# Patient Record
Sex: Female | Born: 1985
Health system: Southern US, Community
[De-identification: ages and names within clinical notes are randomized; demographics above are authoritative.]

## PROBLEM LIST (undated history)

## (undated) ENCOUNTER — Inpatient Hospital Stay (HOSPITAL_COMMUNITY): Payer: Self-pay

## (undated) DIAGNOSIS — D573 Sickle-cell trait: Secondary | ICD-10-CM

## (undated) DIAGNOSIS — O0990 Supervision of high risk pregnancy, unspecified, unspecified trimester: Secondary | ICD-10-CM

## (undated) DIAGNOSIS — E119 Type 2 diabetes mellitus without complications: Secondary | ICD-10-CM

## (undated) DIAGNOSIS — I639 Cerebral infarction, unspecified: Secondary | ICD-10-CM

## (undated) HISTORY — PX: TONSILLECTOMY: SUR1361

## (undated) HISTORY — PX: WISDOM TOOTH EXTRACTION: SHX21

## (undated) HISTORY — DX: Supervision of high risk pregnancy, unspecified, unspecified trimester: O09.90

## (undated) HISTORY — DX: Cerebral infarction, unspecified: I63.9

---
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
## 2005-04-27 ENCOUNTER — Emergency Department (HOSPITAL_COMMUNITY): Admission: EM | Admit: 2005-04-27 | Discharge: 2005-04-27 | Payer: Self-pay | Admitting: Emergency Medicine
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
## 2005-09-16 ENCOUNTER — Emergency Department (HOSPITAL_COMMUNITY): Admission: EM | Admit: 2005-09-16 | Discharge: 2005-09-16 | Payer: Self-pay | Admitting: Emergency Medicine

## 2005-11-04 ENCOUNTER — Emergency Department (HOSPITAL_COMMUNITY): Admission: EM | Admit: 2005-11-04 | Discharge: 2005-11-04 | Payer: Self-pay | Admitting: Emergency Medicine

## 2006-06-03 ENCOUNTER — Emergency Department (HOSPITAL_COMMUNITY): Admission: EM | Admit: 2006-06-03 | Discharge: 2006-06-03 | Payer: Self-pay | Admitting: Emergency Medicine

## 2007-02-25 ENCOUNTER — Emergency Department (HOSPITAL_COMMUNITY): Admission: EM | Admit: 2007-02-25 | Discharge: 2007-02-26 | Payer: Self-pay | Admitting: Emergency Medicine

## 2007-02-25 IMAGING — CR DG CHEST 2V
2 series · 2 of 2 positions shown · non-contrast
Comparison: none

CLINICAL DATA: Chest pain and shortness of breath.
 CHEST - 2 VIEW ? [DATE]:

[w chest pa]
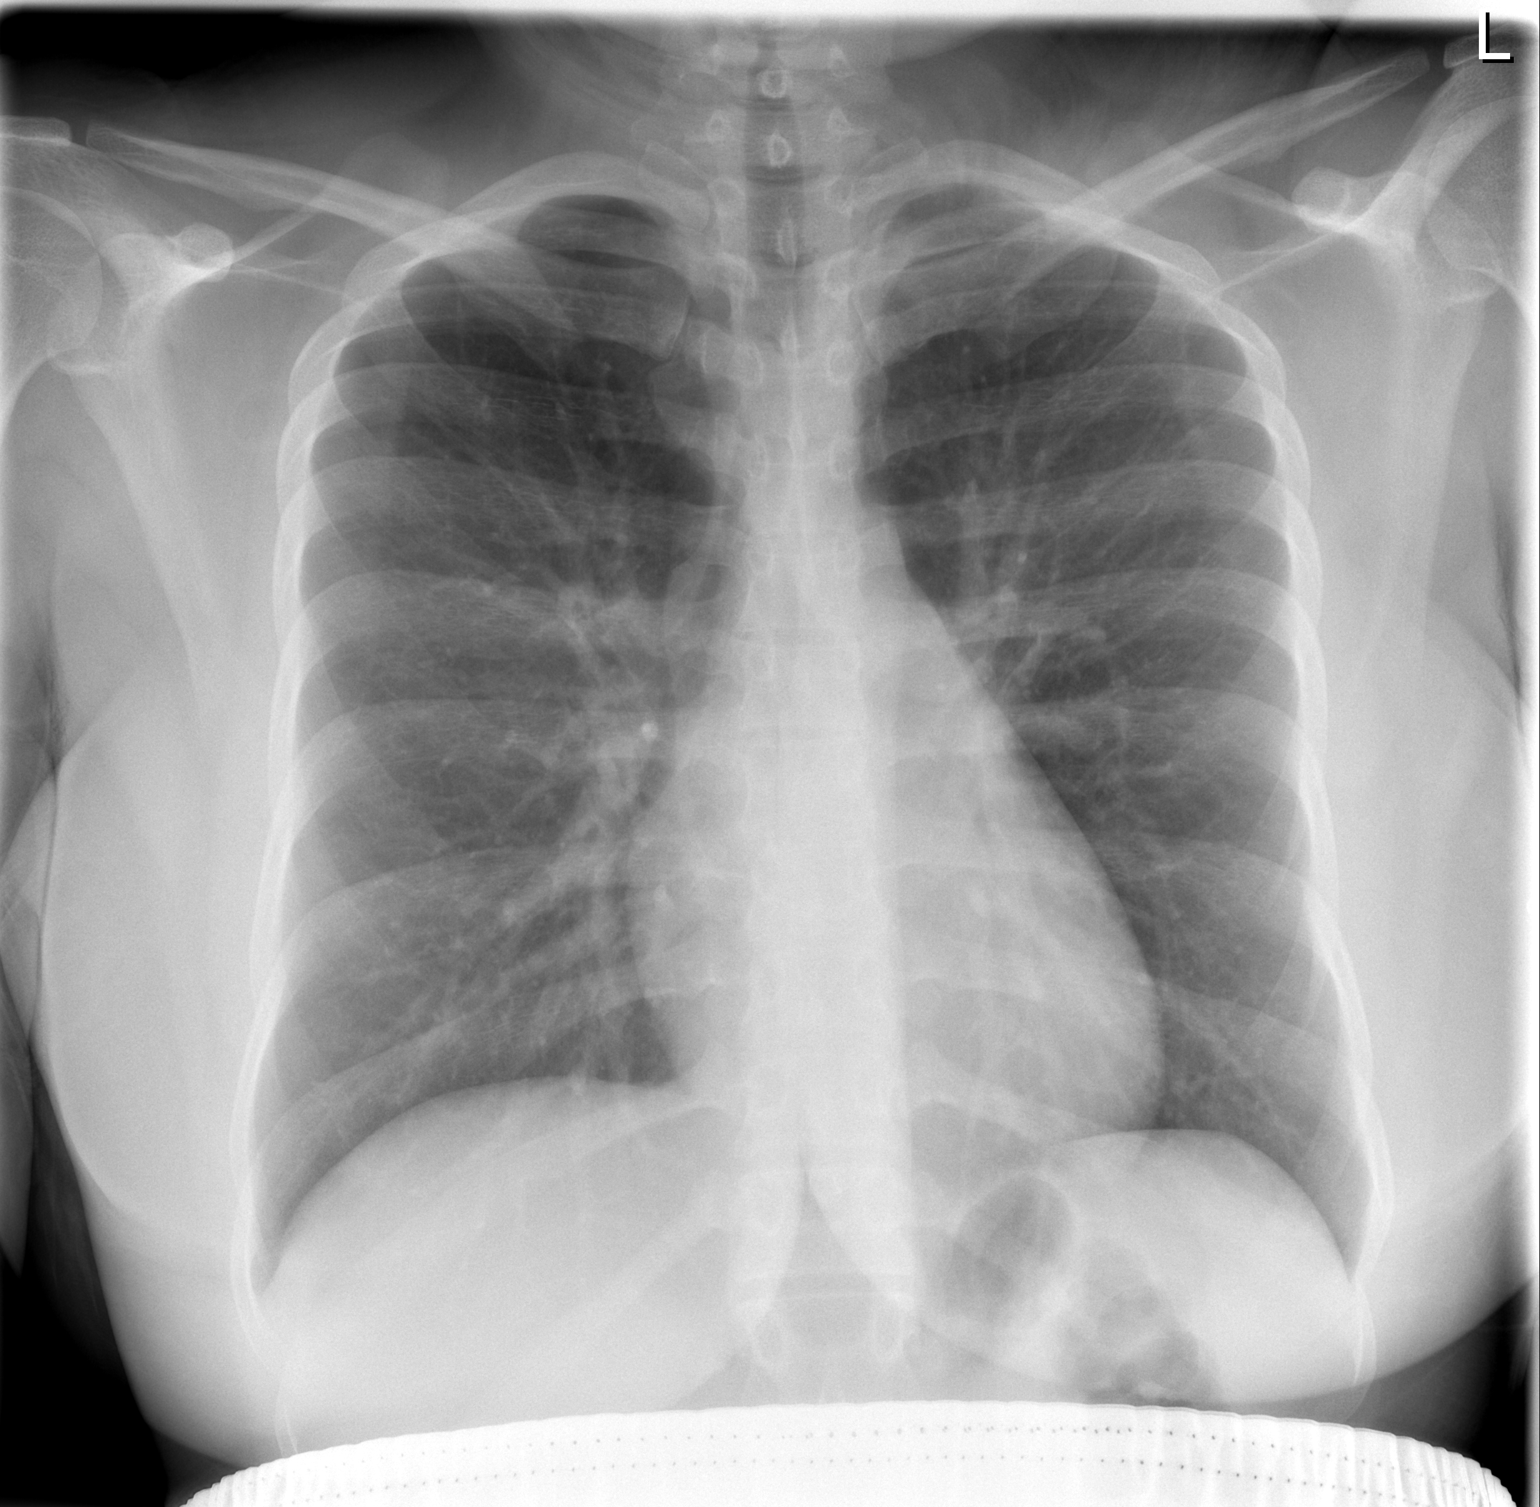

[w chest lat]
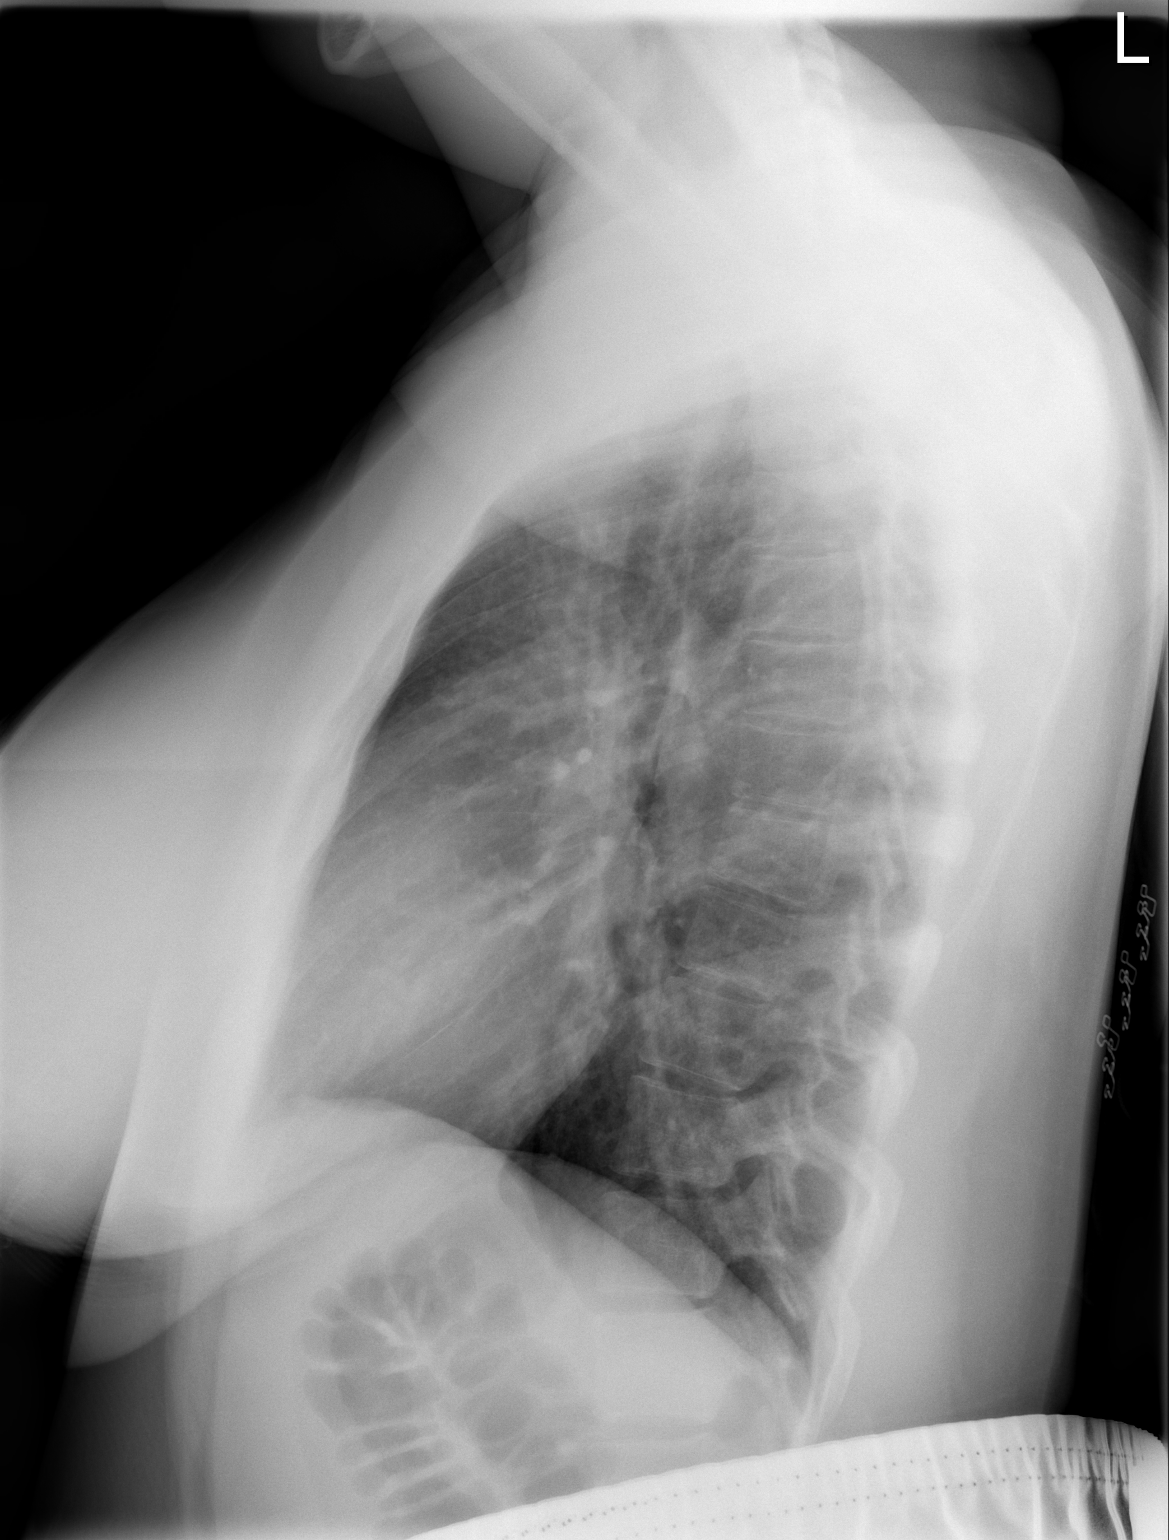

[2 of 2 positions shown; findings below may reference images not displayed]

FINDINGS: The heart size and mediastinal contours are within normal limits.  Both lungs are clear.  The visualized skeletal structures are unremarkable.
IMPRESSION: No active cardiopulmonary disease.

## 2008-12-03 ENCOUNTER — Emergency Department (HOSPITAL_COMMUNITY): Admission: EM | Admit: 2008-12-03 | Discharge: 2008-12-03 | Payer: Self-pay | Admitting: Emergency Medicine

## 2009-09-25 ENCOUNTER — Emergency Department (HOSPITAL_COMMUNITY): Admission: EM | Admit: 2009-09-25 | Discharge: 2009-09-25 | Payer: Self-pay | Admitting: Emergency Medicine

## 2009-09-28 ENCOUNTER — Inpatient Hospital Stay (HOSPITAL_COMMUNITY)
Admission: AD | Admit: 2009-09-28 | Discharge: 2009-09-28 | Payer: Self-pay | Source: Home / Self Care | Admitting: Obstetrics & Gynecology

## 2009-10-06 ENCOUNTER — Inpatient Hospital Stay (HOSPITAL_COMMUNITY): Admission: AD | Admit: 2009-10-06 | Discharge: 2009-10-06 | Payer: Self-pay | Admitting: Obstetrics & Gynecology

## 2009-10-13 ENCOUNTER — Inpatient Hospital Stay (HOSPITAL_COMMUNITY): Admission: AD | Admit: 2009-10-13 | Discharge: 2009-10-13 | Payer: Self-pay | Admitting: Obstetrics and Gynecology

## 2009-10-13 ENCOUNTER — Ambulatory Visit: Payer: Self-pay | Admitting: Gynecology

## 2009-10-18 ENCOUNTER — Ambulatory Visit: Payer: Self-pay | Admitting: Obstetrics and Gynecology

## 2009-10-18 ENCOUNTER — Inpatient Hospital Stay (HOSPITAL_COMMUNITY): Admission: AD | Admit: 2009-10-18 | Discharge: 2009-10-18 | Payer: Self-pay | Admitting: Obstetrics & Gynecology

## 2009-10-22 ENCOUNTER — Ambulatory Visit: Payer: Self-pay | Admitting: Physician Assistant

## 2009-10-22 ENCOUNTER — Inpatient Hospital Stay (HOSPITAL_COMMUNITY): Admission: AD | Admit: 2009-10-22 | Discharge: 2009-10-22 | Payer: Self-pay | Admitting: Obstetrics & Gynecology

## 2009-11-13 ENCOUNTER — Inpatient Hospital Stay (HOSPITAL_COMMUNITY): Admission: AD | Admit: 2009-11-13 | Discharge: 2009-11-13 | Payer: Self-pay | Admitting: Family Medicine

## 2009-11-18 ENCOUNTER — Inpatient Hospital Stay (HOSPITAL_COMMUNITY): Admission: AD | Admit: 2009-11-18 | Discharge: 2009-11-18 | Payer: Self-pay | Admitting: Obstetrics & Gynecology

## 2009-11-18 ENCOUNTER — Ambulatory Visit: Payer: Self-pay | Admitting: Obstetrics & Gynecology

## 2009-11-21 ENCOUNTER — Ambulatory Visit: Payer: Self-pay | Admitting: Nurse Practitioner

## 2009-11-21 ENCOUNTER — Inpatient Hospital Stay (HOSPITAL_COMMUNITY): Admission: AD | Admit: 2009-11-21 | Discharge: 2009-11-21 | Payer: Self-pay | Admitting: Obstetrics & Gynecology

## 2009-12-12 ENCOUNTER — Inpatient Hospital Stay (HOSPITAL_COMMUNITY)
Admission: AD | Admit: 2009-12-12 | Discharge: 2009-12-12 | Payer: Self-pay | Source: Home / Self Care | Admitting: Obstetrics & Gynecology

## 2009-12-22 ENCOUNTER — Ambulatory Visit (HOSPITAL_COMMUNITY)
Admission: RE | Admit: 2009-12-22 | Discharge: 2009-12-22 | Payer: Self-pay | Source: Home / Self Care | Admitting: Obstetrics & Gynecology

## 2009-12-22 IMAGING — US US OB DETAIL+14 WK
2 series · 12 of 28 positions shown · non-contrast
Comparison: none

[Series 1: us ob detail +14 wk · 0.24mm/px · 10 of 56 slices shown (1 of 2)]
[im 3/56]
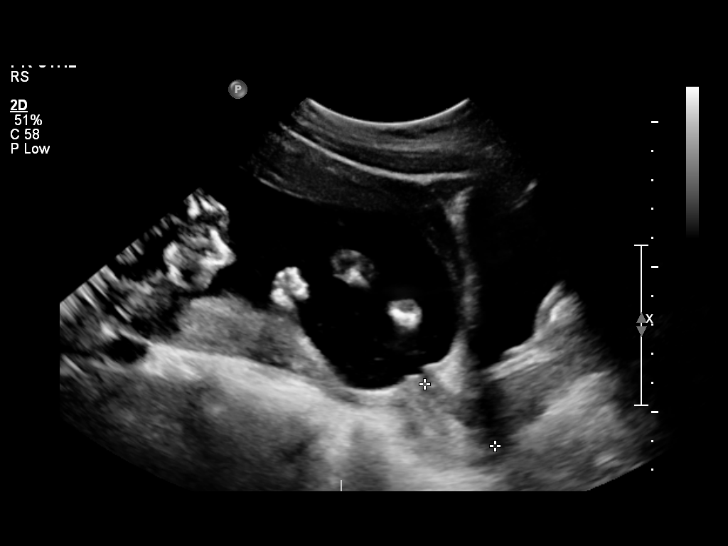
[im 8/56]
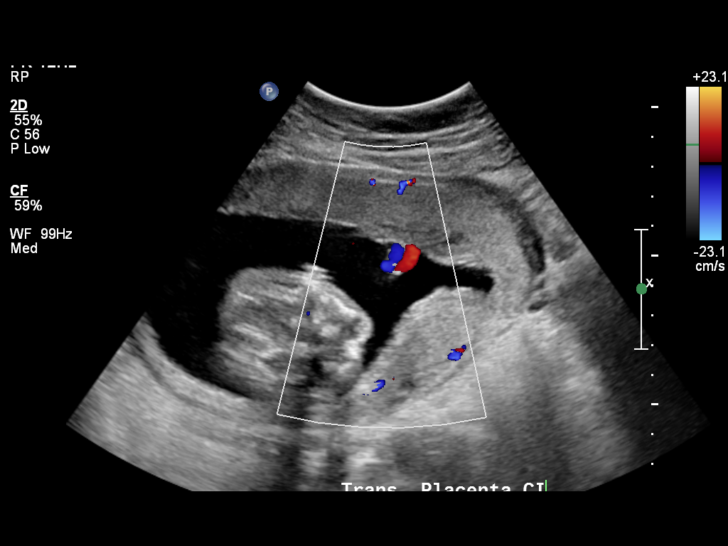
[im 12/56]
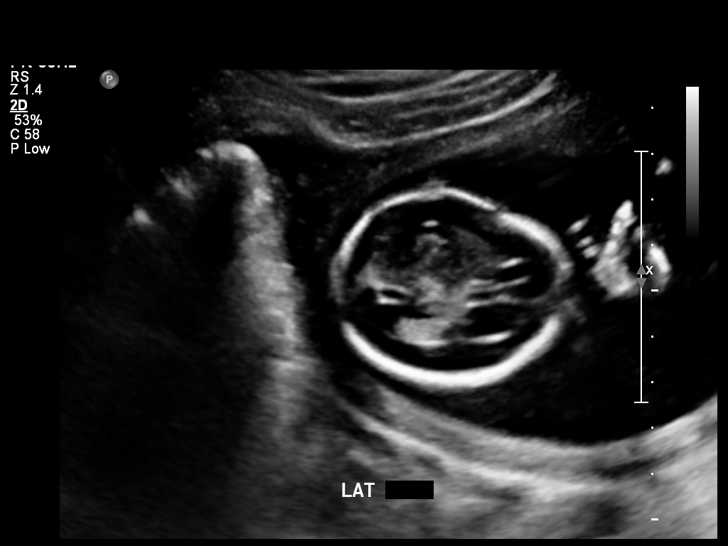
[im 20/56]
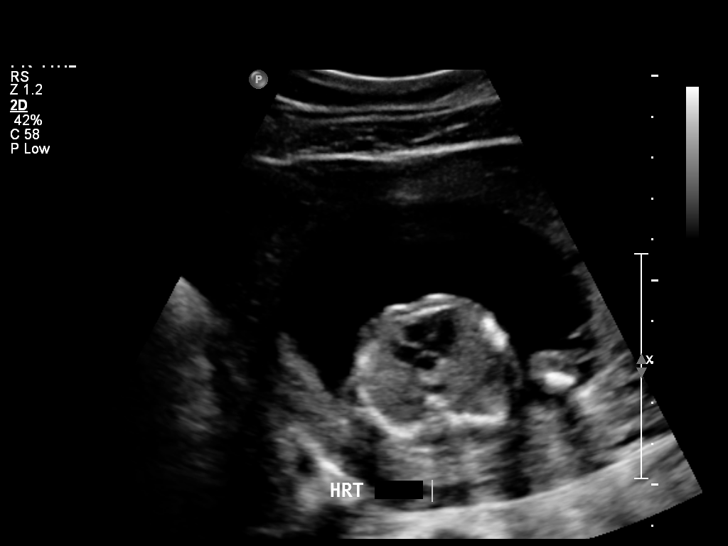
[im 24/56]
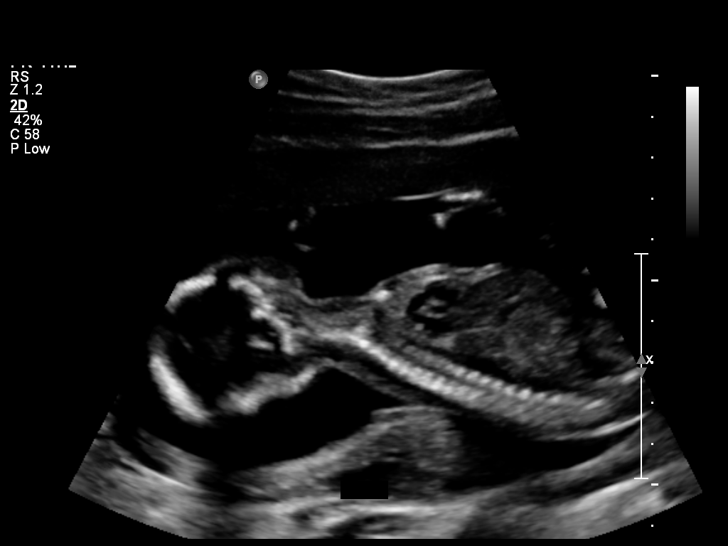
[im 29/56]
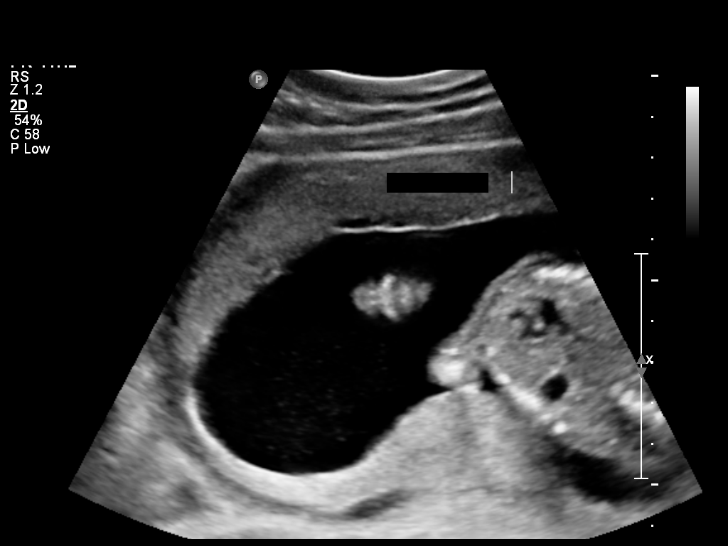
[im 36/56]
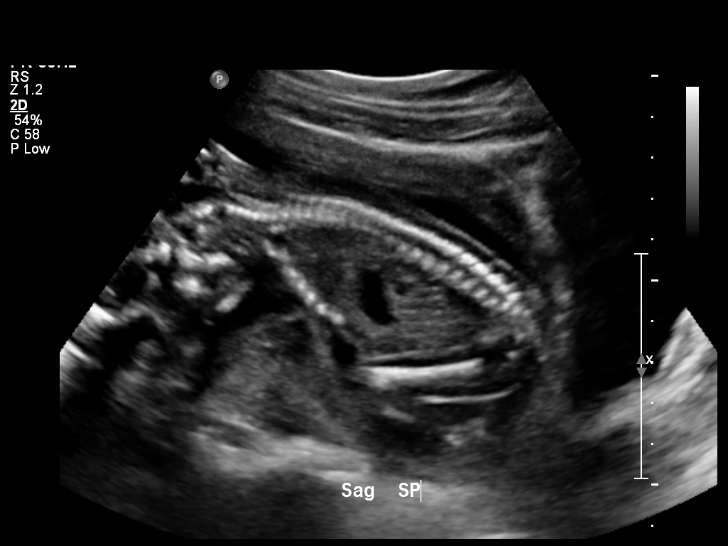
[im 41/56]
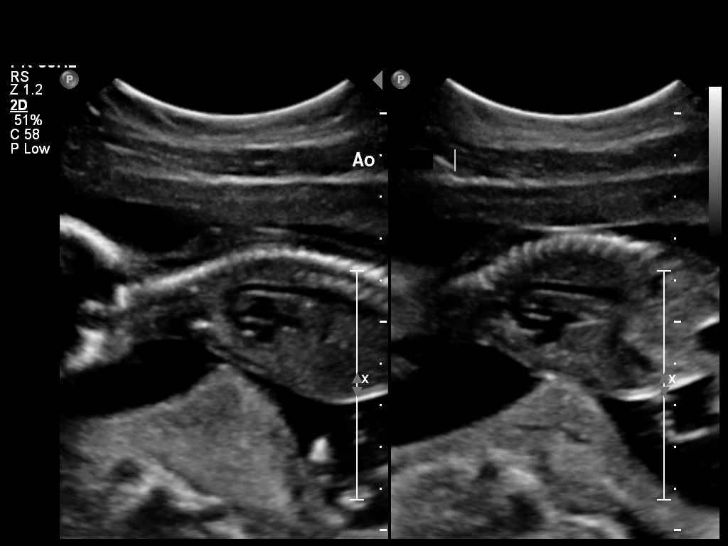
[im 46/56]
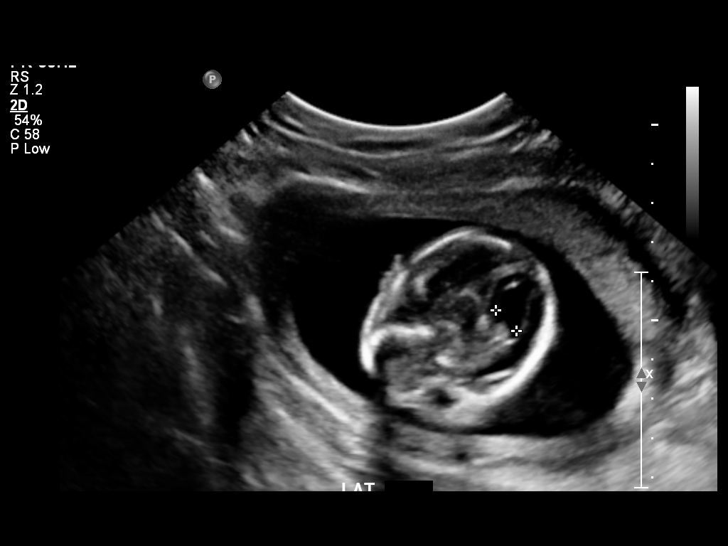
[im 53/56]
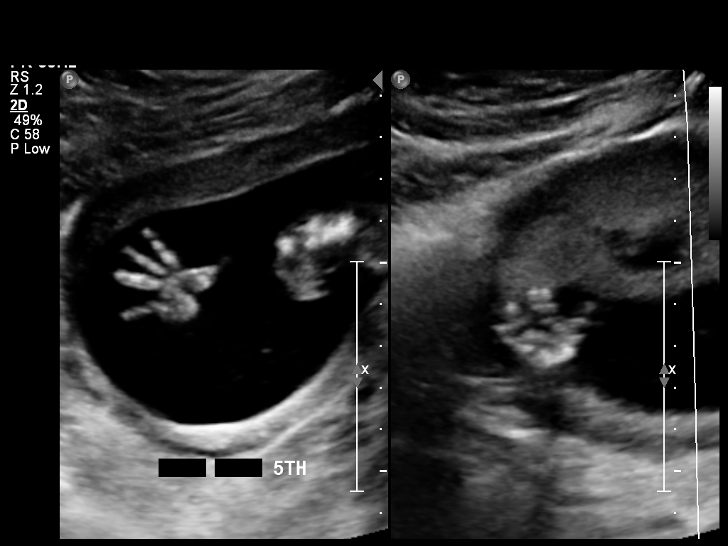

[Series 1: us ob detail +14 wk · 0.21mm/px · 2 of 9 slices shown (2 of 2)]
[im 1/9]
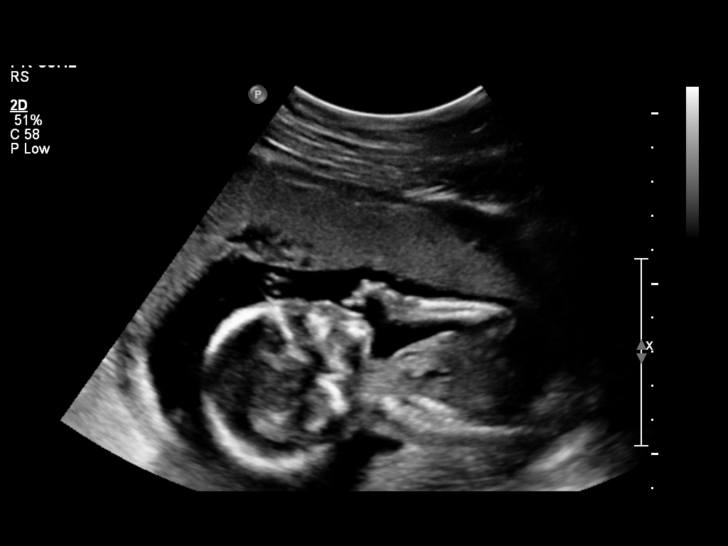
[im 6/9]
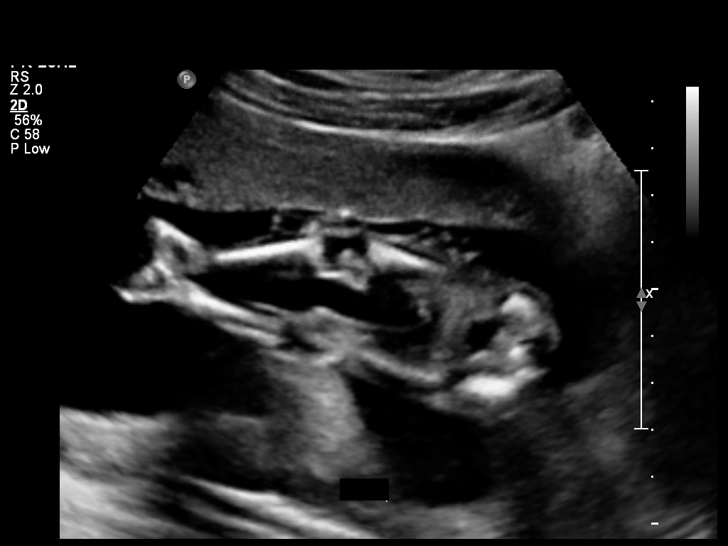

[12 of 28 positions shown; findings below may reference images not displayed]

OBSTETRICS REPORT
                      (Signed Final [DATE] [DATE])

           CHAMBERS

                 [REDACTED]-
                 Faculty Physician
 Order#:         _O
Procedures

 US OB DETAIL +14 WK                                   76811.0
Indications

 Detailed fetal anatomic survey                        655.83 [YS]
Fetal Evaluation

 Fetal Heart Rate:  143                          bpm
 Cardiac Activity:  Observed
 Presentation:      Breech
 Placenta:          Anterior, above cervical os
 P. Cord            Visualized
 Insertion:

 Amniotic Fluid
 AFI FV:      Subjectively within normal limits
                                             Larg Pckt:     5.3  cm
Biometry

 BPD:     40.3  mm     G. Age:  18w 1d                CI:        68.68   70 - 86
                                                      FL/HC:      17.1   16.1 -

 HC:     155.4  mm     G. Age:  18w 3d       38  %    HC/AC:      1.22   1.09 -

 AC:     127.4  mm     G. Age:  18w 2d       39  %    FL/BPD:
 FL:      26.5  mm     G. Age:  18w 0d       26  %    FL/AC:      20.8   20 - 24
 HUM:     26.5  mm     G. Age:  18w 2d       48  %
 NFT:     2.52  mm
 Est. FW:     230  gm      0 lb 8 oz     39  %
Gestational Age

 LMP:           18w 4d        Date:  [DATE]                 EDD:   [DATE]
 U/S Today:     18w 2d                                        EDD:   [DATE]
 Best:          18w 4d     Det. By:  LMP  ([DATE])          EDD:   [DATE]
Genetic Sonogram - Trisomy 21 Screening

 Age:                                             24          Risk=1:   885
 Echogenic bowel:                                 No          LR :
 Hypoplastic/absent Nasal bone:                   No
 Choroid plexus cysts:                            No
 Structural anomalies (inc. cardiac):             No          LR :
 Hypoplastic / absent midphalanx 5th Digit:       No
 Short femur:                                     No          LR :
 Short humerus:                                   No          LR :
 2-vessel umbilical cord:                         No
 Pyelectasis:                                     No          LR :
 Echogenic cardiac foci:                          No          LR :
 Nuchal fold thickening >= 6 mm:                  No          LR :

 11 Of 11 Criteria Were Visualized and 0 Abnormal(s) Were Seen.
 Ultrasound Modified Risk for Fetal Down Syndrome = [DATE]
Anatomy

 Cranium:           Appears normal      Aortic Arch:       Appears normal
 Fetal Cavum:       Appears normal      Ductal Arch:       Appears normal
 Ventricles:        Appears normal      Diaphragm:         Appears normal
 Choroid Plexus:    Appears normal      Stomach:           Appears normal
 Cerebellum:        Appears normal      Abdomen:           Appears normal
 Posterior Fossa:   Appears normal      Abdominal Wall:    Appears nml
                                                           (cord insert,
                                                           abd wall)
 Nuchal Fold:       Appears normal      Cord Vessels:      Appears normal
                    (neck, nuchal                          (3 vessel cord)
                    fold)
 Face:              Appears normal      Kidneys:           Appear normal
                    (lips/profile/orbit
                    s)
 Heart:             Appears normal      Bladder:           Appears normal
                    (4 chamber &
                    axis)
 RVOT:              Appears normal      Spine:             Appears normal
 LVOT:              Appears normal      Limbs:             Appears normal
                                                           (hands, ankles,
                                                           feet)

 Other:     Female gender. Heels and 5th digit visualized. Nasal
            bone visualized.
Cervix Uterus Adnexa

 Cervical Length:    3.22     cm

 Cervix:       Closed.

 Adnexa:     No abnormality visualized.
Impression

 Single living IUP.  US EGA is concordant with LMP.
 No fetal anomalies seen involving visualized anatomy.
 No sonographic markers for aneuploidy visualized.

## 2010-03-01 ENCOUNTER — Inpatient Hospital Stay (HOSPITAL_COMMUNITY)
Admission: AD | Admit: 2010-03-01 | Discharge: 2010-03-01 | Disposition: A | Discharge status: Home / Self Care | Payer: Medicaid Other | Source: Ambulatory Visit | Attending: Obstetrics and Gynecology | Admitting: Obstetrics and Gynecology

## 2010-03-01 DIAGNOSIS — B3731 Acute candidiasis of vulva and vagina: Secondary | ICD-10-CM | POA: Insufficient documentation

## 2010-03-01 DIAGNOSIS — N390 Urinary tract infection, site not specified: Secondary | ICD-10-CM | POA: Insufficient documentation

## 2010-03-01 DIAGNOSIS — N949 Unspecified condition associated with female genital organs and menstrual cycle: Secondary | ICD-10-CM | POA: Insufficient documentation

## 2010-03-01 DIAGNOSIS — O2390 Unspecified genitourinary tract infection in pregnancy, unspecified trimester: Secondary | ICD-10-CM | POA: Insufficient documentation

## 2010-03-01 DIAGNOSIS — B373 Candidiasis of vulva and vagina: Secondary | ICD-10-CM | POA: Insufficient documentation

## 2010-03-01 LAB — URINALYSIS, ROUTINE W REFLEX MICROSCOPIC
Bilirubin Urine: NEGATIVE
Ketones, ur: NEGATIVE mg/dL
Nitrite: NEGATIVE
Protein, ur: NEGATIVE mg/dL
Specific Gravity, Urine: 1.02 (ref 1.005–1.030)
Urine Glucose, Fasting: NEGATIVE mg/dL
Urobilinogen, UA: 0.2 mg/dL (ref 0.0–1.0)
pH: 7 (ref 5.0–8.0)

## 2010-03-01 LAB — WET PREP, GENITAL
Clue Cells Wet Prep HPF POC: NONE SEEN
Trich, Wet Prep: NONE SEEN
Yeast Wet Prep HPF POC: NONE SEEN

## 2010-03-01 LAB — URINE MICROSCOPIC-ADD ON

## 2010-03-02 LAB — URINE CULTURE
Colony Count: 15000
Culture  Setup Time: 201202071627

## 2010-03-02 LAB — GC/CHLAMYDIA PROBE AMP, GENITAL
Chlamydia, DNA Probe: NEGATIVE
GC Probe Amp, Genital: NEGATIVE

## 2010-03-21 ENCOUNTER — Encounter: Payer: Medicaid Other | Attending: Obstetrics & Gynecology | Admitting: Dietician

## 2010-03-21 ENCOUNTER — Other Ambulatory Visit: Payer: Self-pay

## 2010-03-21 DIAGNOSIS — O99330 Smoking (tobacco) complicating pregnancy, unspecified trimester: Secondary | ICD-10-CM

## 2010-03-21 DIAGNOSIS — O99340 Other mental disorders complicating pregnancy, unspecified trimester: Secondary | ICD-10-CM

## 2010-03-21 DIAGNOSIS — Z713 Dietary counseling and surveillance: Secondary | ICD-10-CM | POA: Insufficient documentation

## 2010-03-21 DIAGNOSIS — O99810 Abnormal glucose complicating pregnancy: Secondary | ICD-10-CM | POA: Insufficient documentation

## 2010-03-21 DIAGNOSIS — O358XX0 Maternal care for other (suspected) fetal abnormality and damage, not applicable or unspecified: Secondary | ICD-10-CM

## 2010-03-21 LAB — POCT URINALYSIS DIPSTICK
Bilirubin Urine: NEGATIVE
Ketones, ur: NEGATIVE mg/dL
Nitrite: NEGATIVE
Protein, ur: NEGATIVE mg/dL
Specific Gravity, Urine: 1.025 (ref 1.005–1.030)
Urine Glucose, Fasting: NEGATIVE mg/dL
Urobilinogen, UA: 0.2 mg/dL (ref 0.0–1.0)
pH: 7 (ref 5.0–8.0)

## 2010-03-22 ENCOUNTER — Encounter: Payer: Self-pay | Admitting: Obstetrics & Gynecology

## 2010-04-04 ENCOUNTER — Other Ambulatory Visit: Payer: Self-pay

## 2010-04-04 DIAGNOSIS — O99810 Abnormal glucose complicating pregnancy: Secondary | ICD-10-CM

## 2010-04-04 LAB — POCT URINALYSIS DIPSTICK
Bilirubin Urine: NEGATIVE
Glucose, UA: NEGATIVE mg/dL
Nitrite: NEGATIVE
Protein, ur: NEGATIVE mg/dL
Specific Gravity, Urine: 1.025 (ref 1.005–1.030)
Urobilinogen, UA: 0.2 mg/dL (ref 0.0–1.0)
pH: 6.5 (ref 5.0–8.0)

## 2010-04-05 LAB — HERPES SIMPLEX VIRUS CULTURE: Culture: NOT DETECTED

## 2010-04-06 LAB — CBC
HCT: 32.7 % — ABNORMAL LOW (ref 36.0–46.0)
Hemoglobin: 11.1 g/dL — ABNORMAL LOW (ref 12.0–15.0)
MCH: 31.5 pg (ref 26.0–34.0)
MCHC: 33.9 g/dL (ref 30.0–36.0)
MCV: 93 fL (ref 78.0–100.0)
Platelets: 449 10*3/uL — ABNORMAL HIGH (ref 150–400)
RBC: 3.51 MIL/uL — ABNORMAL LOW (ref 3.87–5.11)
RDW: 14 % (ref 11.5–15.5)
WBC: 10.2 10*3/uL (ref 4.0–10.5)

## 2010-04-06 LAB — URINALYSIS, ROUTINE W REFLEX MICROSCOPIC
Bilirubin Urine: NEGATIVE
Glucose, UA: NEGATIVE mg/dL
Glucose, UA: NEGATIVE mg/dL
Hgb urine dipstick: NEGATIVE
Hgb urine dipstick: NEGATIVE
Ketones, ur: 15 mg/dL — AB
Ketones, ur: NEGATIVE mg/dL
Protein, ur: NEGATIVE mg/dL
Protein, ur: NEGATIVE mg/dL
Specific Gravity, Urine: 1.025 (ref 1.005–1.030)
Specific Gravity, Urine: >1.03 — ABNORMAL HIGH (ref 1.005–1.030)
Urobilinogen, UA: 0.2 mg/dL (ref 0.0–1.0)
Urobilinogen, UA: 0.2 mg/dL (ref 0.0–1.0)
pH: 6 (ref 5.0–8.0)

## 2010-04-06 LAB — WET PREP, GENITAL
Clue Cells Wet Prep HPF POC: NONE SEEN
Trich, Wet Prep: NONE SEEN

## 2010-04-06 LAB — GC/CHLAMYDIA PROBE AMP, GENITAL
Chlamydia, DNA Probe: NEGATIVE
GC Probe Amp, Genital: NEGATIVE

## 2010-04-07 LAB — URINALYSIS, ROUTINE W REFLEX MICROSCOPIC
Bilirubin Urine: NEGATIVE
Bilirubin Urine: NEGATIVE
Glucose, UA: NEGATIVE mg/dL
Glucose, UA: NEGATIVE mg/dL
Hgb urine dipstick: NEGATIVE
Ketones, ur: 40 mg/dL — AB
Ketones, ur: >80 mg/dL — AB
Leukocytes, UA: NEGATIVE
Nitrite: NEGATIVE
Nitrite: NEGATIVE
Nitrite: NEGATIVE
Protein, ur: 30 mg/dL — AB
Protein, ur: NEGATIVE mg/dL
Protein, ur: NEGATIVE mg/dL
Specific Gravity, Urine: >1.03 — ABNORMAL HIGH (ref 1.005–1.030)
Specific Gravity, Urine: 1.025 (ref 1.005–1.030)
Urobilinogen, UA: 0.2 mg/dL (ref 0.0–1.0)
Urobilinogen, UA: 4 mg/dL — ABNORMAL HIGH (ref 0.0–1.0)
Urobilinogen, UA: 0.2 mg/dL (ref 0.0–1.0)
pH: 6 (ref 5.0–8.0)

## 2010-04-07 LAB — WET PREP, GENITAL: Yeast Wet Prep HPF POC: NONE SEEN

## 2010-04-07 LAB — POCT PREGNANCY, URINE: Preg Test, Ur: POSITIVE

## 2010-04-07 LAB — COMPREHENSIVE METABOLIC PANEL
ALT: 35 U/L (ref 0–35)
Alkaline Phosphatase: 53 U/L (ref 39–117)
CO2: 24 mEq/L (ref 19–32)
GFR calc non Af Amer: >60 mL/min (ref >60)
Glucose, Bld: 76 mg/dL (ref 70–99)
Potassium: 4 mEq/L (ref 3.5–5.1)
Sodium: 134 mEq/L — ABNORMAL LOW (ref 135–145)
Total Bilirubin: 0.7 mg/dL (ref 0.3–1.2)

## 2010-04-07 LAB — CBC
Hemoglobin: 12.1 g/dL (ref 12.0–15.0)
MCH: 30.9 pg (ref 26.0–34.0)
MCHC: 34 g/dL (ref 30.0–36.0)
MCV: 90.8 fL (ref 78.0–100.0)
RDW: 12.2 % (ref 11.5–15.5)
WBC: 8.4 10*3/uL (ref 4.0–10.5)

## 2010-04-07 LAB — GC/CHLAMYDIA PROBE AMP, GENITAL
Chlamydia, DNA Probe: NEGATIVE
GC Probe Amp, Genital: NEGATIVE

## 2010-04-07 LAB — URINE MICROSCOPIC-ADD ON

## 2010-04-09 ENCOUNTER — Inpatient Hospital Stay (HOSPITAL_COMMUNITY)
Admission: AD | Admit: 2010-04-09 | Discharge: 2010-04-09 | Disposition: A | Discharge status: Home / Self Care | Payer: Medicaid Other | Source: Ambulatory Visit | Attending: Obstetrics & Gynecology | Admitting: Obstetrics & Gynecology

## 2010-04-09 DIAGNOSIS — O212 Late vomiting of pregnancy: Secondary | ICD-10-CM | POA: Insufficient documentation

## 2010-04-09 DIAGNOSIS — K219 Gastro-esophageal reflux disease without esophagitis: Secondary | ICD-10-CM | POA: Insufficient documentation

## 2010-04-09 DIAGNOSIS — O99891 Other specified diseases and conditions complicating pregnancy: Secondary | ICD-10-CM | POA: Insufficient documentation

## 2010-04-11 ENCOUNTER — Encounter: Payer: Self-pay | Admitting: Obstetrics & Gynecology

## 2010-04-11 ENCOUNTER — Other Ambulatory Visit: Payer: Self-pay

## 2010-04-11 DIAGNOSIS — O99810 Abnormal glucose complicating pregnancy: Secondary | ICD-10-CM

## 2010-04-11 LAB — POCT URINALYSIS DIP (DEVICE)
Bilirubin Urine: NEGATIVE
Nitrite: NEGATIVE
Protein, ur: NEGATIVE mg/dL
Urobilinogen, UA: 0.2 mg/dL (ref 0.0–1.0)
pH: 6 (ref 5.0–8.0)

## 2010-04-18 ENCOUNTER — Encounter: Payer: Self-pay | Admitting: Advanced Practice Midwife

## 2010-04-18 ENCOUNTER — Other Ambulatory Visit: Payer: Self-pay | Admitting: Obstetrics and Gynecology

## 2010-04-18 DIAGNOSIS — Z331 Pregnant state, incidental: Secondary | ICD-10-CM

## 2010-04-18 DIAGNOSIS — O99810 Abnormal glucose complicating pregnancy: Secondary | ICD-10-CM

## 2010-04-18 DIAGNOSIS — O99330 Smoking (tobacco) complicating pregnancy, unspecified trimester: Secondary | ICD-10-CM

## 2010-04-18 LAB — POCT URINALYSIS DIP (DEVICE)
Ketones, ur: NEGATIVE mg/dL
Nitrite: NEGATIVE
Protein, ur: NEGATIVE mg/dL
Urobilinogen, UA: 0.2 mg/dL (ref 0.0–1.0)

## 2010-04-22 ENCOUNTER — Inpatient Hospital Stay (HOSPITAL_COMMUNITY)
Admission: AD | Admit: 2010-04-22 | Discharge: 2010-04-22 | Disposition: A | Discharge status: Home / Self Care | Payer: Medicaid Other | Source: Ambulatory Visit | Attending: Obstetrics & Gynecology | Admitting: Obstetrics & Gynecology

## 2010-04-22 DIAGNOSIS — N949 Unspecified condition associated with female genital organs and menstrual cycle: Secondary | ICD-10-CM | POA: Insufficient documentation

## 2010-04-22 DIAGNOSIS — A5901 Trichomonal vulvovaginitis: Secondary | ICD-10-CM | POA: Insufficient documentation

## 2010-04-22 DIAGNOSIS — O98819 Other maternal infectious and parasitic diseases complicating pregnancy, unspecified trimester: Secondary | ICD-10-CM

## 2010-04-22 LAB — URINALYSIS, ROUTINE W REFLEX MICROSCOPIC
Ketones, ur: NEGATIVE mg/dL
Nitrite: NEGATIVE
Protein, ur: NEGATIVE mg/dL
Urobilinogen, UA: 0.2 mg/dL (ref 0.0–1.0)

## 2010-05-02 ENCOUNTER — Other Ambulatory Visit: Payer: Self-pay | Admitting: Family Medicine

## 2010-05-02 ENCOUNTER — Other Ambulatory Visit: Payer: Self-pay | Admitting: Obstetrics and Gynecology

## 2010-05-02 DIAGNOSIS — O99810 Abnormal glucose complicating pregnancy: Secondary | ICD-10-CM

## 2010-05-02 DIAGNOSIS — O24419 Gestational diabetes mellitus in pregnancy, unspecified control: Secondary | ICD-10-CM

## 2010-05-02 DIAGNOSIS — O99330 Smoking (tobacco) complicating pregnancy, unspecified trimester: Secondary | ICD-10-CM

## 2010-05-02 DIAGNOSIS — Z331 Pregnant state, incidental: Secondary | ICD-10-CM

## 2010-05-02 LAB — POCT URINALYSIS DIP (DEVICE)
Bilirubin Urine: NEGATIVE
Glucose, UA: NEGATIVE mg/dL
Ketones, ur: NEGATIVE mg/dL
pH: 6.5 (ref 5.0–8.0)

## 2010-05-09 ENCOUNTER — Ambulatory Visit (HOSPITAL_COMMUNITY)
Admission: RE | Admit: 2010-05-09 | Discharge: 2010-05-09 | Disposition: A | Discharge status: Home / Self Care | Payer: Medicaid Other | Source: Ambulatory Visit | Attending: Family Medicine | Admitting: Family Medicine

## 2010-05-09 DIAGNOSIS — O24419 Gestational diabetes mellitus in pregnancy, unspecified control: Secondary | ICD-10-CM

## 2010-05-09 DIAGNOSIS — O99810 Abnormal glucose complicating pregnancy: Secondary | ICD-10-CM | POA: Insufficient documentation

## 2010-05-09 DIAGNOSIS — Z331 Pregnant state, incidental: Secondary | ICD-10-CM

## 2010-05-09 IMAGING — US US OB FOLLOW-UP
1 series · 12 of 28 positions shown · non-contrast
Comparison: none

[Series 1: us ob follow up · 12 of 41 slices shown]
[im 2/41]
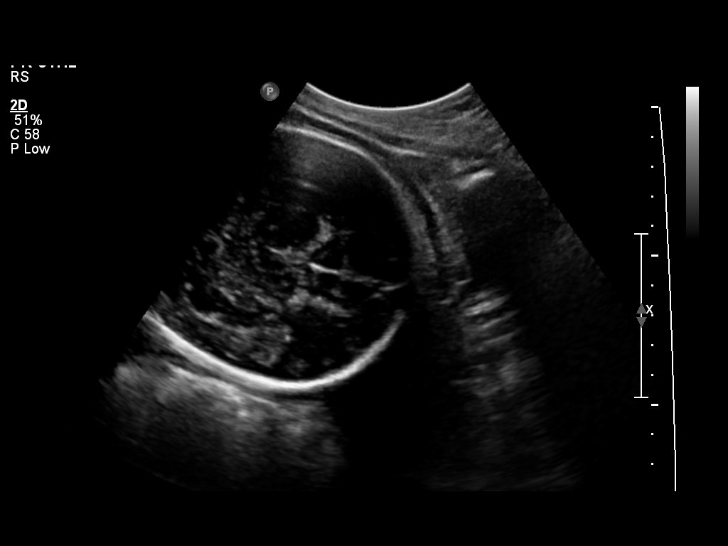
[im 5/41]
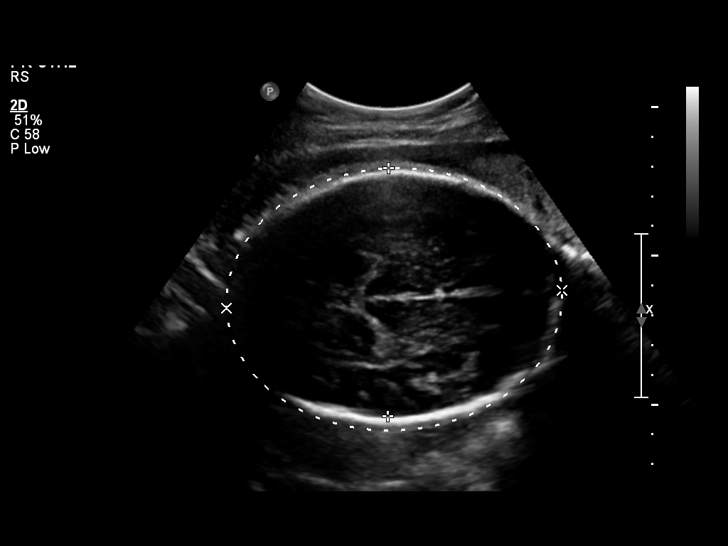
[im 8/41]
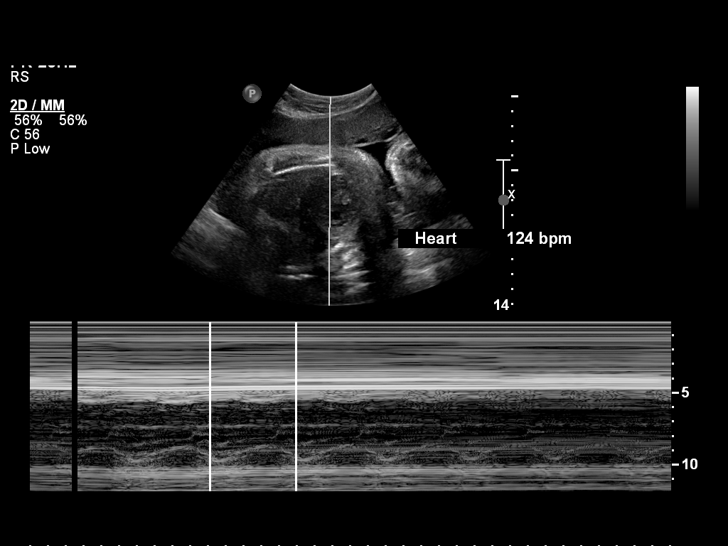
[im 12/41]
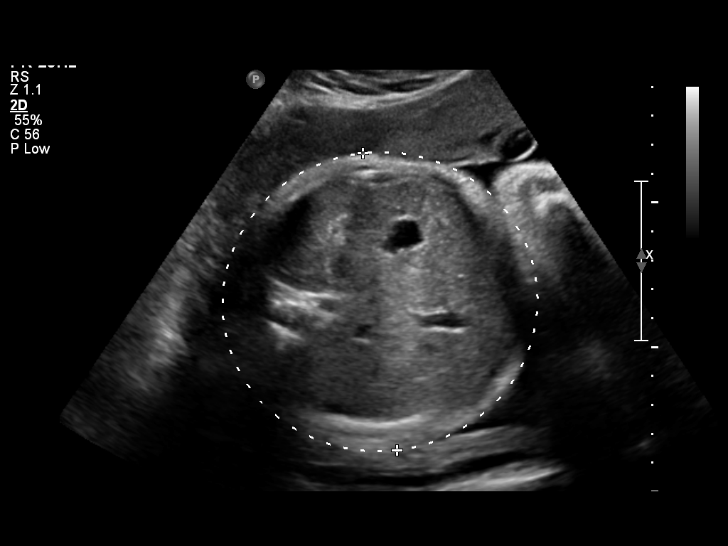
[im 15/41]
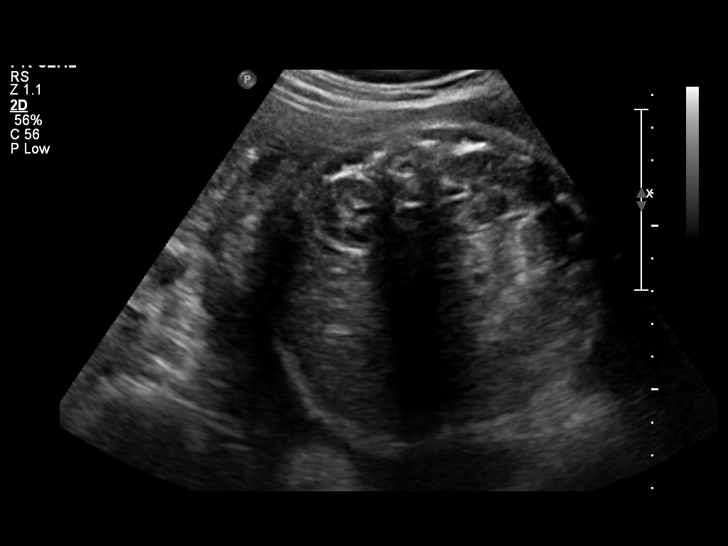
[im 18/41]
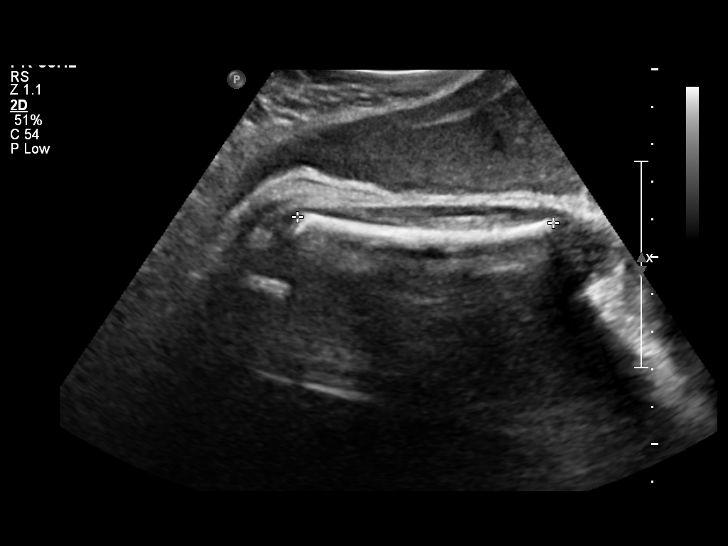
[im 23/41]
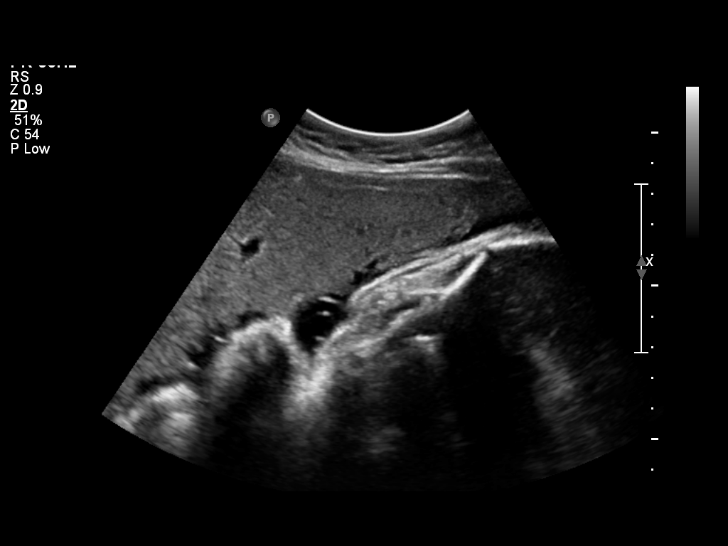
[im 26/41]
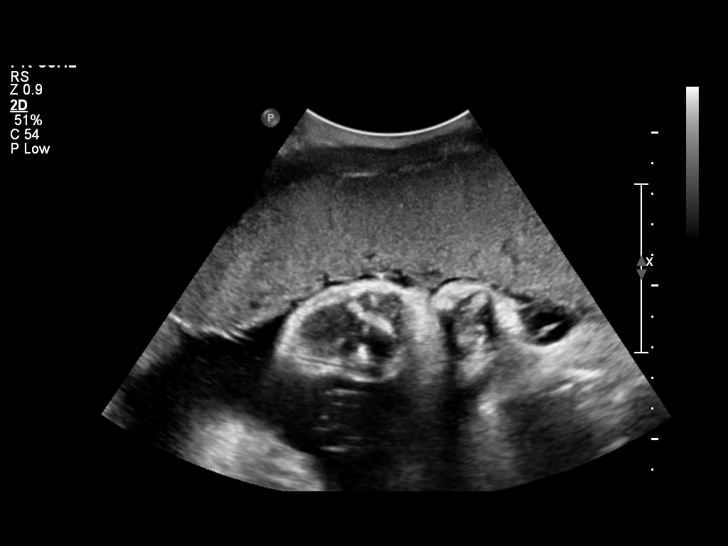
[im 29/41]
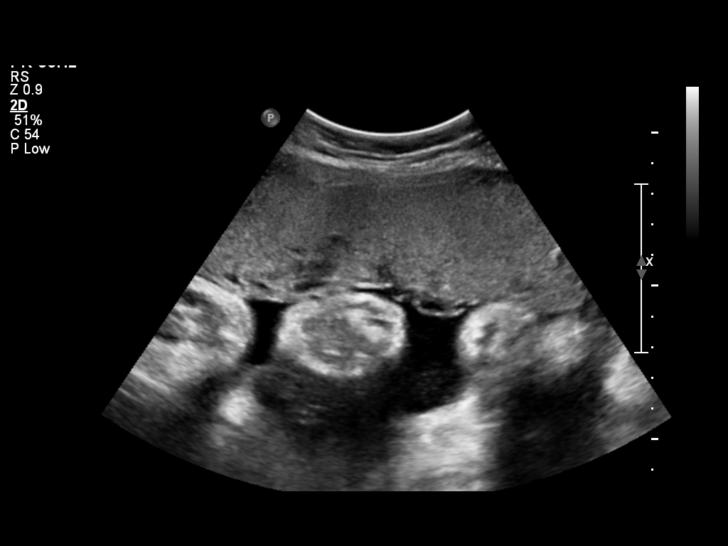
[im 33/41]
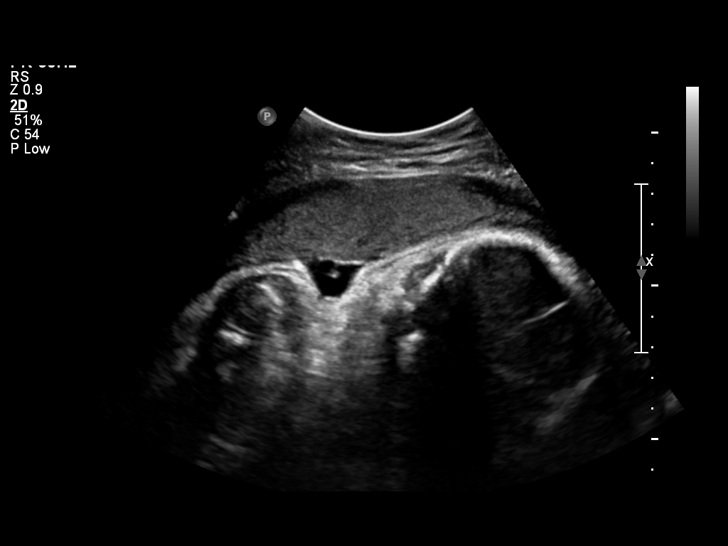
[im 36/41]
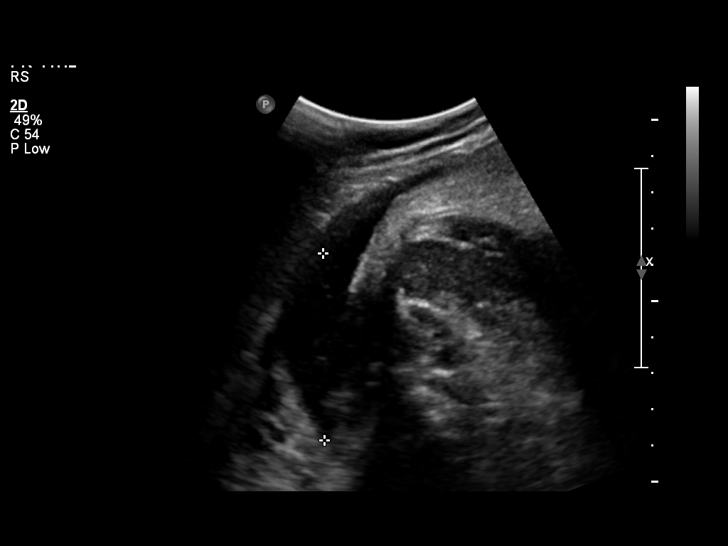
[im 39/41]
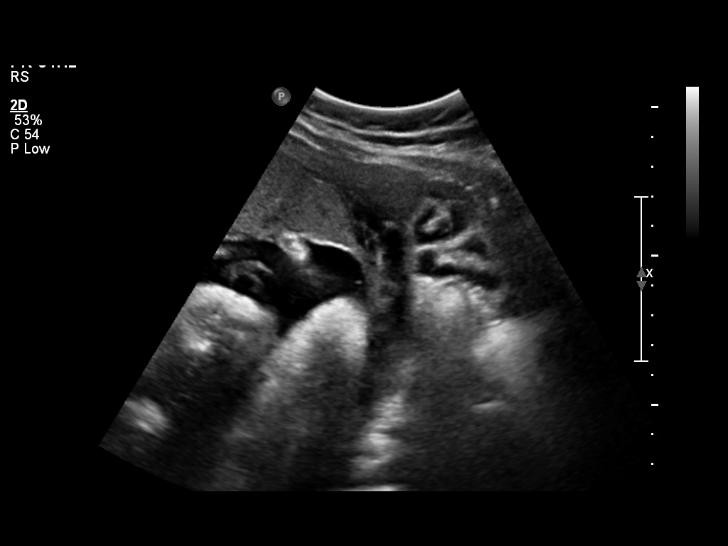

[12 of 28 positions shown; findings below may reference images not displayed]

OBSTETRICS REPORT
                      (Signed Final [DATE] [DATE])

           CHAMBERS

                 [facility]-
                 Faculty Physician
 Order#:         [PHONE_NUMBER]_O
Procedures

 US OB FOLLOW UP                                       76816.1
Indications

 Assess Fetal Growth / Estimated Fetal Weight
 Diabetes - Gestational, A1
Fetal Evaluation

 Fetal Heart Rate:  124                          bpm
 Cardiac Activity:  Observed
 Presentation:      Cephalic
 Placenta:          Anterior, above cervical os
 P. Cord            Previously Visualized
 Insertion:

 Amniotic Fluid
 AFI FV:      Subjectively within normal limits
 AFI Sum:     14.33   cm       55  %Tile     Larg Pckt:     5.8  cm
 RUQ:   5.13    cm   RLQ:    5.8    cm    LUQ:   1.92    cm   LLQ:    1.48   cm
Biometry

 BPD:     83.5  mm     G. Age:  33w 4d                CI:        70.48   70 - 86
                                                      FL/HC:      21.3   20.9 -

 HC:     317.1  mm     G. Age:  35w 4d      < 3  %    HC/AC:      0.96   0.92 -

 AC:     331.5  mm     G. Age:  37w 0d       34  %    FL/BPD:     81.1   71 - 87
 FL:      67.7  mm     G. Age:  34w 5d      < 3  %    FL/AC:      20.4   20 - 24
 Est. FW:    [NX]  gm      6 lb 3 oz     25  %
Gestational Age

 LMP:           38w 2d        Date:  [DATE]                 EDD:   [DATE]
 U/S Today:     35w 1d                                        EDD:   [DATE]
 Best:          38w 2d     Det. By:  LMP  ([DATE])          EDD:   [DATE]
Anatomy

 Cranium:           Appears normal      Aortic Arch:       Previously seen
 Fetal Cavum:       Previously seen     Ductal Arch:       Previously seen
 Ventricles:        Appears normal      Diaphragm:         Previously seen
 Choroid Plexus:    Previously seen     Stomach:           Appears normal
 Cerebellum:        Previously seen     Abdomen:           Appears normal
 Posterior Fossa:   Previously seen     Abdominal Wall:    Previously seen
 Nuchal Fold:       Previously seen     Cord Vessels:      Previously seen
 Face:              Previously seen     Kidneys:           Appear normal
 Heart:             Previously seen     Bladder:           Appears normal
 RVOT:              Previously seen     Spine:             Previously seen
 LVOT:              Previously seen     Limbs:             Previously seen

 Other:     Fetus appears to be a female.Heels and 5th digit
            previously visualized. Nasal bone previously visualized.
Cervix Uterus Adnexa

 Cervix:       Not visualized (advanced GA >34 wks)
 Uterus:       No abnormality visualized.
 Cul De Sac:   No free fluid seen.
 Left Ovary:    Not visualized.
 Right Ovary:   Not visualized.

 Adnexa:     No abnormality visualized.
Impression

 Assigned GA is currently 38w 2d.   Mild downward trend of
 fetal growth curve noted, with EFW currently at  25 %ile.
 Amniotic fluid within normal limits, with AFI of 14.33 cm.

## 2010-05-10 ENCOUNTER — Inpatient Hospital Stay (HOSPITAL_COMMUNITY)
Admission: AD | Admit: 2010-05-10 | Discharge: 2010-05-10 | Disposition: A | Discharge status: Home / Self Care | Payer: Medicaid Other | Source: Ambulatory Visit | Attending: Obstetrics & Gynecology | Admitting: Obstetrics & Gynecology

## 2010-05-10 DIAGNOSIS — O212 Late vomiting of pregnancy: Secondary | ICD-10-CM

## 2010-05-10 DIAGNOSIS — O99891 Other specified diseases and conditions complicating pregnancy: Secondary | ICD-10-CM | POA: Insufficient documentation

## 2010-05-10 DIAGNOSIS — O9989 Other specified diseases and conditions complicating pregnancy, childbirth and the puerperium: Secondary | ICD-10-CM

## 2010-05-10 DIAGNOSIS — K5289 Other specified noninfective gastroenteritis and colitis: Secondary | ICD-10-CM | POA: Insufficient documentation

## 2010-05-10 LAB — URINALYSIS, ROUTINE W REFLEX MICROSCOPIC
Glucose, UA: NEGATIVE mg/dL
Hgb urine dipstick: NEGATIVE
Specific Gravity, Urine: 1.02 (ref 1.005–1.030)
Urobilinogen, UA: 0.2 mg/dL (ref 0.0–1.0)

## 2010-05-15 ENCOUNTER — Inpatient Hospital Stay (HOSPITAL_COMMUNITY)
Admission: AD | Admit: 2010-05-15 | Discharge: 2010-05-15 | Disposition: A | Discharge status: Home / Self Care | Payer: Medicaid Other | Source: Ambulatory Visit | Attending: Obstetrics & Gynecology | Admitting: Obstetrics & Gynecology

## 2010-05-15 DIAGNOSIS — O479 False labor, unspecified: Secondary | ICD-10-CM | POA: Insufficient documentation

## 2010-05-16 ENCOUNTER — Other Ambulatory Visit: Payer: Self-pay | Admitting: Obstetrics and Gynecology

## 2010-05-16 DIAGNOSIS — O99810 Abnormal glucose complicating pregnancy: Secondary | ICD-10-CM

## 2010-05-16 DIAGNOSIS — Z331 Pregnant state, incidental: Secondary | ICD-10-CM

## 2010-05-16 LAB — POCT URINALYSIS DIP (DEVICE)
Bilirubin Urine: NEGATIVE
Glucose, UA: NEGATIVE mg/dL
Nitrite: NEGATIVE
Urobilinogen, UA: 0.2 mg/dL (ref 0.0–1.0)

## 2010-05-16 LAB — GLUCOSE, CAPILLARY: Glucose-Capillary: 93 mg/dL (ref 70–99)

## 2010-05-17 ENCOUNTER — Inpatient Hospital Stay (HOSPITAL_COMMUNITY)
Admission: AD | Admit: 2010-05-17 | Discharge: 2010-05-19 | DRG: 775 | Disposition: A | Discharge status: Home / Self Care | Payer: Medicaid Other | Source: Ambulatory Visit | Attending: Family Medicine | Admitting: Family Medicine

## 2010-05-17 DIAGNOSIS — O99814 Abnormal glucose complicating childbirth: Secondary | ICD-10-CM

## 2010-05-17 LAB — CBC
MCV: 93 fL (ref 78.0–100.0)
Platelets: 353 10*3/uL (ref 150–400)
RBC: 4.4 MIL/uL (ref 3.87–5.11)
RDW: 13.6 % (ref 11.5–15.5)
WBC: 13.4 10*3/uL — ABNORMAL HIGH (ref 4.0–10.5)

## 2010-05-17 LAB — RAPID URINE DRUG SCREEN, HOSP PERFORMED
Barbiturates: NOT DETECTED
Benzodiazepines: NOT DETECTED
Cocaine: NOT DETECTED
Opiates: NOT DETECTED

## 2010-05-17 LAB — RPR: RPR Ser Ql: NONREACTIVE

## 2010-07-18 ENCOUNTER — Ambulatory Visit (INDEPENDENT_AMBULATORY_CARE_PROVIDER_SITE_OTHER): Payer: Medicaid Other | Admitting: Obstetrics and Gynecology

## 2010-09-19 ENCOUNTER — Emergency Department (HOSPITAL_COMMUNITY)
Admission: EM | Admit: 2010-09-19 | Discharge: 2010-09-19 | Disposition: A | Discharge status: Home / Self Care | Payer: Medicaid Other | Attending: Emergency Medicine | Admitting: Emergency Medicine

## 2010-09-19 DIAGNOSIS — J45909 Unspecified asthma, uncomplicated: Secondary | ICD-10-CM | POA: Insufficient documentation

## 2010-09-19 DIAGNOSIS — E119 Type 2 diabetes mellitus without complications: Secondary | ICD-10-CM | POA: Insufficient documentation

## 2010-09-19 DIAGNOSIS — R51 Headache: Secondary | ICD-10-CM | POA: Insufficient documentation

## 2010-09-19 LAB — GLUCOSE, CAPILLARY

## 2010-10-14 LAB — URINE MICROSCOPIC-ADD ON

## 2010-10-14 LAB — I-STAT 8, (EC8 V) (CONVERTED LAB)
Acid-Base Excess: 1
BUN: 8
Chloride: 105
Potassium: 3.2 — ABNORMAL LOW
pCO2, Ven: 51.7 — ABNORMAL HIGH
pH, Ven: 7.336 — ABNORMAL HIGH

## 2010-10-14 LAB — URINALYSIS, ROUTINE W REFLEX MICROSCOPIC
Glucose, UA: NEGATIVE
Ketones, ur: 15 — AB
Leukocytes, UA: NEGATIVE
Nitrite: NEGATIVE
Specific Gravity, Urine: 1.024
pH: 7

## 2010-10-14 LAB — POCT I-STAT CREATININE: Creatinine, Ser: 0.7

## 2010-10-19 ENCOUNTER — Emergency Department (HOSPITAL_COMMUNITY)
Admission: EM | Admit: 2010-10-19 | Discharge: 2010-10-20 | Disposition: A | Discharge status: Home / Self Care | Payer: Medicaid Other | Attending: Emergency Medicine | Admitting: Emergency Medicine

## 2010-10-19 DIAGNOSIS — L293 Anogenital pruritus, unspecified: Secondary | ICD-10-CM | POA: Insufficient documentation

## 2010-10-20 ENCOUNTER — Encounter (HOSPITAL_COMMUNITY): Payer: Self-pay | Admitting: *Deleted

## 2010-10-20 ENCOUNTER — Inpatient Hospital Stay (EMERGENCY_DEPARTMENT_HOSPITAL)
Admission: AD | Admit: 2010-10-20 | Discharge: 2010-10-20 | Disposition: A | Discharge status: Home / Self Care | Payer: Medicaid Other | Source: Ambulatory Visit | Attending: Obstetrics and Gynecology | Admitting: Obstetrics and Gynecology

## 2010-10-20 DIAGNOSIS — L240 Irritant contact dermatitis due to detergents: Secondary | ICD-10-CM

## 2010-10-20 DIAGNOSIS — L258 Unspecified contact dermatitis due to other agents: Secondary | ICD-10-CM

## 2010-10-20 DIAGNOSIS — L259 Unspecified contact dermatitis, unspecified cause: Secondary | ICD-10-CM | POA: Insufficient documentation

## 2010-10-20 NOTE — Progress Notes (Signed)
D. Poe, CNM at bedside.  Assessment done and poc discussed with pt.  

## 2010-10-20 NOTE — Progress Notes (Signed)
Pt presents to [facility] with c/o vaginal itching on external. Worse when scrathcing.

## 2010-10-20 NOTE — Progress Notes (Signed)
PT SAYS SHE DELIVERED BY VAG ON 05-17-2010-  NO COMPLICATIONS.   BOTTLE FEEDING.  SAYS HAS VAG IRRITATION- STARTED  WHILE PREG- HAS HAD TRICH, YEAST INFECTION , BAV,  -  SAYS THIS   HAS CONTINUED- ITCHES- NOT IN VAGINA BUT ON SIDE.

## 2010-10-20 NOTE — Progress Notes (Signed)
SSE done per CNM. Wet prep collected.  VE done.

## 2010-10-20 NOTE — ED Provider Notes (Signed)
Jill Clarke y.o.[id number] withgenital irritation   SUBJECTIVE  HPI: 1 d hx of irritation and pruritis in rt intertriginous area and feels like she has a vaginal infection. Recently changed soaps.  On OCPs.   Past Medical History  Diagnosis Date  . Asthma   . Gestational diabetes    Ob Hx: Gyn Hx: Past Surgical History  Procedure Date  . Tonsillectomy    History   Social History  . Marital Status: Divorced    Spouse Name: N/A    Number of Children: N/A  . Years of Education: N/A   Occupational History  . Not on file.   Social History Main Topics  . Smoking status: Never Smoker   . Smokeless tobacco: Not on file  . Alcohol Use: No  . Drug Use: No  . Sexually Active:    Other Topics Concern  . Not on file   Social History Narrative  . No narrative on file   No current facility-administered medications on file prior to encounter.   No current outpatient prescriptions on file prior to encounter.   No Known Allergies  ROS: No dysuria, other skin outbreaks or lesions. No vaginal itch or irritation; normal vaginal discharge  OBJECTIVE  BP 106/68  Pulse 91  Temp(Src) 99.4 F (37.4 C) (Oral)  Resp 18  LMP 09/07/2010   Physical Exam:  General: WN/WD in NAD Abd: soft, NT Skin: rt inguinal crease is discolored pinkish with slightly raised rash that does not extend. No inguinal  lymphadenopathy. No caruncle or lumps. Pelvic: Vulva normal. BUS neg. Vagina clean, scanty white discharge. Cx appears friable and has ectropion. Uterus NSSP. Adnexae without tenderness or masses. Pelvic:   ASSESSMENT  Contact dermatitis right inguinal crease   PLAN Advised cool soaks, stop using new soap. May try 1 % hydrocortisone cream to area if needed.

## 2010-10-20 NOTE — Progress Notes (Signed)
SAYS    ONLY HURTS WHEN SHE SCRATCHES IT

## 2010-11-19 ENCOUNTER — Inpatient Hospital Stay (HOSPITAL_COMMUNITY): Payer: Medicaid Other

## 2010-11-19 ENCOUNTER — Inpatient Hospital Stay (HOSPITAL_COMMUNITY)
Admission: AD | Admit: 2010-11-19 | Discharge: 2010-11-19 | Disposition: A | Discharge status: Home / Self Care | Payer: Medicaid Other | Source: Ambulatory Visit | Attending: Obstetrics & Gynecology | Admitting: Obstetrics & Gynecology

## 2010-11-19 ENCOUNTER — Encounter (HOSPITAL_COMMUNITY): Payer: Self-pay

## 2010-11-19 DIAGNOSIS — O210 Mild hyperemesis gravidarum: Secondary | ICD-10-CM | POA: Insufficient documentation

## 2010-11-19 DIAGNOSIS — O219 Vomiting of pregnancy, unspecified: Secondary | ICD-10-CM

## 2010-11-19 LAB — CBC
HCT: 35.5 % — ABNORMAL LOW (ref 36.0–46.0)
MCV: 87.9 fL (ref 78.0–100.0)
RDW: 12.5 % (ref 11.5–15.5)
WBC: 7.1 10*3/uL (ref 4.0–10.5)

## 2010-11-19 LAB — DIFFERENTIAL
Basophils Absolute: 0 10*3/uL (ref 0.0–0.1)
Eosinophils Relative: 2 % (ref 0–5)
Lymphocytes Relative: 31 % (ref 12–46)
Monocytes Absolute: 0.6 10*3/uL (ref 0.1–1.0)

## 2010-11-19 LAB — URINALYSIS, ROUTINE W REFLEX MICROSCOPIC
Glucose, UA: 250 mg/dL — AB
Leukocytes, UA: NEGATIVE
Protein, ur: NEGATIVE mg/dL
Specific Gravity, Urine: 1.02 (ref 1.005–1.030)
pH: 6 (ref 5.0–8.0)

## 2010-11-19 LAB — POCT PREGNANCY, URINE: Preg Test, Ur: POSITIVE

## 2010-11-19 LAB — WET PREP, GENITAL: Yeast Wet Prep HPF POC: NONE SEEN

## 2010-11-19 IMAGING — US US OB COMP LESS 14 WK
1 series · 14 of 20 positions shown · non-contrast
Comparison: None for this pregnancy.

CLINICAL DATA: Early pregnancy.  Pain.

OBSTETRIC <14 WK ULTRASOUND
TECHNIQUE: Transabdominal ultrasound was performed for evaluation
of the gestation as well as the maternal uterus and adnexal
regions.

[Series 1: us ob comp less 14 wks · 14 of 20 slices shown]
[im 1/20]
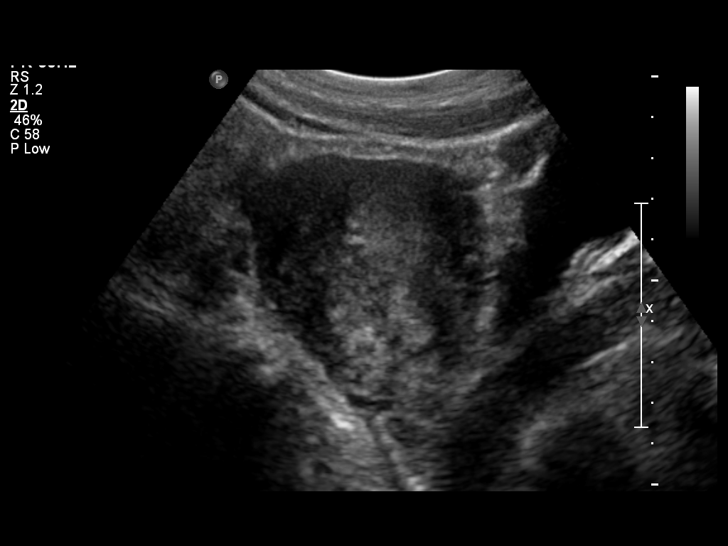
[im 3/20]
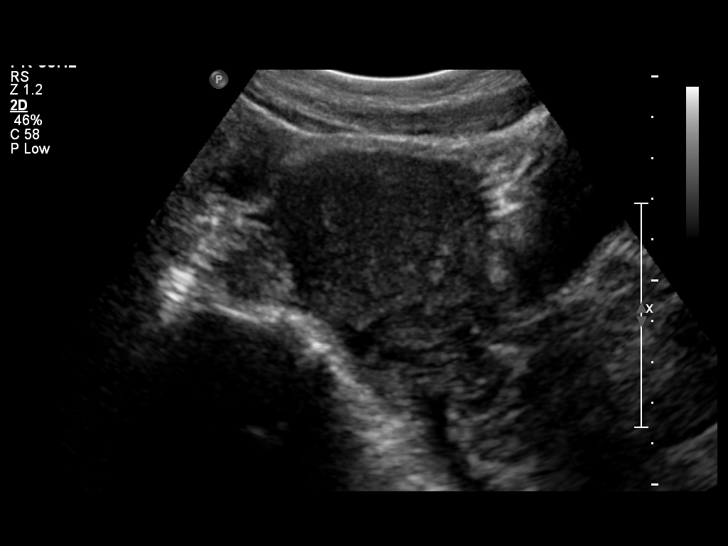
[im 4/20]
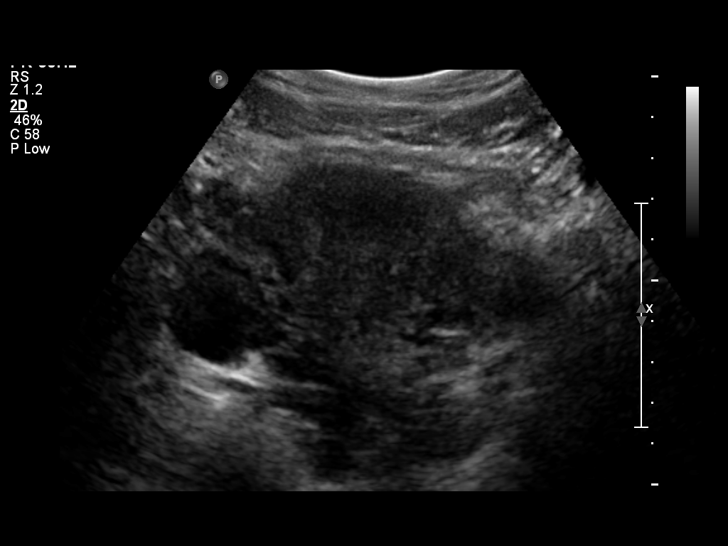
[im 6/20]
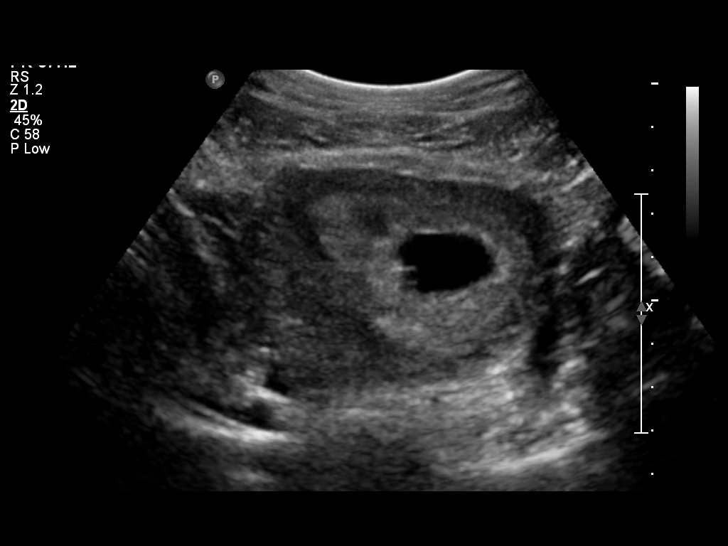
[im 7/20]
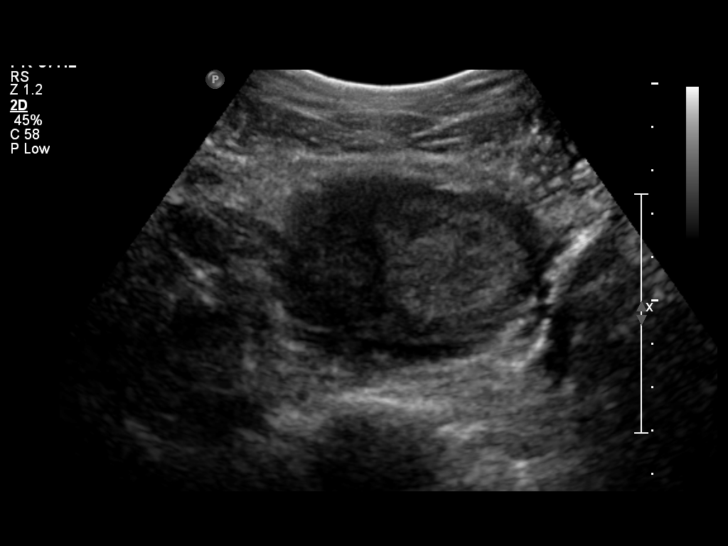
[im 8/20]
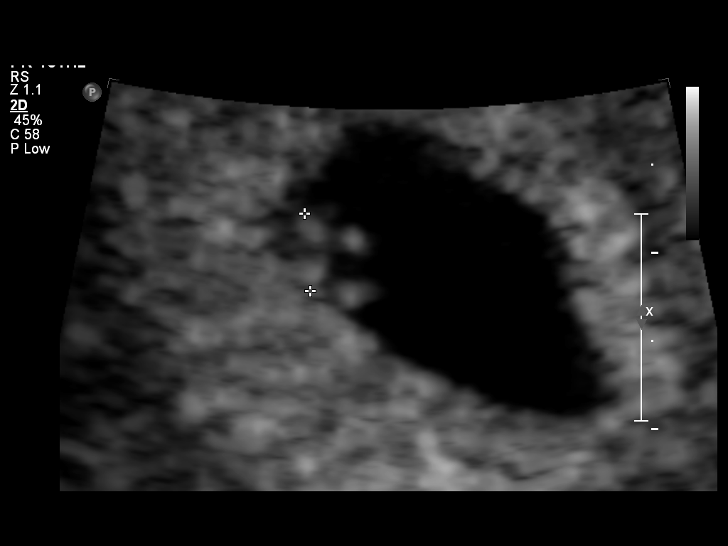
[im 10/20]
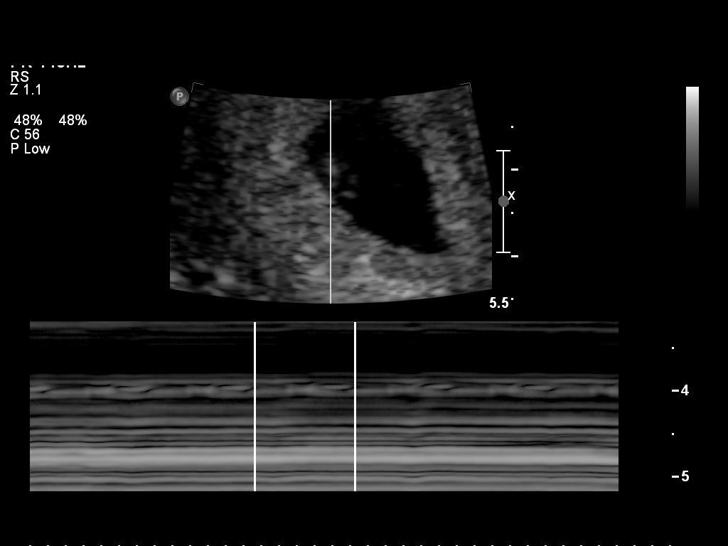
[im 11/20]
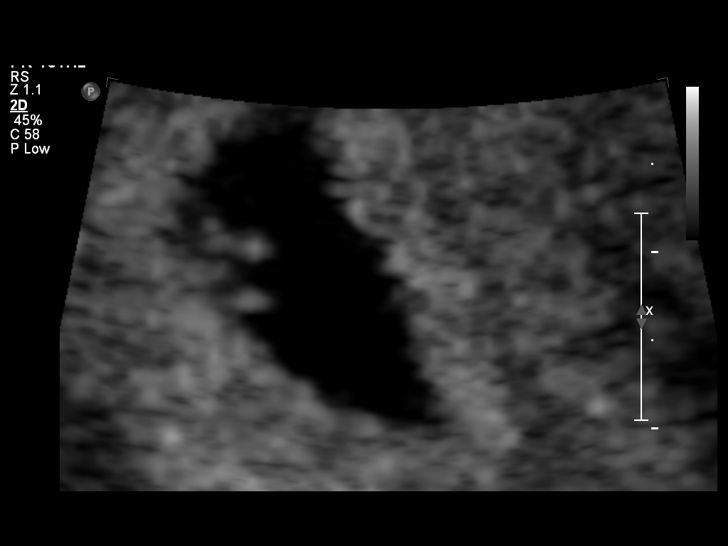
[im 13/20]
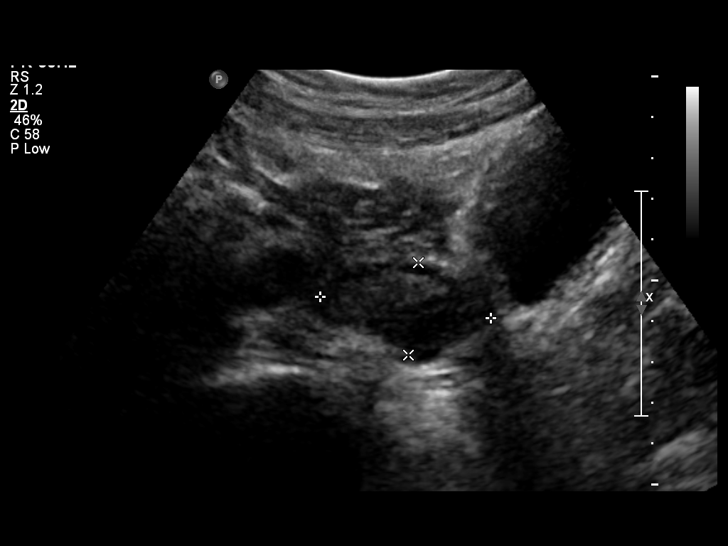
[im 14/20]
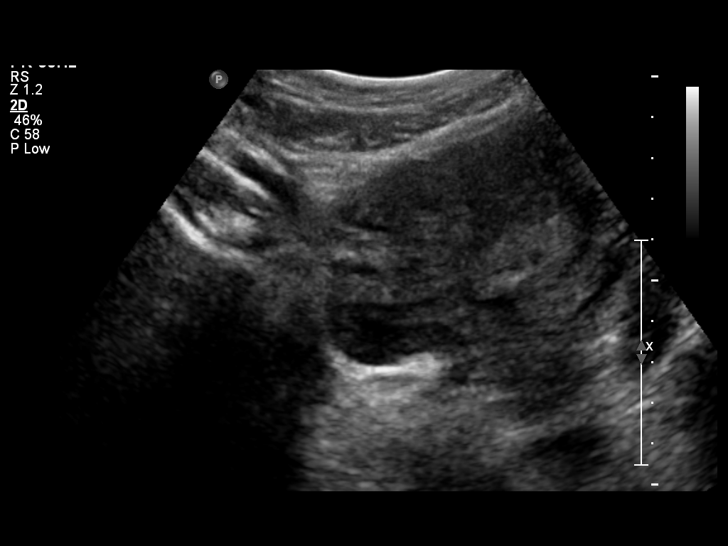
[im 16/20]
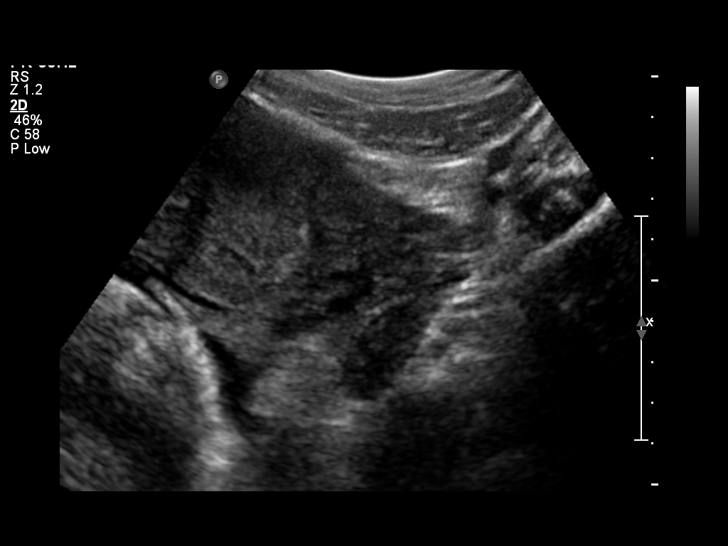
[im 17/20]
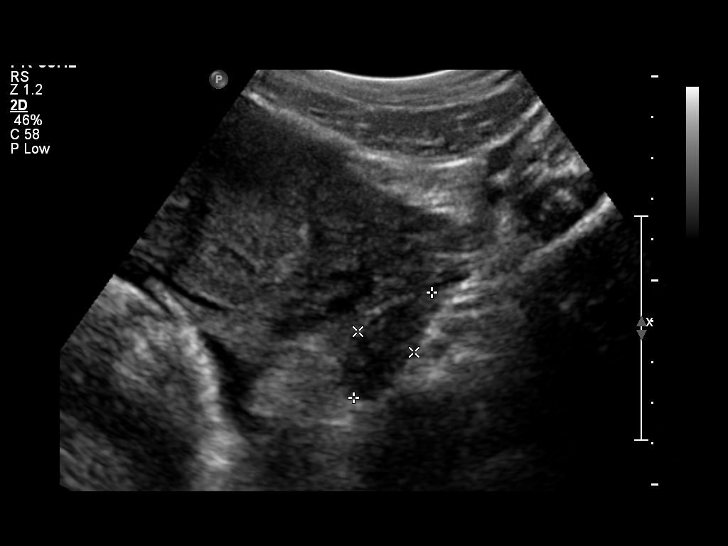
[im 18/20]
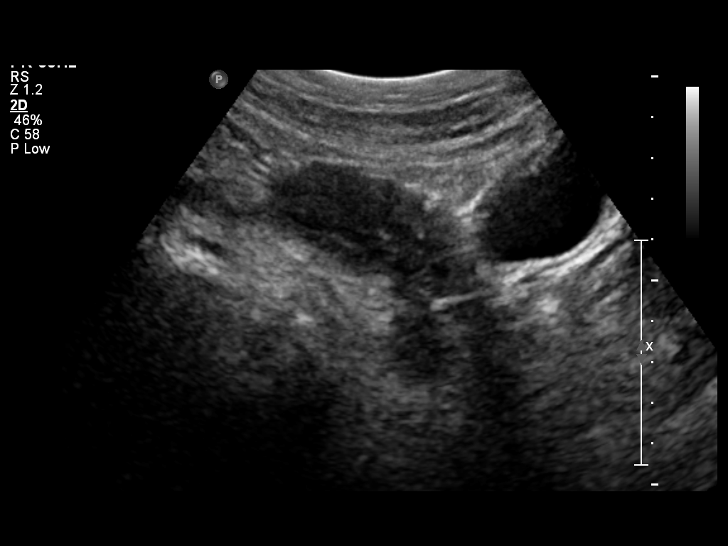
[im 20/20]
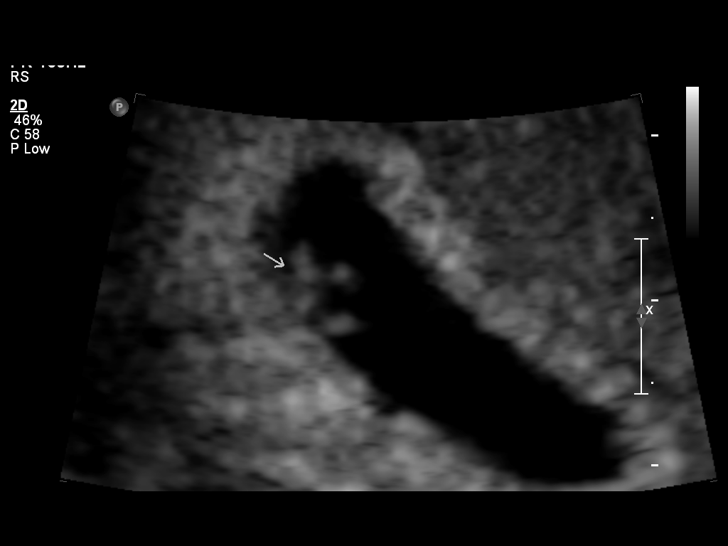

[14 of 20 positions shown; findings below may reference images not displayed]

Intrauterine gestational sac: Visualized/normal in shape.
Yolk sac: Present
Embryo: Present
Cardiac Activity: Present
Heart Rate: 106 bpm

CRL:  4.0 mm  6w  1d          US EDC: [DATE]

Maternal uterus/Adnexae:
The uterus and ovaries are otherwise normal.  There is no free
fluid.
IMPRESSION: Single intrauterine pregnancy with estimated gestational age of 6
weeks 1 day.

The fetal heart rate is 106 beats per minute, within normal limits
for age.

## 2010-11-19 MED ORDER — PROMETHAZINE HCL 25 MG PO TABS: 25.0000 mg | ORAL_TABLET | Freq: Four times a day (QID) | ORAL | Status: DC | PRN

## 2010-11-19 NOTE — Progress Notes (Signed)
Pt reports having N/V since on Tuesday after eating BBQ and slaw. Reports her stomach just does not fel right. Able to keep some fluids down. Appetite decreased

## 2010-11-19 NOTE — ED Provider Notes (Signed)
History     CSN: [id number] Arrival date & time: 11/19/2010  9:05 AM   None     Chief Complaint  Patient presents with  . Emesis  . Nausea   HPI Jill Clarke is a 25 y.o. female who presents to [facility] for nausea and feeling tired. She is taking OC's for birth control. She is having pregnancy symptoms. The history was provided by the patient.  Past Medical History  Diagnosis Date  . Asthma   . Gestational diabetes     Past Surgical History  Procedure Date  . Tonsillectomy     No family history on file.  History  Substance Use Topics  . Smoking status: Never Smoker   . Smokeless tobacco: Not on file  . Alcohol Use: No    OB History    Grav Para Term Preterm Abortions TAB SAB Ect Mult Living   1         1      Review of Systems  Constitutional: Positive for fatigue. Negative for fever, chills and appetite change.  HENT: Negative.   Eyes: Negative.   Respiratory: Negative.   Cardiovascular: Negative.   Gastrointestinal: Positive for nausea and vomiting.  Genitourinary: Positive for frequency and vaginal discharge. Negative for vaginal bleeding.  Musculoskeletal: Positive for back pain.  Neurological: Negative for light-headedness and headaches.  Psychiatric/Behavioral: Negative for agitation. The patient is not nervous/anxious.     Allergies  Review of patient's allergies indicates no known allergies.  Home Medications  No current outpatient prescriptions on file.  BP 105/63  Pulse 96  Temp(Src) 98.7 F (37.1 C) (Oral)  Resp 18  Ht 5' (1.524 m)  Wt 164 lb 3.2 oz (74.481 kg)  BMI 32.07 kg/m2  LMP 10/07/2010  Physical Exam  Nursing note and vitals reviewed. Constitutional: She is oriented to person, place, and time. She appears well-developed and well-nourished.  HENT:  Head: Normocephalic.  Eyes: EOM are normal.  Neck: Neck supple.  Cardiovascular: Normal rate.   Pulmonary/Chest: Effort normal.  Abdominal: Soft. There is no tenderness.    Genitourinary:       External genitalia without lesions, Cervix long, closed. No adnexal mass or tenderness palpated. Uterus slightly enlarged.  Musculoskeletal: Normal range of motion.  Neurological: She is alert and oriented to person, place, and time. No cranial nerve deficit.  Skin: Skin is warm and dry.   Assessment: Six week one day IUP   Nausea in early pregnancy  Plan:  Start prenatal care   Phenergan 25 mg po q 6 hours prn nausea   GC and chlamydia cultures pending.   Results for orders placed during the hospital encounter of 11/19/10 (from the past 24 hour(s))  URINALYSIS, ROUTINE W REFLEX MICROSCOPIC     Status: Abnormal   Collection Time   11/19/10  9:10 AM      Component Value Range   Color, Urine YELLOW  YELLOW    Appearance CLEAR  CLEAR    Specific Gravity, Urine 1.020  1.005 - 1.030    pH 6.0  5.0 - 8.0    Glucose, UA 250 (*) NEGATIVE (mg/dL)   Hgb urine dipstick NEGATIVE  NEGATIVE    Bilirubin Urine NEGATIVE  NEGATIVE    Ketones, ur NEGATIVE  NEGATIVE (mg/dL)   Protein, ur NEGATIVE  NEGATIVE (mg/dL)   Urobilinogen, UA 0.2  0.0 - 1.0 (mg/dL)   Nitrite NEGATIVE  NEGATIVE    Leukocytes, UA NEGATIVE  NEGATIVE   POCT PREGNANCY,  URINE     Status: Normal   Collection Time   11/19/10  9:41 AM      Component Value Range   Preg Test, Ur POSITIVE    CBC     Status: Abnormal   Collection Time   11/19/10  9:59 AM      Component Value Range   WBC 7.1  4.0 - 10.5 (K/uL)   RBC 4.04  3.87 - 5.11 (MIL/uL)   Hemoglobin 11.9 (*) 12.0 - 15.0 (g/dL)   HCT 16.1 (*) 09.6 - 46.0 (%)   MCV 87.9  78.0 - 100.0 (fL)   MCH 29.5  26.0 - 34.0 (pg)   MCHC 33.5  30.0 - 36.0 (g/dL)   RDW 04.5  40.9 - 81.1 (%)   Platelets 435 (*) 150 - 400 (K/uL)  DIFFERENTIAL     Status: Normal   Collection Time   11/19/10  9:59 AM      Component Value Range   Neutrophils Relative 59  43 - 77 (%)   Neutro Abs 4.2  1.7 - 7.7 (K/uL)   Lymphocytes Relative 31  12 - 46 (%)   Lymphs Abs 2.2  0.7 -  4.0 (K/uL)   Monocytes Relative 8  3 - 12 (%)   Monocytes Absolute 0.6  0.1 - 1.0 (K/uL)   Eosinophils Relative 2  0 - 5 (%)   Eosinophils Absolute 0.1  0.0 - 0.7 (K/uL)   Basophils Relative 0  0 - 1 (%)   Basophils Absolute 0.0  0.0 - 0.1 (K/uL)  HCG, QUANTITATIVE, PREGNANCY     Status: Abnormal   Collection Time   11/19/10  9:59 AM      Component Value Range   hCG, Beta Chain, Quant, [place] [address] (*) <5 (mIU/mL)  ABO/RH     Status: Normal   Collection Time   11/19/10  9:59 AM      Component Value Range   ABO/RH(D) O POS    WET PREP, GENITAL     Status: Abnormal   Collection Time   11/19/10 11:00 AM      Component Value Range   Yeast, Wet Prep NONE SEEN  NONE SEEN    Trich, Wet Prep NONE SEEN  NONE SEEN    Clue Cells, Wet Prep NONE SEEN  NONE SEEN    WBC, Wet Prep HPF POC FEW (*) NONE SEEN     ED Course  Procedures US Ob Comp Less 14 Wks  11/19/2010  *RADIOLOGY REPORT*  Clinical Data: Early pregnancy.  Pain.  OBSTETRIC <14 WK ULTRASOUND  Technique:  Transabdominal ultrasound was performed for evaluation of the gestation as well as the maternal uterus and adnexal regions.  Comparison:  None for this pregnancy.  Intrauterine gestational sac: Visualized/normal in shape. Yolk sac: Present Embryo: Present Cardiac Activity: Present Heart Rate: 106 bpm  CRL:  4.0 mm  6w  1d          [place] EDC: 07/14/2011  Maternal uterus/Adnexae: The uterus and ovaries are otherwise normal.  There is no free fluid.  IMPRESSION: Single intrauterine pregnancy with estimated gestational age of [redacted] weeks 1 day.  The fetal heart rate is 106 beats per minute, within normal limits for age.  Original Report Authenticated By: Jamesetta Orleans. MATTERN, M.D.      MDM          Kerrie Buffalo, NP 11/19/10 [phone]

## 2010-11-21 LAB — GC/CHLAMYDIA PROBE AMP, GENITAL: Chlamydia, DNA Probe: NEGATIVE

## 2010-12-28 ENCOUNTER — Encounter (HOSPITAL_COMMUNITY): Payer: Self-pay | Admitting: *Deleted

## 2010-12-28 ENCOUNTER — Inpatient Hospital Stay (HOSPITAL_COMMUNITY)
Admission: AD | Admit: 2010-12-28 | Discharge: 2010-12-28 | Disposition: A | Discharge status: Home / Self Care | Payer: Medicaid Other | Source: Ambulatory Visit | Attending: Obstetrics and Gynecology | Admitting: Obstetrics and Gynecology

## 2010-12-28 DIAGNOSIS — O219 Vomiting of pregnancy, unspecified: Secondary | ICD-10-CM

## 2010-12-28 DIAGNOSIS — O210 Mild hyperemesis gravidarum: Secondary | ICD-10-CM | POA: Insufficient documentation

## 2010-12-28 HISTORY — DX: Vomiting of pregnancy, unspecified: O21.9

## 2010-12-28 LAB — URINE MICROSCOPIC-ADD ON

## 2010-12-28 LAB — URINALYSIS, ROUTINE W REFLEX MICROSCOPIC
Glucose, UA: NEGATIVE mg/dL
Protein, ur: NEGATIVE mg/dL
Specific Gravity, Urine: 1.025 (ref 1.005–1.030)
pH: 6 (ref 5.0–8.0)

## 2010-12-28 MED ORDER — ONDANSETRON 8 MG PO TBDP
8.0000 mg | ORAL_TABLET | Freq: Once | ORAL | Status: AC
  Administered 2010-12-28: 8 mg via ORAL
  Filled 2010-12-28: qty 1

## 2010-12-28 MED ORDER — DOCUSATE SODIUM 100 MG PO CAPS: 100.0000 mg | ORAL_CAPSULE | Freq: Every day | ORAL | Status: DC | PRN

## 2010-12-28 MED ORDER — ONDANSETRON 4 MG PO TBDP: 4.0000 mg | ORAL_TABLET | Freq: Three times a day (TID) | ORAL | Status: AC | PRN

## 2010-12-28 MED ORDER — PRENATAL PLUS 27-1 MG PO TABS: 1.0000 | ORAL_TABLET | Freq: Every day | ORAL | Status: DC

## 2010-12-28 NOTE — ED Provider Notes (Signed)
History     Chief Complaint  Patient presents with  . Emesis During Pregnancy   HPI 25 y.o. G2P0 at [redacted]w[redacted]d c/o nausea and vomiting, has cramps when vomiting. No bleeding. Was taking Zofran ODT and it was working well, but ran out about 2 weeks ago, having trouble keeping anything down since.    Past Medical History  Diagnosis Date  . Asthma   . Gestational diabetes     Past Surgical History  Procedure Date  . Tonsillectomy     No family history on file.  History  Substance Use Topics  . Smoking status: Never Smoker   . Smokeless tobacco: Not on file  . Alcohol Use: No    Allergies: No Known Allergies  Prescriptions prior to admission  Medication Sig Dispense Refill  . calcium carbonate (TUMS - DOSED IN MG ELEMENTAL CALCIUM) 500 MG chewable tablet Chew 1 tablet by mouth daily.        . prenatal vitamin w/FE, FA (PRENATAL 1 + 1) 27-1 MG TABS Take 1 tablet by mouth daily.          Review of Systems  Constitutional: Negative.   Respiratory: Negative.   Cardiovascular: Negative.   Gastrointestinal: Positive for nausea and vomiting. Negative for abdominal pain, diarrhea and constipation.  Genitourinary: Negative for dysuria, urgency, frequency, hematuria and flank pain.       Negative for vaginal bleeding, vaginal discharge, dyspareunia  Musculoskeletal: Negative.   Neurological: Negative.   Psychiatric/Behavioral: Negative.    Physical Exam   Blood pressure 109/67, pulse 96, temperature 98.6 F (37 C), temperature source Oral, resp. rate 20, height 5\' 2"  (1.575 m), weight 162 lb (73.483 kg), last menstrual period 10/05/2010, SpO2 98.00%.  Physical Exam  Nursing note and vitals reviewed. Constitutional: She is oriented to person, place, and time. She appears well-developed and well-nourished. No distress.  Cardiovascular: Normal rate.   Respiratory: Effort normal.  Musculoskeletal: Normal range of motion.  Neurological: She is alert and oriented to person, place,  and time.  Skin: Skin is warm and dry.  Psychiatric: She has a normal mood and affect.    MAU Course  Procedures  Results for orders placed during the hospital encounter of 12/28/10 (from the past 24 hour(s))  URINALYSIS, ROUTINE W REFLEX MICROSCOPIC     Status: Abnormal   Collection Time   12/28/10  7:05 PM      Component Value Range   Color, Urine YELLOW  YELLOW    APPearance HAZY (*) CLEAR    Specific Gravity, Urine 1.025  1.005 - 1.030    pH 6.0  5.0 - 8.0    Glucose, UA NEGATIVE  NEGATIVE (mg/dL)   Hgb urine dipstick TRACE (*) NEGATIVE    Bilirubin Urine NEGATIVE  NEGATIVE    Ketones, ur 15 (*) NEGATIVE (mg/dL)   Protein, ur NEGATIVE  NEGATIVE (mg/dL)   Urobilinogen, UA 0.2  0.0 - 1.0 (mg/dL)   Nitrite NEGATIVE  NEGATIVE    Leukocytes, UA SMALL (*) NEGATIVE   URINE MICROSCOPIC-ADD ON     Status: Abnormal   Collection Time   12/28/10  7:05 PM      Component Value Range   Squamous Epithelial / LPF MANY (*) RARE    WBC, UA 0-2  <3 (WBC/hpf)   Bacteria, UA FEW (*) RARE    Urine-Other MUCOUS PRESENT     Urine culture sent  Zofran ODT ordered, feeling better afterwards   Assessment and Plan  25 y.o. [id number]  at [redacted]w[redacted]d with nausea and vomiting F/U as scheduled Medication List  As of 12/30/2010  3:57 AM   START taking these medications         docusate sodium 100 MG capsule   Commonly known as: COLACE   Take 1 capsule (100 mg total) by mouth daily as needed for constipation.      ondansetron 4 MG disintegrating tablet   Commonly known as: ZOFRAN-ODT   Take 1 tablet (4 mg total) by mouth every 8 (eight) hours as needed for nausea.         CONTINUE taking these medications         calcium carbonate 500 MG chewable tablet   Commonly known as: TUMS - dosed in mg elemental calcium      prenatal vitamin w/FE, FA 27-1 MG Tabs   Take 1 tablet by mouth daily.          Where to get your medications    These are the prescriptions that you need to pick up. We sent them  to a specific pharmacy, so you will need to go there to get them.   [facility] - [place], [place] - [address]    [address] [place] [address]    Phone: [phone]        docusate sodium 100 MG capsule   ondansetron 4 MG disintegrating tablet   prenatal vitamin w/FE, FA 27-1 MG Tabs             Jill Clarke 12/28/2010, 7:08 PM

## 2010-12-28 NOTE — Progress Notes (Signed)
Constantly throwing up. Need some pills to stop throwing up. Has cramps when throws up.

## 2010-12-30 LAB — URINE CULTURE
Colony Count: >=100000
Culture  Setup Time: 201212060411

## 2011-01-09 ENCOUNTER — Other Ambulatory Visit: Payer: Self-pay | Admitting: Family Medicine

## 2011-01-09 ENCOUNTER — Other Ambulatory Visit (HOSPITAL_COMMUNITY): Payer: Self-pay | Admitting: Physician Assistant

## 2011-01-09 DIAGNOSIS — Z3682 Encounter for antenatal screening for nuchal translucency: Secondary | ICD-10-CM

## 2011-01-09 LAB — GLUCOSE, 1 HOUR: Glucose, 1 hour: 145

## 2011-01-09 LAB — CBC
HCT: 37 % (ref 36–46)
Platelets: 457 10*3/uL — AB (ref 150–399)

## 2011-01-09 LAB — GC/CHLAMYDIA PROBE AMP, GENITAL: Gonorrhea: NEGATIVE

## 2011-01-10 ENCOUNTER — Ambulatory Visit (HOSPITAL_COMMUNITY): Payer: Medicaid Other

## 2011-01-10 ENCOUNTER — Ambulatory Visit (HOSPITAL_COMMUNITY): Admission: RE | Admit: 2011-01-10 | Payer: Medicaid Other | Source: Ambulatory Visit

## 2011-01-11 ENCOUNTER — Encounter (HOSPITAL_COMMUNITY): Payer: Self-pay

## 2011-01-11 ENCOUNTER — Ambulatory Visit (HOSPITAL_COMMUNITY)
Admission: RE | Admit: 2011-01-11 | Discharge: 2011-01-11 | Disposition: A | Discharge status: Home / Self Care | Payer: Medicaid Other | Source: Ambulatory Visit | Attending: Physician Assistant | Admitting: Physician Assistant

## 2011-01-11 DIAGNOSIS — O09299 Supervision of pregnancy with other poor reproductive or obstetric history, unspecified trimester: Secondary | ICD-10-CM | POA: Insufficient documentation

## 2011-01-11 DIAGNOSIS — Z3682 Encounter for antenatal screening for nuchal translucency: Secondary | ICD-10-CM

## 2011-01-11 DIAGNOSIS — O3510X0 Maternal care for (suspected) chromosomal abnormality in fetus, unspecified, not applicable or unspecified: Secondary | ICD-10-CM | POA: Insufficient documentation

## 2011-01-11 DIAGNOSIS — Z3689 Encounter for other specified antenatal screening: Secondary | ICD-10-CM | POA: Insufficient documentation

## 2011-01-11 DIAGNOSIS — O351XX0 Maternal care for (suspected) chromosomal abnormality in fetus, not applicable or unspecified: Secondary | ICD-10-CM | POA: Insufficient documentation

## 2011-01-11 IMAGING — US US OB NUCHAL TRANSLUCENCY 1ST GEST
1 series · 14 of 28 positions shown · non-contrast
Comparison: none

[Series 1: us ob nuchal translucency 1st gest · 0.18mm/px · 14 of 31 slices shown]
[im 2/31]
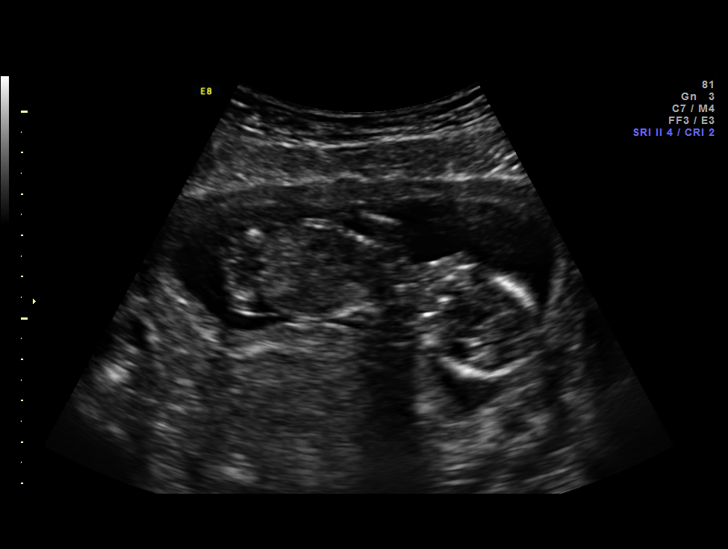
[im 4/31]
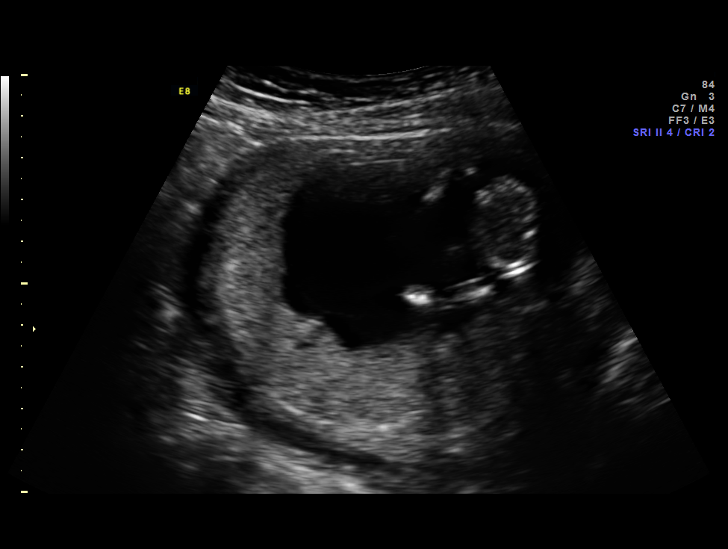
[im 6/31]
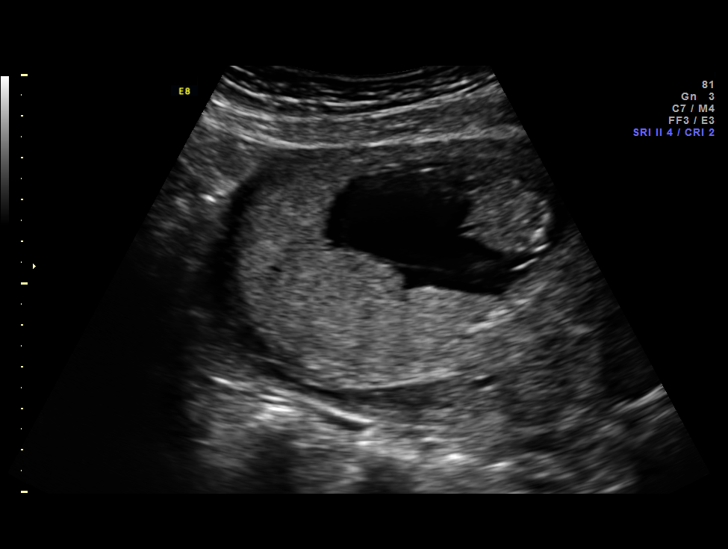
[im 8/31]
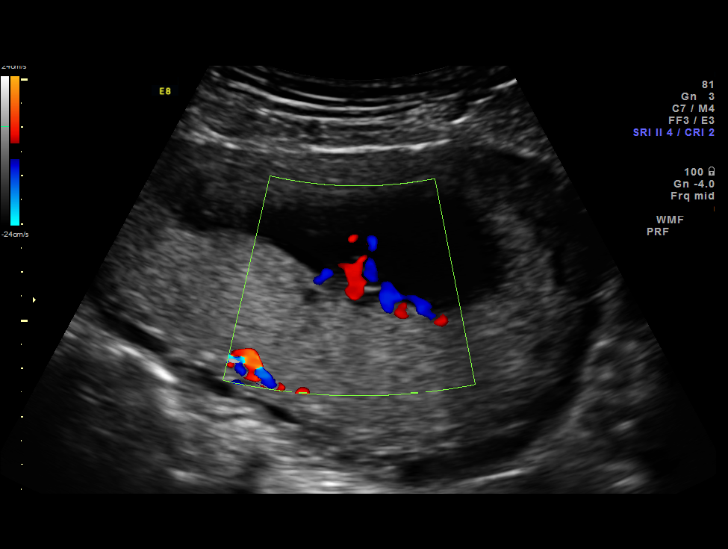
[im 11/31]
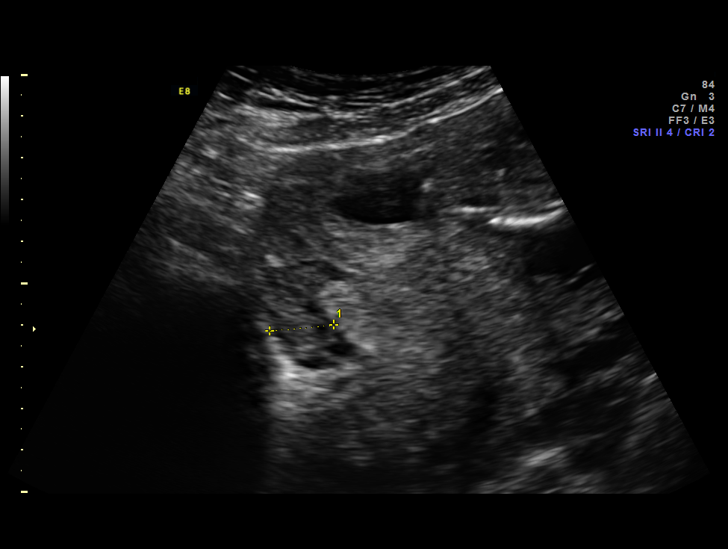
[im 13/31]
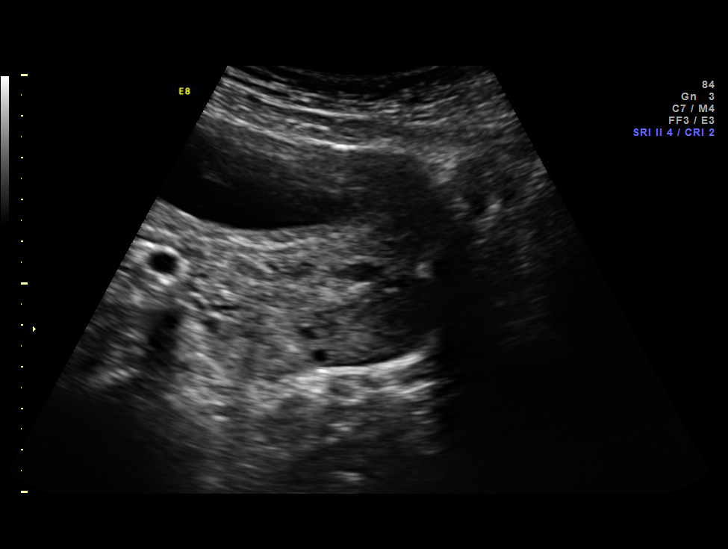
[im 15/31]
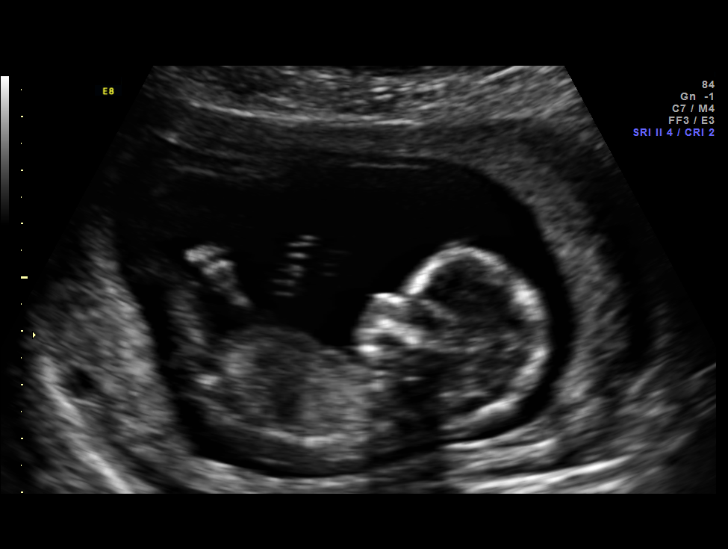
[im 17/31]
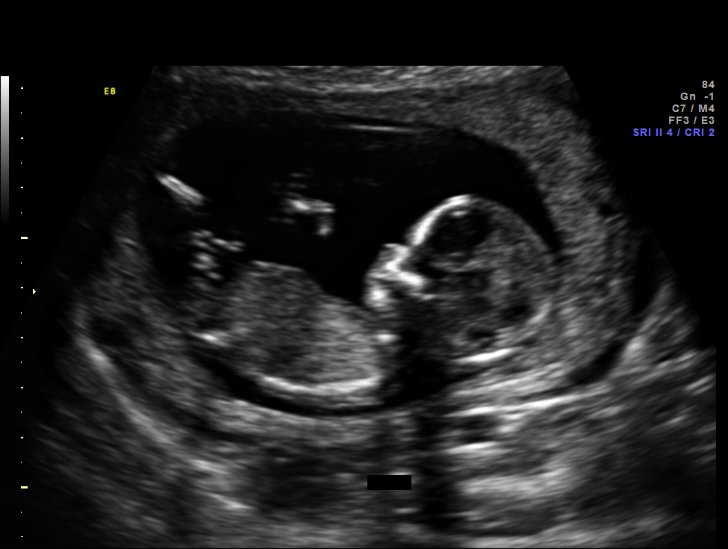
[im 19/31]
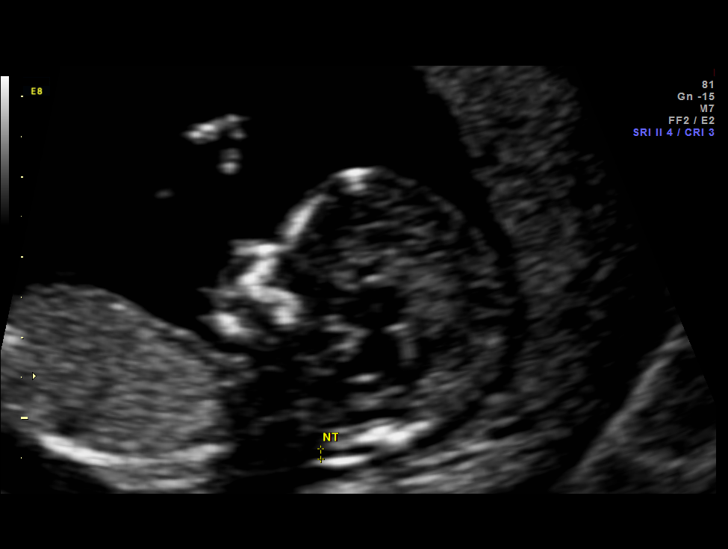
[im 22/31]
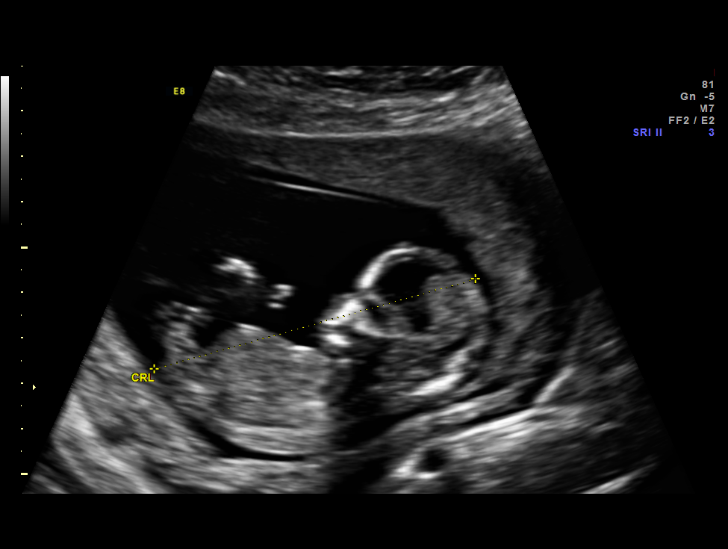
[im 24/31]
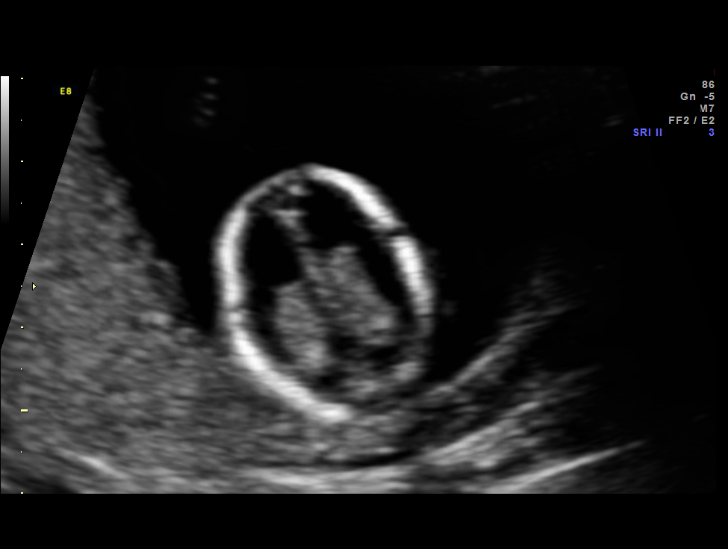
[im 26/31]
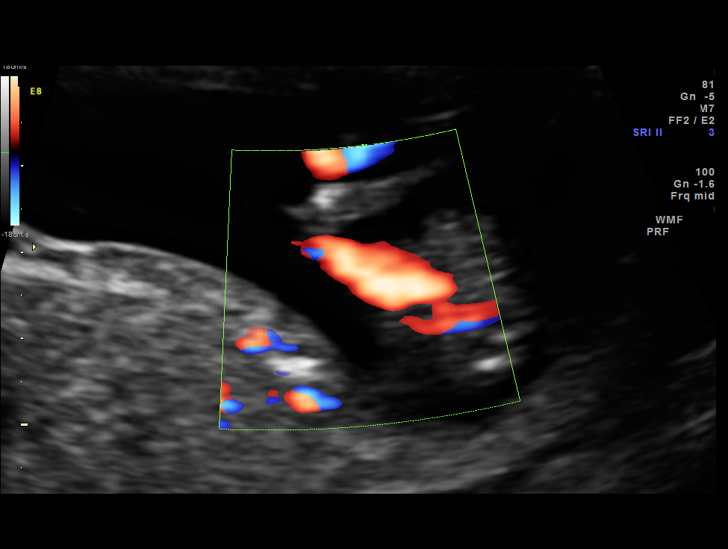
[im 28/31]
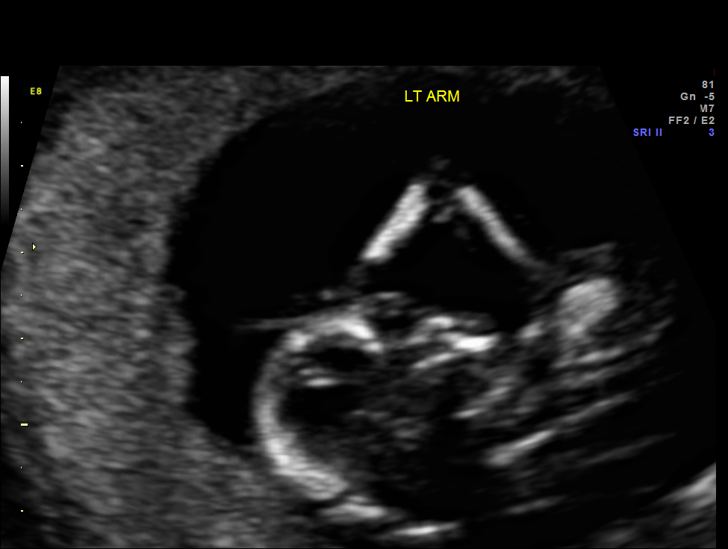
[im 31/31]
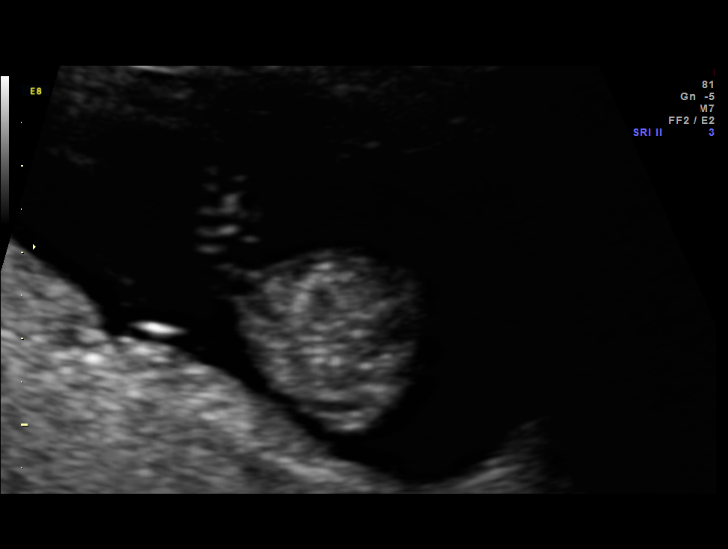

[14 of 28 positions shown; findings below may reference images not displayed]

Canned report from images found in remote index.

Refer to host system for actual result text.

## 2011-01-11 NOTE — Progress Notes (Signed)
Jill Clarke was seen for ultrasound appointment today.  Please see [facility] report for details.  

## 2011-01-22 ENCOUNTER — Encounter (HOSPITAL_COMMUNITY): Payer: Self-pay

## 2011-01-22 ENCOUNTER — Inpatient Hospital Stay (HOSPITAL_COMMUNITY)
Admission: AD | Admit: 2011-01-22 | Discharge: 2011-01-22 | Disposition: A | Discharge status: Home / Self Care | Payer: Medicaid Other | Source: Ambulatory Visit | Attending: Obstetrics & Gynecology | Admitting: Obstetrics & Gynecology

## 2011-01-22 DIAGNOSIS — H6090 Unspecified otitis externa, unspecified ear: Secondary | ICD-10-CM

## 2011-01-22 DIAGNOSIS — H60399 Other infective otitis externa, unspecified ear: Secondary | ICD-10-CM

## 2011-01-22 DIAGNOSIS — O99891 Other specified diseases and conditions complicating pregnancy: Secondary | ICD-10-CM | POA: Insufficient documentation

## 2011-01-22 MED ORDER — NEOMYCIN-POLYMYXIN-HC 3.5-10000-1 OT SOLN: 3.0000 [drp] | Freq: Four times a day (QID) | OTIC | Status: DC

## 2011-01-22 NOTE — ED Provider Notes (Signed)
History     No chief complaint on file.  HPI  Patient is here with c/o of right ear ringing and "clogged up" since yesterday. She states that she hear out of it and decribes it as stuffed up. She denies any any other discomfort.    Past Medical History  Diagnosis Date  . Asthma   . Gestational diabetes     GDM    Past Surgical History  Procedure Date  . Tonsillectomy     History reviewed. No pertinent family history.  History  Substance Use Topics  . Smoking status: Never Smoker   . Smokeless tobacco: Not on file  . Alcohol Use: No    Allergies: No Known Allergies  Prescriptions prior to admission  Medication Sig Dispense Refill  . calcium carbonate (TUMS - DOSED IN MG ELEMENTAL CALCIUM) 500 MG chewable tablet Chew 1 tablet by mouth 2 (two) times daily as needed.       . prenatal vitamin w/FE, FA (PRENATAL 1 + 1) 27-1 MG TABS Take 1 tablet by mouth daily.  30 each  11    Review of Systems  Constitutional: Negative.   HENT: Positive for hearing loss ( muffled sounding) and ear pain.   Respiratory: Negative.   Cardiovascular: Negative.   Neurological: Negative for headaches.   Physical Exam   Blood pressure 107/61, pulse 98, temperature 97.5 F (36.4 C), temperature source Oral, resp. rate 16, height 5' (1.524 m), weight 75.569 kg (166 lb 9.6 oz), last menstrual period 10/06/2010, SpO2 96.00%.  Physical Exam  Constitutional: She is oriented to person, place, and time. She appears well-developed and well-nourished. No distress.  HENT:  Head: Normocephalic.  Right Ear: There is swelling. Decreased hearing is noted.  Left Ear: Hearing, tympanic membrane, external ear and ear canal normal.  Neck: Normal range of motion. Neck supple.  Neurological: She is alert and oriented to person, place, and time. She has normal reflexes.  Skin: Skin is warm and dry.    MAU Course  Procedures  Assessment and Plan  Otitis Externa  Plan: DC to home RX Cortisporin Otic  4 gtt TID x 7 days F/U if no improvement  [facility] 01/22/2011, 5:53 AM

## 2011-01-22 NOTE — Progress Notes (Signed)
Patient is here with c/o of right ear ringing and "clogged up" since yesterday. She states that she hear out of it and decribes it as stuffed up. She denies any any other discomfort.

## 2011-01-23 DIAGNOSIS — J45909 Unspecified asthma, uncomplicated: Secondary | ICD-10-CM | POA: Insufficient documentation

## 2011-01-23 DIAGNOSIS — O09899 Supervision of other high risk pregnancies, unspecified trimester: Secondary | ICD-10-CM

## 2011-01-23 DIAGNOSIS — Z8632 Personal history of gestational diabetes: Secondary | ICD-10-CM

## 2011-01-23 DIAGNOSIS — O24919 Unspecified diabetes mellitus in pregnancy, unspecified trimester: Secondary | ICD-10-CM | POA: Insufficient documentation

## 2011-01-23 NOTE — Progress Notes (Signed)
Pt needs to see social worker and nutrition at visit. 

## 2011-01-24 NOTE — L&D Delivery Note (Cosign Needed)
Delivery Note At 5:32 PM a viable female was delivered via Vaginal, Spontaneous Delivery (Presentation: ; Occiput Anterior).  APGAR: pending; weight pending.   Placenta status: Intact, Spontaneous.  Cord: 3 vessels with the following complications: None.   Anesthesia: None  Episiotomy: None Lacerations: None Est. Blood Loss (mL): 200  Mom to postpartum.  Baby to nursery-stable.  Clancy Gourd 07/13/2011, 5:50 PM  Supervised by Sharen Counter, CNM

## 2011-01-27 DIAGNOSIS — O0990 Supervision of high risk pregnancy, unspecified, unspecified trimester: Secondary | ICD-10-CM

## 2011-01-27 HISTORY — DX: Supervision of high risk pregnancy, unspecified, unspecified trimester: O09.90

## 2011-01-30 ENCOUNTER — Ambulatory Visit (INDEPENDENT_AMBULATORY_CARE_PROVIDER_SITE_OTHER): Payer: Medicaid Other | Admitting: Family Medicine

## 2011-01-30 ENCOUNTER — Encounter: Payer: Medicaid Other | Attending: Obstetrics & Gynecology | Admitting: Dietician

## 2011-01-30 ENCOUNTER — Other Ambulatory Visit: Payer: Self-pay | Admitting: Obstetrics & Gynecology

## 2011-01-30 DIAGNOSIS — Z713 Dietary counseling and surveillance: Secondary | ICD-10-CM | POA: Insufficient documentation

## 2011-01-30 DIAGNOSIS — O0990 Supervision of high risk pregnancy, unspecified, unspecified trimester: Secondary | ICD-10-CM

## 2011-01-30 DIAGNOSIS — Z8632 Personal history of gestational diabetes: Secondary | ICD-10-CM

## 2011-01-30 DIAGNOSIS — O24919 Unspecified diabetes mellitus in pregnancy, unspecified trimester: Secondary | ICD-10-CM

## 2011-01-30 DIAGNOSIS — O99810 Abnormal glucose complicating pregnancy: Secondary | ICD-10-CM | POA: Insufficient documentation

## 2011-01-30 LAB — CBC
Hemoglobin: 11.6 g/dL — ABNORMAL LOW (ref 12.0–15.0)
MCH: 30.4 pg (ref 26.0–34.0)
MCV: 90.3 fL (ref 78.0–100.0)
Platelets: 425 10*3/uL — ABNORMAL HIGH (ref 150–400)
RBC: 3.81 MIL/uL — ABNORMAL LOW (ref 3.87–5.11)

## 2011-01-30 LAB — POCT URINALYSIS DIP (DEVICE)
Glucose, UA: NEGATIVE mg/dL
Hgb urine dipstick: NEGATIVE
Specific Gravity, Urine: 1.02 (ref 1.005–1.030)
Urobilinogen, UA: 0.2 mg/dL (ref 0.0–1.0)

## 2011-01-30 LAB — COMPREHENSIVE METABOLIC PANEL
BUN: 6 mg/dL (ref 6–23)
CO2: 24 mEq/L (ref 19–32)
Creat: 0.44 mg/dL — ABNORMAL LOW (ref 0.50–1.10)
Glucose, Bld: 73 mg/dL (ref 70–99)
Total Bilirubin: 0.3 mg/dL (ref 0.3–1.2)

## 2011-01-30 MED ORDER — GLUCOSE BLOOD VI STRP: ORAL_STRIP | Status: DC

## 2011-01-30 MED ORDER — ACCU-CHEK NANO SMARTVIEW W/DEVICE KIT: 1.0000 [IU] | PACK | Status: DC

## 2011-01-30 MED ORDER — ACCU-CHEK FASTCLIX LANCETS MISC: 1.0000 [IU] | Freq: Four times a day (QID) | Status: DC

## 2011-01-30 NOTE — Patient Instructions (Signed)
Pregnancy - Second Trimester The second trimester of pregnancy (3 to 6 months) is a period of rapid growth for you and your baby. At the end of the sixth month, your baby is about 9 inches long and weighs 1 1/2 pounds. You will begin to feel the baby move between 18 and 20 weeks of the pregnancy. This is called quickening. Weight gain is faster. A clear fluid (colostrum) may leak out of your breasts. You may feel small contractions of the womb (uterus). This is known as false labor or Braxton-Hicks contractions. This is like a practice for labor when the baby is ready to be born. Usually, the problems with morning sickness have usually passed by the end of your first trimester. Some women develop small dark blotches (called cholasma, mask of pregnancy) on their face that usually goes away after the baby is born. Exposure to the sun makes the blotches worse. Acne may also develop in some pregnant women and pregnant women who have acne, may find that it goes away. PRENATAL EXAMS  Blood work may continue to be done during prenatal exams. These tests are done to check on your health and the probable health of your baby. Blood work is used to follow your blood levels (hemoglobin). Anemia (low hemoglobin) is common during pregnancy. Iron and vitamins are given to help prevent this. You will also be checked for diabetes between 24 and 28 weeks of the pregnancy. Some of the previous blood tests may be repeated.   The size of the uterus is measured during each visit. This is to make sure that the baby is continuing to grow properly according to the dates of the pregnancy.   Your blood pressure is checked every prenatal visit. This is to make sure you are not getting toxemia.   Your urine is checked to make sure you do not have an infection, diabetes or protein in the urine.   Your weight is checked often to make sure gains are happening at the suggested rate. This is to ensure that both you and your baby are  growing normally.   Sometimes, an ultrasound is performed to confirm the proper growth and development of the baby. This is a test which bounces harmless sound waves off the baby so your caregiver can more accurately determine due dates.  Sometimes, a specialized test is done on the amniotic fluid surrounding the baby. This test is called an amniocentesis. The amniotic fluid is obtained by sticking a needle into the belly (abdomen). This is done to check the chromosomes in instances where there is a concern about possible genetic problems with the baby. It is also sometimes done near the end of pregnancy if an early delivery is required. In this case, it is done to help make sure the baby's lungs are mature enough for the baby to live outside of the womb. CHANGES OCCURING IN THE SECOND TRIMESTER OF PREGNANCY Your body goes through many changes during pregnancy. They vary from person to person. Talk to your caregiver about changes you notice that you are concerned about.  During the second trimester, you will likely have an increase in your appetite. It is normal to have cravings for certain foods. This varies from person to person and pregnancy to pregnancy.   Your lower abdomen will begin to bulge.   You may have to urinate more often because the uterus and baby are pressing on your bladder. It is also common to get more bladder infections during pregnancy (  pain with urination). You can help this by drinking lots of fluids and emptying your bladder before and after intercourse.   You may begin to get stretch marks on your hips, abdomen, and breasts. These are normal changes in the body during pregnancy. There are no exercises or medications to take that prevent this change.   You may begin to develop swollen and bulging veins (varicose veins) in your legs. Wearing support hose, elevating your feet for 15 minutes, 3 to 4 times a day and limiting salt in your diet helps lessen the problem.    Heartburn may develop as the uterus grows and pushes up against the stomach. Antacids recommended by your caregiver helps with this problem. Also, eating smaller meals 4 to 5 times a day helps.   Constipation can be treated with a stool softener or adding bulk to your diet. Drinking lots of fluids, vegetables, fruits, and whole grains are helpful.   Exercising is also helpful. If you have been very active up until your pregnancy, most of these activities can be continued during your pregnancy. If you have been less active, it is helpful to start an exercise program such as walking.   Hemorrhoids (varicose veins in the rectum) may develop at the end of the second trimester. Warm sitz baths and hemorrhoid cream recommended by your caregiver helps hemorrhoid problems.   Backaches may develop during this time of your pregnancy. Avoid heavy lifting, wear low heal shoes and practice good posture to help with backache problems.   Some pregnant women develop tingling and numbness of their hand and fingers because of swelling and tightening of ligaments in the wrist (carpel tunnel syndrome). This goes away after the baby is born.   As your breasts enlarge, you may have to get a bigger bra. Get a comfortable, cotton, support bra. Do not get a nursing bra until the last month of the pregnancy if you will be nursing the baby.   You may get a dark line from your belly button to the pubic area called the linea nigra.   You may develop rosy cheeks because of increase blood flow to the face.   You may develop spider looking lines of the face, neck, arms and chest. These go away after the baby is born.  HOME CARE INSTRUCTIONS   It is extremely important to avoid all smoking, herbs, alcohol, and unprescribed drugs during your pregnancy. These chemicals affect the formation and growth of the baby. Avoid these chemicals throughout the pregnancy to ensure the delivery of a healthy infant.   Most of your home  care instructions are the same as suggested for the first trimester of your pregnancy. Keep your caregiver's appointments. Follow your caregiver's instructions regarding medication use, exercise and diet.   During pregnancy, you are providing food for you and your baby. Continue to eat regular, well-balanced meals. Choose foods such as meat, fish, milk and other low fat dairy products, vegetables, fruits, and whole-grain breads and cereals. Your caregiver will tell you of the ideal weight gain.   A physical sexual relationship may be continued up until near the end of pregnancy if there are no other problems. Problems could include early (premature) leaking of amniotic fluid from the membranes, vaginal bleeding, abdominal pain, or other medical or pregnancy problems.   Exercise regularly if there are no restrictions. Check with your caregiver if you are unsure of the safety of some of your exercises. The greatest weight gain will occur in the   last 2 trimesters of pregnancy. Exercise will help you:   Control your weight.   Get you in shape for labor and delivery.   Lose weight after you have the baby.   Wear a good support or jogging bra for breast tenderness during pregnancy. This may help if worn during sleep. Pads or tissues may be used in the bra if you are leaking colostrum.   Do not use hot tubs, steam rooms or saunas throughout the pregnancy.   Wear your seat belt at all times when driving. This protects you and your baby if you are in an accident.   Avoid raw meat, uncooked cheese, cat litter boxes and soil used by cats. These carry germs that can cause birth defects in the baby.   The second trimester is also a good time to visit your dentist for your dental health if this has not been done yet. Getting your teeth cleaned is OK. Use a soft toothbrush. Brush gently during pregnancy.   It is easier to loose urine during pregnancy. Tightening up and strengthening the pelvic muscles will  help with this problem. Practice stopping your urination while you are going to the bathroom. These are the same muscles you need to strengthen. It is also the muscles you would use as if you were trying to stop from passing gas. You can practice tightening these muscles up 10 times a set and repeating this about 3 times per day. Once you know what muscles to tighten up, do not perform these exercises during urination. It is more likely to contribute to an infection by backing up the urine.   Ask for help if you have financial, counseling or nutritional needs during pregnancy. Your caregiver will be able to offer counseling for these needs as well as refer you for other special needs.   Your skin may become oily. If so, wash your face with mild soap, use non-greasy moisturizer and oil or cream based makeup.  MEDICATIONS AND DRUG USE IN PREGNANCY  Take prenatal vitamins as directed. The vitamin should contain 1 milligram of folic acid. Keep all vitamins out of reach of children. Only a couple vitamins or tablets containing iron may be fatal to a baby or young child when ingested.   Avoid use of all medications, including herbs, over-the-counter medications, not prescribed or suggested by your caregiver. Only take over-the-counter or prescription medicines for pain, discomfort, or fever as directed by your caregiver. Do not use aspirin.   Let your caregiver also know about herbs you may be using.   Alcohol is related to a number of birth defects. This includes fetal alcohol syndrome. All alcohol, in any form, should be avoided completely. Smoking will cause low birth rate and premature babies.   Street or illegal drugs are very harmful to the baby. They are absolutely forbidden. A baby born to an addicted mother will be addicted at birth. The baby will go through the same withdrawal an adult does.  SEEK MEDICAL CARE IF:  You have any concerns or worries during your pregnancy. It is better to call with  your questions if you feel they cannot wait, rather than worry about them. SEEK IMMEDIATE MEDICAL CARE IF:   An unexplained oral temperature above 102 F (38.9 C) develops, or as your caregiver suggests.   You have leaking of fluid from the vagina (birth canal). If leaking membranes are suspected, take your temperature and tell your caregiver of this when you call.   There   is vaginal spotting, bleeding, or passing clots. Tell your caregiver of the amount and how many pads are used. Light spotting in pregnancy is common, especially following intercourse.   You develop a bad smelling vaginal discharge with a change in the color from clear to white.   You continue to feel sick to your stomach (nauseated) and have no relief from remedies suggested. You vomit blood or coffee ground-like materials.   You lose more than 2 pounds of weight or gain more than 2 pounds of weight over 1 week, or as suggested by your caregiver.   You notice swelling of your face, hands, feet, or legs.   You get exposed to German measles and have never had them.   You are exposed to fifth disease or chickenpox.   You develop belly (abdominal) pain. Round ligament discomfort is a common non-cancerous (benign) cause of abdominal pain in pregnancy. Your caregiver still must evaluate you.   You develop a bad headache that does not go away.   You develop fever, diarrhea, pain with urination, or shortness of breath.   You develop visual problems, blurry, or double vision.   You fall or are in a car accident or any kind of trauma.   There is mental or physical violence at home.  Document Released: 01/03/2001 Document Revised: 09/21/2010 Document Reviewed: 07/08/2008 [employer] Patient Information 2012 [employer]. Birth Control Choices Birth control is the use of any practices, methods, or devices to prevent pregnancy from happening in a sexually active woman.  Below are some birth control choices to help avoid  pregnancy.  Not having sex (abstinence) is the surest form of birth control. This requires self-control. There is no risk of acquiring a sexually transmitted disease (STD), including acquired immunodeficiency syndrome (AIDS).   Periodic abstinence requires self-control during certain times of the month.   Calendar method, timing your menstrual periods from month to month.   Ovulation method is avoiding sexual intercourse around the time you produce an egg (ovulate).   Symptotherm method is avoiding sexual intercourse at the time of ovulation, using a thermometer and ovulation symptoms.   Post ovulation method is the timing of sexual intercourse after you ovulated.  These methods do not protect against STDs, including AIDS.  Birth control pills (BCPs) contain estrogen and progesterone hormone. These medicines work by stopping the egg from forming in the ovary (ovulation). Birth control pills are prescribed by a caregiver who will ask you questions about the risks of taking BCPs. Birth control pills do not protect against STDs, including AIDS.   "Minipill" birth control pills have only the progesterone hormone. They are taken every day of each month and must be prescribed by your caregiver. They do not protect against STDs, including AIDS.   Emergency contraception is often call the "morning after" pill. This pill can be taken right after sex or up to five days after sex if you think your birth control failed, you failed to use contraception, or you were forced to have sex. It is most effective the sooner you take the pills after having sexual intercourse. Do not use emergency contraception as your only form of birth control. Emergency contraceptive pills are available without a prescription. Check with your pharmacist.   Condoms are a thin sheath of latex, synthetic material, or lambskin worn over the penis during sexual intercourse. They can have a spermicide in or on them when you buy them.  Latex condoms can prevent pregnancy and STDs. "Natural" or lambskin   condoms can prevent pregnancy but may not protect against STDs, including AIDS.   Female condoms are a soft, loose-fitting sheath that is put into the vagina before sexual intercourse. They can prevent pregnancy and STDs, including AIDS.   Sponge is a soft, circular piece of polyurethane foam with spermicide in it that is inserted into the vagina after wetting it and before sexual intercourse. It does not require a prescription from your caregiver. It does not protect against STDs, including AIDS.   Diaphragm is a soft, latex, dome-shaped barrier that must be fitted by a caregiver. It is inserted into the vagina, along with a spermicidal jelly. After the proper fitting for a diaphragm, always insert the diaphragm before intercourse. The diaphragm should be left in the vagina for 6 to 8 hours after intercourse. Removal and reinsertion with a spermicide is always necessary after any use. It does not protect against STDs, including AIDS.   Progesterone-only injections are given every 3 months to prevent pregnancy. These injections contain synthetic progesterone and no estrogen. This hormone stops the ovaries from releasing eggs. It also causes the cervical mucus to thicken and changes the uterine lining. This makes it harder for sperm to survive in the uterus. It does not protect against STDs, including AIDS.   Birth Control Patch contains hormones similar to those in birth control pills, so effectiveness, risks, and side effects are similar. It must be changed once a week and is prescribed by a caregiver. It is less effective in very overweight women. It does not protect against STDs, including AIDS.   Vaginal Ring contains hormones similar to those in birth control pills. It is left in place for 3 weeks, removed for 1 week, and then a new one is put back into the vagina. It comes with a timer to put in your purse to help you remember when  to take it out or put a new one in. A caregiver's examination and prescription is necessary, just like with birth control pills and the patch. It does not protect against STDs, including AIDS.   Estrogen plus progesterone injections are given every 28 to 30 days. They can be given in the upper arm, thigh, or buttocks. It does not protect against STDs, including AIDS.   Intrauterine device (IUD): copper T or progestin filled is a T-shaped device that is put in a woman's uterus during a menstrual period to prevent pregnancy. The copper T IUD can last 10 years, and the progestin IUD can last 5 years. The progestin IUD can also help control heavy menstrual periods. It does not protect against STDs, including AIDS. The copper T IUD can be used as emergency contraception if inserted within 5 days of having unprotected intercourse.   Cervical cap is a round, soft latex or plastic cup that fits over the cervix and must be fitted by a caregiver. You do not need to use a spermicide with it or remove and insert it every time you have sexual intercourse. It does not protect against STDs, including AIDS.   Spermicides are chemicals that kill or block sperm from entering the cervix and uterus. They come in the form of creams, jellies, suppositories, foam, or tablets, and they do not require a prescription. They are inserted into the vagina with an applicator before having sexual intercourse. This must be repeated every time you have sexual intercourse.   Withdrawal is using the method of the female withdrawing his penis from sexual intercourse before he has a climax   and deposits his sperm. It does not protect against STDs, including AIDS.   Female tubal ligation is when the woman's fallopian tubes are surgically sealed or tied to prevent the egg from traveling to the uterus. It does not protect against STDs, including AIDS.   Female sterilization is when the female has his tubes that carry sperm tied off (vasectomy) to  stop sperm from entering the vagina during sexual intercourse. It does not protect against STDs, including AIDS.  Regardless of which method of birth control you choose, it is still important that you use some form of protection against STDs. Document Released: 01/09/2005 Document Revised: 02/11/2010 Document Reviewed: 11/26/2008 [employer] Patient Information 2012 [employer]. Breastfeeding BENEFITS OF BREASTFEEDING For the baby  The first milk (colostrum) helps the baby's digestive system function better.   There are antibodies from the mother in the milk that help the baby fight off infections.   The baby has a lower incidence of asthma, allergies, and SIDS (sudden infant death syndrome).   The nutrients in breast milk are better than formulas for the baby and helps the baby's brain grow better.   Babies who breastfeed have less gas, colic, and constipation.  For the mother  Breastfeeding helps develop a very special bond between mother and baby.   It is more convenient, always available at the correct temperature and cheaper than formula feeding.   It burns calories in the mother and helps with losing weight that was gained during pregnancy.   It makes the uterus contract back down to normal size faster and slows bleeding following delivery.   Breastfeeding mothers have a lower risk of developing breast cancer.  NURSE FREQUENTLY  A healthy, full-term baby may breastfeed as often as every hour or space his or her feedings to every 3 hours.   How often to nurse will vary from baby to baby. Watch your baby for signs of hunger, not the clock.   Nurse as often as the baby requests, or when you feel the need to reduce the fullness of your breasts.   Awaken the baby if it has been 3 to 4 hours since the last feeding.   Frequent feeding will help the mother make more milk and will prevent problems like sore nipples and engorgement of the breasts.  BABY'S POSITION AT THE  BREAST  Whether lying down or sitting, be sure that the baby's tummy is facing your tummy.   Support the breast with 4 fingers underneath the breast and the thumb above. Make sure your fingers are well away from the nipple and baby's mouth.   Stroke the baby's lips and cheek closest to the breast gently with your finger or nipple.   When the baby's mouth is open wide enough, place all of your nipple and as much of the dark area around the nipple as possible into your baby's mouth.   Pull the baby in close so the tip of the nose and the baby's cheeks touch the breast during the feeding.  FEEDINGS  The length of each feeding varies from baby to baby and from feeding to feeding.   The baby must suck about 2 to 3 minutes for your milk to get to him or her. This is called a "let down." For this reason, allow the baby to feed on each breast as long as he or she wants. Your baby will end the feeding when he or she has received the right balance of nutrients.   To   break the suction, put your finger into the corner of the baby's mouth and slide it between his or her gums before removing your breast from his or her mouth. This will help prevent sore nipples.  REDUCING BREAST ENGORGEMENT  In the first week after your baby is born, you may experience signs of breast engorgement. When breasts are engorged, they feel heavy, warm, full, and may be tender to the touch. You can reduce engorgement if you:   Nurse frequently, every 2 to 3 hours. Mothers who breastfeed early and often have fewer problems with engorgement.   Place light ice packs on your breasts between feedings. This reduces swelling. Wrap the ice packs in a lightweight towel to protect your skin.   Apply moist hot packs to your breast for 5 to 10 minutes before each feeding. This increases circulation and helps the milk flow.   Gently massage your breast before and during the feeding.   Make sure that the baby empties at least one breast  at every feeding before switching sides.   Use a breast pump to empty the breasts if your baby is sleepy or not nursing well. You may also want to pump if you are returning to work or or you feel you are getting engorged.   Avoid bottle feeds, pacifiers or supplemental feedings of water or juice in place of breastfeeding.   Be sure the baby is latched on and positioned properly while breastfeeding.   Prevent fatigue, stress, and anemia.   Wear a supportive bra, avoiding underwire styles.   Eat a balanced diet with enough fluids.  If you follow these suggestions, your engorgement should improve in 24 to 48 hours. If you are still experiencing difficulty, call your lactation consultant or caregiver. IS MY BABY GETTING ENOUGH MILK? Sometimes, mothers worry about whether their babies are getting enough milk. You can be assured that your baby is getting enough milk if:  The baby is actively sucking and you hear swallowing.   The baby nurses at least 8 to 12 times in a 24 hour time period. Nurse your baby until he or she unlatches or falls asleep at the first breast (at least 10 to 20 minutes), then offer the second side.   The baby is wetting 5 to 6 disposable diapers (6 to 8 cloth diapers) in a 24 hour period by 5 to 6 days of age.   The baby is having at least 2 to 3 stools every 24 hours for the first few months. Breast milk is all the food your baby needs. It is not necessary for your baby to have water or formula. In fact, to help your breasts make more milk, it is best not to give your baby supplemental feedings during the early weeks.   The stool should be soft and yellow.   The baby should gain 4 to 7 ounces per week after he is 4 days old.  TAKE CARE OF YOURSELF Take care of your breasts by:  Bathing or showering daily.   Avoiding the use of soaps on your nipples.   Start feedings on your left breast at one feeding and on your right breast at the next feeding.   You will  notice an increase in your milk supply 2 to 5 days after delivery. You may feel some discomfort from engorgement, which makes your breasts very firm and often tender. Engorgement "peaks" out within 24 to 48 hours. In the meantime, apply warm moist towels to your   breasts for 5 to 10 minutes before feeding. Gentle massage and expression of some milk before feeding will soften your breasts, making it easier for your baby to latch on. Wear a well fitting nursing bra and air dry your nipples for 10 to 15 minutes after each feeding.   Only use cotton bra pads.   Only use pure lanolin on your nipples after nursing. You do not need to wash it off before nursing.  Take care of yourself by:   Eating well-balanced meals and nutritious snacks.   Drinking milk, fruit juice, and water to satisfy your thirst (about 8 glasses a day).   Getting plenty of rest.   Increasing calcium in your diet (1200 mg a day).   Avoiding foods that you notice affect the baby in a bad way.  SEEK MEDICAL CARE IF:   You have any questions or difficulty with breastfeeding.   You need help.   You have a hard, red, sore area on your breast, accompanied by a fever of 100.5 F (38.1 C) or more.   Your baby is too sleepy to eat well or is having trouble sleeping.   Your baby is wetting less than 6 diapers per day, by 5 days of age.   Your baby's skin or white part of his or her eyes is more yellow than it was in the hospital.   You feel depressed.  Document Released: 01/09/2005 Document Revised: 09/21/2010 Document Reviewed: 08/24/2008 Exit[employer] Patient Information 2012 [employer]. 

## 2011-01-30 NOTE — Progress Notes (Signed)
Addended by: Lynnell Dike on: 01/30/2011 11:47 AM   Modules accepted: Orders

## 2011-01-30 NOTE — Progress Notes (Signed)
Diabetes Education:  Completed review of the GDM diet.  Had GDM with her last pregnancy of 8 months ago.  Reviewed the diet, exchange list, CHO intake recommendations.  Provided handout Nutrition, Diabetes and Pregnancy, handout, Carbohydrate counts.  Provided the Medicaid Accu-Chek SmartView meter kit and oriented her to its use.  She completed a return demonstration for blood glucose monitoring.  Fasting glucose was 68 mg/dl.  Instructed to monitor fasting and 2 hour blood glucose levels, record levels and bring her book and meter to each clinic appointment.  Maggie Eleno Weimar, RN, RD, CDE.

## 2011-01-30 NOTE — Progress Notes (Signed)
Pt stated that needs her ear checked and otic soln. Given gave her headaches so pt stopped taking it.  Also had swelling on the side of the mouth.  Pain-cramps lower abd.  Pulse-109 Pt had flu vaccine at [facility].

## 2011-01-30 NOTE — Progress Notes (Signed)
U/S scheduled 02/20/11 at 930 am. Fetal Echo scheduled 02/06/11 at 1230 am. Auth# [id number] with Dr. Elizebeth Brooking. [facility] appt. 03/02/11 at 1045 am.

## 2011-02-01 LAB — CREATININE CLEARANCE, URINE, 24 HOUR
Creatinine, 24H Ur: 1394 mg/d (ref 700–1800)
Creatinine, Urine: 99.6 mg/dL
Creatinine: 0.44 mg/dL — ABNORMAL LOW (ref 0.50–1.10)

## 2011-02-01 LAB — PROTEIN, URINE, 24 HOUR
Protein, 24H Urine: 56 mg/d (ref 50–100)
Protein, Urine: 4 mg/dL

## 2011-02-13 ENCOUNTER — Ambulatory Visit (INDEPENDENT_AMBULATORY_CARE_PROVIDER_SITE_OTHER): Payer: Medicaid Other | Admitting: Advanced Practice Midwife

## 2011-02-13 ENCOUNTER — Other Ambulatory Visit: Payer: Self-pay

## 2011-02-13 DIAGNOSIS — O0990 Supervision of high risk pregnancy, unspecified, unspecified trimester: Secondary | ICD-10-CM

## 2011-02-13 DIAGNOSIS — R030 Elevated blood-pressure reading, without diagnosis of hypertension: Secondary | ICD-10-CM | POA: Insufficient documentation

## 2011-02-13 DIAGNOSIS — J45909 Unspecified asthma, uncomplicated: Secondary | ICD-10-CM

## 2011-02-13 DIAGNOSIS — O24919 Unspecified diabetes mellitus in pregnancy, unspecified trimester: Secondary | ICD-10-CM

## 2011-02-13 DIAGNOSIS — O09899 Supervision of other high risk pregnancies, unspecified trimester: Secondary | ICD-10-CM

## 2011-02-13 LAB — POCT URINALYSIS DIP (DEVICE)
Bilirubin Urine: NEGATIVE
Glucose, UA: NEGATIVE mg/dL
Hgb urine dipstick: NEGATIVE
Ketones, ur: NEGATIVE mg/dL
Nitrite: NEGATIVE
Protein, ur: NEGATIVE mg/dL
Specific Gravity, Urine: 1.02 (ref 1.005–1.030)
Urobilinogen, UA: 0.2 mg/dL (ref 0.0–1.0)
pH: 7 (ref 5.0–8.0)

## 2011-02-13 NOTE — Progress Notes (Signed)
Patient has a history of gestational diabetes in first pregnancy and presents to [facility] for pre-natal care.  She had a 1 hour GTT of 145 at the [facility] on 01/31/11 and an elevated 3 hour result, the record of which has been requested.  She is not currently checking blood sugars but plans to pick up her monitor and test strips now that she has insurance and begin recording her values.  Fetal echo with Dr. Elizebeth Brooking completed on 1/14 (result not available) and anatomy ultrasound is scheduled for 1/28.  Opthalmology appointment with [facility] on 03/02/11.  Reports occasional headaches which are relieved by rest, food, and water, and intermittent back soreness.  Denies blurry vision, chest pain, shortness of breath, and leg pain.  Will follow up in 2 weeks to review blood sugar recordings and anatomy ultrasound.

## 2011-02-13 NOTE — Progress Notes (Signed)
Reports edema traces in face. Called [facility] (office closed for [facility] holiday) and left message requesting for 3 hr GTT test RESULT faxed to [place] per Dorathy Kinsman, CNMM.

## 2011-02-14 DIAGNOSIS — O24919 Unspecified diabetes mellitus in pregnancy, unspecified trimester: Secondary | ICD-10-CM

## 2011-02-14 DIAGNOSIS — O0990 Supervision of high risk pregnancy, unspecified, unspecified trimester: Secondary | ICD-10-CM

## 2011-02-20 ENCOUNTER — Ambulatory Visit (HOSPITAL_COMMUNITY)
Admission: RE | Admit: 2011-02-20 | Discharge: 2011-02-20 | Disposition: A | Discharge status: Home / Self Care | Payer: Medicaid Other | Source: Ambulatory Visit | Attending: Family Medicine | Admitting: Family Medicine

## 2011-02-20 ENCOUNTER — Encounter: Payer: Self-pay | Admitting: Family Medicine

## 2011-02-20 DIAGNOSIS — O358XX0 Maternal care for other (suspected) fetal abnormality and damage, not applicable or unspecified: Secondary | ICD-10-CM | POA: Insufficient documentation

## 2011-02-20 DIAGNOSIS — O09299 Supervision of pregnancy with other poor reproductive or obstetric history, unspecified trimester: Secondary | ICD-10-CM | POA: Insufficient documentation

## 2011-02-20 DIAGNOSIS — Z363 Encounter for antenatal screening for malformations: Secondary | ICD-10-CM | POA: Insufficient documentation

## 2011-02-20 DIAGNOSIS — Z1389 Encounter for screening for other disorder: Secondary | ICD-10-CM | POA: Insufficient documentation

## 2011-02-20 DIAGNOSIS — Z8632 Personal history of gestational diabetes: Secondary | ICD-10-CM

## 2011-02-20 DIAGNOSIS — O0990 Supervision of high risk pregnancy, unspecified, unspecified trimester: Secondary | ICD-10-CM

## 2011-02-20 DIAGNOSIS — O99810 Abnormal glucose complicating pregnancy: Secondary | ICD-10-CM | POA: Insufficient documentation

## 2011-02-20 IMAGING — US US OB DETAIL+14 WK
1 series · 12 of 28 positions shown · non-contrast
Comparison: none

[Series 1: us ob detail +14 wk · 74 acquisitions, 12 frames shown]
[im 3/74]
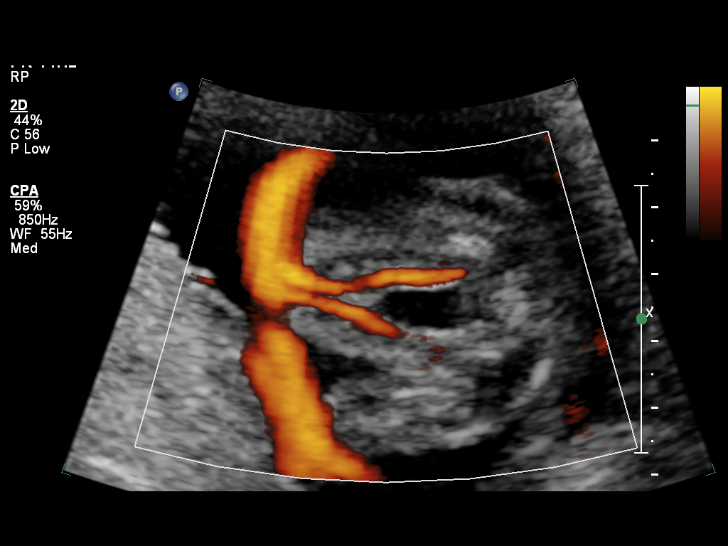
[im 9/74]
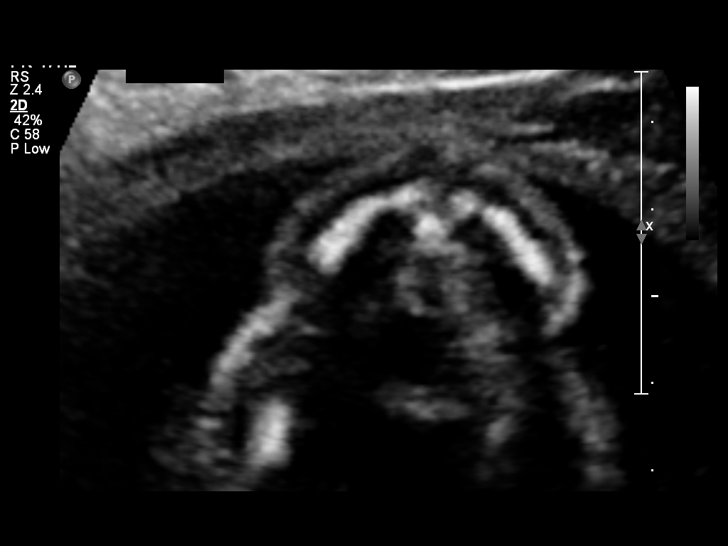
[im 14/74]
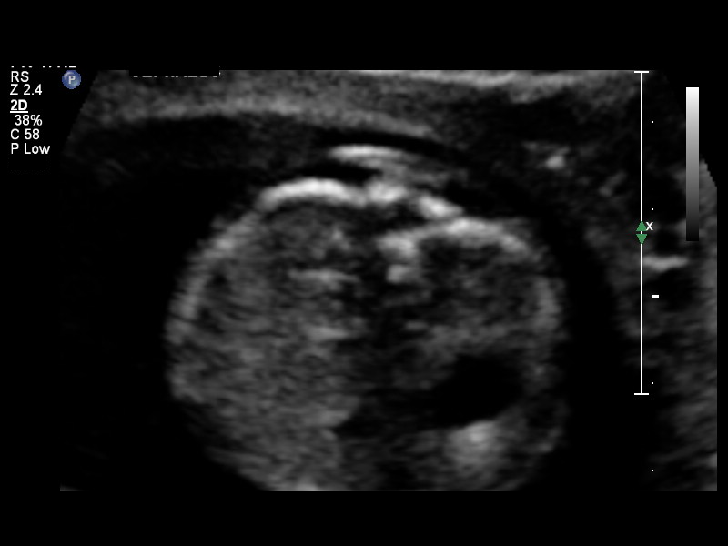
[im 22/74]
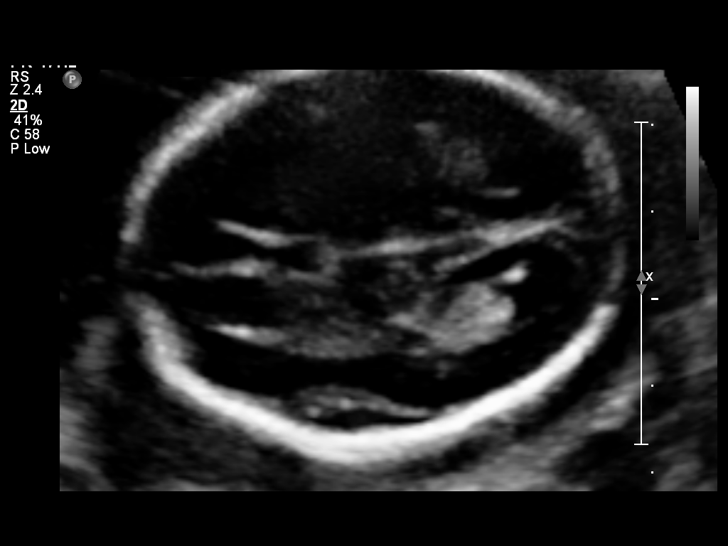
[im 28/74]
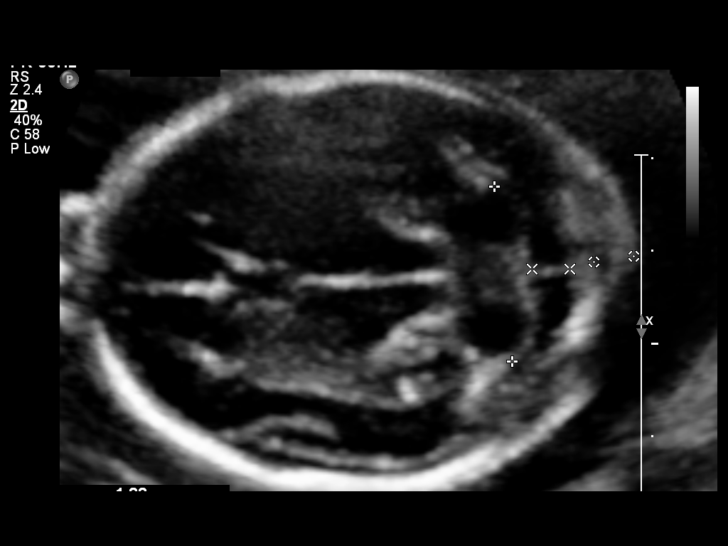
[im 33/74]
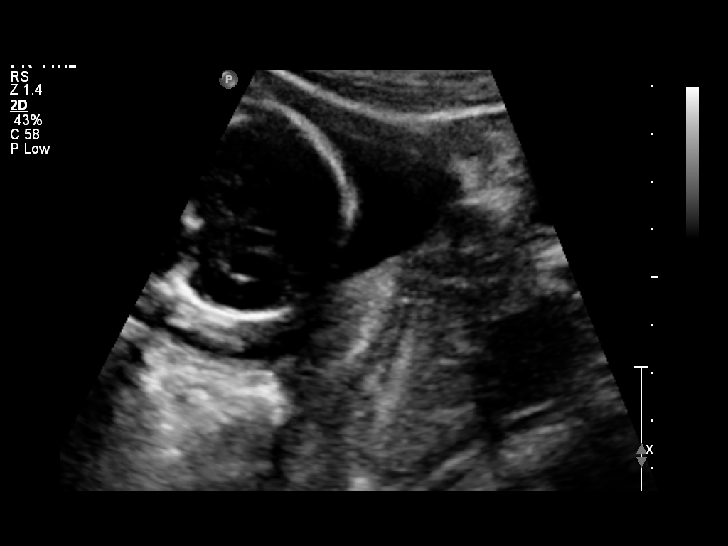
[im 41/74]
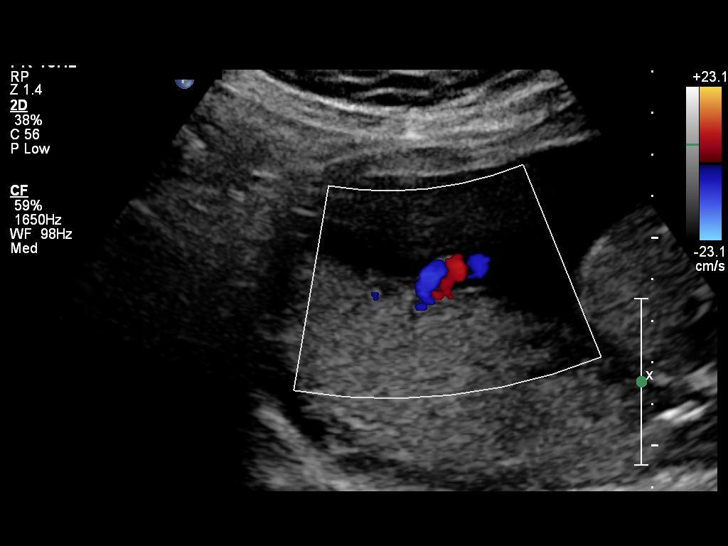
[im 46/74]
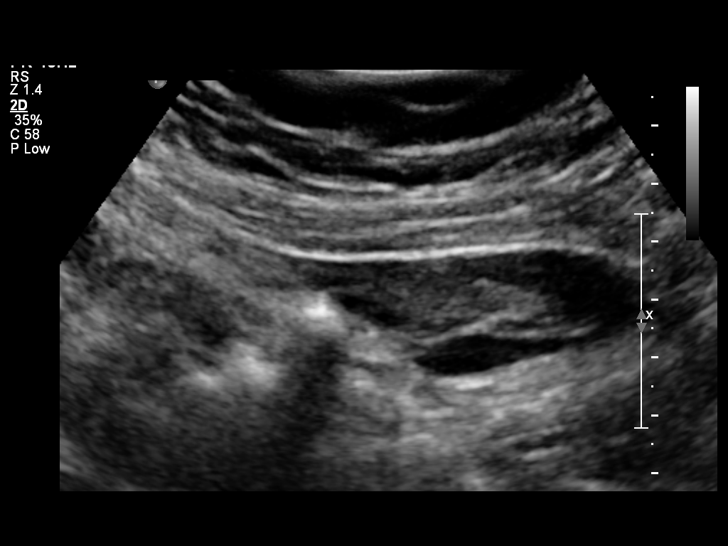
[im 52/74]
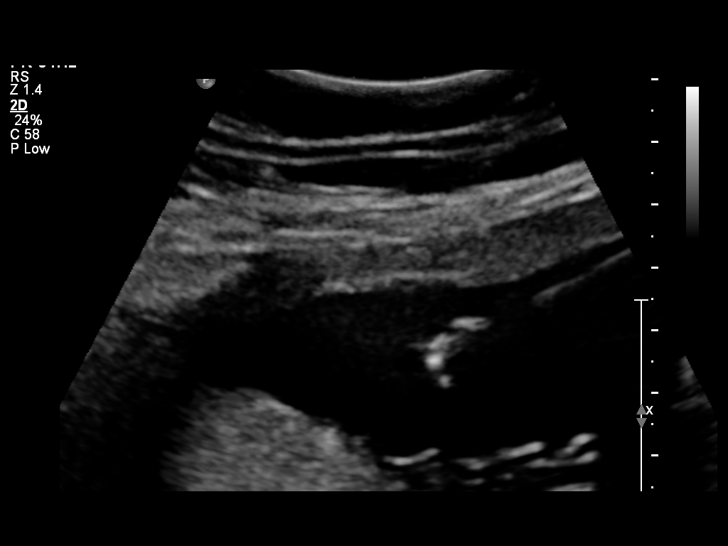
[im 60/74]
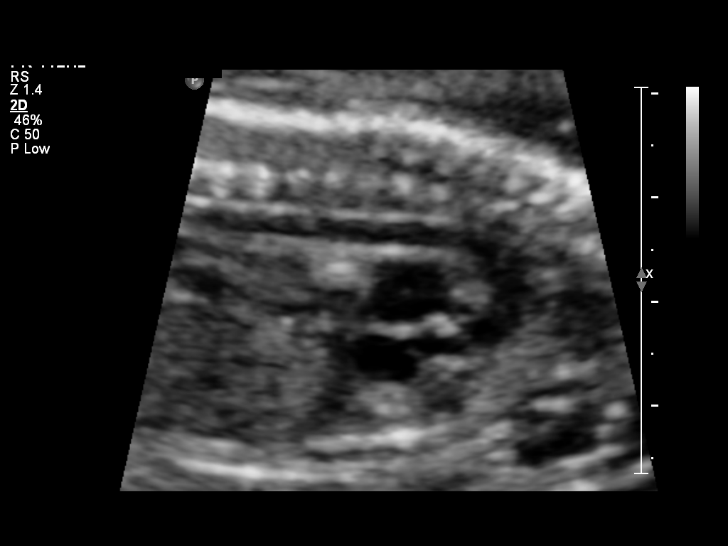
[im 65/74]
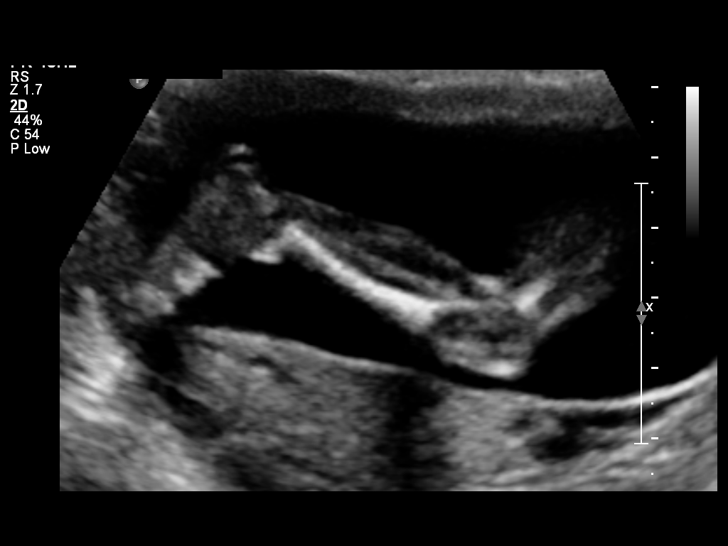
[im 71/74]
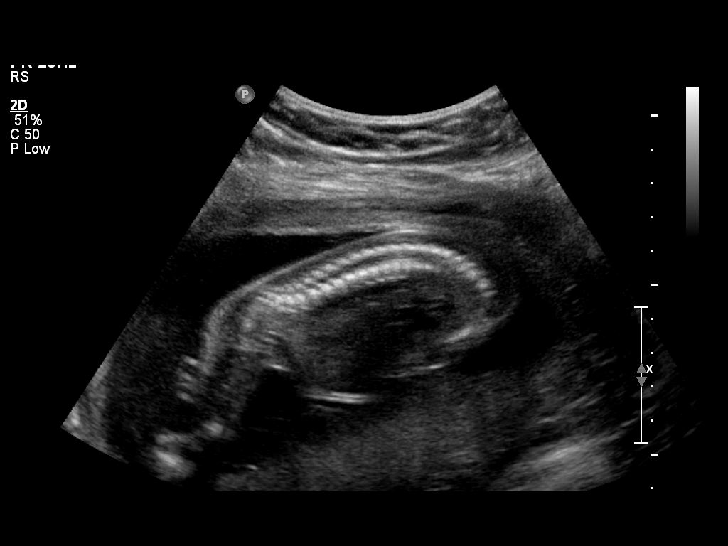

[12 of 28 positions shown; findings below may reference images not displayed]

OBSTETRICS REPORT
                      (Signed Final [DATE] [DATE])

 Order#:         [PHONE_NUMBER]_O
Procedures

 US OB DETAIL + 14 WK                                  [id number]
Indications

 Poor obstetric history: Previous gestational          [N8]
 diabetes
 Detailed fetal anatomic survey                        655.83 [N8]
 Diabetes - Gestational, A2
Fetal Evaluation

 Preg. Location:    Intrauterine
 Fetal Heart Rate:  133                         bpm
 Cardiac Activity:  Observed
 Presentation:      Cephalic
 Placenta:          Posterior, above cervical
                    os
 P. Cord            Visualized, central
 Insertion:

 Amniotic Fluid
 AFI FV:      Subjectively within normal limits
                                             Larg Pckt:   3.11   cm
 RLQ:   3.11   cm
Biometry

 BPD:     44.3  mm    G. Age:   19w 3d                CI:        71.96   70 - 86
                                                      FL/HC:      18.0   16.8 -

 HC:     166.2  mm    G. Age:   19w 2d       31  %    HC/AC:      1.10   1.09 -

 AC:     150.5  mm    G. Age:   20w 2d       68  %    FL/BPD:
 FL:      29.9  mm    G. Age:   19w 2d       31  %    FL/AC:      19.9   20 - 24
 HUM:     28.8  mm    G. Age:   19w 2d       46  %
 NFT:     4.36  mm

 Est. FW:     310  gm    0 lb 11 oz      51  %
Gestational Age

 LMP:           19w 4d       Date:   [DATE]                 EDD:   [DATE]
 U/S Today:     19w 4d                                        EDD:   [DATE]
 Best:          19w 4d    Det. By:   LMP  ([DATE])          EDD:   [DATE]
Genetic Sonogram - Trisomy 21 Screening

 Age:                                             25          Risk=1:   826
 Echogenic bowel:                                 No          LR :
 Choroid plexus cysts:                            No
 Structural anomalies (inc. cardiac):             No          LR :
 Short femur:                                     No          LR :
 Short humerus:                                   No          LR :
 2-vessel umbilical cord:                         No
 Pyelectasis:                                     No          LR :
 Echogenic cardiac foci:                          No          LR :
 Nuchal fold thickening >= 6 mm:                  No          LR :

 9 Of 9 Criteria Were Visualized and 0 Abnormal(s) Were Seen.
 Ultrasound Modified Risk for Fetal Down Syndrome = [DATE]
Anatomy

 Cranium:           Appears normal      Aortic Arch:       Appears normal
 Fetal Cavum:       Appears normal      Ductal Arch:       Appears normal
 Ventricles:        Appears normal      Diaphragm:         Appears normal
 Choroid Plexus:    Appears normal      Stomach:           Appears normal
 Cerebellum:        Appears normal      Abdomen:           Appears normal
 Posterior Fossa:   Appears normal      Abdominal Wall:    Appears nml
                                                           (cord insert,
                                                           abd wall)
 Nuchal Fold:       Appears normal      Cord Vessels:      Appears normal
                    (neck, nuchal                          (3 vessel cord)
                    fold)
 Face:              Appears normal      Kidneys:           Appear normal
                    (lips/profile/orbit
                    s)
 Heart:             Appears normal      Bladder:           Appears normal
                    (4 chamber &
                    axis)
 RVOT:              Appears normal      Spine:             Appears normal
 LVOT:              Appears normal      Limbs:             Four extremities
                                                           seen

 Other:     Fetus appears to be a female. Heels and 5th digit
            visualized. Nasal bone visualized.
Cervix Uterus Adnexa

 Cervical Length:   5.51      cm

 Cervix:       Normal appearance by transabdominal scan.
 Uterus:       No abnormality visualized.
 Cul De Sac:   No free fluid seen.
 Left Ovary:   Size(cm) L: 3.24 x W: 1.9 x H: 1.22  Volume(cc):
               Within normal limits.
 Right Ovary:  Size(cm) L: 4.24 x W: 2.02 x H: 1.95  Volume(cc):
               Within normal limits.

 Adnexa:     No abnormality visualized.
Impression

 Single live IUP in cephalic presentation.  Concordant
 measurements/assigned GA by LMP.
 No anatomic abnormality seen with a good quality survey
 possible.

## 2011-03-05 ENCOUNTER — Encounter (HOSPITAL_COMMUNITY): Payer: Self-pay | Admitting: *Deleted

## 2011-03-05 ENCOUNTER — Inpatient Hospital Stay (HOSPITAL_COMMUNITY)
Admission: AD | Admit: 2011-03-05 | Discharge: 2011-03-05 | Disposition: A | Discharge status: Home / Self Care | Payer: Medicaid Other | Source: Ambulatory Visit | Attending: Obstetrics and Gynecology | Admitting: Obstetrics and Gynecology

## 2011-03-05 DIAGNOSIS — O98819 Other maternal infectious and parasitic diseases complicating pregnancy, unspecified trimester: Secondary | ICD-10-CM | POA: Insufficient documentation

## 2011-03-05 DIAGNOSIS — R030 Elevated blood-pressure reading, without diagnosis of hypertension: Secondary | ICD-10-CM

## 2011-03-05 DIAGNOSIS — A5901 Trichomonal vulvovaginitis: Secondary | ICD-10-CM | POA: Insufficient documentation

## 2011-03-05 DIAGNOSIS — E109 Type 1 diabetes mellitus without complications: Secondary | ICD-10-CM | POA: Insufficient documentation

## 2011-03-05 DIAGNOSIS — A599 Trichomoniasis, unspecified: Secondary | ICD-10-CM

## 2011-03-05 DIAGNOSIS — O24919 Unspecified diabetes mellitus in pregnancy, unspecified trimester: Secondary | ICD-10-CM | POA: Insufficient documentation

## 2011-03-05 HISTORY — DX: Sickle-cell trait: D57.3

## 2011-03-05 LAB — URINALYSIS, ROUTINE W REFLEX MICROSCOPIC
Glucose, UA: NEGATIVE mg/dL
Ketones, ur: NEGATIVE mg/dL
Protein, ur: NEGATIVE mg/dL
pH: 6 (ref 5.0–8.0)

## 2011-03-05 LAB — WET PREP, GENITAL: Yeast Wet Prep HPF POC: NONE SEEN

## 2011-03-05 LAB — URINE MICROSCOPIC-ADD ON

## 2011-03-05 MED ORDER — METRONIDAZOLE 500 MG PO TABS: 500.0000 mg | ORAL_TABLET | Freq: Two times a day (BID) | ORAL | Status: AC

## 2011-03-05 NOTE — ED Notes (Signed)
Pt called, not in lobby 

## 2011-03-05 NOTE — Progress Notes (Signed)
Pt c/o yellow/white vaginal discharge and itching x 2 days. Reports irritation in vaginal area.

## 2011-03-05 NOTE — ED Provider Notes (Signed)
History   Jill Clarke is a 26 y.o. G2P1001 at [redacted]w[redacted]d who reports vaginal itching and discharge w/ whitish-yellow vag d/c x 2 days.  Reports +fm, denies VB, LOF, or UCs.  Next appointment in [facility] tomorrow.    No chief complaint on file.  HPI  OB History    Grav Para Term Preterm Abortions TAB SAB Ect Mult Living   2 1 1  0 0 0 0 0 0 1      Past Medical History  Diagnosis Date   Asthma    Gestational diabetes     GDM   Unspecified high-risk pregnancy 01/27/2011   Sickle cell trait     Past Surgical History  Procedure Date   Tonsillectomy     Family History  Problem Relation Age of Onset   Sickle cell trait Mother    Depression Mother    Asthma Father    Drug abuse Father     heroin abuse   Kidney disease Father     due to heroin abuse   Diabetes Maternal Grandmother     History  Substance Use Topics   Smoking status: Never Smoker    Smokeless tobacco: Never Used   Alcohol Use: No    Allergies: No Known Allergies  Prescriptions prior to admission  Medication Sig Dispense Refill   acetaminophen (TYLENOL) 325 MG tablet Take 325 mg by mouth every 6 (six) hours as needed. Head aches       calcium carbonate (TUMS - DOSED IN MG ELEMENTAL CALCIUM) 500 MG chewable tablet Chew 1 tablet by mouth 2 (two) times daily as needed.        Prenatal Vit-Fe Fumarate-FA (PRENATAL MULTIVITAMIN) TABS Take 1 tablet by mouth daily.       ACCU-CHEK FASTCLIX LANCETS MISC 1 Units by Percutaneous route 4 (four) times daily.  100 each  12   Blood Glucose Monitoring Suppl (ACCU-CHEK NANO SMARTVIEW) W/DEVICE KIT 1 Units by Does not apply route as directed. Check fasting, and two hours after breakfast, lunch and dinner  1 kit  0   glucose blood test strip 1 each 4 (four) times daily. Use as instructed to check blood sugars       DISCONTD: glucose blood (ACCU-CHEK SMARTVIEW) test strip Use as instructed to check blood sugars  100 each  12    Review of Systems    Constitutional: Negative.   HENT: Negative.   Eyes: Negative.   Respiratory: Negative.   Cardiovascular: Negative.   Gastrointestinal: Negative.   Genitourinary: Negative for dysuria, urgency, frequency and hematuria.       Vaginal itching and irritation w/ whitish-yellow vag d/c x 2 days  Musculoskeletal: Negative.   Skin: Negative.   Neurological: Negative.   Endo/Heme/Allergies: Negative.   Psychiatric/Behavioral: Negative.    Physical Exam   Blood pressure 99/61, pulse 102, temperature 98.8 F (37.1 C), temperature source Oral, resp. rate 18, height 5\' 1"  (1.549 m), weight 77.747 kg (171 lb 6.4 oz), last menstrual period 10/06/2010, unknown if currently breastfeeding.  Physical Exam  Constitutional: She is oriented to person, place, and time. She appears well-developed and well-nourished.  HENT:  Head: Normocephalic.  Eyes: Pupils are equal, round, and reactive to light.  Neck: Normal range of motion.  Cardiovascular: Normal rate and regular rhythm.   Respiratory: Effort normal and breath sounds normal.  GI: Soft.  Genitourinary: Vaginal discharge (moderate amount thin whitish-yellow d/c ) found.  Musculoskeletal: Normal range of motion.  Neurological: She is alert  and oriented to person, place, and time. She has normal reflexes.  Skin: Skin is warm and dry.  Psychiatric: She has a normal mood and affect. Her behavior is normal. Judgment and thought content normal.   Doppler FHT 130 UA: +for leukocytes, bacteria, and hemoglobin. - for nitrites Wet prep: moderate trichomonads, many wbc's MAU Course  Procedures  Doppler FHT  Wet prep UA  Assessment and Plan  A: IUP @ 21.3wks     Trichomonas     A1DM     FHT 130 via doppler  P: Flagyl 500mg  po BID x 7d     Have partner get treated     Keep appt in [facility] tomorrow     Return to MAU for s/s ptl, srom, vb, decreased fm or other concerns  Wet prep & UA results, as well as POC reviewed w/ Zerita Boers, CNM  Joellyn Haff, SNM 03/05/2011, 8:59 PM

## 2011-03-06 ENCOUNTER — Telehealth: Payer: Self-pay | Admitting: *Deleted

## 2011-03-06 DIAGNOSIS — R030 Elevated blood-pressure reading, without diagnosis of hypertension: Secondary | ICD-10-CM

## 2011-03-06 DIAGNOSIS — O24919 Unspecified diabetes mellitus in pregnancy, unspecified trimester: Secondary | ICD-10-CM

## 2011-03-06 NOTE — Telephone Encounter (Signed)
Pt was late for her appt today and told the front desk that she needs a nurse to call her because she has questions.

## 2011-03-07 NOTE — Telephone Encounter (Signed)
Returned patients call a man answered and said he was not with the patient but would tell her to call us back later.

## 2011-03-08 NOTE — ED Provider Notes (Signed)
Attestation of Attending Supervision of Advanced Practitioner: Evaluation and management procedures were performed by the PA/NP/CNM/OB Fellow under my supervision/collaboration. Chart reviewed and agree with management and plan.  Bolivar Koranda V 03/08/2011 4:09 PM

## 2011-03-09 NOTE — Telephone Encounter (Signed)
Telephoned pt. Pt states had spoken with nurse and all questions were answered.

## 2011-03-20 ENCOUNTER — Ambulatory Visit (INDEPENDENT_AMBULATORY_CARE_PROVIDER_SITE_OTHER): Payer: Medicaid Other | Admitting: Family

## 2011-03-20 VITALS — BP 120/83 | Temp 98.2°F | Wt 169.9 lb

## 2011-03-20 DIAGNOSIS — O24919 Unspecified diabetes mellitus in pregnancy, unspecified trimester: Secondary | ICD-10-CM

## 2011-03-20 LAB — POCT URINALYSIS DIP (DEVICE)
Bilirubin Urine: NEGATIVE
Glucose, UA: NEGATIVE mg/dL
Specific Gravity, Urine: 1.02 (ref 1.005–1.030)
pH: 7 (ref 5.0–8.0)

## 2011-03-20 NOTE — Progress Notes (Signed)
Not checking blood sugars due to being busy; reviewed risk of poor birth outcome if blood sugars not controlled, including fetal death; agreed to check at least FBS and 2hr PP dinner; requests STD screen due to frequent trich after treatment; just completed treatment on Friday, no sexual intercourse since treatment.  GC/CT, wet prep today; FBS today 87.

## 2011-03-20 NOTE — Progress Notes (Signed)
Edema- feet/hands.  Pulse-116 Pt c/o "vagina feeling swelling" Pt requests a full STD testing.

## 2011-03-21 LAB — HIV ANTIBODY (ROUTINE TESTING W REFLEX): HIV: NONREACTIVE

## 2011-03-21 LAB — WET PREP, GENITAL: Trich, Wet Prep: NONE SEEN

## 2011-03-21 LAB — GC/CHLAMYDIA PROBE AMP, GENITAL
Chlamydia, DNA Probe: NEGATIVE
GC Probe Amp, Genital: NEGATIVE

## 2011-03-27 ENCOUNTER — Ambulatory Visit (INDEPENDENT_AMBULATORY_CARE_PROVIDER_SITE_OTHER): Payer: Medicaid Other | Admitting: Obstetrics & Gynecology

## 2011-03-27 DIAGNOSIS — O0990 Supervision of high risk pregnancy, unspecified, unspecified trimester: Secondary | ICD-10-CM

## 2011-03-27 DIAGNOSIS — J45909 Unspecified asthma, uncomplicated: Secondary | ICD-10-CM

## 2011-03-27 DIAGNOSIS — R030 Elevated blood-pressure reading, without diagnosis of hypertension: Secondary | ICD-10-CM

## 2011-03-27 DIAGNOSIS — O24919 Unspecified diabetes mellitus in pregnancy, unspecified trimester: Secondary | ICD-10-CM

## 2011-03-27 DIAGNOSIS — O09899 Supervision of other high risk pregnancies, unspecified trimester: Secondary | ICD-10-CM

## 2011-03-27 LAB — POCT URINALYSIS DIP (DEVICE)
Leukocytes, UA: NEGATIVE
Nitrite: NEGATIVE
Protein, ur: NEGATIVE mg/dL
Urobilinogen, UA: 0.2 mg/dL (ref 0.0–1.0)
pH: 6 (ref 5.0–8.0)

## 2011-03-27 NOTE — Progress Notes (Signed)
Patient gave urine sample before discharge.  Trace protein, negative glucose. Continue to monitor.

## 2011-03-27 NOTE — Patient Instructions (Signed)
Return to clinic for any obstetric concerns or go to MAU for evaluation  

## 2011-03-27 NOTE — Progress Notes (Signed)
Pulse = 112 

## 2011-03-27 NOTE — Progress Notes (Signed)
No urine sample given.  Checking CBGs fasting 70-90s, and sporadic postpartum in the 110s.  Only checks one postprandial a day.  Emphasized importance of checking four times a day as uncontrolled DM can lead to increased maternal-fetal morbidity and mortality.  Negative GC/Chlam, HIV and tric results, patient reassured.  RTC in one week with meter and book. No other complaints or concerns.  Fetal movement and labor precautions reviewed.

## 2011-04-03 ENCOUNTER — Encounter: Payer: Medicaid Other | Admitting: Obstetrics & Gynecology

## 2011-05-15 ENCOUNTER — Ambulatory Visit (INDEPENDENT_AMBULATORY_CARE_PROVIDER_SITE_OTHER): Payer: Medicaid Other | Admitting: Obstetrics & Gynecology

## 2011-05-15 ENCOUNTER — Encounter: Payer: Self-pay | Admitting: Family Medicine

## 2011-05-15 VITALS — BP 112/68 | Temp 98.2°F | Wt 173.9 lb

## 2011-05-15 DIAGNOSIS — O24419 Gestational diabetes mellitus in pregnancy, unspecified control: Secondary | ICD-10-CM

## 2011-05-15 DIAGNOSIS — O0990 Supervision of high risk pregnancy, unspecified, unspecified trimester: Secondary | ICD-10-CM

## 2011-05-15 DIAGNOSIS — O99810 Abnormal glucose complicating pregnancy: Secondary | ICD-10-CM

## 2011-05-15 LAB — POCT URINALYSIS DIP (DEVICE)
Ketones, ur: NEGATIVE mg/dL
Nitrite: NEGATIVE
Protein, ur: NEGATIVE mg/dL
Urobilinogen, UA: 0.2 mg/dL (ref 0.0–1.0)
pH: 7 (ref 5.0–8.0)

## 2011-05-15 MED ORDER — PRENATAL MULTIVITAMIN CH: 1.0000 | ORAL_TABLET | Freq: Every day | ORAL | Status: DC

## 2011-05-15 NOTE — Progress Notes (Signed)
Pulse: 108.     Vaginal d/c described as white; no odor, no itch.

## 2011-05-15 NOTE — Progress Notes (Signed)
U/S scheduled 05/18/11 at 8 am.

## 2011-05-15 NOTE — Progress Notes (Signed)
Pt didn't bring CBG log.  CBG today fasting = 83.   Pt states all CBGs are WNL.  [place] for growth for SGA last baby.  Mylicon for gas.

## 2011-05-15 NOTE — Patient Instructions (Addendum)
Levonorgestrel intrauterine device (IUD) What is this medicine? LEVONORGESTREL IUD (LEE voe nor jes trel) is a contraceptive (birth control) device. It is used to prevent pregnancy and to treat heavy bleeding that occurs during your period. It can be used for up to 5 years. This medicine may be used for other purposes; ask your health care provider or pharmacist if you have questions. What should I tell my health care provider before I take this medicine? They need to know if you have any of these conditions: -abnormal Pap smear -cancer of the breast, uterus, or cervix -diabetes -endometritis -genital or pelvic infection now or in the past -have more than one sexual partner or your partner has more than one partner -heart disease -history of an ectopic or tubal pregnancy -immune system problems -IUD in place -liver disease or tumor -problems with blood clots or take blood-thinners -use intravenous drugs -uterus of unusual shape -vaginal bleeding that has not been explained -an unusual or allergic reaction to levonorgestrel, other hormones, silicone, or polyethylene, medicines, foods, dyes, or preservatives -pregnant or trying to get pregnant -breast-feeding How should I use this medicine? This device is placed inside the uterus by a health care professional. Talk to your pediatrician regarding the use of this medicine in children. Special care may be needed. Overdosage: If you think you have taken too much of this medicine contact a poison control center or emergency room at once. NOTE: This medicine is only for you. Do not share this medicine with others. What if I miss a dose? This does not apply. What may interact with this medicine? Do not take this medicine with any of the following medications: -amprenavir -bosentan -fosamprenavir This medicine may also interact with the following medications: -aprepitant -barbiturate medicines for inducing sleep or treating  seizures -bexarotene -griseofulvin -medicines to treat seizures like carbamazepine, ethotoin, felbamate, oxcarbazepine, phenytoin, topiramate -modafinil -pioglitazone -rifabutin -rifampin -rifapentine -some medicines to treat HIV infection like atazanavir, indinavir, lopinavir, nelfinavir, tipranavir, ritonavir -St. John's wort -warfarin This list may not describe all possible interactions. Give your health care provider a list of all the medicines, herbs, non-prescription drugs, or dietary supplements you use. Also tell them if you smoke, drink alcohol, or use illegal drugs. Some items may interact with your medicine. What should I watch for while using this medicine? Visit your doctor or health care professional for regular check ups. See your doctor if you or your partner has sexual contact with others, becomes HIV positive, or gets a sexual transmitted disease. This product does not protect you against HIV infection (AIDS) or other sexually transmitted diseases. You can check the placement of the IUD yourself by reaching up to the top of your vagina with clean fingers to feel the threads. Do not pull on the threads. It is a good habit to check placement after each menstrual period. Call your doctor right away if you feel more of the IUD than just the threads or if you cannot feel the threads at all. The IUD may come out by itself. You may become pregnant if the device comes out. If you notice that the IUD has come out use a backup birth control method like condoms and call your health care provider. Using tampons will not change the position of the IUD and are okay to use during your period. What side effects may I notice from receiving this medicine? Side effects that you should report to your doctor or health care professional as soon as possible: -allergic reactions   like skin rash, itching or hives, swelling of the face, lips, or tongue -fever, flu-like symptoms -genital sores -high  blood pressure -no menstrual period for 6 weeks during use -pain, swelling, warmth in the leg -pelvic pain or tenderness -severe or sudden headache -signs of pregnancy -stomach cramping -sudden shortness of breath -trouble with balance, talking, or walking -unusual vaginal bleeding, discharge -yellowing of the eyes or skin Side effects that usually do not require medical attention (report to your doctor or health care professional if they continue or are bothersome): -acne -breast pain -change in sex drive or performance -changes in weight -cramping, dizziness, or faintness while the device is being inserted -headache -irregular menstrual bleeding within first 3 to 6 months of use -nausea This list may not describe all possible side effects. Call your doctor for medical advice about side effects. You may report side effects to FDA at 1-800-FDA-1088. Where should I keep my medicine? This does not apply. NOTE: This sheet is a summary. It may not cover all possible information. If you have questions about this medicine, talk to your doctor, pharmacist, or health care provider.  2012, [employer]. (01/31/2008 6:39:08 PM)Breastfeeding BENEFITS OF BREASTFEEDING For the baby  The first milk (colostrum) helps the baby's digestive system function better.   There are antibodies from the mother in the milk that help the baby fight off infections.   The baby has a lower incidence of asthma, allergies, and SIDS (sudden infant death syndrome).   The nutrients in breast milk are better than formulas for the baby and helps the baby's brain grow better.   Babies who breastfeed have less gas, colic, and constipation.  For the mother  Breastfeeding helps develop a very special bond between mother and baby.   It is more convenient, always available at the correct temperature and cheaper than formula feeding.   It burns calories in the mother and helps with losing weight that was  gained during pregnancy.   It makes the uterus contract back down to normal size faster and slows bleeding following delivery.   Breastfeeding mothers have a lower risk of developing breast cancer.  NURSE FREQUENTLY  A healthy, full-term baby may breastfeed as often as every hour or space his or her feedings to every 3 hours.   How often to nurse will vary from baby to baby. Watch your baby for signs of hunger, not the clock.   Nurse as often as the baby requests, or when you feel the need to reduce the fullness of your breasts.   Awaken the baby if it has been 3 to 4 hours since the last feeding.   Frequent feeding will help the mother make more milk and will prevent problems like sore nipples and engorgement of the breasts.  BABY'S POSITION AT THE BREAST  Whether lying down or sitting, be sure that the baby's tummy is facing your tummy.   Support the breast with 4 fingers underneath the breast and the thumb above. Make sure your fingers are well away from the nipple and baby's mouth.   Stroke the baby's lips and cheek closest to the breast gently with your finger or nipple.   When the baby's mouth is open wide enough, place all of your nipple and as much of the dark area around the nipple as possible into your baby's mouth.   Pull the baby in close so the tip of the nose and the baby's cheeks touch the breast during the feeding.  FEEDINGS    The length of each feeding varies from baby to baby and from feeding to feeding.   The baby must suck about 2 to 3 minutes for your milk to get to him or her. This is called a "let down." For this reason, allow the baby to feed on each breast as long as he or she wants. Your baby will end the feeding when he or she has received the right balance of nutrients.   To break the suction, put your finger into the corner of the baby's mouth and slide it between his or her gums before removing your breast from his or her mouth. This will help prevent  sore nipples.  REDUCING BREAST ENGORGEMENT  In the first week after your baby is born, you may experience signs of breast engorgement. When breasts are engorged, they feel heavy, warm, full, and may be tender to the touch. You can reduce engorgement if you:   Nurse frequently, every 2 to 3 hours. Mothers who breastfeed early and often have fewer problems with engorgement.   Place light ice packs on your breasts between feedings. This reduces swelling. Wrap the ice packs in a lightweight towel to protect your skin.   Apply moist hot packs to your breast for 5 to 10 minutes before each feeding. This increases circulation and helps the milk flow.   Gently massage your breast before and during the feeding.   Make sure that the baby empties at least one breast at every feeding before switching sides.   Use a breast pump to empty the breasts if your baby is sleepy or not nursing well. You may also want to pump if you are returning to work or or you feel you are getting engorged.   Avoid bottle feeds, pacifiers or supplemental feedings of water or juice in place of breastfeeding.   Be sure the baby is latched on and positioned properly while breastfeeding.   Prevent fatigue, stress, and anemia.   Wear a supportive bra, avoiding underwire styles.   Eat a balanced diet with enough fluids.  If you follow these suggestions, your engorgement should improve in 24 to 48 hours. If you are still experiencing difficulty, call your lactation consultant or caregiver. IS MY BABY GETTING ENOUGH MILK? Sometimes, mothers worry about whether their babies are getting enough milk. You can be assured that your baby is getting enough milk if:  The baby is actively sucking and you hear swallowing.   The baby nurses at least 8 to 12 times in a 24 hour time period. Nurse your baby until he or she unlatches or falls asleep at the first breast (at least 10 to 20 minutes), then offer the second side.   The baby is  wetting 5 to 6 disposable diapers (6 to 8 cloth diapers) in a 24 hour period by 5 to 6 days of age.   The baby is having at least 2 to 3 stools every 24 hours for the first few months. Breast milk is all the food your baby needs. It is not necessary for your baby to have water or formula. In fact, to help your breasts make more milk, it is best not to give your baby supplemental feedings during the early weeks.   The stool should be soft and yellow.   The baby should gain 4 to 7 ounces per week after he is 4 days old.  TAKE CARE OF YOURSELF Take care of your breasts by:  Bathing or showering daily.     Avoiding the use of soaps on your nipples.   Start feedings on your left breast at one feeding and on your right breast at the next feeding.   You will notice an increase in your milk supply 2 to 5 days after delivery. You may feel some discomfort from engorgement, which makes your breasts very firm and often tender. Engorgement "peaks" out within 24 to 48 hours. In the meantime, apply warm moist towels to your breasts for 5 to 10 minutes before feeding. Gentle massage and expression of some milk before feeding will soften your breasts, making it easier for your baby to latch on. Wear a well fitting nursing bra and air dry your nipples for 10 to 15 minutes after each feeding.   Only use cotton bra pads.   Only use pure lanolin on your nipples after nursing. You do not need to wash it off before nursing.  Take care of yourself by:   Eating well-balanced meals and nutritious snacks.   Drinking milk, fruit juice, and water to satisfy your thirst (about 8 glasses a day).   Getting plenty of rest.   Increasing calcium in your diet (1200 mg a day).   Avoiding foods that you notice affect the baby in a bad way.  SEEK MEDICAL CARE IF:   You have any questions or difficulty with breastfeeding.   You need help.   You have a hard, red, sore area on your breast, accompanied by a fever of 100.5  F (38.1 C) or more.   Your baby is too sleepy to eat well or is having trouble sleeping.   Your baby is wetting less than 6 diapers per day, by 66 days of age.   Your baby's skin or white part of his or her eyes is more yellow than it was in the hospital.   You feel depressed.  Document Released: 01/09/2005 Document Revised: 12/29/2010 Document Reviewed: 08/24/2008 [facility] Patient Information 2012 [place], [place].Contraception Choices Contraception (birth control) is the use of any methods or devices to prevent pregnancy. Below are some methods to help avoid pregnancy. HORMONAL METHODS   Contraceptive implant. This is a thin, plastic tube containing progesterone hormone. It does not contain estrogen hormone. Your caregiver inserts the tube in the inner part of the upper arm. The tube can remain in place for up to 3 years. After 3 years, the implant must be removed. The implant prevents the ovaries from releasing an egg (ovulation), thickens the cervical mucus which prevents sperm from entering the uterus, and thins the lining of the inside of the uterus.   Progesterone-only injections. These injections are given every 3 months by your caregiver to prevent pregnancy. This synthetic progesterone hormone stops the ovaries from releasing eggs. It also thickens cervical mucus and changes the uterine lining. This makes it harder for sperm to survive in the uterus.   Birth control pills. These pills contain estrogen and progesterone hormone. They work by stopping the egg from forming in the ovary (ovulation). Birth control pills are prescribed by a caregiver.Birth control pills can also be used to treat heavy periods.   Minipill. This type of birth control pill contains only the progesterone hormone. They are taken every day of each month and must be prescribed by your caregiver.   Birth control patch. The patch contains hormones similar to those in birth control pills. It must be changed once a  week and is prescribed by a caregiver.   Vaginal ring. The ring contains hormones  similar to those in birth control pills. It is left in the vagina for 3 weeks, removed for 1 week, and then a new one is put back in place. The patient must be comfortable inserting and removing the ring from the vagina.A caregiver's prescription is necessary.   Emergency contraception. Emergency contraceptives prevent pregnancy after unprotected sexual intercourse. This pill can be taken right after sex or up to 5 days after unprotected sex. It is most effective the sooner you take the pills after having sexual intercourse. Emergency contraceptive pills are available without a prescription. Check with your pharmacist. Do not use emergency contraception as your only form of birth control.  BARRIER METHODS   Female condom. This is a thin sheath (latex or rubber) that is worn over the penis during sexual intercourse. It can be used with spermicide to increase effectiveness.   Female condom. This is a soft, loose-fitting sheath that is put into the vagina before sexual intercourse.   Diaphragm. This is a soft, latex, dome-shaped barrier that must be fitted by a caregiver. It is inserted into the vagina, along with a spermicidal jelly. It is inserted before intercourse. The diaphragm should be left in the vagina for 6 to 8 hours after intercourse.   Cervical cap. This is a round, soft, latex or plastic cup that fits over the cervix and must be fitted by a caregiver. The cap can be left in place for up to 48 hours after intercourse.   Sponge. This is a soft, circular piece of polyurethane foam. The sponge has spermicide in it. It is inserted into the vagina after wetting it and before sexual intercourse.   Spermicides. These are chemicals that kill or block sperm from entering the cervix and uterus. They come in the form of creams, jellies, suppositories, foam, or tablets. They do not require a prescription. They are  inserted into the vagina with an applicator before having sexual intercourse. The process must be repeated every time you have sexual intercourse.  INTRAUTERINE CONTRACEPTION  Intrauterine device (IUD). This is a T-shaped device that is put in a woman's uterus during a menstrual period to prevent pregnancy. There are 2 types:   Copper IUD. This type of IUD is wrapped in copper wire and is placed inside the uterus. Copper makes the uterus and fallopian tubes produce a fluid that kills sperm. It can stay in place for 10 years.   Hormone IUD. This type of IUD contains the hormone progestin (synthetic progesterone). The hormone thickens the cervical mucus and prevents sperm from entering the uterus, and it also thins the uterine lining to prevent implantation of a fertilized egg. The hormone can weaken or kill the sperm that get into the uterus. It can stay in place for 5 years.  PERMANENT METHODS OF CONTRACEPTION  Female tubal ligation. This is when the woman's fallopian tubes are surgically sealed, tied, or blocked to prevent the egg from traveling to the uterus.   Female sterilization. This is when the female has the tubes that carry sperm tied off (vasectomy).This blocks sperm from entering the vagina during sexual intercourse. After the procedure, the man can still ejaculate fluid (semen).  NATURAL PLANNING METHODS  Natural family planning. This is not having sexual intercourse or using a barrier method (condom, diaphragm, cervical cap) on days the woman could become pregnant.   Calendar method. This is keeping track of the length of each menstrual cycle and identifying when you are fertile.   Ovulation method. This is  avoiding sexual intercourse during ovulation.   Symptothermal method. This is avoiding sexual intercourse during ovulation, using a thermometer and ovulation symptoms.   Post-ovulation method. This is timing sexual intercourse after you have ovulated.  Regardless of which type or  method of contraception you choose, it is important that you use condoms to protect against the transmission of sexually transmitted diseases (STDs). Talk with your caregiver about which form of contraception is most appropriate for you. Document Released: 01/09/2005 Document Revised: 12/29/2010 Document Reviewed: 05/18/2010 [facility] Patient Information 2012 [place], [place].

## 2011-05-18 ENCOUNTER — Ambulatory Visit (HOSPITAL_COMMUNITY)
Admission: RE | Admit: 2011-05-18 | Discharge: 2011-05-18 | Disposition: A | Discharge status: Home / Self Care | Payer: Medicaid Other | Source: Ambulatory Visit | Attending: Obstetrics & Gynecology | Admitting: Obstetrics & Gynecology

## 2011-05-18 DIAGNOSIS — O09299 Supervision of pregnancy with other poor reproductive or obstetric history, unspecified trimester: Secondary | ICD-10-CM | POA: Insufficient documentation

## 2011-05-18 DIAGNOSIS — O24419 Gestational diabetes mellitus in pregnancy, unspecified control: Secondary | ICD-10-CM

## 2011-05-18 DIAGNOSIS — O99810 Abnormal glucose complicating pregnancy: Secondary | ICD-10-CM | POA: Insufficient documentation

## 2011-05-18 IMAGING — US US OB FOLLOW-UP
2 series · 12 of 28 positions shown · non-contrast
Comparison: none

[Series 1: us ob follow up · 11 of 60 slices shown (1 of 2)]
[im 3/60]
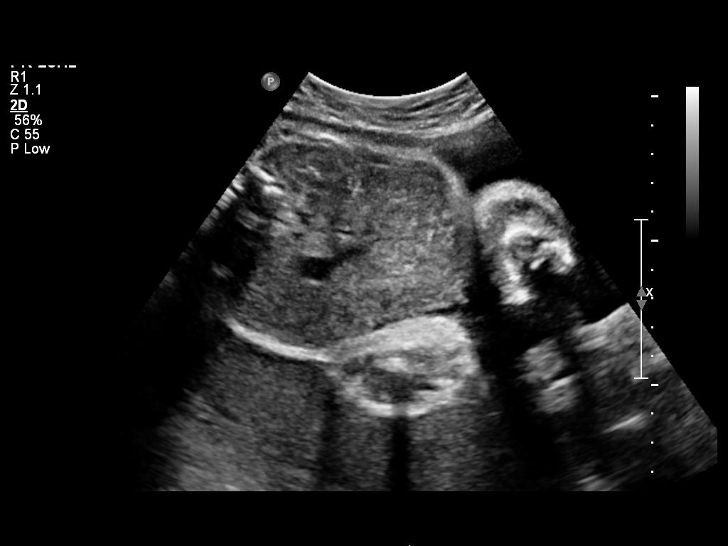
[im 8/60]
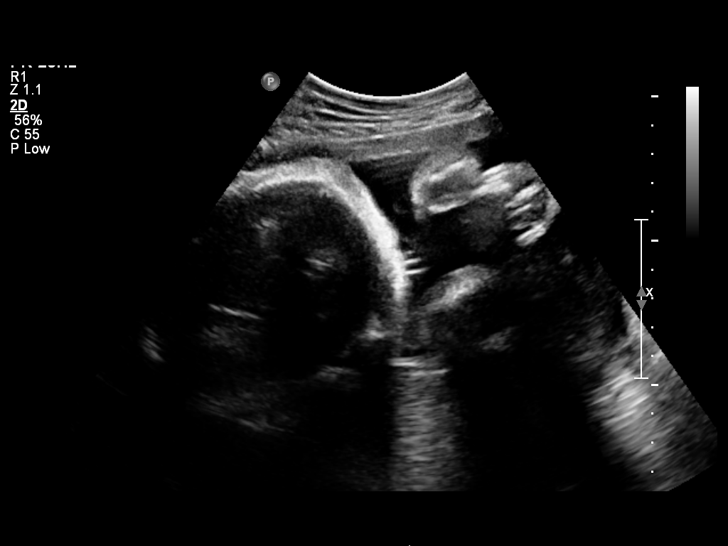
[im 12/60]
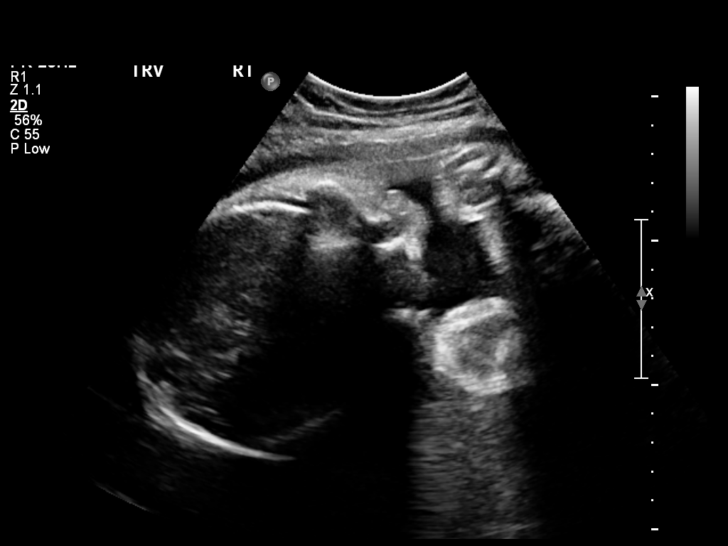
[im 19/60]
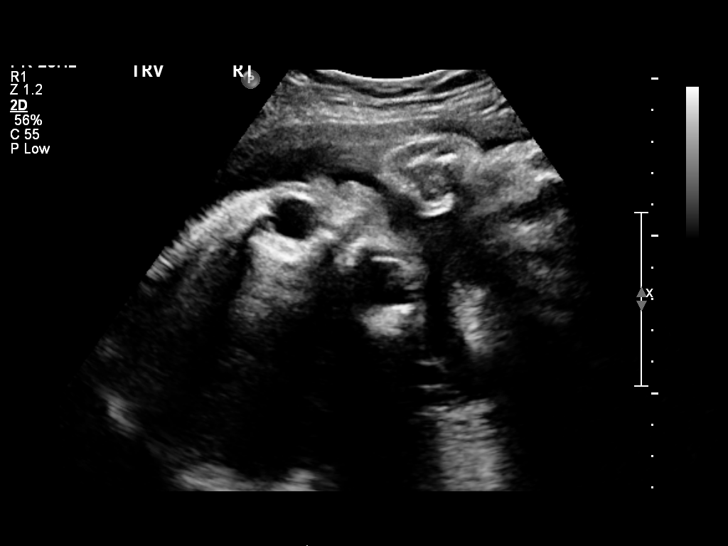
[im 24/60]
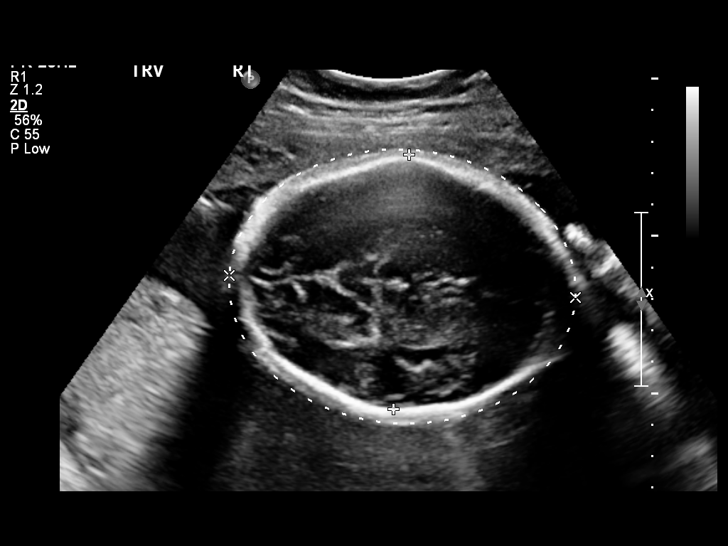
[im 29/60]
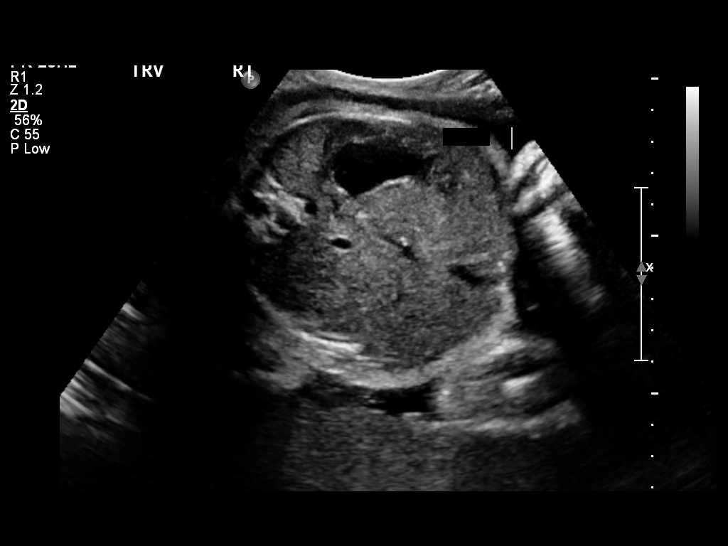
[im 36/60]
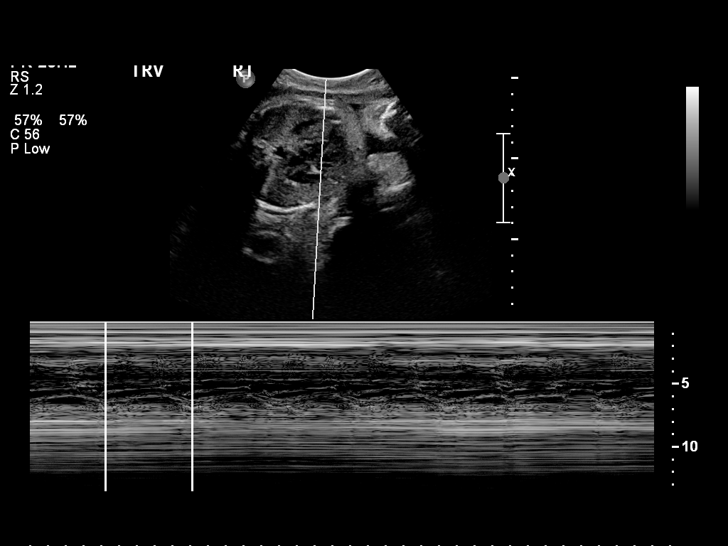
[im 41/60]
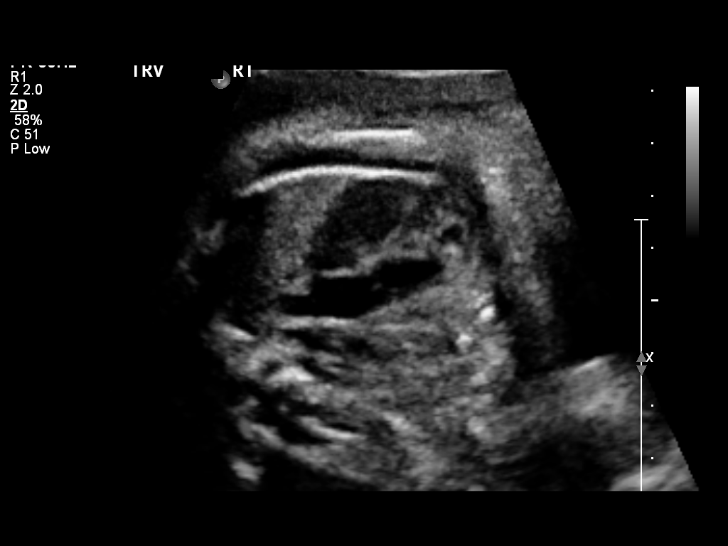
[im 45/60]
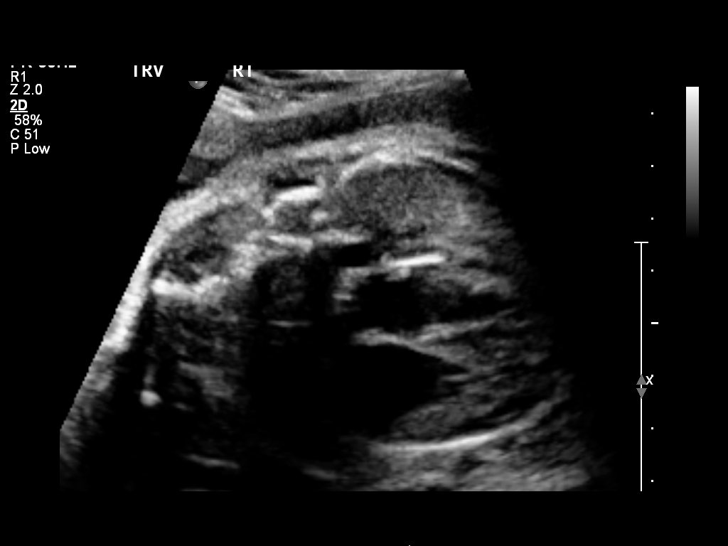
[im 52/60]
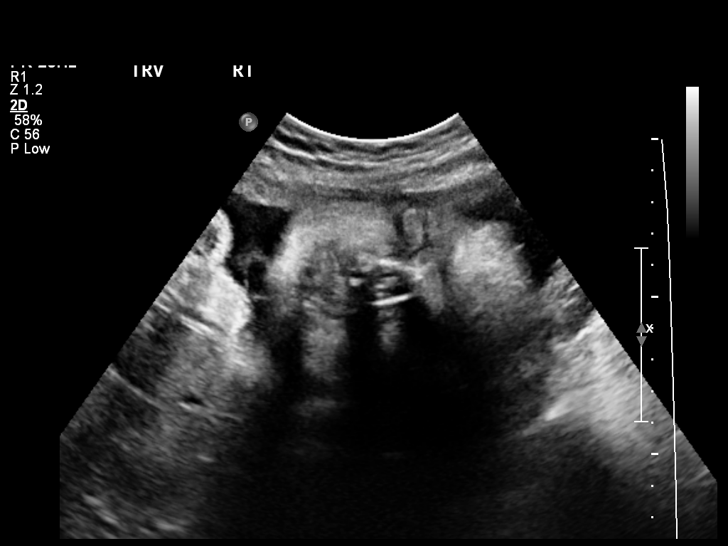
[im 57/60]
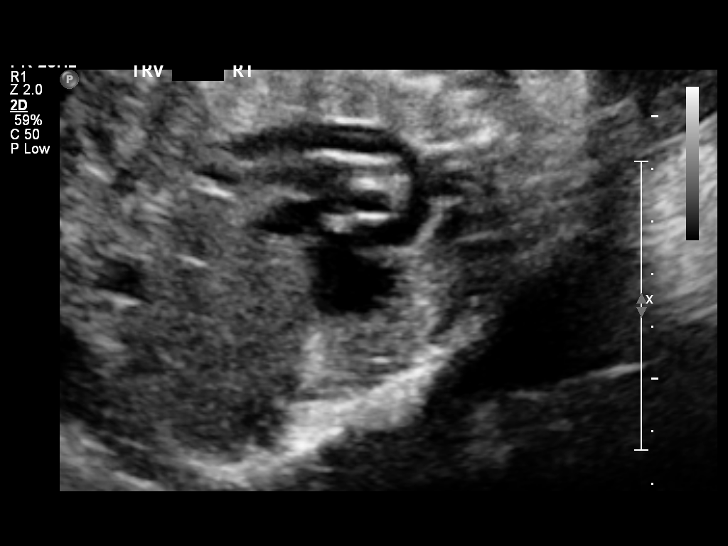

[Series 1: us ob follow up · 1 of 5 slices shown (2 of 2)]
[im 1/5]
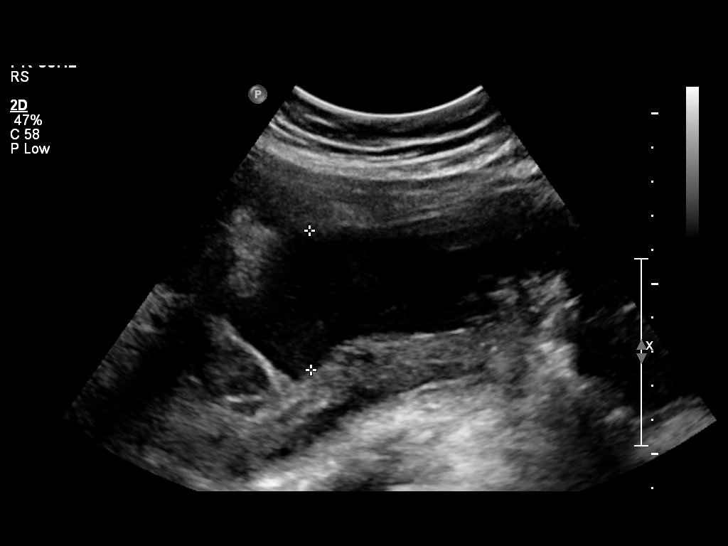

[12 of 28 positions shown; findings below may reference images not displayed]

OBSTETRICS REPORT
                      (Signed Final [DATE] [DATE])

 Order#:         [PHONE_NUMBER]_[id number]
Procedures

 US OB FOLLOW UP                                       [id number]
Indications

 Poor obstetric history: Previous gestational          [DL]
 diabetes
 Diabetes - Gestational, A2
 Assess Fetal Growth / Estimated Fetal Weight
Fetal Evaluation

 Fetal Heart Rate:  123                         bpm
 Cardiac Activity:  Observed
 Presentation:      Variable
 Placenta:          Posterior Fundal, above
                    cervical os
 P. Cord            Previously Visualized
 Insertion:

 Amniotic Fluid
 AFI FV:      Subjectively within normal limits
 AFI Sum:     21.55   cm      83   %Tile     Larg Pckt:   7.56   cm
 RUQ:   7.56   cm    RLQ:    3.81   cm    LUQ:   6.11    cm   LLQ:    4.07   cm
Biometry

 BPD:       80  mm    G. Age:   32w 1d                CI:        69.97   70 - 86
                                                      FL/HC:      20.3   19.1 -

 HC:     305.1  mm    G. Age:   33w 6d       65  %    HC/AC:      1.10   0.96 -

 AC:     278.6  mm    G. Age:   32w 0d       46  %    FL/BPD:     77.3   71 - 87
 FL:      61.8  mm    G. Age:   32w 0d       39  %    FL/AC:      22.2   20 - 24
 CER:     40.9  mm    G. Age:   34w 6d       85  %

 Est. FW:    [DL]  gm      4 lb 4 oz     60  %
Gestational Age

 LMP:           32w 0d       Date:   [DATE]                 EDD:   [DATE]
 U/S Today:     32w 4d                                        EDD:   [DATE]
 Best:          32w 0d    Det. By:   LMP  ([DATE])          EDD:   [DATE]
Anatomy

 Cranium:           Appears normal      Aortic Arch:       Appears normal
 Fetal Cavum:       Appears normal      Ductal Arch:       Appears normal
 Ventricles:        Appears normal      Diaphragm:         Appears normal
 Choroid Plexus:    Previously seen     Stomach:           Appears normal
 Cerebellum:        Appears normal      Abdomen:           Appears normal
 Posterior Fossa:   Appears normal      Abdominal Wall:    Previously seen
 Nuchal Fold:       Previously seen     Cord Vessels:      Appears normal
                                                           (3 vessel cord)
 Face:              Appears normal      Kidneys:           Appear normal
                    (lips/profile/orbit
                    s)
 Heart:             Appears normal      Bladder:           Appears normal
                    (4 chamber &
                    axis)
 RVOT:              Appears normal      Spine:             Previously seen
 LVOT:              Appears normal      Limbs:             Four extremities
                                                           seen

 Other:     Fetus appears to be a female, prev. seen. Heels and 5th
            digit previously seen. Nasal bone prev. visualized.
Cervix Uterus Adnexa

 Cervical Length:   4.3       cm

 Cervix:       Normal appearance by transabdominal scan.

 Left Ovary:   Previously seen.
 Right Ovary:  Previously seen
Impression

 Single intrauterine gestation demonstrating an estimated
 gestational age by ultrasound of 32w 4d. This is correlated
 with expected estimated gestational age by LMP of 32w 0d.
 EFW is currently at the 60%.

 No late developing fetal anatomic abnormalities are noted
 associated with the lateral ventricles, four chamber heart,
 stomach, kidneys or bladder.

 Subjectively and quantitatively normal amniotic fluid volume.

 Normal cervical length and appearance.

## 2011-05-28 ENCOUNTER — Inpatient Hospital Stay (HOSPITAL_COMMUNITY)
Admission: AD | Admit: 2011-05-28 | Discharge: 2011-05-28 | Disposition: A | Discharge status: Home / Self Care | Payer: Medicaid Other | Source: Ambulatory Visit | Attending: Family Medicine | Admitting: Family Medicine

## 2011-05-28 ENCOUNTER — Encounter (HOSPITAL_COMMUNITY): Payer: Self-pay

## 2011-05-28 DIAGNOSIS — J45909 Unspecified asthma, uncomplicated: Secondary | ICD-10-CM | POA: Insufficient documentation

## 2011-05-28 DIAGNOSIS — R030 Elevated blood-pressure reading, without diagnosis of hypertension: Secondary | ICD-10-CM

## 2011-05-28 DIAGNOSIS — O99891 Other specified diseases and conditions complicating pregnancy: Secondary | ICD-10-CM | POA: Insufficient documentation

## 2011-05-28 DIAGNOSIS — O24919 Unspecified diabetes mellitus in pregnancy, unspecified trimester: Secondary | ICD-10-CM

## 2011-05-28 DIAGNOSIS — J45901 Unspecified asthma with (acute) exacerbation: Secondary | ICD-10-CM

## 2011-05-28 LAB — URINALYSIS, ROUTINE W REFLEX MICROSCOPIC
Bilirubin Urine: NEGATIVE
Ketones, ur: 15 mg/dL — AB
Specific Gravity, Urine: <1.005 — ABNORMAL LOW (ref 1.005–1.030)
Urobilinogen, UA: 0.2 mg/dL (ref 0.0–1.0)

## 2011-05-28 LAB — URINE MICROSCOPIC-ADD ON

## 2011-05-28 MED ORDER — ALBUTEROL SULFATE HFA 108 (90 BASE) MCG/ACT IN AERS: 2.0000 | INHALATION_SPRAY | Freq: Four times a day (QID) | RESPIRATORY_TRACT | Status: DC | PRN

## 2011-05-28 MED ORDER — IPRATROPIUM BROMIDE 0.02 % IN SOLN
0.5000 mg | Freq: Once | RESPIRATORY_TRACT | Status: AC
  Administered 2011-05-28: 0.5 mg via RESPIRATORY_TRACT
  Filled 2011-05-28: qty 2.5

## 2011-05-28 MED ORDER — ALBUTEROL SULFATE (5 MG/ML) 0.5% IN NEBU
2.5000 mg | INHALATION_SOLUTION | Freq: Once | RESPIRATORY_TRACT | Status: AC
  Administered 2011-05-28: 2.5 mg via RESPIRATORY_TRACT
  Filled 2011-05-28: qty 0.5

## 2011-05-28 MED ORDER — ACETAMINOPHEN 325 MG PO TABS
650.0000 mg | ORAL_TABLET | Freq: Once | ORAL | Status: AC
  Administered 2011-05-28: 650 mg via ORAL
  Filled 2011-05-28: qty 2

## 2011-05-28 MED ORDER — LACTATED RINGERS IV SOLN
Freq: Once | INTRAVENOUS | Status: AC
  Administered 2011-05-28: 07:00:00 via INTRAVENOUS

## 2011-05-28 NOTE — MAU Provider Note (Signed)
  History     CSN: [id number]  Arrival date and time: 05/28/11 4098   None     No chief complaint on file.  HPI  Pt reports here for wheezing.  Hx of asthma.  Denies fever, body aches, and chills.  +vomiting.    Past Medical History  Diagnosis Date  . Asthma   . Gestational diabetes     GDM  . Unspecified high-risk pregnancy 01/27/2011  . Sickle cell trait     Past Surgical History  Procedure Date  . Tonsillectomy     Family History  Problem Relation Age of Onset  . Sickle cell trait Mother   . Depression Mother   . Asthma Father   . Drug abuse Father     heroin abuse  . Kidney disease Father     due to heroin abuse  . Diabetes Maternal Grandmother     History  Substance Use Topics  . Smoking status: Never Smoker   . Smokeless tobacco: Never Used  . Alcohol Use: No    Allergies: No Known Allergies  Prescriptions prior to admission  Medication Sig Dispense Refill  . ACCU-CHEK FASTCLIX LANCETS MISC 1 Units by Percutaneous route 4 (four) times daily.  100 each  12  . acetaminophen (TYLENOL) 325 MG tablet Take 325 mg by mouth every 6 (six) hours as needed. Head aches      . Blood Glucose Monitoring Suppl (ACCU-CHEK NANO SMARTVIEW) W/DEVICE KIT 1 Units by Does not apply route as directed. Check fasting, and two hours after breakfast, lunch and dinner  1 kit  0  . calcium carbonate (TUMS - DOSED IN MG ELEMENTAL CALCIUM) 500 MG chewable tablet Chew 1 tablet by mouth 2 (two) times daily as needed.       Marland Kitchen glucose blood test strip 1 each 4 (four) times daily. Use as instructed to check blood sugars      . Prenatal Vit-Fe Fumarate-FA (PRENATAL MULTIVITAMIN) TABS Take 1 tablet by mouth daily.  30 tablet  6    Review of Systems  Unable to perform ROS Respiratory: Positive for sputum production and wheezing.   Gastrointestinal: Positive for heartburn, nausea and vomiting. Negative for abdominal pain.   Physical Exam   Height 5' (1.524 m), weight 80.377 kg (177 lb 3.2  oz), last menstrual period 10/06/2010, SpO2 98.00%, unknown if currently breastfeeding. There were no vitals filed for this visit.  Physical Exam  Vitals reviewed. Constitutional: She is oriented to person, place, and time. She appears well-developed and well-nourished. No distress.       +spitting into a bag; no active vomiting  HENT:  Head: Normocephalic.  Neck: Normal range of motion. Neck supple.  Cardiovascular: Normal rate, regular rhythm and normal heart sounds.   Respiratory: Effort normal. She has wheezes.  GI: Soft. There is no tenderness.  Genitourinary: No bleeding around the vagina.  Musculoskeletal: Normal range of motion. She exhibits no edema.  Neurological: She is alert and oriented to person, place, and time.  Skin: Skin is warm and dry.    MAU Course  Procedures  IV LR 10000 Breathing TX > breathing improved after treatment, decreased wheezing>pt states feels better  Assessment and Plan  Asthma  Plan: DC to home Continue medications Keep appointment in clinic this week  [facility] 05/28/2011, 6:32 AM

## 2011-05-28 NOTE — MAU Provider Note (Signed)
Chart reviewed and agree with management and plan.  

## 2011-05-28 NOTE — MAU Note (Signed)
Pt states sore throat since April and wheezing since April 24th, worse tonight. Non productive cough. Tonight tried to "sweat out" her sickness and started vomiting. States has vomited too many times to count since Friday evening. Denies vaginal bleeding. Reports positive fetal movement. Denies contractions.

## 2011-05-28 NOTE — Discharge Instructions (Signed)
Asthma, Acute Bronchospasm  Your exam shows you have asthma, or acute bronchospasm that acts like asthma. Bronchospasm means your air passages become narrowed. These conditions are due to inflammation and airway spasm that cause narrowing of the bronchial tubes in the lungs. This causes you to have wheezing and shortness of breath.  CAUSES    Respiratory infections and allergies most often bring on these attacks. Smoking, air pollution, cold air, emotional upsets, and vigorous exercise can also bring them on.    TREATMENT     Treatment is aimed at making the narrowed airways larger. Mild asthma/bronchospasm is usually controlled with inhaled medicines. Albuterol is a common medicine that you breathe in to open spastic or narrowed airways. Some trade names for albuterol are Ventolin or Proventil. Steroid medicine is also used to reduce the inflammation when an attack is moderate or severe. Antibiotics (medications used to kill germs) are only used if a bacterial infection is present.    If you are pregnant and need to use Albuterol (Ventolin or Proventil), you can expect the baby to move more than usual shortly after the medicine is used.   HOME CARE INSTRUCTIONS     Rest.    Drink plenty of liquids. This helps the mucus to remain thin and easily coughed up. Do not use caffeine or alcohol.    Do not smoke. Avoid being exposed to second-hand smoke.    You play a critical role in keeping yourself in good health. Avoid exposure to things that cause you to wheeze. Avoid exposure to things that cause you to have breathing problems. Keep your medications up-to-date and available. Carefully follow your doctor's treatment plan.    When pollen or pollution is bad, keep windows closed and use an air conditioner go to places with air conditioning. If you are allergic to furry pets or birds, find new homes for them or keep them outside.    Take your medicine exactly as prescribed.     Asthma requires careful medical attention. See your caregiver for follow-up as advised. If you are more than [redacted] weeks pregnant and you were prescribed any new medications, let your Obstetrician know about the visit and how you are doing. Arrange a recheck.   SEEK IMMEDIATE MEDICAL CARE IF:     You are getting worse.    You have trouble breathing. If severe, call 911.    You develop chest pain or discomfort.    You are throwing up or not drinking fluids.    You are not getting better within 24 hours.    You are coughing up yellow, green, brown, or bloody sputum.    You develop a fever over 102 F (38.9 C).    You have trouble swallowing.   MAKE SURE YOU:     Understand these instructions.    Will watch your condition.    Will get help right away if you are not doing well or get worse.   Document Released: 04/26/2006 Document Revised: 12/29/2010 Document Reviewed: 12/24/2006  [employer] Patient Information 2012 [employer].

## 2011-05-29 ENCOUNTER — Ambulatory Visit (INDEPENDENT_AMBULATORY_CARE_PROVIDER_SITE_OTHER): Payer: Medicaid Other | Admitting: Obstetrics and Gynecology

## 2011-05-29 VITALS — BP 113/79 | Temp 97.4°F | Wt 172.1 lb

## 2011-05-29 DIAGNOSIS — O24919 Unspecified diabetes mellitus in pregnancy, unspecified trimester: Secondary | ICD-10-CM

## 2011-05-29 DIAGNOSIS — R030 Elevated blood-pressure reading, without diagnosis of hypertension: Secondary | ICD-10-CM

## 2011-05-29 DIAGNOSIS — O09899 Supervision of other high risk pregnancies, unspecified trimester: Secondary | ICD-10-CM

## 2011-05-29 DIAGNOSIS — O0990 Supervision of high risk pregnancy, unspecified, unspecified trimester: Secondary | ICD-10-CM

## 2011-05-29 LAB — POCT URINALYSIS DIP (DEVICE)
Protein, ur: 30 mg/dL — AB
Specific Gravity, Urine: 1.025 (ref 1.005–1.030)

## 2011-05-29 LAB — GLUCOSE, CAPILLARY: Glucose-Capillary: 88 mg/dL (ref 70–99)

## 2011-05-29 MED ORDER — PROMETHAZINE HCL 12.5 MG PO TABS: 12.5000 mg | ORAL_TABLET | Freq: Four times a day (QID) | ORAL | Status: DC | PRN

## 2011-05-29 MED ORDER — ONDANSETRON 4 MG PO TBDP: 4.0000 mg | ORAL_TABLET | Freq: Four times a day (QID) | ORAL | Status: AC | PRN

## 2011-05-29 MED ORDER — FAMOTIDINE 20 MG PO TABS: 20.0000 mg | ORAL_TABLET | Freq: Two times a day (BID) | ORAL | Status: DC

## 2011-05-29 NOTE — Progress Notes (Signed)
No vaginal discharge. Pulse 123.

## 2011-05-29 NOTE — Progress Notes (Signed)
Patient doing well without complaints. Did not bring log book or meter but reports CBGs well controlled. CBG in clinic following breakfast 88. Pepcid for acid reflux. FM/PTL precautions reviewed. Patient advised to bring CBGs at next visit for MD review

## 2011-06-12 ENCOUNTER — Encounter: Payer: Self-pay | Admitting: Family Medicine

## 2011-06-12 ENCOUNTER — Ambulatory Visit (INDEPENDENT_AMBULATORY_CARE_PROVIDER_SITE_OTHER): Payer: Medicaid Other | Admitting: Family Medicine

## 2011-06-12 VITALS — BP 129/77 | Temp 97.4°F | Wt 173.4 lb

## 2011-06-12 DIAGNOSIS — O0990 Supervision of high risk pregnancy, unspecified, unspecified trimester: Secondary | ICD-10-CM

## 2011-06-12 DIAGNOSIS — O24919 Unspecified diabetes mellitus in pregnancy, unspecified trimester: Secondary | ICD-10-CM

## 2011-06-12 DIAGNOSIS — O219 Vomiting of pregnancy, unspecified: Secondary | ICD-10-CM

## 2011-06-12 DIAGNOSIS — R030 Elevated blood-pressure reading, without diagnosis of hypertension: Secondary | ICD-10-CM

## 2011-06-12 NOTE — Progress Notes (Signed)
Pulse: 130

## 2011-06-12 NOTE — Progress Notes (Signed)
Did not bring book or meter.  Reports fasting 75-85 and 2hr PP 100 to 115.  No complaints.

## 2011-06-12 NOTE — Patient Instructions (Signed)
Pregnancy - Third Trimester The third trimester of pregnancy (the last 3 months) is a period of the most rapid growth for you and your baby. The baby approaches a length of 20 inches and a weight of 6 to 10 pounds. The baby is adding on fat and getting ready for life outside your body. While inside, babies have periods of sleeping and waking, suck their thumbs, and hiccups. You can often feel small contractions of the uterus. This is false labor. It is also called Braxton-Hicks contractions. This is like a practice for labor. The usual problems in this stage of pregnancy include more difficulty breathing, swelling of the hands and feet from water retention, and having to urinate more often because of the uterus and baby pressing on your bladder.  PRENATAL EXAMS  Blood work may continue to be done during prenatal exams. These tests are done to check on your health and the probable health of your baby. Blood work is used to follow your blood levels (hemoglobin). Anemia (low hemoglobin) is common during pregnancy. Iron and vitamins are given to help prevent this. You may also continue to be checked for diabetes. Some of the past blood tests may be done again.   The size of the uterus is measured during each visit. This makes sure your baby is growing properly according to your pregnancy dates.   Your blood pressure is checked every prenatal visit. This is to make sure you are not getting toxemia.   Your urine is checked every prenatal visit for infection, diabetes and protein.   Your weight is checked at each visit. This is done to make sure gains are happening at the suggested rate and that you and your baby are growing normally.   Sometimes, an ultrasound is performed to confirm the position and the proper growth and development of the baby. This is a test done that bounces harmless sound waves off the baby so your caregiver can more accurately determine due dates.   Discuss the type of pain  medication and anesthesia you will have during your labor and delivery.   Discuss the possibility and anesthesia if a Cesarean Section might be necessary.   Inform your caregiver if there is any mental or physical violence at home.  Sometimes, a specialized non-stress test, contraction stress test and biophysical profile are done to make sure the baby is not having a problem. Checking the amniotic fluid surrounding the baby is called an amniocentesis. The amniotic fluid is removed by sticking a needle into the belly (abdomen). This is sometimes done near the end of pregnancy if an early delivery is required. In this case, it is done to help make sure the baby's lungs are mature enough for the baby to live outside of the womb. If the lungs are not mature and it is unsafe to deliver the baby, an injection of cortisone medication is given to the mother 1 to 2 days before the delivery. This helps the baby's lungs mature and makes it safer to deliver the baby. CHANGES OCCURING IN THE THIRD TRIMESTER OF PREGNANCY Your body goes through many changes during pregnancy. They vary from person to person. Talk to your caregiver about changes you notice and are concerned about.  During the last trimester, you have probably had an increase in your appetite. It is normal to have cravings for certain foods. This varies from person to person and pregnancy to pregnancy.   You may begin to get stretch marks on your hips,   abdomen, and breasts. These are normal changes in the body during pregnancy. There are no exercises or medications to take which prevent this change.   Constipation may be treated with a stool softener or adding bulk to your diet. Drinking lots of fluids, fiber in vegetables, fruits, and whole grains are helpful.   Exercising is also helpful. If you have been very active up until your pregnancy, most of these activities can be continued during your pregnancy. If you have been less active, it is helpful  to start an exercise program such as walking. Consult your caregiver before starting exercise programs.   Avoid all smoking, alcohol, un-prescribed drugs, herbs and "street drugs" during your pregnancy. These chemicals affect the formation and growth of the baby. Avoid chemicals throughout the pregnancy to ensure the delivery of a healthy infant.   Backache, varicose veins and hemorrhoids may develop or get worse.   You will tire more easily in the third trimester, which is normal.   The baby's movements may be stronger and more often.   You may become short of breath easily.   Your belly button may stick out.   A yellow discharge may leak from your breasts called colostrum.   You may have a bloody mucus discharge. This usually occurs a few days to a week before labor begins.  HOME CARE INSTRUCTIONS   Keep your caregiver's appointments. Follow your caregiver's instructions regarding medication use, exercise, and diet.   During pregnancy, you are providing food for you and your baby. Continue to eat regular, well-balanced meals. Choose foods such as meat, fish, milk and other low fat dairy products, vegetables, fruits, and whole-grain breads and cereals. Your caregiver will tell you of the ideal weight gain.   A physical sexual relationship may be continued throughout pregnancy if there are no other problems such as early (premature) leaking of amniotic fluid from the membranes, vaginal bleeding, or belly (abdominal) pain.   Exercise regularly if there are no restrictions. Check with your caregiver if you are unsure of the safety of your exercises. Greater weight gain will occur in the last 2 trimesters of pregnancy. Exercising helps:   Control your weight.   Get you in shape for labor and delivery.   You lose weight after you deliver.   Rest a lot with legs elevated, or as needed for leg cramps or low back pain.   Wear a good support or jogging bra for breast tenderness during  pregnancy. This may help if worn during sleep. Pads or tissues may be used in the bra if you are leaking colostrum.   Do not use hot tubs, steam rooms, or saunas.   Wear your seat belt when driving. This protects you and your baby if you are in an accident.   Avoid raw meat, cat litter boxes and soil used by cats. These carry germs that can cause birth defects in the baby.   It is easier to loose urine during pregnancy. Tightening up and strengthening the pelvic muscles will help with this problem. You can practice stopping your urination while you are going to the bathroom. These are the same muscles you need to strengthen. It is also the muscles you would use if you were trying to stop from passing gas. You can practice tightening these muscles up 10 times a set and repeating this about 3 times per day. Once you know what muscles to tighten up, do not perform these exercises during urination. It is more likely   to cause an infection by backing up the urine.   Ask for help if you have financial, counseling or nutritional needs during pregnancy. Your caregiver will be able to offer counseling for these needs as well as refer you for other special needs.   Make a list of emergency phone numbers and have them available.   Plan on getting help from family or friends when you go home from the hospital.   Make a trial run to the hospital.   Take prenatal classes with the father to understand, practice and ask questions about the labor and delivery.   Prepare the baby's room/nursery.   Do not travel out of the city unless it is absolutely necessary and with the advice of your caregiver.   Wear only low or no heal shoes to have better balance and prevent falling.  MEDICATIONS AND DRUG USE IN PREGNANCY  Take prenatal vitamins as directed. The vitamin should contain 1 milligram of folic acid. Keep all vitamins out of reach of children. Only a couple vitamins or tablets containing iron may be fatal  to a baby or young child when ingested.   Avoid use of all medications, including herbs, over-the-counter medications, not prescribed or suggested by your caregiver. Only take over-the-counter or prescription medicines for pain, discomfort, or fever as directed by your caregiver. Do not use aspirin, ibuprofen (Motrin, Advil, Nuprin) or naproxen (Aleve) unless OK'd by your caregiver.   Let your caregiver also know about herbs you may be using.   Alcohol is related to a number of birth defects. This includes fetal alcohol syndrome. All alcohol, in any form, should be avoided completely. Smoking will cause low birth rate and premature babies.   Street/illegal drugs are very harmful to the baby. They are absolutely forbidden. A baby born to an addicted mother will be addicted at birth. The baby will go through the same withdrawal an adult does.  SEEK MEDICAL CARE IF: You have any concerns or worries during your pregnancy. It is better to call with your questions if you feel they cannot wait, rather than worry about them. DECISIONS ABOUT CIRCUMCISION You may or may not know the sex of your baby. If you know your baby is a boy, it may be time to think about circumcision. Circumcision is the removal of the foreskin of the penis. This is the skin that covers the sensitive end of the penis. There is no proven medical need for this. Often this decision is made on what is popular at the time or based upon religious beliefs and social issues. You can discuss these issues with your caregiver or pediatrician. SEEK IMMEDIATE MEDICAL CARE IF:   An unexplained oral temperature above 102 F (38.9 C) develops, or as your caregiver suggests.   You have leaking of fluid from the vagina (birth canal). If leaking membranes are suspected, take your temperature and tell your caregiver of this when you call.   There is vaginal spotting, bleeding or passing clots. Tell your caregiver of the amount and how many pads are  used.   You develop a bad smelling vaginal discharge with a change in the color from clear to white.   You develop vomiting that lasts more than 24 hours.   You develop chills or fever.   You develop shortness of breath.   You develop burning on urination.   You loose more than 2 pounds of weight or gain more than 2 pounds of weight or as suggested by your   caregiver.   You notice sudden swelling of your face, hands, and feet or legs.   You develop belly (abdominal) pain. Round ligament discomfort is a common non-cancerous (benign) cause of abdominal pain in pregnancy. Your caregiver still must evaluate you.   You develop a severe headache that does not go away.   You develop visual problems, blurred or double vision.   If you have not felt your baby move for more than 1 hour. If you think the baby is not moving as much as usual, eat something with sugar in it and lie down on your left side for an hour. The baby should move at least 4 to 5 times per hour. Call right away if your baby moves less than that.   You fall, are in a car accident or any kind of trauma.   There is mental or physical violence at home.  Document Released: 01/03/2001 Document Revised: 12/29/2010 Document Reviewed: 07/08/2008 [employer] Patient Information 2012 [employer]. 

## 2011-06-13 LAB — GC/CHLAMYDIA PROBE AMP, GENITAL: Chlamydia, DNA Probe: NEGATIVE

## 2011-06-15 LAB — CULTURE, BETA STREP (GROUP B ONLY)

## 2011-06-26 ENCOUNTER — Ambulatory Visit (INDEPENDENT_AMBULATORY_CARE_PROVIDER_SITE_OTHER): Payer: Medicaid Other | Admitting: Family Medicine

## 2011-06-26 VITALS — BP 103/67 | Temp 98.1°F | Wt 174.9 lb

## 2011-06-26 DIAGNOSIS — O24919 Unspecified diabetes mellitus in pregnancy, unspecified trimester: Secondary | ICD-10-CM

## 2011-06-26 LAB — POCT URINALYSIS DIP (DEVICE)
Glucose, UA: NEGATIVE mg/dL
Nitrite: NEGATIVE
Specific Gravity, Urine: 1.02 (ref 1.005–1.030)
Urobilinogen, UA: 0.2 mg/dL (ref 0.0–1.0)

## 2011-06-26 MED ORDER — PRENATAL MULTIVITAMIN CH: 1.0000 | ORAL_TABLET | Freq: Every day | ORAL | Status: DC

## 2011-06-26 MED ORDER — GLUCOSE BLOOD VI STRP: ORAL_STRIP | Status: DC

## 2011-06-26 NOTE — Progress Notes (Signed)
No book and no meter--states she never brings Reports fasting 77-86, reports 2 hour pp 98-110 Wants CBG checked today No u/s for growth since 4/13. Will schedule U/S this week Schedule IOL at 39 wks

## 2011-06-26 NOTE — Progress Notes (Signed)
Pulse- 101 

## 2011-06-26 NOTE — Patient Instructions (Signed)

## 2011-06-26 NOTE — Progress Notes (Signed)
U/S scheduled June 30, 2011 at 1015 am.

## 2011-06-30 ENCOUNTER — Ambulatory Visit (HOSPITAL_COMMUNITY): Payer: Medicaid Other

## 2011-06-30 ENCOUNTER — Ambulatory Visit (HOSPITAL_COMMUNITY)
Admission: RE | Admit: 2011-06-30 | Discharge: 2011-06-30 | Disposition: A | Discharge status: Home / Self Care | Payer: Medicaid Other | Source: Ambulatory Visit | Attending: Family Medicine | Admitting: Family Medicine

## 2011-06-30 DIAGNOSIS — O09299 Supervision of pregnancy with other poor reproductive or obstetric history, unspecified trimester: Secondary | ICD-10-CM | POA: Insufficient documentation

## 2011-06-30 DIAGNOSIS — O24919 Unspecified diabetes mellitus in pregnancy, unspecified trimester: Secondary | ICD-10-CM

## 2011-06-30 DIAGNOSIS — O99810 Abnormal glucose complicating pregnancy: Secondary | ICD-10-CM | POA: Insufficient documentation

## 2011-06-30 DIAGNOSIS — Z3689 Encounter for other specified antenatal screening: Secondary | ICD-10-CM | POA: Insufficient documentation

## 2011-06-30 IMAGING — US US OB FOLLOW-UP
1 series · 12 of 28 positions shown · non-contrast
Comparison: none

[Series 1: us ob follow up · 12 of 35 slices shown]
[im 2/35]
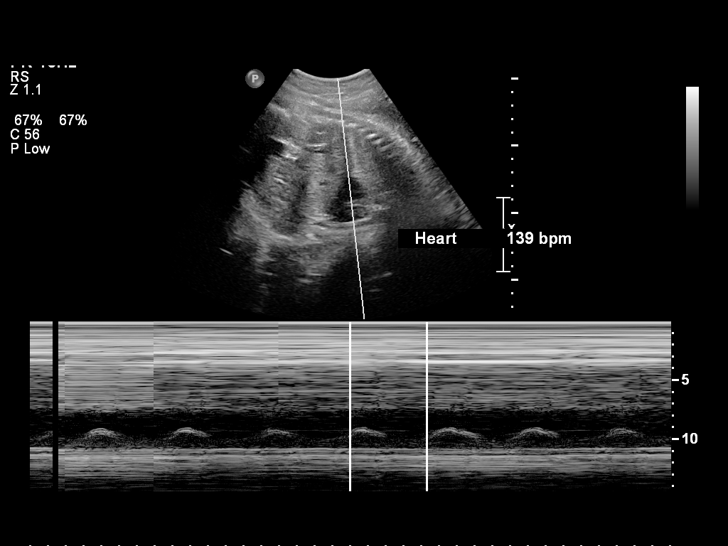
[im 4/35]
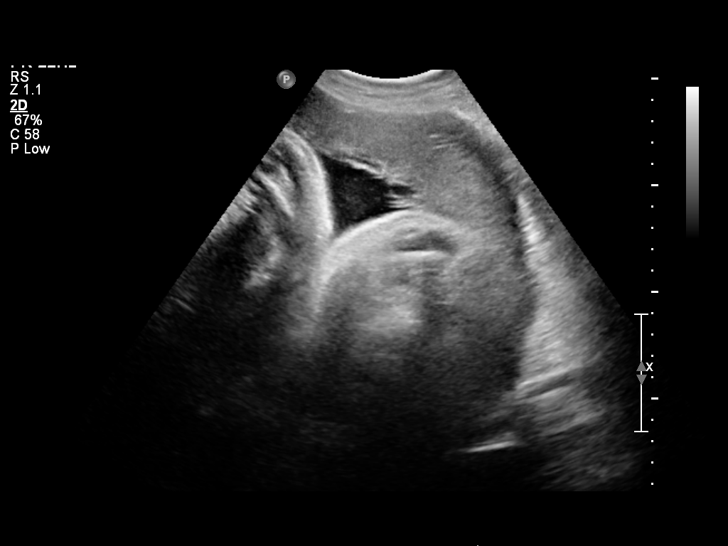
[im 7/35]
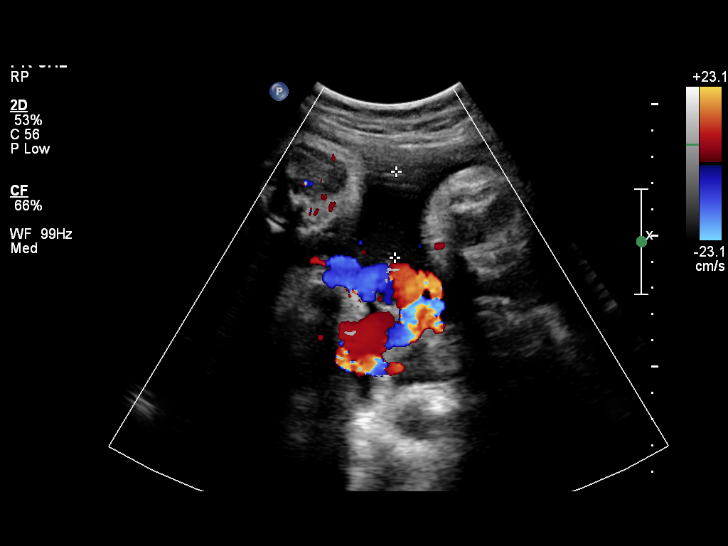
[im 11/35]
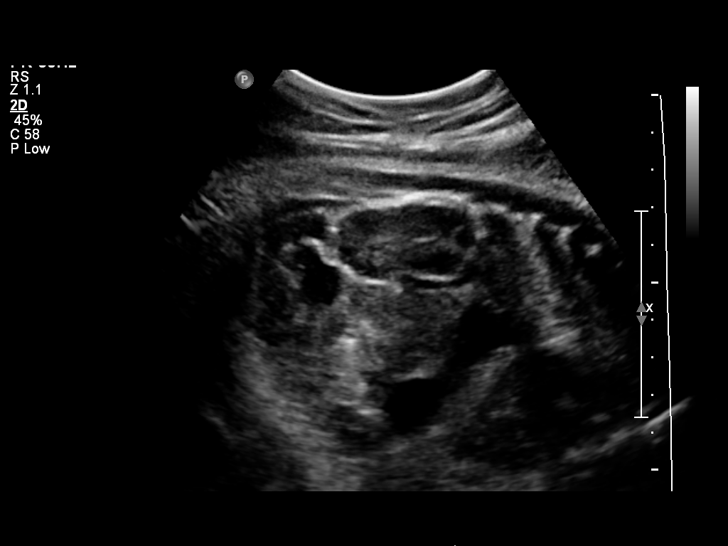
[im 13/35]
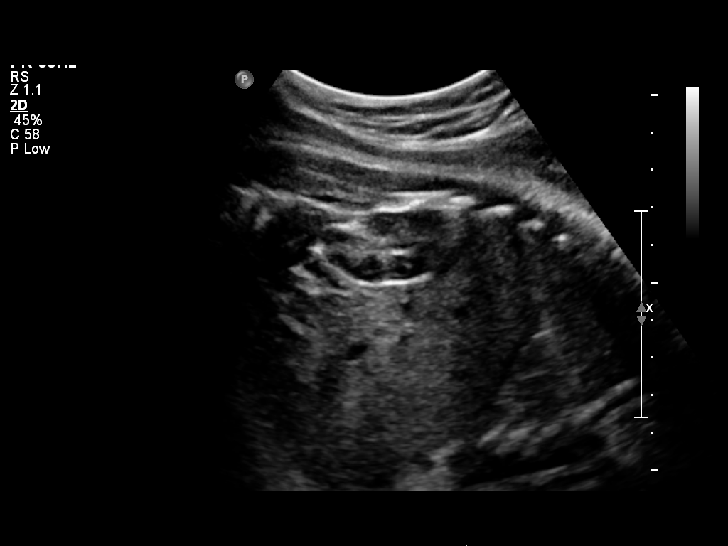
[im 16/35]
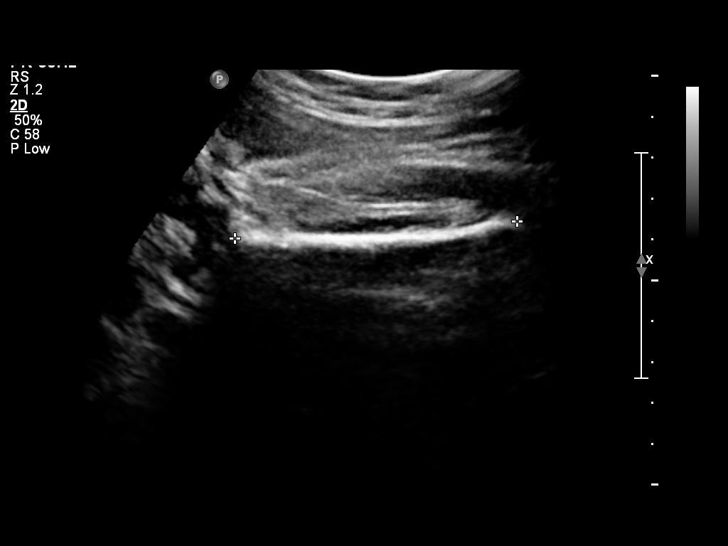
[im 19/35]
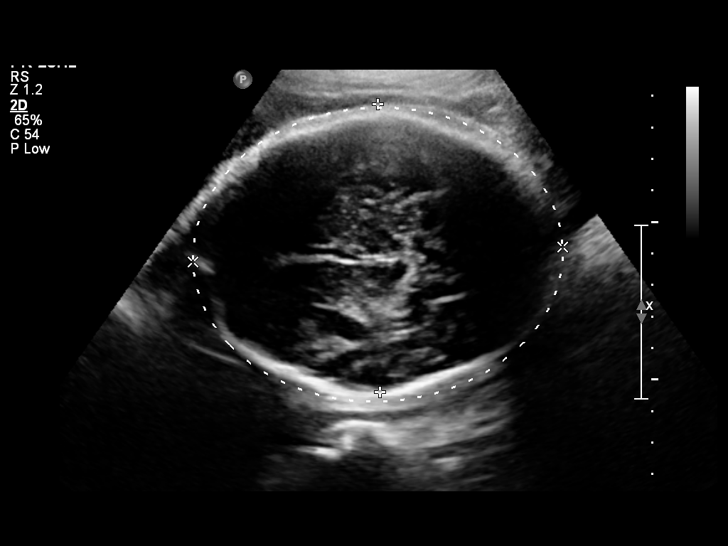
[im 22/35]
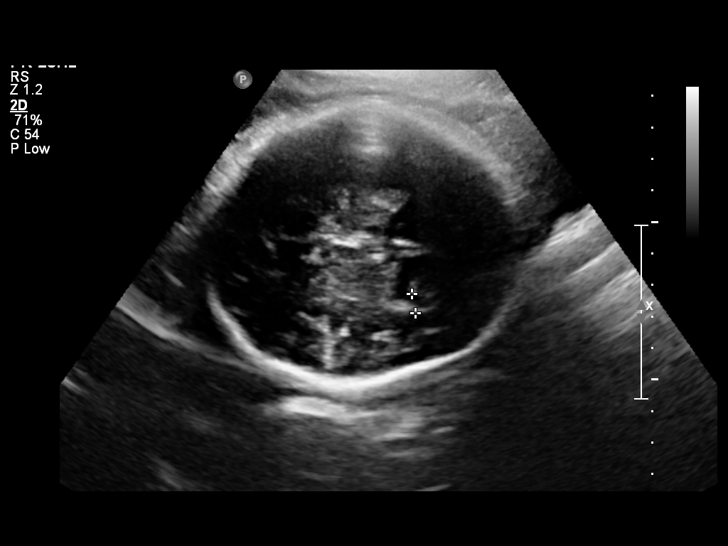
[im 24/35]
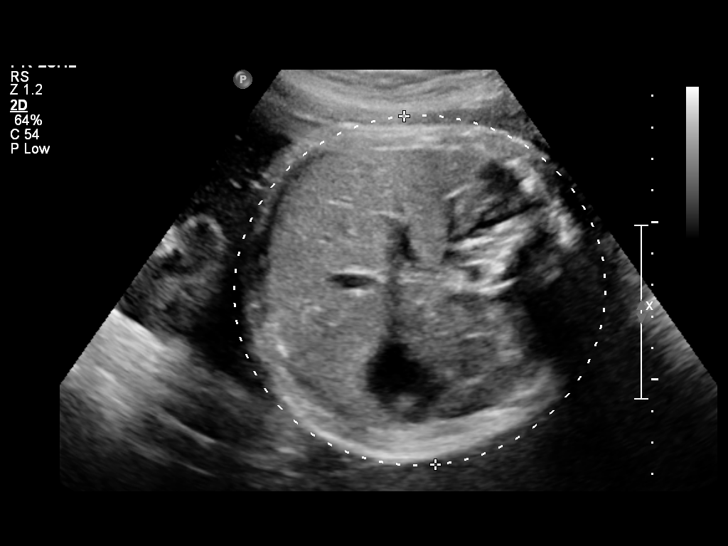
[im 28/35]
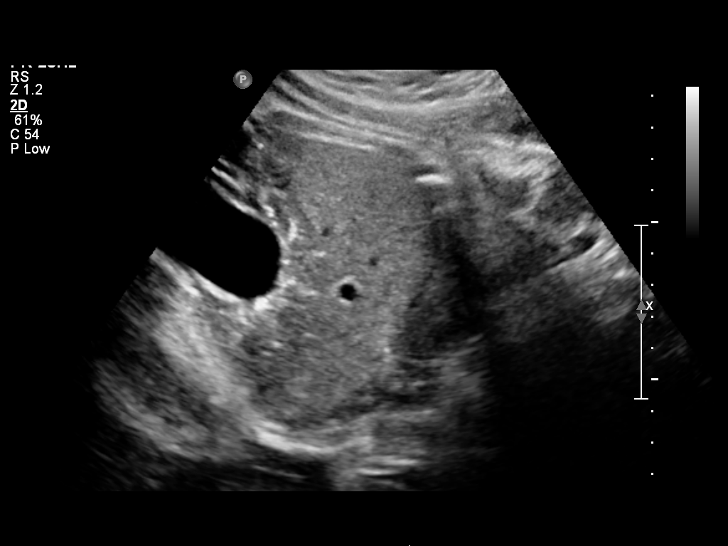
[im 31/35]
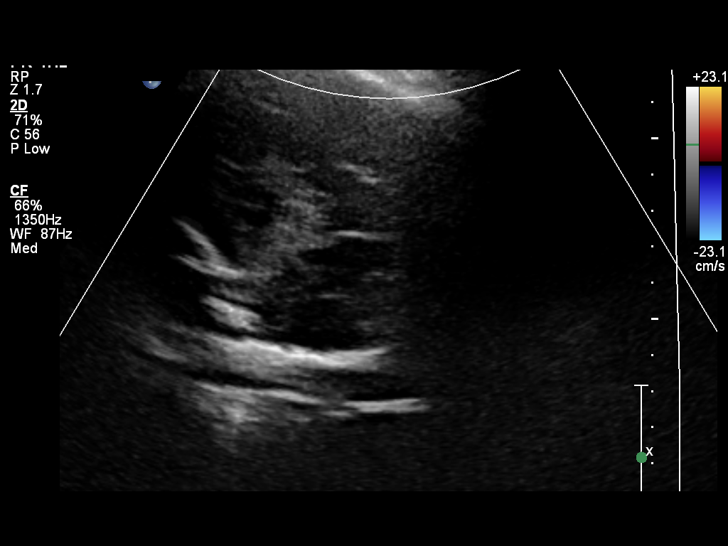
[im 33/35]
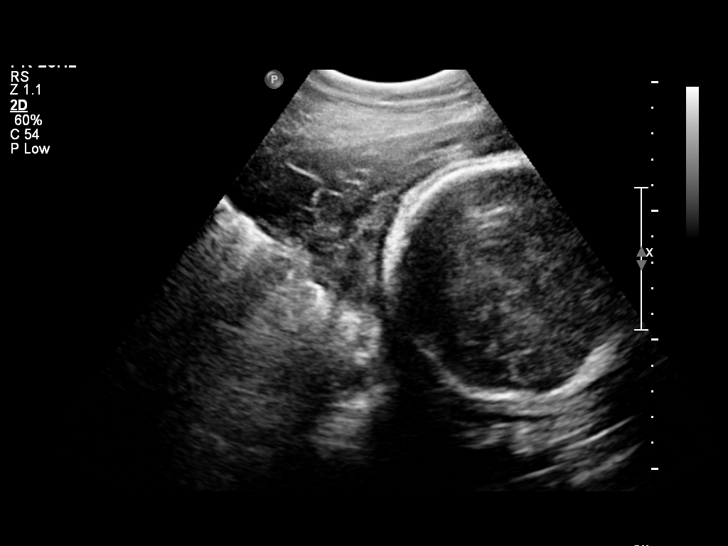

[12 of 28 positions shown; findings below may reference images not displayed]

OBSTETRICS REPORT
                      (Signed Final [DATE] [DATE])

 Order#:         [PHONE_NUMBER]_O
Procedures

 US OB FOLLOW UP                                       [id number]
Indications

 Poor obstetric history: Previous gestational          [3A]
 diabetes
 Diabetes - Gestational, A2
 Assess Fetal Growth / Estimated Fetal Weight
Fetal Evaluation

 Fetal Heart Rate:  139                         bpm
 Cardiac Activity:  Observed
 Presentation:      Cephalic
 Placenta:          Posterior Fundal, above
                    cervical os
 P. Cord            Previously Visualized
 Insertion:

 Amniotic Fluid
 AFI FV:      Subjectively within normal limits
 AFI Sum:     16.79   cm      66   %Tile     Larg Pckt:   5.45   cm
 RUQ:   3.27   cm    RLQ:    5.32   cm    LUQ:   5.45    cm   LLQ:    2.75   cm
Biometry

 BPD:     90.2  mm    G. Age:   36w 4d                CI:        75.74   70 - 86
                                                      FL/HC:      20.9   20.9 -

 HC:     328.6  mm    G. Age:   37w 3d       17  %    HC/AC:      0.92   0.92 -

 AC:     356.3  mm    G. Age:   39w 4d       93  %    FL/BPD:     76.1   71 - 87
 FL:      68.6  mm    G. Age:   35w 1d      < 3  %    FL/AC:      19.3   20 - 24

 Est. FW:    [3A]  gm      7 lb 7 oz     76  %
Gestational Age

 LMP:           38w 1d       Date:   [DATE]                 EDD:   [DATE]
 U/S Today:     37w 1d                                        EDD:   [DATE]
 Best:          38w 1d    Det. By:   LMP  ([DATE])          EDD:   [DATE]
Anatomy

 Cranium:           Appears normal      Aortic Arch:       Previously seen
 Fetal Cavum:       Previously seen     Ductal Arch:       Previously seen
 Ventricles:        Appears normal      Diaphragm:         Appears normal
 Choroid Plexus:    Previously seen     Stomach:           Appears normal
 Cerebellum:        Previously seen     Abdomen:           Previously seen
 Posterior Fossa:   Previously seen     Abdominal Wall:    Previously seen
 Nuchal Fold:       Previously seen     Cord Vessels:      Previously seen
 Face:              Previously seen     Kidneys:           Appear normal
 Heart:             Not well            Bladder:           Appears normal
                    visualized
                    today. Seen
                    previously.
 RVOT:              Previously seen     Spine:             Previously seen
 LVOT:              Previously seen     Limbs:             Previously seen

 Other:     Fetus appears to be a female, prev. seen. Heels and 5th
            digit previously seen. Nasal bone prev. visualized.
Cervix Uterus Adnexa

 Cervix:       Not visualized (advanced GA >34 wks)

 Adnexa:     No abnormality visualized.
Impression

 Single intrauterine gestation demonstrating an estimated
 gestational age by ultrasound of 37w 1d. This is correlated
 with expected estimated gestational age by LMP of 38w 1d.
 EFW is currently at the 76%.

 No late developing fetal anatomic abnormalities are noted
 associated with the stomach, kidneys or bladder. The four
 chamber heart and lateral ventricles could not be well
 assessed due to fetal positioning combined with advanced
 gestational age .

 Subjectively and quantitatively normal amniotic fluid volume.

## 2011-07-03 ENCOUNTER — Ambulatory Visit (INDEPENDENT_AMBULATORY_CARE_PROVIDER_SITE_OTHER): Payer: Medicaid Other | Admitting: Family Medicine

## 2011-07-03 ENCOUNTER — Encounter: Payer: Self-pay | Admitting: Family Medicine

## 2011-07-03 VITALS — BP 119/73 | Temp 98.8°F | Wt 175.7 lb

## 2011-07-03 DIAGNOSIS — N899 Noninflammatory disorder of vagina, unspecified: Secondary | ICD-10-CM

## 2011-07-03 DIAGNOSIS — O24919 Unspecified diabetes mellitus in pregnancy, unspecified trimester: Secondary | ICD-10-CM

## 2011-07-03 DIAGNOSIS — N898 Other specified noninflammatory disorders of vagina: Secondary | ICD-10-CM

## 2011-07-03 LAB — POCT URINALYSIS DIP (DEVICE)
Protein, ur: NEGATIVE mg/dL
Specific Gravity, Urine: 1.015 (ref 1.005–1.030)
Urobilinogen, UA: 0.2 mg/dL (ref 0.0–1.0)
pH: 6.5 (ref 5.0–8.0)

## 2011-07-03 LAB — WET PREP, GENITAL
Trich, Wet Prep: NONE SEEN
Yeast Wet Prep HPF POC: NONE SEEN

## 2011-07-03 NOTE — Progress Notes (Signed)
U/s on 6/7 shows 7lb 7 oz, AC is ahead.  Not scheduled for IOL, will schedule at 39 wks.  External os 1 cm, cannot get all the way through She has not checked her BS this week. Check CBG today. C/o vaginal irritation, check Wet prep.

## 2011-07-03 NOTE — Patient Instructions (Addendum)
Breastfeeding BENEFITS OF BREASTFEEDING For the baby  The first milk (colostrum) helps the baby's digestive system function better.   There are antibodies from the mother in the milk that help the baby fight off infections.   The baby has a lower incidence of asthma, allergies, and SIDS (sudden infant death syndrome).   The nutrients in breast milk are better than formulas for the baby and helps the baby's brain grow better.   Babies who breastfeed have less gas, colic, and constipation.  For the mother  Breastfeeding helps develop a very special bond between mother and baby.   It is more convenient, always available at the correct temperature and cheaper than formula feeding.   It burns calories in the mother and helps with losing weight that was gained during pregnancy.   It makes the uterus contract back down to normal size faster and slows bleeding following delivery.   Breastfeeding mothers have a lower risk of developing breast cancer.  NURSE FREQUENTLY  A healthy, full-term baby may breastfeed as often as every hour or space his or her feedings to every 3 hours.   How often to nurse will vary from baby to baby. Watch your baby for signs of hunger, not the clock.   Nurse as often as the baby requests, or when you feel the need to reduce the fullness of your breasts.   Awaken the baby if it has been 3 to 4 hours since the last feeding.   Frequent feeding will help the mother make more milk and will prevent problems like sore nipples and engorgement of the breasts.  BABY'S POSITION AT THE BREAST  Whether lying down or sitting, be sure that the baby's tummy is facing your tummy.   Support the breast with 4 fingers underneath the breast and the thumb above. Make sure your fingers are well away from the nipple and baby's mouth.   Stroke the baby's lips and cheek closest to the breast gently with your finger or nipple.   When the baby's mouth is open wide enough, place  all of your nipple and as much of the dark area around the nipple as possible into your baby's mouth.   Pull the baby in close so the tip of the nose and the baby's cheeks touch the breast during the feeding.  FEEDINGS  The length of each feeding varies from baby to baby and from feeding to feeding.   The baby must suck about 2 to 3 minutes for your milk to get to him or her. This is called a "let down." For this reason, allow the baby to feed on each breast as long as he or she wants. Your baby will end the feeding when he or she has received the right balance of nutrients.   To break the suction, put your finger into the corner of the baby's mouth and slide it between his or her gums before removing your breast from his or her mouth. This will help prevent sore nipples.  REDUCING BREAST ENGORGEMENT  In the first week after your baby is born, you may experience signs of breast engorgement. When breasts are engorged, they feel heavy, warm, full, and may be tender to the touch. You can reduce engorgement if you:   Nurse frequently, every 2 to 3 hours. Mothers who breastfeed early and often have fewer problems with engorgement.   Place light ice packs on your breasts between feedings. This reduces swelling. Wrap the ice packs in a   lightweight towel to protect your skin.   Apply moist hot packs to your breast for 5 to 10 minutes before each feeding. This increases circulation and helps the milk flow.   Gently massage your breast before and during the feeding.   Make sure that the baby empties at least one breast at every feeding before switching sides.   Use a breast pump to empty the breasts if your baby is sleepy or not nursing well. You may also want to pump if you are returning to work or or you feel you are getting engorged.   Avoid bottle feeds, pacifiers or supplemental feedings of water or juice in place of breastfeeding.   Be sure the baby is latched on and positioned properly while  breastfeeding.   Prevent fatigue, stress, and anemia.   Wear a supportive bra, avoiding underwire styles.   Eat a balanced diet with enough fluids.  If you follow these suggestions, your engorgement should improve in 24 to 48 hours. If you are still experiencing difficulty, call your lactation consultant or caregiver. IS MY BABY GETTING ENOUGH MILK? Sometimes, mothers worry about whether their babies are getting enough milk. You can be assured that your baby is getting enough milk if:  The baby is actively sucking and you hear swallowing.   The baby nurses at least 8 to 12 times in a 24 hour time period. Nurse your baby until he or she unlatches or falls asleep at the first breast (at least 10 to 20 minutes), then offer the second side.   The baby is wetting 5 to 6 disposable diapers (6 to 8 cloth diapers) in a 24 hour period by 5 to 6 days of age.   The baby is having at least 2 to 3 stools every 24 hours for the first few months. Breast milk is all the food your baby needs. It is not necessary for your baby to have water or formula. In fact, to help your breasts make more milk, it is best not to give your baby supplemental feedings during the early weeks.   The stool should be soft and yellow.   The baby should gain 4 to 7 ounces per week after he is 4 days old.  TAKE CARE OF YOURSELF Take care of your breasts by:  Bathing or showering daily.   Avoiding the use of soaps on your nipples.   Start feedings on your left breast at one feeding and on your right breast at the next feeding.   You will notice an increase in your milk supply 2 to 5 days after delivery. You may feel some discomfort from engorgement, which makes your breasts very firm and often tender. Engorgement "peaks" out within 24 to 48 hours. In the meantime, apply warm moist towels to your breasts for 5 to 10 minutes before feeding. Gentle massage and expression of some milk before feeding will soften your breasts, making  it easier for your baby to latch on. Wear a well fitting nursing bra and air dry your nipples for 10 to 15 minutes after each feeding.   Only use cotton bra pads.   Only use pure lanolin on your nipples after nursing. You do not need to wash it off before nursing.  Take care of yourself by:   Eating well-balanced meals and nutritious snacks.   Drinking milk, fruit juice, and water to satisfy your thirst (about 8 glasses a day).   Getting plenty of rest.   Increasing calcium in   your diet (1200 mg a day).   Avoiding foods that you notice affect the baby in a bad way.  SEEK MEDICAL CARE IF:   You have any questions or difficulty with breastfeeding.   You need help.   You have a hard, red, sore area on your breast, accompanied by a fever of 100.5 F (38.1 C) or more.   Your baby is too sleepy to eat well or is having trouble sleeping.   Your baby is wetting less than 6 diapers per day, by 5 days of age.   Your baby's skin or white part of his or her eyes is more yellow than it was in the hospital.   You feel depressed.  Document Released: 01/09/2005 Document Revised: 12/29/2010 Document Reviewed: 08/24/2008 [employer] Patient Information 2012 [employer]. Labor Induction  Most women go into labor on their own between 37 and 42 weeks of the pregnancy. When this does not happen or when there is a medical need, medicine or other methods may be used to induce labor. Labor induction causes a pregnant woman's uterus to contract. It also causes the cervix to soften (ripen), open (dilate), and thin out (efface). Usually, labor is not induced before 39 weeks of the pregnancy unless there is a problem with the baby or mother. Whether your labor will be induced depends on a number of factors, including the following:  The medical condition of you and the baby.   How many weeks along you are.   The status of baby's lung maturity.   The condition of the cervix.   The position of the  baby.  REASONS FOR LABOR INDUCTION  The health of the baby or mother is at risk.   The pregnancy is overdue by 1 week or more.   The water breaks but labor does not start on its own.   The mother has a health condition or serious illness such as high blood pressure, infection, placental abruption, or diabetes.   The amniotic fluid amounts are low around the baby.   The baby is distressed.  REASONS TO NOT INDUCE LABOR Labor induction may not be a good idea if:  It is shown that your baby does not tolerate labor.   An induction is just more convenient.   You want the baby to be born on a certain date, like a holiday.   You have had previous surgeries on your uterus, such as a myomectomy or the removal of fibroids.   Your placenta lies very low in the uterus and blocks the opening of the cervix (placenta previa).   Your baby is not in a head down position.   The umbilical cord drops down into the birth canal in front of the baby. This could cut off the baby's blood and oxygen supply.   You have had a previous cesarean delivery.   There areunusual circumstances, such as the baby being extremely premature.  RISKS AND COMPLICATIONS Problems may occur in the process of induction and plans may need to be modified as a situation unfolds. Some of the risks of induction include:  Change in fetal heart rate, such as too high, too low, or erratic.   Risk of fetal distress.   Risk of infection to mother and baby.   Increased chance of having a cesarean delivery.   The rare, but increased chance that the placenta will separate from the uterus (abruption).   Uterine rupture (very rare).  When induction is needed for medical reasons,   the benefits of induction may outweigh the risks. BEFORE THE PROCEDURE Your caregiver will check your cervix and the baby's position. This will help your caregiver decide if you are far enough along for an induction to work. PROCEDURE Several methods  of labor induction may be used, such as:   Taking prostaglandin medicine to dilate and ripen the cervix. The medicine will also start contractions. It can be taken by mouth or by inserting a suppository into the vagina.   A thin tube (catheter) with a balloon on the end may be inserted into your vagina to dilate the cervix. Once inserted, the balloon expands with water, which causes the cervix to open.   Striping the membranes. Your caregiver inserts a finger between the cervix and membranes, which causes the cervix to be stretched and may cause the uterus to contract. This is often done during an office visit. You will be sent home to wait for the contractions to begin. You will then come in for an induction.   Breaking the water. Your caregiver will make a hole in the amniotic sac using a small instrument. Once the amniotic sac breaks, contractions should begin. This may still take hours to see an effect.   Taking medicine to trigger or strengthen contractions. This medicine is given intravenously through a tube in your arm.  All of the methods of induction, besides stripping the membranes, will be done in the hospital. Induction is done in the hospital so that you and the baby can be carefully monitored. AFTER THE PROCEDURE Some inductions can take up to 2 or 3 days. Depending on the cervix, it usually takes less time. It takes longer when you are induced early in the pregnancy or if this is your first pregnancy. If a mother is still pregnant and the induction has been going on for 2 to 3 days, either the mother will be sent home or a cesarean delivery will be needed. Document Released: 05/31/2006 Document Revised: 12/29/2010 Document Reviewed: 11/14/2010 [employer] Patient Information 2012 [employer]. 

## 2011-07-03 NOTE — Progress Notes (Signed)
IOL scheduled July 06, 2011 at 730 am. 

## 2011-07-03 NOTE — Progress Notes (Signed)
Pulse- 112  Pressure- lower abd Pt c/o irritation in vaginal

## 2011-07-04 ENCOUNTER — Telehealth: Payer: Self-pay | Admitting: *Deleted

## 2011-07-04 NOTE — Telephone Encounter (Signed)
Message copied by Jill Side on Tue Jul 04, 2011 12:14 PM ------      Message from: Reva Bores      Created: Tue Jul 04, 2011  9:24 AM       No yeast, no trich and no BV today--please inform pt.

## 2011-07-04 NOTE — Telephone Encounter (Signed)
Called pt and left message that I am calling with test results. Please call back to the nurse voice mail and leave a message if she would like a call back.

## 2011-07-05 NOTE — Telephone Encounter (Signed)
Pt left message yesterday @ 1341 that she was returning our call.

## 2011-07-05 NOTE — Telephone Encounter (Signed)
Called pt and informed her that her results were normal.  I advised pt that it could several things that could cause irritation- soap, laundry detergent, soaking baths.  Pt stated that it could be the saop.  I recommended to stop using stop use a mild soap.  Pt stated understanding and had no further questions.

## 2011-07-06 ENCOUNTER — Inpatient Hospital Stay (HOSPITAL_COMMUNITY)
Admission: RE | Admit: 2011-07-06 | Payer: Medicaid Other | Source: Ambulatory Visit | Admitting: Obstetrics & Gynecology

## 2011-07-10 ENCOUNTER — Encounter (HOSPITAL_COMMUNITY): Payer: Self-pay | Admitting: *Deleted

## 2011-07-10 ENCOUNTER — Inpatient Hospital Stay (HOSPITAL_COMMUNITY)
Admission: AD | Admit: 2011-07-10 | Discharge: 2011-07-10 | Disposition: A | Discharge status: Home / Self Care | Payer: Medicaid Other | Source: Ambulatory Visit | Attending: Obstetrics & Gynecology | Admitting: Obstetrics & Gynecology

## 2011-07-10 DIAGNOSIS — R030 Elevated blood-pressure reading, without diagnosis of hypertension: Secondary | ICD-10-CM

## 2011-07-10 DIAGNOSIS — O368190 Decreased fetal movements, unspecified trimester, not applicable or unspecified: Secondary | ICD-10-CM | POA: Insufficient documentation

## 2011-07-10 DIAGNOSIS — E109 Type 1 diabetes mellitus without complications: Secondary | ICD-10-CM | POA: Insufficient documentation

## 2011-07-10 DIAGNOSIS — O10019 Pre-existing essential hypertension complicating pregnancy, unspecified trimester: Secondary | ICD-10-CM | POA: Insufficient documentation

## 2011-07-10 DIAGNOSIS — O24919 Unspecified diabetes mellitus in pregnancy, unspecified trimester: Secondary | ICD-10-CM | POA: Insufficient documentation

## 2011-07-10 DIAGNOSIS — R109 Unspecified abdominal pain: Secondary | ICD-10-CM | POA: Insufficient documentation

## 2011-07-10 NOTE — Discharge Instructions (Signed)
Fetal Movement Counts Patient Name: __________________________________________________ Patient Due Date: ____________________ Kick counts is highly recommended in high risk pregnancies, but it is a good idea for every pregnant woman to do. Start counting fetal movements at 28 weeks of the pregnancy. Fetal movements increase after eating a full meal or eating or drinking something sweet (the blood sugar is higher). It is also important to drink plenty of fluids (well hydrated) before doing the count. Lie on your left side because it helps with the circulation or you can sit in a comfortable chair with your arms over your belly (abdomen) with no distractions around you. DOING THE COUNT  Try to do the count the same time of day each time you do it.   Mark the day and time, then see how long it takes for you to feel 10 movements (kicks, flutters, swishes, rolls). You should have at least 10 movements within 2 hours. You will most likely feel 10 movements in much less than 2 hours. If you do not, wait an hour and count again. After a couple of days you will see a pattern.   What you are looking for is a change in the pattern or not enough counts in 2 hours. Is it taking longer in time to reach 10 movements?  SEEK MEDICAL CARE IF:  You feel less than 10 counts in 2 hours. Tried twice.   No movement in one hour.   The pattern is changing or taking longer each day to reach 10 counts in 2 hours.   You feel the baby is not moving as it usually does.  Date: ____________ Movements: ____________ Start time: ____________ Finish time: ____________  Date: ____________ Movements: ____________ Start time: ____________ Finish time: ____________ Date: ____________ Movements: ____________ Start time: ____________ Finish time: ____________ Date: ____________ Movements: ____________ Start time: ____________ Finish time: ____________ Date: ____________ Movements: ____________ Start time: ____________ Finish time:  ____________ Date: ____________ Movements: ____________ Start time: ____________ Finish time: ____________ Date: ____________ Movements: ____________ Start time: ____________ Finish time: ____________ Date: ____________ Movements: ____________ Start time: ____________ Finish time: ____________  Date: ____________ Movements: ____________ Start time: ____________ Finish time: ____________ Date: ____________ Movements: ____________ Start time: ____________ Finish time: ____________ Date: ____________ Movements: ____________ Start time: ____________ Finish time: ____________ Date: ____________ Movements: ____________ Start time: ____________ Finish time: ____________ Date: ____________ Movements: ____________ Start time: ____________ Finish time: ____________ Date: ____________ Movements: ____________ Start time: ____________ Finish time: ____________ Date: ____________ Movements: ____________ Start time: ____________ Finish time: ____________  Date: ____________ Movements: ____________ Start time: ____________ Finish time: ____________ Date: ____________ Movements: ____________ Start time: ____________ Finish time: ____________ Date: ____________ Movements: ____________ Start time: ____________ Finish time: ____________ Date: ____________ Movements: ____________ Start time: ____________ Finish time: ____________ Date: ____________ Movements: ____________ Start time: ____________ Finish time: ____________ Date: ____________ Movements: ____________ Start time: ____________ Finish time: ____________ Date: ____________ Movements: ____________ Start time: ____________ Finish time: ____________  Date: ____________ Movements: ____________ Start time: ____________ Finish time: ____________ Date: ____________ Movements: ____________ Start time: ____________ Finish time: ____________ Date: ____________ Movements: ____________ Start time: ____________ Finish time: ____________ Date: ____________ Movements:  ____________ Start time: ____________ Finish time: ____________ Date: ____________ Movements: ____________ Start time: ____________ Finish time: ____________ Date: ____________ Movements: ____________ Start time: ____________ Finish time: ____________ Date: ____________ Movements: ____________ Start time: ____________ Finish time: ____________  Date: ____________ Movements: ____________ Start time: ____________ Finish time: ____________ Date: ____________ Movements: ____________ Start time: ____________ Finish time: ____________ Date: ____________ Movements: ____________ Start time:   ____________ Finish time: ____________ Date: ____________ Movements: ____________ Start time: ____________ Finish time: ____________ Date: ____________ Movements: ____________ Start time: ____________ Finish time: ____________ Date: ____________ Movements: ____________ Start time: ____________ Finish time: ____________ Date: ____________ Movements: ____________ Start time: ____________ Finish time: ____________  Date: ____________ Movements: ____________ Start time: ____________ Finish time: ____________ Date: ____________ Movements: ____________ Start time: ____________ Finish time: ____________ Date: ____________ Movements: ____________ Start time: ____________ Finish time: ____________ Date: ____________ Movements: ____________ Start time: ____________ Finish time: ____________ Date: ____________ Movements: ____________ Start time: ____________ Finish time: ____________ Date: ____________ Movements: ____________ Start time: ____________ Finish time: ____________ Date: ____________ Movements: ____________ Start time: ____________ Finish time: ____________  Date: ____________ Movements: ____________ Start time: ____________ Finish time: ____________ Date: ____________ Movements: ____________ Start time: ____________ Finish time: ____________ Date: ____________ Movements: ____________ Start time: ____________ Finish  time: ____________ Date: ____________ Movements: ____________ Start time: ____________ Finish time: ____________ Date: ____________ Movements: ____________ Start time: ____________ Finish time: ____________ Date: ____________ Movements: ____________ Start time: ____________ Finish time: ____________ Date: ____________ Movements: ____________ Start time: ____________ Finish time: ____________  Date: ____________ Movements: ____________ Start time: ____________ Finish time: ____________ Date: ____________ Movements: ____________ Start time: ____________ Finish time: ____________ Date: ____________ Movements: ____________ Start time: ____________ Finish time: ____________ Date: ____________ Movements: ____________ Start time: ____________ Finish time: ____________ Date: ____________ Movements: ____________ Start time: ____________ Finish time: ____________ Date: ____________ Movements: ____________ Start time: ____________ Finish time: ____________ Document Released: 02/08/2006 Document Revised: 12/29/2010 Document Reviewed: 08/11/2008 [employer] Patient Information 2012 [employer]. 

## 2011-07-10 NOTE — MAU Provider Note (Addendum)
History     CSN: [id number]  Arrival date and time: 07/10/11 1332   None     Chief Complaint  Patient presents with  . Abdominal Pain   HPI sharp pains, decreased fetal movement  JON KASPAREK is a 26 y.o. female G26P1001 [redacted]w[redacted]d who presents with a several day history of intermittent "sharp pains". Pt reports she had a sharp pain this morning and subsequently felt decreased fetal movement prompting her to be seen.  Pt denies bleeding, LOF, but does endorse intermittent/Braxton Hicks contractions - none today.  OB History    Grav Para Term Preterm Abortions TAB SAB Ect Mult Living   2 1 1  0 0 0 0 0 0 1      Past Medical History  Diagnosis Date  . Asthma   . Gestational diabetes     GDM  . Unspecified high-risk pregnancy 01/27/2011  . Sickle cell trait     Past Surgical History  Procedure Date  . Tonsillectomy     Family History  Problem Relation Age of Onset  . Sickle cell trait Mother   . Depression Mother   . Asthma Father   . Drug abuse Father     heroin abuse  . Kidney disease Father     due to heroin abuse  . Diabetes Maternal Grandmother     History  Substance Use Topics  . Smoking status: Never Smoker   . Smokeless tobacco: Never Used  . Alcohol Use: No    Allergies: No Known Allergies  Prescriptions prior to admission  Medication Sig Dispense Refill  . albuterol (PROVENTIL HFA;VENTOLIN HFA) 108 (90 BASE) MCG/ACT inhaler Inhale 2 puffs into the lungs every 6 (six) hours as needed for wheezing.  1 Inhaler  2  . calcium carbonate (TUMS - DOSED IN MG ELEMENTAL CALCIUM) 500 MG chewable tablet Chew 1 tablet by mouth 2 (two) times daily as needed.       . Prenatal Vit-Fe Fumarate-FA (PRENATAL MULTIVITAMIN) TABS Take 1 tablet by mouth daily.  30 tablet  6  . ACCU-CHEK FASTCLIX LANCETS MISC 1 Units by Percutaneous route 4 (four) times daily.  100 each  12  . Blood Glucose Monitoring Suppl (ACCU-CHEK NANO SMARTVIEW) W/DEVICE KIT 1 Units by Does not  apply route as directed. Check fasting, and two hours after breakfast, lunch and dinner  1 kit  0  . glucose blood test strip Use as instructed to check blood sugars  100 each  3    ROS  Review of Systems  Constitutional: Negative for fever, chills, weight loss and malaise/fatigue.  HENT: Negative for ear pain, congestion and sore throat.   Eyes: Negative for blurred vision, double vision, photophobia and pain.  Respiratory: Positive for shortness of breath (hx asthma, controlled on inhalers - last used 1 mo ago). Negative for cough, wheezing and stridor.   Cardiovascular: Negative for chest pain and palpitations.  Gastrointestinal: Positive for heartburn and abdominal pain (sharp, intermittent for last few days, lasting minutes, nothing taken). Negative for vomiting, diarrhea and constipation.  Genitourinary: Positive for frequency. Negative for dysuria, urgency, hematuria and flank pain.  Skin: Positive for rash (maculo-papular, inframammary).  Neurological: Positive for headaches (frontal, intermittent, < 1 hr). Negative for dizziness, seizures and weakness.  Psychiatric/Behavioral: Negative for depression and substance abuse. The patient is not nervous/anxious.    Physical Exam   Blood pressure 112/66, pulse 106, temperature 98.7 F (37.1 C), temperature source Oral, resp. rate 16, height 5\' 1"  (1.549  m), weight 80.377 kg (177 lb 3.2 oz), last menstrual period 10/06/2010, SpO2 99.00%, unknown if currently breastfeeding.  Physical Exam Constitutional: She appears well-developed and well-nourished. No distress.  HENT:  Head: Normocephalic.  Eyes: Pupils are equal, round, and reactive to light.  Cardiovascular: Regular rhythm, normal heart sounds and intact distal pulses.   No murmur heard. Respiratory: Effort normal and breath sounds normal. No respiratory distress. She has no wheezes.  GI: Bowel sounds are normal. She exhibits distension and mass. There is tenderness (generalized  pressure). There is no rebound.  Neurological: She is alert.  Skin: Skin is warm and dry. Rash (maculo-papular, inframammary) noted.  Cervical Exam: Dilation:  (External os 2-3 cm's, inner os closed) Effacement (%): Thick Cervical Position: Anterior Station: -2 Presentation: Vertex Exam by:: Dr. Adrian Blackwater  Fetal Exam Fetal Monitor Review: Mode: ultrasound.   Baseline rate: 140s.  Variability: moderate (6-25 bpm).   Pattern: accelerations present, no decelerations.   Fetal State Assessment: Category I - tracings are normal.  MAU Course  Procedures NST - category 1 tracing.  Accelerations present.    MDM Pt felt fetal movements at 1530.  Assessment and Plan  1. Decreased fetal movement/Missed IOL 3 days ago --- Pt DM A1 currently diet controlled, 1hr GTT abnormal, no record of 3hr GTT (done at health dept) --- Borderline HTN currently not on medications, no HTN today --- IOL will be performed at 40.0 weeks.   ---L&D contacted, pt scheduled for induction on Thursday --- Pt advised to come to [facility] if fetal movement is not felt for period of >20 min  Ricardo Jericho 07/10/2011, 4:08 PM  Patient seen and examined.  Agree with above note.  Levie Heritage, DO 07/10/2011 4:31 PM

## 2011-07-10 NOTE — MAU Note (Signed)
Patient states she is gestational diabetic diet controlled. Was scheduled for IOL on 6-13 and did not come. States the baby had not been moving earlier but now has good movement.

## 2011-07-10 NOTE — MAU Note (Signed)
Patient states she started having sharp lower abdominal pain at about 1200. Denies any contractions, no bleeding or leaking and feels good fetal movement at this time.

## 2011-07-10 NOTE — H&P (Deleted)
Jill Clarke is a 26 y.o. female G2P1001 [redacted]w[redacted]d.  Maternal Medical History:  Reason for admission: IOL scheduled for 3 days ago, decreased fetal movement  Contractions: Onset was more than 2 days ago.   Frequency: rare.   Perceived severity is mild.    Fetal activity: Perceived fetal activity is decreased.   Last perceived fetal movement was within the past hour.    Prenatal complications: Hypertension (borderline) and infection (trichomonoas, early pregnancy, treated).   No bleeding, HIV, IUGR, oligohydramnios, polyhydramnios, pre-eclampsia or substance abuse.   Prenatal Complications - Diabetes: gestational. Diabetes is managed by diet.      OB History    Grav Para Term Preterm Abortions TAB SAB Ect Mult Living   2 1 1  0 0 0 0 0 0 1     Past Medical History  Diagnosis Date  . Asthma   . Gestational diabetes     GDM  . Unspecified high-risk pregnancy 01/27/2011  . Sickle cell trait    Past Surgical History  Procedure Date  . Tonsillectomy    Family History: family history includes Asthma in her father; Depression in her mother; Diabetes in her maternal grandmother; Drug abuse in her father; Kidney disease in her father; and Sickle cell trait in her mother. Social History:  reports that she has never smoked. She has never used smokeless tobacco. She reports that she does not drink alcohol or use illicit drugs.   Prenatal Transfer Tool  Maternal Diabetes: Yes:  Diabetes Type:  Diet controlled Genetic Screening: Normal Maternal Ultrasounds/Referrals: Normal Fetal Ultrasounds or other Referrals:  Other: see prenatal record Maternal Substance Abuse:  No Significant Maternal Medications:  None Significant Maternal Lab Results:  None Other Comments:  None  Review of Systems  Constitutional: Negative for fever, chills, weight loss and malaise/fatigue.  HENT: Negative for ear pain, congestion and sore throat.   Eyes: Negative for blurred vision, double vision,  photophobia and pain.  Respiratory: Positive for shortness of breath (hx asthma, controlled on inhalers - last used 1 mo ago). Negative for cough, wheezing and stridor.   Cardiovascular: Negative for chest pain and palpitations.  Gastrointestinal: Positive for heartburn and abdominal pain (sharp, intermittent for last few days, lasting minutes, nothing taken). Negative for vomiting, diarrhea and constipation.  Genitourinary: Positive for frequency. Negative for dysuria, urgency, hematuria and flank pain.  Skin: Positive for rash (maculo-papular, inframammary).  Neurological: Positive for headaches (frontal, intermittent, < 1 hr). Negative for dizziness, seizures and weakness.  Psychiatric/Behavioral: Negative for depression and substance abuse. The patient is not nervous/anxious.       Blood pressure 112/66, pulse 106, temperature 98.7 F (37.1 C), temperature source Oral, resp. rate 16, height 5\' 1"  (1.549 m), weight 80.377 kg (177 lb 3.2 oz), last menstrual period 10/06/2010, SpO2 99.00%, unknown if currently breastfeeding. Maternal Exam:  Abdomen: Fetal presentation: vertex  Introitus: Ferning test: not done.  Nitrazine test: not done.  Pelvis: adequate for delivery.   Cervix: not evaluated.   Fetal Exam Fetal Monitor Review: Mode: ultrasound.   Baseline rate: 140s.  Variability: moderate (6-25 bpm).   Pattern: no accelerations and no decelerations.    Fetal State Assessment: Category I - tracings are normal.     Physical Exam  Constitutional: She appears well-developed and well-nourished. No distress.  HENT:  Head: Normocephalic.  Eyes: Pupils are equal, round, and reactive to light.  Cardiovascular: Regular rhythm, normal heart sounds and intact distal pulses.   No murmur heard. Respiratory: Effort  normal and breath sounds normal. No respiratory distress. She has no wheezes.  GI: Bowel sounds are normal. She exhibits distension and mass. There is tenderness (generalized  pressure). There is no rebound.  Neurological: She is alert.  Skin: Skin is warm and dry. Rash (maculo-papular, inframammary) noted.    Prenatal labs: ABO, Rh: --/--/O POS (10/27 1610) Antibody: Negative (12/17 0000) Rubella: Immune (12/17 0000) RPR: Nonreactive (12/17 0000)  HBsAg: Negative (12/17 0000)  HIV: NON REACTIVE (02/25 0847)  GBS:     Assessment/Plan: 1. IOL, decreased fetal movement ---Admit to Floor ---GBS Negative - per patient ---nothing wanted for pain  Ricardo Jericho 07/10/2011, 3:01 PM

## 2011-07-11 ENCOUNTER — Encounter (HOSPITAL_COMMUNITY): Payer: Self-pay | Admitting: *Deleted

## 2011-07-11 ENCOUNTER — Telehealth (HOSPITAL_COMMUNITY): Payer: Self-pay | Admitting: *Deleted

## 2011-07-11 NOTE — Telephone Encounter (Signed)
Preadmission screen  

## 2011-07-13 ENCOUNTER — Encounter (HOSPITAL_COMMUNITY): Payer: Self-pay

## 2011-07-13 ENCOUNTER — Inpatient Hospital Stay (HOSPITAL_COMMUNITY)
Admission: AD | Admit: 2011-07-13 | Discharge: 2011-07-15 | DRG: 774 | Disposition: A | Discharge status: Home / Self Care | Payer: Medicaid Other | Source: Ambulatory Visit | Attending: Obstetrics & Gynecology | Admitting: Obstetrics & Gynecology

## 2011-07-13 VITALS — BP 103/56 | HR 98 | Temp 97.8°F | Resp 18 | Ht 61.0 in | Wt 177.0 lb

## 2011-07-13 DIAGNOSIS — O219 Vomiting of pregnancy, unspecified: Secondary | ICD-10-CM

## 2011-07-13 DIAGNOSIS — O24919 Unspecified diabetes mellitus in pregnancy, unspecified trimester: Secondary | ICD-10-CM

## 2011-07-13 DIAGNOSIS — R030 Elevated blood-pressure reading, without diagnosis of hypertension: Secondary | ICD-10-CM

## 2011-07-13 DIAGNOSIS — E109 Type 1 diabetes mellitus without complications: Secondary | ICD-10-CM | POA: Diagnosis present

## 2011-07-13 DIAGNOSIS — O139 Gestational [pregnancy-induced] hypertension without significant proteinuria, unspecified trimester: Secondary | ICD-10-CM | POA: Diagnosis present

## 2011-07-13 DIAGNOSIS — O2432 Unspecified pre-existing diabetes mellitus in childbirth: Secondary | ICD-10-CM

## 2011-07-13 LAB — CBC
MCH: 29.7 pg (ref 26.0–34.0)
MCV: 90 fL (ref 78.0–100.0)
Platelets: 368 10*3/uL (ref 150–400)
RDW: 14 % (ref 11.5–15.5)
WBC: 14.4 10*3/uL — ABNORMAL HIGH (ref 4.0–10.5)

## 2011-07-13 MED ORDER — ALBUTEROL SULFATE HFA 108 (90 BASE) MCG/ACT IN AERS: 2.0000 | INHALATION_SPRAY | Freq: Four times a day (QID) | RESPIRATORY_TRACT | Status: DC | PRN

## 2011-07-13 MED ORDER — WITCH HAZEL-GLYCERIN EX PADS: 1.0000 "application " | MEDICATED_PAD | CUTANEOUS | Status: DC | PRN

## 2011-07-13 MED ORDER — DIBUCAINE 1 % RE OINT: 1.0000 "application " | TOPICAL_OINTMENT | RECTAL | Status: DC | PRN

## 2011-07-13 MED ORDER — ONDANSETRON HCL 4 MG/2ML IJ SOLN: 4.0000 mg | INTRAMUSCULAR | Status: DC | PRN

## 2011-07-13 MED ORDER — LACTATED RINGERS IV SOLN: 500.0000 mL | INTRAVENOUS | Status: DC | PRN

## 2011-07-13 MED ORDER — OXYTOCIN 40 UNITS IN LACTATED RINGERS INFUSION - SIMPLE MED: 62.5000 mL/h | Freq: Once | INTRAVENOUS | Status: DC

## 2011-07-13 MED ORDER — ONDANSETRON HCL 4 MG/2ML IJ SOLN: 4.0000 mg | Freq: Four times a day (QID) | INTRAMUSCULAR | Status: DC | PRN

## 2011-07-13 MED ORDER — LIDOCAINE HCL (PF) 1 % IJ SOLN: 30.0000 mL | INTRAMUSCULAR | Status: DC | PRN

## 2011-07-13 MED ORDER — OXYTOCIN BOLUS FROM INFUSION: 250.0000 mL | Freq: Once | INTRAVENOUS | Status: DC

## 2011-07-13 MED ORDER — CITRIC ACID-SODIUM CITRATE 334-500 MG/5ML PO SOLN: 30.0000 mL | ORAL | Status: DC | PRN

## 2011-07-13 MED ORDER — OXYCODONE-ACETAMINOPHEN 5-325 MG PO TABS
1.0000 | ORAL_TABLET | ORAL | Status: DC | PRN
Start: 2011-07-13 — End: 2011-07-13

## 2011-07-13 MED ORDER — OXYTOCIN BOLUS FROM INFUSION
250.0000 mL | Freq: Once | INTRAVENOUS | Status: DC
  Filled 2011-07-13: qty 500

## 2011-07-13 MED ORDER — LANOLIN HYDROUS EX OINT: TOPICAL_OINTMENT | CUTANEOUS | Status: DC | PRN

## 2011-07-13 MED ORDER — LACTATED RINGERS IV SOLN: INTRAVENOUS | Status: DC

## 2011-07-13 MED ORDER — ACETAMINOPHEN 325 MG PO TABS: 650.0000 mg | ORAL_TABLET | ORAL | Status: DC | PRN

## 2011-07-13 MED ORDER — LIDOCAINE HCL (PF) 1 % IJ SOLN
30.0000 mL | INTRAMUSCULAR | Status: DC | PRN
  Filled 2011-07-13 (×2): qty 30

## 2011-07-13 MED ORDER — SIMETHICONE 80 MG PO CHEW: 80.0000 mg | CHEWABLE_TABLET | ORAL | Status: DC | PRN

## 2011-07-13 MED ORDER — FLEET ENEMA 7-19 GM/118ML RE ENEM: 1.0000 | ENEMA | RECTAL | Status: DC | PRN

## 2011-07-13 MED ORDER — BENZOCAINE-MENTHOL 20-0.5 % EX AERO
1.0000 "application " | INHALATION_SPRAY | CUTANEOUS | Status: DC | PRN
  Filled 2011-07-13: qty 56

## 2011-07-13 MED ORDER — IBUPROFEN 600 MG PO TABS
600.0000 mg | ORAL_TABLET | Freq: Four times a day (QID) | ORAL | Status: DC | PRN
Start: 2011-07-13 — End: 2011-07-13

## 2011-07-13 MED ORDER — SENNOSIDES-DOCUSATE SODIUM 8.6-50 MG PO TABS
2.0000 | ORAL_TABLET | Freq: Every day | ORAL | Status: DC
  Administered 2011-07-14: 2 via ORAL

## 2011-07-13 MED ORDER — OXYCODONE-ACETAMINOPHEN 5-325 MG PO TABS
1.0000 | ORAL_TABLET | ORAL | Status: DC | PRN
  Administered 2011-07-14 – 2011-07-15 (×2): 1 via ORAL
  Filled 2011-07-13 (×2): qty 1

## 2011-07-13 MED ORDER — DIPHENHYDRAMINE HCL 25 MG PO CAPS: 25.0000 mg | ORAL_CAPSULE | Freq: Four times a day (QID) | ORAL | Status: DC | PRN

## 2011-07-13 MED ORDER — ZOLPIDEM TARTRATE 5 MG PO TABS: 5.0000 mg | ORAL_TABLET | Freq: Every evening | ORAL | Status: DC | PRN

## 2011-07-13 MED ORDER — ONDANSETRON HCL 4 MG PO TABS: 4.0000 mg | ORAL_TABLET | ORAL | Status: DC | PRN

## 2011-07-13 MED ORDER — OXYCODONE-ACETAMINOPHEN 5-325 MG PO TABS: 1.0000 | ORAL_TABLET | ORAL | Status: DC | PRN

## 2011-07-13 MED ORDER — LACTATED RINGERS IV SOLN
INTRAVENOUS | Status: DC
  Administered 2011-07-13: 16:00:00 via INTRAVENOUS

## 2011-07-13 MED ORDER — IBUPROFEN 600 MG PO TABS
600.0000 mg | ORAL_TABLET | Freq: Four times a day (QID) | ORAL | Status: DC
  Administered 2011-07-14 – 2011-07-15 (×6): 600 mg via ORAL
  Filled 2011-07-13 (×6): qty 1

## 2011-07-13 MED ORDER — TETANUS-DIPHTH-ACELL PERTUSSIS 5-2.5-18.5 LF-MCG/0.5 IM SUSP
0.5000 mL | Freq: Once | INTRAMUSCULAR | Status: AC
  Administered 2011-07-15: 0.5 mL via INTRAMUSCULAR
  Filled 2011-07-13: qty 0.5

## 2011-07-13 MED ORDER — OXYTOCIN 40 UNITS IN LACTATED RINGERS INFUSION - SIMPLE MED
62.5000 mL/h | Freq: Once | INTRAVENOUS | Status: AC
  Administered 2011-07-13: 39.96 [IU]/h via INTRAVENOUS
  Filled 2011-07-13: qty 1000

## 2011-07-13 MED ORDER — IBUPROFEN 600 MG PO TABS
600.0000 mg | ORAL_TABLET | Freq: Four times a day (QID) | ORAL | Status: DC | PRN
  Administered 2011-07-13: 600 mg via ORAL
  Filled 2011-07-13: qty 1

## 2011-07-13 MED ORDER — PRENATAL MULTIVITAMIN CH
1.0000 | ORAL_TABLET | Freq: Every day | ORAL | Status: DC
  Administered 2011-07-14 – 2011-07-15 (×2): 1 via ORAL
  Filled 2011-07-13 (×2): qty 1

## 2011-07-13 NOTE — H&P (Signed)
Jill Clarke is a 26 y.o. female presenting for IOL for DM A1 (diet controlled, 1h GTT abn, 3h GTT done at health dept, no record here), borderline HTN during this pregnancy.  She presents having contractions which started yesterday evening and have gained momentum, currently 2-3 minutes apart and intense enough not to be able to speak through them.  No vaginal bleeding, loss of fluid.  Maternal Medical History:  Fetal activity: Perceived fetal activity is normal.   Last perceived fetal movement was within the past hour.    Prenatal Complications - Diabetes: gestational. Diabetes is managed by diet.      OB History    Grav Para Term Preterm Abortions TAB SAB Ect Mult Living   2 1 1  0 0 0 0 0 0 1     Past Medical History  Diagnosis Date  . Unspecified high-risk pregnancy 01/27/2011  . Sickle cell trait   . Asthma     uses inhaler prn  . Gestational diabetes     GDM; diet-controlled   Past Surgical History  Procedure Date  . Tonsillectomy    Family History: family history includes Asthma in her father; Depression in her mother; Diabetes in her maternal grandmother; Drug abuse in her father; Kidney disease in her father; and Sickle cell trait in her mother. Social History:  reports that she has never smoked. She has never used smokeless tobacco. She reports that she does not drink alcohol or use illicit drugs.   Prenatal Transfer Tool  Maternal Diabetes: Yes:  Diabetes Type:  Diet controlled Genetic screening: Unknown.  Nuchal translucency normal. Maternal Ultrasounds/Referrals: Normal Fetal Ultrasounds or other Referrals:  None Maternal Substance Abuse:  No Significant Maternal Medications:  None Significant Maternal Lab Results:  None Other Comments:  None  Review of Systems  Constitutional: Negative for fever and chills.  HENT: Negative for congestion and sore throat.   Respiratory: Negative for cough and shortness of breath.   Cardiovascular: Negative for chest  pain and palpitations.  Gastrointestinal: Positive for heartburn, vomiting, abdominal pain (Having intense contractions) and constipation. Negative for diarrhea and blood in stool.  Genitourinary: Negative for dysuria and hematuria.  Psychiatric/Behavioral: Negative for depression. The patient is not nervous/anxious.   All other pertinent ROS negative.  Dilation: 4.5 Effacement (%): 100 Station: 0 Blood pressure 116/78, pulse 107, temperature 98.2 F (36.8 C), temperature source Oral, resp. rate 20, height 5\' 1"  (1.549 m), weight 80.287 kg (177 lb), last menstrual period 10/06/2010, unknown if currently breastfeeding.  Maternal Exam:  Uterine Assessment: Contraction strength is firm.  Contraction duration is 1 minute. Contraction frequency is regular.   Abdomen: Fundal height is Appropriate for gestation age.   Fetal presentation: vertex  Introitus: Normal vulva. Ferning test: not done.  Nitrazine test: not done.  Pelvis: adequate for delivery.   Cervix: Cervix evaluated by digital exam.     Fetal Exam Fetal Monitor Review: Baseline rate: 130.  Variability: moderate (6-25 bpm).   Pattern: accelerations present and no decelerations.    Fetal State Assessment: Category I - tracings are normal.     Physical Exam  Constitutional: She is oriented to person, place, and time. She appears well-developed and well-nourished. No distress.  HENT:  Head: Normocephalic and atraumatic.  Eyes: Conjunctivae are normal. Right eye exhibits no discharge. Left eye exhibits no discharge. No scleral icterus.  Neck: No tracheal deviation present.  Cardiovascular: Normal rate, regular rhythm and normal heart sounds.   Respiratory: Effort normal and breath  sounds normal. No stridor. No respiratory distress.  GI: Soft. She exhibits no distension. There is no tenderness. There is no rebound and no guarding.       Gravid  Neurological: She is alert and oriented to person, place, and time.  Coordination normal.  Skin: Skin is warm and dry. She is not diaphoretic.  Psychiatric: She has a normal mood and affect. Her behavior is normal. Judgment and thought content normal.    Prenatal labs: ABO, Rh: --/--/O POS (10/27 4098) Antibody: Negative (12/17 0000) Rubella: Immune (12/17 0000) RPR: Nonreactive (12/17 0000)  HBsAg: Negative (12/17 0000)  HIV: NON REACTIVE (02/25 0847)  GBS: Negative (05/20 0000)   Assessment/Plan: 26 yo admitted for IOL Already in active labor Expectant management  CRANSTOUN, LANDI 07/13/2011, 3:46 PM  I have seen this patient and agree with the above resident's note.  LEFTWICH-KIRBY, Azzam Mehra Certified Nurse-Midwife

## 2011-07-14 LAB — RPR: RPR Ser Ql: NONREACTIVE

## 2011-07-14 NOTE — Progress Notes (Signed)
UR chart review completed.  

## 2011-07-14 NOTE — Progress Notes (Signed)
Post Partum Day 1 Subjective: no complaints, up ad lib, voiding and tolerating PO  Does not want to go home today as she had a friend who died after early discharge after vaginal delivery.  Objective: Blood pressure 108/73, pulse 88, temperature 98.3 F (36.8 C), temperature source Oral, resp. rate 18, height 5\' 1"  (1.549 m), weight 80.287 kg (177 lb), last menstrual period 10/06/2010, unknown if currently breastfeeding.  Physical Exam:  General: alert, cooperative, appears stated age and no distress Lochia: appropriate Uterine Fundus: firm DVT Evaluation: No evidence of DVT seen on physical exam.   Basename 07/13/11 1555  HGB 11.6*  HCT 35.2*    Assessment/Plan: Plan for discharge tomorrow, Breastfeeding and Contraception vasectomy Patient and family do not want lactation consultant   LOS: 1 day   Jaren Vanetten 07/14/2011, 7:24 AM

## 2011-07-14 NOTE — Progress Notes (Signed)
I have seen/examined this patient and agree with the resident's assessment and plan. Pt declines early d/c.  Lynnleigh Soden E. 10:51 AM

## 2011-07-15 LAB — GLUCOSE, CAPILLARY: Glucose-Capillary: 70 mg/dL (ref 70–99)

## 2011-07-15 MED ORDER — IBUPROFEN 600 MG PO TABS: 600.0000 mg | ORAL_TABLET | Freq: Four times a day (QID) | ORAL | Status: DC

## 2011-07-15 NOTE — Discharge Summary (Signed)
Obstetric Discharge Summary Reason for Admission: onset of labor Prenatal Procedures: ultrasound Intrapartum Procedures: spontaneous vaginal delivery Postpartum Procedures: none Complications-Operative and Postpartum: none Hemoglobin  Date Value Range Status  07/13/2011 11.6* 12.0 - 15.0 g/dL Final     HCT  Date Value Range Status  07/13/2011 35.2* 36.0 - 46.0 % Final   Fasting CBG   Basename 07/15/11 0805  GLUCAP 70       Physical Exam:  General: alert, cooperative, appears stated age and no distress Lochia: appropriate Uterine Fundus: firm Incision: N/A DVT Evaluation: No evidence of DVT seen on physical exam. Negative Homan's sign. No cords or calf tenderness. No significant calf/ankle edema.  Discharge Diagnoses: Term Pregnancy-delivered  Discharge Information: Date: 07/15/2011 Activity: pelvic rest Diet: routine Medications: Ibuprofen Condition: stable Instructions: refer to practice specific booklet Discharge to: home   Newborn Data: Live born female  Birth Weight: 7 lb 10.9 oz (3484 g) APGAR: 9, 9  Home with mother.  LEFTWICH-KIRBY, Shatima Zalar 07/15/2011, 7:37 AM

## 2011-07-18 NOTE — Discharge Summary (Signed)
Agree with above note.  Charidy Cappelletti 07/18/2011 7:28 AM   

## 2011-07-19 ENCOUNTER — Inpatient Hospital Stay (HOSPITAL_COMMUNITY)
Admission: AD | Admit: 2011-07-19 | Discharge: 2011-07-19 | Disposition: A | Discharge status: Home / Self Care | Payer: Medicaid Other | Source: Ambulatory Visit | Attending: Obstetrics & Gynecology | Admitting: Obstetrics & Gynecology

## 2011-07-19 ENCOUNTER — Encounter (HOSPITAL_COMMUNITY): Payer: Self-pay | Admitting: *Deleted

## 2011-07-19 DIAGNOSIS — J45909 Unspecified asthma, uncomplicated: Secondary | ICD-10-CM

## 2011-07-19 DIAGNOSIS — R0789 Other chest pain: Secondary | ICD-10-CM

## 2011-07-19 DIAGNOSIS — J309 Allergic rhinitis, unspecified: Secondary | ICD-10-CM

## 2011-07-19 DIAGNOSIS — R0602 Shortness of breath: Secondary | ICD-10-CM | POA: Insufficient documentation

## 2011-07-19 DIAGNOSIS — B373 Candidiasis of vulva and vagina: Secondary | ICD-10-CM | POA: Insufficient documentation

## 2011-07-19 DIAGNOSIS — B3731 Acute candidiasis of vulva and vagina: Secondary | ICD-10-CM | POA: Insufficient documentation

## 2011-07-19 DIAGNOSIS — R071 Chest pain on breathing: Secondary | ICD-10-CM | POA: Insufficient documentation

## 2011-07-19 DIAGNOSIS — J305 Allergic rhinitis due to food: Secondary | ICD-10-CM | POA: Insufficient documentation

## 2011-07-19 MED ORDER — ACETAMINOPHEN 325 MG PO TABS
650.0000 mg | ORAL_TABLET | Freq: Once | ORAL | Status: AC
  Administered 2011-07-19: 650 mg via ORAL
  Filled 2011-07-19: qty 2

## 2011-07-19 MED ORDER — ALBUTEROL SULFATE HFA 108 (90 BASE) MCG/ACT IN AERS
2.0000 | INHALATION_SPRAY | Freq: Once | RESPIRATORY_TRACT | Status: AC
  Administered 2011-07-19: 2 via RESPIRATORY_TRACT
  Filled 2011-07-19: qty 6.7

## 2011-07-19 MED ORDER — LORATADINE 10 MG PO TABS
10.0000 mg | ORAL_TABLET | Freq: Once | ORAL | Status: AC
  Administered 2011-07-19: 10 mg via ORAL
  Filled 2011-07-19: qty 1

## 2011-07-19 NOTE — Discharge Instructions (Signed)
Chest Wall Pain Chest wall pain is pain in or around the bones and muscles of your chest. It may take up to 6 weeks to get better. It may take longer if you must stay physically active in your work and activities.  CAUSES  Chest wall pain may happen on its own. However, it may be caused by:  A viral illness like the flu.   Injury.   Coughing.   Exercise.   Arthritis.   Fibromyalgia.   Shingles.  HOME CARE INSTRUCTIONS   Avoid overtiring physical activity. Try not to strain or perform activities that cause pain. This includes any activities using your chest or your abdominal and side muscles, especially if heavy weights are used.   Put ice on the sore area.   Put ice in a plastic bag.   Place a towel between your skin and the bag.   Leave the ice on for 15 to 20 minutes per hour while awake for the first 2 days.   Only take over-the-counter or prescription medicines for pain, discomfort, or fever as directed by your caregiver.  SEEK IMMEDIATE MEDICAL CARE IF:   Your pain increases, or you are very uncomfortable.   You have a fever.   Your chest pain becomes worse.   You have new, unexplained symptoms.   You have nausea or vomiting.   You feel sweaty or lightheaded.   You have a cough with phlegm (sputum), or you cough up blood.  MAKE SURE YOU:   Understand these instructions.   Will watch your condition.   Will get help right away if you are not doing well or get worse.  Document Released: 01/09/2005 Document Revised: 12/29/2010 Document Reviewed: 09/05/2010 [facility] Patient Information 2012 [place], [place].  Use over the counter vaginal yeast cream for the rash between your breasts twice daily for 10 days.  Do not stop before 10 days even when the rash improves to avoid reoccurrence.  Take over the counter tylenol as directed for for chest wall discomfort and use your albuterol inhaler as needed - no more than 2 puffs every 4 hours for coughing or wheezing.  If  you need to use your inhaler more than 4 times in one day, seek medical treatment.  Follow up with your primary care provider for long term allergy and asthma management.

## 2011-07-19 NOTE — MAU Provider Note (Signed)
History     CSN: [id number]  Arrival date and time: 07/19/11 4098   First Provider Initiated Contact with Patient 07/19/11 [phone]      Chief Complaint  Patient presents with  . Shortness of Breath   HPI  Jill Clarke 26 y.o. patient presents with a complaint of shortness of breath, wheezing, and chest/neck discomfort with "breathing".  Pt states she has had some frequent coughing, but has not used her inhaler in the past 48 hours.  Has not taken anything for discomfort.  States ate some tuna yesterday for the first time and felt like her "throat was closing", but did not treat it or take anything for it.    OB History    Grav Para Term Preterm Abortions TAB SAB Ect Mult Living   2 2 2  0 0 0 0 0 0 2      Past Medical History  Diagnosis Date  . Unspecified high-risk pregnancy 01/27/2011  . Sickle cell trait   . Asthma     uses inhaler prn  . Gestational diabetes     GDM; diet-controlled    Past Surgical History  Procedure Date  . Tonsillectomy     Family History  Problem Relation Age of Onset  . Sickle cell trait Mother   . Depression Mother   . Asthma Father   . Drug abuse Father     heroin abuse  . Kidney disease Father     due to heroin abuse  . Diabetes Maternal Grandmother     History  Substance Use Topics  . Smoking status: Never Smoker   . Smokeless tobacco: Never Used  . Alcohol Use: No    Allergies: No Known Allergies  Prescriptions prior to admission  Medication Sig Dispense Refill  . acetaminophen (TYLENOL) 325 MG tablet Take 650 mg by mouth once. For pain      . albuterol (PROVENTIL HFA;VENTOLIN HFA) 108 (90 BASE) MCG/ACT inhaler Inhale 2 puffs into the lungs every 6 (six) hours as needed for wheezing.  1 Inhaler  2  . Prenatal Vit-Fe Fumarate-FA (PRENATAL MULTIVITAMIN) TABS Take 1 tablet by mouth daily.  30 tablet  6    Review of Systems  Constitutional: Negative.   HENT: Positive for congestion, sore throat and neck pain.   Eyes:  Negative.   Respiratory: Positive for cough, shortness of breath and wheezing. Negative for hemoptysis and sputum production.   Gastrointestinal: Negative.   Genitourinary: Negative.   Musculoskeletal: Positive for back pain.       Reports pain and discomfort over sternum with breathing or movements.   Skin: Positive for rash.       Pt reports itchy peeling skin between her breasts.  Neurological: Positive for headaches. Negative for dizziness, tingling, tremors, sensory change, speech change, focal weakness, seizures and loss of consciousness.  Endo/Heme/Allergies: Negative.   Psychiatric/Behavioral: Negative.    Physical Exam   Blood pressure 115/81, pulse 90, temperature 98.4 F (36.9 C), temperature source Oral, resp. rate 18, height 5\' 1"  (1.549 m), weight 76.93 kg (169 lb 9.6 oz), last menstrual period 10/06/2010, SpO2 100.00%, unknown if currently breastfeeding.  Physical Exam  Constitutional: She is oriented to person, place, and time. She appears well-developed and well-nourished.  HENT:  Head: Normocephalic and atraumatic.  Mouth/Throat: Oropharynx is clear and moist. No oropharyngeal exudate.       Cobblestoning noted in pharynx with moderate erythema.  Nares patent with clear exudate noted.  Mild discomfort with palpation of  sinuses.  No swelling of tongue or oropharynx noted.  No reported or appreciable angioedema.  Neck: Normal range of motion. Neck supple. No tracheal deviation present. No thyromegaly present.       Large anterior cervical nodes present bilaterally.  Pt reports moderate discomfort with palpation.  Cardiovascular: Normal rate, regular rhythm, normal heart sounds and intact distal pulses.  Exam reveals no gallop and no friction rub.   No murmur heard. Respiratory: Effort normal and breath sounds normal. No stridor. No respiratory distress. She has no wheezes. She has no rales. She exhibits tenderness.       Pt sleeping in a recumbent position upon entering  the room and very drowsy. No audible wheezing noted.  Sats on RA, 99-100%.  Lungs clear to auscultation throughout with good air movement in all lobes.  Patient able to recline and move with no respiratory distress.  Reports chest discomfort with deep breaths and movement over sternum, shoulders and back.    Some coughing noted with thin clear productive sputum, patient states it is similar to her nasal drainage.  GI: Soft. Bowel sounds are normal.  Musculoskeletal: Normal range of motion. She exhibits edema. She exhibits no tenderness.       MS intact. Negative Homans.  LEs equal in size with no appreciable swelling or tenderness.  Lymphadenopathy:    She has cervical adenopathy.  Neurological: She is alert and oriented to person, place, and time. She exhibits normal muscle tone. Coordination normal.  Skin: Skin is warm and dry. Rash noted. No erythema.       Patient has a fungal infection noted between her breasts.  Psychiatric: She has a normal mood and affect. Her behavior is normal.    MAU Course  Procedures  MDM  Tylenol 650mg  for discomfort. Claritin 10mg  for allergies. 2 puffs of albuterol inhaler for bronchial coughing.  Patient instructed on correct use of albuterol inhaler, how often to use and when to return.  Assessment and Plan   Assessment: Allergic Rhinitis Chest Wall Pain Cutaneous Yeast   Plan:  OTC metronidazole cream for rash between breasts.  Use twice daily for 10 days.  Start taking zyrtec 10mg  PO at night for allergies.  Tylenol 650mg  every 4 hours as needed for discomfort.  Return as needed for SOB or resp distress.  Servando Salina 07/19/2011, 8:37 AM   I saw client and agree with exam and documentation Nolene Bernheim, RN, FNP

## 2011-07-19 NOTE — MAU Note (Signed)
Pt reports she started coughing this morning and worried about her asthma feels her chest is tight when she takes in a deep breath.. Also reporting a rash under her breast. Pt is Post Partum x 1 week.

## 2011-08-10 ENCOUNTER — Ambulatory Visit: Payer: Medicaid Other | Admitting: Obstetrics & Gynecology

## 2011-09-08 ENCOUNTER — Encounter: Payer: Self-pay | Admitting: Advanced Practice Midwife

## 2011-09-08 ENCOUNTER — Ambulatory Visit (INDEPENDENT_AMBULATORY_CARE_PROVIDER_SITE_OTHER): Payer: Medicaid Other | Admitting: Advanced Practice Midwife

## 2011-09-08 NOTE — Patient Instructions (Addendum)
Etonogestrel implant What is this medicine? ETONOGESTREL is a contraceptive (birth control) device. It is used to prevent pregnancy. It can be used for up to 3 years. This medicine may be used for other purposes; ask your health care provider or pharmacist if you have questions. What should I tell my health care provider before I take this medicine? They need to know if you have any of these conditions: -abnormal vaginal bleeding -blood vessel disease or blood clots -cancer of the breast, cervix, or liver -depression -diabetes -gallbladder disease -headaches -heart disease or recent heart attack -high blood pressure -high cholesterol -kidney disease -liver disease -renal disease -seizures -tobacco smoker -an unusual or allergic reaction to etonogestrel, other hormones, anesthetics or antiseptics, medicines, foods, dyes, or preservatives -pregnant or trying to get pregnant -breast-feeding How should I use this medicine? This device is inserted just under the skin on the inner side of your upper arm by a health care professional. Talk to your pediatrician regarding the use of this medicine in children. Special care may be needed. Overdosage: If you think you've taken too much of this medicine contact a poison control center or emergency room at once. Overdosage: If you think you have taken too much of this medicine contact a poison control center or emergency room at once. NOTE: This medicine is only for you. Do not share this medicine with others. What if I miss a dose? This does not apply. What may interact with this medicine? Do not take this medicine with any of the following medications: -amprenavir -bosentan -fosamprenavir This medicine may also interact with the following medications: -barbiturate medicines for inducing sleep or treating seizures -certain medicines for fungal infections like ketoconazole and itraconazole -griseofulvin -medicines to treat seizures like  carbamazepine, felbamate, oxcarbazepine, phenytoin, topiramate -modafinil -phenylbutazone -rifampin -some medicines to treat HIV infection like atazanavir, indinavir, lopinavir, nelfinavir, tipranavir, ritonavir -St. John's wort This list may not describe all possible interactions. Give your health care provider a list of all the medicines, herbs, non-prescription drugs, or dietary supplements you use. Also tell them if you smoke, drink alcohol, or use illegal drugs. Some items may interact with your medicine. What should I watch for while using this medicine? This product does not protect you against HIV infection (AIDS) or other sexually transmitted diseases. You should be able to feel the implant by pressing your fingertips over the skin where it was inserted. Tell your doctor if you cannot feel the implant. What side effects may I notice from receiving this medicine? Side effects that you should report to your doctor or health care professional as soon as possible: -allergic reactions like skin rash, itching or hives, swelling of the face, lips, or tongue -breast lumps -changes in vision -confusion, trouble speaking or understanding -dark urine -depressed mood -general ill feeling or flu-like symptoms -light-colored stools -loss of appetite, nausea -right upper belly pain -severe headaches -severe pain, swelling, or tenderness in the abdomen -shortness of breath, chest pain, swelling in a leg -signs of pregnancy -sudden numbness or weakness of the face, arm or leg -trouble walking, dizziness, loss of balance or coordination -unusual vaginal bleeding, discharge -unusually weak or tired -yellowing of the eyes or skin Side effects that usually do not require medical attention (Report these to your doctor or health care professional if they continue or are bothersome.): -acne -breast pain -changes in weight -cough -fever or chills -headache -irregular menstrual  bleeding -itching, burning, and vaginal discharge -pain or difficulty passing urine -sore throat This   list may not describe all possible side effects. Call your doctor for medical advice about side effects. You may report side effects to FDA at 1-800-FDA-1088. Where should I keep my medicine? This drug is given in a hospital or clinic and will not be stored at home. NOTE: This sheet is a summary. It may not cover all possible information. If you have questions about this medicine, talk to your doctor, pharmacist, or health care provider.  2012, [employer] Standard. (10/02/2008 3:54:17 PM) 

## 2011-09-08 NOTE — Progress Notes (Signed)
Patient ID: Jill Clarke, female   DOB: Jun 21, 1985, 26 y.o.   MRN: [id number] Subjective:     Jill Clarke is a 26 y.o. female who presents for a postpartum visit. She is 7 weeks postpartum following a spontaneous vaginal delivery. I have fully reviewed the prenatal and intrapartum course. The delivery was at [redacted]w[redacted]d gestational weeks. Outcome: spontaneous vaginal delivery. Anesthesia: none. Postpartum course has been uncomplicated. Baby's course has been uncomplicated. Baby is feeding by bottle. Bleeding has had a normal period. Bowel function is normal. Bladder function is normal. Patient is not sexually active. Contraception method is none and considering Nexplanon, Abstence for now. Postpartum depression screening: negative.  The following portions of the patient's history were reviewed and updated as appropriate: allergies, current medications, past family history, past medical history, past social history, past surgical history and problem list.  Review of Systems Pertinent items are noted in HPI.   Objective:    BP 123/85  Pulse 88  Temp 97.6 F (36.4 C) (Oral)  Ht 5\' 1"  (1.549 m)  Wt 166 lb (75.297 kg)  BMI 31.37 kg/m2  LMP 09/01/2011  Breastfeeding? No  General:  alert, cooperative and appears stated age  Abdomen: soft, non-tender; bowel sounds normal; no masses,  no organomegaly   Vulva:  normal  Vagina: not evaluated  Cervix:  no cervical motion tenderness  Corpus: normal and normal size, contour, position, consistency, mobility, non-tender  Adnexa:  normal adnexa  Rectal Exam: Visually normal        Assessment:    Normal postpartum exam. Pap smear not done at today's visit.   Plan:    1. Contraception: abstinence and considering Nexplanon - information provided 2.  3. Follow up for Nexplanon Insertion or as needed.   Seen and examined by me also. Agree with note.  Wynelle Bourgeois CNM

## 2011-09-23 ENCOUNTER — Encounter (HOSPITAL_COMMUNITY): Payer: Self-pay | Admitting: Emergency Medicine

## 2011-09-23 ENCOUNTER — Emergency Department (HOSPITAL_COMMUNITY)
Admission: EM | Admit: 2011-09-23 | Discharge: 2011-09-23 | Disposition: A | Discharge status: Home / Self Care | Payer: Medicaid Other | Attending: Emergency Medicine | Admitting: Emergency Medicine

## 2011-09-23 ENCOUNTER — Emergency Department (HOSPITAL_COMMUNITY): Payer: Medicaid Other

## 2011-09-23 DIAGNOSIS — R209 Unspecified disturbances of skin sensation: Secondary | ICD-10-CM | POA: Insufficient documentation

## 2011-09-23 DIAGNOSIS — J45909 Unspecified asthma, uncomplicated: Secondary | ICD-10-CM | POA: Insufficient documentation

## 2011-09-23 DIAGNOSIS — D573 Sickle-cell trait: Secondary | ICD-10-CM | POA: Insufficient documentation

## 2011-09-23 DIAGNOSIS — R0789 Other chest pain: Secondary | ICD-10-CM

## 2011-09-23 DIAGNOSIS — R55 Syncope and collapse: Secondary | ICD-10-CM | POA: Insufficient documentation

## 2011-09-23 DIAGNOSIS — M79609 Pain in unspecified limb: Secondary | ICD-10-CM | POA: Insufficient documentation

## 2011-09-23 DIAGNOSIS — R079 Chest pain, unspecified: Secondary | ICD-10-CM | POA: Insufficient documentation

## 2011-09-23 LAB — GLUCOSE, CAPILLARY: Glucose-Capillary: 81 mg/dL (ref 70–99)

## 2011-09-23 LAB — URINALYSIS, ROUTINE W REFLEX MICROSCOPIC
Glucose, UA: NEGATIVE mg/dL
Hgb urine dipstick: NEGATIVE
Protein, ur: NEGATIVE mg/dL
Specific Gravity, Urine: 1.003 — ABNORMAL LOW (ref 1.005–1.030)
pH: 6.5 (ref 5.0–8.0)

## 2011-09-23 LAB — POCT PREGNANCY, URINE: Preg Test, Ur: NEGATIVE

## 2011-09-23 LAB — D-DIMER, QUANTITATIVE: D-Dimer, Quant: <0.22 ug/mL-FEU (ref 0.00–0.48)

## 2011-09-23 IMAGING — CR DG CHEST 2V
2 series · 2 of 2 positions shown · non-contrast
Comparison: [DATE]

CLINICAL DATA: Left-sided chest pain, shortness of breath and
cough.

CHEST - 2 VIEW

[w chest pa]
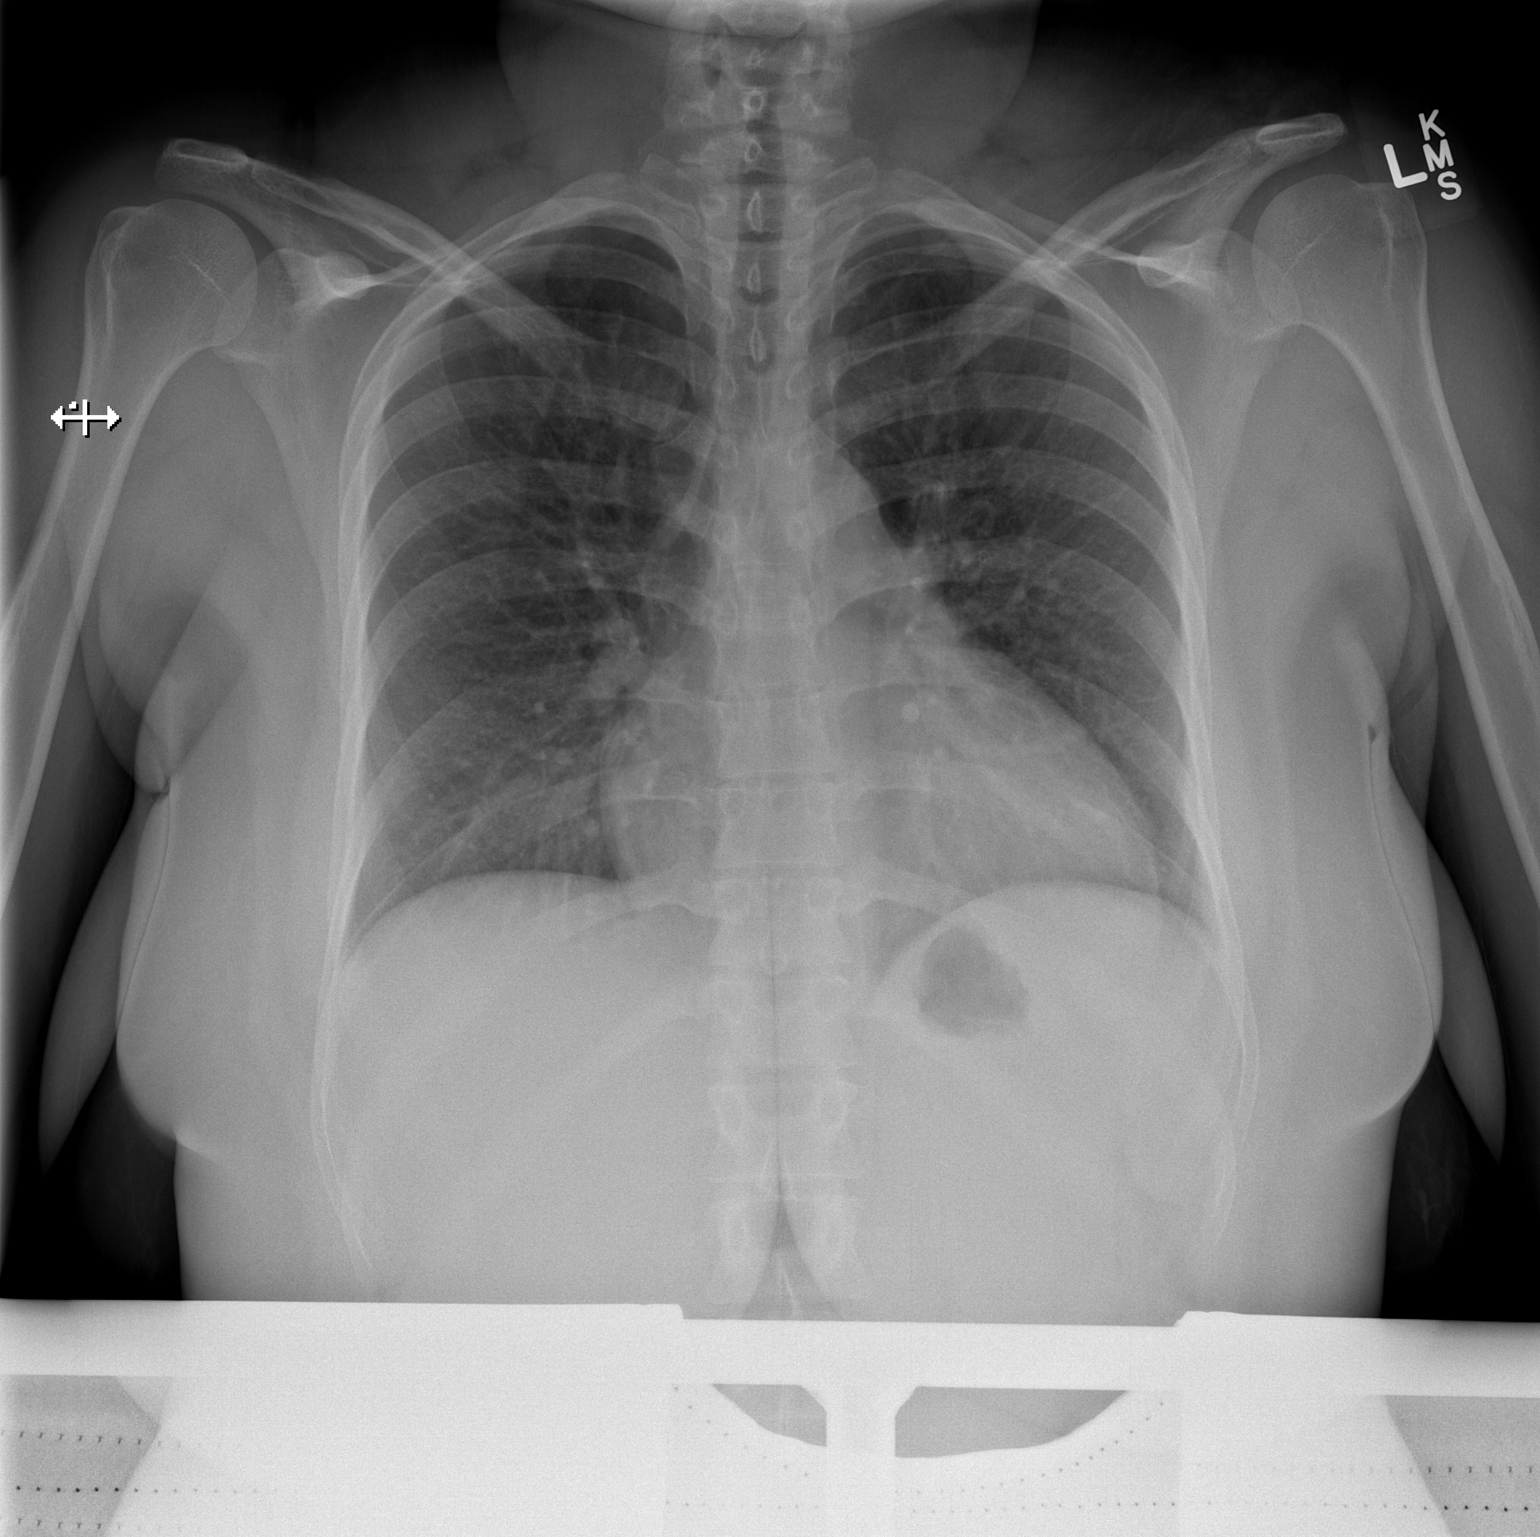

[w chest lat]
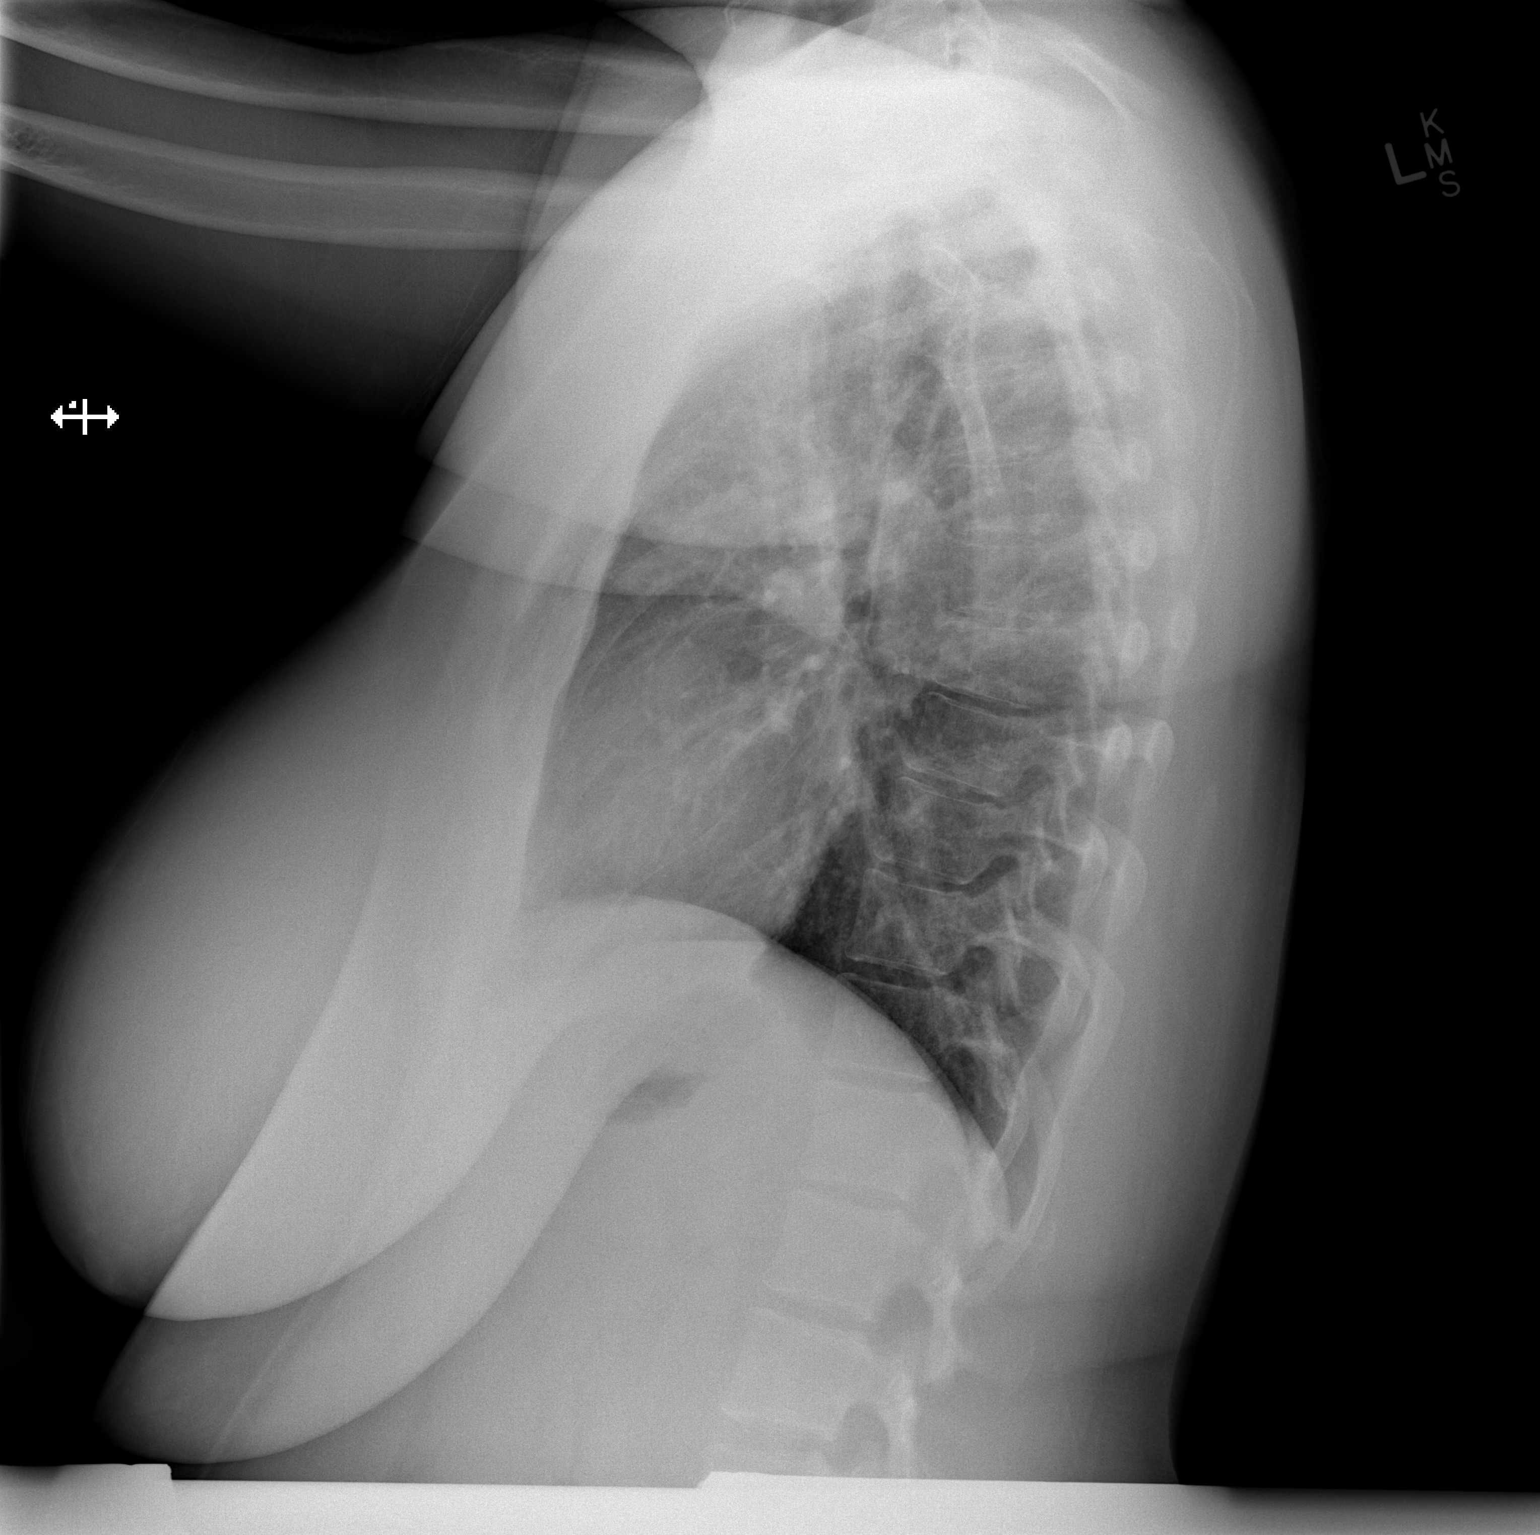

[2 of 2 positions shown; findings below may reference images not displayed]

FINDINGS: The heart size and mediastinal contours are within normal
limits.  Both lungs are clear.  The visualized skeletal structures
are unremarkable.
IMPRESSION: No active disease.

## 2011-09-23 NOTE — ED Notes (Signed)
Pt verbalizes understanding 

## 2011-09-23 NOTE — ED Provider Notes (Addendum)
History     CSN: [id number]  Arrival date & time 09/23/11  1631   First MD Initiated Contact with Patient 09/23/11 1843      Chief Complaint  Patient presents with  . Leg Pain  . Weakness    (Consider location/radiation/quality/duration/timing/severity/associated sxs/prior treatment) HPI Comments: Ms. Mettler presents for evaluation of "almost fainting".  She reports she was sitting in a chair at home doing school work when she felt acutely hot and flushed.  She felt a strong pressure in her left leg and arm and some chest tightness.  She reported feeling numb all over her body and a sensation as if she would pass out.  She denies unilateral weakness, palpitations, or overt chest pain.  She states she had shortness of breath also.  She was able to drink some water and the symptoms improved.  Patient is a 26 y.o. female presenting with leg pain and weakness. The history is provided by the patient. No language interpreter was used.  Leg Pain  The incident occurred 1 to 2 hours ago. The incident occurred at home. There was no injury mechanism. The pain is present in the left leg. The quality of the pain is described as throbbing. The pain is at a severity of 5/10. The pain is moderate. The pain has been improving since onset. Associated symptoms include numbness and tingling. Pertinent negatives include no inability to bear weight, no loss of motion, no muscle weakness and no loss of sensation. She has tried nothing for the symptoms.  Weakness Primary symptoms do not include headaches, seizures, dizziness, loss of sensation, fever, nausea or vomiting.  Additional symptoms include weakness and leg pain. Additional symptoms do not include neck stiffness, photophobia or tinnitus.    Past Medical History  Diagnosis Date  . Unspecified high-risk pregnancy 01/27/2011  . Sickle cell trait   . Asthma     uses inhaler prn  . Gestational diabetes     GDM; diet-controlled    Past Surgical History    Procedure Date  . Tonsillectomy     Family History  Problem Relation Age of Onset  . Sickle cell trait Mother   . Depression Mother   . Asthma Father   . Drug abuse Father     heroin abuse  . Kidney disease Father     due to heroin abuse  . Diabetes Maternal Grandmother     History  Substance Use Topics  . Smoking status: Never Smoker   . Smokeless tobacco: Never Used  . Alcohol Use: No    OB History    Grav Para Term Preterm Abortions TAB SAB Ect Mult Living   2 2 2  0 0 0 0 0 0 2      Review of Systems  Constitutional: Negative for fever, chills, diaphoresis, activity change, appetite change and fatigue.  HENT: Negative for congestion, sore throat, facial swelling, rhinorrhea, drooling, trouble swallowing, neck pain, neck stiffness, voice change and tinnitus.   Eyes: Negative for photophobia, pain, discharge, itching and visual disturbance.  Respiratory: Positive for chest tightness and shortness of breath. Negative for apnea, cough, choking and stridor.   Cardiovascular: Negative for chest pain, palpitations and leg swelling.  Gastrointestinal: Negative for nausea, vomiting, abdominal pain, diarrhea and abdominal distention.  Genitourinary: Negative.   Musculoskeletal: Positive for myalgias. Negative for back pain, joint swelling, arthralgias and gait problem.  Skin: Negative for color change, pallor, rash and wound.  Neurological: Positive for tingling, tremors, weakness, light-headedness and numbness.  Negative for dizziness, seizures, syncope, facial asymmetry, speech difficulty and headaches.  Hematological: Negative.   Psychiatric/Behavioral: Negative.     Allergies  Review of patient's allergies indicates no known allergies.  Home Medications   Current Outpatient Rx  Name Route Sig Dispense Refill  . ACETAMINOPHEN 500 MG PO TABS Oral Take 500 mg by mouth every 6 (six) hours as needed. For pain.    . ALBUTEROL SULFATE HFA 108 (90 BASE) MCG/ACT IN AERS  Inhalation Inhale 2 puffs into the lungs every 6 (six) hours as needed for wheezing. 1 Inhaler 2  . CALCIUM CARBONATE ANTACID 500 MG PO CHEW Oral Chew 2 tablets by mouth 3 (three) times daily as needed. For indigestion.    . IBUPROFEN 600 MG PO TABS Oral Take 600 mg by mouth every 8 (eight) hours as needed. For pain.    Marland Kitchen PRENATAL MULTIVITAMIN CH Oral Take 1 tablet by mouth daily. 30 tablet 6    BP 116/74  Pulse 74  Temp 98.2 F (36.8 C) (Oral)  Resp 18  SpO2 100%  LMP 09/01/2011  Breastfeeding? No  Physical Exam  Nursing note and vitals reviewed. Constitutional: She is oriented to person, place, and time. She appears well-developed and well-nourished.  Non-toxic appearance. She does not have a sickly appearance. She does not appear ill. No distress.  HENT:  Head: Normocephalic and atraumatic.  Right Ear: External ear normal.  Left Ear: External ear normal.  Nose: Nose normal.  Mouth/Throat: Uvula is midline, oropharynx is clear and moist and mucous membranes are normal. Mucous membranes are not pale, not dry and not cyanotic. No oropharyngeal exudate, posterior oropharyngeal edema, posterior oropharyngeal erythema or tonsillar abscesses.  Eyes: Conjunctivae and EOM are normal. Pupils are equal, round, and reactive to light. Right eye exhibits no discharge. Left eye exhibits no discharge. No scleral icterus.  Neck: Normal range of motion. Normal carotid pulses and no JVD present. No tracheal tenderness, no spinous process tenderness and no muscular tenderness present. Carotid bruit is not present. No rigidity. No tracheal deviation, no edema, no erythema and normal range of motion present. No mass and no thyromegaly present.  Cardiovascular: Normal rate, regular rhythm, S1 normal, S2 normal, normal heart sounds and intact distal pulses.  PMI is not displaced.  Exam reveals no gallop, no S3, no S4, no friction rub and no decreased pulses.   No murmur heard. Pulmonary/Chest: Effort normal  and breath sounds normal. No stridor. No respiratory distress. She has no wheezes. She has no rales. She exhibits no tenderness.  Abdominal: Soft. Bowel sounds are normal. She exhibits no distension and no mass. There is no tenderness. There is no rebound and no guarding.  Musculoskeletal: Normal range of motion. She exhibits no edema and no tenderness.  Lymphadenopathy:    She has no cervical adenopathy.  Neurological: She is alert and oriented to person, place, and time. She displays no atrophy, no tremor and normal reflexes. No cranial nerve deficit or sensory deficit. She exhibits normal muscle tone. She displays no seizure activity. Coordination normal. GCS eye subscore is 4. GCS verbal subscore is 5. GCS motor subscore is 6. She displays no Babinski's sign on the right side. She displays no Babinski's sign on the left side.  Skin: Skin is warm and dry. No rash noted. She is not diaphoretic. No erythema. No pallor.  Psychiatric: She has a normal mood and affect. Her behavior is normal. Judgment and thought content normal.    ED Course  Procedures (  including critical care time)  Labs Reviewed  URINALYSIS, ROUTINE W REFLEX MICROSCOPIC - Abnormal; Notable for the following:    Specific Gravity, Urine 1.003 (*)     All other components within normal limits  POCT PREGNANCY, URINE  GLUCOSE, CAPILLARY  D-DIMER, QUANTITATIVE   No results found.   No diagnosis found.   Date: 09/23/2011  Rate: 71 bpm  Rhythm: normal sinus rhythm  QRS Axis: normal  Intervals: normal  ST/T Wave abnormalities: normal  Conduction Disutrbances:none  Narrative Interpretation:   Old EKG Reviewed: none available      MDM  Pt presents for evaluation s/p a near syncopal event.  She reported also feeling left-sided numbness and pain but denies recent travel, oral contraceptive (depo, IUD, or nuva-ring) use, or leg swelling.  She has stable vital signs, appears nontoxic, has no focal deficits on exam - NAD.   Plan initiated and age appropriate evaluation for near syncope and chest discomfort including CXR, upreg, orthostatic VS, ECG, and ddimer.  Will review results as available and provide appropriate disposition.  2120.  Pt stable, NAD.  Awaiting completion of labs.  Anticipate D/C home.  2230.  Pt stable, NAD.  Exam repeated.  No focal neuro deficits.  CXR neg.  Ddimer nl.  No evidence of acute ischemia on EKG.  Pt denies risk factors for early CAD.  She has reproducible left lateral chest wall tenderness.  Plan D/C home, will encourage outpt f/u.       Tobin Chad, MD 09/23/11 2127  Tobin Chad, MD 09/23/11 2233

## 2011-09-23 NOTE — ED Notes (Signed)
Pt alert and oriented x4. Respirations even and unlabored, bilateral symmetrical rise and fall of chest. Skin warm and dry. In no acute distress. Denies needs.   

## 2011-09-23 NOTE — ED Notes (Signed)
Pt presents w/ left lower extremity edema which she indicates has subsided, continues to have left leg pain and weakness. States she started drinking water and it made her feel better. Endorses GDM

## 2011-09-23 NOTE — ED Notes (Signed)
Pt. Given [facility] ED black cellular phone to check voicemail of her phone. Phone number is [phone]

## 2011-09-26 ENCOUNTER — Emergency Department (HOSPITAL_COMMUNITY)
Admission: EM | Admit: 2011-09-26 | Discharge: 2011-09-26 | Disposition: A | Discharge status: Home / Self Care | Payer: Medicaid Other | Attending: Emergency Medicine | Admitting: Emergency Medicine

## 2011-09-26 ENCOUNTER — Encounter (HOSPITAL_COMMUNITY): Payer: Self-pay | Admitting: Emergency Medicine

## 2011-09-26 DIAGNOSIS — S86919A Strain of unspecified muscle(s) and tendon(s) at lower leg level, unspecified leg, initial encounter: Secondary | ICD-10-CM

## 2011-09-26 DIAGNOSIS — J45909 Unspecified asthma, uncomplicated: Secondary | ICD-10-CM | POA: Insufficient documentation

## 2011-09-26 DIAGNOSIS — M79609 Pain in unspecified limb: Secondary | ICD-10-CM | POA: Insufficient documentation

## 2011-09-26 DIAGNOSIS — D573 Sickle-cell trait: Secondary | ICD-10-CM | POA: Insufficient documentation

## 2011-09-26 MED ORDER — HYDROCODONE-ACETAMINOPHEN 5-325 MG PO TABS: ORAL_TABLET | ORAL | Status: AC

## 2011-09-26 MED ORDER — NAPROXEN 250 MG PO TABS: 250.0000 mg | ORAL_TABLET | Freq: Two times a day (BID) | ORAL | Status: DC

## 2011-09-26 NOTE — ED Provider Notes (Signed)
History    This chart was scribed for Laray Anger, DO, MD by Smitty Pluck. The patient was seen in room TR08C and the patient's care was started at 11:53AM.   CSN: [id number]  Arrival date & time 09/26/11  1004   First MD Initiated Contact with Patient 09/26/11 1153      Chief Complaint  Patient presents with  . Leg Pain     HPI Jill Clarke is a 26 y.o. female who presents to the Emergency Department complaining of gradual onset and persistence of constant anterior left lower leg "pain" onset 3 weeks ago. Pt reports that pain starts under her left knee and radiates down her shin to left foot. Describes the pain as "throbbing," "pressure," and "numbness" when she bears weight and walks.  Denies any injury, no LE joints pain, no focal motor weakness, no tingling/numbness in extremity, no back pain, no rash, no fevers, no swelling.    Past Medical History  Diagnosis Date  . Unspecified high-risk pregnancy 01/27/2011  . Sickle cell trait   . Asthma     uses inhaler prn  . Gestational diabetes     GDM; diet-controlled    Past Surgical History  Procedure Date  . Tonsillectomy     Family History  Problem Relation Age of Onset  . Sickle cell trait Mother   . Depression Mother   . Asthma Father   . Drug abuse Father     heroin abuse  . Kidney disease Father     due to heroin abuse  . Diabetes Maternal Grandmother     History  Substance Use Topics  . Smoking status: Never Smoker   . Smokeless tobacco: Never Used  . Alcohol Use: No    OB History    Grav Para Term Preterm Abortions TAB SAB Ect Mult Living   2 2 2  0 0 0 0 0 0 2      Review of Systems ROS: Statement: All systems negative except as marked or noted in the HPI; Constitutional: Negative for fever and chills. ; ; Eyes: Negative for eye pain, redness and discharge. ; ; ENMT: Negative for ear pain, hoarseness, nasal congestion, sinus pressure and sore throat. ; ; Cardiovascular: Negative for chest  pain, palpitations, diaphoresis, dyspnea and peripheral edema. ; ; Respiratory: Negative for cough, wheezing and stridor. ; ; Gastrointestinal: Negative for nausea, vomiting, diarrhea, abdominal pain, blood in stool, hematemesis, jaundice and rectal bleeding. . ; ; Genitourinary: Negative for dysuria, flank pain and hematuria. ; ; Musculoskeletal: +left anterior tibial pain. Negative for back pain and neck pain. Negative for swelling and trauma.; ; Skin: Negative for pruritus, rash, abrasions, blisters, bruising and skin lesion.; ; Neuro: Negative for headache, lightheadedness and neck stiffness. Negative for weakness, altered level of consciousness , altered mental status, extremity weakness, paresthesias, involuntary movement, seizure and syncope.       Allergies  Review of patient's allergies indicates no known allergies.  Home Medications   Current Outpatient Rx  Name Route Sig Dispense Refill  . ALBUTEROL SULFATE HFA 108 (90 BASE) MCG/ACT IN AERS Inhalation Inhale 2 puffs into the lungs every 6 (six) hours as needed for wheezing. 1 Inhaler 2  . CALCIUM CARBONATE ANTACID 500 MG PO CHEW Oral Chew 2 tablets by mouth 3 (three) times daily as needed. For indigestion.    . IBUPROFEN 600 MG PO TABS Oral Take 600 mg by mouth every 6 (six) hours as needed. For pain.    Marland Kitchen  PRENATAL MULTIVITAMIN CH Oral Take 1 tablet by mouth daily. 30 tablet 6    BP 117/76  Pulse 88  Temp 98.5 F (36.9 C)  Resp 18  SpO2 98%  LMP 09/01/2011  Physical Exam 1155: Physical examination:  Nursing notes reviewed; Vital signs and O2 SAT reviewed;  Constitutional: Well developed, Well nourished, Well hydrated, In no acute distress; Head:  Normocephalic, atraumatic; Eyes: EOMI, PERRL, No scleral icterus; ENMT: Mouth and pharynx normal, Mucous membranes moist; Neck: Supple, Full range of motion, No lymphadenopathy; Cardiovascular: Regular rate and rhythm, No murmur, rub, or gallop; Respiratory: Breath sounds clear & equal  bilaterally, No rales, rhonchi, wheezes.  Speaking full sentences with ease, Normal respiratory effort/excursion; Chest: Nontender, Movement normal;; Extremities: Pulses normal, +anterior left tibia tender to palp.  No deformity.  LE muscle compartments soft. No edema, no erythema, no ecchymosis. NT left knee/ankle/foot. No calf tenderness, edema or asymmetry.; Neuro: AA&Ox3, Major CN grossly intact.  Speech clear. No gross focal motor or sensory deficits in extremities, climbs on and off stretcher by herself without difficulty or distress, gait steady.; Skin: Color normal, Warm, Dry.   ED Course  Procedures   MDM  MDM Reviewed: nursing note, vitals and previous chart Reviewed previous: labs      1225:  Appears msk pain at this time, will tx symptomatically.  TTP anterior shin, D-dimer negative 4 days ago, and low risk Wells' criteria; therefore doubt DVT as cause for pain.  No signs of compartment syndrome as would expect to see after 3 weeks of constant pain.  Doubt fracture without hx of trauma.  Dx d/w pt.  Questions answered.  Verb understanding, agreeable to d/c home with outpt f/u.     I personally performed the services described in this documentation, which was scribed in my presence. The recorded information has been reviewed and considered. Anakaren Campion Allison Quarry, DO 09/28/11 1722

## 2011-09-26 NOTE — ED Notes (Signed)
Patient presents to ed c/o pain in her left knee down into her foot. Onset 3 weeks ago worse last 2 days. States she was seen at [facility] 2 days ago with chest pain had xrays.  and lab work all of which she states was normal. Patient pain is  Worse with pressure and weight bearing. No history of injury.

## 2011-09-26 NOTE — ED Notes (Signed)
Pt c/o left leg pain x several weeks into foot; pt denies injury

## 2011-10-02 ENCOUNTER — Ambulatory Visit (INDEPENDENT_AMBULATORY_CARE_PROVIDER_SITE_OTHER): Payer: Medicaid Other | Admitting: Obstetrics & Gynecology

## 2011-10-02 VITALS — BP 121/79 | HR 90 | Temp 98.8°F | Ht 63.0 in | Wt 167.1 lb

## 2011-10-02 DIAGNOSIS — Z30017 Encounter for initial prescription of implantable subdermal contraceptive: Secondary | ICD-10-CM

## 2011-10-02 DIAGNOSIS — Z01812 Encounter for preprocedural laboratory examination: Secondary | ICD-10-CM

## 2011-10-02 DIAGNOSIS — Z309 Encounter for contraceptive management, unspecified: Secondary | ICD-10-CM

## 2011-10-02 DIAGNOSIS — Z411 Encounter for cosmetic surgery: Secondary | ICD-10-CM | POA: Insufficient documentation

## 2011-10-02 LAB — POCT PREGNANCY, URINE: Preg Test, Ur: NEGATIVE

## 2011-10-02 MED ORDER — ETONOGESTREL 68 MG ~~LOC~~ IMPL
68.0000 mg | DRUG_IMPLANT | Freq: Once | SUBCUTANEOUS | Status: AC
  Administered 2011-10-02: 68 mg via SUBCUTANEOUS

## 2011-10-02 NOTE — Addendum Note (Signed)
Addended by: Adam Phenix on: 10/02/2011 03:51 PM   Modules accepted: Level of Service

## 2011-10-02 NOTE — Patient Instructions (Signed)
Contraceptive Implant Information A contraceptive implant is a plastic rod that is inserted under the skin. It is usually inserted under the skin of your upper arm. It continually releases small amounts of progestin (synthetic progesterone) into the bloodstream. This prevents an egg from being released from the ovary. It also thickens the cervical mucus to prevent sperm from entering the cervix, and it thins the uterine lining to prevent a fertilized egg from attaching to the uterus. They can be effective for up to 3 years. Implants do not provide protection against sexually transmitted diseases (STDs).  The procedure to insert an implant usually takes about 10 minutes. There may be minor bruising, swelling, and discomfort at the insertion site for a couple days. The implant begins to work within the first day. Other contraceptive protection should be used for 2 weeks. Follow up with your caregiver to get rechecked as directed. Your caregiver will make sure you are a good candidate for the contraceptive implant. Discuss with your caregiver the possible side effects of the implant ADVANTAGES  It prevents pregnancy for up to 3 years.   It is easily reversible.   It is convenient.   The progestins may protect against uterine and ovarian cancer.   It can be used when breastfeeding.   It can be used by women who cannot take estrogen.  DISADVANTAGES  You may have irregular or unplanned vaginal bleeding.   You may develop side effects, including headache, weight gain, acne, breast tenderness, or mood changes.   You may have tissue or nerve damage after insertion (rare).   It may be difficult and uncomfortable to remove.   Certain medications may interfere with the effectiveness of the implants.  REMOVAL OF IMPLANT The implant should be removed in 3 years or as directed by your caregiver. The implants effect wears off in a few hours after removal. Your ability to get pregnant (fertility) is  restored within a couple of weeks. New implants can be inserted as soon as the old ones are removed if desired. DO NOT GET THE IMPLANT IF:   You are pregnant.   You have a history of breast cancer, osteoporosis, blood clots, heart disease, diabetes, high blood pressure, liver disease, tumors, or stroke.    You have undiagnosed vaginal bleeding.   You have overly sensitive to certain parts of the implant.  Document Released: 12/29/2010 Document Reviewed: 12/27/2010 [employer] Patient Information 2012 [employer]. 

## 2011-10-02 NOTE — Progress Notes (Signed)
Patient ID: Jill Clarke, female   DOB: 1985/02/19, 26 y.o.   MRN: [id number] Patient's last menstrual period was 09/01/2011. Patient has used Depo-Provera and oral contraceptives in the past. She has a 72-month-old child. She wishes to have Nexplanon placed. We discussed the risk of the insertion, the length of effectiveness of the method, risk of blood clots, risk of irregular bleeding, and pain at the insertion site. Her questions were answered.  Patient given informed consent, signed copy in the chart, time out was performed. Pregnancy test was negative. Appropriate time out taken.  Patient's right arm was prepped and draped in the usual sterile fashion.. The ruler used to measure and mark insertion area.  Pt was prepped with Betadine swab and then injected with 10 cc of 1% lidocaine with epinephrine.  Pt was prepped with betadine, Implanon removed form packaging,  Device confirmed in needle, then inserted full length of needle and withdrawn per handbook instructions.  Pt insertion site covered with 4x4 gauze pad.   Minimal blood loss.  Pt tolerated the procedure well.    She was instructed to remove the dressing tomorrow. She should adjust the dressing it too tight. She should report any increased pain redness or swelling. She plans to use barrier method for the next week.  Dr. Scheryl Darter 10/02/2011 3:50 PM

## 2011-10-21 ENCOUNTER — Emergency Department (HOSPITAL_COMMUNITY)
Admission: EM | Admit: 2011-10-21 | Discharge: 2011-10-21 | Disposition: A | Discharge status: Home / Self Care | Payer: Medicaid Other

## 2011-10-21 ENCOUNTER — Emergency Department (HOSPITAL_COMMUNITY)
Admission: EM | Admit: 2011-10-21 | Discharge: 2011-10-21 | Disposition: A | Discharge status: Home / Self Care | Payer: Medicaid Other | Source: Home / Self Care | Attending: Family Medicine | Admitting: Family Medicine

## 2011-10-21 ENCOUNTER — Encounter (HOSPITAL_COMMUNITY): Payer: Self-pay | Admitting: Emergency Medicine

## 2011-10-21 DIAGNOSIS — R209 Unspecified disturbances of skin sensation: Secondary | ICD-10-CM

## 2011-10-21 MED ORDER — CYCLOBENZAPRINE HCL 5 MG PO TABS: 5.0000 mg | ORAL_TABLET | Freq: Two times a day (BID) | ORAL | Status: DC | PRN

## 2011-10-21 NOTE — ED Notes (Signed)
Pt c/o pain on her left side of body x1 month... Has been to [facility] ED and [place] for this problem w/in the past 2 months... Sx include: Headache on left side, left ear pain, Left shoulder tightness, left arm and hand numbness, left leg numbness, chest discomfort... She denies: fever, vomiting, nausea, diarrhea.

## 2011-10-21 NOTE — ED Provider Notes (Signed)
History     CSN: [id number]  Arrival date & time 10/21/11  1508   First MD Initiated Contact with Patient 10/21/11 1530      Chief Complaint  Patient presents with  . Generalized Body Aches    (Consider location/radiation/quality/duration/timing/severity/associated sxs/prior treatment) Patient is a 26 y.o. female presenting with neurologic complaint. The history is provided by the patient.  Neurologic Problem The primary symptoms include dizziness, paresthesias, focal weakness and loss of sensation. Primary symptoms do not include fever, nausea or vomiting. The symptoms began more than 1 week ago (1-2 mo of sx. seen at [facility] and [facility] ED for eval, without dx.). The symptoms are unchanged.  Dizziness also occurs with weakness. Dizziness does not occur with nausea or vomiting.  Additional symptoms include neck stiffness, weakness, pain and leg pain.    Past Medical History  Diagnosis Date  . Unspecified high-risk pregnancy 01/27/2011  . Sickle cell trait   . Asthma     uses inhaler prn  . Gestational diabetes     GDM; diet-controlled    Past Surgical History  Procedure Date  . Tonsillectomy     Family History  Problem Relation Age of Onset  . Sickle cell trait Mother   . Depression Mother   . Asthma Father   . Drug abuse Father     heroin abuse  . Kidney disease Father     due to heroin abuse  . Diabetes Maternal Grandmother     History  Substance Use Topics  . Smoking status: Never Smoker   . Smokeless tobacco: Never Used  . Alcohol Use: No    OB History    Grav Para Term Preterm Abortions TAB SAB Ect Mult Living   2 2 2  0 0 0 0 0 0 2      Review of Systems  Constitutional: Negative for fever.  HENT: Positive for neck stiffness.   Gastrointestinal: Negative for nausea and vomiting.  Neurological: Positive for dizziness, focal weakness, weakness and paresthesias.    Allergies  Review of patient's allergies indicates no known allergies.  Home Medications     Current Outpatient Rx  Name Route Sig Dispense Refill  . IBUPROFEN 600 MG PO TABS Oral Take 600 mg by mouth every 6 (six) hours as needed. For pain.    . ALBUTEROL SULFATE HFA 108 (90 BASE) MCG/ACT IN AERS Inhalation Inhale 2 puffs into the lungs every 6 (six) hours as needed for wheezing. 1 Inhaler 2  . CALCIUM CARBONATE ANTACID 500 MG PO CHEW Oral Chew 2 tablets by mouth 3 (three) times daily as needed. For indigestion.    . CYCLOBENZAPRINE HCL 5 MG PO TABS Oral Take 1 tablet (5 mg total) by mouth 2 (two) times daily as needed for muscle spasms. 30 tablet 1  . OVER THE COUNTER MEDICATION Oral Take 3 tablets by mouth 2 (two) times daily. 360 joint and muscle relief    . PRENATAL MULTIVITAMIN CH Oral Take 1 tablet by mouth daily. 30 tablet 6    BP 116/73  Pulse 101  Temp 98.7 F (37.1 C) (Oral)  Resp 18  SpO2 98%  LMP 10/21/2011  Breastfeeding? Yes  Physical Exam  Nursing note and vitals reviewed. Constitutional: She is oriented to person, place, and time. She appears well-developed and well-nourished.  HENT:  Head: Normocephalic.  Right Ear: External ear normal.  Left Ear: External ear normal.  Mouth/Throat: Oropharynx is clear and moist.  Eyes: Conjunctivae normal and EOM are normal.  Pupils are equal, round, and reactive to light.  Neck: Normal range of motion. Neck supple.  Cardiovascular: Regular rhythm.   Pulmonary/Chest: Breath sounds normal.  Musculoskeletal: She exhibits no edema and no tenderness.  Lymphadenopathy:    She has no cervical adenopathy.  Neurological: She is alert and oriented to person, place, and time. She displays normal reflexes. No cranial nerve deficit. Coordination normal.  Skin: Skin is warm and dry.    ED Course  Procedures (including critical care time)  Labs Reviewed - No data to display No results found.   1. Paresthesias/numbness       MDM          Linna Hoff, MD 10/21/11 [phone]

## 2011-10-23 ENCOUNTER — Telehealth (HOSPITAL_COMMUNITY): Payer: Self-pay | Admitting: Family Medicine

## 2011-10-23 NOTE — ED Notes (Signed)
Patient called stating that we needed to fax information to [facility].  Confirmation received.  Patient notified

## 2011-10-28 ENCOUNTER — Telehealth (HOSPITAL_COMMUNITY): Payer: Self-pay | Admitting: Family Medicine

## 2011-10-28 NOTE — ED Notes (Signed)
[facility] sent fax stating patient appointment date was 10/30/11 at 2pm with Dr Pearlean Brownie.  Fax also states patient is aware.

## 2011-11-07 ENCOUNTER — Inpatient Hospital Stay (HOSPITAL_COMMUNITY)
Admission: AD | Admit: 2011-11-07 | Discharge: 2011-11-07 | Disposition: A | Discharge status: Home / Self Care | Payer: Medicaid Other | Source: Ambulatory Visit | Attending: Obstetrics & Gynecology | Admitting: Obstetrics & Gynecology

## 2011-11-07 ENCOUNTER — Encounter (HOSPITAL_COMMUNITY): Payer: Self-pay

## 2011-11-07 DIAGNOSIS — N949 Unspecified condition associated with female genital organs and menstrual cycle: Secondary | ICD-10-CM | POA: Insufficient documentation

## 2011-11-07 DIAGNOSIS — B3731 Acute candidiasis of vulva and vagina: Secondary | ICD-10-CM

## 2011-11-07 DIAGNOSIS — B373 Candidiasis of vulva and vagina: Secondary | ICD-10-CM | POA: Insufficient documentation

## 2011-11-07 DIAGNOSIS — R0982 Postnasal drip: Secondary | ICD-10-CM | POA: Insufficient documentation

## 2011-11-07 DIAGNOSIS — J029 Acute pharyngitis, unspecified: Secondary | ICD-10-CM | POA: Insufficient documentation

## 2011-11-07 LAB — WET PREP, GENITAL: Yeast Wet Prep HPF POC: NONE SEEN

## 2011-11-07 LAB — RAPID STREP SCREEN (MED CTR MEBANE ONLY): Streptococcus, Group A Screen (Direct): NEGATIVE

## 2011-11-07 MED ORDER — FLUCONAZOLE 150 MG PO TABS: 150.0000 mg | ORAL_TABLET | Freq: Once | ORAL | Status: DC

## 2011-11-07 MED ORDER — NYSTATIN-TRIAMCINOLONE 100000-0.1 UNIT/GM-% EX OINT: TOPICAL_OINTMENT | Freq: Two times a day (BID) | CUTANEOUS | Status: DC

## 2011-11-07 NOTE — MAU Note (Signed)
Patient states she has a vaginal rash that has radiated onto the upper thighs for about one month. Has had a sore throat for about one month.

## 2011-11-07 NOTE — MAU Provider Note (Signed)
History     CSN: [id number]  Arrival date and time: 11/07/11 7846   First Provider Initiated Contact with Patient 11/07/11 [phone]      Chief Complaint  Patient presents with  . Rash  . Sore Throat   HPI Jill Clarke is 26 y.o. [phone] Unknown weeks presenting with a month long sore throat and has a vaginal rash.  Reports ear "being crazy"--hurts with the sore throat.  Denies fever or cough.  Vaginal rash has been there a month, comes and goes, flares up.  Reports scratching to the point of breaking the skin.  Denies abnormal discharge.  Sexually active X 1.  Nexaplanon for contraception.  Has annual visit at [facility] scheduled for November 16.      Past Medical History  Diagnosis Date  . Unspecified high-risk pregnancy 01/27/2011  . Sickle cell trait   . Asthma     uses inhaler prn  . Gestational diabetes     Past Surgical History  Procedure Date  . Tonsillectomy     Family History  Problem Relation Age of Onset  . Sickle cell trait Mother   . Depression Mother   . Asthma Father   . Drug abuse Father     heroin abuse  . Kidney disease Father     due to heroin abuse  . Diabetes Maternal Grandmother     History  Substance Use Topics  . Smoking status: Never Smoker   . Smokeless tobacco: Never Used  . Alcohol Use: No    Allergies: No Known Allergies  No prescriptions prior to admission    Review of Systems  Constitutional: Negative for fever and chills.  HENT: Positive for ear pain (intermittent).   Eyes: Negative.   Respiratory: Negative for cough.   Cardiovascular: Negative.   Gastrointestinal: Negative.   Genitourinary:       +_ for external itching.  Neg for vaginal discharge   Physical Exam   Blood pressure 122/75, pulse 86, temperature 98.7 F (37.1 C), temperature source Oral, resp. rate 18, height 5\' 2"  (1.575 m), weight 75.206 kg (165 lb 12.8 oz), last menstrual period 10/06/2011, SpO2 98.00%, unknown if currently breastfeeding.  Physical  Exam  Constitutional: She is oriented to person, place, and time. She appears well-developed and well-nourished. No distress.  HENT:  Head: Normocephalic.  Mouth/Throat: No oropharyngeal exudate, posterior oropharyngeal edema, posterior oropharyngeal erythema or tonsillar abscesses.       Cobble-stone appearance   Cardiovascular: Normal rate.   Respiratory: Effort normal.  GI: Soft. There is no tenderness. There is no rebound and no guarding.  Genitourinary: There is rash (dry, scaly consisitent with yeast) on the right labia. There is rash on the left labia. Uterus is not enlarged. Cervix exhibits no discharge. Right adnexum displays no mass, no tenderness and no fullness. Left adnexum displays no mass, no tenderness and no fullness. There is erythema around the vagina. No bleeding around the vagina. Vaginal discharge (scant) found.  Lymphadenopathy:    She has no cervical adenopathy.  Neurological: She is alert and oriented to person, place, and time.  Skin: Skin is warm and dry.  Psychiatric: She has a normal mood and affect. Her behavior is normal.   Results for orders placed during the hospital encounter of 11/07/11 (from the past 24 hour(s))  WET PREP, GENITAL     Status: Abnormal   Collection Time   11/07/11  8:25 AM      Component Value Range   Yeast  Wet Prep HPF POC NONE SEEN  NONE SEEN   Trich, Wet Prep NONE SEEN  NONE SEEN   Clue Cells Wet Prep HPF POC NONE SEEN  NONE SEEN   WBC, Wet Prep HPF POC MODERATE (*) NONE SEEN  RAPID STREP SCREEN     Status: Normal   Collection Time   11/07/11  8:25 AM      Component Value Range   Streptococcus, Group A Screen (Direct) NEGATIVE  NEGATIVE   MAU Course  Procedures GC/CHL to lab  MDM   Assessment and Plan  A:  External Yeast      Viral pharyngitis vs post nasal drip from allergies==neg strept rapid test  P:  Rx for Diflucan 150mg  #2 tabs as directed     Rx for Nystatin Cream for external use bid prn      Suggested Claritin  or Zyrtec for post nasal drip/congestion       Donielle Kaigler,EVE M 11/07/2011, 10:15 AM

## 2011-11-08 NOTE — MAU Provider Note (Signed)
Chart reviewed and agree with management and plan.  

## 2011-11-17 ENCOUNTER — Ambulatory Visit: Payer: Medicaid Other | Admitting: Obstetrics and Gynecology

## 2011-12-01 ENCOUNTER — Ambulatory Visit: Payer: Medicaid Other | Admitting: Advanced Practice Midwife

## 2011-12-17 ENCOUNTER — Inpatient Hospital Stay (HOSPITAL_COMMUNITY)
Admission: AD | Admit: 2011-12-17 | Discharge: 2011-12-17 | Disposition: A | Discharge status: Home / Self Care | Payer: Medicaid Other | Source: Ambulatory Visit | Attending: Obstetrics & Gynecology | Admitting: Obstetrics & Gynecology

## 2011-12-17 ENCOUNTER — Encounter (HOSPITAL_COMMUNITY): Payer: Self-pay | Admitting: *Deleted

## 2011-12-17 ENCOUNTER — Inpatient Hospital Stay (HOSPITAL_COMMUNITY): Payer: Medicaid Other

## 2011-12-17 DIAGNOSIS — N949 Unspecified condition associated with female genital organs and menstrual cycle: Secondary | ICD-10-CM | POA: Insufficient documentation

## 2011-12-17 DIAGNOSIS — N898 Other specified noninflammatory disorders of vagina: Secondary | ICD-10-CM

## 2011-12-17 DIAGNOSIS — N939 Abnormal uterine and vaginal bleeding, unspecified: Secondary | ICD-10-CM

## 2011-12-17 DIAGNOSIS — N938 Other specified abnormal uterine and vaginal bleeding: Secondary | ICD-10-CM | POA: Insufficient documentation

## 2011-12-17 LAB — WET PREP, GENITAL
Clue Cells Wet Prep HPF POC: NONE SEEN
Trich, Wet Prep: NONE SEEN
Yeast Wet Prep HPF POC: NONE SEEN

## 2011-12-17 LAB — CBC
Platelets: 393 10*3/uL (ref 150–400)
RBC: 4.16 MIL/uL (ref 3.87–5.11)
WBC: 5.8 10*3/uL (ref 4.0–10.5)

## 2011-12-17 LAB — POCT PREGNANCY, URINE: Preg Test, Ur: NEGATIVE

## 2011-12-17 IMAGING — US US TRANSVAGINAL NON-OB
1 series · 14 of 25 positions shown · non-contrast
Comparison: Obstetric ultrasound of [DATE].

CLINICAL DATA: Birth control implant on [DATE].  Vaginal
bleeding daily since.  Pelvic pain.

TRANSABDOMINAL AND TRANSVAGINAL ULTRASOUND OF PELVIS
TECHNIQUE: Both transabdominal and transvaginal ultrasound
examinations of the pelvis were performed. Transabdominal technique
was performed for global imaging of the pelvis including uterus,
ovaries, adnexal regions, and pelvic cul-de-sac.
It was necessary to proceed with endovaginal exam following the
transabdominal exam to visualize the uterus, ovaries, and adnexa  .

[Series 1: us pelvis complete · 14 of 52 slices shown]
[im 1/52]
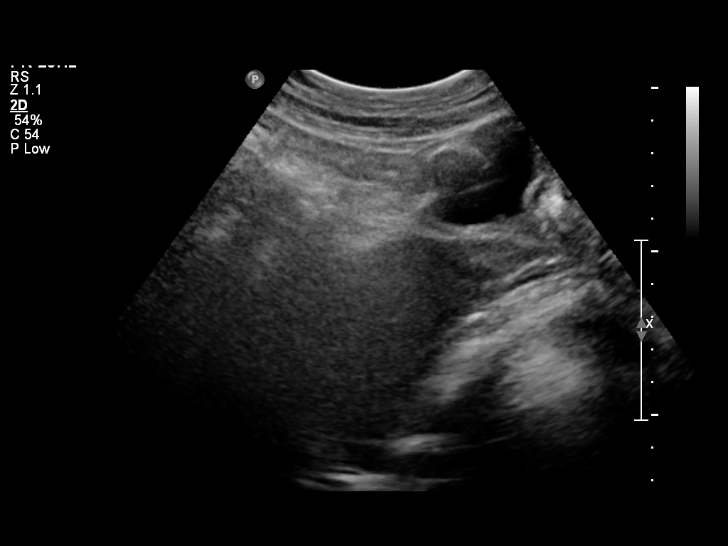
[im 5/52]
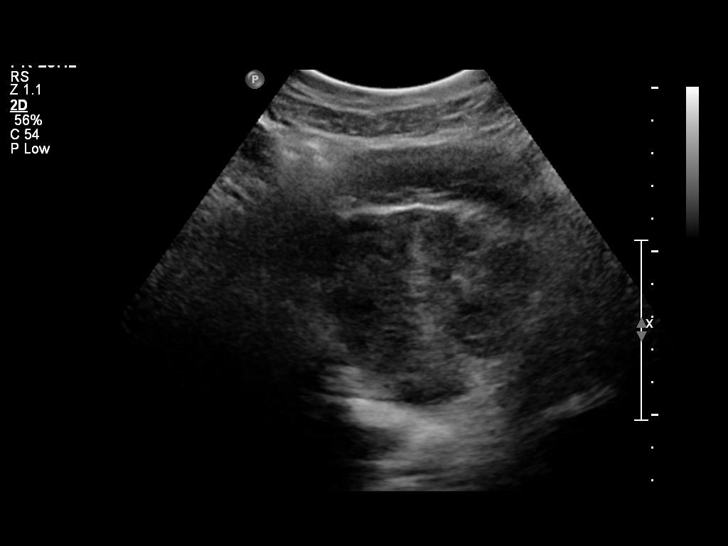
[im 9/52]
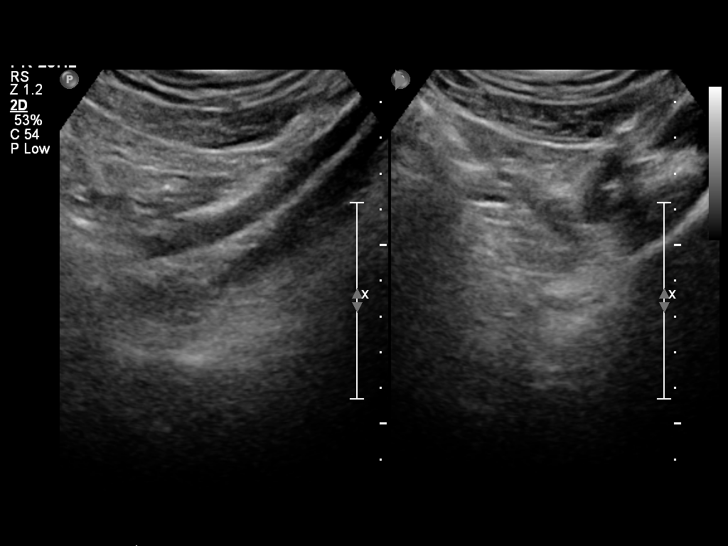
[im 13/52]
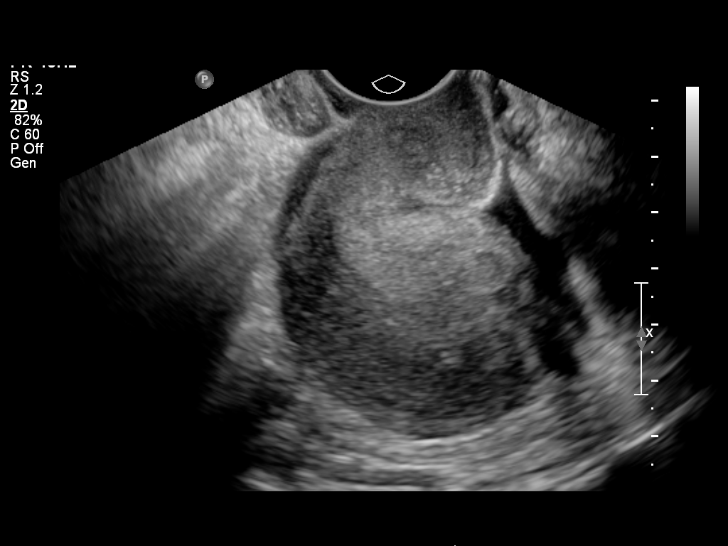
[im 18/52]
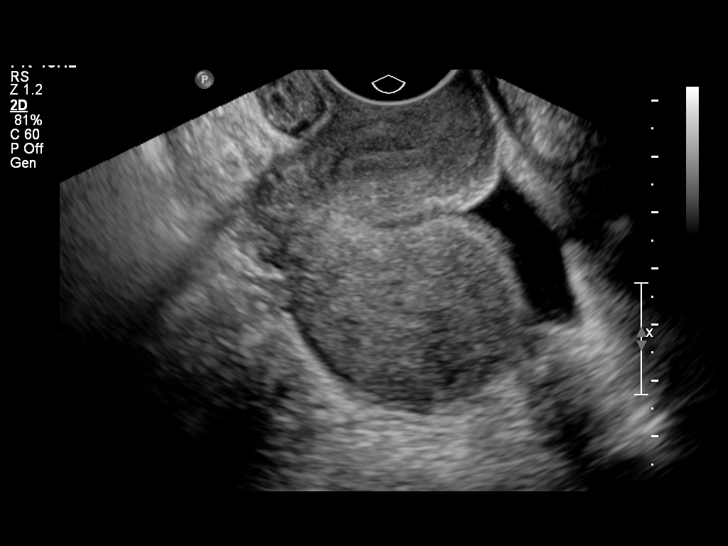
[im 20/52]
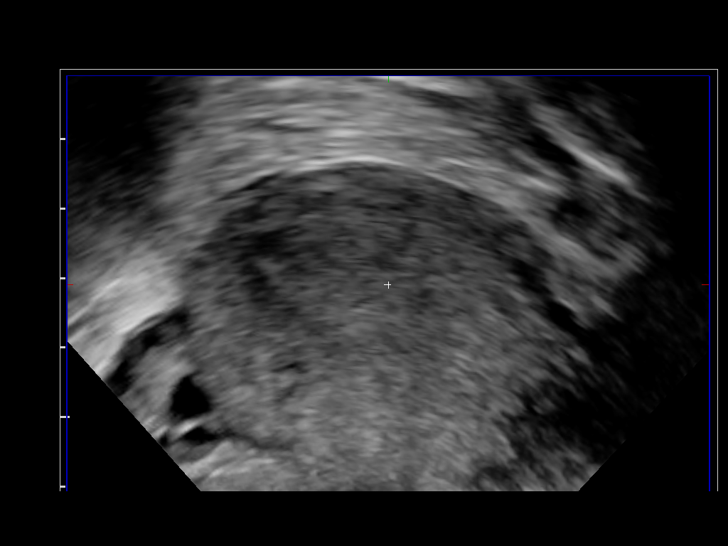
[im 24/52]
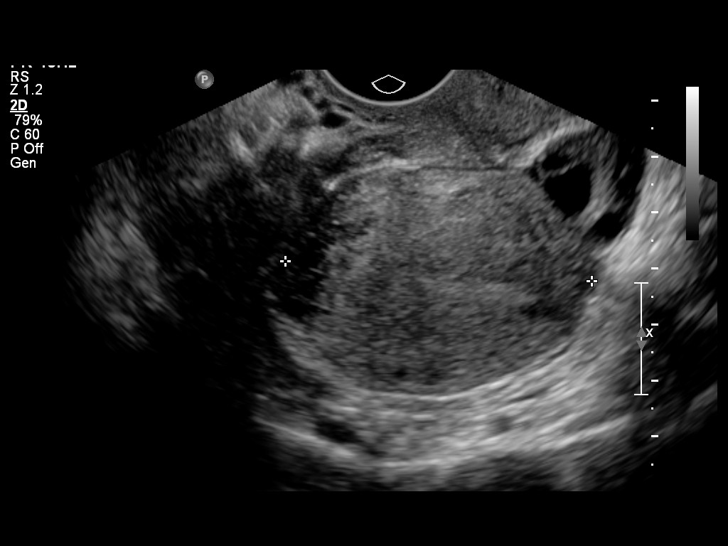
[im 28/52]
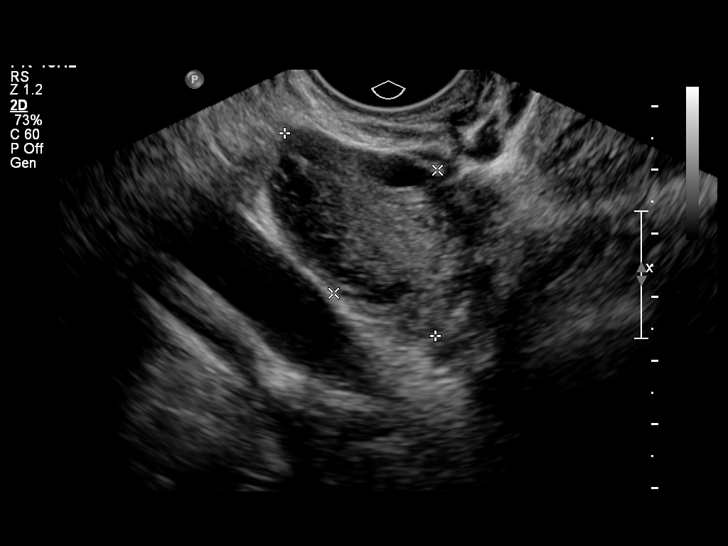
[im 32/52]
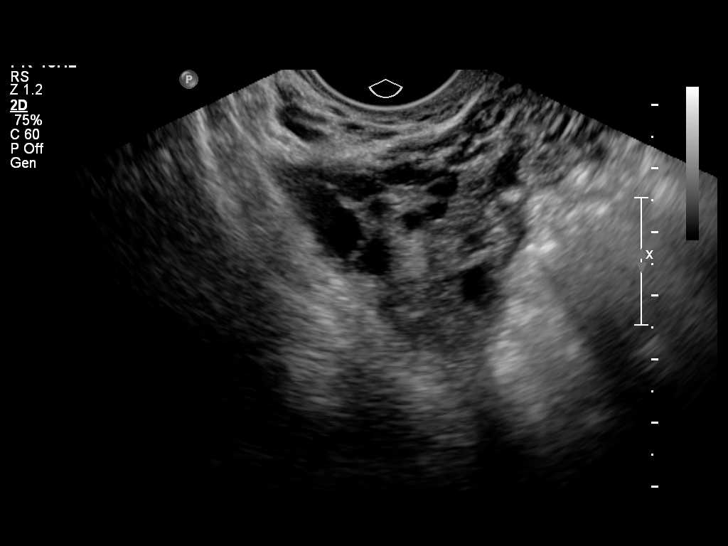
[im 35/52]
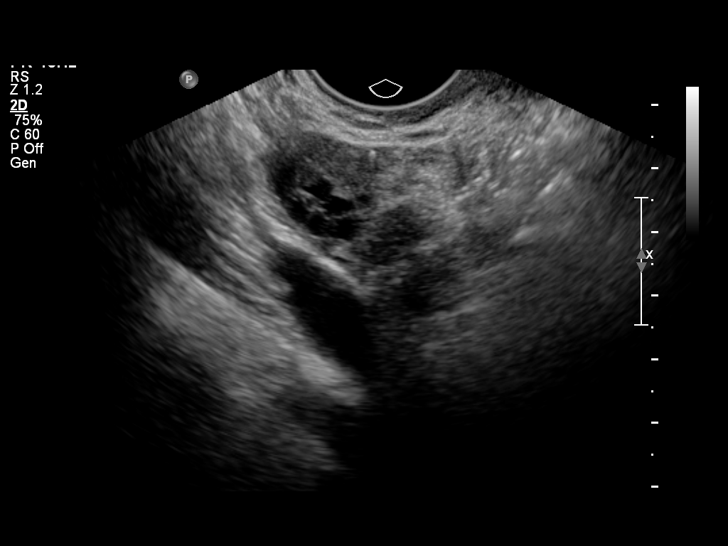
[im 39/52]
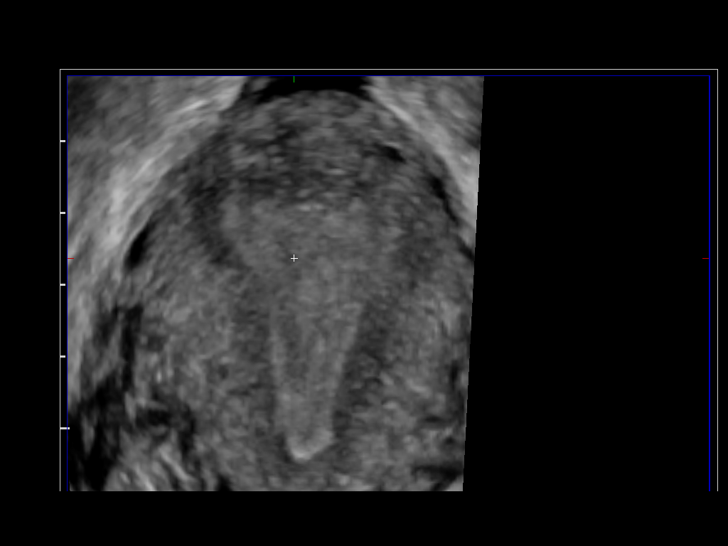
[im 43/52]
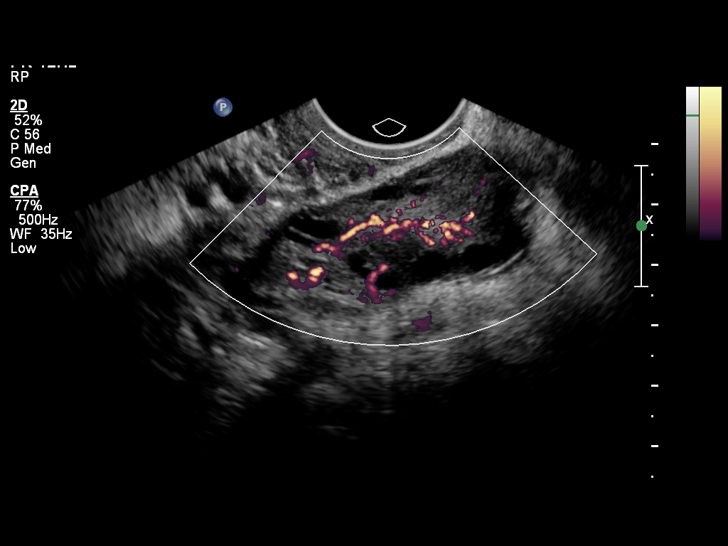
[im 47/52]
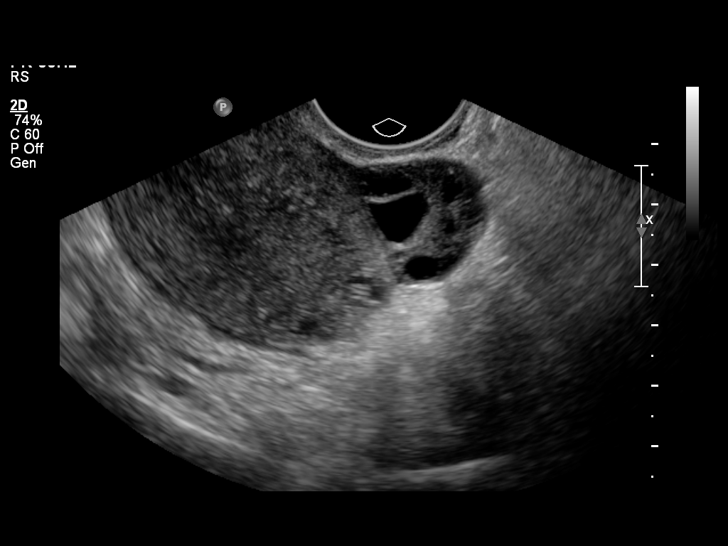
[im 52/52]
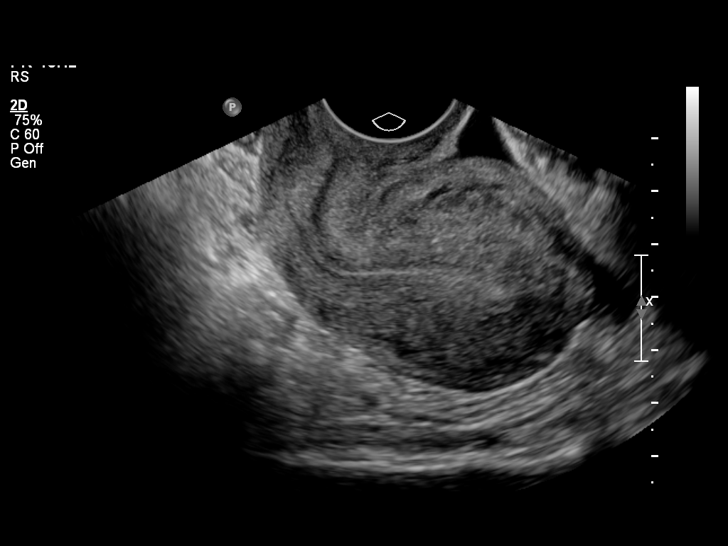

[14 of 25 positions shown; findings below may reference images not displayed]

FINDINGS: Uterus: Retroflexed.  7.5 x 4.1 x 5.5 cm. Normal in morphology.

Endometrium: Normal at 5 - 6 mm.

Right ovary:  4.0 x 2.5 x 2.6 cm. Normal in morphology.

Left ovary: 4.7 x 1.7 x 2.3 cm. Normal in morphology.

Other findings: Small volumefree pelvic fluid is likely
physiologic.
IMPRESSION: No acute process or explanation for vaginal bleeding/pelvic pain..

## 2011-12-17 NOTE — MAU Note (Signed)
States she has had bleeding every day since birth control initiated. States some days the bleeding will be brownish like it is going to stop, then becomes bright red again.

## 2011-12-17 NOTE — MAU Note (Signed)
Pt reports she had nexplanad implanted in September. Has been bleeding ever since.

## 2011-12-17 NOTE — MAU Provider Note (Signed)
History     CSN: [id number]  Arrival date and time: 12/17/11 1011   None     Chief Complaint  Patient presents with  . Vaginal Bleeding   HPI  Pt is not pregnant and has Nexplanon which was [facility] on Sept 9 [facility] and has been bleeding every since with some cramping.  She has been changing a pad 4 times a day and has been feeling weak and dizzy. Pt states she called the clinic and they told her this was not normal, so she is here to get it checked out.  She had a previous appt at the clinic, but didn't go. She has a cough and is nauseated from the drainage.  She denies unusual vaginal discharge, dysuria.  She denies diarrhea but has had constipation with last bowel movement.  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 Pt states her bleeding is not as bad today.  Past Medical History  Diagnosis Date  . Unspecified high-risk pregnancy 01/27/2011  . Sickle cell trait   . Asthma     uses inhaler prn  . Gestational diabetes     Past Surgical History  Procedure Date  . Tonsillectomy   . Wisdom tooth extraction     Family History  Problem Relation Age of Onset  . Sickle cell trait Mother   . Depression Mother   . Asthma Father   . Drug abuse Father     heroin abuse  . Kidney disease Father     due to heroin abuse  . Diabetes Maternal Grandmother     History  Substance Use Topics  . Smoking status: Never Smoker   . Smokeless tobacco: Never Used  . Alcohol Use: No    Allergies: No Known Allergies  Prescriptions prior to admission  Medication Sig Dispense Refill  . calcium carbonate (TUMS - DOSED IN MG ELEMENTAL CALCIUM) 500 MG chewable tablet Chew 2 tablets by mouth 3 (three) times daily as needed. For indigestion.      Marland Kitchen ibuprofen (ADVIL,MOTRIN) 600 MG tablet Take 600 mg by mouth every 6 (six) hours as needed. For pain.        Review of Systems  Constitutional: Negative for fever and chills.  Respiratory: Positive for cough. Negative for  hemoptysis.   Gastrointestinal: Positive for nausea, abdominal pain and constipation. Negative for diarrhea.  Genitourinary: Negative for dysuria, urgency and frequency.   Physical Exam   Blood pressure 117/85, pulse 91, temperature 98.8 F (37.1 C), temperature source Oral, resp. rate 18, height 5\' 1"  (1.549 m), weight 168 lb (76.204 kg), unknown if currently breastfeeding.  Physical Exam  Nursing note and vitals reviewed. Constitutional: She is oriented to person, place, and time. She appears well-developed and well-nourished.  HENT:  Head: Normocephalic.  Eyes: Pupils are equal, round, and reactive to light.  Neck: Normal range of motion. Neck supple.  Cardiovascular: Normal rate.   Respiratory: Effort normal.  GI: Soft. She exhibits no distension. There is no tenderness. There is no rebound and no guarding.  Genitourinary:       Small amount of watery blood tinged mucous in vault; cervix cean NT; uterus NSSC NT ; adnexa without palpable enlargement or tenderness  Musculoskeletal: Normal range of motion.  Neurological: She is alert and oriented to person, place, and time.  Skin: Skin is warm and dry.  Psychiatric: She has a normal mood and affect.    MAU Course  Procedures Results for  orders placed during the hospital encounter of 12/17/11 (from the past 24 hour(s))  POCT PREGNANCY, URINE     Status: Normal   Collection Time   12/17/11 11:28 AM      Component Value Range   Preg Test, Ur NEGATIVE  NEGATIVE  CBC     Status: Normal   Collection Time   12/17/11 11:37 AM      Component Value Range   WBC 5.8  4.0 - 10.5 K/uL   RBC 4.16  3.87 - 5.11 MIL/uL   Hemoglobin 12.1  12.0 - 15.0 g/dL   HCT 16.1  09.6 - 04.5 %   MCV 88.0  78.0 - 100.0 fL   MCH 29.1  26.0 - 34.0 pg   MCHC 33.1  30.0 - 36.0 g/dL   RDW 40.9  81.1 - 91.4 %   Platelets 393  150 - 400 K/uL  WET PREP, GENITAL     Status: Abnormal   Collection Time   12/17/11 11:50 AM      Component Value Range   Yeast  Wet Prep HPF POC NONE SEEN  NONE SEEN   Trich, Wet Prep NONE SEEN  NONE SEEN   Clue Cells Wet Prep HPF POC NONE SEEN  NONE SEEN   WBC, Wet Prep HPF POC FEW (*) NONE SEEN   Result     *RADIOLOGY REPORT*  Clinical Data: Birth control implant on 10/02/2011. Vaginal  bleeding daily since. Pelvic pain.  TRANSABDOMINAL AND TRANSVAGINAL ULTRASOUND OF PELVIS  Technique: Both transabdominal and transvaginal ultrasound  examinations of the pelvis were performed. Transabdominal technique  was performed for global imaging of the pelvis including uterus,  ovaries, adnexal regions, and pelvic cul-de-sac.  It was necessary to proceed with endovaginal exam following the  transabdominal exam to visualize the uterus, ovaries, and adnexa .  Comparison: Obstetric ultrasound of 06/30/2011.  Findings:  Uterus: Retroflexed. 7.5 x 4.1 x 5.5 cm. Normal in morphology.  Endometrium: Normal at 5 - 6 mm.  Right ovary: 4.0 x 2.5 x 2.6 cm. Normal in morphology.  Left ovary: 4.7 x 1.7 x 2.3 cm. Normal in morphology.  Other findings: Small volumefree pelvic fluid is likely  physiologic.  IMPRESSION:  No acute process or explanation for vaginal bleeding/pelvic pain.  Original Report Authenticated By: Jeronimo Greaves, M.D.       Assessment and Plan  Vaginal bleeding with Nexplanon F/u with clinic if bleeding becomes heavy Discussed normal response to Nexplanon   LINEBERRY,SUSAN 12/17/2011, 11:31 AM

## 2011-12-18 LAB — GC/CHLAMYDIA PROBE AMP, GENITAL
Chlamydia, DNA Probe: NEGATIVE
GC Probe Amp, Genital: NEGATIVE

## 2012-02-07 ENCOUNTER — Encounter: Payer: Self-pay | Admitting: *Deleted

## 2012-06-03 ENCOUNTER — Encounter (HOSPITAL_COMMUNITY): Payer: Self-pay | Admitting: Emergency Medicine

## 2012-06-03 ENCOUNTER — Emergency Department (HOSPITAL_COMMUNITY)
Admission: EM | Admit: 2012-06-03 | Discharge: 2012-06-03 | Disposition: A | Discharge status: Home / Self Care | Payer: Medicaid Other | Source: Home / Self Care | Attending: Emergency Medicine | Admitting: Emergency Medicine

## 2012-06-03 DIAGNOSIS — J45909 Unspecified asthma, uncomplicated: Secondary | ICD-10-CM

## 2012-06-03 DIAGNOSIS — R05 Cough: Secondary | ICD-10-CM

## 2012-06-03 MED ORDER — ALBUTEROL SULFATE (5 MG/ML) 0.5% IN NEBU
INHALATION_SOLUTION | RESPIRATORY_TRACT | Status: AC
  Filled 2012-06-03: qty 0.5

## 2012-06-03 MED ORDER — CETIRIZINE-PSEUDOEPHEDRINE ER 5-120 MG PO TB12: 1.0000 | ORAL_TABLET | Freq: Every day | ORAL | Status: AC

## 2012-06-03 MED ORDER — ALBUTEROL SULFATE HFA 108 (90 BASE) MCG/ACT IN AERS: 1.0000 | INHALATION_SPRAY | Freq: Four times a day (QID) | RESPIRATORY_TRACT | Status: DC | PRN

## 2012-06-03 MED ORDER — ALBUTEROL SULFATE (5 MG/ML) 0.5% IN NEBU
2.5000 mg | INHALATION_SOLUTION | Freq: Once | RESPIRATORY_TRACT | Status: AC
  Administered 2012-06-03: 2.5 mg via RESPIRATORY_TRACT

## 2012-06-03 NOTE — ED Provider Notes (Addendum)
History     CSN: [id number]  Arrival date & time 06/03/12  1805   First MD Initiated Contact with Patient 06/03/12 1821      Chief Complaint  Patient presents with  . Shortness of Breath    (Consider location/radiation/quality/duration/timing/severity/associated sxs/prior treatment) HPI Comments: Patient presents urgent care complaining of a ongoing cough that started today shortness of breath and some wheezing. Dizziness or asthma but has not had any of her usual and she ran out of it. She has been congested sneezing and runny nose as well. Patient has not been taking any antihistamines over-the-counter.no fevers.  Patient is a 27 y.o. female presenting with shortness of breath. The history is provided by the patient.  Shortness of Breath Severity:  Moderate Onset quality:  Gradual Duration:  1 day Timing:  Constant Progression:  Worsening Chronicity:  Recurrent Context: weather changes   Context: not activity, not animal exposure, not emotional upset, not known allergens, not occupational exposure, not smoke exposure and not URI   Relieved by:  Nothing Worsened by:  Nothing tried Associated symptoms: cough, sore throat, sputum production and wheezing   Associated symptoms: no abdominal pain, no chest pain, no diaphoresis, no ear pain, no fever, no headaches, no neck pain, no rash and no vomiting   Risk factors: no hx of PE/DVT, no prolonged immobilization, no recent surgery and no tobacco use     Past Medical History  Diagnosis Date  . Unspecified high-risk pregnancy 01/27/2011  . Sickle cell trait   . Asthma     uses inhaler prn  . Gestational diabetes     Past Surgical History  Procedure Laterality Date  . Tonsillectomy    . Wisdom tooth extraction      Family History  Problem Relation Age of Onset  . Sickle cell trait Mother   . Depression Mother   . Asthma Father   . Drug abuse Father     heroin abuse  . Kidney disease Father     due to heroin abuse  .  Diabetes Maternal Grandmother     History  Substance Use Topics  . Smoking status: Never Smoker   . Smokeless tobacco: Never Used  . Alcohol Use: No    OB History   Grav Para Term Preterm Abortions TAB SAB Ect Mult Living   2 2 2  0 0 0 0 0 0 2      Review of Systems  Constitutional: Positive for activity change and appetite change. Negative for fever, diaphoresis and fatigue.  HENT: Positive for congestion, sore throat and rhinorrhea. Negative for hearing loss, ear pain, nosebleeds, facial swelling, sneezing, neck pain, neck stiffness, dental problem, voice change, postnasal drip and ear discharge.   Respiratory: Positive for cough, sputum production, shortness of breath and wheezing. Negative for apnea, choking, chest tightness and stridor.   Cardiovascular: Negative for chest pain, palpitations and leg swelling.  Gastrointestinal: Negative for vomiting and abdominal pain.  Musculoskeletal: Negative for back pain and arthralgias.  Skin: Negative for color change and rash.  Allergic/Immunologic: Positive for environmental allergies.  Neurological: Negative for headaches.  Hematological: Negative for adenopathy. Does not bruise/bleed easily.    Allergies  Review of patient's allergies indicates no known allergies.  Home Medications   Current Outpatient Rx  Name  Route  Sig  Dispense  Refill  . albuterol (PROVENTIL HFA;VENTOLIN HFA) 108 (90 BASE) MCG/ACT inhaler   Inhalation   Inhale 1-2 puffs into the lungs every 6 (six) hours as  needed for wheezing or shortness of breath.   1 Inhaler   3   . calcium carbonate (TUMS - DOSED IN MG ELEMENTAL CALCIUM) 500 MG chewable tablet   Oral   Chew 2 tablets by mouth 3 (three) times daily as needed. For indigestion.         . cetirizine-pseudoephedrine (ZYRTEC-D) 5-120 MG per tablet   Oral   Take 1 tablet by mouth daily.   30 tablet   0   . ibuprofen (ADVIL,MOTRIN) 600 MG tablet   Oral   Take 600 mg by mouth every 6 (six)  hours as needed. For pain.           BP 119/83  Pulse 106  Temp(Src) 99 F (37.2 C) (Oral)  Resp 22  SpO2 100%  Breastfeeding? No  Physical Exam  Constitutional: She is oriented to person, place, and time. She appears well-developed and well-nourished. No distress.  HENT:  Head: Normocephalic.  Eyes: Conjunctivae are normal. Right eye exhibits no discharge. Left eye exhibits no discharge. No scleral icterus.  Neck: Neck supple. No JVD present.  Pulmonary/Chest: Effort normal. No respiratory distress. She has decreased breath sounds. She has no wheezes. She has no rhonchi. She has no rales. She exhibits no tenderness.  Abdominal: Soft. Normal appearance. There is no hepatosplenomegaly or splenomegaly. There is no tenderness. There is no rebound, no guarding, no CVA tenderness, no tenderness at McBurney's point and negative Murphy's sign. No hernia. Hernia confirmed negative in the ventral area.  Lymphadenopathy:    She has no cervical adenopathy.  Neurological: She is alert and oriented to person, place, and time.  Skin: No rash noted. No erythema.    ED Course  Procedures (including critical care time)  Labs Reviewed - No data to display No results found.   1. Reactive airway disease   2. Cough     Improved after breathing treatment x 1  MDM  Environmental allergies with reactive airway disease. Patient has been cursor start with albuterol every 4-6 hours and to start with Zyrtec the next one to 2 weeks.        Jimmie Molly, MD 06/03/12 1912  Jimmie Molly, MD 06/03/12 1610

## 2012-06-03 NOTE — ED Notes (Signed)
Pt c/o SOB onset this am Sx also include: productive cough w/clear phlegm, runny nose, nasal congestion Denies: f/v/n/d Hx of asthma; misplaced inhaler and needing another one  She is alert and oriented w/no signs of acute distress.

## 2012-12-14 ENCOUNTER — Inpatient Hospital Stay (HOSPITAL_COMMUNITY)
Admission: AD | Admit: 2012-12-14 | Discharge: 2012-12-15 | Disposition: A | Discharge status: Home / Self Care | Payer: Medicaid Other | Source: Ambulatory Visit | Attending: Obstetrics & Gynecology | Admitting: Obstetrics & Gynecology

## 2012-12-14 ENCOUNTER — Encounter (HOSPITAL_COMMUNITY): Payer: Self-pay | Admitting: *Deleted

## 2012-12-14 DIAGNOSIS — L293 Anogenital pruritus, unspecified: Secondary | ICD-10-CM | POA: Insufficient documentation

## 2012-12-14 DIAGNOSIS — B356 Tinea cruris: Secondary | ICD-10-CM | POA: Insufficient documentation

## 2012-12-14 MED ORDER — FLUCONAZOLE 150 MG PO TABS
150.0000 mg | ORAL_TABLET | Freq: Once | ORAL | Status: AC
  Administered 2012-12-15: 150 mg via ORAL
  Filled 2012-12-14: qty 1

## 2012-12-14 NOTE — MAU Provider Note (Signed)
History     CSN: [id number]  Arrival date and time: 12/14/12 2316   First Provider Initiated Contact with Patient 12/14/12 2346      Chief Complaint  Patient presents with  . Vaginal Itching   HPI  Pt is a 27 yo G2P2002 here with report of vaginal itching x 2 weeks.  No report of abnormal odor, bleeding or abdominal pain.  Current birth control method is Nexplanon.  Patient's last menstrual period was 11/07/2012.    Past Medical History  Diagnosis Date  . Unspecified high-risk pregnancy 01/27/2011  . Sickle cell trait   . Asthma     uses inhaler prn  . Gestational diabetes     Past Surgical History  Procedure Laterality Date  . Tonsillectomy    . Wisdom tooth extraction      Family History  Problem Relation Age of Onset  . Sickle cell trait Mother   . Depression Mother   . Asthma Father   . Drug abuse Father     heroin abuse  . Kidney disease Father     due to heroin abuse  . Diabetes Maternal Grandmother     History  Substance Use Topics  . Smoking status: Former Games developer  . Smokeless tobacco: Never Used  . Alcohol Use: No    Allergies: No Known Allergies  Prescriptions prior to admission  Medication Sig Dispense Refill  . albuterol (PROVENTIL HFA;VENTOLIN HFA) 108 (90 BASE) MCG/ACT inhaler Inhale 1-2 puffs into the lungs every 6 (six) hours as needed for wheezing or shortness of breath.  1 Inhaler  3  . calcium carbonate (TUMS - DOSED IN MG ELEMENTAL CALCIUM) 500 MG chewable tablet Chew 2 tablets by mouth 3 (three) times daily as needed. For indigestion.      Marland Kitchen ibuprofen (ADVIL,MOTRIN) 600 MG tablet Take 600 mg by mouth every 6 (six) hours as needed. For pain.        Review of Systems  Genitourinary:       Vaginal itching and rash  All other systems reviewed and are negative.   Physical Exam   Blood pressure 117/79, pulse 97, temperature 98.7 F (37.1 C), resp. rate 20, height 5' (1.524 m), weight 78.2 kg (172 lb 6.4 oz), last menstrual period  11/07/2012.  Physical Exam  Constitutional: She is oriented to person, place, and time. She appears well-developed and well-nourished. No distress.  HENT:  Head: Normocephalic.  Neck: Normal range of motion. Neck supple.  Cardiovascular: Normal rate and regular rhythm.   Respiratory: Effort normal and breath sounds normal.  GI: Soft. There is no tenderness.  Genitourinary:    Cervix exhibits motion tenderness. There is erythema (along the creases bilat) around the vagina. Vaginal discharge found.  Musculoskeletal: Normal range of motion. She exhibits no edema.  Neurological: She is alert and oriented to person, place, and time.  Skin: Skin is warm and dry.    MAU Course  Procedures Results for orders placed during the hospital encounter of 12/14/12 (from the past 24 hour(s))  WET PREP, GENITAL     Status: Abnormal   Collection Time    12/14/12 11:57 PM      Result Value Range   Yeast Wet Prep HPF POC NONE SEEN  NONE SEEN   Trich, Wet Prep NONE SEEN  NONE SEEN   Clue Cells Wet Prep HPF POC NONE SEEN  NONE SEEN   WBC, Wet Prep HPF POC RARE (*) NONE SEEN   Diflucan 150 mg PO  in MAU  Assessment and Plan  Tinea Cruris  Plan: Discharge to home RX Mycolog RX Terazol   [facility] 12/14/2012, 11:47 PM

## 2012-12-14 NOTE — MAU Note (Signed)
I have vaginal itching on outside and rash in groin area.

## 2012-12-15 ENCOUNTER — Other Ambulatory Visit: Payer: Self-pay | Admitting: Family

## 2012-12-15 DIAGNOSIS — B356 Tinea cruris: Secondary | ICD-10-CM

## 2012-12-15 LAB — WET PREP, GENITAL
Trich, Wet Prep: NONE SEEN
Yeast Wet Prep HPF POC: NONE SEEN

## 2012-12-15 MED ORDER — TERCONAZOLE 0.4 % VA CREA: 1.0000 | TOPICAL_CREAM | Freq: Every day | VAGINAL | Status: DC

## 2012-12-15 MED ORDER — KETOCONAZOLE 2 % EX CREA: 1.0000 "application " | TOPICAL_CREAM | Freq: Every day | CUTANEOUS | Status: DC

## 2012-12-15 MED ORDER — NYSTATIN-TRIAMCINOLONE 100000-0.1 UNIT/GM-% EX OINT: 1.0000 "application " | TOPICAL_OINTMENT | Freq: Two times a day (BID) | CUTANEOUS | Status: DC

## 2012-12-15 NOTE — MAU Provider Note (Signed)
Attestation of Attending Supervision of Advanced Practitioner (PA/CNM/NP): Evaluation and management procedures were performed by the Advanced Practitioner under my supervision and collaboration.  I have reviewed the Advanced Practitioner's note and chart, and I agree with the management and plan.  Alyzza Andringa, MD, FACOG Attending Obstetrician & Gynecologist Faculty Practice, [facility]  

## 2013-02-11 ENCOUNTER — Emergency Department (HOSPITAL_COMMUNITY)
Admission: EM | Admit: 2013-02-11 | Discharge: 2013-02-11 | Disposition: A | Discharge status: Home / Self Care | Payer: Medicaid Other | Source: Home / Self Care

## 2013-02-11 ENCOUNTER — Encounter (HOSPITAL_COMMUNITY): Payer: Self-pay | Admitting: Emergency Medicine

## 2013-02-11 DIAGNOSIS — H60399 Other infective otitis externa, unspecified ear: Secondary | ICD-10-CM

## 2013-02-11 DIAGNOSIS — H6092 Unspecified otitis externa, left ear: Secondary | ICD-10-CM

## 2013-02-11 DIAGNOSIS — L089 Local infection of the skin and subcutaneous tissue, unspecified: Secondary | ICD-10-CM

## 2013-02-11 MED ORDER — AMOXICILLIN-POT CLAVULANATE 875-125 MG PO TABS: 1.0000 | ORAL_TABLET | Freq: Two times a day (BID) | ORAL | Status: DC

## 2013-02-11 MED ORDER — NEOMYCIN-POLYMYXIN-HC 3.5-10000-1 OT SUSP: 3.0000 [drp] | OTIC | Status: DC

## 2013-02-11 NOTE — ED Provider Notes (Signed)
CSN: [id number]     Arrival date & time 02/11/13  1151 History   First MD Initiated Contact with Patient 02/11/13 1343     Chief Complaint  Patient presents with  . Otalgia   (Consider location/radiation/quality/duration/timing/severity/associated sxs/prior Treatment) HPI Comments: 28 year old female complaining of left ear pain for approximately one week. She states there is pain close the outside and it feels clogged. She denies upper respiratory congestion, nasal congestion, sore throat or fever.   Past Medical History  Diagnosis Date  . Unspecified high-risk pregnancy 01/27/2011  . Sickle cell trait   . Asthma     uses inhaler prn  . Gestational diabetes    Past Surgical History  Procedure Laterality Date  . Tonsillectomy    . Wisdom tooth extraction     Family History  Problem Relation Age of Onset  . Sickle cell trait Mother   . Depression Mother   . Asthma Father   . Drug abuse Father     heroin abuse  . Kidney disease Father     due to heroin abuse  . Diabetes Maternal Grandmother    History  Substance Use Topics  . Smoking status: Former Research scientist (life sciences)  . Smokeless tobacco: Never Used  . Alcohol Use: No   OB History   Grav Para Term Preterm Abortions TAB SAB Ect Mult Living   2 2 2  0 0 0 0 0 0 2     Review of Systems  Constitutional: Negative.  Negative for fever.  HENT: Positive for ear pain. Negative for congestion, mouth sores, nosebleeds, postnasal drip, rhinorrhea, sinus pressure, sneezing, sore throat and tinnitus.   Eyes: Negative.   Respiratory: Negative.   Gastrointestinal: Negative.   Neurological: Negative for dizziness, light-headedness and headaches.    Allergies  Review of patient's allergies indicates no known allergies.  Home Medications   Current Outpatient Rx  Name  Route  Sig  Dispense  Refill  . albuterol (PROVENTIL HFA;VENTOLIN HFA) 108 (90 BASE) MCG/ACT inhaler   Inhalation   Inhale 1-2 puffs into the lungs every 6 (six) hours as  needed for wheezing or shortness of breath.   1 Inhaler   3   . amoxicillin-clavulanate (AUGMENTIN) 875-125 MG per tablet   Oral   Take 1 tablet by mouth every 12 (twelve) hours.   14 tablet   0   . calcium carbonate (TUMS - DOSED IN MG ELEMENTAL CALCIUM) 500 MG chewable tablet   Oral   Chew 2 tablets by mouth 3 (three) times daily as needed. For indigestion.         Marland Kitchen ibuprofen (ADVIL,MOTRIN) 600 MG tablet   Oral   Take 600 mg by mouth every 6 (six) hours as needed. For pain.         Marland Kitchen ketoconazole (NIZORAL) 2 % cream   Topical   Apply 1 application topically daily.   15 g   0   . neomycin-polymyxin-hydrocortisone (CORTISPORIN) 3.5-10000-1 otic suspension   Left Ear   Place 3 drops into the left ear every 4 (four) hours. X 5 days   7.5 mL   0   . terconazole (TERAZOL 7) 0.4 % vaginal cream   Vaginal   Place 1 applicator vaginally at bedtime.   45 g   0    BP 122/78  Pulse 89  Temp(Src) 98.9 F (37.2 C) (Oral)  Resp 20  SpO2 100% Physical Exam  Nursing note and vitals reviewed. Constitutional: She is oriented to person, place,  and time. She appears well-developed and well-nourished. No distress.  HENT:  Right Ear: External ear normal.  Mouth/Throat: Oropharynx is clear and moist.  Left TM is pearly gray, transparent without erythema, effusion or retraction. There is a 2-3 mm raisedk at the meatus of the left EAC. There is minor erythema extending 4-5 mm to the surrounding soft tissue. No apparent involvement of the outer ear or over cartilaginous tissue. There is no drainage or purulent. There is tenderness over the area of the pustule and no other area.  Eyes: Conjunctivae and EOM are normal.  Neck: Normal range of motion. Neck supple.  Pulmonary/Chest: Effort normal. No respiratory distress.  Lymphadenopathy:    She has no cervical adenopathy.  Neurological: She is alert and oriented to person, place, and time.  Skin: Skin is warm and dry.  Psychiatric:  She has a normal mood and affect.    ED Course  Procedures (including critical care time) Labs Review Labs Reviewed - No data to display Imaging Review No results found.    MDM   1. Otitis externa of left ear   2. Pustule     This is more of ak of the external ear canal at the meatus. Has produced minor erythema (cellulitis) extending only a few millimeters out from the pustule. No erythema is observed in the outer ear or over the cartilaginous areas. Compresses several times during the day Cortisporin otic drops 4 times a day Augmentin 875 twice a day for 7 days For worsening symptoms or problems may return.    Janne Napoleon, NP 02/11/13 1402  Janne Napoleon, NP 02/11/13 [phone]

## 2013-02-11 NOTE — ED Notes (Signed)
C/o left ear pain x 1 wk.  Tender to touch.  Feels clogged.  Denies drainage and fever.  No otc meds used for symptoms.

## 2013-02-11 NOTE — Discharge Instructions (Signed)
Ear Drops, Adult You have been diagnosed with a condition requiring you to put drops of medication into your outer ear. HOME CARE INSTRUCTIONS   Put drops in the affected ear as instructed. After putting the drops in, you will need to lay down with the affected ear facing up for ten minutes so the drops will remain in the ear canal and run down and fill the canal. Continue using eardrops for as long as directed by your health care provider.  Prior to getting up, put a cotton ball gently in your ear canal. Leave enough of the ball out so it can be easily removed. Do not attempt to push this down into the canal with a cotton-tipped swab or other instrument.  Do not irrigate or wash out your ears if you have had a perforated eardrum or mastoid surgery, or unless instructed to do so by your health care provider.  Keep appointments with your health care provider as instructed.  Finish all medications, or use for the length of time as instructed. Continue the drops even if your problem seems to be doing well after a couple days, or continue as instructed. SEEK MEDICAL CARE IF:  You become worse or develop increasing pain.  You notice any unusual drainage from your ear (particularly if the drainage stinks).  You develop hearing difficulties.  You experience a serious form of dizziness in which you feel as if the room is spinning, and you feel nauseated (vertigo).  The outside of your ear becomes red or swollen or both. This may be a sign of an allergic reaction. MAKE SURE YOU:   Understand these instructions.  Will watch your condition.  Will get help right away if you are not doing well or get worse. Document Released: 01/03/2001 Document Revised: 10/30/2012 Document Reviewed: 08/06/2012 [facility] Patient Information 2014 [place], [place].  Otitis Externa Otitis externa is a bacterial or fungal infection of the outer ear canal. This is the area from the eardrum to the outside of the ear.  Otitis externa is sometimes called "swimmer's ear." CAUSES  Possible causes of infection include:  Swimming in dirty water.  Moisture remaining in the ear after swimming or bathing.  Mild injury (trauma) to the ear.  Objects stuck in the ear (foreign body).  Cuts or scrapes (abrasions) on the outside of the ear. SYMPTOMS  The first symptom of infection is often itching in the ear canal. Later signs and symptoms may include swelling and redness of the ear canal, ear pain, and yellowish-white fluid (pus) coming from the ear. The ear pain may be worse when pulling on the earlobe. DIAGNOSIS  Your caregiver will perform a physical exam. A sample of fluid may be taken from the ear and examined for bacteria or fungi. TREATMENT  Antibiotic ear drops are often given for 10 to 14 days. Treatment may also include pain medicine or corticosteroids to reduce itching and swelling. PREVENTION   Keep your ear dry. Use the corner of a towel to absorb water out of the ear canal after swimming or bathing.  Avoid scratching or putting objects inside your ear. This can damage the ear canal or remove the protective wax that lines the canal. This makes it easier for bacteria and fungi to grow.  Avoid swimming in lakes, polluted water, or poorly chlorinated pools.  You may use ear drops made of rubbing alcohol and vinegar after swimming. Combine equal parts of white vinegar and alcohol in a bottle. Put 3 or 4  drops into each ear after swimming. HOME CARE INSTRUCTIONS   Apply antibiotic ear drops to the ear canal as prescribed by your caregiver.  Only take over-the-counter or prescription medicines for pain, discomfort, or fever as directed by your caregiver.  If you have diabetes, follow any additional treatment instructions from your caregiver.  Keep all follow-up appointments as directed by your caregiver. SEEK MEDICAL CARE IF:   You have a fever.  Your ear is still red, swollen, painful, or  draining pus after 3 days.  Your redness, swelling, or pain gets worse.  You have a severe headache.  You have redness, swelling, pain, or tenderness in the area behind your ear. MAKE SURE YOU:   Understand these instructions.  Will watch your condition.  Will get help right away if you are not doing well or get worse. Document Released: 01/09/2005 Document Revised: 04/03/2011 Document Reviewed: 01/26/2011 [facility] Patient Information 2014 [place].  Cellulitis Cellulitis is an infection of the skin and the tissue under the skin. The infected area is usually red and tender. This happens most often in the arms and lower legs. HOME CARE   Take your antibiotic medicine as told. Finish the medicine even if you start to feel better.  Keep the infected arm or leg raised (elevated).  Put a warm cloth on the area up to 4 times per day.  Only take medicines as told by your doctor.  Keep all doctor visits as told. GET HELP RIGHT AWAY IF:   You have a fever.  You feel very sleepy.  You throw up (vomit) or have watery poop (diarrhea).  You feel sick and have muscle aches and pains.  You see red streaks on the skin coming from the infected area.  Your red area gets bigger or turns a dark color.  Your bone or joint under the infected area is painful after the skin heals.  Your infection comes back in the same area or different area.  You have a puffy (swollen) bump in the infected area.  You have new symptoms. MAKE SURE YOU:   Understand these instructions.  Will watch your condition.  Will get help right away if you are not doing well or get worse. Document Released: 06/28/2007 Document Revised: 07/11/2011 Document Reviewed: 03/27/2011 [facility] Patient Information 2014 [place], [place].

## 2013-02-11 NOTE — ED Provider Notes (Signed)
Medical screening examination/treatment/procedure(s) were performed by resident physician or non-physician practitioner and as supervising physician I was immediately available for consultation/collaboration.   Pauline Good MD.   Billy Fischer, MD 02/11/13 [phone]

## 2013-11-24 ENCOUNTER — Encounter (HOSPITAL_COMMUNITY): Payer: Self-pay | Admitting: Emergency Medicine

## 2014-01-19 ENCOUNTER — Encounter (HOSPITAL_COMMUNITY): Payer: Self-pay | Admitting: Emergency Medicine

## 2014-01-19 ENCOUNTER — Emergency Department (HOSPITAL_COMMUNITY)
Admission: EM | Admit: 2014-01-19 | Discharge: 2014-01-19 | Disposition: A | Discharge status: Home / Self Care | Payer: Medicaid Other | Attending: Emergency Medicine | Admitting: Emergency Medicine

## 2014-01-19 ENCOUNTER — Emergency Department (HOSPITAL_COMMUNITY): Payer: Medicaid Other

## 2014-01-19 DIAGNOSIS — M940 Chondrocostal junction syndrome [Tietze]: Secondary | ICD-10-CM | POA: Diagnosis not present

## 2014-01-19 DIAGNOSIS — Z862 Personal history of diseases of the blood and blood-forming organs and certain disorders involving the immune mechanism: Secondary | ICD-10-CM | POA: Insufficient documentation

## 2014-01-19 DIAGNOSIS — J069 Acute upper respiratory infection, unspecified: Secondary | ICD-10-CM

## 2014-01-19 DIAGNOSIS — Z792 Long term (current) use of antibiotics: Secondary | ICD-10-CM | POA: Diagnosis not present

## 2014-01-19 DIAGNOSIS — J45901 Unspecified asthma with (acute) exacerbation: Secondary | ICD-10-CM

## 2014-01-19 DIAGNOSIS — Z87891 Personal history of nicotine dependence: Secondary | ICD-10-CM | POA: Diagnosis not present

## 2014-01-19 DIAGNOSIS — R079 Chest pain, unspecified: Secondary | ICD-10-CM | POA: Insufficient documentation

## 2014-01-19 DIAGNOSIS — Z79899 Other long term (current) drug therapy: Secondary | ICD-10-CM | POA: Diagnosis not present

## 2014-01-19 DIAGNOSIS — R0602 Shortness of breath: Secondary | ICD-10-CM

## 2014-01-19 DIAGNOSIS — R0789 Other chest pain: Secondary | ICD-10-CM

## 2014-01-19 DIAGNOSIS — R1110 Vomiting, unspecified: Secondary | ICD-10-CM | POA: Insufficient documentation

## 2014-01-19 LAB — BASIC METABOLIC PANEL
Anion gap: 6 (ref 5–15)
BUN: 10 mg/dL (ref 6–23)
CHLORIDE: 106 meq/L (ref 96–112)
CO2: 25 mmol/L (ref 19–32)
Calcium: 9.1 mg/dL (ref 8.4–10.5)
Creatinine, Ser: 0.54 mg/dL (ref 0.50–1.10)
GFR calc Af Amer: >90 mL/min (ref >90)
GLUCOSE: 100 mg/dL — AB (ref 70–99)
POTASSIUM: 4 mmol/L (ref 3.5–5.1)
SODIUM: 137 mmol/L (ref 135–145)

## 2014-01-19 LAB — CBC
HEMATOCRIT: 37.1 % (ref 36.0–46.0)
HEMOGLOBIN: 12.1 g/dL (ref 12.0–15.0)
MCH: 28.6 pg (ref 26.0–34.0)
MCHC: 32.6 g/dL (ref 30.0–36.0)
MCV: 87.7 fL (ref 78.0–100.0)
Platelets: 481 10*3/uL — ABNORMAL HIGH (ref 150–400)
RBC: 4.23 MIL/uL (ref 3.87–5.11)
RDW: 12.9 % (ref 11.5–15.5)
WBC: 6 10*3/uL (ref 4.0–10.5)

## 2014-01-19 LAB — I-STAT TROPONIN, ED: TROPONIN I, POC: 0 ng/mL (ref 0.00–0.08)

## 2014-01-19 IMAGING — DX DG CHEST 2V
2 series · 2 of 2 positions shown · non-contrast
Comparison: [DATE]

CLINICAL DATA: Shortness of breath with cough and wheeze.

EXAM:
CHEST  2 VIEW

[chest pa]
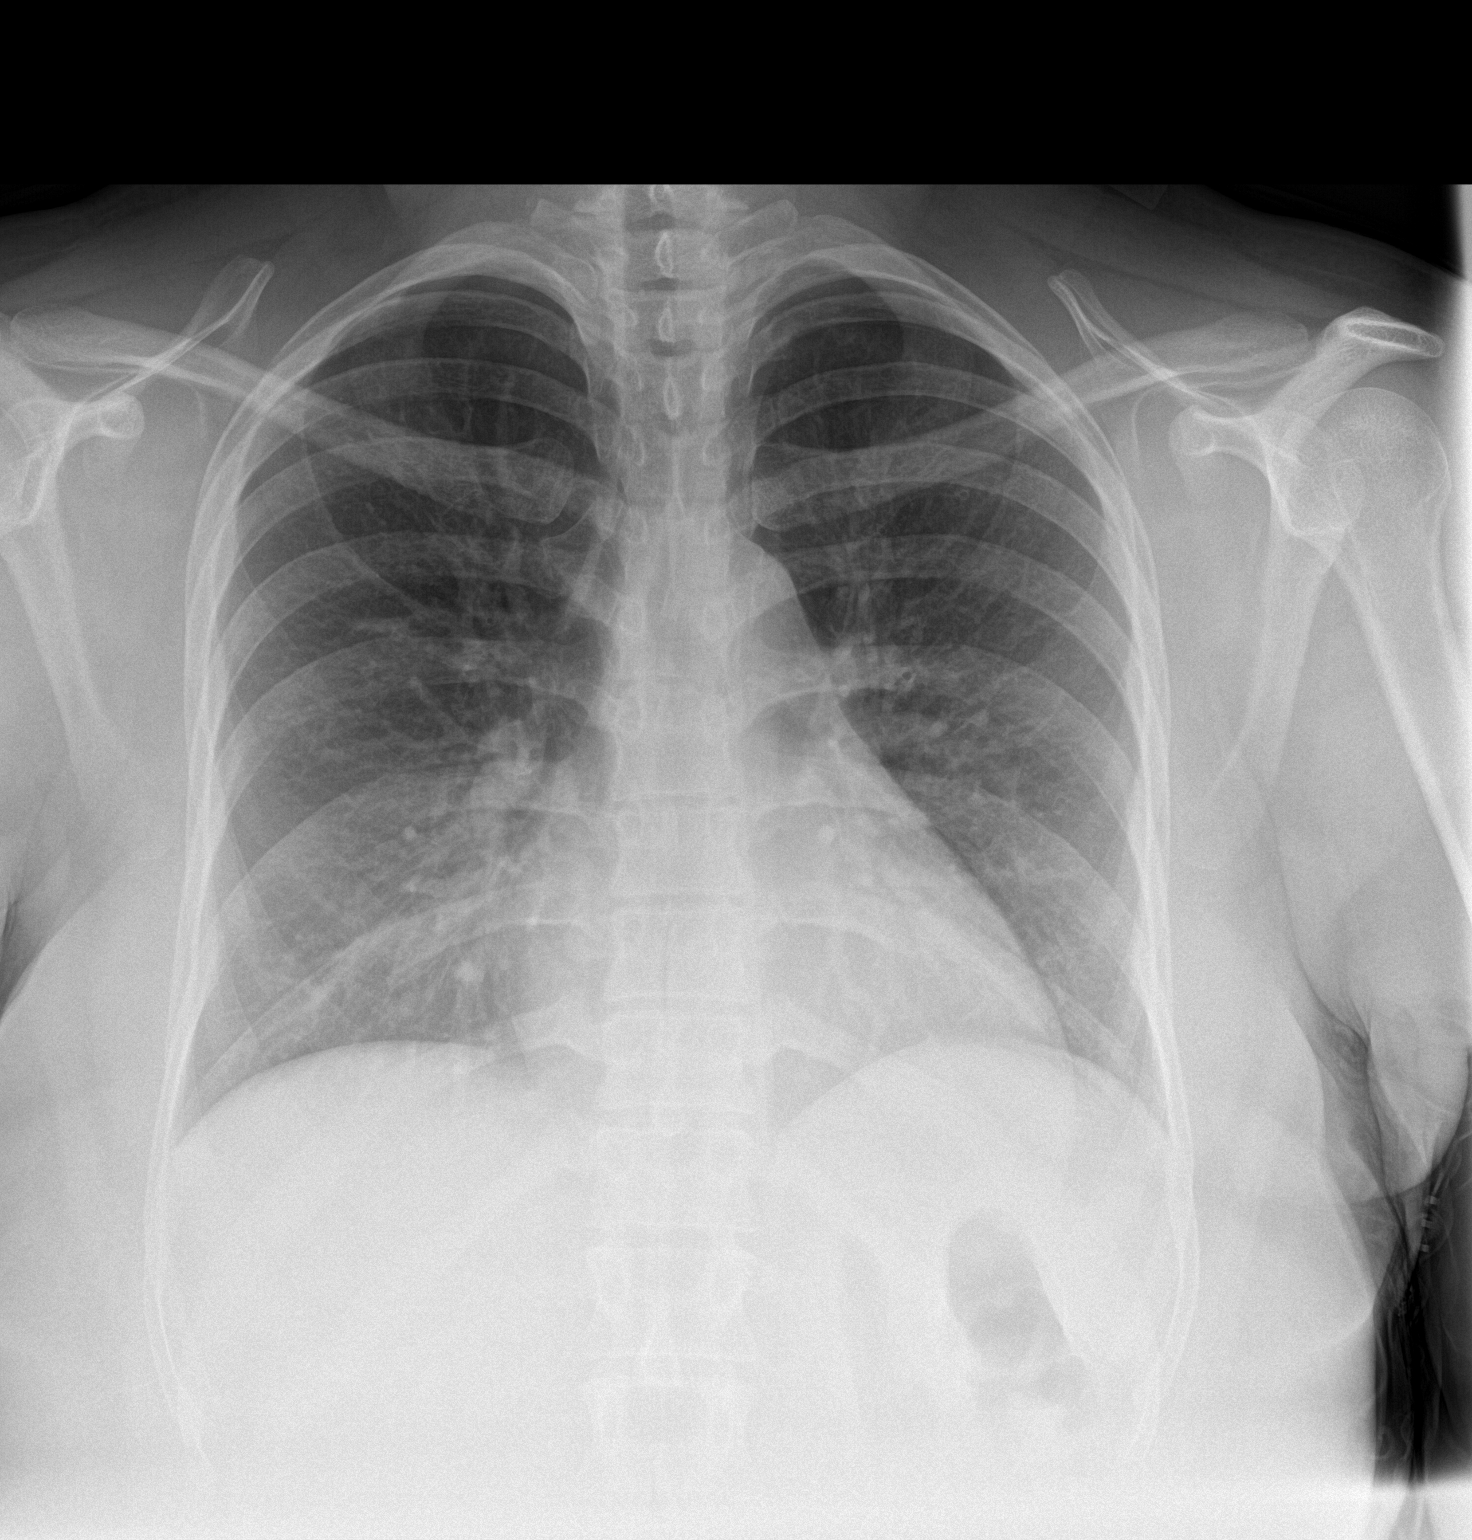

[chest lat]
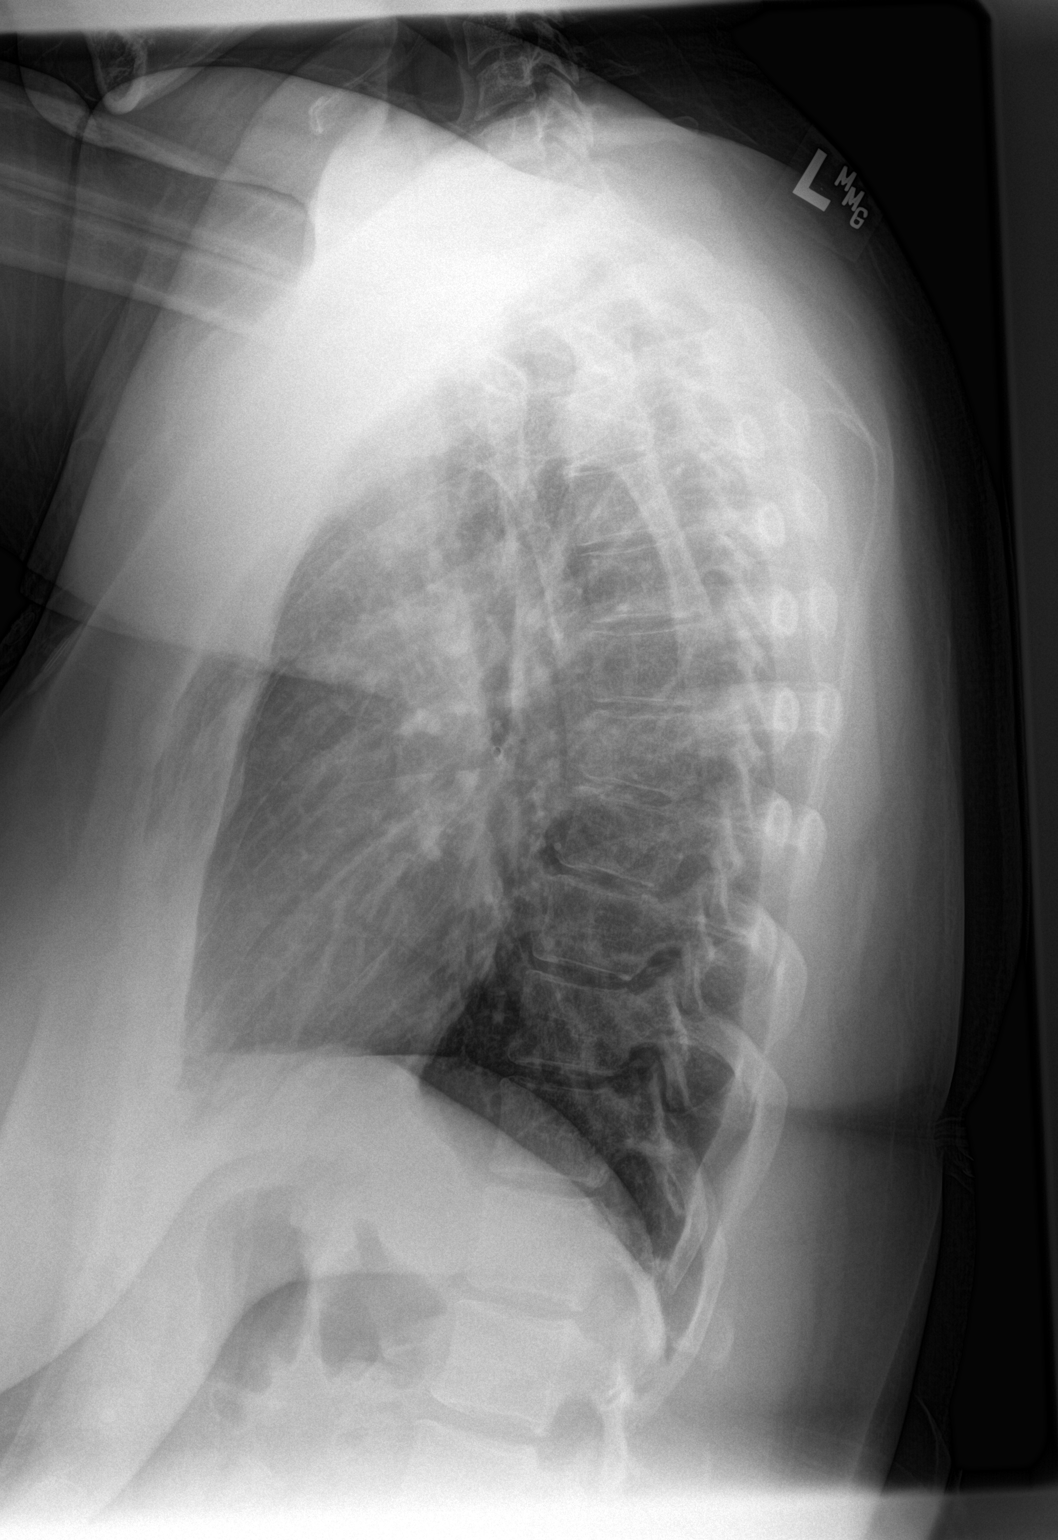

[2 of 2 positions shown; findings below may reference images not displayed]

FINDINGS: Normal heart size and mediastinal contours. No acute infiltrate or
edema. No effusion or pneumothorax. No acute osseous findings.
IMPRESSION: Negative chest.

## 2014-01-19 MED ORDER — LORATADINE 10 MG PO TABS: 10.0000 mg | ORAL_TABLET | Freq: Every day | ORAL | Status: DC

## 2014-01-19 MED ORDER — GUAIFENESIN ER 600 MG PO TB12: 600.0000 mg | ORAL_TABLET | Freq: Two times a day (BID) | ORAL | Status: DC | PRN

## 2014-01-19 MED ORDER — ALBUTEROL SULFATE HFA 108 (90 BASE) MCG/ACT IN AERS: 2.0000 | INHALATION_SPRAY | RESPIRATORY_TRACT | Status: DC | PRN

## 2014-01-19 MED ORDER — PREDNISONE 20 MG PO TABS: ORAL_TABLET | ORAL | Status: DC

## 2014-01-19 MED ORDER — ALBUTEROL SULFATE HFA 108 (90 BASE) MCG/ACT IN AERS
2.0000 | INHALATION_SPRAY | Freq: Once | RESPIRATORY_TRACT | Status: AC
  Administered 2014-01-19: 2 via RESPIRATORY_TRACT
  Filled 2014-01-19: qty 6.7

## 2014-01-19 NOTE — ED Notes (Signed)
Patient with cough and shortness of breath and feeling like she has a tightness in her chest.  Patient does have a history of asthma.  Patient states she doesn't have any inhaler at home.

## 2014-01-19 NOTE — ED Notes (Signed)
Pt states she is breathing much better.

## 2014-01-19 NOTE — ED Provider Notes (Signed)
CSN: [id number]     Arrival date & time 01/19/14  0413 History   None    Chief Complaint  Patient presents with  . Shortness of Breath  . Chest Pain     (Consider location/radiation/quality/duration/timing/severity/associated sxs/prior Treatment) HPI Comments: Jill Clarke is a 28 y.o. female with a PMHx of asthma and sickle cell trait, who presents to the ED with complaints of Chest tightness and shortness of breath 1 day. She reports that this feels very much like her asthma, which always gets exacerbated with weather changes. She states she ran out of her inhaler, which usually takes care of the symptoms, but she was unable to try this before coming to the emergency room. She endorses wheezing and yellowish sputum production with cough. Chest tightness is 8/10 central radiating around her chest constant worse with activity and weather changes and cough, and improved with fluids and rest. She does endorse that she had 2 episodes of posttussive emesis prior to arrival, but denies any abdominal pain or nausea at this time. Endorse mild rhinorrhea and sinus pressure. Denies any ears, chills, claudication, hemoptysis, leg swelling, eye symptoms, ear symptoms, sore throat, diarrhea, constipation, dysuria, hematuria, myalgias, arthralgias, or rashes. Denies any other symptoms at this time, states that she just needs an inhaler. Denies any known sick contacts.   Patient is a 28 y.o. female presenting with shortness of breath. The history is provided by the patient. No language interpreter was used.  Shortness of Breath Severity:  Moderate Onset quality:  Gradual Duration:  1 day Timing:  Constant Progression:  Unchanged Chronicity:  Recurrent Context: URI and weather changes   Relieved by:  Rest Worsened by:  Activity and weather changes Ineffective treatments:  None tried Associated symptoms: cough, sputum production (yellowish mucoid), vomiting (post tussive) and wheezing   Associated  symptoms: no abdominal pain, no chest pain, no claudication, no diaphoresis, no ear pain, no fever, no headaches, no hemoptysis, no rash, no sore throat and no swollen glands   Risk factors: no hx of PE/DVT, no oral contraceptive use, no prolonged immobilization and no recent surgery     Past Medical History  Diagnosis Date  . Unspecified high-risk pregnancy 01/27/2011  . Sickle cell trait   . Asthma     uses inhaler prn  . Gestational diabetes    Past Surgical History  Procedure Laterality Date  . Tonsillectomy    . Wisdom tooth extraction     Family History  Problem Relation Age of Onset  . Sickle cell trait Mother   . Depression Mother   . Asthma Father   . Drug abuse Father     heroin abuse  . Kidney disease Father     due to heroin abuse  . Diabetes Maternal Grandmother    History  Substance Use Topics  . Smoking status: Former Research scientist (life sciences)  . Smokeless tobacco: Never Used  . Alcohol Use: No   OB History    Gravida Para Term Preterm AB TAB SAB Ectopic Multiple Living   2 2 2  0 0 0 0 0 0 2     Review of Systems  Constitutional: Negative for fever, chills and diaphoresis.  HENT: Positive for rhinorrhea and sinus pressure. Negative for ear discharge, ear pain and sore throat.   Eyes: Negative for pain, discharge and redness.  Respiratory: Positive for cough, sputum production (yellowish mucoid), chest tightness, shortness of breath and wheezing. Negative for hemoptysis.   Cardiovascular: Negative for chest pain, claudication and  leg swelling.  Gastrointestinal: Positive for vomiting (post tussive). Negative for nausea, abdominal pain, diarrhea and constipation.  Genitourinary: Negative for dysuria and hematuria.  Musculoskeletal: Negative for myalgias and arthralgias.  Skin: Negative for rash.  Neurological: Negative for dizziness, weakness, light-headedness, numbness and headaches.      Allergies  Review of patient's allergies indicates no known allergies.  Home  Medications   Prior to Admission medications   Medication Sig Start Date End Date Taking? Authorizing Provider  albuterol (PROVENTIL HFA;VENTOLIN HFA) 108 (90 BASE) MCG/ACT inhaler Inhale 1-2 puffs into the lungs every 6 (six) hours as needed for wheezing or shortness of breath. 06/03/12   Rosana Hoes, MD  amoxicillin-clavulanate (AUGMENTIN) 875-125 MG per tablet Take 1 tablet by mouth every 12 (twelve) hours. 02/11/13   Janne Napoleon, NP  calcium carbonate (TUMS - DOSED IN MG ELEMENTAL CALCIUM) 500 MG chewable tablet Chew 2 tablets by mouth 3 (three) times daily as needed. For indigestion.    Historical Provider, MD  ibuprofen (ADVIL,MOTRIN) 600 MG tablet Take 600 mg by mouth every 6 (six) hours as needed. For pain.    Historical Provider, MD  ketoconazole (NIZORAL) 2 % cream Apply 1 application topically daily. 12/15/12   Gwen Pounds, CNM  neomycin-polymyxin-hydrocortisone (CORTISPORIN) 3.5-10000-1 otic suspension Place 3 drops into the left ear every 4 (four) hours. X 5 days 02/11/13   Janne Napoleon, NP  terconazole (TERAZOL 7) 0.4 % vaginal cream Place 1 applicator vaginally at bedtime. 12/15/12   Gwen Pounds, CNM   BP 116/71 mmHg  Pulse 103  Temp(Src) 98.6 F (37 C) (Oral)  Resp 18  Ht 5' (1.524 m)  Wt 164 lb (74.39 kg)  BMI 32.03 kg/m2  SpO2 100% Physical Exam  Constitutional: She is oriented to person, place, and time. Vital signs are normal. She appears well-developed and well-nourished.  Non-toxic appearance. No distress.  Afebrile, nontoxic, NAD  HENT:  Head: Normocephalic and atraumatic.  Right Ear: Hearing, tympanic membrane, external ear and ear canal normal.  Left Ear: Hearing, tympanic membrane, external ear and ear canal normal.  Nose: Nose normal.  Mouth/Throat: Uvula is midline, oropharynx is clear and moist and mucous membranes are normal.  Ears clear bilaterally Nose clear Oropharynx clear  Eyes: Conjunctivae and EOM are normal. Right eye exhibits no discharge. Left  eye exhibits no discharge.  Neck: Normal range of motion. Neck supple.  Cardiovascular: Normal rate, regular rhythm, normal heart sounds and intact distal pulses.  Exam reveals no gallop and no friction rub.   No murmur heard. Pulmonary/Chest: Effort normal. No respiratory distress. She has no decreased breath sounds. She has wheezes (diffuse expiratory). She has no rhonchi. She has no rales. She exhibits tenderness. She exhibits no deformity and no retraction.  Diffuse expiratory wheezing which improved after 4puffs albuterol, chest wall TTP without crepitus or deformity  Abdominal: Soft. Normal appearance and bowel sounds are normal. She exhibits no distension. There is no tenderness. There is no rigidity, no rebound, no guarding and negative Murphy's sign.  Musculoskeletal: Normal range of motion.  Lymphadenopathy:       Head (right side): Submandibular and tonsillar adenopathy present.       Head (left side): Submandibular and tonsillar adenopathy present.    She has cervical adenopathy.  Shotty head/neck LAD which is mildly TTP  Neurological: She is alert and oriented to person, place, and time. She has normal strength. No sensory deficit.  Skin: Skin is warm, dry and intact. No rash noted.  Psychiatric: She has a normal mood and affect.  Nursing note and vitals reviewed.   ED Course  Procedures (including critical care time) Labs Review Labs Reviewed  CBC - Abnormal; Notable for the following:    Platelets 481 (*)    All other components within normal limits  BASIC METABOLIC PANEL - Abnormal; Notable for the following:    Glucose, Bld 100 (*)    All other components within normal limits  I-STAT TROPOININ, ED    Imaging Review Dg Chest 2 View  01/19/2014   CLINICAL DATA:  Shortness of breath with cough and wheeze.  EXAM: CHEST  2 VIEW  COMPARISON:  09/23/2011  FINDINGS: Normal heart size and mediastinal contours. No acute infiltrate or edema. No effusion or pneumothorax. No  acute osseous findings.  IMPRESSION: Negative chest.   Electronically Signed   By: Jorje Guild M.D.   On: 01/19/2014 06:10     EKG Interpretation None    EKG: NSR  MDM   Final diagnoses:  Asthma, unspecified asthma severity, with acute exacerbation  SOB (shortness of breath)  Chest tightness  Costochondritis  URI (upper respiratory infection)    28 y.o. female with chest tightness and shortness of breath, states this feels very similar to her asthma. EKG unremarkable, CBC, BMP, and troponin all negative, chest x-ray negative. Doubt DVT/PE/ACS. Lung exam revealing diffuse expiratory wheezing. Given albuterol inhaler 2 puffs twice with improvement in wheezing. Patient feels much better. Discharged home with inhaler and prednisone taper. Discussed use of claritin and mucinex. Will have her see PCP ([facility]) in 1wk for recheck. I explained the diagnosis and have given explicit precautions to return to the ER including for any other new or worsening symptoms. The patient understands and accepts the medical plan as it's been dictated and I have answered their questions. Discharge instructions concerning home care and prescriptions have been given. The patient is STABLE and is discharged to home in good condition.  BP 116/71 mmHg  Pulse 103  Temp(Src) 98.6 F (37 C) (Oral)  Resp 18  Ht 5' (1.524 m)  Wt 164 lb (74.39 kg)  BMI 32.03 kg/m2  SpO2 100%  Meds ordered this encounter  Medications  . albuterol (PROVENTIL HFA;VENTOLIN HFA) 108 (90 BASE) MCG/ACT inhaler 2 puff    Sig:   . predniSONE (DELTASONE) 20 MG tablet    Sig: 3 tabs po daily x 4 days    Dispense:  12 tablet    Refill:  0    Order Specific Question:  Supervising Provider    Answer:  Noemi Chapel D [[id number]]  . loratadine (CLARITIN) 10 MG tablet    Sig: Take 1 tablet (10 mg total) by mouth daily. One po daily x 5 days    Dispense:  5 tablet    Refill:  0    Order Specific Question:  Supervising Provider     Answer:  Noemi Chapel D [[id number]]  . albuterol (PROVENTIL HFA;VENTOLIN HFA) 108 (90 BASE) MCG/ACT inhaler    Sig: Inhale 2 puffs into the lungs every 2 (two) hours as needed for wheezing or shortness of breath (cough).    Dispense:  1 Inhaler    Refill:  0    Order Specific Question:  Supervising Provider    Answer:  Noemi Chapel D [[id number]]  . guaiFENesin (MUCINEX) 600 MG 12 hr tablet    Sig: Take 1 tablet (600 mg total) by mouth 2 (two) times daily as needed for cough or  to loosen phlegm.    Dispense:  10 tablet    Refill:  0    Order Specific Question:  Supervising Provider    Answer:  Johnna Acosta [address] [place], PA-C 01/19/14 [place] III, MD 01/20/14 [phone]

## 2014-01-19 NOTE — Discharge Instructions (Signed)
Continue to stay well-hydrated. Continue to alternate between Tylenol and Ibuprofen for pain or fever. Use Mucinex for cough suppression/expectoration of mucus. Use netipot and flonase to help with nasal congestion. May consider over-the-counter Benadryl or other antihistamine to decrease secretions and for watery itchy eyes. Use inhaler as directed, as needed for cough/chest congestion. Use prednisone as directed. Followup with your primary care doctor in 5-7 days for recheck of ongoing symptoms. Return to emergency department for emergent changing or worsening of symptoms.   Asthma Asthma is a condition of the lungs in which the airways tighten and narrow. Asthma can make it hard to breathe. Asthma cannot be cured, but medicine and lifestyle changes can help control it. Asthma may be started (triggered) by: 1. Animal skin flakes (dander). 2. Dust. 3. Cockroaches. 4. Pollen. 5. Mold. 6. Smoke. 7. Cleaning products. 8. Hair sprays or aerosol sprays. 9. Paint fumes or strong smells. 10. Cold air, weather changes, and winds. 11. Crying or laughing hard. 12. Stress. 13. Certain medicines or drugs. 14. Foods, such as dried fruit, potato chips, and sparkling grape juice. 15. Infections or conditions (colds, flu). 16. Exercise. 17. Certain medical conditions or diseases. 18. Exercise or tiring activities. HOME CARE  1. Take medicine as told by your doctor. 2. Use a peak flow meter as told by your doctor. A peak flow meter is a tool that measures how well the lungs are working. 3. Record and keep track of the peak flow meter's readings. 4. Understand and use the asthma action plan. An asthma action plan is a written plan for taking care of your asthma and treating your attacks. 5. To help prevent asthma attacks: 1. Do not smoke. Stay away from secondhand smoke. 2. Change your heating and air conditioning filter often. 3. Limit your use of fireplaces and wood stoves. 4. Get rid of pests (such  as roaches and mice) and their droppings. 5. Throw away plants if you see mold on them. 6. Clean your floors. Dust regularly. Use cleaning products that do not smell. 7. Have someone vacuum when you are not home. Use a vacuum cleaner with a HEPA filter if possible. 8. Replace carpet with wood, tile, or vinyl flooring. Carpet can trap animal skin flakes and dust. 9. Use allergy-proof pillows, mattress covers, and box spring covers. 10. Wash bed sheets and blankets every week in hot water and dry them in a dryer. 11. Use blankets that are made of polyester or cotton. 12. Clean bathrooms and kitchens with bleach. If possible, have someone repaint the walls in these rooms with mold-resistant paint. Keep out of the rooms that are being cleaned and painted. 13. Wash hands often. GET HELP IF:  You have make a whistling sound when breaking (wheeze), have shortness of breath, or have a cough even if taking medicine to prevent attacks.  The colored mucus you cough up (sputum) is thicker than usual.  The colored mucus you cough up changes from clear or white to yellow, green, gray, or bloody.  You have problems from the medicine you are taking such as:  A rash.  Itching.  Swelling.  Trouble breathing.  You need reliever medicines more than 2-3 times a week.  Your peak flow measurement is still at 50-79% of your personal best after following the action plan for 1 hour.  You have a fever. GET HELP RIGHT AWAY IF:   You seem to be worse and are not responding to medicine during an asthma attack.  You  are short of breath even at rest.  You get short of breath when doing very little activity.  You have trouble eating, drinking, or talking.  You have chest pain.  You have a fast heartbeat.  Your lips or fingernails start to turn blue.  You are light-headed, dizzy, or faint.  Your peak flow is less than 50% of your personal best. MAKE SURE YOU:   Understand these  instructions.  Will watch your condition.  Will get help right away if you are not doing well or get worse. Document Released: 06/28/2007 Document Revised: 05/26/2013 Document Reviewed: 08/08/2012 [facility] Patient Information 2015 [place], [place]. This information is not intended to replace advice given to you by your health care provider. Make sure you discuss any questions you have with your health care provider.  Costochondritis Costochondritis, sometimes called Tietze syndrome, is a swelling and irritation (inflammation) of the tissue (cartilage) that connects your ribs with your breastbone (sternum). It causes pain in the chest and rib area. Costochondritis usually goes away on its own over time. It can take up to 6 weeks or longer to get better, especially if you are unable to limit your activities. CAUSES  Some cases of costochondritis have no known cause. Possible causes include: 19. Injury (trauma). 20. Exercise or activity such as lifting. 21. Severe coughing. SIGNS AND SYMPTOMS 6. Pain and tenderness in the chest and rib area. 7. Pain that gets worse when coughing or taking deep breaths. 8. Pain that gets worse with specific movements. DIAGNOSIS  Your health care provider will do a physical exam and ask about your symptoms. Chest X-rays or other tests may be done to rule out other problems. TREATMENT  Costochondritis usually goes away on its own over time. Your health care provider may prescribe medicine to help relieve pain. HOME CARE INSTRUCTIONS   Avoid exhausting physical activity. Try not to strain your ribs during normal activity. This would include any activities using chest, abdominal, and side muscles, especially if heavy weights are used.  Apply ice to the affected area for the first 2 days after the pain begins.  Put ice in a plastic bag.  Place a towel between your skin and the bag.  Leave the ice on for 20 minutes, 2-3 times a day.  Only take  over-the-counter or prescription medicines as directed by your health care provider. SEEK MEDICAL CARE IF:  You have redness or swelling at the rib joints. These are signs of infection.  Your pain does not go away despite rest or medicine. SEEK IMMEDIATE MEDICAL CARE IF:   Your pain increases or you are very uncomfortable.  You have shortness of breath or difficulty breathing.  You cough up blood.  You have worse chest pains, sweating, or vomiting.  You have a fever or persistent symptoms for more than 2-3 days.  You have a fever and your symptoms suddenly get worse. MAKE SURE YOU:   Understand these instructions.  Will watch your condition.  Will get help right away if you are not doing well or get worse. Document Released: 10/19/2004 Document Revised: 10/30/2012 Document Reviewed: 08/13/2012 [facility] Patient Information 2015 [place], [place]. This information is not intended to replace advice given to you by your health care provider. Make sure you discuss any questions you have with your health care provider.  How to Use an Inhaler Proper inhaler technique is very important. Good technique ensures that the medicine reaches the lungs. Poor technique results in depositing the medicine on the  tongue and back of the throat rather than in the airways. If you do not use the inhaler with good technique, the medicine will not help you. STEPS TO FOLLOW IF USING AN INHALER WITHOUT AN EXTENSION TUBE 22. Remove the cap from the inhaler. 23. If you are using the inhaler for the first time, you will need to prime it. Shake the inhaler for 5 seconds and release four puffs into the air, away from your face. Ask your health care provider or pharmacist if you have questions about priming your inhaler. 24. Shake the inhaler for 5 seconds before each breath in (inhalation). 25. Position the inhaler so that the top of the canister faces up. 26. Put your index finger on the top of the medicine  canister. Your thumb supports the bottom of the inhaler. 27. Open your mouth. 28. Either place the inhaler between your teeth and place your lips tightly around the mouthpiece, or hold the inhaler 1-2 inches away from your open mouth. If you are unsure of which technique to use, ask your health care provider. 29. Breathe out (exhale) normally and as completely as possible. 30. Press the canister down with your index finger to release the medicine. 31. At the same time as the canister is pressed, inhale deeply and slowly until your lungs are completely filled. This should take 4-6 seconds. Keep your tongue down. 32. Hold the medicine in your lungs for 5-10 seconds (10 seconds is best). This helps the medicine get into the small airways of your lungs. 33. Breathe out slowly, through pursed lips. Whistling is an example of pursed lips. 34. Wait at least 15-30 seconds between puffs. Continue with the above steps until you have taken the number of puffs your health care provider has ordered. Do not use the inhaler more than your health care provider tells you. 35. Replace the cap on the inhaler. 36. Follow the directions from your health care provider or the inhaler insert for cleaning the inhaler. STEPS TO FOLLOW IF USING AN INHALER WITH AN EXTENSION (SPACER) 9. Remove the cap from the inhaler. 10. If you are using the inhaler for the first time, you will need to prime it. Shake the inhaler for 5 seconds and release four puffs into the air, away from your face. Ask your health care provider or pharmacist if you have questions about priming your inhaler. 11. Shake the inhaler for 5 seconds before each breath in (inhalation). 12. Place the open end of the spacer onto the mouthpiece of the inhaler. 13. Position the inhaler so that the top of the canister faces up and the spacer mouthpiece faces you. 14. Put your index finger on the top of the medicine canister. Your thumb supports the bottom of the inhaler  and the spacer. 15. Breathe out (exhale) normally and as completely as possible. 16. Immediately after exhaling, place the spacer between your teeth and into your mouth. Close your lips tightly around the spacer. 17. Press the canister down with your index finger to release the medicine. 18. At the same time as the canister is pressed, inhale deeply and slowly until your lungs are completely filled. This should take 4-6 seconds. Keep your tongue down and out of the way. 19. Hold the medicine in your lungs for 5-10 seconds (10 seconds is best). This helps the medicine get into the small airways of your lungs. Exhale. 20. Repeat inhaling deeply through the spacer mouthpiece. Again hold that breath for up to 10 seconds (10  seconds is best). Exhale slowly. If it is difficult to take this second deep breath through the spacer, breathe normally several times through the spacer. Remove the spacer from your mouth. 21. Wait at least 15-30 seconds between puffs. Continue with the above steps until you have taken the number of puffs your health care provider has ordered. Do not use the inhaler more than your health care provider tells you. 22. Remove the spacer from the inhaler, and place the cap on the inhaler. 23. Follow the directions from your health care provider or the inhaler insert for cleaning the inhaler and spacer. If you are using different kinds of inhalers, use your quick relief medicine to open the airways 10-15 minutes before using a steroid if instructed to do so by your health care provider. If you are unsure which inhalers to use and the order of using them, ask your health care provider, nurse, or respiratory therapist. If you are using a steroid inhaler, always rinse your mouth with water after your last puff, then gargle and spit out the water. Do not swallow the water. AVOID:  Inhaling before or after starting the spray of medicine. It takes practice to coordinate your breathing with  triggering the spray.  Inhaling through the nose (rather than the mouth) when triggering the spray. HOW TO DETERMINE IF YOUR INHALER IS FULL OR NEARLY EMPTY You cannot know when an inhaler is empty by shaking it. A few inhalers are now being made with dose counters. Ask your health care provider for a prescription that has a dose counter if you feel you need that extra help. If your inhaler does not have a counter, ask your health care provider to help you determine the date you need to refill your inhaler. Write the refill date on a calendar or your inhaler canister. Refill your inhaler 7-10 days before it runs out. Be sure to keep an adequate supply of medicine. This includes making sure it is not expired, and that you have a spare inhaler.  SEEK MEDICAL CARE IF:   Your symptoms are only partially relieved with your inhaler.  You are having trouble using your inhaler.  You have some increase in phlegm. SEEK IMMEDIATE MEDICAL CARE IF:   You feel little or no relief with your inhalers. You are still wheezing and are feeling shortness of breath or tightness in your chest or both.  You have dizziness, headaches, or a fast heart rate.  You have chills, fever, or night sweats.  You have a noticeable increase in phlegm production, or there is blood in the phlegm. MAKE SURE YOU:   Understand these instructions.  Will watch your condition.  Will get help right away if you are not doing well or get worse. Document Released: 01/07/2000 Document Revised: 10/30/2012 Document Reviewed: 08/08/2012 [facility] Patient Information 2015 [place], [place]. This information is not intended to replace advice given to you by your health care provider. Make sure you discuss any questions you have with your health care provider.

## 2014-05-19 ENCOUNTER — Encounter (HOSPITAL_COMMUNITY): Payer: Self-pay | Admitting: Emergency Medicine

## 2014-05-19 ENCOUNTER — Emergency Department (HOSPITAL_COMMUNITY)
Admission: EM | Admit: 2014-05-19 | Discharge: 2014-05-19 | Disposition: A | Discharge status: Home / Self Care | Payer: Medicaid Other | Source: Home / Self Care | Attending: Family Medicine | Admitting: Family Medicine

## 2014-05-19 DIAGNOSIS — B86 Scabies: Secondary | ICD-10-CM | POA: Diagnosis not present

## 2014-05-19 MED ORDER — PERMETHRIN 5 % EX CREA: TOPICAL_CREAM | CUTANEOUS | Status: DC

## 2014-05-19 NOTE — ED Notes (Signed)
C/o rash on bilateral hands for a week now States clear pus discharge is coming from rash States hands does itch, swollen and burning  Used shea, coco butter as tx

## 2014-05-19 NOTE — ED Provider Notes (Signed)
CSN: [id number]     Arrival date & time 05/19/14  0906 History   First MD Initiated Contact with Patient 05/19/14 1014     Chief Complaint  Patient presents with  . Rash   (Consider location/radiation/quality/duration/timing/severity/associated sxs/prior Treatment) Patient is a 29 y.o. female presenting with rash. The history is provided by the patient.  Rash Location:  Hand (both hands) Quality: draining, dryness, itchiness and weeping   Quality comment:  +papular Severity:  Moderate Onset quality:  Gradual Duration:  1 week Timing:  Constant Progression:  Worsening Chronicity:  New Associated symptoms comment:  None   Past Medical History  Diagnosis Date  . Unspecified high-risk pregnancy 01/27/2011  . Sickle cell trait   . Asthma     uses inhaler prn  . Gestational diabetes    Past Surgical History  Procedure Laterality Date  . Tonsillectomy    . Wisdom tooth extraction     Family History  Problem Relation Age of Onset  . Sickle cell trait Mother   . Depression Mother   . Asthma Father   . Drug abuse Father     heroin abuse  . Kidney disease Father     due to heroin abuse  . Diabetes Maternal Grandmother    History  Substance Use Topics  . Smoking status: Former Research scientist (life sciences)  . Smokeless tobacco: Never Used  . Alcohol Use: No   OB History    Gravida Para Term Preterm AB TAB SAB Ectopic Multiple Living   2 2 2  0 0 0 0 0 0 2     Review of Systems  Skin: Positive for rash.  All other systems reviewed and are negative.   Allergies  Review of patient's allergies indicates no known allergies.  Home Medications   Prior to Admission medications   Medication Sig Start Date End Date Taking? Authorizing Provider  albuterol (PROVENTIL HFA;VENTOLIN HFA) 108 (90 BASE) MCG/ACT inhaler Inhale 1-2 puffs into the lungs every 6 (six) hours as needed for wheezing or shortness of breath. 06/03/12   Rosana Hoes, MD  albuterol (PROVENTIL HFA;VENTOLIN HFA) 108 (90 BASE) MCG/ACT  inhaler Inhale 2 puffs into the lungs every 2 (two) hours as needed for wheezing or shortness of breath (cough). 01/19/14   Mercedes Camprubi-Soms, PA-C  amoxicillin-clavulanate (AUGMENTIN) 875-125 MG per tablet Take 1 tablet by mouth every 12 (twelve) hours. 02/11/13   Janne Napoleon, NP  calcium carbonate (TUMS - DOSED IN MG ELEMENTAL CALCIUM) 500 MG chewable tablet Chew 2 tablets by mouth 3 (three) times daily as needed. For indigestion.    Historical Provider, MD  guaiFENesin (MUCINEX) 600 MG 12 hr tablet Take 1 tablet (600 mg total) by mouth 2 (two) times daily as needed for cough or to loosen phlegm. 01/19/14   Mercedes Camprubi-Soms, PA-C  ibuprofen (ADVIL,MOTRIN) 600 MG tablet Take 600 mg by mouth every 6 (six) hours as needed. For pain.    Historical Provider, MD  ketoconazole (NIZORAL) 2 % cream Apply 1 application topically daily. 12/15/12   Gwen Pounds, CNM  loratadine (CLARITIN) 10 MG tablet Take 1 tablet (10 mg total) by mouth daily. One po daily x 5 days 01/19/14   Mercedes Camprubi-Soms, PA-C  neomycin-polymyxin-hydrocortisone (CORTISPORIN) 3.5-10000-1 otic suspension Place 3 drops into the left ear every 4 (four) hours. X 5 days 02/11/13   Janne Napoleon, NP  permethrin (ELIMITE) 5 % cream Apply to affected area once, leave for 8 hours then shower 05/19/14   Lutricia Feil,  PA  predniSONE (DELTASONE) 20 MG tablet 3 tabs po daily x 4 days 01/19/14   Mercedes Camprubi-Soms, PA-C  terconazole (TERAZOL 7) 0.4 % vaginal cream Place 1 applicator vaginally at bedtime. 12/15/12   Gwen Pounds, CNM   BP 120/87 mmHg  Pulse 89  Temp(Src) 98.1 F (36.7 C) (Oral)  Resp 16  SpO2 99% Physical Exam  Constitutional: She is oriented to person, place, and time. She appears well-developed and well-nourished. No distress.  HENT:  Head: Normocephalic and atraumatic.  Mouth/Throat: Oropharynx is clear and moist.  Eyes: Conjunctivae are normal.  Cardiovascular: Normal rate.   Pulmonary/Chest:  Effort normal.  Musculoskeletal: Normal range of motion.  Neurological: She is alert and oriented to person, place, and time.  Skin: Skin is warm and dry. Rash noted.  Skin colored papular rash covering hands and in between digits. Multiple areas of excoriation  Psychiatric: She has a normal mood and affect. Her behavior is normal.  Nursing note and vitals reviewed.   ED Course  Procedures (including critical care time) Labs Review Labs Reviewed - No data to display  Imaging Review No results found.   MDM   1. Scabies   Elimite as directed. Monitor family members for symptoms Return prn    Lutricia Feil, [place] 05/19/14 1059

## 2014-05-19 NOTE — Discharge Instructions (Signed)

## 2014-05-22 ENCOUNTER — Encounter (HOSPITAL_COMMUNITY): Payer: Self-pay | Admitting: Emergency Medicine

## 2014-05-22 ENCOUNTER — Emergency Department (HOSPITAL_COMMUNITY)
Admission: EM | Admit: 2014-05-22 | Discharge: 2014-05-22 | Disposition: A | Discharge status: Home / Self Care | Payer: Medicaid Other | Attending: Emergency Medicine | Admitting: Emergency Medicine

## 2014-05-22 DIAGNOSIS — Z7952 Long term (current) use of systemic steroids: Secondary | ICD-10-CM | POA: Insufficient documentation

## 2014-05-22 DIAGNOSIS — L259 Unspecified contact dermatitis, unspecified cause: Secondary | ICD-10-CM | POA: Insufficient documentation

## 2014-05-22 DIAGNOSIS — Z862 Personal history of diseases of the blood and blood-forming organs and certain disorders involving the immune mechanism: Secondary | ICD-10-CM | POA: Diagnosis not present

## 2014-05-22 DIAGNOSIS — L089 Local infection of the skin and subcutaneous tissue, unspecified: Secondary | ICD-10-CM | POA: Diagnosis not present

## 2014-05-22 DIAGNOSIS — Z8632 Personal history of gestational diabetes: Secondary | ICD-10-CM | POA: Diagnosis not present

## 2014-05-22 DIAGNOSIS — Z79899 Other long term (current) drug therapy: Secondary | ICD-10-CM | POA: Insufficient documentation

## 2014-05-22 DIAGNOSIS — Z792 Long term (current) use of antibiotics: Secondary | ICD-10-CM | POA: Insufficient documentation

## 2014-05-22 DIAGNOSIS — J45909 Unspecified asthma, uncomplicated: Secondary | ICD-10-CM | POA: Diagnosis not present

## 2014-05-22 DIAGNOSIS — Z87891 Personal history of nicotine dependence: Secondary | ICD-10-CM | POA: Insufficient documentation

## 2014-05-22 DIAGNOSIS — R21 Rash and other nonspecific skin eruption: Secondary | ICD-10-CM | POA: Diagnosis present

## 2014-05-22 MED ORDER — PREDNISONE 10 MG PO TABS: ORAL_TABLET | ORAL | Status: DC

## 2014-05-22 MED ORDER — CEPHALEXIN 500 MG PO CAPS: 500.0000 mg | ORAL_CAPSULE | Freq: Four times a day (QID) | ORAL | Status: DC

## 2014-05-22 NOTE — Discharge Instructions (Signed)
Contact Dermatitis °Contact dermatitis is a rash that happens when something touches the skin. You touched something that irritates your skin, or you have allergies to something you touched. °HOME CARE  °· Avoid the thing that caused your rash. °· Keep your rash away from hot water, soap, sunlight, chemicals, and other things that might bother it. °· Do not scratch your rash. °· You can take cool baths to help stop itching. °· Only take medicine as told by your doctor. °· Keep all doctor visits as told. °GET HELP RIGHT AWAY IF:  °· Your rash is not better after 3 days. °· Your rash gets worse. °· Your rash is puffy (swollen), tender, red, sore, or warm. °· You have problems with your medicine. °MAKE SURE YOU:  °· Understand these instructions. °· Will watch your condition. °· Will get help right away if you are not doing well or get worse. °Document Released: 11/06/2008 Document Revised: 04/03/2011 Document Reviewed: 06/14/2010 °[employer]® Patient Information ©2015 [employer]. This information is not intended to replace advice given to you by your health care provider. Make sure you discuss any questions you have with your health care provider. ° °

## 2014-05-22 NOTE — ED Provider Notes (Signed)
CSN: [id number]     Arrival date & time 05/22/14  1001 History  This chart was scribed for non-physician practitioner, Glendell Docker, NP, working with Daleen Bo, MD by Ladene Artist, ED Scribe. This patient was seen in room TR08C/TR08C and the patient's care was started at 10:21 AM.   No chief complaint on file.  The history is provided by the patient. No language interpreter was used.   HPI Comments: Jill Clarke is a 29 y.o. female who presents to the Emergency Department complaining of gradually worsening, itchy rash to bilateral hands since last week. She reports 5/10 pain to her hands that is exacerbated with bending. Pt reports similar rash behind her ears that has resolved. She reports recently doing yard work. Pt denies wearing gloves for her job, new lotions, soaps or detergents. Pt was seen on 05/19/14 and diagnosed with scabies. She was started on Elimite cream which she suspects has caused rash to spread. No known allergies.   Past Medical History  Diagnosis Date  . Unspecified high-risk pregnancy 01/27/2011  . Sickle cell trait   . Asthma     uses inhaler prn  . Gestational diabetes    Past Surgical History  Procedure Laterality Date  . Tonsillectomy    . Wisdom tooth extraction     Family History  Problem Relation Age of Onset  . Sickle cell trait Mother   . Depression Mother   . Asthma Father   . Drug abuse Father     heroin abuse  . Kidney disease Father     due to heroin abuse  . Diabetes Maternal Grandmother    History  Substance Use Topics  . Smoking status: Former Research scientist (life sciences)  . Smokeless tobacco: Never Used  . Alcohol Use: No   OB History    Gravida Para Term Preterm AB TAB SAB Ectopic Multiple Living   2 2 2  0 0 0 0 0 0 2     Review of Systems  Skin: Positive for rash.  All other systems reviewed and are negative.  Allergies  Review of patient's allergies indicates no known allergies.  Home Medications   Prior to Admission medications    Medication Sig Start Date End Date Taking? Authorizing Provider  albuterol (PROVENTIL HFA;VENTOLIN HFA) 108 (90 BASE) MCG/ACT inhaler Inhale 1-2 puffs into the lungs every 6 (six) hours as needed for wheezing or shortness of breath. 06/03/12   Rosana Hoes, MD  albuterol (PROVENTIL HFA;VENTOLIN HFA) 108 (90 BASE) MCG/ACT inhaler Inhale 2 puffs into the lungs every 2 (two) hours as needed for wheezing or shortness of breath (cough). 01/19/14   Mercedes Camprubi-Soms, PA-C  amoxicillin-clavulanate (AUGMENTIN) 875-125 MG per tablet Take 1 tablet by mouth every 12 (twelve) hours. 02/11/13   Janne Napoleon, NP  calcium carbonate (TUMS - DOSED IN MG ELEMENTAL CALCIUM) 500 MG chewable tablet Chew 2 tablets by mouth 3 (three) times daily as needed. For indigestion.    Historical Provider, MD  guaiFENesin (MUCINEX) 600 MG 12 hr tablet Take 1 tablet (600 mg total) by mouth 2 (two) times daily as needed for cough or to loosen phlegm. 01/19/14   Mercedes Camprubi-Soms, PA-C  ibuprofen (ADVIL,MOTRIN) 600 MG tablet Take 600 mg by mouth every 6 (six) hours as needed. For pain.    Historical Provider, MD  ketoconazole (NIZORAL) 2 % cream Apply 1 application topically daily. 12/15/12   Gwen Pounds, CNM  loratadine (CLARITIN) 10 MG tablet Take 1 tablet (10 mg total) by mouth  daily. One po daily x 5 days 01/19/14   Mercedes Camprubi-Soms, PA-C  neomycin-polymyxin-hydrocortisone (CORTISPORIN) 3.5-10000-1 otic suspension Place 3 drops into the left ear every 4 (four) hours. X 5 days 02/11/13   Janne Napoleon, NP  permethrin (ELIMITE) 5 % cream Apply to affected area once, leave for 8 hours then shower 05/19/14   Lutricia Feil, PA  predniSONE (DELTASONE) 20 MG tablet 3 tabs po daily x 4 days 01/19/14   Mercedes Camprubi-Soms, PA-C  terconazole (TERAZOL 7) 0.4 % vaginal cream Place 1 applicator vaginally at bedtime. 12/15/12   Gwen Pounds, CNM   BP 118/71 mmHg  Pulse 87  Temp(Src) 98.6 F (37 C) (Oral)  Resp 17   SpO2 99% Physical Exam  Constitutional: She is oriented to person, place, and time. She appears well-developed and well-nourished. No distress.  HENT:  Head: Normocephalic and atraumatic.  Eyes: Conjunctivae and EOM are normal.  Neck: Neck supple. No tracheal deviation present.  Cardiovascular: Normal rate.   Pulmonary/Chest: Effort normal. No respiratory distress.  Musculoskeletal: Normal range of motion.  Neurological: She is alert and oriented to person, place, and time.  Skin: Skin is warm and dry.  Vesicular rash to the hands bilaterally. Some areas to are crusted and drainage. No warmth noted.  Psychiatric: She has a normal mood and affect. Her behavior is normal.  Nursing note and vitals reviewed.  ED Course  Procedures (including critical care time) DIAGNOSTIC STUDIES: Oxygen Saturation is 99% on RA, normal by my interpretation.    COORDINATION OF CARE: 10:27 AM-Discussed treatment plan which includes Prednisone and Keflex with pt at bedside and pt agreed to plan.   Labs Review Labs Reviewed - No data to display  Imaging Review No results found.   EKG Interpretation None      MDM   Final diagnoses:  Contact dermatitis  Skin infection    Discussed with pt return precautions. Rash consistent with contact dermatitis with areas of infection  I personally performed the services described in this documentation, which was scribed in my presence. The recorded information has been reviewed and is accurate.    Glendell Docker, NP 05/22/14 [place], MD 05/22/14 1630

## 2014-05-22 NOTE — ED Notes (Signed)
Rash on hands; areas on right thumb and right pointer finger looks cellulitic.

## 2014-05-22 NOTE — ED Notes (Signed)
PT ambulated with baseline gait; VSS; A&Ox3; no signs of distress; respirations even and unlabored; skin warm and dry; no questions upon discharge.  

## 2014-06-03 ENCOUNTER — Encounter (HOSPITAL_COMMUNITY): Payer: Self-pay | Admitting: *Deleted

## 2014-06-03 ENCOUNTER — Emergency Department (HOSPITAL_COMMUNITY)
Admission: EM | Admit: 2014-06-03 | Discharge: 2014-06-04 | Disposition: A | Discharge status: Home / Self Care | Payer: Medicaid Other | Attending: Emergency Medicine | Admitting: Emergency Medicine

## 2014-06-03 DIAGNOSIS — R21 Rash and other nonspecific skin eruption: Secondary | ICD-10-CM | POA: Diagnosis not present

## 2014-06-03 DIAGNOSIS — Z8632 Personal history of gestational diabetes: Secondary | ICD-10-CM | POA: Insufficient documentation

## 2014-06-03 DIAGNOSIS — Z862 Personal history of diseases of the blood and blood-forming organs and certain disorders involving the immune mechanism: Secondary | ICD-10-CM | POA: Diagnosis not present

## 2014-06-03 DIAGNOSIS — Z79899 Other long term (current) drug therapy: Secondary | ICD-10-CM | POA: Insufficient documentation

## 2014-06-03 DIAGNOSIS — J45909 Unspecified asthma, uncomplicated: Secondary | ICD-10-CM | POA: Insufficient documentation

## 2014-06-03 DIAGNOSIS — Z87891 Personal history of nicotine dependence: Secondary | ICD-10-CM | POA: Diagnosis not present

## 2014-06-03 DIAGNOSIS — Z792 Long term (current) use of antibiotics: Secondary | ICD-10-CM | POA: Insufficient documentation

## 2014-06-03 MED ORDER — TERBINAFINE HCL 1 % EX CREA: 1.0000 "application " | TOPICAL_CREAM | Freq: Two times a day (BID) | CUTANEOUS | Status: DC

## 2014-06-03 NOTE — Discharge Instructions (Signed)
Take the prescribed medication as directed. Follow-up with dermatology. Return to the ED for new or worsening symptoms.

## 2014-06-03 NOTE — ED Notes (Signed)
The pt has had a rash on both hands for several weeks.  She has been seen here  And [facility] for the same

## 2014-06-03 NOTE — ED Notes (Signed)
Pt sts she did some research online and feels that we are treating her inappropriately.  Pt believes she has a fungus on her hands "like athletes foot".  Pt sts if it's not fungus she feels it is "dishidrosis".

## 2014-06-03 NOTE — ED Provider Notes (Signed)
CSN: [id number]     Arrival date & time 06/03/14  2159 History   First MD Initiated Contact with Patient 06/03/14 2304     Chief Complaint  Patient presents with  . Rash     (Consider location/radiation/quality/duration/timing/severity/associated sxs/prior Treatment) Patient is a 29 y.o. female presenting with rash. The history is provided by the patient and medical records.  Rash   This is a 29 y.o. F with PMH significant for sickle cell trait, asthma, presenting to the ED for rash of bilateral hands.  This is patient's 3rd visit for the same without improvement.  She has been treated for scabies as well as contact dermatitis with superimposed infection without improvement. She denies any changes in soaps, detergents, or other personal care products. No fever or chills. No lesions on her palms or soles of her feet.  Patient states she googled online and thinks that she may have fungal infection of her hand.  She states she has had this on her feet in the past and is concerned for the same.  Past Medical History  Diagnosis Date  . Unspecified high-risk pregnancy 01/27/2011  . Sickle cell trait   . Asthma     uses inhaler prn  . Gestational diabetes    Past Surgical History  Procedure Laterality Date  . Tonsillectomy    . Wisdom tooth extraction     Family History  Problem Relation Age of Onset  . Sickle cell trait Mother   . Depression Mother   . Asthma Father   . Drug abuse Father     heroin abuse  . Kidney disease Father     due to heroin abuse  . Diabetes Maternal Grandmother    History  Substance Use Topics  . Smoking status: Former Research scientist (life sciences)  . Smokeless tobacco: Never Used  . Alcohol Use: No   OB History    Gravida Para Term Preterm AB TAB SAB Ectopic Multiple Living   2 2 2  0 0 0 0 0 0 2     Review of Systems  Skin: Positive for rash.  All other systems reviewed and are negative.     Allergies  Review of patient's allergies indicates no known  allergies.  Home Medications   Prior to Admission medications   Medication Sig Start Date End Date Taking? Authorizing Provider  albuterol (PROVENTIL HFA;VENTOLIN HFA) 108 (90 BASE) MCG/ACT inhaler Inhale 1-2 puffs into the lungs every 6 (six) hours as needed for wheezing or shortness of breath. 06/03/12   Rosana Hoes, MD  albuterol (PROVENTIL HFA;VENTOLIN HFA) 108 (90 BASE) MCG/ACT inhaler Inhale 2 puffs into the lungs every 2 (two) hours as needed for wheezing or shortness of breath (cough). 01/19/14   Mercedes Camprubi-Soms, PA-C  amoxicillin-clavulanate (AUGMENTIN) 875-125 MG per tablet Take 1 tablet by mouth every 12 (twelve) hours. 02/11/13   Janne Napoleon, NP  calcium carbonate (TUMS - DOSED IN MG ELEMENTAL CALCIUM) 500 MG chewable tablet Chew 2 tablets by mouth 3 (three) times daily as needed. For indigestion.    Historical Provider, MD  cephALEXin (KEFLEX) 500 MG capsule Take 1 capsule (500 mg total) by mouth 4 (four) times daily. 05/22/14   Glendell Docker, NP  guaiFENesin (MUCINEX) 600 MG 12 hr tablet Take 1 tablet (600 mg total) by mouth 2 (two) times daily as needed for cough or to loosen phlegm. 01/19/14   Mercedes Camprubi-Soms, PA-C  ibuprofen (ADVIL,MOTRIN) 600 MG tablet Take 600 mg by mouth every 6 (six) hours as needed.  For pain.    Historical Provider, MD  ketoconazole (NIZORAL) 2 % cream Apply 1 application topically daily. 12/15/12   Gwen Pounds, CNM  loratadine (CLARITIN) 10 MG tablet Take 1 tablet (10 mg total) by mouth daily. One po daily x 5 days 01/19/14   Mercedes Camprubi-Soms, PA-C  neomycin-polymyxin-hydrocortisone (CORTISPORIN) 3.5-10000-1 otic suspension Place 3 drops into the left ear every 4 (four) hours. X 5 days 02/11/13   Janne Napoleon, NP  permethrin (ELIMITE) 5 % cream Apply to affected area once, leave for 8 hours then shower 05/19/14   Lutricia Feil, PA  predniSONE (DELTASONE) 10 MG tablet 6 day step down dose 05/22/14   Glendell Docker, NP  terconazole  (TERAZOL 7) 0.4 % vaginal cream Place 1 applicator vaginally at bedtime. 12/15/12   Gwen Pounds, CNM   BP 117/78 mmHg  Pulse 101  Temp(Src) 99.1 F (37.3 C) (Oral)  Resp 16  Wt 169 lb (76.658 kg)  SpO2 95%  LMP 05/04/2014   Physical Exam  Constitutional: She is oriented to person, place, and time. She appears well-developed and well-nourished. No distress.  HENT:  Head: Normocephalic and atraumatic.  Mouth/Throat: Oropharynx is clear and moist.  Eyes: Conjunctivae and EOM are normal. Pupils are equal, round, and reactive to light.  Neck: Normal range of motion. Neck supple.  Cardiovascular: Normal rate, regular rhythm and normal heart sounds.   Pulmonary/Chest: Effort normal and breath sounds normal. No respiratory distress. She has no wheezes.  Musculoskeletal: Normal range of motion.  Neurological: She is alert and oriented to person, place, and time.  Skin: Skin is warm and dry. Rash noted. Rash is papular. She is not diaphoretic.  Papular rash noted to dorsal aspect of bilateral hands; multiple areas of excoriation noted; skin appears very dry and cracked; no erythema, swelling, weeping lesions, or drainage; no lesions on palms; no hypopigmentation  Psychiatric: She has a normal mood and affect.  Nursing note and vitals reviewed.   ED Course  Procedures (including critical care time) Labs Review Labs Reviewed - No data to display  Imaging Review No results found.   EKG Interpretation None      MDM   Final diagnoses:  Rash and nonspecific skin eruption   29 y.o F with rash of bilateral dorsal hands.  This is her 3rd visit for the same.  She states she was treated for scabies and infection without improvement, however patient has not finished her keflex or prednisone prescriptions from visit 13 days ago so not feel she has been taking as directed.  Patient has papular rash noted dorsal aspect of bilateral hands with multiple areas of excoriation. Her skin is very  dry and cracked, however there is no discoloration, weeping lesions, or drainage. Patient has concern that this may be fungal, does not necessarily classic appearance for fungal infection however she has failed other treatment so will try Lamisil.  Patient will be referred to dermatology for further evaluation.  Discussed plan with patient, he/she acknowledged understanding and agreed with plan of care.  Return precautions given for new or worsening symptoms.  Larene Pickett, PA-C 06/04/14 0030  Carmin Muskrat, MD 06/04/14 2131

## 2014-06-03 NOTE — ED Notes (Signed)
Pt was given Cephalexin and prednisone during her visit on 4/29 for her skin condition.  Pt sts she has missed some doses, and currently has 4 pills left.  Pt sts she picked up the prescription a few days after receiving the prescription.

## 2014-06-03 NOTE — ED Notes (Addendum)
Pt sts she is unable to fully move her fingers because she feels the skin is taught from the swelling, itching, and rash.  Pt sts she has been using cocoa butter on her hands in order to loosen up the skin and allow her to have full motion of her fingers.

## 2015-07-04 ENCOUNTER — Encounter (HOSPITAL_COMMUNITY): Payer: Self-pay | Admitting: Emergency Medicine

## 2015-07-04 ENCOUNTER — Emergency Department (HOSPITAL_COMMUNITY)
Admission: EM | Admit: 2015-07-04 | Discharge: 2015-07-04 | Disposition: A | Discharge status: Home / Self Care | Payer: No Typology Code available for payment source | Attending: Emergency Medicine | Admitting: Emergency Medicine

## 2015-07-04 DIAGNOSIS — Z87891 Personal history of nicotine dependence: Secondary | ICD-10-CM | POA: Insufficient documentation

## 2015-07-04 DIAGNOSIS — Y92410 Unspecified street and highway as the place of occurrence of the external cause: Secondary | ICD-10-CM | POA: Insufficient documentation

## 2015-07-04 DIAGNOSIS — M791 Myalgia: Secondary | ICD-10-CM | POA: Diagnosis present

## 2015-07-04 DIAGNOSIS — Y999 Unspecified external cause status: Secondary | ICD-10-CM | POA: Insufficient documentation

## 2015-07-04 DIAGNOSIS — Y939 Activity, unspecified: Secondary | ICD-10-CM | POA: Insufficient documentation

## 2015-07-04 DIAGNOSIS — M7918 Myalgia, other site: Secondary | ICD-10-CM

## 2015-07-04 DIAGNOSIS — J45909 Unspecified asthma, uncomplicated: Secondary | ICD-10-CM | POA: Diagnosis not present

## 2015-07-04 DIAGNOSIS — Z79899 Other long term (current) drug therapy: Secondary | ICD-10-CM | POA: Diagnosis not present

## 2015-07-04 MED ORDER — NAPROXEN 500 MG PO TABS: 500.0000 mg | ORAL_TABLET | Freq: Two times a day (BID) | ORAL | Status: DC

## 2015-07-04 MED ORDER — IBUPROFEN 200 MG PO TABS
600.0000 mg | ORAL_TABLET | Freq: Once | ORAL | Status: AC
  Administered 2015-07-04: 600 mg via ORAL
  Filled 2015-07-04: qty 3

## 2015-07-04 MED ORDER — CYCLOBENZAPRINE HCL 10 MG PO TABS: 10.0000 mg | ORAL_TABLET | Freq: Two times a day (BID) | ORAL | Status: DC | PRN
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
## 2015-07-04 NOTE — ED Notes (Signed)
Dr. Stark Jock in to assess patient.
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
## 2016-12-01 ENCOUNTER — Emergency Department (HOSPITAL_COMMUNITY)
Admission: EM | Admit: 2016-12-01 | Discharge: 2016-12-02 | Disposition: A | Discharge status: Home / Self Care | Payer: Self-pay | Attending: Emergency Medicine | Admitting: Emergency Medicine

## 2016-12-01 ENCOUNTER — Encounter (HOSPITAL_COMMUNITY): Payer: Self-pay | Admitting: *Deleted

## 2016-12-01 ENCOUNTER — Emergency Department (HOSPITAL_COMMUNITY): Payer: Self-pay

## 2016-12-01 ENCOUNTER — Other Ambulatory Visit: Payer: Self-pay

## 2016-12-01 DIAGNOSIS — E860 Dehydration: Secondary | ICD-10-CM | POA: Insufficient documentation

## 2016-12-01 DIAGNOSIS — D573 Sickle-cell trait: Secondary | ICD-10-CM | POA: Insufficient documentation

## 2016-12-01 DIAGNOSIS — J45909 Unspecified asthma, uncomplicated: Secondary | ICD-10-CM | POA: Insufficient documentation

## 2016-12-01 DIAGNOSIS — E119 Type 2 diabetes mellitus without complications: Secondary | ICD-10-CM | POA: Insufficient documentation

## 2016-12-01 DIAGNOSIS — R112 Nausea with vomiting, unspecified: Secondary | ICD-10-CM | POA: Insufficient documentation

## 2016-12-01 DIAGNOSIS — K5900 Constipation, unspecified: Secondary | ICD-10-CM | POA: Insufficient documentation

## 2016-12-01 DIAGNOSIS — N179 Acute kidney failure, unspecified: Secondary | ICD-10-CM | POA: Insufficient documentation

## 2016-12-01 DIAGNOSIS — Z87891 Personal history of nicotine dependence: Secondary | ICD-10-CM | POA: Insufficient documentation

## 2016-12-01 HISTORY — DX: Type 2 diabetes mellitus without complications: E11.9

## 2016-12-01 LAB — URINALYSIS, ROUTINE W REFLEX MICROSCOPIC
Glucose, UA: NEGATIVE mg/dL
Ketones, ur: 80 mg/dL — AB
LEUKOCYTES UA: NEGATIVE
NITRITE: NEGATIVE
PROTEIN: 100 mg/dL — AB
Specific Gravity, Urine: 1.026 (ref 1.005–1.030)
pH: 6 (ref 5.0–8.0)

## 2016-12-01 LAB — COMPREHENSIVE METABOLIC PANEL
ALT: 24 U/L (ref 14–54)
ANION GAP: 13 (ref 5–15)
AST: 17 U/L (ref 15–41)
Albumin: 4.8 g/dL (ref 3.5–5.0)
Alkaline Phosphatase: 80 U/L (ref 38–126)
BILIRUBIN TOTAL: 1.6 mg/dL — AB (ref 0.3–1.2)
BUN: 25 mg/dL — AB (ref 6–20)
CHLORIDE: 105 mmol/L (ref 101–111)
CO2: 19 mmol/L — ABNORMAL LOW (ref 22–32)
Calcium: 10.8 mg/dL — ABNORMAL HIGH (ref 8.9–10.3)
Creatinine, Ser: 1.34 mg/dL — ABNORMAL HIGH (ref 0.44–1.00)
GFR calc Af Amer: 60 mL/min — ABNORMAL LOW (ref >60)
GFR calc non Af Amer: 52 mL/min — ABNORMAL LOW (ref >60)
Glucose, Bld: 162 mg/dL — ABNORMAL HIGH (ref 65–99)
POTASSIUM: 3.9 mmol/L (ref 3.5–5.1)
Sodium: 137 mmol/L (ref 135–145)
TOTAL PROTEIN: 10 g/dL — AB (ref 6.5–8.1)

## 2016-12-01 LAB — CBC
HCT: 45.4 % (ref 36.0–46.0)
HEMOGLOBIN: 15.7 g/dL — AB (ref 12.0–15.0)
MCH: 30.2 pg (ref 26.0–34.0)
MCHC: 34.6 g/dL (ref 30.0–36.0)
MCV: 87.3 fL (ref 78.0–100.0)
Platelets: 790 10*3/uL — ABNORMAL HIGH (ref 150–400)
RBC: 5.2 MIL/uL — ABNORMAL HIGH (ref 3.87–5.11)
RDW: 13.3 % (ref 11.5–15.5)
WBC: 14.7 10*3/uL — AB (ref 4.0–10.5)

## 2016-12-01 LAB — POC URINE PREG, ED: Preg Test, Ur: NEGATIVE

## 2016-12-01 LAB — LIPASE, BLOOD: LIPASE: 21 U/L (ref 11–51)

## 2016-12-01 IMAGING — CT CT ABD-PELV W/ CM
2 of 4 series · 16 of 46 positions shown, 18 images · IV contrast (APPLIED)
Comparison: None.

CLINICAL DATA: Vomiting for past 6 days.  dehydration

EXAM:
CT ABDOMEN AND PELVIS WITH CONTRAST
TECHNIQUE: Multidetector CT imaging of the abdomen and pelvis was performed
using the standard protocol following bolus administration of
intravenous contrast.
CONTRAST:  100mL [6Z] IOPAMIDOL ([6Z]) INJECTION 61%

[Series 3: abd/ pelvis 5.0 i30f 2 · axial · 0.82mm/px · z∈[+859,+1269]mm · 13 of 90 slices shown, 15 images]
[im 4/90  soft-tissue]
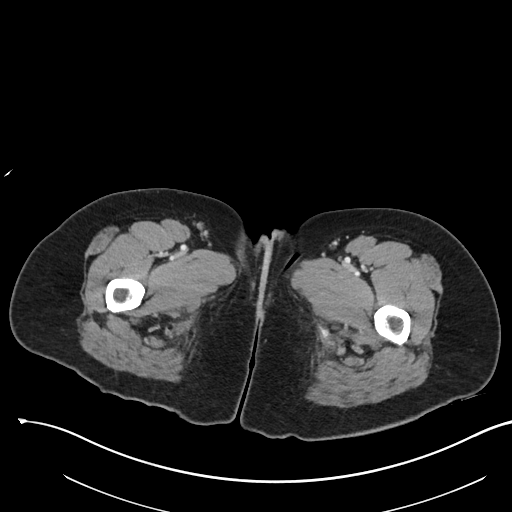
[im 4/90  bone]
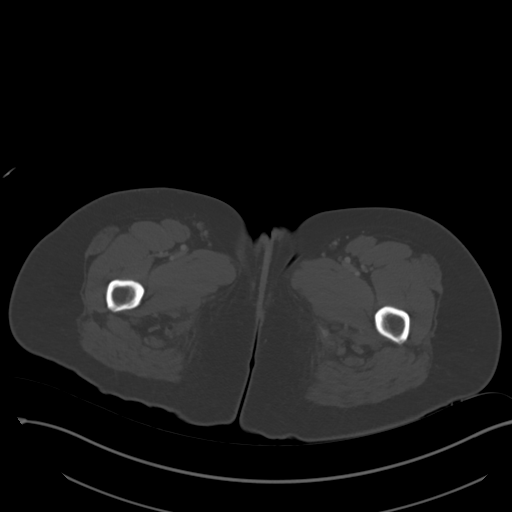
[im 11/90  soft-tissue]
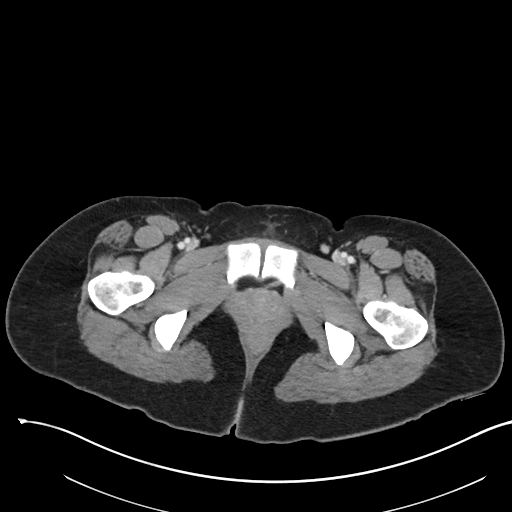
[im 18/90  soft-tissue]
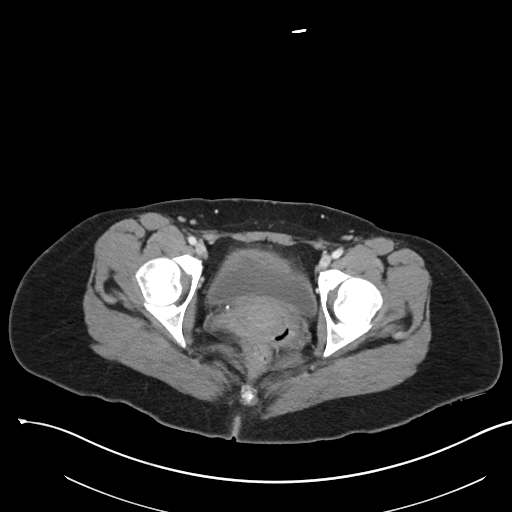
[im 25/90  soft-tissue]
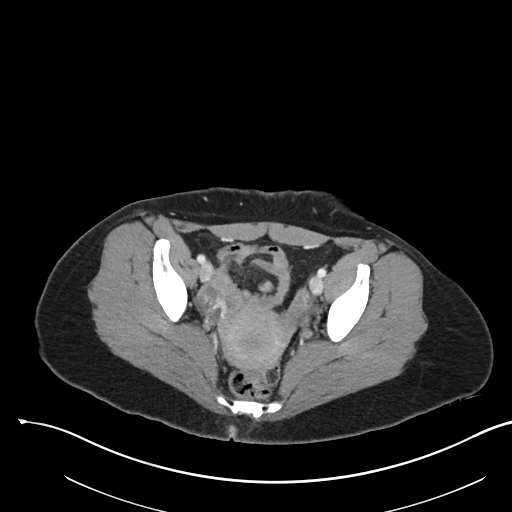
[im 33/90  soft-tissue]
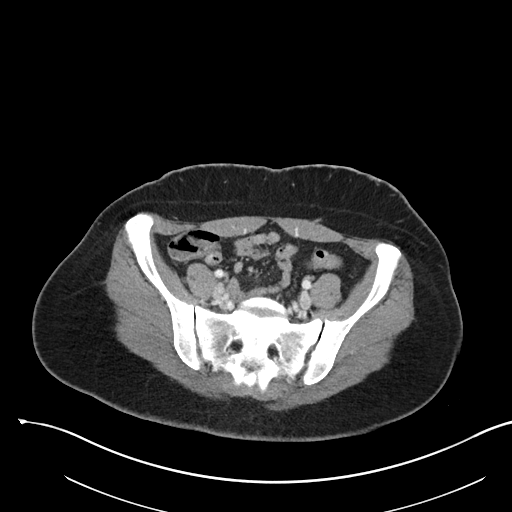
[im 40/90  soft-tissue]
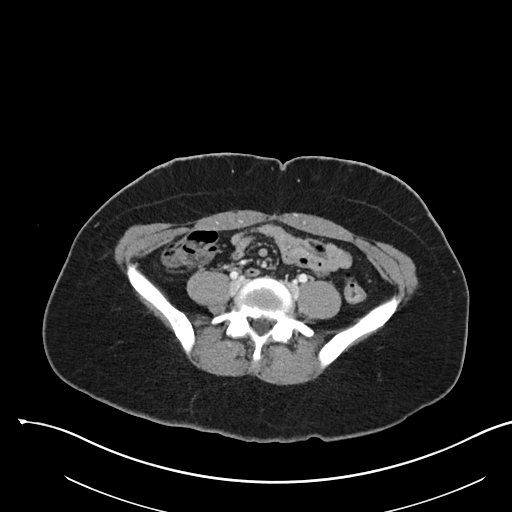
[im 47/90  soft-tissue]
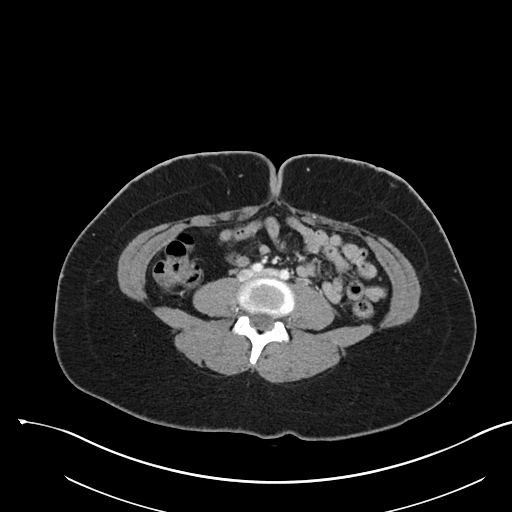
[im 50/90  soft-tissue]
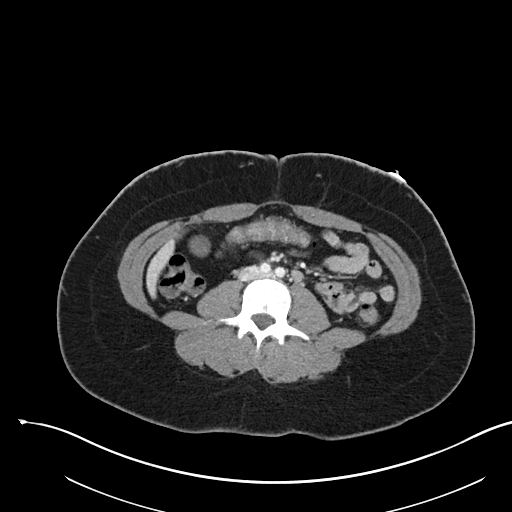
[im 57/90  soft-tissue]
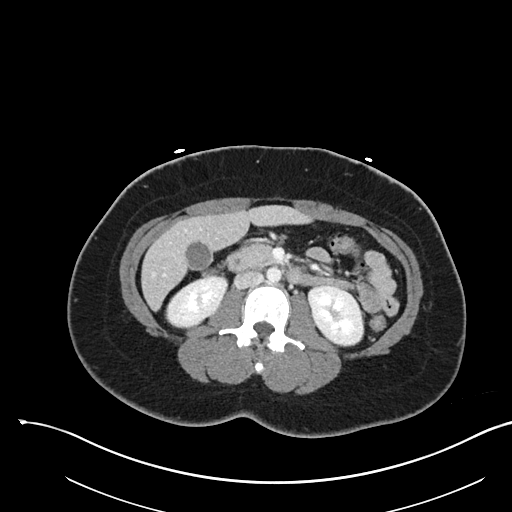
[im 57/90  bone]
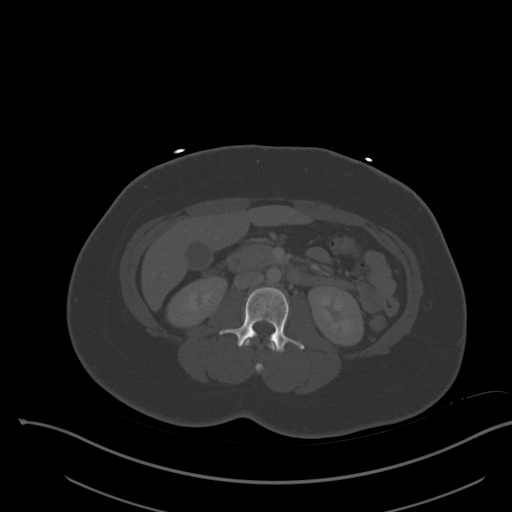
[im 65/90  soft-tissue]
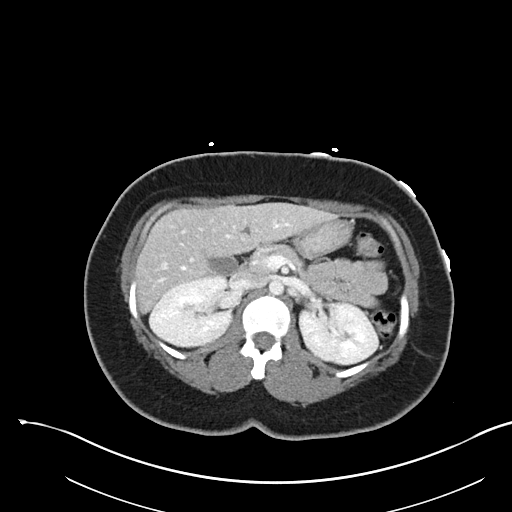
[im 72/90  soft-tissue]
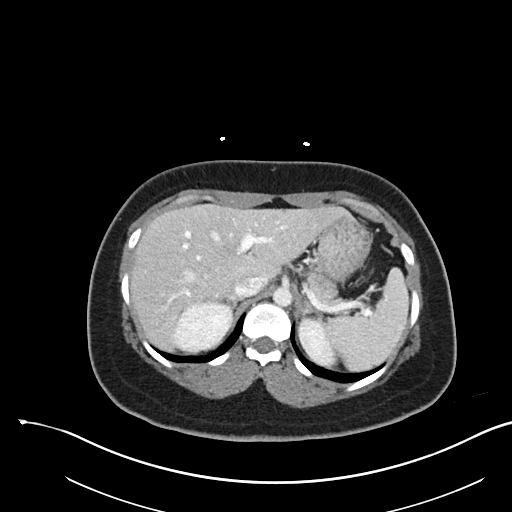
[im 79/90  soft-tissue]
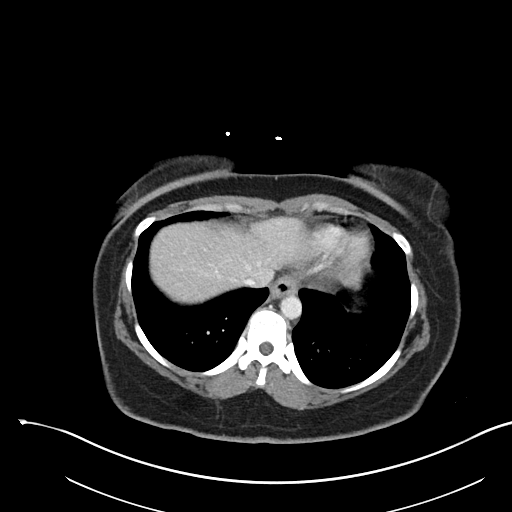
[im 86/90  soft-tissue]
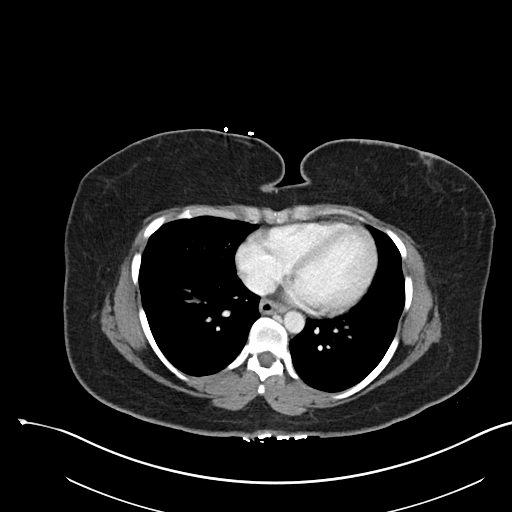

[Series 5: coronal soft tissue · coronal · 0.74mm/px · 3 of 90 slices shown]
[im 30/90  soft-tissue]
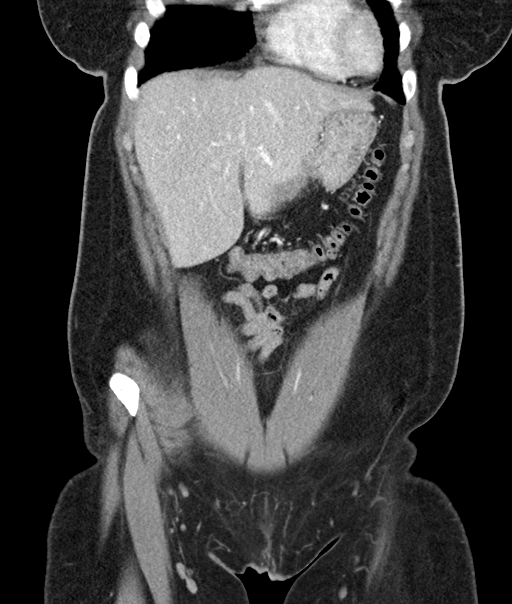
[im 40/90  soft-tissue]
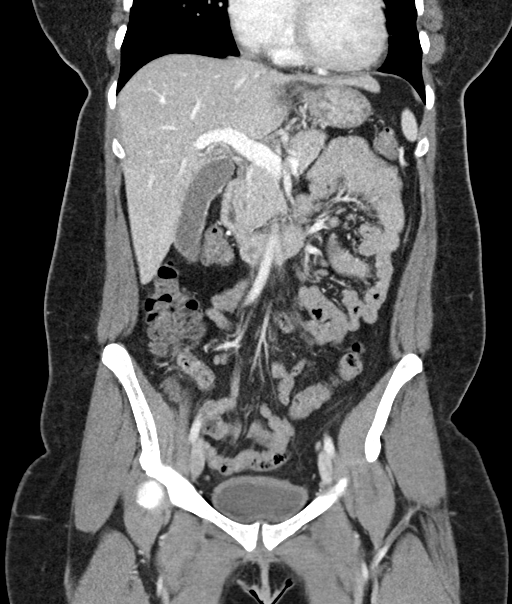
[im 50/90  soft-tissue]
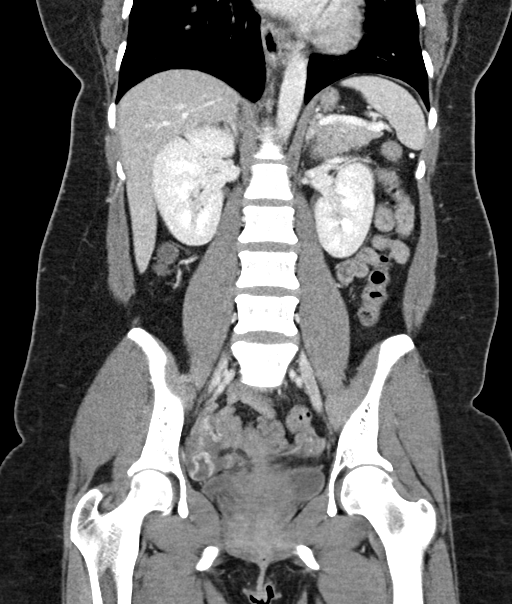

[16 of 46 positions shown; findings below may reference images not displayed]

FINDINGS: Lower chest: Lung bases are clear.

Hepatobiliary: No focal hepatic lesion. No biliary duct dilatation.
Gallbladder is normal. Common bile duct is normal.

Pancreas: Pancreas is normal. No ductal dilatation. No pancreatic
inflammation.

Spleen: Normal spleen

Adrenals/urinary tract: Adrenal glands and kidneys are normal. The
ureters and bladder normal.

Stomach/Bowel: Stomach, small bowel, appendix, and cecum are normal.
The colon and rectosigmoid colon are normal.

Vascular/Lymphatic: Abdominal aorta is normal caliber. There is no
retroperitoneal or periportal lymphadenopathy. No pelvic
lymphadenopathy.

Reproductive: Uterus is normal. Enhancing follicle within the RIGHT
ovary. Smaller free fluid the pelvis. LEFT ovary normal.

Other: No inguinal hernia or ventral hernia.

Musculoskeletal: No aggressive osseous lesion.
IMPRESSION: 1. Enhancing follicle/corpus luteal cyst of the RIGHT ovary suggests
recent ovulation.
2. Small amount free fluid the pelvis is favored physiologic fluid.
3. Normal appendix.
4. Normal gallbladder.

## 2016-12-01 MED ORDER — SODIUM CHLORIDE 0.9 % IV BOLUS (SEPSIS)
1000.0000 mL | Freq: Once | INTRAVENOUS | Status: AC
  Administered 2016-12-01: 1000 mL via INTRAVENOUS

## 2016-12-01 MED ORDER — IOPAMIDOL (ISOVUE-300) INJECTION 61%
INTRAVENOUS | Status: AC
  Administered 2016-12-01: 100 mL
  Filled 2016-12-01: qty 100

## 2016-12-01 MED ORDER — ONDANSETRON HCL 4 MG PO TABS: 4.0000 mg | ORAL_TABLET | Freq: Four times a day (QID) | ORAL | 0 refills | Status: DC

## 2016-12-01 MED ORDER — METOCLOPRAMIDE HCL 5 MG/ML IJ SOLN
10.0000 mg | Freq: Once | INTRAMUSCULAR | Status: AC
  Administered 2016-12-01: 10 mg via INTRAVENOUS
  Filled 2016-12-01: qty 2

## 2016-12-01 MED ORDER — ONDANSETRON 4 MG PO TBDP
8.0000 mg | ORAL_TABLET | Freq: Once | ORAL | Status: AC
  Administered 2016-12-01: 8 mg via ORAL
  Filled 2016-12-01: qty 2

## 2016-12-01 MED ORDER — POLYETHYLENE GLYCOL 3350 17 GM/SCOOP PO POWD: ORAL | 0 refills | Status: DC

## 2016-12-01 MED ORDER — FLEET ENEMA 7-19 GM/118ML RE ENEM
1.0000 | ENEMA | Freq: Once | RECTAL | Status: AC
  Administered 2016-12-01: 1 via RECTAL
  Filled 2016-12-01: qty 1

## 2016-12-01 NOTE — ED Notes (Signed)
Pt given sprite and water to drink

## 2016-12-01 NOTE — ED Provider Notes (Signed)
[facility] EMERGENCY DEPARTMENT Provider Note   CSN: [id number] Arrival date & time: 12/01/16  1734     History   Chief Complaint Chief Complaint  Patient presents with  . Emesis    HPI Jill Clarke is a 31 y.o. female presenting with nausea vomiting and constipation.  Patient states that 6 days ago, she started to feel sick.  She reports feeling nauseous and having difficulty having a bowel movement.  2 days later, she started to have persistent vomiting.  She reports she has been unable to tolerate any p.o. intake for the past 4 days.  She reports feeling weak and tired.  She reports abdominal pain only when vomiting, and this is located in her back.  She reports right-sided chest pain while vomiting and trying to take a deep breath, which is been present for the past 1 day.  She denies history of similar, but states that she had persistent vomiting while she was pregnant.  She states there is no chance that she is pregnant, does not use birth control.  Last period was 2 weeks ago. She denies fevers, chills, sore throat, cough, shortness of breath, vaginal discharge, or urinary symptoms.  She denies history of abdominal problems including UC or Crohn's.  She does not take any medications daily.  HPI  Past Medical History:  Diagnosis Date  . Asthma    uses inhaler prn  . Diabetes mellitus without complication ([place])   . Sickle cell trait ([place])   . Unspecified high-risk pregnancy 01/27/2011    Patient Active Problem List   Diagnosis Date Noted  . Unspecified contraceptive management 10/02/2011  . Unspecified high-risk pregnancy 01/27/2011  . Low birth weight 01/23/2011  . Asthma 01/23/2011  . Short interval between pregnancies complicating pregnancy, antepartum 01/23/2011  . Nausea and vomiting in pregnancy 12/28/2010    Past Surgical History:  Procedure Laterality Date  . TONSILLECTOMY    . WISDOM TOOTH EXTRACTION      OB History    Gravida Para  Term Preterm AB Living   2 2 2  0 0 2   SAB TAB Ectopic Multiple Live Births   0 0 0 0 2       Home Medications    Prior to Admission medications   Medication Sig Start Date End Date Taking? Authorizing Provider  albuterol (PROVENTIL HFA;VENTOLIN HFA) 108 (90 BASE) MCG/ACT inhaler Inhale 1-2 puffs into the lungs every 6 (six) hours as needed for wheezing or shortness of breath. Patient not taking: Reported on 12/01/2016 06/03/12   Rosana Hoes, MD  albuterol (PROVENTIL HFA;VENTOLIN HFA) 108 (90 BASE) MCG/ACT inhaler Inhale 2 puffs into the lungs every 2 (two) hours as needed for wheezing or shortness of breath (cough). Patient not taking: Reported on 12/01/2016 01/19/14   Street, Home, PA-C  amoxicillin-clavulanate (AUGMENTIN) 875-125 MG per tablet Take 1 tablet by mouth every 12 (twelve) hours. Patient not taking: Reported on 12/01/2016 02/11/13   Janne Napoleon, NP  cyclobenzaprine (FLEXERIL) 10 MG tablet Take 1 tablet (10 mg total) by mouth 2 (two) times daily as needed for muscle spasms. Patient not taking: Reported on 12/01/2016 07/04/15   Dowless, Aldona Bar Tripp, PA-C  guaiFENesin (MUCINEX) 600 MG 12 hr tablet Take 1 tablet (600 mg total) by mouth 2 (two) times daily as needed for cough or to loosen phlegm. Patient not taking: Reported on 12/01/2016 01/19/14   Street, Earl, PA-C  ketoconazole (NIZORAL) 2 % cream Apply 1 application topically daily. Patient  not taking: Reported on 12/01/2016 12/15/12   Gwen Pounds, CNM  loratadine (CLARITIN) 10 MG tablet Take 1 tablet (10 mg total) by mouth daily. One po daily x 5 days Patient not taking: Reported on 12/01/2016 01/19/14   Street, Forbes, PA-C  naproxen (NAPROSYN) 500 MG tablet Take 1 tablet (500 mg total) by mouth 2 (two) times daily. Patient not taking: Reported on 12/01/2016 07/04/15   [place], Aldona Bar Tripp, PA-C  ondansetron (ZOFRAN) 4 MG tablet Take 1 tablet (4 mg total) every 6 (six) hours by mouth. 12/01/16   Shalva Rozycki,  PA-C  permethrin (ELIMITE) 5 % cream Apply to affected area once, leave for 8 hours then shower Patient not taking: Reported on 12/01/2016 05/19/14   Presson, Audelia Hives, PA  polyethylene glycol powder (GLYCOLAX/MIRALAX) powder Take 1-2 capfuls daily until you have normal bowel movements 12/01/16   Corbin Falck, PA-C  predniSONE (DELTASONE) 10 MG tablet 6 day step down dose Patient not taking: Reported on 12/01/2016 05/22/14   Glendell Docker, NP  terbinafine (LAMISIL AT) 1 % cream Apply 1 application topically 2 (two) times daily. Patient not taking: Reported on 12/01/2016 06/03/14   Larene Pickett, PA-C  terconazole (TERAZOL 7) 0.4 % vaginal cream Place 1 applicator vaginally at bedtime. Patient not taking: Reported on 12/01/2016 12/15/12   Gwen Pounds, CNM    Family History Family History  Problem Relation Age of Onset  . Sickle cell trait Mother   . Depression Mother   . Asthma Father   . Drug abuse Father        heroin abuse  . Kidney disease Father        due to heroin abuse  . Diabetes Maternal Grandmother     Social History Social History   Tobacco Use  . Smoking status: Former Research scientist (life sciences)  . Smokeless tobacco: Never Used  Substance Use Topics  . Alcohol use: No  . Drug use: No     Allergies   Patient has no known allergies.   Review of Systems Review of Systems  Constitutional: Positive for appetite change.  Cardiovascular: Positive for chest pain (when vomiting).  Gastrointestinal: Positive for constipation, nausea and vomiting.  Musculoskeletal: Positive for back pain (when vomiting).  Neurological: Positive for weakness.  All other systems reviewed and are negative.    Physical Exam Updated Vital Signs BP 105/78 (BP Location: Left Arm)   Pulse (!) 119   Temp 99.6 F (37.6 C)   Resp 14   Ht 5' (1.524 m)   Wt 68 kg (150 lb)   LMP 10/31/2016   SpO2 100%   BMI 29.29 kg/m   Physical Exam  Constitutional: She is oriented to person, place,  and time. She appears well-developed and well-nourished. No distress.  HENT:  Head: Normocephalic and atraumatic.  Mouth/Throat: Uvula is midline and oropharynx is clear and moist. Mucous membranes are dry.  Eyes: EOM are normal.  Neck: Normal range of motion.  Cardiovascular: Regular rhythm and intact distal pulses.  Pt tachycardic  Pulmonary/Chest: Effort normal and breath sounds normal. No respiratory distress. She has no wheezes. She has no rales. She exhibits tenderness.  TTP of R sided chest wall  Abdominal: Soft. She exhibits no distension and no mass. There is tenderness. There is no rebound and no guarding.  TTP of epigastric and RLQ abd. Discomfort and increased nausea with TTP of rest of abd. No rigidity or guarding.   Musculoskeletal: Normal range of motion.  Lymphadenopathy:  She has no cervical adenopathy.  Neurological: She is alert and oriented to person, place, and time.  Skin: Skin is warm and dry.  Psychiatric: She has a normal mood and affect.  Nursing note and vitals reviewed.    ED Treatments / Results  Labs (all labs ordered are listed, but only abnormal results are displayed) Labs Reviewed  COMPREHENSIVE METABOLIC PANEL - Abnormal; Notable for the following components:      Result Value   CO2 19 (*)    Glucose, Bld 162 (*)    BUN 25 (*)    Creatinine, Ser 1.34 (*)    Calcium 10.8 (*)    Total Protein 10.0 (*)    Total Bilirubin 1.6 (*)    GFR calc non Af Amer 52 (*)    GFR calc Af Amer 60 (*)    All other components within normal limits  CBC - Abnormal; Notable for the following components:   WBC 14.7 (*)    RBC 5.20 (*)    Hemoglobin 15.7 (*)    Platelets 790 (*)    All other components within normal limits  URINALYSIS, ROUTINE W REFLEX MICROSCOPIC - Abnormal; Notable for the following components:   Color, Urine AMBER (*)    APPearance HAZY (*)    Hgb urine dipstick SMALL (*)    Bilirubin Urine SMALL (*)    Ketones, ur 80 (*)    Protein, ur  100 (*)    Bacteria, UA RARE (*)    Squamous Epithelial / LPF 0-5 (*)    All other components within normal limits  LIPASE, BLOOD  POC URINE PREG, ED    EKG  EKG Interpretation None       Radiology Ct Abdomen Pelvis W Contrast  Result Date: 12/01/2016 CLINICAL DATA:  Vomiting for past 6 days.  dehydration EXAM: CT ABDOMEN AND PELVIS WITH CONTRAST TECHNIQUE: Multidetector CT imaging of the abdomen and pelvis was performed using the standard protocol following bolus administration of intravenous contrast. CONTRAST:  167mL ISOVUE-300 IOPAMIDOL (ISOVUE-300) INJECTION 61% COMPARISON:  None. FINDINGS: Lower chest: Lung bases are clear. Hepatobiliary: No focal hepatic lesion. No biliary duct dilatation. Gallbladder is normal. Common bile duct is normal. Pancreas: Pancreas is normal. No ductal dilatation. No pancreatic inflammation. Spleen: Normal spleen Adrenals/urinary tract: Adrenal glands and kidneys are normal. The ureters and bladder normal. Stomach/Bowel: Stomach, small bowel, appendix, and cecum are normal. The colon and rectosigmoid colon are normal. Vascular/Lymphatic: Abdominal aorta is normal caliber. There is no retroperitoneal or periportal lymphadenopathy. No pelvic lymphadenopathy. Reproductive: Uterus is normal. Enhancing follicle within the RIGHT ovary. Smaller free fluid the pelvis. LEFT ovary normal. Other: No inguinal hernia or ventral hernia. Musculoskeletal: No aggressive osseous lesion. IMPRESSION: 1. Enhancing follicle/corpus luteal cyst of the RIGHT ovary suggests recent ovulation. 2. Small amount free fluid the pelvis is favored physiologic fluid. 3. Normal appendix. 4. Normal gallbladder. Electronically Signed   By: Suzy Bouchard M.D.   On: 12/01/2016 20:29    Procedures Procedures (including critical care time)  Medications Ordered in ED Medications  ondansetron (ZOFRAN-ODT) disintegrating tablet 8 mg (8 mg Oral Given 12/01/16 1808)  sodium chloride 0.9 % bolus 1,000  mL (0 mLs Intravenous Stopped 12/01/16 1940)  metoCLOPramide (REGLAN) injection 10 mg (10 mg Intravenous Given 12/01/16 1943)  sodium chloride 0.9 % bolus 1,000 mL (0 mLs Intravenous Stopped 12/01/16 2030)  iopamidol (ISOVUE-300) 61 % injection (100 mLs  Contrast Given 12/01/16 2009)  sodium chloride 0.9 % bolus 1,000 mL (  0 mLs Intravenous Stopped 12/02/16 0000)  sodium phosphate (FLEET) 7-19 GM/118ML enema 1 enema (1 enema Rectal Given 12/01/16 2239)     Initial Impression / Assessment and Plan / ED Course  I have reviewed the triage vital signs and the nursing notes.  Pertinent labs & imaging results that were available during my care of the patient were reviewed by me and considered in my medical decision making (see chart for details).     Patient presenting with several day history of nausea, vomiting, and constipation.  No fevers or chills.  Physical exam shows patient is very tachycardic at 160.  Fluids started.  Nausea improved with Zofran, but not completely alleviated.  Abdominal exam shows tenderness of the epigastrium and right lower quadrant.  Will obtain basic abdominal labs, continue IV fluids, and give Reglan for further symptom control.  Pregnancy test ordered.  Labs show slightly elevated glucose at 162, creatinine elevated at 1.34, leukocytosis at 14.7.  UA without infection.  Pregnancy negative.  Lipase normal.  Will obtain CT scan to rule out appendicitis or other intra-abdominal infection.  IV fluids continued.  Patient states nausea improved with Reglan.  CT scan negative for infection, perforation, or obstruction.  Shows right ovarian cyst.  No other abnormality.  On review of CT, patient with significant stool burden.  As she has not had a bowel movement 6 days, will do enema, p.o. challenge, and continue IV fluids.  Case discussed with attending, Dr. Vanita Panda evaluated the patient.  Patient had successful bowel movement with enema.  Is tolerating p.o. intake without  difficulty.  Heart rate has improved to 110.  Patient states she is feeling much better, and is walking around the room.  She no longer feels as weak.  Will discharge patient with Zofran for nausea and vomiting and MiraLAX as needed for constipation.  Stressed importance of hydration.  Patient given information for [facility] and wellness to follow-up for evaluation of her blood sugar and ensure her abdominal symptoms have resolved.  At this time, patient appears safe for discharge.  Return precautions given.  Patient states she understands and agrees to plan.   Final Clinical Impressions(s) / ED Diagnoses   Final diagnoses:  Non-intractable vomiting with nausea, unspecified vomiting type  Constipation, unspecified constipation type  Dehydration  AKI (acute kidney injury) [facility])    ED Discharge Orders        Ordered    ondansetron (ZOFRAN) 4 MG tablet  Every 6 hours     12/01/16 2329    polyethylene glycol powder (GLYCOLAX/MIRALAX) powder     12/01/16 2329       Roman Dubuc, PA-C 12/02/16 0130    Carmin Muskrat, MD 12/02/16 2339

## 2016-12-01 NOTE — ED Triage Notes (Signed)
The pt is c/o vomiting for the past 6 days no bm  unalve to hold food down  lmp  lmp oct 19th

## 2016-12-02 NOTE — Discharge Instructions (Signed)
It is very important that you stay well-hydrated.  Try and drink as much water as you can. Use Zofran as needed for nausea or vomiting. Use 1 capful of MiraLAX once daily until you are having normal bowel movements. Follow-up with [place] and wellness for further evaluation of your blood sugar and stomach. Return to the emergency room if you develop fevers, persistent vomiting despite medication, any worsening symptoms, or any new or concerning symptoms.

## 2016-12-02 NOTE — ED Notes (Signed)
Pt departed in NAD.  

## 2020-02-10 ENCOUNTER — Encounter (HOSPITAL_COMMUNITY): Payer: Self-pay | Admitting: *Deleted

## 2020-02-10 ENCOUNTER — Emergency Department (HOSPITAL_COMMUNITY)
Admission: EM | Admit: 2020-02-10 | Discharge: 2020-02-11 | Disposition: A | Discharge status: Home / Self Care | Payer: Self-pay | Attending: Emergency Medicine | Admitting: Emergency Medicine

## 2020-02-10 DIAGNOSIS — R112 Nausea with vomiting, unspecified: Secondary | ICD-10-CM | POA: Insufficient documentation

## 2020-02-10 DIAGNOSIS — R197 Diarrhea, unspecified: Secondary | ICD-10-CM | POA: Insufficient documentation

## 2020-02-10 DIAGNOSIS — Z20822 Contact with and (suspected) exposure to covid-19: Secondary | ICD-10-CM | POA: Insufficient documentation

## 2020-02-10 DIAGNOSIS — Z87891 Personal history of nicotine dependence: Secondary | ICD-10-CM | POA: Insufficient documentation

## 2020-02-10 DIAGNOSIS — J45909 Unspecified asthma, uncomplicated: Secondary | ICD-10-CM | POA: Insufficient documentation

## 2020-02-10 DIAGNOSIS — E119 Type 2 diabetes mellitus without complications: Secondary | ICD-10-CM | POA: Insufficient documentation

## 2020-02-10 LAB — COMPREHENSIVE METABOLIC PANEL
ALT: 42 U/L (ref 0–44)
AST: 32 U/L (ref 15–41)
Albumin: 3.9 g/dL (ref 3.5–5.0)
Alkaline Phosphatase: 58 U/L (ref 38–126)
Anion gap: 22 — ABNORMAL HIGH (ref 5–15)
BUN: 12 mg/dL (ref 6–20)
CO2: 18 mmol/L — ABNORMAL LOW (ref 22–32)
Calcium: 9.4 mg/dL (ref 8.9–10.3)
Chloride: 95 mmol/L — ABNORMAL LOW (ref 98–111)
Creatinine, Ser: 0.82 mg/dL (ref 0.44–1.00)
GFR, Estimated: >60 mL/min (ref >60)
Glucose, Bld: 150 mg/dL — ABNORMAL HIGH (ref 70–99)
Potassium: 2.9 mmol/L — ABNORMAL LOW (ref 3.5–5.1)
Sodium: 135 mmol/L (ref 135–145)
Total Bilirubin: 1.6 mg/dL — ABNORMAL HIGH (ref 0.3–1.2)
Total Protein: 8.5 g/dL — ABNORMAL HIGH (ref 6.5–8.1)

## 2020-02-10 LAB — CBC
HCT: 41.2 % (ref 36.0–46.0)
Hemoglobin: 13.6 g/dL (ref 12.0–15.0)
MCH: 29.1 pg (ref 26.0–34.0)
MCHC: 33 g/dL (ref 30.0–36.0)
MCV: 88.2 fL (ref 80.0–100.0)
Platelets: 634 10*3/uL — ABNORMAL HIGH (ref 150–400)
RBC: 4.67 MIL/uL (ref 3.87–5.11)
RDW: 12.9 % (ref 11.5–15.5)
WBC: 11.7 10*3/uL — ABNORMAL HIGH (ref 4.0–10.5)
nRBC: 0 % (ref 0.0–0.2)

## 2020-02-10 LAB — I-STAT BETA HCG BLOOD, ED (MC, WL, AP ONLY): I-stat hCG, quantitative: <5 m[IU]/mL (ref <5)

## 2020-02-10 LAB — LIPASE, BLOOD: Lipase: 18 U/L (ref 11–51)

## 2020-02-10 MED ORDER — ONDANSETRON 4 MG PO TBDP
4.0000 mg | ORAL_TABLET | Freq: Once | ORAL | Status: AC | PRN
  Administered 2020-02-10: 4 mg via ORAL
  Filled 2020-02-10: qty 1

## 2020-02-10 NOTE — ED Triage Notes (Signed)
To ED for eval of N/V for past 7 days. Pt denies cp. Denies pain with urination. Pt is alert. Appears to not feel well.

## 2020-02-11 ENCOUNTER — Other Ambulatory Visit: Payer: Self-pay

## 2020-02-11 DIAGNOSIS — D62 Acute posthemorrhagic anemia: Secondary | ICD-10-CM | POA: Diagnosis not present

## 2020-02-11 DIAGNOSIS — Z825 Family history of asthma and other chronic lower respiratory diseases: Secondary | ICD-10-CM

## 2020-02-11 DIAGNOSIS — J45909 Unspecified asthma, uncomplicated: Secondary | ICD-10-CM | POA: Diagnosis present

## 2020-02-11 DIAGNOSIS — R29810 Facial weakness: Secondary | ICD-10-CM | POA: Diagnosis present

## 2020-02-11 DIAGNOSIS — Z833 Family history of diabetes mellitus: Secondary | ICD-10-CM

## 2020-02-11 DIAGNOSIS — H5340 Unspecified visual field defects: Secondary | ICD-10-CM | POA: Diagnosis present

## 2020-02-11 DIAGNOSIS — G8191 Hemiplegia, unspecified affecting right dominant side: Secondary | ICD-10-CM | POA: Diagnosis present

## 2020-02-11 DIAGNOSIS — R4189 Other symptoms and signs involving cognitive functions and awareness: Secondary | ICD-10-CM | POA: Diagnosis present

## 2020-02-11 DIAGNOSIS — K529 Noninfective gastroenteritis and colitis, unspecified: Secondary | ICD-10-CM | POA: Diagnosis present

## 2020-02-11 DIAGNOSIS — W19XXXA Unspecified fall, initial encounter: Secondary | ICD-10-CM | POA: Diagnosis not present

## 2020-02-11 DIAGNOSIS — G9349 Other encephalopathy: Secondary | ICD-10-CM | POA: Diagnosis present

## 2020-02-11 DIAGNOSIS — G08 Intracranial and intraspinal phlebitis and thrombophlebitis: Secondary | ICD-10-CM | POA: Diagnosis not present

## 2020-02-11 DIAGNOSIS — R29733 NIHSS score 33: Secondary | ICD-10-CM | POA: Diagnosis not present

## 2020-02-11 DIAGNOSIS — R29727 NIHSS score 27: Secondary | ICD-10-CM | POA: Diagnosis present

## 2020-02-11 DIAGNOSIS — E860 Dehydration: Secondary | ICD-10-CM | POA: Diagnosis present

## 2020-02-11 DIAGNOSIS — I495 Sick sinus syndrome: Secondary | ICD-10-CM | POA: Diagnosis present

## 2020-02-11 DIAGNOSIS — Z20822 Contact with and (suspected) exposure to covid-19: Secondary | ICD-10-CM | POA: Diagnosis present

## 2020-02-11 DIAGNOSIS — Z832 Family history of diseases of the blood and blood-forming organs and certain disorders involving the immune mechanism: Secondary | ICD-10-CM

## 2020-02-11 DIAGNOSIS — R443 Hallucinations, unspecified: Secondary | ICD-10-CM | POA: Diagnosis present

## 2020-02-11 DIAGNOSIS — D6859 Other primary thrombophilia: Secondary | ICD-10-CM | POA: Diagnosis present

## 2020-02-11 DIAGNOSIS — H7090 Unspecified mastoiditis, unspecified ear: Secondary | ICD-10-CM | POA: Diagnosis present

## 2020-02-11 DIAGNOSIS — D573 Sickle-cell trait: Secondary | ICD-10-CM | POA: Diagnosis present

## 2020-02-11 DIAGNOSIS — Z841 Family history of disorders of kidney and ureter: Secondary | ICD-10-CM

## 2020-02-11 DIAGNOSIS — I608 Other nontraumatic subarachnoid hemorrhage: Principal | ICD-10-CM | POA: Diagnosis present

## 2020-02-11 DIAGNOSIS — Z87891 Personal history of nicotine dependence: Secondary | ICD-10-CM

## 2020-02-11 DIAGNOSIS — I959 Hypotension, unspecified: Secondary | ICD-10-CM | POA: Diagnosis present

## 2020-02-11 DIAGNOSIS — I636 Cerebral infarction due to cerebral venous thrombosis, nonpyogenic: Principal | ICD-10-CM | POA: Diagnosis present

## 2020-02-11 DIAGNOSIS — Z7952 Long term (current) use of systemic steroids: Secondary | ICD-10-CM

## 2020-02-11 DIAGNOSIS — E876 Hypokalemia: Secondary | ICD-10-CM | POA: Diagnosis present

## 2020-02-11 DIAGNOSIS — Z79899 Other long term (current) drug therapy: Secondary | ICD-10-CM

## 2020-02-11 DIAGNOSIS — G936 Cerebral edema: Secondary | ICD-10-CM | POA: Diagnosis not present

## 2020-02-11 DIAGNOSIS — J95821 Acute postprocedural respiratory failure: Secondary | ICD-10-CM | POA: Diagnosis not present

## 2020-02-11 DIAGNOSIS — Y92239 Unspecified place in hospital as the place of occurrence of the external cause: Secondary | ICD-10-CM | POA: Diagnosis not present

## 2020-02-11 LAB — URINALYSIS, ROUTINE W REFLEX MICROSCOPIC
Bilirubin Urine: NEGATIVE
Glucose, UA: 50 mg/dL — AB
Ketones, ur: 80 mg/dL — AB
Leukocytes,Ua: NEGATIVE
Nitrite: NEGATIVE
Protein, ur: 100 mg/dL — AB
Specific Gravity, Urine: 1.02 (ref 1.005–1.030)
pH: 6 (ref 5.0–8.0)

## 2020-02-11 LAB — SARS CORONAVIRUS 2 (TAT 6-24 HRS): SARS Coronavirus 2: NEGATIVE

## 2020-02-11 MED ORDER — POTASSIUM CHLORIDE CRYS ER 20 MEQ PO TBCR
40.0000 meq | EXTENDED_RELEASE_TABLET | Freq: Once | ORAL | Status: AC
  Administered 2020-02-11: 40 meq via ORAL
  Filled 2020-02-11: qty 2

## 2020-02-11 MED ORDER — ONDANSETRON HCL 4 MG/2ML IJ SOLN
4.0000 mg | Freq: Once | INTRAMUSCULAR | Status: AC
  Administered 2020-02-11: 4 mg via INTRAVENOUS
  Filled 2020-02-11: qty 2

## 2020-02-11 MED ORDER — SODIUM CHLORIDE 0.9 % IV BOLUS
1000.0000 mL | Freq: Once | INTRAVENOUS | Status: AC
  Administered 2020-02-11: 1000 mL via INTRAVENOUS

## 2020-02-11 MED ORDER — ACETAMINOPHEN 325 MG PO TABS
650.0000 mg | ORAL_TABLET | Freq: Once | ORAL | Status: AC
  Administered 2020-02-11: 650 mg via ORAL
  Filled 2020-02-11: qty 2

## 2020-02-11 MED ORDER — ONDANSETRON 4 MG PO TBDP: 4.0000 mg | ORAL_TABLET | Freq: Three times a day (TID) | ORAL | 0 refills | Status: DC | PRN

## 2020-02-11 NOTE — ED Provider Notes (Signed)
Signout from [employer] at shift change.  Patient awaiting UA and p.o. challenge.  She was given oral potassium for hypokalemia.  Patient has tolerated p.o.'s.  Delay in obtaining UA, however this does not demonstrate any acute infection.  No abd tenderness on re-exam.   Patient agreeable to discharge home. COVID test pending.   The patient was urged to return to the Emergency Department immediately with worsening of current symptoms, worsening abdominal pain, persistent vomiting, blood noted in stools, fever, or any other concerns. The patient verbalized understanding.     Carlisle Cater, PA-C 02/11/20 [place], [place], DO 02/11/20 1127

## 2020-02-11 NOTE — ED Notes (Signed)
Patient called x2 with no response for vitals recheck and not visible in lobby

## 2020-02-11 NOTE — ED Notes (Signed)
Patient came back to lobby

## 2020-02-11 NOTE — ED Provider Notes (Signed)
[facility]ER[facility]ENC[facility] DEPARTMENT Provider Note   CSN: [id number] Arrival date & time: 02/10/20  1827     History Chief Complaint  Patient presents with  . Nausea  . Emesis    AARIEL Clarke is a 35 y.o. female.   Emesis Associated symptoms: diarrhea     35 y.o. F with hx of asthma, DM, sickle cell trait, presenting to the ED with N/V/D.  States symptoms started a week ago, diarrhea has since resolved.  States she tried to start eating/drinking again yesterday but not able to hold it down.  Denies fever, chills, sweats.  States she feels very weak overall and dehydrated.  Denies sick contacts or known covid exposures.  She is unvaccinated for covid-19.  Given zofran in triage but still very nauseated.  Past Medical History:  Diagnosis Date  . Asthma    uses inhaler prn  . Diabetes mellitus without complication ([place])   . Sickle cell trait ([place])   . Unspecified high-risk pregnancy 01/27/2011    Patient Active Problem List   Diagnosis Date Noted  . Unspecified contraceptive management 10/02/2011  . Unspecified high-risk pregnancy 01/27/2011  . Low birth weight 01/23/2011  . Asthma 01/23/2011  . Short interval between pregnancies complicating pregnancy, antepartum 01/23/2011  . Nausea and vomiting in pregnancy 12/28/2010    Past Surgical History:  Procedure Laterality Date  . TONSILLECTOMY    . WISDOM TOOTH EXTRACTION       OB History    Gravida  2   Para  2   Term  2   Preterm  0   AB  0   Living  2     SAB  0   IAB  0   Ectopic  0   Multiple  0   Live Births  2           Family History  Problem Relation Age of Onset  . Sickle cell trait Mother   . Depression Mother   . Asthma Father   . Drug abuse Father        heroin abuse  . Kidney disease Father        due to heroin abuse  . Diabetes Maternal Grandmother     Social History   Tobacco Use  . Smoking status: Former Research scientist (life sciences)  . Smokeless tobacco: Never Used   Substance Use Topics  . Alcohol use: No  . Drug use: No    Home Medications Prior to Admission medications   Medication Sig Start Date End Date Taking? Authorizing Provider  albuterol (PROVENTIL HFA;VENTOLIN HFA) 108 (90 BASE) MCG/ACT inhaler Inhale 1-2 puffs into the lungs every 6 (six) hours as needed for wheezing or shortness of breath. Patient not taking: Reported on 12/01/2016 06/03/12   Jill Hoes, MD  albuterol (PROVENTIL HFA;VENTOLIN HFA) 108 (90 BASE) MCG/ACT inhaler Inhale 2 puffs into the lungs every 2 (two) hours as needed for wheezing or shortness of breath (cough). Patient not taking: Reported on 12/01/2016 01/19/14   Street, Isola, PA-C  amoxicillin-clavulanate (AUGMENTIN) 875-125 MG per tablet Take 1 tablet by mouth every 12 (twelve) hours. Patient not taking: Reported on 12/01/2016 02/11/13   Janne Napoleon, NP  cyclobenzaprine (FLEXERIL) 10 MG tablet Take 1 tablet (10 mg total) by mouth 2 (two) times daily as needed for muscle spasms. Patient not taking: Reported on 12/01/2016 07/04/15   Dowless, Aldona Bar Tripp, PA-C  guaiFENesin (MUCINEX) 600 MG 12 hr tablet Take 1 tablet (600 mg total)  by mouth 2 (two) times daily as needed for cough or to loosen phlegm. Patient not taking: Reported on 12/01/2016 01/19/14   Street, [place], PA-C  ketoconazole (NIZORAL) 2 % cream Apply 1 application topically daily. Patient not taking: Reported on 12/01/2016 12/15/12   Cyndee Brightly, CNM  loratadine (CLARITIN) 10 MG tablet Take 1 tablet (10 mg total) by mouth daily. One po daily x 5 days Patient not taking: Reported on 12/01/2016 01/19/14   Street, Normanna, PA-C  naproxen (NAPROSYN) 500 MG tablet Take 1 tablet (500 mg total) by mouth 2 (two) times daily. Patient not taking: Reported on 12/01/2016 07/04/15   Dowless, Aldona Bar Tripp, PA-C  ondansetron (ZOFRAN) 4 MG tablet Take 1 tablet (4 mg total) every 6 (six) hours by mouth. 12/01/16   Caccavale, Sophia, PA-C  permethrin (ELIMITE) 5 %  cream Apply to affected area once, leave for 8 hours then shower Patient not taking: Reported on 12/01/2016 05/19/14   Presson, Audelia Hives, PA  polyethylene glycol powder (GLYCOLAX/MIRALAX) powder Take 1-2 capfuls daily until you have normal bowel movements 12/01/16   Caccavale, Sophia, PA-C  predniSONE (DELTASONE) 10 MG tablet 6 day step down dose Patient not taking: Reported on 12/01/2016 05/22/14   Glendell Docker, NP  terbinafine (LAMISIL AT) 1 % cream Apply 1 application topically 2 (two) times daily. Patient not taking: Reported on 12/01/2016 06/03/14   Larene Pickett, PA-C  terconazole (TERAZOL 7) 0.4 % vaginal cream Place 1 applicator vaginally at bedtime. Patient not taking: Reported on 12/01/2016 12/15/12   Cyndee Brightly, CNM    Allergies    Patient has no known allergies.  Review of Systems   Review of Systems  Gastrointestinal: Positive for diarrhea, nausea and vomiting.  All other systems reviewed and are negative.   Physical Exam Updated Vital Signs BP (!) 144/80   Pulse (!) 104   Temp 98.4 F (36.9 C) (Oral)   Resp 18   LMP 02/03/2020 (Approximate)   SpO2 97%   Physical Exam Vitals and nursing note reviewed.  Constitutional:      Appearance: She is well-developed and well-nourished.  HENT:     Head: Normocephalic and atraumatic.     Mouth/Throat:     Mouth: Oropharynx is clear and moist.     Comments: dry Eyes:     Extraocular Movements: EOM normal.     Conjunctiva/sclera: Conjunctivae normal.     Pupils: Pupils are equal, round, and reactive to light.  Cardiovascular:     Rate and Rhythm: Normal rate and regular rhythm.     Heart sounds: Normal heart sounds.  Pulmonary:     Effort: Pulmonary effort is normal. No respiratory distress.     Breath sounds: Normal breath sounds. No rhonchi.  Abdominal:     General: Bowel sounds are normal.     Palpations: Abdomen is soft.     Tenderness: There is no abdominal tenderness. There is no rebound.   Musculoskeletal:        General: Normal range of motion.     Cervical back: Normal range of motion.  Skin:    General: Skin is warm and dry.  Neurological:     Mental Status: She is alert and oriented to person, place, and time.  Psychiatric:        Mood and Affect: Mood and affect normal.     ED Results / Procedures / Treatments   Labs (all labs ordered are listed, but only abnormal results are displayed)  Labs Reviewed  COMPREHENSIVE METABOLIC PANEL - Abnormal; Notable for the following components:      Result Value   Potassium 2.9 (*)    Chloride 95 (*)    CO2 18 (*)    Glucose, Bld 150 (*)    Total Protein 8.5 (*)    Total Bilirubin 1.6 (*)    Anion gap 22 (*)    All other components within normal limits  CBC - Abnormal; Notable for the following components:   WBC 11.7 (*)    Platelets 634 (*)    All other components within normal limits  SARS CORONAVIRUS 2 (TAT 6-24 HRS)  LIPASE, BLOOD  URINALYSIS, ROUTINE W REFLEX MICROSCOPIC  I-STAT BETA HCG BLOOD, ED ([facility], [facility], [facility] ONLY)    EKG None  Radiology No results found.  Procedures Procedures (including critical care time)  Medications Ordered in ED Medications  potassium chloride SA (KLOR-CON) CR tablet 40 mEq (has no administration in time range)  sodium chloride 0.9 % bolus 1,000 mL (has no administration in time range)  ondansetron (ZOFRAN) injection 4 mg (has no administration in time range)  ondansetron (ZOFRAN-ODT) disintegrating tablet 4 mg (4 mg Oral Given 02/10/20 1846)    ED Course  I have reviewed the triage vital signs and the nursing notes.  Pertinent labs & imaging results that were available during my care of the patient were reviewed by me and considered in my medical decision making (see chart for details).    MDM Rules/Calculators/A&P  35 year old female here with 1 week of nausea, vomiting, and diarrhea.  Her diarrhea has since resolved.  Denies any fever or chills.  No sick contacts or  known COVID exposures.  Afebrile and nontoxic here but does appear to be feeling poorly.  She does appear clinically dry.  Labs as above--does have an elevated anion gap at 22 and bicarb of 18.  Her glucose is only 150.  Suspect this is from vomiting and not DKA.  Potassium is low at 2.9, also suspected from vomiting.  We will replace along with IV fluids, Zofran and attempt to p.o. challenge.  covid test sent.  6:30 AM Patient awaiting IV placement and medications/IVF.  Care will be signed out to oncoming team to reassess.  Anticipate discharge home if symptoms improved and tolerating PO.  Final Clinical Impression(s) / ED Diagnoses Final diagnoses:  Non-intractable vomiting with nausea, unspecified vomiting type    Rx / DC Orders ED Discharge Orders    None       Larene Pickett, PA-C 02/11/20 0630    Margette Fast, MD 02/11/20 [phone]

## 2020-02-11 NOTE — Discharge Instructions (Signed)
Can take zofran as needed for nausea/vomiting. Try to push oral fluids as much as you can.  Gentle diet for now and progress back to normal as tolerated. Your covid test should come back in about 24 hours, you will be contacted if positive. Follow-up with your primary care doctor. Return here for new concerns.

## 2020-02-11 NOTE — ED Notes (Signed)
Pt ambulatory with steady gait to restroom and provided UA. Sent UA to lab. Provided pt with water and crackers for PO challenge.

## 2020-02-12 ENCOUNTER — Inpatient Hospital Stay (HOSPITAL_COMMUNITY)
Admission: EM | Admit: 2020-02-12 | Discharge: 2020-02-28 | DRG: 023 | Disposition: A | Discharge status: Rehabilitation Facility | Payer: 59 | Attending: Neurology | Admitting: Neurology

## 2020-02-12 ENCOUNTER — Emergency Department (HOSPITAL_COMMUNITY): Payer: 59

## 2020-02-12 ENCOUNTER — Encounter (HOSPITAL_COMMUNITY): Payer: Self-pay

## 2020-02-12 ENCOUNTER — Inpatient Hospital Stay (HOSPITAL_COMMUNITY): Payer: 59

## 2020-02-12 DIAGNOSIS — G9349 Other encephalopathy: Secondary | ICD-10-CM | POA: Diagnosis present

## 2020-02-12 DIAGNOSIS — G8191 Hemiplegia, unspecified affecting right dominant side: Secondary | ICD-10-CM | POA: Diagnosis present

## 2020-02-12 DIAGNOSIS — R569 Unspecified convulsions: Secondary | ICD-10-CM

## 2020-02-12 DIAGNOSIS — E860 Dehydration: Secondary | ICD-10-CM | POA: Diagnosis present

## 2020-02-12 DIAGNOSIS — I6901 Attention and concentration deficit following nontraumatic subarachnoid hemorrhage: Secondary | ICD-10-CM | POA: Diagnosis not present

## 2020-02-12 DIAGNOSIS — I636 Cerebral infarction due to cerebral venous thrombosis, nonpyogenic: Secondary | ICD-10-CM | POA: Diagnosis present

## 2020-02-12 DIAGNOSIS — Y92239 Unspecified place in hospital as the place of occurrence of the external cause: Secondary | ICD-10-CM | POA: Diagnosis not present

## 2020-02-12 DIAGNOSIS — I495 Sick sinus syndrome: Secondary | ICD-10-CM | POA: Diagnosis present

## 2020-02-12 DIAGNOSIS — G08 Intracranial and intraspinal phlebitis and thrombophlebitis: Secondary | ICD-10-CM

## 2020-02-12 DIAGNOSIS — R29810 Facial weakness: Secondary | ICD-10-CM | POA: Diagnosis present

## 2020-02-12 DIAGNOSIS — Z87891 Personal history of nicotine dependence: Secondary | ICD-10-CM | POA: Diagnosis not present

## 2020-02-12 DIAGNOSIS — I69018 Other symptoms and signs involving cognitive functions following nontraumatic subarachnoid hemorrhage: Secondary | ICD-10-CM | POA: Diagnosis not present

## 2020-02-12 DIAGNOSIS — I8290 Acute embolism and thrombosis of unspecified vein: Secondary | ICD-10-CM

## 2020-02-12 DIAGNOSIS — J9690 Respiratory failure, unspecified, unspecified whether with hypoxia or hypercapnia: Secondary | ICD-10-CM

## 2020-02-12 DIAGNOSIS — R414 Neurologic neglect syndrome: Secondary | ICD-10-CM | POA: Diagnosis not present

## 2020-02-12 DIAGNOSIS — I6330 Cerebral infarction due to thrombosis of unspecified cerebral artery: Secondary | ICD-10-CM | POA: Insufficient documentation

## 2020-02-12 DIAGNOSIS — H5340 Unspecified visual field defects: Secondary | ICD-10-CM | POA: Diagnosis present

## 2020-02-12 DIAGNOSIS — D62 Acute posthemorrhagic anemia: Secondary | ICD-10-CM | POA: Diagnosis not present

## 2020-02-12 DIAGNOSIS — D573 Sickle-cell trait: Secondary | ICD-10-CM | POA: Diagnosis present

## 2020-02-12 DIAGNOSIS — I6602 Occlusion and stenosis of left middle cerebral artery: Secondary | ICD-10-CM | POA: Diagnosis present

## 2020-02-12 DIAGNOSIS — Z79899 Other long term (current) drug therapy: Secondary | ICD-10-CM | POA: Diagnosis not present

## 2020-02-12 DIAGNOSIS — R5383 Other fatigue: Secondary | ICD-10-CM | POA: Diagnosis not present

## 2020-02-12 DIAGNOSIS — K529 Noninfective gastroenteritis and colitis, unspecified: Secondary | ICD-10-CM | POA: Diagnosis present

## 2020-02-12 DIAGNOSIS — E876 Hypokalemia: Secondary | ICD-10-CM | POA: Diagnosis present

## 2020-02-12 DIAGNOSIS — I69011 Memory deficit following nontraumatic subarachnoid hemorrhage: Secondary | ICD-10-CM | POA: Diagnosis not present

## 2020-02-12 DIAGNOSIS — H7090 Unspecified mastoiditis, unspecified ear: Secondary | ICD-10-CM | POA: Diagnosis present

## 2020-02-12 DIAGNOSIS — Z832 Family history of diseases of the blood and blood-forming organs and certain disorders involving the immune mechanism: Secondary | ICD-10-CM | POA: Diagnosis not present

## 2020-02-12 DIAGNOSIS — R069 Unspecified abnormalities of breathing: Secondary | ICD-10-CM

## 2020-02-12 DIAGNOSIS — G9340 Encephalopathy, unspecified: Secondary | ICD-10-CM | POA: Diagnosis not present

## 2020-02-12 DIAGNOSIS — Z0189 Encounter for other specified special examinations: Secondary | ICD-10-CM

## 2020-02-12 DIAGNOSIS — I608 Other nontraumatic subarachnoid hemorrhage: Secondary | ICD-10-CM | POA: Diagnosis not present

## 2020-02-12 DIAGNOSIS — G936 Cerebral edema: Secondary | ICD-10-CM | POA: Diagnosis not present

## 2020-02-12 DIAGNOSIS — Z7952 Long term (current) use of systemic steroids: Secondary | ICD-10-CM | POA: Diagnosis not present

## 2020-02-12 DIAGNOSIS — J45909 Unspecified asthma, uncomplicated: Secondary | ICD-10-CM | POA: Diagnosis present

## 2020-02-12 DIAGNOSIS — Z20822 Contact with and (suspected) exposure to covid-19: Secondary | ICD-10-CM | POA: Diagnosis present

## 2020-02-12 DIAGNOSIS — I69098 Other sequelae following nontraumatic subarachnoid hemorrhage: Secondary | ICD-10-CM | POA: Diagnosis not present

## 2020-02-12 DIAGNOSIS — Z9289 Personal history of other medical treatment: Secondary | ICD-10-CM

## 2020-02-12 DIAGNOSIS — R4189 Other symptoms and signs involving cognitive functions and awareness: Secondary | ICD-10-CM | POA: Diagnosis present

## 2020-02-12 DIAGNOSIS — I959 Hypotension, unspecified: Secondary | ICD-10-CM | POA: Diagnosis not present

## 2020-02-12 DIAGNOSIS — J9601 Acute respiratory failure with hypoxia: Secondary | ICD-10-CM | POA: Diagnosis not present

## 2020-02-12 DIAGNOSIS — J95821 Acute postprocedural respiratory failure: Secondary | ICD-10-CM | POA: Diagnosis not present

## 2020-02-12 DIAGNOSIS — W19XXXA Unspecified fall, initial encounter: Secondary | ICD-10-CM | POA: Diagnosis not present

## 2020-02-12 DIAGNOSIS — O219 Vomiting of pregnancy, unspecified: Secondary | ICD-10-CM

## 2020-02-12 DIAGNOSIS — D6859 Other primary thrombophilia: Secondary | ICD-10-CM | POA: Diagnosis present

## 2020-02-12 DIAGNOSIS — R443 Hallucinations, unspecified: Secondary | ICD-10-CM | POA: Diagnosis present

## 2020-02-12 LAB — URINALYSIS, ROUTINE W REFLEX MICROSCOPIC
Bilirubin Urine: NEGATIVE
Glucose, UA: 50 mg/dL — AB
Hgb urine dipstick: NEGATIVE
Ketones, ur: 80 mg/dL — AB
Leukocytes,Ua: NEGATIVE
Nitrite: NEGATIVE
Protein, ur: 100 mg/dL — AB
Specific Gravity, Urine: 1.024 (ref 1.005–1.030)
pH: 6 (ref 5.0–8.0)

## 2020-02-12 LAB — CBC
HCT: 37 % (ref 36.0–46.0)
Hemoglobin: 12.6 g/dL (ref 12.0–15.0)
MCH: 30 pg (ref 26.0–34.0)
MCHC: 34.1 g/dL (ref 30.0–36.0)
MCV: 88.1 fL (ref 80.0–100.0)
Platelets: 578 10*3/uL — ABNORMAL HIGH (ref 150–400)
RBC: 4.2 MIL/uL (ref 3.87–5.11)
RDW: 13.2 % (ref 11.5–15.5)
WBC: 13.6 10*3/uL — ABNORMAL HIGH (ref 4.0–10.5)
nRBC: 0 % (ref 0.0–0.2)

## 2020-02-12 LAB — RAPID URINE DRUG SCREEN, HOSP PERFORMED
Amphetamines: NOT DETECTED
Barbiturates: NOT DETECTED
Benzodiazepines: NOT DETECTED
Cocaine: NOT DETECTED
Opiates: NOT DETECTED
Tetrahydrocannabinol: NOT DETECTED

## 2020-02-12 LAB — I-STAT BETA HCG BLOOD, ED (MC, WL, AP ONLY): I-stat hCG, quantitative: <5 m[IU]/mL (ref <5)

## 2020-02-12 LAB — HEPARIN LEVEL (UNFRACTIONATED): Heparin Unfractionated: <0.1 IU/mL — ABNORMAL LOW (ref 0.30–0.70)

## 2020-02-12 LAB — GLUCOSE, CAPILLARY
Glucose-Capillary: 110 mg/dL — ABNORMAL HIGH (ref 70–99)
Glucose-Capillary: 115 mg/dL — ABNORMAL HIGH (ref 70–99)

## 2020-02-12 LAB — CBG MONITORING, ED
Glucose-Capillary: 161 mg/dL — ABNORMAL HIGH (ref 70–99)
Glucose-Capillary: 110 mg/dL — ABNORMAL HIGH (ref 70–99)
Glucose-Capillary: 105 mg/dL — ABNORMAL HIGH (ref 70–99)

## 2020-02-12 LAB — HEMOGLOBIN A1C
Hgb A1c MFr Bld: 4.9 % (ref 4.8–5.6)
Mean Plasma Glucose: 93.93 mg/dL

## 2020-02-12 LAB — COMPREHENSIVE METABOLIC PANEL
ALT: 30 U/L (ref 0–44)
AST: 16 U/L (ref 15–41)
Albumin: 3.6 g/dL (ref 3.5–5.0)
Alkaline Phosphatase: 52 U/L (ref 38–126)
Anion gap: 14 (ref 5–15)
BUN: 9 mg/dL (ref 6–20)
CO2: 20 mmol/L — ABNORMAL LOW (ref 22–32)
Calcium: 9.3 mg/dL (ref 8.9–10.3)
Chloride: 103 mmol/L (ref 98–111)
Creatinine, Ser: 0.61 mg/dL (ref 0.44–1.00)
GFR, Estimated: >60 mL/min (ref >60)
Glucose, Bld: 168 mg/dL — ABNORMAL HIGH (ref 70–99)
Potassium: 3.5 mmol/L (ref 3.5–5.1)
Sodium: 137 mmol/L (ref 135–145)
Total Bilirubin: 1 mg/dL (ref 0.3–1.2)
Total Protein: 8.3 g/dL — ABNORMAL HIGH (ref 6.5–8.1)

## 2020-02-12 LAB — PROTIME-INR
INR: 1.2 (ref 0.8–1.2)
Prothrombin Time: 14.7 seconds (ref 11.4–15.2)

## 2020-02-12 LAB — HIV ANTIBODY (ROUTINE TESTING W REFLEX): HIV Screen 4th Generation wRfx: NONREACTIVE

## 2020-02-12 LAB — RESP PANEL BY RT-PCR (FLU A&B, COVID) ARPGX2
Influenza A by PCR: NEGATIVE
Influenza B by PCR: NEGATIVE
SARS Coronavirus 2 by RT PCR: NEGATIVE

## 2020-02-12 LAB — ACETAMINOPHEN LEVEL: Acetaminophen (Tylenol), Serum: <10 ug/mL — ABNORMAL LOW (ref 10–30)

## 2020-02-12 LAB — SALICYLATE LEVEL: Salicylate Lvl: <7 mg/dL — ABNORMAL LOW (ref 7.0–30.0)

## 2020-02-12 LAB — ETHANOL: Alcohol, Ethyl (B): <10 mg/dL (ref <10)

## 2020-02-12 LAB — ANTITHROMBIN III: AntiThromb III Func: 108 % (ref 75–120)

## 2020-02-12 IMAGING — MR MR HEAD W/O CM
12 of 13 series · 44 of 48 positions shown · non-contrast
Comparison: Head CT from earlier today

CLINICAL DATA: Mental status change with unknown cause.

EXAM:
MRI HEAD WITHOUT CONTRAST
MRV HEAD WITHOUT CONTRAST
TECHNIQUE: Multiplanar, multiecho pulse sequences of the brain and surrounding
structures were obtained without intravenous contrast. Angiographic
images of the intracranial venous structures were obtained using MRV
technique without intravenous contrast.

[Series 5: DWI · axial · 3.0mm · 0.88mm/px · z∈[-125,+16]mm · 8 of 96 slices shown (1 of 4)]
[im 1/96]
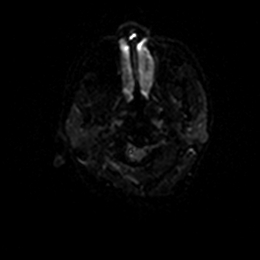
[im 14/96]
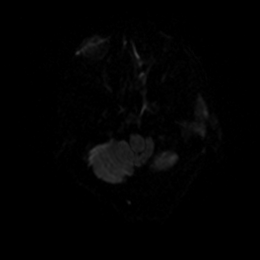
[im 28/96]
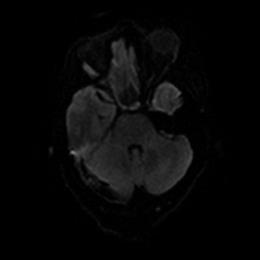
[im 41/96]
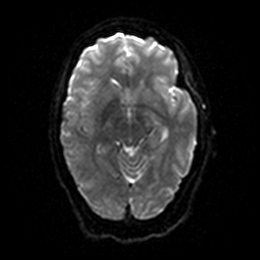
[im 55/96]
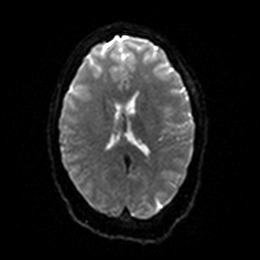
[im 68/96]
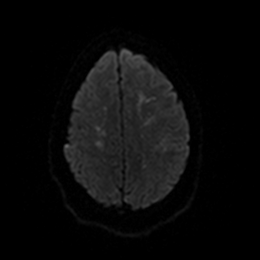
[im 82/96]
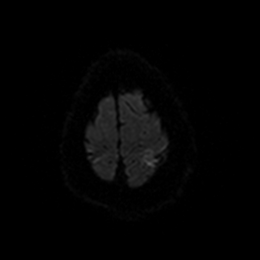
[im 96/96]
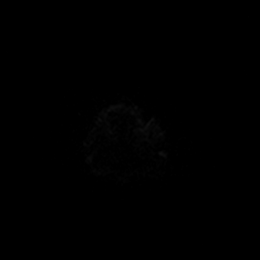

[Series 6: DWI · axial · 3.0mm · 0.88mm/px · z∈[-125,+16]mm · 4 of 48 slices shown (2 of 4)]
[im 1/48]
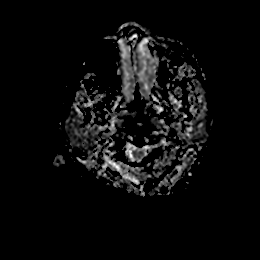
[im 16/48]
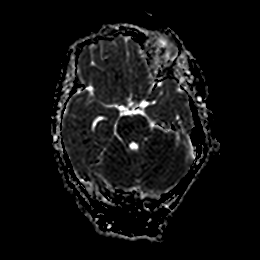
[im 32/48]
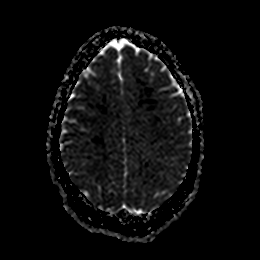
[im 48/48]
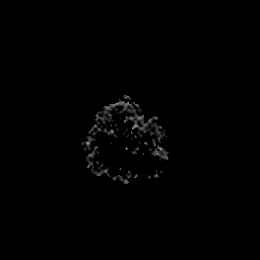

[Series 7: DWI · coronal · 4.0mm · 0.88mm/px · 5 of 68 slices shown (3 of 4)]
[im 1/68]
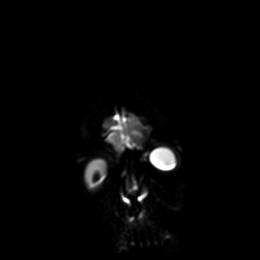
[im 17/68]
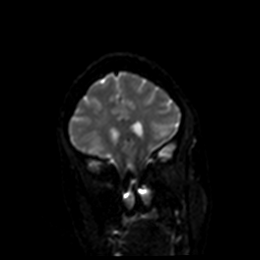
[im 34/68]
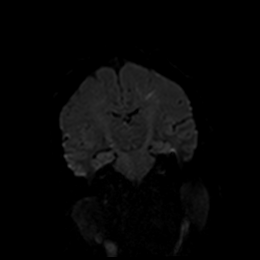
[im 51/68]
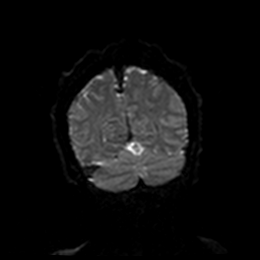
[im 68/68]
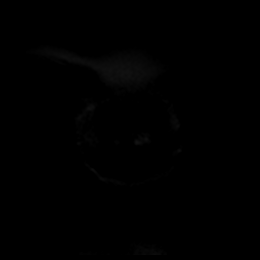

[Series 8: DWI · coronal · 4.0mm · 0.88mm/px · 3 of 34 slices shown (4 of 4)]
[im 1/34]
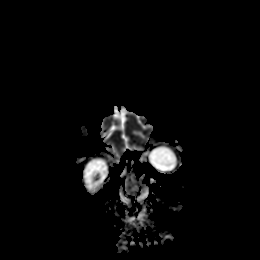
[im 17/34]
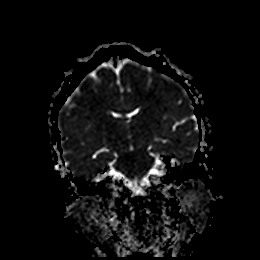
[im 34/34]
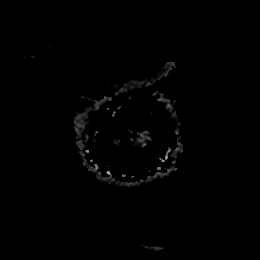

[Series 9: T1 · sagittal · 5.0mm · 0.75mm/px · 2 of 23 slices shown]
[im 1/23]
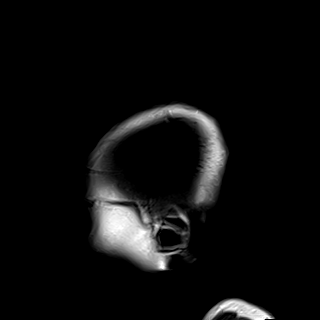
[im 23/23]
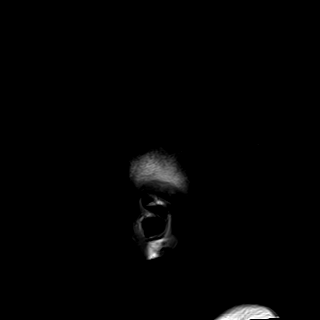

[Series 10: T2 · axial · 5.0mm · 0.72mm/px · z∈[-131,+13]mm · 2 of 25 slices shown (1 of 2)]
[im 1/25]
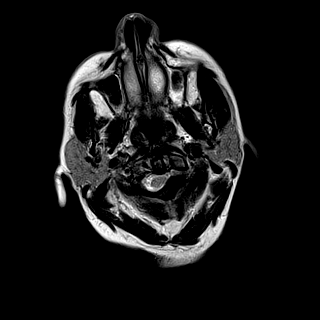
[im 25/25]
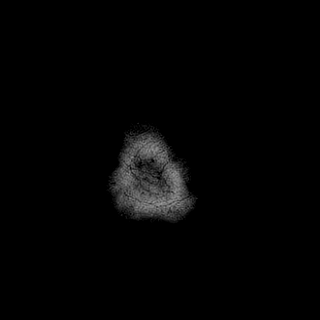

[Series 11: FLAIR · axial · 5.0mm · 0.45mm/px · z∈[-130,+13]mm · 2 of 25 slices shown]
[im 1/25]
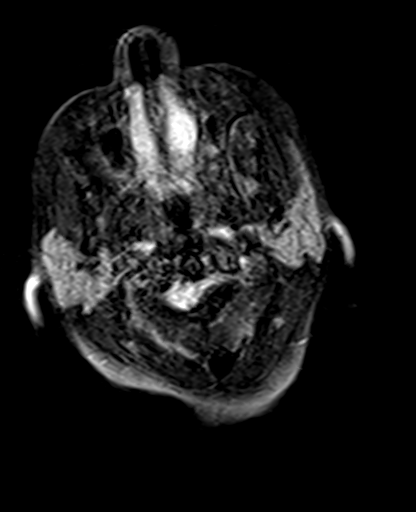
[im 25/25]
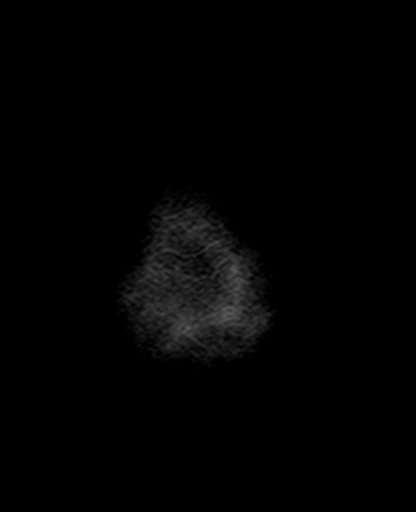

[Series 12: mag_images · axial · 3.0mm · 0.90mm/px · z∈[-135,+18]mm · 4 of 52 slices shown]
[im 1/52]
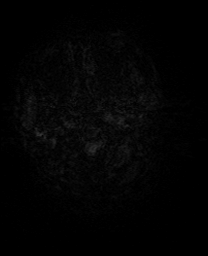
[im 18/52]
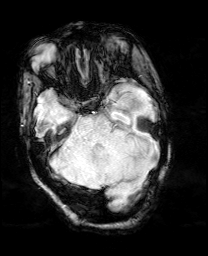
[im 35/52]
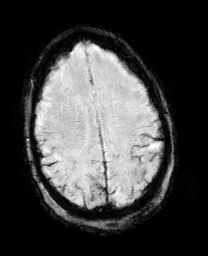
[im 52/52]
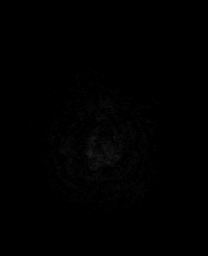

[Series 13: pha_images · axial · 3.0mm · 0.90mm/px · z∈[-135,+18]mm · 4 of 52 slices shown]
[im 1/52]
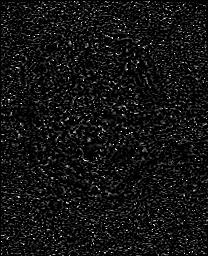
[im 18/52]
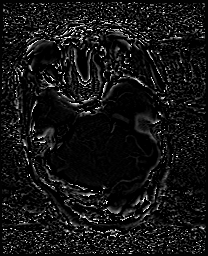
[im 35/52]
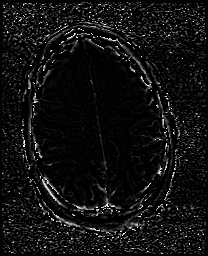
[im 52/52]
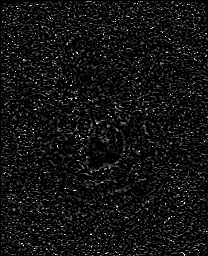

[Series 14: swi_images · axial · 3.0mm · 0.90mm/px · z∈[-135,+18]mm · 4 of 52 slices shown]
[im 1/52]
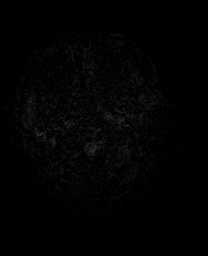
[im 18/52]
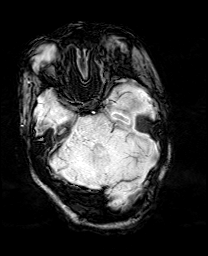
[im 35/52]
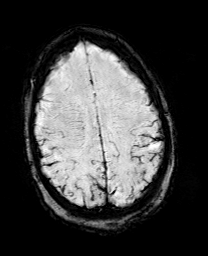
[im 52/52]
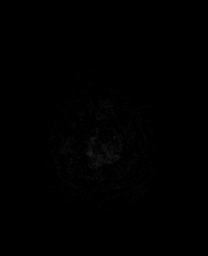

[Series 15: mip_images(sw) · axial · 24.0mm · 0.90mm/px · z∈[-124,+7]mm · 4 of 45 slices shown]
[im 1/45]
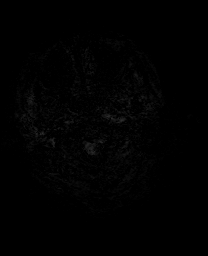
[im 15/45]
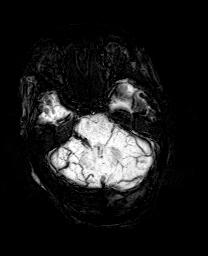
[im 30/45]
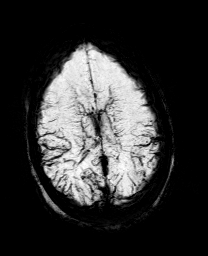
[im 45/45]
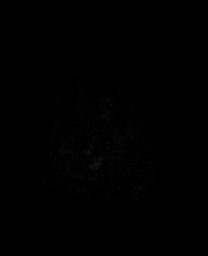

[Series 17: T2 · coronal · 5.0mm · 0.34mm/px · 2 of 29 slices shown (2 of 2)]
[im 1/29]
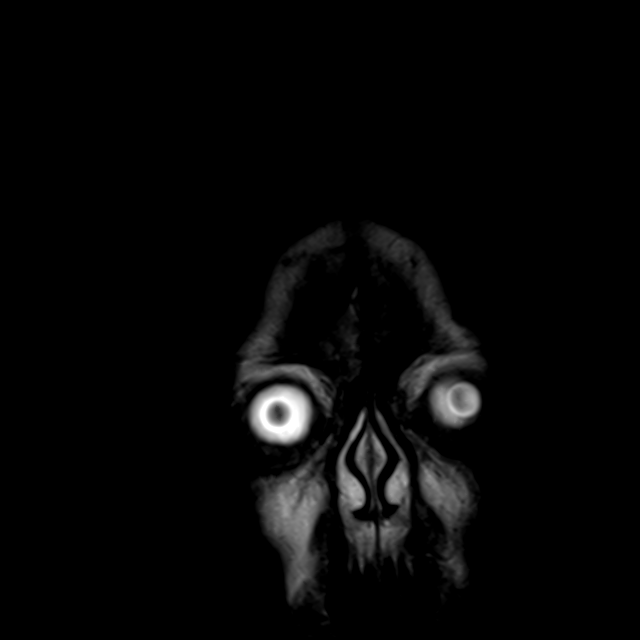
[im 29/29]
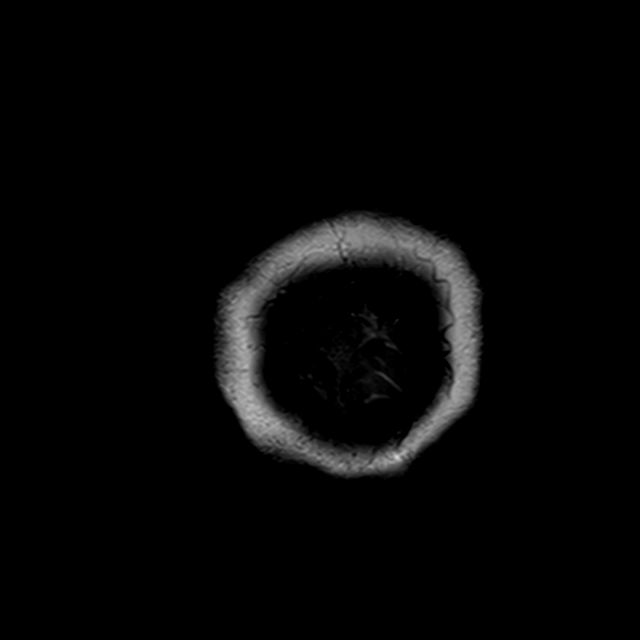

[44 of 48 positions shown; findings below may reference images not displayed]

FINDINGS: MRI HEAD WITHOUT CONTRAST

Brain: Small patchy acute infarcts in the bilateral cerebral white
matter and along the high left sylvian fissure where there is likely
accentuation by susceptibility artifact. These are in a deep
watershed pattern. Limited sulci, even for age which likely reflects
diffuse swelling. Focal swelling is seen to a greater extent than
the infarction along the high perirolandic cortex, left more than
right. Small volume subarachnoid hemorrhage as seen on head CT,
mainly at the areas of greatest swelling. There is marked
intravascular susceptibility signal involving the right more than
left cortical, dural sinus, and deep veins due to intravascular
deoxygenation/slow flow. No hydrocephalus.

Vascular: Dural venous sinus thrombosis with further description
below.

Skull and upper cervical spine: No focal marrow signal abnormality

Sinuses/Orbits: Negative

MR VENOGRAM WITHOUT CONTRAST

Abrupt occlusion of the mid superior sagittal sinus with confluent
thrombus extending to the torcula, right transverse, right sigmoid,
and right upper internal jugular vein. Bilateral vein of [place]
thrombosis. The straight sinus and vein of [place]LL[place]MIN[place] are also non
flowing. No internal cerebral flow on either side.

Critical Value/emergent results were called by telephone at the time
of interpretation on [DATE] at [DATE] to provider WELLHEMINAH ,
who verbally acknowledged these results.
IMPRESSION: 1. Extensive, acute dural venous sinus thrombosis involving the
superior sagittal sinus into the right internal jugular vein via the
dominant right transverse and sigmoid system. Both veins of [place]
are thrombosed and there is straight sinus/deep cerebral thrombosis
as well.
2. Deep watershed white matter infarcts, small volume subarachnoid
hemorrhage, and left more than right frontoparietal cortical
swelling due to #1.

## 2020-02-12 IMAGING — MR MR [PERSON_NAME] HEAD
5 of 7 series · 35 of 48 positions shown · non-contrast
Comparison: Head CT from earlier today

CLINICAL DATA: Mental status change with unknown cause.

EXAM:
MRI HEAD WITHOUT CONTRAST
MRV HEAD WITHOUT CONTRAST
TECHNIQUE: Multiplanar, multiecho pulse sequences of the brain and surrounding
structures were obtained without intravenous contrast. Angiographic
images of the intracranial venous structures were obtained using MRV
technique without intravenous contrast.

[Series 5: tof_fl2d_paracor · coronal · 2.0mm · 0.98mm/px · 5 of 128 slices shown]
[im 1/128]
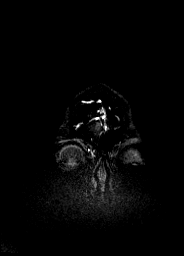
[im 32/128]
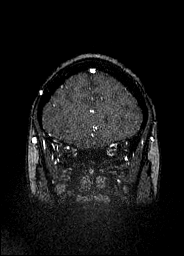
[im 64/128]
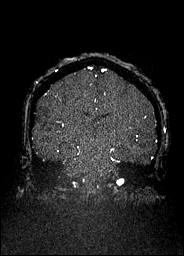
[im 96/128]
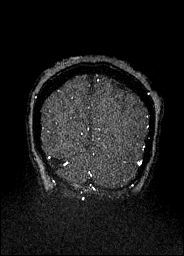
[im 128/128]
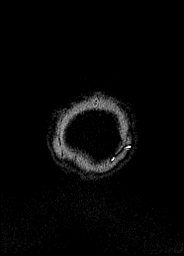

[Series 9: venous inhance coronal · coronal · portal-venous · 0.9mm · 0.57mm/px · 9 of 192 slices shown]
[im 1/192]
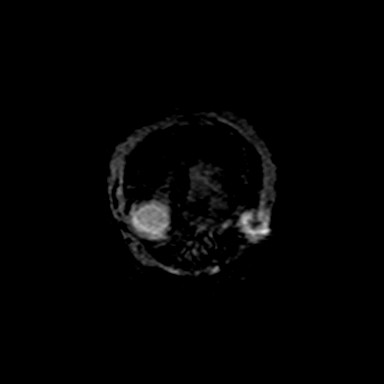
[im 24/192]
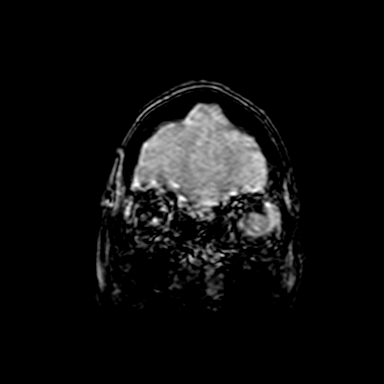
[im 48/192]
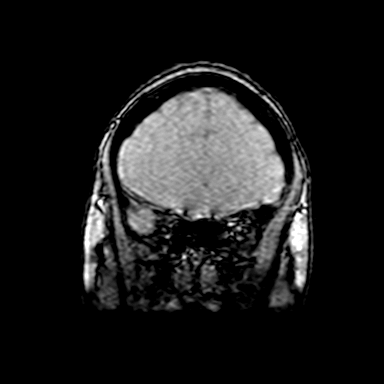
[im 72/192]
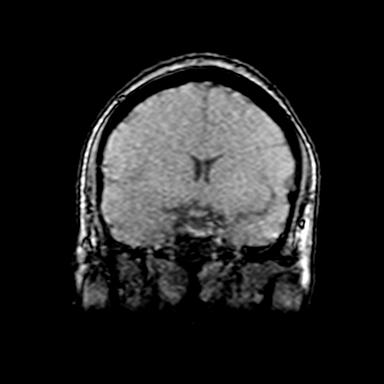
[im 96/192]
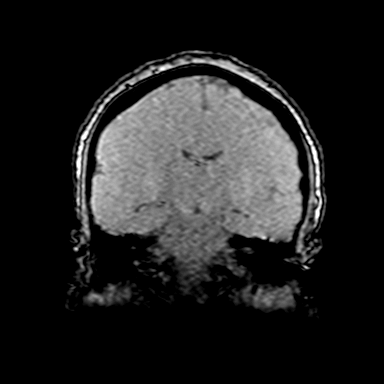
[im 120/192]
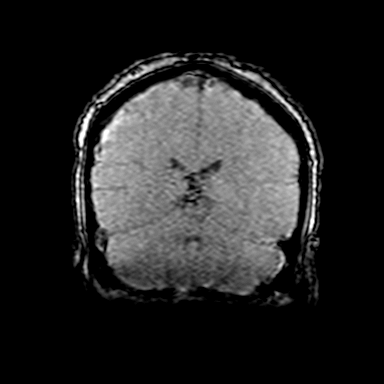
[im 144/192]
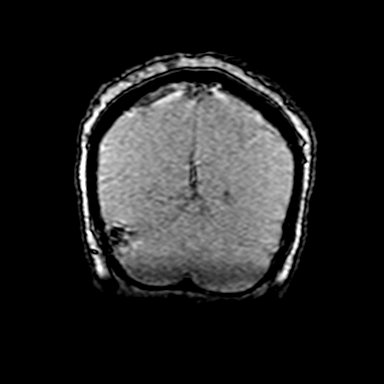
[im 168/192]
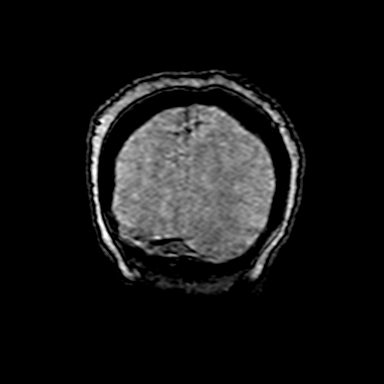
[im 192/192]
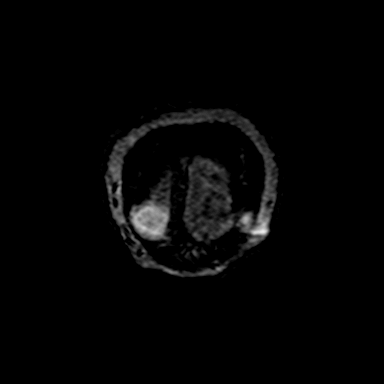

[Series 10: venous inhance coronal_msum · coronal · portal-venous · 0.9mm · 0.57mm/px · 9 of 192 slices shown]
[im 1/192]
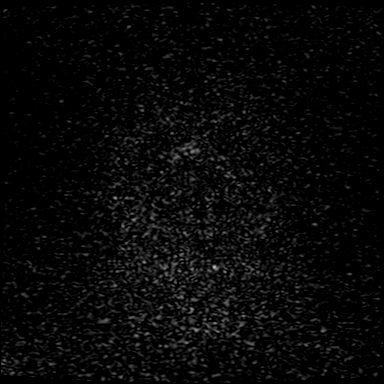
[im 24/192]
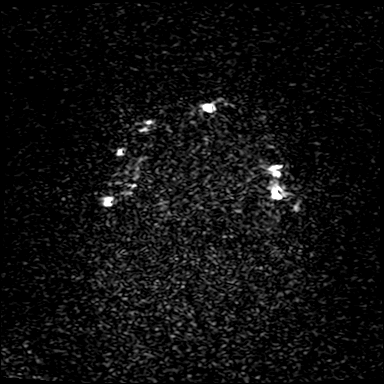
[im 48/192]
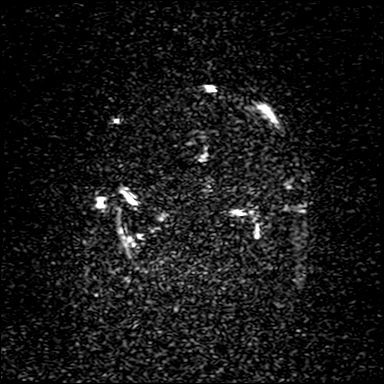
[im 72/192]
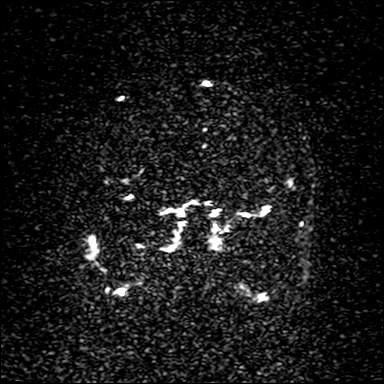
[im 96/192]
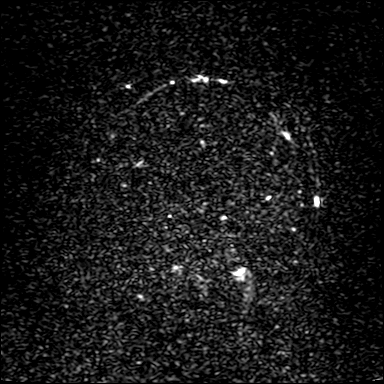
[im 120/192]
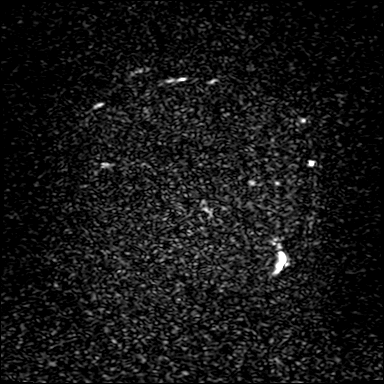
[im 144/192]
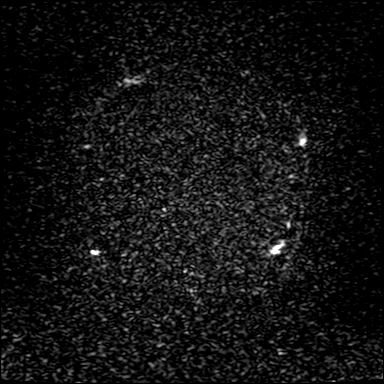
[im 168/192]
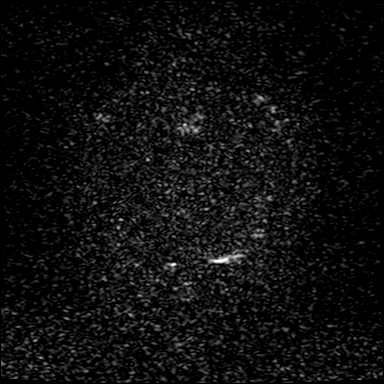
[im 192/192]
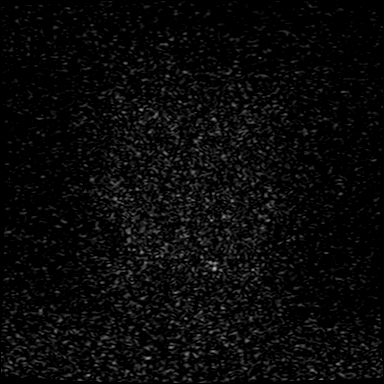

[Series 13: t1_fl3d_sag_p2_iso · sagittal · 1.0mm · 0.98mm/px · 7 of 144 slices shown]
[im 1/144]
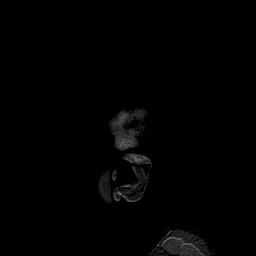
[im 24/144]
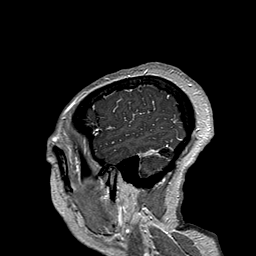
[im 48/144]
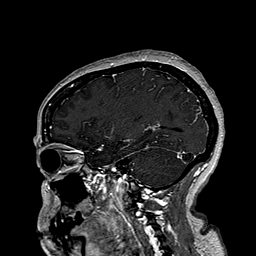
[im 72/144]
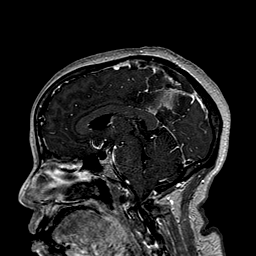
[im 96/144]
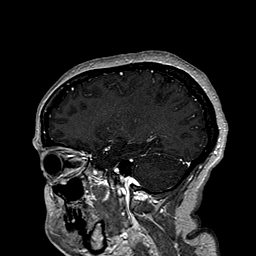
[im 120/144]
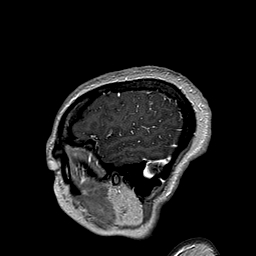
[im 144/144]
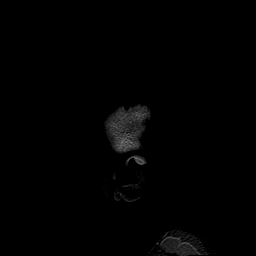

[Series 14: t1_fl3d_sag_p2_iso_mpr_ axial · axial · 2.0mm · 0.45mm/px · z∈[-187,+1]mm · 5 of 120 slices shown]
[im 1/120]
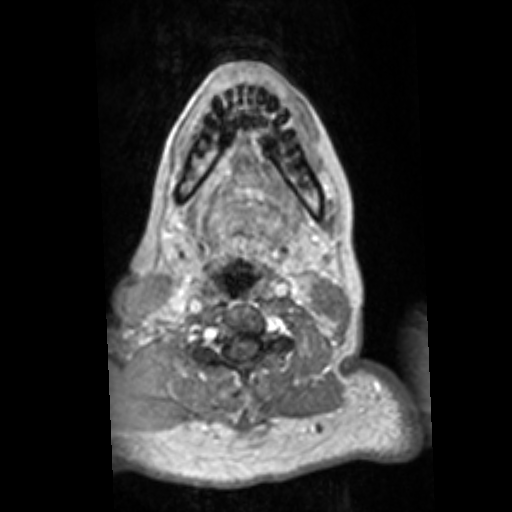
[im 24/120]
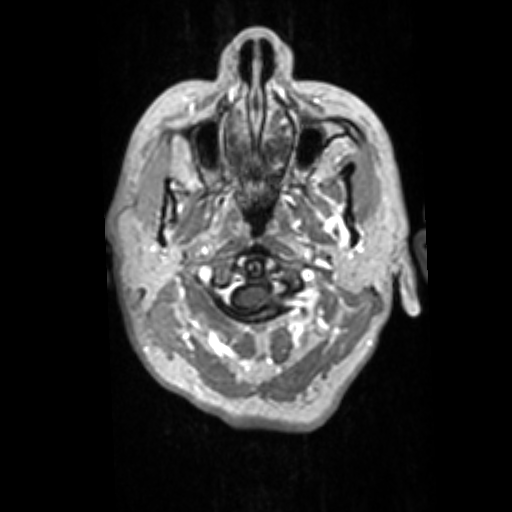
[im 48/120]
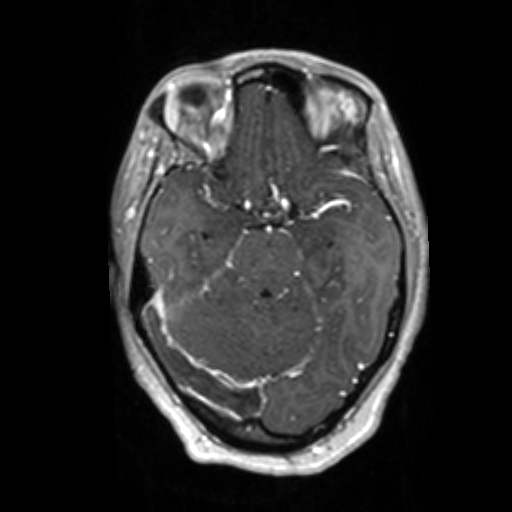
[im 72/120]
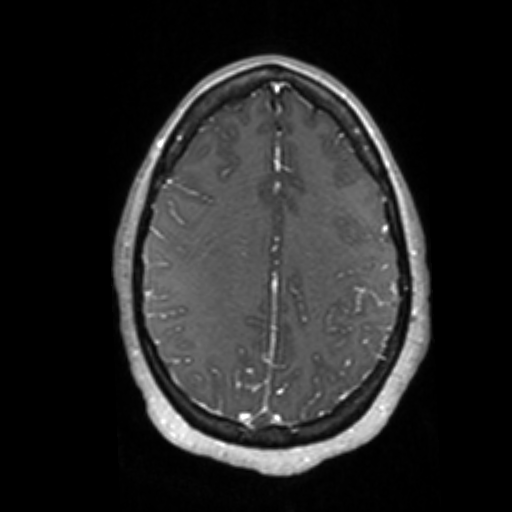
[im 96/120]
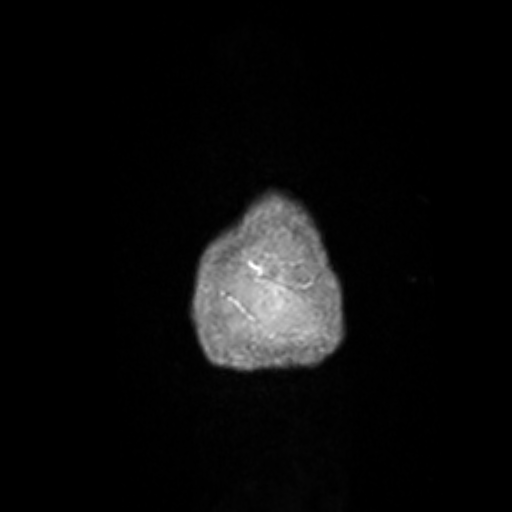

[35 of 48 positions shown; findings below may reference images not displayed]

FINDINGS: MRI HEAD WITHOUT CONTRAST

Brain: Small patchy acute infarcts in the bilateral cerebral white
matter and along the high left sylvian fissure where there is likely
accentuation by susceptibility artifact. These are in a deep
watershed pattern. Limited sulci, even for age which likely reflects
diffuse swelling. Focal swelling is seen to a greater extent than
the infarction along the high perirolandic cortex, left more than
right. Small volume subarachnoid hemorrhage as seen on head CT,
mainly at the areas of greatest swelling. There is marked
intravascular susceptibility signal involving the right more than
left cortical, dural sinus, and deep veins due to intravascular
deoxygenation/slow flow. No hydrocephalus.

Vascular: Dural venous sinus thrombosis with further description
below.

Skull and upper cervical spine: No focal marrow signal abnormality

Sinuses/Orbits: Negative

MR VENOGRAM WITHOUT CONTRAST

Abrupt occlusion of the mid superior sagittal sinus with confluent
thrombus extending to the torcula, right transverse, right sigmoid,
and right upper internal jugular vein. Bilateral vein of [place]
thrombosis. The straight sinus and vein of [place]MIN[place] are also non
flowing. No internal cerebral flow on either side.

Critical Value/emergent results were called by telephone at the time
of interpretation on [DATE] at [DATE] to provider WELLHEMINAH ,
who verbally acknowledged these results.
IMPRESSION: 1. Extensive, acute dural venous sinus thrombosis involving the
superior sagittal sinus into the right internal jugular vein via the
dominant right transverse and sigmoid system. Both veins of [place]
are thrombosed and there is straight sinus/deep cerebral thrombosis
as well.
2. Deep watershed white matter infarcts, small volume subarachnoid
hemorrhage, and left more than right frontoparietal cortical
swelling due to #1.

## 2020-02-12 IMAGING — CT CT HEAD W/O CM
4 series · 16 of 47 positions shown, 18 images · non-contrast
Comparison: None.

CLINICAL DATA: Mental status change.

EXAM:
CT HEAD WITHOUT CONTRAST
TECHNIQUE: Contiguous axial images were obtained from the base of the skull
through the vertex without intravenous contrast.

[Series 3: head wo · axial · 0.41mm/px · z∈[-188,-74]mm · 7 of 31 slices shown, 9 images]
[im 4/31  brain]
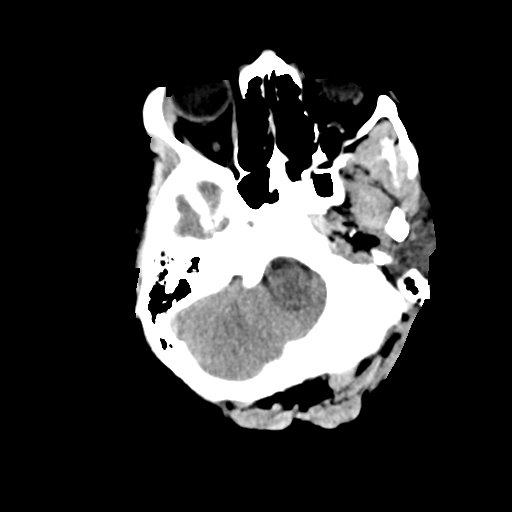
[im 4/31  bone]
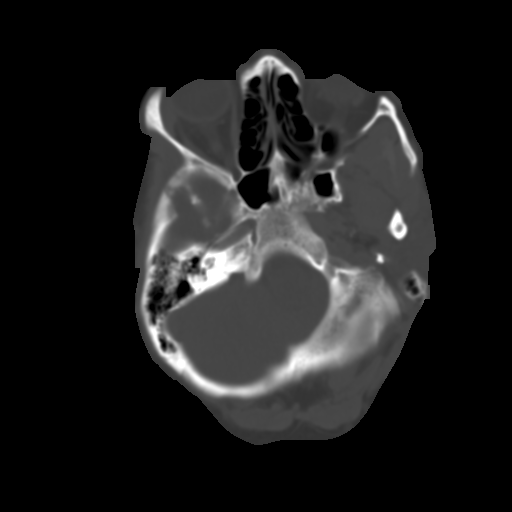
[im 8/31  brain]
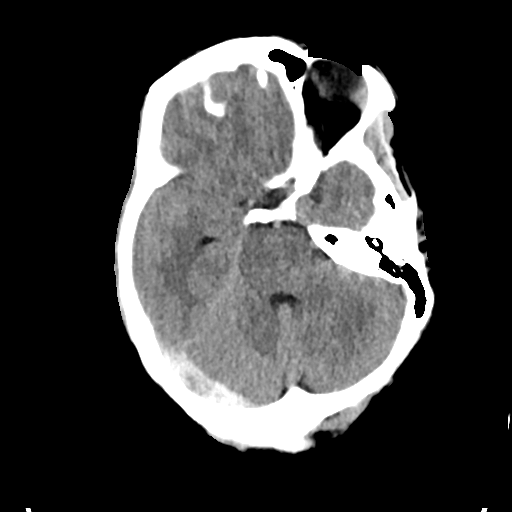
[im 12/31  brain]
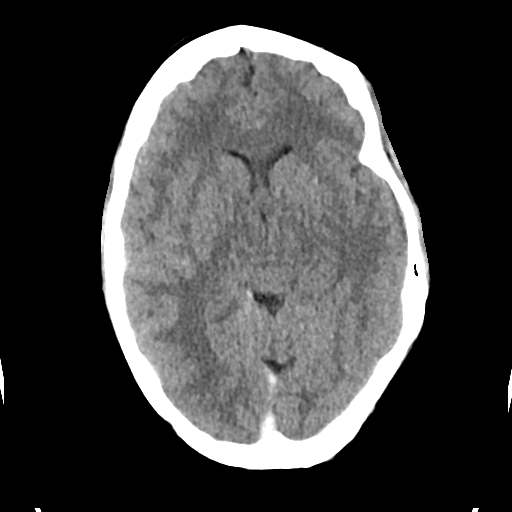
[im 16/31  brain]
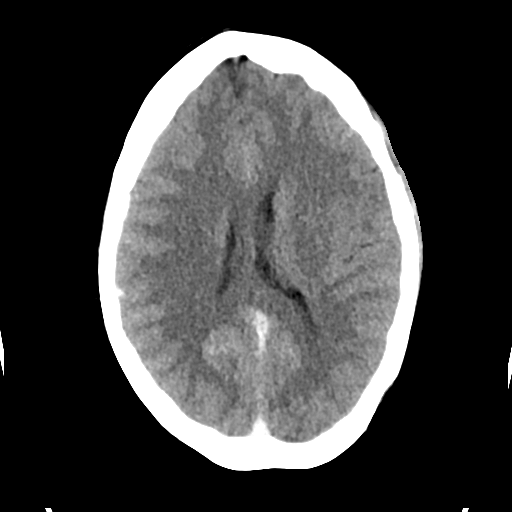
[im 19/31  brain]
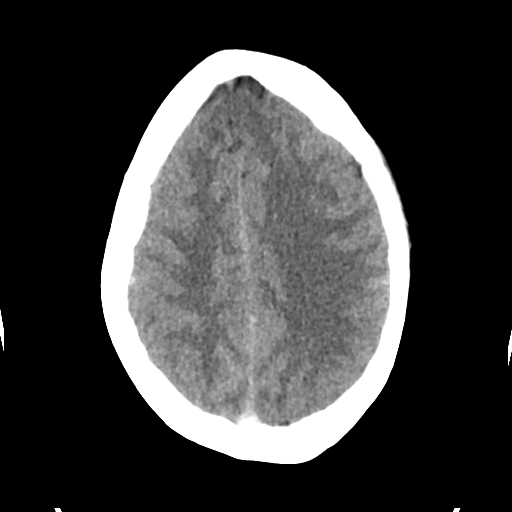
[im 19/31  bone]
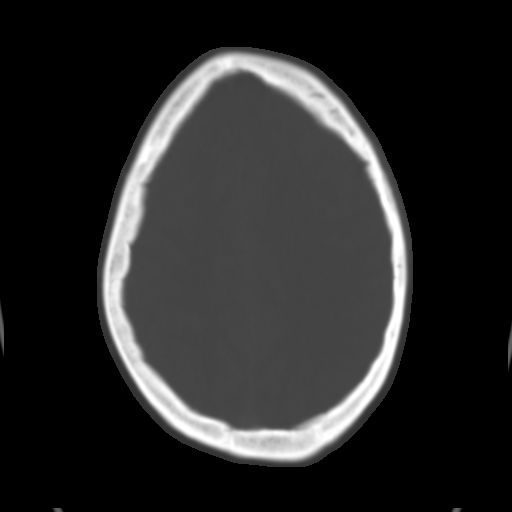
[im 23/31  brain]
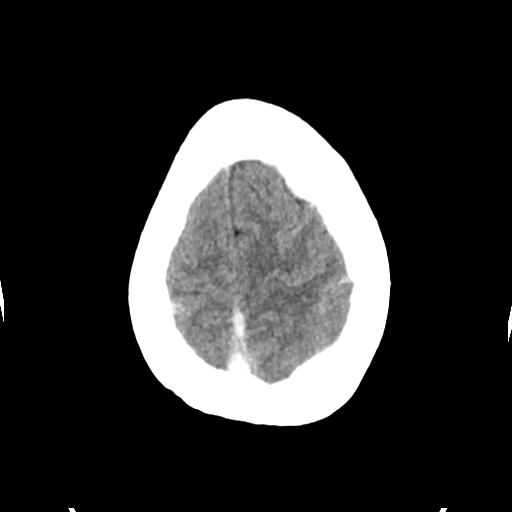
[im 27/31  brain]
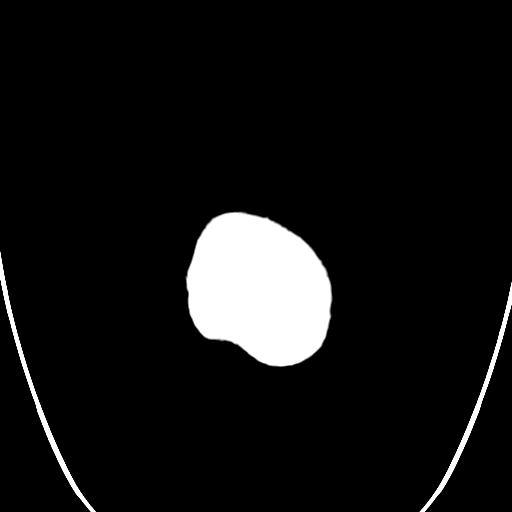

[Series 4: head bone · axial · 0.41mm/px · z∈[-190,-160]mm · 3 of 76 slices shown]
[im 8/76  bone]
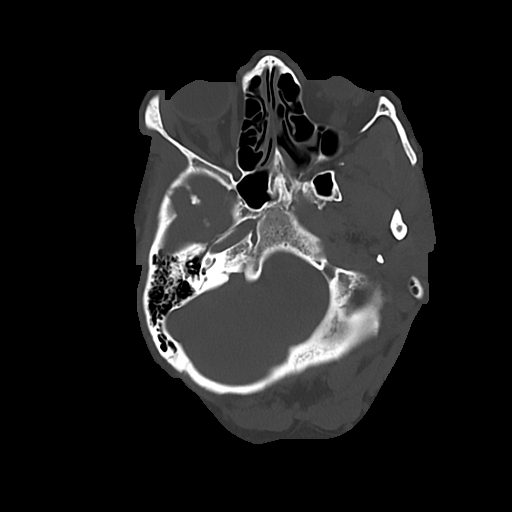
[im 16/76  bone]
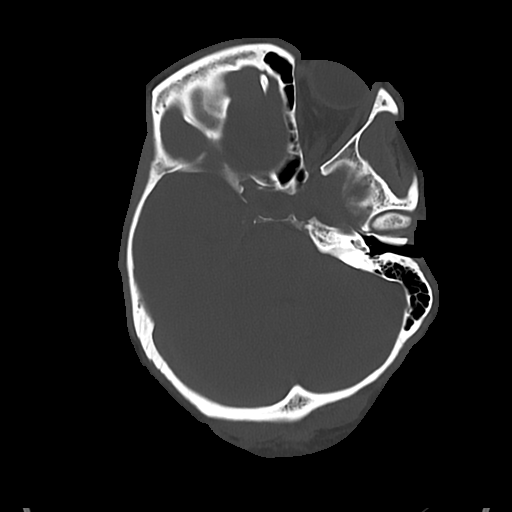
[im 23/76  bone]
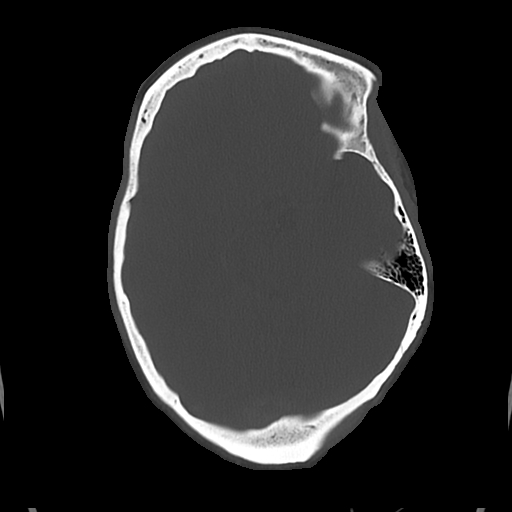

[Series 5: cor soft · coronal · 0.30mm/px · 3 of 65 slices shown]
[im 22/65  brain]
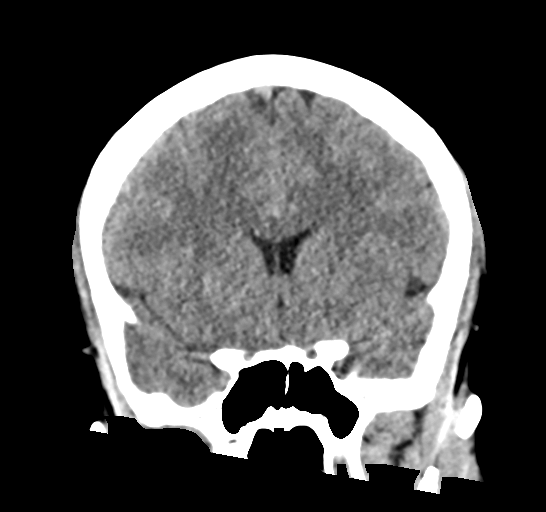
[im 29/65  brain]
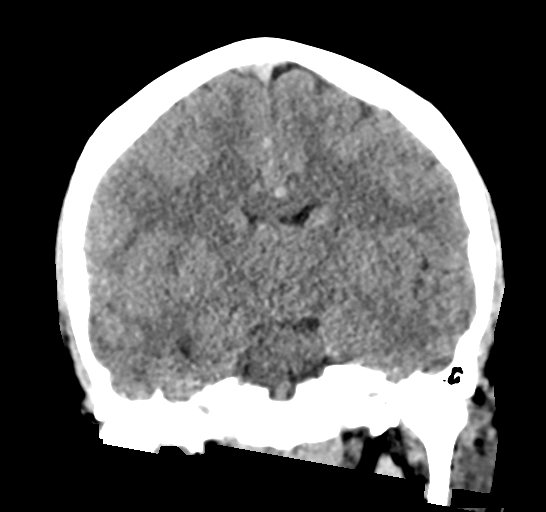
[im 36/65  brain]
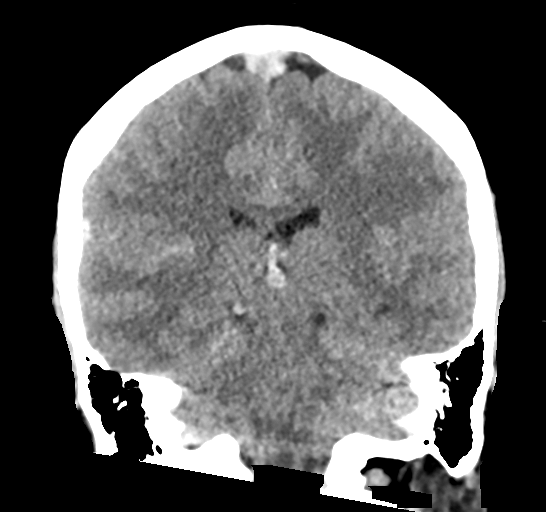

[Series 6: sag soft · sagittal · 0.30mm/px · 3 of 55 slices shown]
[im 22/55  brain]
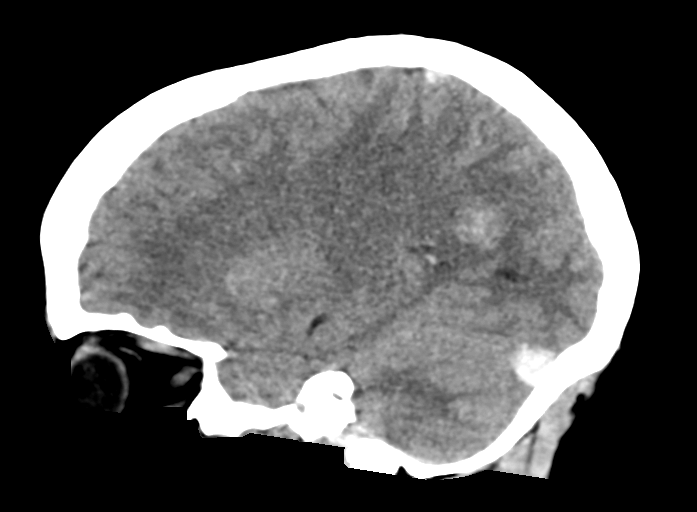
[im 28/55  brain]
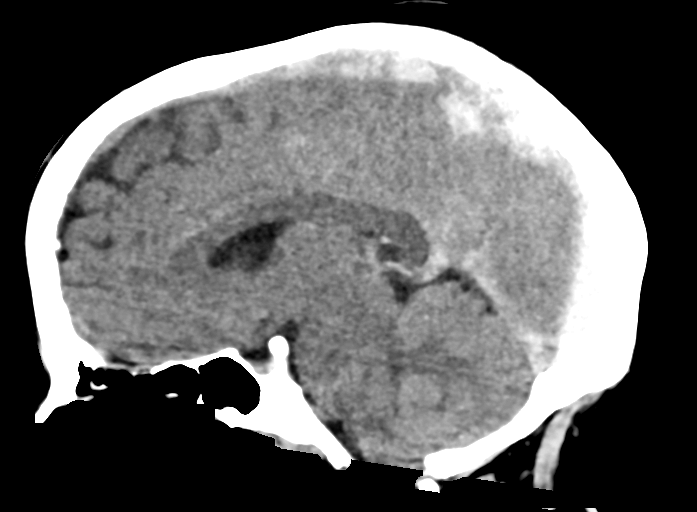
[im 34/55  brain]
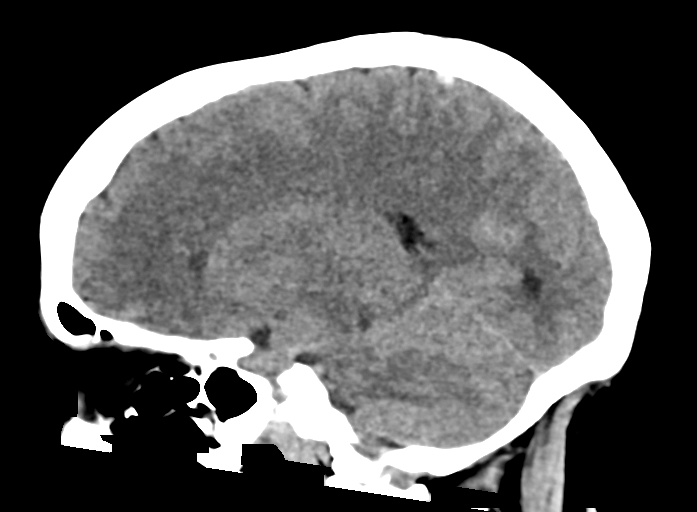

[16 of 47 positions shown; findings below may reference images not displayed]

FINDINGS: Brain: There is a small volume of subarachnoid hemorrhage
bilaterally, most evident in the right frontal parietal region
(axial series 3, image 14). No midline shift. The ventricular system
is unremarkable.There is a small volume of extra-axial hemorrhage in
the basal cisterns. The gray-white differentiation is unremarkable.
The cortical veins appear hyperdense and somewhat enlarged. There is
a questionable thrombus within the right transverse sinus.

Vascular: There is no hyperdense arterial structure.

Skull: The calvarium is unremarkable. The skull base is
unremarkable. The visualized upper cervical spine is unremarkable.

Sinuses/Orbits: The visualized orbits are unremarkable. The
paranasal sinuses are unremarkable. The mastoid air cells are clear.

Other: The visualized parotid gland is unremarkable. There is no
scalp soft tissue swelling.
IMPRESSION: 1. The study is positive for a small volume of subarachnoid
hemorrhage. There is no midline shift.
2. Hyperdense dural sinuses and cortical veins especially on the
right in addition to a filling defect within the transverse sinus on
the right is concerning for dural venous thrombosis and may be the
source of the patient's subarachnoid hemorrhage. Emergent MRI/MRV is
recommended for further evaluation.

These results were called by telephone at the time of interpretation
on [DATE] at [DATE] to provider GHAI , who verbally
acknowledged these results.

## 2020-02-12 MED ORDER — ACETAMINOPHEN 325 MG PO TABS: 650.0000 mg | ORAL_TABLET | ORAL | Status: DC | PRN

## 2020-02-12 MED ORDER — ASPIRIN 325 MG PO TABS: 325.0000 mg | ORAL_TABLET | Freq: Every day | ORAL | Status: DC

## 2020-02-12 MED ORDER — LEVETIRACETAM IN NACL 1000 MG/100ML IV SOLN
1000.0000 mg | Freq: Once | INTRAVENOUS | Status: AC
  Administered 2020-02-12: 1000 mg via INTRAVENOUS
  Filled 2020-02-12: qty 100

## 2020-02-12 MED ORDER — HEPARIN (PORCINE) 25000 UT/250ML-% IV SOLN
1000.0000 [IU]/h | INTRAVENOUS | Status: DC
  Administered 2020-02-12: 850 [IU]/h via INTRAVENOUS
  Filled 2020-02-12 (×3): qty 250

## 2020-02-12 MED ORDER — LORAZEPAM 2 MG/ML IJ SOLN
1.0000 mg | Freq: Once | INTRAMUSCULAR | Status: AC
  Administered 2020-02-12: 1 mg via INTRAVENOUS
  Filled 2020-02-12: qty 1

## 2020-02-12 MED ORDER — GADOBUTROL 1 MMOL/ML IV SOLN
7.0000 mL | Freq: Once | INTRAVENOUS | Status: AC | PRN
  Administered 2020-02-12: 7 mL via INTRAVENOUS

## 2020-02-12 MED ORDER — ASPIRIN 300 MG RE SUPP
300.0000 mg | Freq: Every day | RECTAL | Status: DC
  Administered 2020-02-12 – 2020-02-13 (×2): 300 mg via RECTAL
  Filled 2020-02-12 (×2): qty 1

## 2020-02-12 MED ORDER — INSULIN ASPART 100 UNIT/ML ~~LOC~~ SOLN
0.0000 [IU] | SUBCUTANEOUS | Status: DC
  Administered 2020-02-14 – 2020-02-17 (×6): 2 [IU] via SUBCUTANEOUS
  Administered 2020-02-17: 3 [IU] via SUBCUTANEOUS

## 2020-02-12 MED ORDER — ACETAMINOPHEN 160 MG/5ML PO SOLN: 650.0000 mg | ORAL | Status: DC | PRN

## 2020-02-12 MED ORDER — CHLORHEXIDINE GLUCONATE CLOTH 2 % EX PADS
6.0000 | MEDICATED_PAD | Freq: Every day | CUTANEOUS | Status: DC
  Administered 2020-02-12 – 2020-02-28 (×15): 6 via TOPICAL

## 2020-02-12 MED ORDER — LEVETIRACETAM IN NACL 1000 MG/100ML IV SOLN
1000.0000 mg | Freq: Two times a day (BID) | INTRAVENOUS | Status: DC
  Administered 2020-02-12 – 2020-02-17 (×9): 1000 mg via INTRAVENOUS
  Filled 2020-02-12 (×9): qty 100

## 2020-02-12 MED ORDER — STROKE: EARLY STAGES OF RECOVERY BOOK
Freq: Once | Status: AC
  Filled 2020-02-12: qty 1

## 2020-02-12 MED ORDER — SODIUM CHLORIDE 0.9 % IV SOLN: INTRAVENOUS | Status: DC

## 2020-02-12 MED ORDER — ACETAMINOPHEN 650 MG RE SUPP
650.0000 mg | RECTAL | Status: DC | PRN
  Filled 2020-02-12: qty 1

## 2020-02-12 NOTE — ED Notes (Signed)
Unable to obtain a full NIH due to pt not able to follow any commands or stay awake

## 2020-02-12 NOTE — H&P (Signed)
Neurology Consultation  Reason for Consult: Altered mental status Referring Physician: Dr. Stark Jock  CC: Altered mental status  History is obtained from: chart review, mother at bedside  HPI: Jill Clarke is a 35 y.o. female with a history significant for asthma and sickle cell trait. Jill Clarke presented to the ED 02/11/20 when her mother noticed she had altered mental status at home. According to her mother, Jill Clarke has not been feeling well for approximately one week with headache, nausea, vomiting, and diarrhea. She was at a friends house during these initial symptoms. She called EMS and was taken to the ED for evaluation yesterday (02/10/2020) and was discharged after a successful PO challenge. She then drove to her mothers house and took a nap for about 2 hours. When she woke, her mother states she was not behaving normally. She was rubbing the comforter excessively, turning the light switch on and off repetitively, and was not verbally responsive so she was brought back to the ED. A CT head was obtained revealing a small volume subarachnoid hemorrhage and hyperdense dural sinuses with a filling defect concerning for dural venous thrombosis. 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 Neurology was consulted for further management.   ROS: Unable to obtain due to altered mental status.   Past Medical History:  Diagnosis Date  . Asthma    uses inhaler prn  . Diabetes mellitus without complication ([place])   . Sickle cell trait ([place])   . Unspecified high-risk pregnancy 01/27/2011   Family History  Problem Relation Age of Onset  . Sickle cell trait Mother   . Depression Mother   . Asthma Father   . Drug abuse Father        heroin abuse  . Kidney disease Father        due to heroin abuse  . Diabetes Maternal Grandmother    Social History:   reports that she has quit smoking. She has never used smokeless tobacco. She  reports that she does not drink alcohol and does not use drugs.  Medications No current facility-administered medications on file prior to encounter.   No current outpatient medications on file prior to encounter.   Exam: Current vital signs: BP 127/87   Pulse (!) 124   Temp 99 F (37.2 C) (Axillary)   Resp (!) 24   LMP 02/03/2020 (Approximate)   SpO2 97%  Vital signs in last 24 hours: Temp:  [99 F (37.2 C)-99.2 F (37.3 C)] 99 F (37.2 C) (01/20 1831) Pulse Rate:  [107-133] 124 (01/20 1900) Resp:  [16-33] 24 (01/20 1900) BP: (109-137)/(73-97) 127/87 (01/20 1900) SpO2:  [95 %-100 %] 97 % (01/20 1900)  GENERAL: Somnolent, laying in bed, in no acute distress Head: Normocephalic and atraumatic. EENT: No OP obstruction, normal conjunctiva.  LUNGS - Normal respiratory effort on room air CV - tachycardic to the 130's on cardiac monitor, 2+ pedal pulses ABDOMEN - Soft, non-distended Ext: warm, well perfused  NEURO:  Mental Status: patient is somnolent, does not respond to verbal stimuli. Does not follow commands. Inconsistent, brief eye opening with noxious stimuli, does not track examiner. Inconsistent grimace with noxious stimuli. Unable to assess speech. Does not vocalize.  Cranial Nerves:  II: PERRL 3 mm/brisk. Unable to assess visual fields, does not track examiner.  III, IV, VI: No resistance to passive eye opening, opens eyes briefly to noxious stimuli- mother at bedside states "she is not opening her eyes, she's  asleep with her eyes open" V: Blink to threat absent. VII: face appears symmetric resting and grimacing.  VIII: Unable to assess hearing, patient does not follow commands IX, X: No attempt at phonation, unable to assess palate elevation.  XI, XII: Unable to assess secondary to altered mental status.  Motor: At least 2/5 strength all extremities though unable to perform formal evaluation. Withdrawals all 4 extremities to noxious stimuli.  Tone is increased with  passive movement to bilateral upper extremities. Bulk is normal.  Sensation- Unable to assess sensation to light touch, temperature, 2-point discrimination. Coordination: Unable to assess  DTRs: 2+ throughout.  Gait- deferred  Labs I have reviewed labs in epic and the results pertinent to this consultation are: CBC    Component Value Date/Time   WBC 13.6 (H) 02/12/2020 0127   RBC 4.20 02/12/2020 0127   HGB 12.6 02/12/2020 0127   HCT 37.0 02/12/2020 0127   PLT 578 (H) 02/12/2020 0127   MCV 88.1 02/12/2020 0127   MCH 30.0 02/12/2020 0127   MCHC 34.1 02/12/2020 0127   RDW 13.2 02/12/2020 0127   LYMPHSABS 2.2 11/19/2010 0959   MONOABS 0.6 11/19/2010 0959   EOSABS 0.1 11/19/2010 0959   BASOSABS 0.0 11/19/2010 0959   CMP     Component Value Date/Time   NA 137 02/12/2020 0127   K 3.5 02/12/2020 0127   CL 103 02/12/2020 0127   CO2 20 (L) 02/12/2020 0127   GLUCOSE 168 (H) 02/12/2020 0127   BUN 9 02/12/2020 0127   CREATININE 0.61 02/12/2020 0127   CREATININE 0.44 (L) 01/30/2011 1147   CALCIUM 9.3 02/12/2020 0127   PROT 8.3 (H) 02/12/2020 0127   ALBUMIN 3.6 02/12/2020 0127   AST 16 02/12/2020 0127   ALT 30 02/12/2020 0127   ALKPHOS 52 02/12/2020 0127   BILITOT 1.0 02/12/2020 0127   GFRNONAA >60 02/12/2020 0127   GFRAA 60 (L) 12/01/2016 1806   Imaging I have reviewed the images obtained:  CT-scan of the brain IMPRESSION: 1. The study is positive for a small volume of subarachnoid hemorrhage. There is no midline shift. 2. Hyperdense dural sinuses and cortical veins especially on the right in addition to a filling defect within the transverse sinus on the right is concerning for dural venous thrombosis and may be the source of the patient's subarachnoid hemorrhage. Emergent MRI/MRV is recommended for further evaluation.  MRI/MR venogram examination of the brain IMPRESSION: 1. Extensive, acute dural venous sinus thrombosis involving the superior sagittal sinus into the right  internal jugular vein via the dominant right transverse and sigmoid system. Both veins of [place] are thrombosed and there is straight sinus/deep cerebral thrombosis as well. 2. Deep watershed white matter infarcts, small volume subarachnoid hemorrhage, and left more than right frontoparietal cortical swelling due to #1.  Assessment:  Jill Clarke is a 35 year old female who presented to the ED with AMS following one week of GI symptoms. Imaging obtained in the ED revealed a small volume SAH and an extensive, acute dural venous sinus thrombosis; etiology possible thrombus secondary to dehydration from recent GI illness. Young female without significant identifiable risk factors for thrombosis, need to rule out hypercoagulable condition as etiology of thrombosis. Sickle cell trait without sickle cell disease. Subarachnoid hemorrhage likely sequelae from extensive dural venous thrombosis.   Recommendations: - HgbA1c, fasting lipid panel - Recommend ICU admission with frequent neuro checks- patient at risk for neuro decline due to further thrombosis, hemorrhage, and cerebral edema - Hypercoagulable work-up: Protein C, Protein  S, homocysteine, activated protein C resistance (factor V Leiden), prothrombin G20210A, antithrombin, antiphospholipid antibodies  - Prophylactic therapy: Heparin gtt per stroke protocol due to acute dural venous thrombosis - Risk factor modification - Telemetry monitoring - PT consult, OT consult, Speech consult - Stroke team to follow   Pt seen by NP/Neuro and later by MD. Note/plan to be edited by MD as needed.  Anibal Henderson, AGAC-NP [facility] Pager: [phone]   I have seen the patient and reviewed the above note.  She reported significant GI symptoms for about a week prior to admission.  Though it is possible that her nausea and vomiting could be related to increased ICP, the diarrhea would not be expected and therefore I think it is more likely that  she became dehydrated due to a gastrointestinal illness with subsequent cerebral venous sinus thrombosis due to dehydration.  She does not have any clear risk factors and therefore hypercoagulability needs to be assessed..  The etiology of her hemorrhage is likely rupture of a small surface cortical vein due to congestion.  Though there is some risk of bleeding with anticoagulation it is generally excepted practice that in the setting of dural venous sinus thrombosis, despite evidence of hemorrhage, anticoagulation is necessary and indicated.  I discussed the risks and benefits of anticoagulation with the mother who agreed with proceeding with therapy.  Given the high risk of anticoagulation as well as not anticoagulation, she needs very close monitoring in ICU setting and has a high risk of decompensation over the next 24 to 48 hours.  We are therefore admitting her to the intensive care unit for close monitoring.   This patient is critically ill and at significant risk of neurological worsening, death and care requires constant monitoring of vital signs, hemodynamics,respiratory and cardiac monitoring, neurological assessment, discussion with family, other specialists and medical decision making of high complexity. I spent 45 minutes of neurocritical care time  in the care of  this patient. This was time spent independent of any time provided by nurse practitioner or PA.  Roland Rack, MD [facility] [phone]  If 7pm- 7am, please page neurology on call as listed in [place]. 02/12/2020  7:44 PM

## 2020-02-12 NOTE — Progress Notes (Signed)
PT Cancellation Note  Patient Details Name: Jill Clarke MRN: [id number] DOB: September 16, 1985   Cancelled Treatment:    Reason Eval/Treat Not Completed: Medical issues which prohibited therapy Pt in ED and being transferred to ICU when bed available. No appropriate for PT at this time. Will follow up as schedule allows.   Lou Miner, DPT  Acute Rehabilitation Services  Pager: [phone] Office: [phone]    Rudean Hitt 02/12/2020, 11:25 AM

## 2020-02-12 NOTE — ED Notes (Signed)
EEG at bedside.

## 2020-02-12 NOTE — Procedures (Addendum)
Patient Name: Jill Clarke  MRN: [id number]  Epilepsy Attending: Lora Havens  Referring Physician/Provider: Dr. Kathrynn Speed Date: 02/12/2020 Duration: 25.25 minutes  Patient history: 35 year old female with altered mental status.  EEG to evaluate for seizures.  Level of alertness: Awake, asleep  AEDs during EEG study: None  Technical aspects: This EEG study was done with scalp electrodes positioned according to the 10-20 International system of electrode placement. Electrical activity was acquired at a sampling rate of 500Hz  and reviewed with a high frequency filter of 70Hz  and a low frequency filter of 1Hz . EEG data were recorded continuously and digitally stored.   Description: Np posterior dominant rhythm was seen. Sleep was characterized by sleep spindles (12 to 14 Hz), maximal frontocentral region.  EEG showed continuous generalized 3 to 5 Hz theta and delta slowing.  Hyperventilation and photic stimulation were not performed.   At 1531, patient was noted to move her right arm towards her chest followed by slow but rhythmic jerking of right upper extremity.  Concomitant EEG showed high amplitude 2 to 3 Hz rhythmic delta slowing in bitemporal region which gradually increases in amplitude and appears more sharply contoured without definite evolution in frequency.   ABNORMALITY -Continue slow, generalized  IMPRESSION: This study is suggestive of moderate diffuse encephalopathy, nonspecific etiology.  At 1531, patient was noted to have right upper extremity jerking.  Concomitant EEG showed high amplitude 2 to 3 Hz rhythmic delta slowing in bitemporal region with evolution in morphology and amplitude but without definite evolution in frequency concerning for focal motor seizure.  Dr. Leonel Ramsay was notified.  Jill Clarke

## 2020-02-12 NOTE — ED Notes (Signed)
Pt being transported to MRI now

## 2020-02-12 NOTE — ED Triage Notes (Signed)
Patient presents with mother from home, was discharged around noon on 1/20, her mom took her to her car, she drove back to her mom's house and laid down for a nap, her mom reports she woke up and started acting strangely, flicking the lights on and off, very fidgety but was not talking or responding to her mom. Patient unable to speak at this time, able to open eyes but not following commands.

## 2020-02-12 NOTE — Progress Notes (Signed)
EEG completed, results pending. 

## 2020-02-12 NOTE — Progress Notes (Signed)
This RN noticed that an EVS personnel had walked into patient's room without notice. I went in there to ask why they were in there and he said "this is my sister". I explained very clearly to the [place] personnel and the patients designated ONLY visitor (the mother), that per [facility], that we cannot allow random visitors to come in to this unit due to the nature of this being a "locked unit". I explained that we have patient's who are victims of violent crimes, etc. And we have to be very strict as to who comes in the unit - even if they work in the hospital and can badge themselves in. Both EVS personnel and patient's mother verbalized understanding. EMS personnel has now left. Charge RN [place] notified at this time.

## 2020-02-12 NOTE — ED Notes (Signed)
Pt noted to have dried blood on her lips, this RN inspected mouth a noted oozing from gums, the pharmacist and Dr Leonel Ramsay made aware. Dr Leonel Ramsay recommends continuing the heparin and closely monitor.

## 2020-02-12 NOTE — ED Provider Notes (Signed)
[facility] EMERGENCY DEPARTMENT Provider Note   CSN: [id number] Arrival date & time: 02/11/20  2319     History Chief Complaint  Patient presents with  . Altered Mental Status    Jill Clarke is a 35 y.o. female.  Patient is a 35 year old female with history of asthma, sickle cell trait, and prior pregnancy.  Patient presents today for evaluation of altered mental status.  According to the patient's mother, she was seen here yesterday for vomiting and abdominal discomfort.  She was given IV fluids as well as Zofran and potassium, then discharged to home after a successful p.o. challenge.  Shortly after returning home, her mother noticed that she began to behave strangely.  She states that she was picking at her blankets and rubbing her hands on the doorknob and asking what these objects were.  She was also playing with a light switch and flicking the lights on and off.  This confusion progressed to her being less responsive.  She is now not responding to questions and is lying motionless in her exam stretcher.  Her mother states that she has never witnessed any of these behaviors before.  Mom denies that she has any history of drug or alcohol use.  She also has no history of mental illness.  The history is provided by the patient and a relative (Patient's mother).  Altered Mental Status Presenting symptoms: behavior changes, confusion and disorientation   Severity:  Moderate Most recent episode:  Today Episode history:  Single Timing:  Constant Progression:  Worsening Chronicity:  New      Past Medical History:  Diagnosis Date  . Asthma    uses inhaler prn  . Diabetes mellitus without complication ([place])   . Sickle cell trait ([place])   . Unspecified high-risk pregnancy 01/27/2011    Patient Active Problem List   Diagnosis Date Noted  . Unspecified contraceptive management 10/02/2011  . Unspecified high-risk pregnancy 01/27/2011  . Low birth weight  01/23/2011  . Asthma 01/23/2011  . Short interval between pregnancies complicating pregnancy, antepartum 01/23/2011  . Nausea and vomiting in pregnancy 12/28/2010    Past Surgical History:  Procedure Laterality Date  . TONSILLECTOMY    . WISDOM TOOTH EXTRACTION       OB History    Gravida  2   Para  2   Term  2   Preterm  0   AB  0   Living  2     SAB  0   IAB  0   Ectopic  0   Multiple  0   Live Births  2           Family History  Problem Relation Age of Onset  . Sickle cell trait Mother   . Depression Mother   . Asthma Father   . Drug abuse Father        heroin abuse  . Kidney disease Father        due to heroin abuse  . Diabetes Maternal Grandmother     Social History   Tobacco Use  . Smoking status: Former Research scientist (life sciences)  . Smokeless tobacco: Never Used  Substance Use Topics  . Alcohol use: No  . Drug use: No    Home Medications Prior to Admission medications   Medication Sig Start Date End Date Taking? Authorizing Provider  albuterol (PROVENTIL HFA;VENTOLIN HFA) 108 (90 BASE) MCG/ACT inhaler Inhale 1-2 puffs into the lungs every 6 (six) hours as needed for  wheezing or shortness of breath. Patient not taking: Reported on 12/01/2016 06/03/12   Rosana Hoes, MD  albuterol (PROVENTIL HFA;VENTOLIN HFA) 108 (90 BASE) MCG/ACT inhaler Inhale 2 puffs into the lungs every 2 (two) hours as needed for wheezing or shortness of breath (cough). Patient not taking: Reported on 12/01/2016 01/19/14   Street, [place], PA-C  amoxicillin-clavulanate (AUGMENTIN) 875-125 MG per tablet Take 1 tablet by mouth every 12 (twelve) hours. Patient not taking: Reported on 12/01/2016 02/11/13   Janne Napoleon, NP  cyclobenzaprine (FLEXERIL) 10 MG tablet Take 1 tablet (10 mg total) by mouth 2 (two) times daily as needed for muscle spasms. Patient not taking: Reported on 12/01/2016 07/04/15   Dowless, Aldona Bar Tripp, PA-C  guaiFENesin (MUCINEX) 600 MG 12 hr tablet Take 1 tablet (600 mg  total) by mouth 2 (two) times daily as needed for cough or to loosen phlegm. Patient not taking: Reported on 12/01/2016 01/19/14   Street, [place], PA-C  ketoconazole (NIZORAL) 2 % cream Apply 1 application topically daily. Patient not taking: Reported on 12/01/2016 12/15/12   Cyndee Brightly, CNM  loratadine (CLARITIN) 10 MG tablet Take 1 tablet (10 mg total) by mouth daily. One po daily x 5 days Patient not taking: Reported on 12/01/2016 01/19/14   Street, [place], PA-C  naproxen (NAPROSYN) 500 MG tablet Take 1 tablet (500 mg total) by mouth 2 (two) times daily. Patient not taking: Reported on 12/01/2016 07/04/15   Dowless, Aldona Bar Tripp, PA-C  ondansetron (ZOFRAN ODT) 4 MG disintegrating tablet Take 1 tablet (4 mg total) by mouth every 8 (eight) hours as needed for nausea. 02/11/20   Larene Pickett, PA-C  ondansetron (ZOFRAN) 4 MG tablet Take 1 tablet (4 mg total) every 6 (six) hours by mouth. 12/01/16   Caccavale, Sophia, PA-C  permethrin (ELIMITE) 5 % cream Apply to affected area once, leave for 8 hours then shower Patient not taking: Reported on 12/01/2016 05/19/14   Presson, Audelia Hives, PA  polyethylene glycol powder (GLYCOLAX/MIRALAX) powder Take 1-2 capfuls daily until you have normal bowel movements 12/01/16   Caccavale, Sophia, PA-C  predniSONE (DELTASONE) 10 MG tablet 6 day step down dose Patient not taking: Reported on 12/01/2016 05/22/14   Glendell Docker, NP  terbinafine (LAMISIL AT) 1 % cream Apply 1 application topically 2 (two) times daily. Patient not taking: Reported on 12/01/2016 06/03/14   Larene Pickett, PA-C  terconazole (TERAZOL 7) 0.4 % vaginal cream Place 1 applicator vaginally at bedtime. Patient not taking: Reported on 12/01/2016 12/15/12   Cyndee Brightly, CNM    Allergies    Patient has no known allergies.  Review of Systems   Review of Systems  Psychiatric/Behavioral: Positive for confusion.  All other systems reviewed and are  negative.   Physical Exam Updated Vital Signs BP 115/88   Pulse (!) 113   Temp 99.2 F (37.3 C) (Rectal)   Resp (!) 22   LMP 02/03/2020 (Approximate)   SpO2 99%   Physical Exam Vitals and nursing note reviewed.  Constitutional:      General: She is not in acute distress.    Appearance: She is well-developed and well-nourished. She is not diaphoretic.  HENT:     Head: Normocephalic and atraumatic.  Cardiovascular:     Rate and Rhythm: Normal rate and regular rhythm.     Heart sounds: No murmur heard. No friction rub. No gallop.   Pulmonary:     Effort: Pulmonary effort is normal. No respiratory distress.  Breath sounds: Normal breath sounds. No wheezing.  Abdominal:     General: Bowel sounds are normal. There is no distension.     Palpations: Abdomen is soft.     Tenderness: There is no abdominal tenderness.  Musculoskeletal:        General: Normal range of motion.     Cervical back: Normal range of motion and neck supple.  Skin:    General: Skin is warm and dry.  Neurological:     Mental Status: She is alert.     Comments: Patient is lying still in the exam stretcher and seems disoriented/somnolent.  I have witnessed her move all 4 extremities.  She will open eyes to voice, but is not following commands or answering questions.  Remainder of neurologic exam is limited secondary to patient's mental status.     ED Results / Procedures / Treatments   Labs (all labs ordered are listed, but only abnormal results are displayed) Labs Reviewed  COMPREHENSIVE METABOLIC PANEL - Abnormal; Notable for the following components:      Result Value   CO2 20 (*)    Glucose, Bld 168 (*)    Total Protein 8.3 (*)    All other components within normal limits  CBC - Abnormal; Notable for the following components:   WBC 13.6 (*)    Platelets 578 (*)    All other components within normal limits  CBG MONITORING, ED - Abnormal; Notable for the following components:    Glucose-Capillary 161 (*)    All other components within normal limits  URINALYSIS, ROUTINE W REFLEX MICROSCOPIC  RAPID URINE DRUG SCREEN, HOSP PERFORMED  ETHANOL  SALICYLATE LEVEL  ACETAMINOPHEN LEVEL  I-STAT BETA HCG BLOOD, ED ([facility], [facility], [facility] ONLY)    EKG None  Radiology No results found.  Procedures Procedures (including critical care time)  Medications Ordered in ED Medications - No data to display  ED Course  I have reviewed the triage vital signs and the nursing notes.  Pertinent labs & imaging results that were available during my care of the patient were reviewed by me and considered in my medical decision making (see chart for details).    MDM Rules/Calculators/A&P  Patient is a 35 year old female brought by her mother for evaluation of altered mental status and behavioral changes as described in the HPI.  She arrives here with stable vital signs.  Laboratory studies obtained are essentially unremarkable.  Patient sent for a head CT which shows small volume subarachnoid hemorrhage and suspicion for dural venous sinus thrombosis.  Radiology has recommended an MRI/MRV.  The studies have been obtained and unfortunately reveal extensive, acute dural venous sinus thrombosis involving the superior sagittal sinus and to the right internal jugular vein.  She also has deep watershed white matter infarcts, small volume subarachnoid hemorrhage and left greater than right frontoparietal cortical swelling secondary to the venous sinus thrombosis.  This finding was discussed with Dr. Leonel Ramsay from neurology.  He will come to evaluate the patient and determine the proper management.  It sounds as though she will require heparinization.  CRITICAL CARE Performed by: Veryl Speak Total critical care time: 45 minutes Critical care time was exclusive of separately billable procedures and treating other patients. Critical care was necessary to treat or prevent imminent or  life-threatening deterioration. Critical care was time spent personally by me on the following activities: development of treatment plan with patient and/or surrogate as well as nursing, discussions with consultants, evaluation of patient's response to treatment,  examination of patient, obtaining history from patient or surrogate, ordering and performing treatments and interventions, ordering and review of laboratory studies, ordering and review of radiographic studies, pulse oximetry and re-evaluation of patient's condition.   Final Clinical Impression(s) / ED Diagnoses Final diagnoses:  None    Rx / DC Orders ED Discharge Orders    None       Veryl Speak, MD 02/15/20 [phone]

## 2020-02-12 NOTE — Progress Notes (Signed)
[place] for heparin Indication: Dural venous sinus thrombosis  No Known Allergies  Patient Measurements:   Heparin Dosing Weight: 68 kg  Vital Signs: BP: 123/87 (01/20 1745) Pulse Rate: 122 (01/20 1745)  Labs: Recent Labs    02/10/20 2110 02/12/20 0127 02/12/20 0417 02/12/20 1600  HGB 13.6 12.6  --   --   HCT 41.2 37.0  --   --   PLT 634* 578*  --   --   LABPROT  --   --  14.7  --   INR  --   --  1.2  --   HEPARINUNFRC  --   --   --  <0.10*  CREATININE 0.82 0.61  --   --     Estimated Creatinine Clearance: 88.3 mL/min (by C-G formula based on SCr of 0.61 mg/dL).   Medical History: Past Medical History:  Diagnosis Date  . Asthma    uses inhaler prn  . Diabetes mellitus without complication ([place])   . Sickle cell trait ([place])   . Unspecified high-risk pregnancy 01/27/2011    Medications:  No medications prior to admission.    Assessment: Jill Clarke with altered mental status found to have a small volume subarachnoid hemorrhage and hyperdense dural sinuses concerning for dural venous thrombosis. Pharmacy consulted to start IV heparin per stroke protocol.   Heparin briefly held (~10 minutes) today while evaluating bleeding from mouth; however, Neurology ok'd to resume heparin. Level resulted undetectable tonight. H/H wnl. Plt elevated. SCr wnl. No further mouth bleeding or issues with infusion per discussion with RN.  Goal of Therapy:  Heparin level 0.3-0.5 units/ml Monitor platelets by anticoagulation protocol: Yes   Plan:  No bolus. Increase heparin to 1000 units/hr Check 6hr heparin level Monitor daily CBC, s/sx bleeding   Arturo Morton, PharmD, BCPS Please check AMION for all [facility] contact numbers Clinical Pharmacist 02/12/2020 6:34 PM

## 2020-02-12 NOTE — Consult Note (Deleted)
Neurology Consultation  Reason for Consult: Altered mental status Referring Physician: Dr. Stark Jock  CC: Altered mental status  History is obtained from: chart review, mother at bedside  HPI: Jill Clarke is a 35 y.o. female with a history significant for asthma and sickle cell trait. Jill Clarke presented to the ED 02/11/20 when her mother noticed she had altered mental status at home. According to her mother, Jill Clarke has not been feeling well for approximately one week with headache, nausea, vomiting, and diarrhea. She was at a friends house during these initial symptoms. She called EMS and was taken to the ED for evaluation yesterday (02/10/2020) and was discharged after a successful PO challenge. She then drove to her mothers house and took a nap for about 2 hours. When she woke, her mother states she was not behaving normally. She was rubbing the comforter excessively, turning the light switch on and off repetitively, and was not verbally responsive so she was brought back to the ED. A CT head was obtained revealing a small volume subarachnoid hemorrhage and hyperdense dural sinuses with a filling defect concerning for dural venous thrombosis. 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 Neurology was consulted for further management.   ROS: Unable to obtain due to altered mental status.   Past Medical History:  Diagnosis Date  . Asthma    uses inhaler prn  . Diabetes mellitus without complication ([place])   . Sickle cell trait ([place])   . Unspecified high-risk pregnancy 01/27/2011   Family History  Problem Relation Age of Onset  . Sickle cell trait Mother   . Depression Mother   . Asthma Father   . Drug abuse Father        heroin abuse  . Kidney disease Father        due to heroin abuse  . Diabetes Maternal Grandmother    Social History:   reports that she has quit smoking. She has never used smokeless tobacco. She  reports that she does not drink alcohol and does not use drugs.  Medications No current facility-administered medications on file prior to encounter.   No current outpatient medications on file prior to encounter.   Exam: Current vital signs: BP 128/85   Pulse (!) 133   Temp 99.2 F (37.3 C) (Rectal)   Resp (!) 22   LMP 02/03/2020 (Approximate)   SpO2 97%  Vital signs in last 24 hours: Temp:  [98.5 F (36.9 C)-99.2 F (37.3 C)] 99.2 F (37.3 C) (01/20 0331) Pulse Rate:  [109-133] 133 (01/20 0715) Resp:  [15-32] 22 (01/20 0715) BP: (112-130)/(82-93) 128/85 (01/20 0715) SpO2:  [96 %-100 %] 97 % (01/20 0715)  GENERAL: Somnolent, laying in bed, in no acute distress Head: Normocephalic and atraumatic. EENT: No OP obstruction, normal conjunctiva.  LUNGS - Normal respiratory effort on room air CV - tachycardic to the 130's on cardiac monitor, 2+ pedal pulses ABDOMEN - Soft, non-distended Ext: warm, well perfused  NEURO:  Mental Status: patient is somnolent, does not respond to verbal stimuli. Does not follow commands. Inconsistent, brief eye opening with noxious stimuli, does not track examiner. Inconsistent grimace with noxious stimuli. Unable to assess speech. Does not vocalize.  Cranial Nerves:  II: PERRL 3 mm/brisk. Unable to assess visual fields, does not track examiner.  III, IV, VI: No resistance to passive eye opening, opens eyes briefly to noxious stimuli- mother at bedside states "she is not opening her eyes, she's  asleep with her eyes open" V: Blink to threat absent. VII: face appears symmetric resting and grimacing.  VIII: Unable to assess hearing, patient does not follow commands IX, X: No attempt at phonation, unable to assess palate elevation.  XI, XII: Unable to assess secondary to altered mental status.  Motor: At least 2/5 strength all extremities though unable to perform formal evaluation. Withdrawals all 4 extremities to noxious stimuli.  Tone is increased  with passive movement to bilateral upper extremities. Bulk is normal.  Sensation- Unable to assess sensation to light touch, temperature, 2-point discrimination. Coordination: Unable to assess  DTRs: 2+ throughout.  Gait- deferred  Labs I have reviewed labs in epic and the results pertinent to this consultation are: CBC    Component Value Date/Time   WBC 13.6 (H) 02/12/2020 0127   RBC 4.20 02/12/2020 0127   HGB 12.6 02/12/2020 0127   HCT 37.0 02/12/2020 0127   PLT 578 (H) 02/12/2020 0127   MCV 88.1 02/12/2020 0127   MCH 30.0 02/12/2020 0127   MCHC 34.1 02/12/2020 0127   RDW 13.2 02/12/2020 0127   LYMPHSABS 2.2 11/19/2010 0959   MONOABS 0.6 11/19/2010 0959   EOSABS 0.1 11/19/2010 0959   BASOSABS 0.0 11/19/2010 0959   CMP     Component Value Date/Time   NA 137 02/12/2020 0127   K 3.5 02/12/2020 0127   CL 103 02/12/2020 0127   CO2 20 (L) 02/12/2020 0127   GLUCOSE 168 (H) 02/12/2020 0127   BUN 9 02/12/2020 0127   CREATININE 0.61 02/12/2020 0127   CREATININE 0.44 (L) 01/30/2011 1147   CALCIUM 9.3 02/12/2020 0127   PROT 8.3 (H) 02/12/2020 0127   ALBUMIN 3.6 02/12/2020 0127   AST 16 02/12/2020 0127   ALT 30 02/12/2020 0127   ALKPHOS 52 02/12/2020 0127   BILITOT 1.0 02/12/2020 0127   GFRNONAA >60 02/12/2020 0127   GFRAA 60 (L) 12/01/2016 1806   Imaging I have reviewed the images obtained:  CT-scan of the brain IMPRESSION: 1. The study is positive for a small volume of subarachnoid hemorrhage. There is no midline shift. 2. Hyperdense dural sinuses and cortical veins especially on the right in addition to a filling defect within the transverse sinus on the right is concerning for dural venous thrombosis and may be the source of the patient's subarachnoid hemorrhage. Emergent MRI/MRV is recommended for further evaluation.  MRI/MR venogram examination of the brain IMPRESSION: 1. Extensive, acute dural venous sinus thrombosis involving the superior sagittal sinus into the  right internal jugular vein via the dominant right transverse and sigmoid system. Both veins of Trolard are thrombosed and there is straight sinus/deep cerebral thrombosis as well. 2. Deep watershed white matter infarcts, small volume subarachnoid hemorrhage, and left more than right frontoparietal cortical swelling due to #1.  Assessment:  Jill Clarke is a 35 year old female who presented to the ED with AMS following one week of GI symptoms. Imaging obtained in the ED revealed a small volume SAH and an extensive, acute dural venous sinus thrombosis; etiology possible thrombus secondary to dehydration from recent GI illness. Young female without significant identifiable risk factors for thrombosis, need to rule out hypercoagulable condition as etiology of thrombosis. Sickle cell trait without sickle cell disease. Subarachnoid hemorrhage likely sequelae from extensive dural venous thrombosis.   Recommendations: - HgbA1c, fasting lipid panel - Recommend ICU admission with frequent neuro checks- patient at risk for neuro decline due to further thrombosis, hemorrhage, and cerebral edema - Hypercoagulable work-up: Protein C, Protein  S, homocysteine, activated protein C resistance (factor V Leiden), prothrombin G20210A, antithrombin, antiphospholipid antibodies  - Prophylactic therapy: Heparin gtt per stroke protocol due to acute dural venous thrombosis - Risk factor modification - Telemetry monitoring - PT consult, OT consult, Speech consult - Stroke team to follow   Pt seen by NP/Neuro and later by MD. Note/plan to be edited by MD as needed.  Anibal Henderson, AGAC-NP [facility] Pager: [phone]   I have seen the patient and reviewed the above note.  She reported significant GI symptoms for about a week prior to admission.  Though it is possible that her nausea and vomiting could be related to increased ICP, the diarrhea would not be expected and therefore I think it is more likely  that she became dehydrated due to a gastrointestinal illness with subsequent cerebral venous sinus thrombosis due to dehydration.  She does not have any clear risk factors and therefore hypercoagulability needs to be assessed..  The etiology of her hemorrhage is likely rupture of a small surface cortical vein due to congestion.  Though there is some risk of bleeding with anticoagulation it is generally excepted practice that in the setting of dural venous sinus thrombosis, despite evidence of hemorrhage, anticoagulation is necessary and indicated.  I discussed the risks and benefits of anticoagulation with the mother who agreed with proceeding with therapy.  Given the high risk of anticoagulation as well as not anticoagulation, she needs very close monitoring in ICU setting and has a high risk of decompensation over the next 24 to 48 hours.  We are therefore admitting her to the intensive care unit for close monitoring.   This patient is critically ill and at significant risk of neurological worsening, death and care requires constant monitoring of vital signs, hemodynamics,respiratory and cardiac monitoring, neurological assessment, discussion with family, other specialists and medical decision making of high complexity. I spent 45 minutes of neurocritical care time  in the care of  this patient. This was time spent independent of any time provided by nurse practitioner or PA.  Roland Rack, MD [facility] [phone]  If 7pm- 7am, please page neurology on call as listed in [place]. 02/12/2020  10:14 AM

## 2020-02-12 NOTE — Progress Notes (Signed)
ANTICOAGULATION CONSULT NOTE - Initial Consult  Pharmacy Consult for heparin Indication: Dural venous sinus thrombosis  No Known Allergies  Patient Measurements:   Heparin Dosing Weight: 68 kg  Vital Signs: Temp: 99.2 F (37.3 C) (01/20 0331) Temp Source: Rectal (01/20 0331) BP: 123/87 (01/20 0800) Pulse Rate: 124 (01/20 0800)  Labs: Recent Labs    02/10/20 2110 02/12/20 0127 02/12/20 0417  HGB 13.6 12.6  --   HCT 41.2 37.0  --   PLT 634* 578*  --   LABPROT  --   --  14.7  INR  --   --  1.2  CREATININE 0.82 0.61  --     Estimated Creatinine Clearance: 88.3 mL/min (by C-G formula based on SCr of 0.61 mg/dL).   Medical History: Past Medical History:  Diagnosis Date  . Asthma    uses inhaler prn  . Diabetes mellitus without complication ([place])   . Sickle cell trait ([place])   . Unspecified high-risk pregnancy 01/27/2011    Medications:  (Not in a hospital admission)   Assessment: 19 YOF with altered mental status found to have a small volume subarachnoid hemorrhage and hyperdense dural sinuses concerning for dural venous thrombosis. Pharmacy consulted to start IV heparin per stroke protocol.   H/H wnl. Plt elevated. SCr wnl  Goal of Therapy:  Heparin level 0.3-0.5 units/ml Monitor platelets by anticoagulation protocol: Yes   Plan:  -Start IV heparin at 850 units/hr -F/u 6 hr HL -Monitor daily heparin level, CBC and s/s of bleeding  Albertina Parr, PharmD., BCPS, BCCCP Clinical Pharmacist Please refer to [facility] for unit-specific pharmacist

## 2020-02-13 ENCOUNTER — Encounter (HOSPITAL_COMMUNITY)
Admission: EM | Disposition: A | Discharge status: Rehabilitation Facility | Payer: Self-pay | Source: Home / Self Care | Attending: Neurology

## 2020-02-13 ENCOUNTER — Inpatient Hospital Stay (HOSPITAL_COMMUNITY): Payer: 59

## 2020-02-13 ENCOUNTER — Encounter (HOSPITAL_COMMUNITY): Payer: Self-pay | Admitting: Neurology

## 2020-02-13 ENCOUNTER — Inpatient Hospital Stay (HOSPITAL_COMMUNITY): Payer: 59 | Admitting: Certified Registered Nurse Anesthetist

## 2020-02-13 ENCOUNTER — Inpatient Hospital Stay: Payer: Self-pay

## 2020-02-13 DIAGNOSIS — I6330 Cerebral infarction due to thrombosis of unspecified cerebral artery: Secondary | ICD-10-CM | POA: Insufficient documentation

## 2020-02-13 DIAGNOSIS — G08 Intracranial and intraspinal phlebitis and thrombophlebitis: Secondary | ICD-10-CM

## 2020-02-13 DIAGNOSIS — J9690 Respiratory failure, unspecified, unspecified whether with hypoxia or hypercapnia: Secondary | ICD-10-CM

## 2020-02-13 DIAGNOSIS — I6602 Occlusion and stenosis of left middle cerebral artery: Secondary | ICD-10-CM | POA: Diagnosis present

## 2020-02-13 DIAGNOSIS — G9340 Encephalopathy, unspecified: Secondary | ICD-10-CM

## 2020-02-13 HISTORY — PX: IR THROMBECT VENO MECH MOD SED: IMG2300

## 2020-02-13 HISTORY — PX: IR PTA VENOUS EXCEPT DIALYSIS CIRCUIT: IMG6126

## 2020-02-13 HISTORY — PX: IR ANGIO INTRA EXTRACRAN SEL INTERNAL CAROTID UNI L MOD SED: IMG5361

## 2020-02-13 HISTORY — PX: IR ANGIO INTRA EXTRACRAN SEL COM CAROTID INNOMINATE BILAT MOD SED: IMG5360

## 2020-02-13 HISTORY — PX: IR ANGIO VERTEBRAL SEL VERTEBRAL UNI R MOD SED: IMG5368

## 2020-02-13 HISTORY — PX: RADIOLOGY WITH ANESTHESIA: SHX6223

## 2020-02-13 HISTORY — PX: IR ANGIO VERTEBRAL SEL VERTEBRAL BILAT MOD SED: IMG5369

## 2020-02-13 HISTORY — PX: IR CT HEAD LTD: IMG2386

## 2020-02-13 HISTORY — PX: IR ANGIO INTRA EXTRACRAN SEL COM CAROTID INNOMINATE UNI R MOD SED: IMG5359

## 2020-02-13 LAB — LUPUS ANTICOAGULANT PANEL
DRVVT: 32.8 s (ref 0.0–47.0)
PTT Lupus Anticoagulant: 26.7 s (ref 0.0–51.9)

## 2020-02-13 LAB — POCT I-STAT, CHEM 8
BUN: 3 mg/dL — ABNORMAL LOW (ref 6–20)
Calcium, Ion: 1.16 mmol/L (ref 1.15–1.40)
Chloride: 118 mmol/L — ABNORMAL HIGH (ref 98–111)
Creatinine, Ser: <0.2 mg/dL — ABNORMAL LOW (ref 0.44–1.00)
Glucose, Bld: 94 mg/dL (ref 70–99)
HCT: 22 % — ABNORMAL LOW (ref 36.0–46.0)
Hemoglobin: 7.5 g/dL — ABNORMAL LOW (ref 12.0–15.0)
Potassium: 2.8 mmol/L — ABNORMAL LOW (ref 3.5–5.1)
Sodium: 154 mmol/L — ABNORMAL HIGH (ref 135–145)
TCO2: 22 mmol/L (ref 22–32)

## 2020-02-13 LAB — HOMOCYSTEINE: Homocysteine: 3.5 umol/L (ref 0.0–14.5)

## 2020-02-13 LAB — LIPID PANEL
Cholesterol: 148 mg/dL (ref 0–200)
HDL: 28 mg/dL — ABNORMAL LOW (ref >40)
LDL Cholesterol: 108 mg/dL — ABNORMAL HIGH (ref 0–99)
Total CHOL/HDL Ratio: 5.3 RATIO
Triglycerides: 60 mg/dL (ref <150)
VLDL: 12 mg/dL (ref 0–40)

## 2020-02-13 LAB — HEPARIN LEVEL (UNFRACTIONATED)
Heparin Unfractionated: 0.48 IU/mL (ref 0.30–0.70)
Heparin Unfractionated: 0.36 IU/mL (ref 0.30–0.70)

## 2020-02-13 LAB — HEMOGLOBIN A1C
Hgb A1c MFr Bld: 4.9 % (ref 4.8–5.6)
Mean Plasma Glucose: 93.93 mg/dL

## 2020-02-13 LAB — PROTEIN S ACTIVITY: Protein S Activity: 48 % — ABNORMAL LOW (ref 63–140)

## 2020-02-13 LAB — GLUCOSE, CAPILLARY
Glucose-Capillary: 111 mg/dL — ABNORMAL HIGH (ref 70–99)
Glucose-Capillary: 100 mg/dL — ABNORMAL HIGH (ref 70–99)
Glucose-Capillary: 91 mg/dL (ref 70–99)
Glucose-Capillary: 94 mg/dL (ref 70–99)
Glucose-Capillary: 97 mg/dL (ref 70–99)

## 2020-02-13 LAB — CBC
HCT: 34.1 % — ABNORMAL LOW (ref 36.0–46.0)
Hemoglobin: 11 g/dL — ABNORMAL LOW (ref 12.0–15.0)
MCH: 29.2 pg (ref 26.0–34.0)
MCHC: 32.3 g/dL (ref 30.0–36.0)
MCV: 90.5 fL (ref 80.0–100.0)
Platelets: 564 10*3/uL — ABNORMAL HIGH (ref 150–400)
RBC: 3.77 MIL/uL — ABNORMAL LOW (ref 3.87–5.11)
RDW: 13.3 % (ref 11.5–15.5)
WBC: 13.3 10*3/uL — ABNORMAL HIGH (ref 4.0–10.5)
nRBC: 0 % (ref 0.0–0.2)

## 2020-02-13 LAB — SODIUM: Sodium: 146 mmol/L — ABNORMAL HIGH (ref 135–145)

## 2020-02-13 LAB — PROTEIN C, TOTAL: Protein C, Total: 112 % (ref 60–150)

## 2020-02-13 LAB — POCT ACTIVATED CLOTTING TIME: Activated Clotting Time: 267 seconds

## 2020-02-13 LAB — PROTEIN S, TOTAL: Protein S Ag, Total: 79 % (ref 60–150)

## 2020-02-13 LAB — PROTEIN C ACTIVITY: Protein C Activity: 121 % (ref 73–180)

## 2020-02-13 IMAGING — XA IR THROMBOECTOMY MECHANICAL VENOUS
1 series · 9 of 24 positions shown · non-contrast
Comparison: MRI brain [DATE].

INDICATION: Stuporous.  Unresponsive.  Occluded dural venous sinuses.

EXAM:
1. EMERGENT LARGE VESSEL OCCLUSION THROMBOLYSIS (POSTERIOR
CIRCULATION)
TECHNIQUE: Following a full explanation of the procedure along with the
potential associated complications, an informed witnessed consent
was obtained from the patient's mother. The risks of intracranial
hemorrhage of 10%, worsening neurological deficit, ventilator
dependency, death and inability to revascularize were all reviewed
in detail with the patient's mother.

[Series 300: ir (person_name) · 9 of 914 slices shown]
[im 40/914]
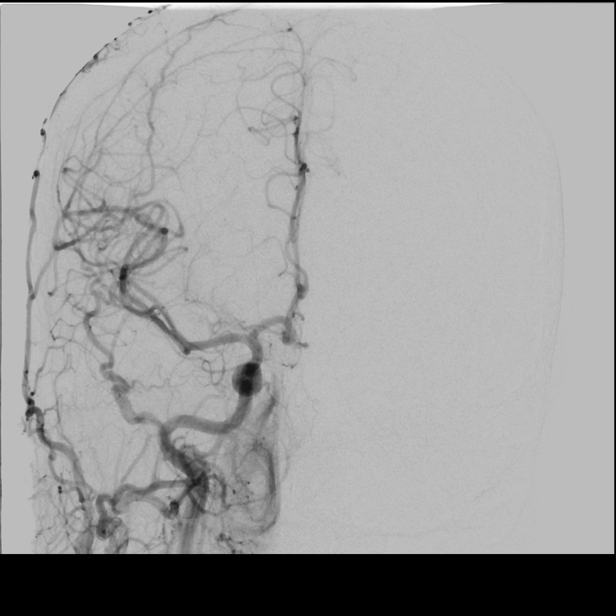
[im 159/914]
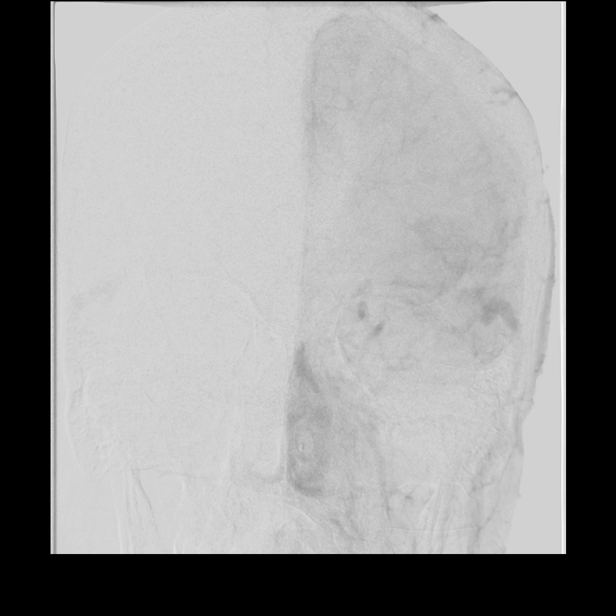
[im 239/914]
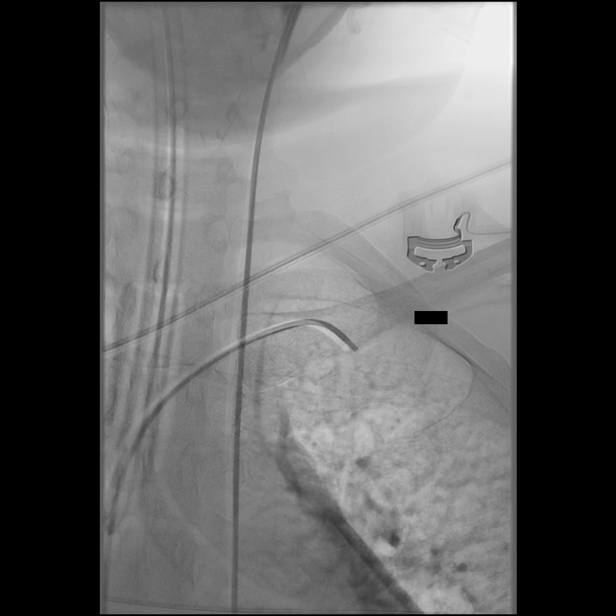
[im 358/914]
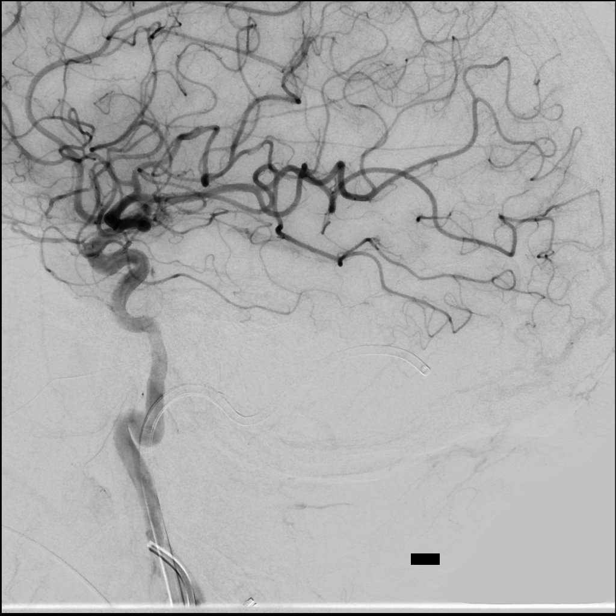
[im 477/914]
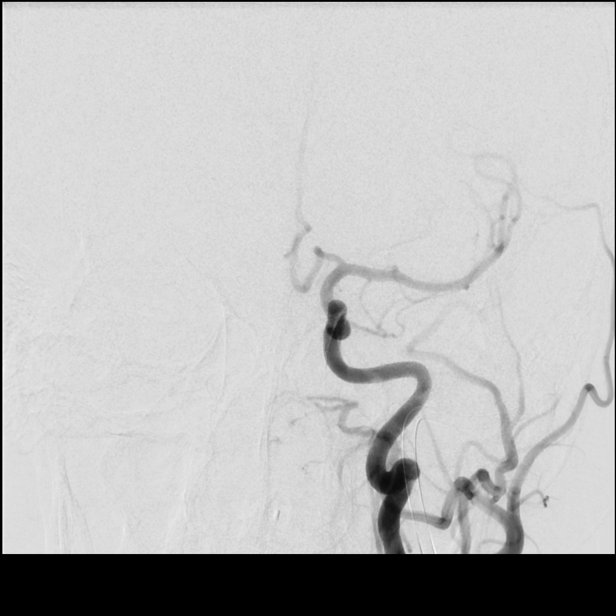
[im 556/914]
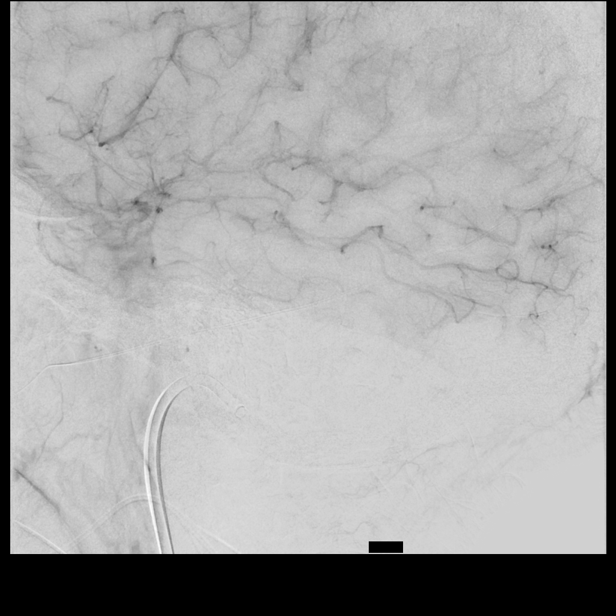
[im 675/914]
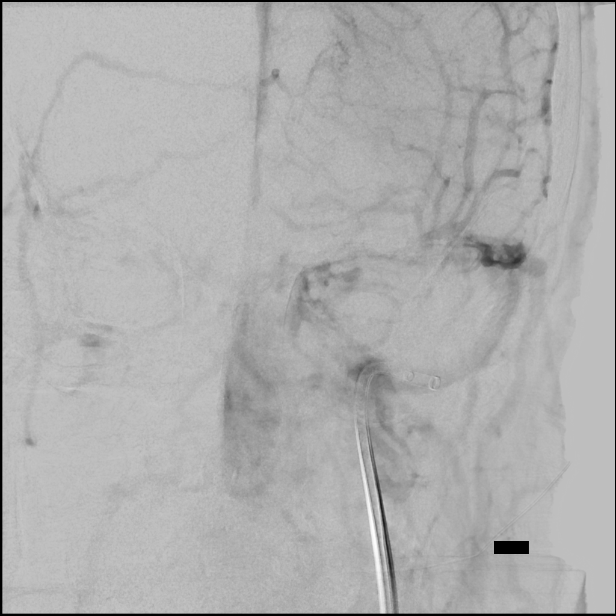
[im 794/914]
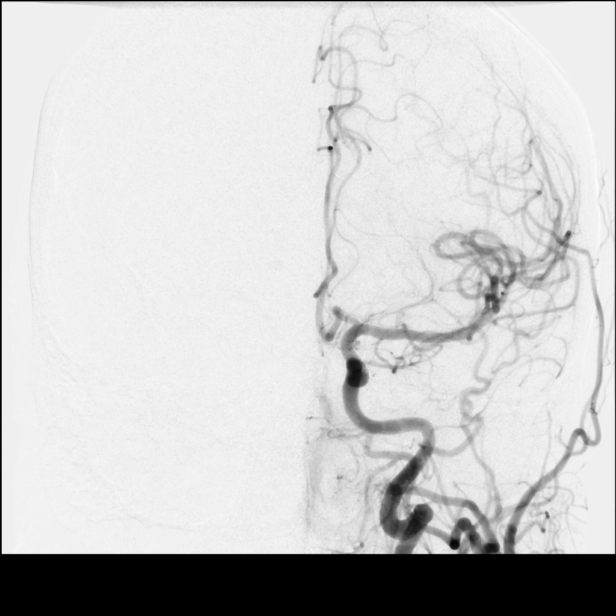
[im 874/914]
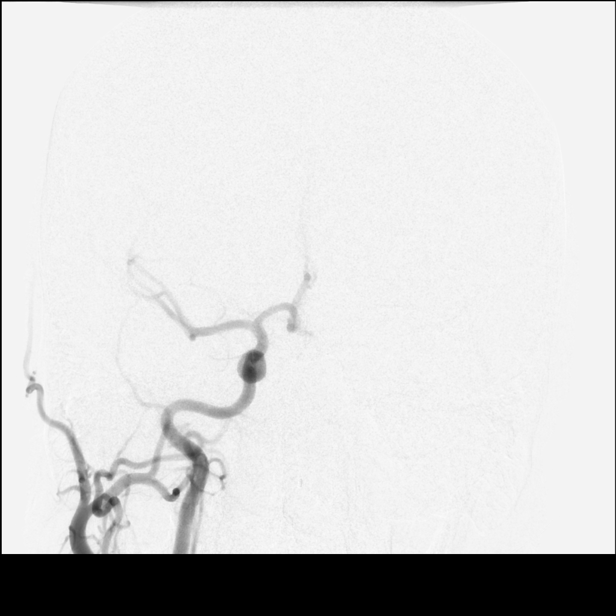

[9 of 24 positions shown; findings below may reference images not displayed]

MRI venogram [DATE].

MEDICATIONS:
Ancef 2 g IV antibiotic was administered within 1 hour of the
procedure.

ANESTHESIA/SEDATION:
General anesthesia

CONTRAST:  Isovue 300 approximately 200 mL.

FLUOROSCOPY TIME:  Fluoroscopy Time: 138 minutes 24 seconds ([VA]
mGy).

COMPLICATIONS:
None immediate.
The patient was then put under general anesthesia by the [REDACTED] at [HOSPITAL].

The right groin was prepped and draped in the usual sterile fashion.
Thereafter using modified Seldinger technique, transfemoral access
into the right common femoral vein was obtained without difficulty.
Over a 0.035 inch guidewire a 5 French Pinnacle sheath was inserted.
Through this, and also over a 0.035 inch guidewire a 5 French JB 1
catheter was advanced and positioned in the left internal jugular
vein to the cranial skull base. The guidewire was removed. Good
aspiration was obtained from hub of the diagnostic catheter. A
gentle control arteriogram confirmed safe position of tip of the JB
1 diagnostic catheter. Over a 0.035 inch 300 cm Rosen exchange
guidewire, an 8 French Neuron Max sheath was then inserted after
removal of the 5 French sheath. The tip of the Neuron Max guide
sheath was positioned in the internal jugular vein at the skull
base. This was then connected to continuous heparinized saline
infusion.

Through the left groin, access into the left common femoral artery
obtained over a 0.018 inch micro guidewire and a micropuncture set.
Over a 0.035 inch J guidewire, a 5 French JB 1 catheter was advanced
to the aortic arch region, and selectively positioned in the right
common carotid artery, the right vertebral artery and the left
common carotid artery.

Finally the tip of the diagnostic catheter was positioned in the
proximal left internal carotid artery and connected to continuous
heparinized saline infusion.
FINDINGS: The right common carotid arteriogram demonstrates the right external
carotid artery and its major branches to be widely patent.

The right internal carotid artery at the bulb to the cranial skull
base opacifies widely. The petrous, cavernous and supraclinoid
segments are widely patent.

The right middle cerebral artery and the right anterior cerebral
artery opacify into the capillary phase.

The delayed venous phase demonstrates engorged cortical veins in the
entirety of the right cerebral hemisphere with stagnation of flow in
the parasagittal venous systems of the posterior [DATE] of the superior
sagittal is noted.

No significant opacification is noted in the transverse sinuses or
sigmoid sinuses or marginal sinuses.

Prominence of the vein of Labbe is seen projecting into a laterally
positioned engorged vein running parallel to the occluded internal
jugular vein.

The right vertebral artery origin is widely patent. Vessel is seen
to opacify normally to the cranial skull base. Patency is maintained
of the right posterior-inferior cerebral artery and the right
vertebrobasilar junction.

The posterior cerebral arteries, the superior cerebellar arteries
and the anterior-inferior cerebellar arteries opacify into the
capillary and venous phases.

The venous phase demonstrates opacification of the distal [DATE] of the
left transverse sinus, the left sigmoid sinus and the left internal
jugular vein.

There is no opacification of the torcula or the inferior sagittal
sinus, or the right transverse or right sigmoid sinus.

The left common carotid arteriogram demonstrates the left external
carotid artery and its major branches to be widely patent.

The left internal carotid artery at the bulb to the cranial skull
base is widely patent.

The petrous, cavernous and supraclinoid segments are functional.

The left middle cerebral artery and the left anterior cerebral
artery opacify into the capillary phase with delayed exit of
contrast in the capillary phase with attenuated caliber of the
cortical veins. No opacification is seen of the distal [DATE] of the
superior sagittal sinus, the torcula or the right-sided dural venous
sinuses. There was no significant opacification seen of the left
transverse sinus either.

However, there is opacification of vein of the Labbe, and the
posterior temporal vein which project into the left transverse sinus
sigmoid sinus junction. Patency of the left sigmoid sinus is noted.
Lateral to this vein projecting parallel to the internal jugular
vein.

PROCEDURE:
Arterial injection through the left common carotid artery into the
venous phase were utilized for venous road maps. Over a 0.035 inch
Roadrunner guidewire a 105 cm 071 Benchmark guide catheter was
advanced to the distal end of the 8 French Neuron Max sheath.

The 035 inch Roadrunner guidewire was easily advanced through the
left internal jugular bulb, the left sigmoid sinus, the left
transverse sinus and into mid right transverse sinus followed by the
Benchmark catheter.

The guidewire was removed. Resistance to aspiration was noted.
Through the Benchmark guide sheath, over a 0.014 inch standard
Synchro micro guidewire, with a J configuration a Marksman 027
microcatheter was then advanced without difficulty to the right
transverse sinus sigmoid sinus junction.

Using a torque device the micro guidewire was eventually advanced
into the distal right sigmoid sinus at the junction with the
internal jugular bulb followed by the microcatheter. The guidewire
was removed. Safe position of the tip of the microcatheter was
confirmed.

A 6 mm x 45 mm Solitaire X retrieval device was then advanced to the
distal end of the microcatheter and deployed in the usual manner by
unsheathing the microcatheter.

Constant aspiration was then applied at the hub of the 6 French
guide catheter now positioned in the distal right transverse sinus
as gradually the retrieval device was retrieved into the guide
sheath and removed. The guide sheath was also retrieved more
proximally into the left sigmoid sinus. Copious amounts of clot were
noted in the aspirate, and also on the stent retriever.

Multiple passes were then made again as described above with access
obtained into the right sigmoid sinus and the right internal jugular
vein followed by deployment of the 6 x 45 retriever. This was then
followed by slow aspiration at the hub of the Benchmark catheter
which continued to retrieve into the left sigmoid sinus. Again 3 or
4 passes were made with copious amounts of clot retrieved into the
canister and also in the aspirate, and in the retrieval device.

Eventually arterial injection in the venous phase demonstrated
significantly improved caliber of the distal right transverse sinus
and the left transverse sinus. A control arteriogram performed now
demonstrates significant decrease in the stagnation of flow in the
cortical veins and the left cerebral hemisphere in general.

Vein of Trolard was now noted to be filling though with nonocclusive
filling defects consistent with clot. No significant improvement was
noted in the superior sagittal sinus.

Flow was also noted to be more brisk in the vein of Labbe and the
left posterior temporal vein. Control arteriograms also demonstrated
flow into the now recanalized right transverse sinus and to the left
transverse sinus and egress into the left sigmoid sinus and left
internal jugular vein.

Despite multiple attempts with the microcatheter and retriever in
the right sigmoid sinus, no significant opacification was realized
despite removal of clot.

Advancement at the right sigmoid sinus transverse sinus junction was
met with significant resistance raising the possibly of an
underlying severe transverse sinus sigmoid sinus stenosis.

Multiple attempts were also made to access the superior sagittal
sinus through the torcula which was met with significant resistance.

Multiple attempts were made to access the superior sagittal sinus,
and the torcula. Access was obtained into the straight sinus, and
with retriever and aspiration clot was retrieved from this site.

In view of the amount of contrast utilized, and the restoration of
antegrade flow in the right transverse sinus, the left transverse
sinus sigmoid sinus, and significantly improved flow through the
left cerebral hemisphere, the procedure was stopped.

A CT of the brain demonstrated no evidence of intracranial
hemorrhages, or mass effect.

There appeared to be generalized improvement in the left cerebral
hemispheric swelling.

Hyperemia evident in the left parietooccipital region, region of
prior infarct.

On the CT also noted was the posterior cerebral veins, the vein of
FATKA and the straight sinus. The 8 French Neuron Max was removed
and replaced with a short 8 French Pinnacle sheath which was
connected to continuous heparinized saline infusion.

The 5 French left common femoral artery sheath was left in-situ, and
be readily available in case of a second treatment.

Patient was left intubated and returned to the floor in stable
neurologic condition.
IMPRESSION: Status post endovascular revascularization of occluded right
transverse sinus, left transverse sinus and partially the right
sigmoid sinus with significant improved flow and decompression of
the left cerebral hemispheric venous system.

Partial recanalization of the vein of Trolard noted.

PLAN:
Follow patient clinically for signs of decrease in intracranial
pressure, increased improvement and improved sensorium. Probable
repeat MRI of the brain and MRA of the brain in a day or two.

Continue with IV heparin treatment.

## 2020-02-13 IMAGING — DX DG CHEST 1V PORT
1 series · 1 of 1 positions shown · non-contrast
Comparison: Respiratory

CLINICAL DATA: [DATE]

EXAM:
PORTABLE CHEST 1 VIEW

[chest ap]
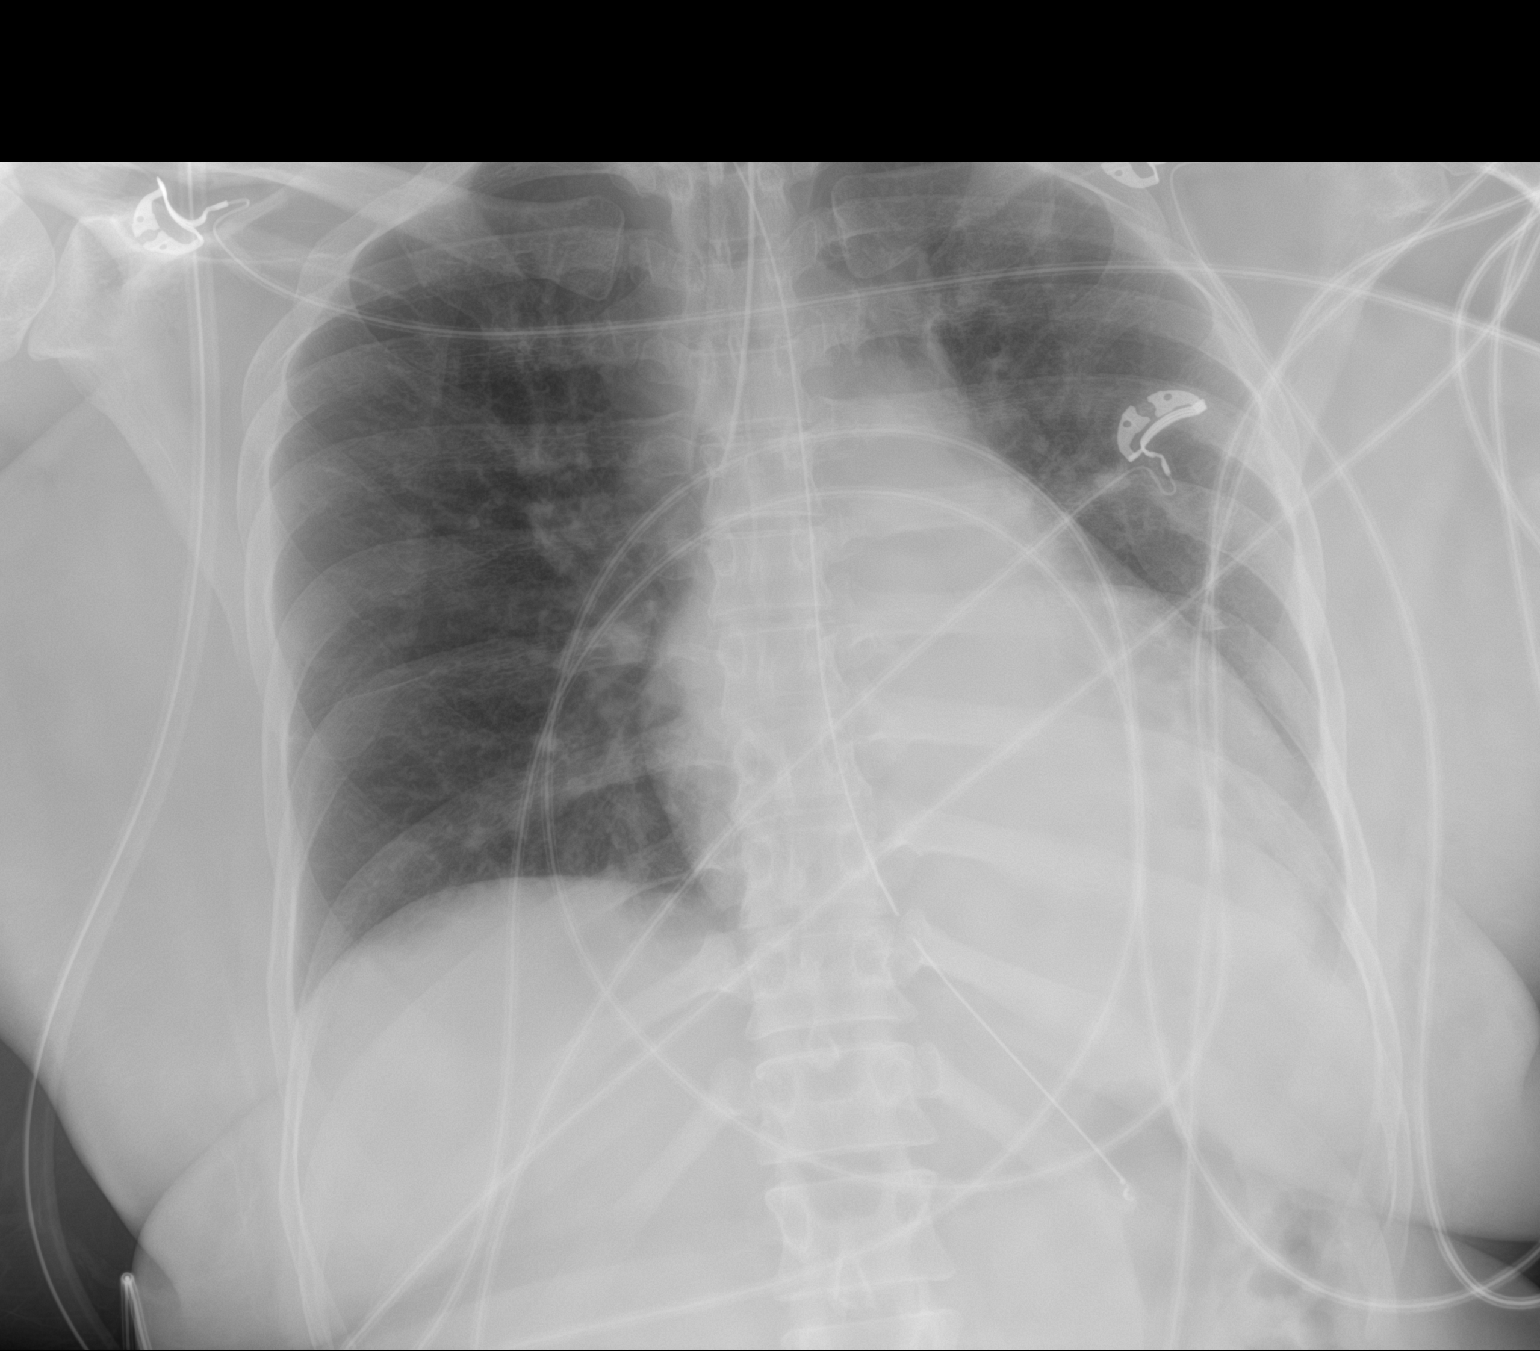

[1 of 1 positions shown; findings below may reference images not displayed]

FINDINGS: Later endotracheal tube is seen with its tip just within the right
mainstem bronchus. Nasogastric tube tip is seen within the proximal
body of the stomach with its proximal side hole at the expected
gastroesophageal junction.

Retrocardiac opacity likely relates to partial left lower lobe
collapse. Right lung is clear. No pneumothorax or pleural effusion.
Cardiac size within normal limits. Pulmonary vascularity is normal.
IMPRESSION: Right mainstem intubation. Withdrawal of the endotracheal tube by
approximately 2.5 cm may better position the catheter.

Partial left lower lobe collapse.

Nasogastric tube proximal side hole at the gastroesophageal
junction. Advancement by roughly 5 cm may better position the
catheter.

These results will be called to the ordering clinician or
representative by the Radiologist Assistant, and communication
documented in the PACS or [REDACTED].

## 2020-02-13 SURGERY — IR WITH ANESTHESIA
Anesthesia: General

## 2020-02-13 MED ORDER — SODIUM CHLORIDE 0.9 % IV SOLN: INTRAVENOUS | Status: DC

## 2020-02-13 MED ORDER — ONDANSETRON HCL 4 MG/2ML IJ SOLN
INTRAMUSCULAR | Status: DC | PRN
  Administered 2020-02-13: 4 mg via INTRAVENOUS

## 2020-02-13 MED ORDER — ACETAMINOPHEN 160 MG/5ML PO SOLN: 650.0000 mg | ORAL | Status: DC | PRN

## 2020-02-13 MED ORDER — PROPOFOL 1000 MG/100ML IV EMUL
0.0000 ug/kg/min | INTRAVENOUS | Status: DC
  Administered 2020-02-13: 50 ug/kg/min via INTRAVENOUS
  Administered 2020-02-14: 25 ug/kg/min via INTRAVENOUS
  Administered 2020-02-14: 35 ug/kg/min via INTRAVENOUS
  Administered 2020-02-14: 30 ug/kg/min via INTRAVENOUS
  Administered 2020-02-15: 25 ug/kg/min via INTRAVENOUS
  Administered 2020-02-15: 15 ug/kg/min via INTRAVENOUS
  Administered 2020-02-15: 40 ug/kg/min via INTRAVENOUS
  Administered 2020-02-16: 30 ug/kg/min via INTRAVENOUS
  Filled 2020-02-13 (×7): qty 100

## 2020-02-13 MED ORDER — SODIUM CHLORIDE 3 % IV BOLUS
250.0000 mL | Freq: Once | INTRAVENOUS | Status: AC
  Administered 2020-02-13: 250 mL via INTRAVENOUS
  Filled 2020-02-13: qty 250

## 2020-02-13 MED ORDER — SODIUM CHLORIDE 0.9 % IV SOLN
INTRAVENOUS | Status: AC
  Filled 2020-02-13: qty 100

## 2020-02-13 MED ORDER — POLYETHYLENE GLYCOL 3350 17 G PO PACK
17.0000 g | PACK | Freq: Every day | ORAL | Status: DC
  Administered 2020-02-14 – 2020-02-17 (×3): 17 g
  Filled 2020-02-13 (×3): qty 1

## 2020-02-13 MED ORDER — ACETAMINOPHEN 325 MG PO TABS: 650.0000 mg | ORAL_TABLET | ORAL | Status: DC | PRN

## 2020-02-13 MED ORDER — LIDOCAINE HCL 1 % IJ SOLN
INTRAMUSCULAR | Status: AC
  Filled 2020-02-13: qty 20

## 2020-02-13 MED ORDER — REMIFENTANIL HCL 2 MG IV SOLR
INTRAVENOUS | Status: AC
  Filled 2020-02-13: qty 2000

## 2020-02-13 MED ORDER — FENTANYL BOLUS VIA INFUSION
50.0000 ug | INTRAVENOUS | Status: DC | PRN
Start: 2020-02-13 — End: 2020-02-17
  Administered 2020-02-14 – 2020-02-15 (×2): 50 ug via INTRAVENOUS
  Filled 2020-02-13: qty 50

## 2020-02-13 MED ORDER — ALBUTEROL SULFATE (2.5 MG/3ML) 0.083% IN NEBU: 2.5000 mg | INHALATION_SOLUTION | RESPIRATORY_TRACT | Status: DC | PRN

## 2020-02-13 MED ORDER — CEFAZOLIN SODIUM-DEXTROSE 2-4 GM/100ML-% IV SOLN
INTRAVENOUS | Status: AC
  Filled 2020-02-13: qty 100

## 2020-02-13 MED ORDER — REMIFENTANIL HCL 2 MG IV SOLR
INTRAVENOUS | Status: DC | PRN
  Administered 2020-02-13: .1 ug/kg/min via INTRAVENOUS
  Administered 2020-02-13: .25 ug/kg/min via INTRAVENOUS

## 2020-02-13 MED ORDER — CHLORHEXIDINE GLUCONATE 0.12 % MT SOLN
OROMUCOSAL | Status: AC
  Filled 2020-02-13: qty 15

## 2020-02-13 MED ORDER — SODIUM CHLORIDE 0.9 % IV BOLUS: 500.0000 mL | Freq: Once | INTRAVENOUS | Status: DC

## 2020-02-13 MED ORDER — CEFAZOLIN SODIUM-DEXTROSE 2-3 GM-%(50ML) IV SOLR
INTRAVENOUS | Status: DC | PRN
  Administered 2020-02-13: 2 g via INTRAVENOUS

## 2020-02-13 MED ORDER — SODIUM CHLORIDE 3 % IV SOLN
INTRAVENOUS | Status: DC
  Filled 2020-02-13 (×15): qty 500

## 2020-02-13 MED ORDER — FENTANYL CITRATE (PF) 100 MCG/2ML IJ SOLN
50.0000 ug | Freq: Once | INTRAMUSCULAR | Status: AC
Start: 2020-02-14 — End: 2020-02-13

## 2020-02-13 MED ORDER — HEPARIN SODIUM (PORCINE) 1000 UNIT/ML IJ SOLN
INTRAMUSCULAR | Status: DC | PRN
  Administered 2020-02-13: 1000 [IU] via INTRAVENOUS
  Administered 2020-02-13: 3000 [IU] via INTRAVENOUS

## 2020-02-13 MED ORDER — ASPIRIN 81 MG PO CHEW: 81.0000 mg | CHEWABLE_TABLET | Freq: Every day | ORAL | Status: DC

## 2020-02-13 MED ORDER — CLEVIDIPINE BUTYRATE 0.5 MG/ML IV EMUL: 0.0000 mg/h | INTRAVENOUS | Status: DC

## 2020-02-13 MED ORDER — ROCURONIUM BROMIDE 10 MG/ML (PF) SYRINGE
PREFILLED_SYRINGE | INTRAVENOUS | Status: DC | PRN
  Administered 2020-02-13: 20 mg via INTRAVENOUS
  Administered 2020-02-13: 10 mg via INTRAVENOUS
  Administered 2020-02-13: 50 mg via INTRAVENOUS
  Administered 2020-02-13: 20 mg via INTRAVENOUS
  Administered 2020-02-13: 50 mg via INTRAVENOUS

## 2020-02-13 MED ORDER — CLEVIDIPINE BUTYRATE 0.5 MG/ML IV EMUL
0.0000 mg/h | INTRAVENOUS | Status: AC
  Administered 2020-02-13: 2 mg/h via INTRAVENOUS
  Filled 2020-02-13: qty 50

## 2020-02-13 MED ORDER — IOHEXOL 300 MG/ML  SOLN
150.0000 mL | Freq: Once | INTRAMUSCULAR | Status: AC | PRN
  Administered 2020-02-13: 60 mL via INTRA_ARTERIAL

## 2020-02-13 MED ORDER — IOHEXOL 300 MG/ML  SOLN
150.0000 mL | Freq: Once | INTRAMUSCULAR | Status: AC | PRN
  Administered 2020-02-13: 100 mL via INTRA_ARTERIAL

## 2020-02-13 MED ORDER — POTASSIUM CHLORIDE 20 MEQ PO PACK: 40.0000 meq | PACK | Freq: Two times a day (BID) | ORAL | Status: DC

## 2020-02-13 MED ORDER — FENTANYL CITRATE (PF) 250 MCG/5ML IJ SOLN
INTRAMUSCULAR | Status: DC | PRN
  Administered 2020-02-13 (×2): 50 ug via INTRAVENOUS

## 2020-02-13 MED ORDER — TICAGRELOR 90 MG PO TABS: 90.0000 mg | ORAL_TABLET | Freq: Two times a day (BID) | ORAL | Status: DC

## 2020-02-13 MED ORDER — PROPOFOL 500 MG/50ML IV EMUL
INTRAVENOUS | Status: DC | PRN
  Administered 2020-02-13: 50 ug/kg/min via INTRAVENOUS

## 2020-02-13 MED ORDER — DOCUSATE SODIUM 50 MG/5ML PO LIQD
100.0000 mg | Freq: Two times a day (BID) | ORAL | Status: DC
  Administered 2020-02-13 – 2020-02-17 (×7): 100 mg
  Filled 2020-02-13 (×7): qty 10

## 2020-02-13 MED ORDER — PHENYLEPHRINE HCL-NACL 10-0.9 MG/250ML-% IV SOLN
INTRAVENOUS | Status: DC | PRN
  Administered 2020-02-13: 25 ug/min via INTRAVENOUS

## 2020-02-13 MED ORDER — DEXAMETHASONE SODIUM PHOSPHATE 10 MG/ML IJ SOLN
INTRAMUSCULAR | Status: DC | PRN
  Administered 2020-02-13: 5 mg via INTRAVENOUS

## 2020-02-13 MED ORDER — FENTANYL CITRATE (PF) 100 MCG/2ML IJ SOLN
INTRAMUSCULAR | Status: AC
  Administered 2020-02-13: 50 ug via INTRAVENOUS
  Filled 2020-02-13: qty 2

## 2020-02-13 MED ORDER — PANTOPRAZOLE SODIUM 40 MG IV SOLR
40.0000 mg | Freq: Every day | INTRAVENOUS | Status: DC
  Administered 2020-02-14 – 2020-02-16 (×4): 40 mg via INTRAVENOUS
  Filled 2020-02-13 (×4): qty 40

## 2020-02-13 MED ORDER — PROPOFOL 10 MG/ML IV BOLUS
INTRAVENOUS | Status: DC | PRN
  Administered 2020-02-13: 90 mg via INTRAVENOUS

## 2020-02-13 MED ORDER — IOHEXOL 300 MG/ML  SOLN
150.0000 mL | Freq: Once | INTRAMUSCULAR | Status: AC | PRN
  Administered 2020-02-13: 75 mL via INTRA_ARTERIAL

## 2020-02-13 MED ORDER — ACETAMINOPHEN 650 MG RE SUPP: 650.0000 mg | RECTAL | Status: DC | PRN

## 2020-02-13 MED ORDER — SODIUM CHLORIDE 0.9 % IV SOLN
INTRAVENOUS | Status: DC
Start: 2020-02-14 — End: 2020-02-13

## 2020-02-13 MED ORDER — LIDOCAINE 2% (20 MG/ML) 5 ML SYRINGE
INTRAMUSCULAR | Status: DC | PRN
  Administered 2020-02-13: 60 mg via INTRAVENOUS

## 2020-02-13 MED ORDER — POTASSIUM CHLORIDE 20 MEQ PO PACK
40.0000 meq | PACK | Freq: Three times a day (TID) | ORAL | Status: AC
  Administered 2020-02-14 (×3): 40 meq
  Filled 2020-02-13 (×3): qty 2

## 2020-02-13 MED ORDER — NITROGLYCERIN 1 MG/10 ML FOR IR/CATH LAB
INTRA_ARTERIAL | Status: AC
  Filled 2020-02-13: qty 10

## 2020-02-13 MED ORDER — FENTANYL 2500MCG IN NS 250ML (10MCG/ML) PREMIX INFUSION
50.0000 ug/h | INTRAVENOUS | Status: DC
  Administered 2020-02-13: 50 ug/h via INTRAVENOUS
  Administered 2020-02-14: 125 ug/h via INTRAVENOUS
  Administered 2020-02-15: 100 ug/h via INTRAVENOUS
  Administered 2020-02-16: 115 ug/h via INTRAVENOUS
  Filled 2020-02-13 (×5): qty 250

## 2020-02-13 MED ORDER — SODIUM CHLORIDE 0.9 % IV SOLN: INTRAVENOUS | Status: DC | PRN

## 2020-02-13 MED ORDER — HEPARIN (PORCINE) 25000 UT/250ML-% IV SOLN
750.0000 [IU]/h | INTRAVENOUS | Status: DC
  Administered 2020-02-14: 750 [IU]/h via INTRAVENOUS
  Filled 2020-02-13: qty 250

## 2020-02-13 MED ORDER — HEPARIN (PORCINE) 25000 UT/250ML-% IV SOLN: 500.0000 [IU]/h | INTRAVENOUS | Status: DC

## 2020-02-13 MED ORDER — CHLORHEXIDINE GLUCONATE 0.12 % MT SOLN
15.0000 mL | OROMUCOSAL | Status: AC
  Administered 2020-02-13: 15 mL via OROMUCOSAL
  Filled 2020-02-13 (×2): qty 15

## 2020-02-13 NOTE — Progress Notes (Addendum)
ANTICOAGULATION CONSULT NOTE  Pharmacy Consult:  Heparin Indication: Dural venous sinus thrombosis  No Known Allergies  Patient Measurements: Height: 5\' 5"  (165.1 cm) Weight: 68.4 kg (150 lb 12.7 oz) IBW/kg (Calculated) : 57 Heparin Dosing Weight: 68 kg  Vital Signs: Temp: 99.5 F (37.5 C) (01/21 0800) Temp Source: Axillary (01/21 0800) BP: 101/55 (01/21 0900) Pulse Rate: 94 (01/21 0900)  Labs: Recent Labs    02/10/20 2110 02/12/20 0127 02/12/20 0417 02/12/20 1600 02/13/20 0333 02/13/20 1008  HGB 13.6 12.6  --   --  11.0*  --   HCT 41.2 37.0  --   --  34.1*  --   PLT 634* 578*  --   --  564*  --   LABPROT  --   --  14.7  --   --   --   INR  --   --  1.2  --   --   --   HEPARINUNFRC  --   --   --  <0.10* 0.48 0.36  CREATININE 0.82 0.61  --   --   --   --     Estimated Creatinine Clearance: 95.4 mL/min (by C-G formula based on SCr of 0.61 mg/dL).   Assessment: 57 YOF with AMS, found to have a small volume subarachnoid hemorrhage and extensive dural venous thrombosis.  Pharmacy consulted to manage IV heparin per stroke protocol.   Confirmatory heparin level remains therapeutic.  Bleeding from mouth resolved.  Goal of Therapy:  Heparin level 0.3-0.5 units/ml Monitor platelets by anticoagulation protocol: Yes   Plan:  Continue heparin gtt at 1000 units/hr Daily heparin level and CBC Monitor for s/sx of bleeding F/U AC post IR  Macaila Tahir D. Mina Marble, PharmD, BCPS, [place] 02/13/2020, 12:47 PM

## 2020-02-13 NOTE — Progress Notes (Signed)
ETT retracted 2.5 cm per MD order. ETT at 22 at the lips

## 2020-02-13 NOTE — Progress Notes (Signed)
SLP Cancellation Note  Patient Details Name: ALESHKA CORNEY MRN: [id number] DOB: 10/29/1985   Cancelled treatment:       Reason Eval/Treat Not Completed: Patient at procedure or test/unavailable.    Braeleigh Pyper, Katherene Ponto 02/13/2020, 10:40 AM

## 2020-02-13 NOTE — Progress Notes (Signed)
PT Cancellation Note  Patient Details Name: Jill Clarke MRN: [id number] DOB: 1985-05-07   Cancelled Treatment:    Reason Eval/Treat Not Completed: Medical issues which prohibited therapy - PT to check back.  Stacie Glaze, PT Acute Rehabilitation Services Pager [phone]  Office [phone]    Louis Matte 02/13/2020, 11:50 AM

## 2020-02-13 NOTE — Progress Notes (Signed)
Patient bladder scanned and it read >500. When this RN went to in and out patient, patient immediately began to urinate on own as soon as tip of catheter was inserted. Catheter was removed and patient urinated 250 cc. While RN was cleaning up patient, thick, purulent-pink discharge fell out of patient's vagina. Unknown etiology. Patient cleaned up. Will continue to monitor for more discharge.

## 2020-02-13 NOTE — Anesthesia Procedure Notes (Signed)
Arterial Line Insertion Start/End1/21/2022 6:20 PM, 02/13/2020 6:20 PM Performed by: Kathryne Hitch, CRNA, CRNA  Patient location: Pre-op. Preanesthetic checklist: patient identified, IV checked, site marked, risks and benefits discussed, surgical consent, monitors and equipment checked, pre-op evaluation, timeout performed and anesthesia consent Lidocaine 1% used for infiltration Left, radial was placed Catheter size: 20 Fr Hand hygiene performed  and maximum sterile barriers used   Attempts: 1 Procedure performed without using ultrasound guided technique. Following insertion, dressing applied and Biopatch. Post procedure assessment: normal and unchanged  Patient tolerated the procedure well with no immediate complications.

## 2020-02-13 NOTE — Progress Notes (Addendum)
STROKE TEAM PROGRESS NOTE  HPI per record: Jill Clarke is a 35 y.o. female with a history significant for asthma and sickle cell trait. Jill Clarke presented to the ED 02/11/20 when Jill Clarke mother noticed she had altered mental status at home. According to Jill Clarke mother, Jill Clarke has not been feeling well for approximately one week with headache, nausea, vomiting, and diarrhea. She was at a friends house during these initial symptoms. She called EMS and was taken to the ED for evaluation yesterday (02/10/2020) and was discharged after a successful PO challenge. She then drove to Jill Clarke mothers house and took a nap for about 2 hours. When she woke, Jill Clarke mother states she was not behaving normally. She was rubbing the comforter excessively, turning the light switch on and off repetitively, and was not verbally responsive so she was brought back to the ED. A CT head was obtained revealing a small volume subarachnoid hemorrhage and hyperdense dural sinuses with a filling defect concerning for dural venous thrombosis. 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 Neurology was consulted for further management.   INTERVAL HISTORY Jill Clarke evaluated at bedside this morning, brother present in the room Jill Clarke.  Jill Clarke is stuporous, only responds to noxious stimuli with some mumbling and moves all 4 extremities.  Jill Clarke imaging study shows acute extensive dural venous sinus thrombosis.  Stroke team is concerned about Jill Clarke's condition due to multiple reasons like Jill Clarke young age, no certain reason for hypercoagulability and extensive venous sinus thrombosis.  Dr. Estanislado Pandy is consulted if he would be able to do IR thrombectomy and he agreed, appreciate his assistance.  It is going to be a prolonged and complicated procedure    It will take long hours.  Mother is informed via telephone and brother in person about complexity of Jill Clarke's condition and they shows fair  understanding about it.  Family states as far as they know Jill Clarke never had COVID infection and never took COVID-vaccine as well.  Initial COVID testing in hospital is negative but due to high suspicion we are repeating it.  Hypercoagulable panel is pending as well.  IV heparin has been started with vigorous IV fluids as well.  See below for details of neurological exam.  Vitals:   02/13/20 0700 02/13/20 0800 02/13/20 0900 02/13/20 1101  BP: 105/71 104/79 (!) 101/55   Pulse: (!) 107 (!) 120 94   Resp: (!) 23 20 (!) 22   Temp:  99.5 F (37.5 C)    TempSrc:  Axillary    SpO2: 98% 97% 97%   Weight:    68.4 kg  Height:    5\' 5"  (1.651 m)   CBC:  Recent Labs  Lab 02/12/20 0127 02/13/20 0333  WBC 13.6* 13.3*  HGB 12.6 11.0*  HCT 37.0 34.1*  MCV 88.1 90.5  PLT 578* 093*   Basic Metabolic Panel:  Recent Labs  Lab 02/10/20 2110 02/12/20 0127  NA 135 137  K 2.9* 3.5  CL 95* 103  CO2 18* 20*  GLUCOSE 150* 168*  BUN 12 9  CREATININE 0.82 0.61  CALCIUM 9.4 9.3   Lipid Panel:  Recent Labs  Lab 02/13/20 0333  CHOL 148  TRIG 60  HDL 28*  CHOLHDL 5.3  VLDL 12  LDLCALC 108*   HgbA1c:  Recent Labs  Lab 02/13/20 0333  HGBA1C 4.9   Urine Drug Screen:  Recent Labs  Lab 02/12/20 0701  LABOPIA NONE DETECTED  COCAINSCRNUR NONE  DETECTED  LABBENZ NONE DETECTED  AMPHETMU NONE DETECTED  THCU NONE DETECTED  LABBARB NONE DETECTED    Alcohol Level  Recent Labs  Lab 02/12/20 0417  ETH <10    IMAGING past 24 hours  CT Head wo contrast 02/12/2020  IMPRESSION: 1. The study is positive for a small volume of subarachnoid hemorrhage. There is no midline shift. 2. Hyperdense dural sinuses and cortical veins especially on the right in addition to a filling defect within the transverse sinus on the right is concerning for dural venous thrombosis and may be the source of the Jill Clarke's subarachnoid hemorrhage. Emergent MRI/MRV is recommended for further evaluation.  MR  Brain wo contrast MR Venogram Head 02/12/2020  IMPRESSION: 1. Extensive, acute dural venous sinus thrombosis involving the superior sagittal sinus into the right internal jugular vein via the dominant right transverse and sigmoid system. Both veins of [place] are thrombosed and there is straight sinus/deep cerebral thrombosis as well. 2. Deep watershed white matter infarcts, small volume subarachnoid hemorrhage, and left more than right frontoparietal cortical swelling due to #1.  EEG adult 02/12/2020  IMPRESSION: This study is suggestive of moderate diffuse encephalopathy, nonspecific etiology.  At 1531, Jill Clarke was noted to have right upper extremity jerking.  Concomitant EEG showed high amplitude 2 to 3 Hz rhythmic delta slowing in bitemporal region with evolution in morphology and amplitude but without definite evolution in frequency concerning for focal motor seizure.   PHYSICAL EXAM GENERAL: Somnolent, laying in bed, in no acute distress Head: Normocephalic and atraumatic. EENT: No OP obstruction, normal conjunctiva.  LUNGS - Normal respiratory effort on room air CV - Tachycardic to the 130's on cardiac monitor, 2+ pedal pulses ABDOMEN - Soft, non-distended Ext: warm, well perfused Neurological:  Mental Status: Stuporous, does not respond to verbal stimuli, does not follow commands.  Opens eyes partially but cannot sustain attention Cranial Nerves: Down gaze deviation, doll's eye reflex + Motor: Withdraws all 4 extremities to noxious stimuli.  But moves left side greater than right Sensory: Unable to access. Deep Tendon Reflexes:  Right: Upper Extremity   Left: Upper extremity   biceps (C-5 to C-6) 2/4   biceps (C-5 to C-6) 2/4 tricep (C7) 2/4    triceps (C7) 2/4 Brachioradialis (C6) 2/4  Brachioradialis (C6) 2/4  Lower Extremity Lower Extremity  quadriceps (L-2 to L-4) 2/4   quadriceps (L-2 to L-4) 2/4 Achilles (S1) 2/4   Achilles (S1) 2/4 Gait:  Deferred   ASSESSMENT/PLAN Jill Clarke is a 35 y.o. female with PMHx of asthma and sickle cell trait presented with altered mental status.  CT head revealed a small volume subarachnoid hemorrhage and hyperdense dural sinuses with filling defect concerning for dural venous thrombosis.  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  Extremely delicate situation, that is why, IR is consulted for thrombectomy and Jill Clarke is with IR now.  Stroke: Deep watershed white matter infarct, small volume subarachnoid hemorrhage and acute dural venous sinus thrombosis involving superior sagittal sinus into the right internal jugular vein via the dominant right transverse and sigmoid system, likely due to dehydration at this point but looking into hypercoagulable panel, any recent COVID infection.     Code Stroke CT head: positive for a small volume of subarachnoid hemorrhage. There is no midline shift. Hyperdense dural sinuses and cortical veins especially on the right in addition to a filling defect within the transverse sinus on the right is concerning for dural  venous thrombosis and may be the source of the Jill Clarke's subarachnoid hemorrhage.  MRI  and MRA: A). Extensive, acute dural venous sinus thrombosis involving the superior sagittal sinus into the right internal jugular vein via the dominant right transverse and sigmoid system.                                   B).Both veins of [place] are thrombosed and there is straight sinus/deep cerebral thrombosis as well.                                  C). Deep watershed white matter infarcts, small volume subarachnoid hemorrhage, and left more than right frontoparietal cortical swelling due to #1.  2D Echo: not ordered  IR Thrombectomy: pending  LDL 108  HgbA1c 4.9  Hypercoagulable panel: Pending  Repeat COVID testing: Pending  VTE prophylaxis -IV heparin    Diet   Diet NPO time  specified    No antithrombotic prior to admission, now on aspirin 300 mg suppository daily.   Therapy recommendations: Pending  Disposition: Pending  Hypertension  Home meds: None  Stable  Strict blood pressure control  Long-term BP goal normotensive  Hyperlipidemia  Home meds: None  LDL 108, goal < 70  Start statin when Jill Clarke stable  Continue statin at discharge   Other Stroke Risk Factors Sickle Cell Trait: No associated risk found due to sickle cell trait.   Other Active Problems  Asthma  Hospital day # 1  I have personally obtained history,examined this Jill Clarke, reviewed notes, independently viewed imaging studies, participated in medical decision making and plan of care.ROS completed by me personally and pertinent positives fully documented  I have made any additions or clarifications directly to the above note. Agree with note above.  Jill Clarke has presented with extensive cerebral venous sinus thrombosis involving both deep cerebral veins as well as superficial surgical sinus, transverse and sigmoid sinus and has significant swelling of the brainstem and diencephalon accounting for Jill Clarke poor wakefulness..  Recommend aggressive hydration with normal saline 120 cc an hour as well as IV heparin drip.  The Jill Clarke's clot burden is too extensive for heparin alone and she may benefit with attempt at mechanical thrombectomy or intrasinus tPA thrombolytic administration.  Long discussion with the Jill Clarke's brother at the bedside and with mother over the phone as well as Dr. Estanislado Pandy.  We will consult Dr. Doyne Keel neuro critical care to help with medical management as well.  Start hypertonic saline at 75 cc an hour through peripheral IV cytotoxic edema.  Repeat brain scan tomorrow morning. This Jill Clarke is critically ill and at significant risk of neurological worsening, death and care requires constant monitoring of vital signs, hemodynamics,respiratory and cardiac monitoring,  extensive review of multiple databases, frequent neurological assessment, discussion with family, other specialists and medical decision making of high complexity.I have made any additions or clarifications directly to the above note.This critical care time does not reflect procedure time, or teaching time or supervisory time of PA/NP/Med Resident etc but could involve care discussion time.  I spent 90 minutes of neurocritical care time  in the care of  this Jill Clarke.     Antony Contras, MD Medical Director [facility] Pager: [phone] 02/13/2020 4:53 PM   To contact Stroke Continuity provider, please refer to [email or web address] After hours, contact General Neurology

## 2020-02-13 NOTE — Transfer of Care (Signed)
Immediate Anesthesia Transfer of Care Note  Patient: Jill Clarke  Procedure(s) Performed: IR WITH ANESTHESIA (N/A )  Patient Location: ICU  Anesthesia Type:General  Level of Consciousness: sedated  Airway & Oxygen Therapy: Patient remains intubated per anesthesia plan and Patient placed on Ventilator (see vital sign flow sheet for setting)  Post-op Assessment: Report given to RN and Post -op Vital signs reviewed and stable  Post vital signs: Reviewed and stable  Last Vitals:  Vitals Value Taken Time  BP 120/92 02/13/20 2248  Temp    Pulse 97 02/13/20 2256  Resp 15 02/13/20 2256  SpO2 100 % 02/13/20 2256  Vitals shown include unvalidated device data.  Last Pain:  Vitals:   02/13/20 1500  TempSrc: Axillary         Complications: No complications documented.

## 2020-02-13 NOTE — Anesthesia Preprocedure Evaluation (Addendum)
Anesthesia Evaluation  Patient identified by MRN, date of birth, ID band Patient unresponsive    Reviewed: Allergy & Precautions, NPO status , Patient's Chart, lab work & pertinent test results  Airway Mallampati: II   Neck ROM: Full    Dental  (+) Teeth Intact   Pulmonary asthma , former smoker,    Pulmonary exam normal        Cardiovascular negative cardio ROS   Rhythm:Regular Rate:Normal     Neuro/Psych SAH, dural venous thrombosis negative psych ROS   GI/Hepatic negative GI ROS, Neg liver ROS,   Endo/Other  diabetes, Well Controlled  Renal/GU negative Renal ROS  negative genitourinary   Musculoskeletal negative musculoskeletal ROS (+)   Abdominal (+)  Abdomen: soft.    Peds  Hematology  (+) Sickle cell trait ,   Anesthesia Other Findings   Reproductive/Obstetrics negative OB ROS                            Anesthesia Physical Anesthesia Plan  ASA: III  Anesthesia Plan: General   Post-op Pain Management:    Induction: Intravenous  PONV Risk Score and Plan: 3 and Ondansetron, Dexamethasone, Midazolam and Treatment may vary due to age or medical condition  Airway Management Planned: Mask and Oral ETT  Additional Equipment: Arterial line  Intra-op Plan:   Post-operative Plan: Possible Post-op intubation/ventilation  Informed Consent: I have reviewed the patients History and Physical, chart, labs and discussed the procedure including the risks, benefits and alternatives for the proposed anesthesia with the patient or authorized representative who has indicated his/her understanding and acceptance.     Dental advisory given  Plan Discussed with: CRNA  Anesthesia Plan Comments: (Lab Results      Component                Value               Date                      WBC                      13.3 (H)            02/13/2020                HGB                      11.0 (L)             02/13/2020                HCT                      34.1 (L)            02/13/2020                MCV                      90.5                02/13/2020                PLT                      564 (H)  02/13/2020           Lab Results      Component                Value               Date                      NA                       137                 02/12/2020                K                        3.5                 02/12/2020                CO2                      20 (L)              02/12/2020                GLUCOSE                  168 (H)             02/12/2020                BUN                      9                   02/12/2020                CREATININE               0.61                02/12/2020                CALCIUM                  9.3                 02/12/2020                GFRNONAA                 >60                 02/12/2020                GFRAA                    60 (L)              12/01/2016          )       Anesthesia Quick Evaluation

## 2020-02-13 NOTE — Consult Note (Signed)
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 PCCM consulted for possible post-op intubation/ventilation management.  History of Present Illness:  31 yoF initially presented to the ED 02/10/20 due to one week of headache, N/V and diarrhea. She was discharged after successful PO challenge. She returned to her mother's house when her mother states she was not behaving normally, rubbing the comforter excessively, turing the light switch on and off repetitively and not verbally response so she returned to the ED 1/19. At that time a CT head revealed small volume subarachnoid hemorrhage and hyperdense dural sinuses with a filling defect concerning for dural venous thrombosis. 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 Neurology was consulted for further management.  Past Medical History:  Asthma Sickle cell trait DM  Significant Hospital Events:  Admitted 1/20  Consults:  PCCM IR  Procedures:  EEG 1/20- This study is suggestive of moderate diffuse encephalopathy, nonspecific etiology.  At 1531, patient was noted to have right upper extremity jerking.  Concomitant EEG showed high amplitude 2 to 3 Hz rhythmic delta slowing in bitemporal region with evolution in morphology and amplitude but without definite evolution in frequency concerning for focal motor seizure.  Significant Diagnostic Tests:   CT-scan of the brain IMPRESSION: 1. The study is positive for a small volume of subarachnoid hemorrhage. There is no midline shift. 2. Hyperdense dural sinuses and cortical veins especially on the right in addition to a filling defect within the transverse sinus  on the right is concerning for dural venous thrombosis and may be the source of the patient's subarachnoid hemorrhage. Emergent MRI/MRV is recommended for further evaluation.  MRI/MR venogram examination of the brain IMPRESSION: 1. Extensive, acute dural venous sinus thrombosis involving the superior sagittal sinus into the right internal jugular vein via the dominant right transverse and sigmoid system. Both veins of [place] are thrombosed and there is straight sinus/deep cerebral thrombosis as well. 2. Deep watershed white matter infarcts, small volume subarachnoid hemorrhage, and left more than right frontoparietal cortical swelling due to #1.  Micro Data:    Antimicrobials:    Interim History / Subjective:   Patient resting comfortably in bed, somnolent, unable to follow commands.   Objective   Blood pressure (!) 101/55, pulse 94, temperature 99.5 F (37.5 C), temperature source Axillary, resp. rate (!) 22, height 5\' 5"  (1.651 m), weight 68.4 kg, last menstrual period 02/03/2020, SpO2 97 %.        Intake/Output Summary (Last 24 hours) at 02/13/2020 1201 Last data filed at 02/13/2020 1100 Gross per 24 hour  Intake 2726.36 ml  Output 250 ml  Net 2476.36 ml   Filed Weights   02/13/20 1101  Weight: 68.4 kg    Examination: General: laying in bed comfortably, NAD Lungs: CTA bilaterally, normal effort Cardiovascular: RRR, no murmurs Abdomen: BS+, soft, nondistended, nontender Extremities: warm and dry, no LE edema Neuro: unable to follow commands, somnolent   Resolved Hospital Problem list     Assessment & Plan:   34yoF presented with AMS after one week of headache, n/v and diarrhea. CT head and MRI brain demonstrated small volume SAH and extensive dural venous sinus thrombosis.   [place]  Extensive dural venous thrombosis  Thought to be due to dehydration from GI illness and subsequently leading to congestion and causing SAH. Patient does have a history of sickle cell  trait; hypercoagulable panel pending. On heparin gtt. IR to attempt aspiration/thrombectomy procedure today. PCCM consulted for possible intubation/vent management post-op. Will follow along.  Best practice (evaluated daily)  Diet: npo Pain/Anxiety/Delirium protocol (if indicated): n/a VAP protocol (if indicated): n/a DVT prophylaxis: heparin GI prophylaxis: protonix Glucose control: SSI Mobility: bedrest Disposition:pending clinical improvement  Goals of Care:  Last date of multidisciplinary goals of care discussion: on admission Family and staff present: Brother at bedside Summary of discussion: full scope of care Follow up goals of care discussion due: n/a Code Status: Full code  Labs   CBC: Recent Labs  Lab 02/10/20 2110 02/12/20 0127 02/13/20 0333  WBC 11.7* 13.6* 13.3*  HGB 13.6 12.6 11.0*  HCT 41.2 37.0 34.1*  MCV 88.2 88.1 90.5  PLT 634* 578* 564*    Basic Metabolic Panel: Recent Labs  Lab 02/10/20 2110 02/12/20 0127  NA 135 137  K 2.9* 3.5  CL 95* 103  CO2 18* 20*  GLUCOSE 150* 168*  BUN 12 9  CREATININE 0.82 0.61  CALCIUM 9.4 9.3   GFR: Estimated Creatinine Clearance: 95.4 mL/min (by C-G formula based on SCr of 0.61 mg/dL). Recent Labs  Lab 02/10/20 2110 02/12/20 0127 02/13/20 0333  WBC 11.7* 13.6* 13.3*    Liver Function Tests: Recent Labs  Lab 02/10/20 2110 02/12/20 0127  AST 32 16  ALT 42 30  ALKPHOS 58 52  BILITOT 1.6* 1.0  PROT 8.5* 8.3*  ALBUMIN 3.9 3.6   Recent Labs  Lab 02/10/20 2110  LIPASE 18   No results for input(s): AMMONIA in the last 168 hours.  ABG    Component Value Date/Time   HCO3 27.6 (H) 02/25/2007 2233   TCO2 29 02/25/2007 2233     Coagulation Profile: Recent Labs  Lab 02/12/20 0417  INR 1.2    Cardiac Enzymes: No results for input(s): CKTOTAL, CKMB, CKMBINDEX, TROPONINI in the last 168 hours.  HbA1C: Hgb A1c MFr Bld  Date/Time Value Ref Range Status  02/13/2020 03:33 AM 4.9 4.8 - 5.6 %  Final    Comment:    (NOTE) Pre diabetes:          5.7%-6.4%  Diabetes:              >6.4%  Glycemic control for   <7.0% adults with diabetes   02/12/2020 01:27 AM 4.9 4.8 - 5.6 % Final    Comment:    (NOTE) Pre diabetes:          5.7%-6.4%  Diabetes:              >6.4%  Glycemic control for   <7.0% adults with diabetes     CBG: Recent Labs  Lab 02/12/20 2006 02/12/20 2340 02/13/20 0333 02/13/20 0829 02/13/20 1032  GLUCAP 110* 115* 111* 100* 91    Review of Systems:   Unable to obtain due to patient's mental status.   Past Medical History:  She,  has a past medical history of Asthma, Diabetes mellitus without complication ([place]), Sickle cell trait ([place]), and Unspecified high-risk pregnancy (01/27/2011).   Surgical History:   Past Surgical History:  Procedure Laterality Date  . TONSILLECTOMY    . WISDOM TOOTH EXTRACTION       Social History:   reports that she has quit smoking. She has never used  smokeless tobacco. She reports that she does not drink alcohol and does not use drugs.   Family History:  Her family history includes Asthma in her father; Depression in her mother; Diabetes in her maternal grandmother; Drug abuse in her father; Kidney disease in her father; Sickle cell trait in her mother.   Allergies No Known Allergies   Home Medications  Prior to Admission medications   Not on [place], DO  PGY-2 IM

## 2020-02-13 NOTE — Anesthesia Procedure Notes (Signed)
Procedure Name: Intubation Date/Time: 02/13/2020 6:21 PM Performed by: Dorthea Cove, CRNA Pre-anesthesia Checklist: Patient identified, Emergency Drugs available, Suction available and Patient being monitored Patient Re-evaluated:Patient Re-evaluated prior to induction Oxygen Delivery Method: Circle system utilized Preoxygenation: Pre-oxygenation with 100% oxygen Induction Type: IV induction Ventilation: Mask ventilation without difficulty Laryngoscope Size: Mac and 3 Grade View: Grade I Tube type: Oral Tube size: 7.0 mm Number of attempts: 1 Airway Equipment and Method: Stylet and Oral airway Placement Confirmation: ETT inserted through vocal cords under direct vision,  positive ETCO2 and breath sounds checked- equal and bilateral Secured at: 21 cm Tube secured with: Tape Dental Injury: Teeth and Oropharynx as per pre-operative assessment

## 2020-02-13 NOTE — Progress Notes (Addendum)
ANTICOAGULATION CONSULT NOTE - Initial Consult  Pharmacy Consult for heparin Indication: dural venous sinus thrombosis now s/p thrombectomy  No Known Allergies  Patient Measurements: Height: 5\' 5"  (165.1 cm) Weight: 68.4 kg (150 lb 12.7 oz) IBW/kg (Calculated) : 57  Vital Signs: Temp: 99.6 F (37.6 C) (01/21 1500) Temp Source: Axillary (01/21 1500) BP: 107/73 (01/21 1500) Pulse Rate: 93 (01/21 1500)  Labs: Recent Labs    02/12/20 0127 02/12/20 0417 02/12/20 1600 02/13/20 0333 02/13/20 1008 02/13/20 2058  HGB 12.6  --   --  11.0*  --  7.5*  HCT 37.0  --   --  34.1*  --  22.0*  PLT 578*  --   --  564*  --   --   LABPROT  --  14.7  --   --   --   --   INR  --  1.2  --   --   --   --   HEPARINUNFRC  --   --  <0.10* 0.48 0.36  --   CREATININE 0.61  --   --   --   --  <0.20*    Medical History: Past Medical History:  Diagnosis Date  . Asthma    uses inhaler prn  . Diabetes mellitus without complication ([place])   . Sickle cell trait ([place])   . Unspecified high-risk pregnancy 01/27/2011    Assessment: 35yo female had been started on UFH for dural venous sinus thrombosis, paused today for IR procedure, now s/p thrombectomy with arterial sheath and venous sheath in place, to resume heparin. Was previously therapeutic at 1000 units/hr.  Goal of Therapy:  Heparin level 0.1-0.25 units/ml while sheaths in place Monitor platelets by anticoagulation protocol: Yes   Plan:  Will resume heparin at lower rate of 750 units/hr and monitor heparin levels and CBC; f/u when able to resume heparin at previous goal.  Wynona Neat, PharmD, BCPS  02/13/2020,11:41 PM   Addendum: Heparin level 1.52 after running just 5h; RN states she pulled labs from [facility] (UFH infusing in [facility] PIV).  This is surprising given heparin level was 0.36 running at 1000 units/hr.  Will hold heparin gtt x1h then resume at reduced rate of 400 units/hr and check level in 6hr.  VB 02/14/2020 6:04 AM

## 2020-02-13 NOTE — Progress Notes (Signed)
Chief Complaint: Patient was seen in consultation today for  Chief Complaint  Patient presents with  . Altered Mental Status    Referring Physician(s): *Dr. Leonel Ramsay Dr. [name]  Supervising Physician: Luanne Bras  Patient Status: [facility] - In-pt  History of Present Illness: Jill Clarke is a 35 y.o. female admitted with altered mental status. Brother states came to hospital a few days ago and was dehydrated, given IVF and released. That same night, she began having altered mental status and extremity weakness. She was brought back to the hospital last pm and found to have small SAH as well as dural sinus venous thrombosis. The pt was admitted with aggressive hydration and IV heparin was started. The pt is not making any clinical progress. NIR has been consulted for consideration of aspiration thrombectomy. Pt seen with Dr. Estanislado Pandy. Brother arrived at bedside during consult. PMHx, meds, labs, imaging, allergies reviewed.    Past Medical History:  Diagnosis Date  . Asthma    uses inhaler prn  . Diabetes mellitus without complication ([place])   . Sickle cell trait ([place])   . Unspecified high-risk pregnancy 01/27/2011    Past Surgical History:  Procedure Laterality Date  . TONSILLECTOMY    . WISDOM TOOTH EXTRACTION      Allergies: Patient has no known allergies.  Medications:  Current Facility-Administered Medications:  .   stroke: mapping our early stages of recovery book, , Does not apply, Once, Roland Rack P, MD .  0.9 %  sodium chloride infusion, , Intravenous, Continuous, Garvin Fila, MD, Last Rate: 120 mL/hr at 02/13/20 0925, Rate Change at 02/13/20 0925 .  acetaminophen (TYLENOL) tablet 650 mg, 650 mg, Oral, Q4H PRN **OR** acetaminophen (TYLENOL) 160 MG/5ML solution 650 mg, 650 mg, Per Tube, Q4H PRN **OR** acetaminophen (TYLENOL) suppository 650 mg, 650 mg, Rectal, Q4H PRN, Greta Doom, MD .  aspirin suppository 300 mg, 300  mg, Rectal, Daily, 300 mg at 02/12/20 0959 **OR** aspirin tablet 325 mg, 325 mg, Oral, Daily, Greta Doom, MD .  Chlorhexidine Gluconate Cloth 2 % PADS 6 each, 6 each, Topical, Daily, Greta Doom, MD, 6 each at 02/12/20 2014 .  heparin ADULT infusion 100 units/mL (25000 units/210mL), 1,000 Units/hr, Intravenous, Continuous, von Dohlen, Haley B, RPH, Last Rate: 10 mL/hr at 02/13/20 0600, 1,000 Units/hr at 02/13/20 0600 .  insulin aspart (novoLOG) injection 0-15 Units, 0-15 Units, Subcutaneous, Q4H, Kirkpatrick, Vida Roller, MD .  levETIRAcetam (KEPPRA) IVPB 1000 mg/100 mL premix, 1,000 mg, Intravenous, Q12H, Greta Doom, MD, Last Rate: 400 mL/hr at 02/13/20 0600, Infusion Verify at 02/13/20 0600 .  pantoprazole (PROTONIX) injection 40 mg, 40 mg, Intravenous, QHS, Sethi, Pramod S, MD .  sodium chloride 0.9 % bolus 500 mL, 500 mL, Intravenous, Once, Deveshwar, Sanjeev, MD    Family History  Problem Relation Age of Onset  . Sickle cell trait Mother   . Depression Mother   . Asthma Father   . Drug abuse Father        heroin abuse  . Kidney disease Father        due to heroin abuse  . Diabetes Maternal Grandmother     Social History   Socioeconomic History  . Marital status: Divorced    Spouse name: Not on file  . Number of children: Not on file  . Years of education: Not on file  . Highest education level: Not on file  Occupational History  . Not on file  Tobacco Use  .  Smoking status: Former Research scientist (life sciences)  . Smokeless tobacco: Never Used  Substance and Sexual Activity  . Alcohol use: No  . Drug use: No  . Sexual activity: Yes    Birth control/protection: Implant  Other Topics Concern  . Not on file  Social History Narrative  . Not on file   Social Determinants of Health   Financial Resource Strain: Not on file  Food Insecurity: Not on file  Transportation Needs: Not on file  Physical Activity: Not on file  Stress: Not on file  Social Connections:  Not on file    Review of Systems: A 12 point ROS discussed and pertinent positives are indicated in the HPI above.  All other systems are negative.  Review of Systems  Vital Signs: BP (!) 101/55   Pulse 94   Temp 99.5 F (37.5 C) (Axillary)   Resp (!) 22   LMP 02/03/2020 (Approximate)   SpO2 97%   Physical Exam Difficult to arouse, does respond and withdraw to stimuli. Does not follow commands. Pupils 2 mm, does not track +reflexes Lungs: CTA Heart: Regualr   Imaging: CT Head Wo Contrast  Result Date: 02/12/2020 CLINICAL DATA:  Mental status change. EXAM: CT HEAD WITHOUT CONTRAST TECHNIQUE: Contiguous axial images were obtained from the base of the skull through the vertex without intravenous contrast. COMPARISON:  None. FINDINGS: Brain: There is a small volume of subarachnoid hemorrhage bilaterally, most evident in the right frontal parietal region (axial series 3, image 14). No midline shift. The ventricular system is unremarkable.There is a small volume of extra-axial hemorrhage in the basal cisterns. The gray-white differentiation is unremarkable. The cortical veins appear hyperdense and somewhat enlarged. There is a questionable thrombus within the right transverse sinus. Vascular: There is no hyperdense arterial structure. Skull: The calvarium is unremarkable. The skull base is unremarkable. The visualized upper cervical spine is unremarkable. Sinuses/Orbits: The visualized orbits are unremarkable. The paranasal sinuses are unremarkable. The mastoid air cells are clear. Other: The visualized parotid gland is unremarkable. There is no scalp soft tissue swelling. IMPRESSION: 1. The study is positive for a small volume of subarachnoid hemorrhage. There is no midline shift. 2. Hyperdense dural sinuses and cortical veins especially on the right in addition to a filling defect within the transverse sinus on the right is concerning for dural venous thrombosis and may be the source of the  patient's subarachnoid hemorrhage. Emergent MRI/MRV is recommended for further evaluation. These results were called by telephone at the time of interpretation on 02/12/2020 at 4:10 am to provider Veryl Speak , who verbally acknowledged these results. Electronically Signed   By: Constance Holster M.D.   On: 02/12/2020 04:13   MR BRAIN WO CONTRAST  Result Date: 02/12/2020 CLINICAL DATA:  Mental status change with unknown cause. EXAM: MRI HEAD WITHOUT CONTRAST MRV HEAD WITHOUT CONTRAST TECHNIQUE: Multiplanar, multiecho pulse sequences of the brain and surrounding structures were obtained without intravenous contrast. Angiographic images of the intracranial venous structures were obtained using MRV technique without intravenous contrast. COMPARISON:  Head CT from earlier today FINDINGS: MRI HEAD WITHOUT CONTRAST Brain: Small patchy acute infarcts in the bilateral cerebral white matter and along the high left sylvian fissure where there is likely accentuation by susceptibility artifact. These are in a deep watershed pattern. Limited sulci, even for age which likely reflects diffuse swelling. Focal swelling is seen to a greater extent than the infarction along the high perirolandic cortex, left more than right. Small volume subarachnoid hemorrhage as seen on head  CT, mainly at the areas of greatest swelling. There is marked intravascular susceptibility signal involving the right more than left cortical, dural sinus, and deep veins due to intravascular deoxygenation/slow flow. No hydrocephalus. Vascular: Dural venous sinus thrombosis with further description below. Skull and upper cervical spine: No focal marrow signal abnormality Sinuses/Orbits: Negative MR VENOGRAM WITHOUT CONTRAST Abrupt occlusion of the mid superior sagittal sinus with confluent thrombus extending to the torcula, right transverse, right sigmoid, and right upper internal jugular vein. Bilateral vein of Trolard thrombosis. The straight sinus and  vein of Galen are also non flowing. No internal cerebral flow on either side. Critical Value/emergent results were called by telephone at the time of interpretation on 02/12/2020 at 6:49 am to provider Veryl Speak , who verbally acknowledged these results. IMPRESSION: 1. Extensive, acute dural venous sinus thrombosis involving the superior sagittal sinus into the right internal jugular vein via the dominant right transverse and sigmoid system. Both veins of Trolard are thrombosed and there is straight sinus/deep cerebral thrombosis as well. 2. Deep watershed white matter infarcts, small volume subarachnoid hemorrhage, and left more than right frontoparietal cortical swelling due to #1. Electronically Signed   By: Monte Fantasia M.D.   On: 02/12/2020 06:50   MR Venogram Head  Result Date: 02/12/2020 CLINICAL DATA:  Mental status change with unknown cause. EXAM: MRI HEAD WITHOUT CONTRAST MRV HEAD WITHOUT CONTRAST TECHNIQUE: Multiplanar, multiecho pulse sequences of the brain and surrounding structures were obtained without intravenous contrast. Angiographic images of the intracranial venous structures were obtained using MRV technique without intravenous contrast. COMPARISON:  Head CT from earlier today FINDINGS: MRI HEAD WITHOUT CONTRAST Brain: Small patchy acute infarcts in the bilateral cerebral white matter and along the high left sylvian fissure where there is likely accentuation by susceptibility artifact. These are in a deep watershed pattern. Limited sulci, even for age which likely reflects diffuse swelling. Focal swelling is seen to a greater extent than the infarction along the high perirolandic cortex, left more than right. Small volume subarachnoid hemorrhage as seen on head CT, mainly at the areas of greatest swelling. There is marked intravascular susceptibility signal involving the right more than left cortical, dural sinus, and deep veins due to intravascular deoxygenation/slow flow. No  hydrocephalus. Vascular: Dural venous sinus thrombosis with further description below. Skull and upper cervical spine: No focal marrow signal abnormality Sinuses/Orbits: Negative MR VENOGRAM WITHOUT CONTRAST Abrupt occlusion of the mid superior sagittal sinus with confluent thrombus extending to the torcula, right transverse, right sigmoid, and right upper internal jugular vein. Bilateral vein of Trolard thrombosis. The straight sinus and vein of Galen are also non flowing. No internal cerebral flow on either side. Critical Value/emergent results were called by telephone at the time of interpretation on 02/12/2020 at 6:49 am to provider Veryl Speak , who verbally acknowledged these results. IMPRESSION: 1. Extensive, acute dural venous sinus thrombosis involving the superior sagittal sinus into the right internal jugular vein via the dominant right transverse and sigmoid system. Both veins of Trolard are thrombosed and there is straight sinus/deep cerebral thrombosis as well. 2. Deep watershed white matter infarcts, small volume subarachnoid hemorrhage, and left more than right frontoparietal cortical swelling due to #1. Electronically Signed   By: Monte Fantasia M.D.   On: 02/12/2020 06:50   EEG adult  Result Date: 02/12/2020 Jill Havens, MD     02/12/2020  4:06 PM Patient Name: Jill Clarke MRN: [id number] Epilepsy Attending: Lora Clarke Referring Physician/Provider: Dr. Kathrynn Speed  Date: 02/12/2020 Duration: 25.25 minutes Patient history: 35 year old female with altered mental status.  EEG to evaluate for seizures. Level of alertness: Awake, asleep AEDs during EEG study: None Technical aspects: This EEG study was done with scalp electrodes positioned according to the 10-20 International system of electrode placement. Electrical activity was acquired at a sampling rate of 500Hz  and reviewed with a high frequency filter of 70Hz  and a low frequency filter of 1Hz . EEG data were recorded  continuously and digitally stored. Description: Np posterior dominant rhythm was seen. Sleep was characterized by sleep spindles (12 to 14 Hz), maximal frontocentral region.  EEG showed continuous generalized 3 to 5 Hz theta and delta slowing.  Hyperventilation and photic stimulation were not performed. At 1531, patient was noted to move her right arm towards her chest followed by slow but rhythmic jerking of right upper extremity.  Concomitant EEG showed high amplitude 2 to 3 Hz rhythmic delta slowing in bitemporal region which gradually increases in amplitude and appears more sharply contoured without definite evolution in frequency. ABNORMALITY -Continue slow, generalized IMPRESSION: This study is suggestive of moderate diffuse encephalopathy, nonspecific etiology.  At 1531, patient was noted to have right upper extremity jerking.  Concomitant EEG showed high amplitude 2 to 3 Hz rhythmic delta slowing in bitemporal region with evolution in morphology and amplitude but without definite evolution in frequency concerning for focal motor seizure. Dr. Leonel Ramsay was notified. Jill Clarke    Labs:  CBC: Recent Labs    02/10/20 2110 02/12/20 0127 02/13/20 0333  WBC 11.7* 13.6* 13.3*  HGB 13.6 12.6 11.0*  HCT 41.2 37.0 34.1*  PLT 634* 578* 564*    COAGS: Recent Labs    02/12/20 0417  INR 1.2    BMP: Recent Labs    02/10/20 2110 02/12/20 0127  NA 135 137  K 2.9* 3.5  CL 95* 103  CO2 18* 20*  GLUCOSE 150* 168*  BUN 12 9  CALCIUM 9.4 9.3  CREATININE 0.82 0.61  GFRNONAA >60 >60    LIVER FUNCTION TESTS: Recent Labs    02/10/20 2110 02/12/20 0127  BILITOT 1.6* 1.0  AST 32 16  ALT 42 30  ALKPHOS 58 52  PROT 8.5* 8.3*  ALBUMIN 3.9 3.6    TUMOR MARKERS: No results for input(s): AFPTM, CEA, CA199, CHROMGRNA in the last 8760 hours.  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 Needs attempt at aspiration thrombectomy Brother at bedside and mother reached via telephone. Discussed that without attempt at treatment, expect poor prognosis. Explained role and goal of aspiration/thrombectomy procedure. Discussed procedure in detail including risks, complications of stroke, hemorrhage, or death. Risks and benefits of cerebral angio with venous thrombosis aspiration were discussed with the patient including, but not limited to bleeding, infection, vascular injury or contrast induced renal failure.  This interventional procedure involves the use of X-rays and because of the nature of the planned procedure, it is possible that we will have prolonged use of X-ray fluoroscopy.  Potential radiation risks to you include (but are not limited to) the following: - A slightly elevated risk for cancer  several years later in life. This risk is typically less than 0.5% percent. This risk is low in comparison to the normal incidence of human cancer, which is 33% for women and 50% for men according to the [place]. - Radiation induced injury can include skin redness, resembling a rash, tissue breakdown /  ulcers and hair loss (which can be temporary or permanent).   The likelihood of either of these occurring depends on the difficulty of the procedure and whether you are sensitive to radiation due to previous procedures, disease, or genetic conditions.   IF your procedure requires a prolonged use of radiation, you will be notified and given written instructions for further action.  It is your responsibility to monitor the irradiated area for the 2 weeks following the procedure and to notify your physician if you are concerned that you have suffered a radiation induced injury.    All of the patient's questions were answered, patient is agreeable to proceed.  Consent signed and in chart.     Thank you for this interesting consult.  I greatly enjoyed  meeting KAHLEAH GARFINKEL and look forward to participating in their care.  A copy of this report was sent to the requesting provider on this date.  Electronically Signed: Ascencion Dike, PA-C 02/13/2020, 10:18 AM   I spent a total of 40 minutes in face to face in clinical consultation, greater than 50% of which was counseling/coordinating care for dural venous sinus thrombosis.

## 2020-02-13 NOTE — Progress Notes (Signed)
NAME:  Jill Clarke, MRN:  [id number], DOB:  [birth year], LOS: 1 ADMISSION DATE:  02/12/2020, CONSULTATION DATE:  1/21 REFERRING MD:  deveshwar, CHIEF COMPLAINT:  Vent mgmt    Brief History:  35yo female with hx sickle cell, DM admitted 1/20 with AMS found to have small SAH as well as dural sinus venous thrombosis with deep watershed infarcts.  She had minimal clinical progress with IV hydration, 3% NaCl and heparin and was ultimately taken 1/21 evening to Neuro IR for aspiration thrombectomy. Pt remained intubated post procedure and PCCM consulted for vent mgmt.   History of Present Illness:  71 yoF initially presented to the ED 02/10/20 due to one week of headache, N/V and diarrhea. She was discharged after successful PO challenge. She returned to her mother's house when her mother states she was not behaving normally, rubbing the comforter excessively, turing the light switch on and off repetitively and not verbally response so she returned to the ED 1/19. At that time a CT head revealed small volume subarachnoid hemorrhage and hyperdense dural sinuses with a filling defect concerning for dural venous thrombosis. 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 Neurology was consulted for further management.  Past Medical History:   has a past medical history of Asthma, Diabetes mellitus without complication ([place]), Sickle cell trait ([place]), and Unspecified high-risk pregnancy (01/27/2011).   Significant Hospital Events:    Consults:    Procedures:  1/21>> Neuro IR - dural venous sinus thrombectomy  EEG 1/20>>> This studyis suggestive of moderate diffuse encephalopathy, nonspecific etiology. At 1531, patient was noted to have right upper extremity jerking. Concomitant EEG showed high amplitude 2 to 3 Hz rhythmic delta slowing inbitemporal region with evolution in morphology and amplitude but without definite evolution in  frequency concerning for focal motor seizure. ETT 1/21>>>  Significant Diagnostic Tests:  MRI brain 1/20>>>  1. Extensive, acute dural venous sinus thrombosis involving the superior sagittal sinus into the right internal jugular vein via the dominant right transverse and sigmoid system. Both veins of Trolard are thrombosed and there is straight sinus/deep cerebral thrombosis as well. 2. Deep watershed white matter infarcts, small volume subarachnoid hemorrhage, and left more than right frontoparietal cortical swelling due to #1.  Micro Data:  1/20 flu/covid>>> NEG   Antimicrobials:    Interim History / Subjective:  Seen in ICU post neuro IR procedure.  Remains sedated, vented.   Objective   Blood pressure 107/73, pulse 93, temperature 99.6 F (37.6 C), temperature source Axillary, resp. rate 20, height 5\' 5"  (1.651 m), weight 68.4 kg, last menstrual period 02/03/2020, SpO2 99 %.        Intake/Output Summary (Last 24 hours) at 02/13/2020 2316 Last data filed at 02/13/2020 2211 Gross per 24 hour  Intake 2392.25 ml  Output 1750 ml  Net 642.25 ml   Filed Weights   02/13/20 1101  Weight: 68.4 kg    Examination: General: young female, NAD sedated on vent  HENT: ETT, mm moist Lungs: resps even, non labored on vent, coarse R>L  Cardiovascular: s1s2 rrr  Abdomen: soft, +bs  Extremities: warm and dry, L arterial sheath, R venous sheath  Neuro: sedated post procedure, RASS -3, pupils 32mm sluggish, does not track or follow commands    Resolved Hospital Problem list     Assessment & Plan:   Acute extensive dural sinus thrombosis with associated watershed infarcts and small SAH - s/p neuro IR dural sinus thrombectomy  with improved blood flow  PLAN -  Per Neuro IR  BP management  Continue IV Heparin  Hypercoag panel pending   Acute respiratory failure - r/t AMS  Hx asthma  PLAN -  Vent support - 8cc/kg  F/u CXR  F/u ABG Daily SBT when mental status allows  VAP  prevention  PRN BD    Best practice (evaluated daily)  Diet: npo Pain/Anxiety/Delirium protocol (if indicated): n/a VAP protocol (if indicated): n/a DVT prophylaxis: heparin GI prophylaxis: protonix Glucose control: SSI Mobility: bedrest Disposition:pending clinical improvement  Goals of Care:  Last date of multidisciplinary goals of care discussion: on admission Family and staff present: Brother at bedside Summary of discussion: full scope of care Follow up goals of care discussion due: n/a Code Status: Full code   Labs   CBC: Recent Labs  Lab 02/10/20 2110 02/12/20 0127 02/13/20 0333 02/13/20 2058  WBC 11.7* 13.6* 13.3*  --   HGB 13.6 12.6 11.0* 7.5*  HCT 41.2 37.0 34.1* 22.0*  MCV 88.2 88.1 90.5  --   PLT 634* 578* 564*  --     Basic Metabolic Panel: Recent Labs  Lab 02/10/20 2110 02/12/20 0127 02/13/20 1521 02/13/20 2058  NA 135 137 146* 154*  K 2.9* 3.5  --  2.8*  CL 95* 103  --  118*  CO2 18* 20*  --   --   GLUCOSE 150* 168*  --  94  BUN 12 9  --  3*  CREATININE 0.82 0.61  --  <0.20*  CALCIUM 9.4 9.3  --   --    GFR: CrCl cannot be calculated (This lab value cannot be used to calculate CrCl because it is not a number: <0.20). Recent Labs  Lab 02/10/20 2110 02/12/20 0127 02/13/20 0333  WBC 11.7* 13.6* 13.3*    Liver Function Tests: Recent Labs  Lab 02/10/20 2110 02/12/20 0127  AST 32 16  ALT 42 30  ALKPHOS 58 52  BILITOT 1.6* 1.0  PROT 8.5* 8.3*  ALBUMIN 3.9 3.6   Recent Labs  Lab 02/10/20 2110  LIPASE 18   No results for input(s): AMMONIA in the last 168 hours.  ABG    Component Value Date/Time   HCO3 27.6 (H) 02/25/2007 2233   TCO2 22 02/13/2020 2058     Coagulation Profile: Recent Labs  Lab 02/12/20 0417  INR 1.2    Cardiac Enzymes: No results for input(s): CKTOTAL, CKMB, CKMBINDEX, TROPONINI in the last 168 hours.  HbA1C: Hgb A1c MFr Bld  Date/Time Value Ref Range Status  02/13/2020 03:33 AM 4.9 4.8 - 5.6  % Final    Comment:    (NOTE) Pre diabetes:          5.7%-6.4%  Diabetes:              >6.4%  Glycemic control for   <7.0% adults with diabetes   02/12/2020 01:27 AM 4.9 4.8 - 5.6 % Final    Comment:    (NOTE) Pre diabetes:          5.7%-6.4%  Diabetes:              >6.4%  Glycemic control for   <7.0% adults with diabetes     CBG: Recent Labs  Lab 02/12/20 2340 02/13/20 0333 02/13/20 0829 02/13/20 1032 02/13/20 1519  GLUCAP 115* 111* 100* 91 94    Critical care time: 36min   Katy Jeziel Hoffmann, NP Pulmonary/Critical Care Medicine  02/13/2020  11:16 PM

## 2020-02-13 NOTE — Progress Notes (Signed)
OT Cancellation Note  Patient Details Name: Jill Clarke MRN: [id number] DOB: 05/07/1985   Cancelled Treatment:    Reason Eval/Treat Not Completed: Medical issues which prohibited therapy (pending IR for revascularization today)  Jeri Modena 02/13/2020, 11:49 AM   Fleeta Emmer, OTR/L  Acute Rehabilitation Services Pager: [phone] Office: [phone] .

## 2020-02-13 NOTE — Progress Notes (Signed)
ANTICOAGULATION CONSULT NOTE  Pharmacy Consult for heparin Indication: Dural venous sinus thrombosis  No Known Allergies  Patient Measurements:   Heparin Dosing Weight: 68 kg  Vital Signs: Temp: 99.4 F (37.4 C) (01/21 0400) Temp Source: Axillary (01/21 0400) BP: 109/76 (01/21 0400) Pulse Rate: 113 (01/21 0400)  Labs: Recent Labs    02/10/20 2110 02/12/20 0127 02/12/20 0417 02/12/20 1600 02/13/20 0333  HGB 13.6 12.6  --   --  11.0*  HCT 41.2 37.0  --   --  34.1*  PLT 634* 578*  --   --  564*  LABPROT  --   --  14.7  --   --   INR  --   --  1.2  --   --   HEPARINUNFRC  --   --   --  <0.10* 0.48  CREATININE 0.82 0.61  --   --   --     Estimated Creatinine Clearance: 88.3 mL/min (by C-G formula based on SCr of 0.61 mg/dL).    Assessment: 69 YOF with altered mental status found to have a small volume subarachnoid hemorrhage and hyperdense dural sinuses concerning for dural venous thrombosis. Pharmacy consulted to start IV heparin per stroke protocol.   Heparin level therapeutic (0.48) on gtt at 1000 units/hr.   Goal of Therapy:  Heparin level 0.3-0.5 units/ml Monitor platelets by anticoagulation protocol: Yes   Plan:  Continue heparin at 1000 units/hr Check 6hr confirmatory heparin level  Sherlon Handing, PharmD, BCPS Please see amion for complete clinical pharmacist phone list 02/13/2020 5:29 AM

## 2020-02-13 NOTE — Procedures (Signed)
S/P bilateral common carotid and RT vertebral arteriograms.Bilateral TS venograms. RT CFV and Lt CFA approaches., Findings. 1.Extensive thrombus load in the TS bilaterally,the Rt SS ,RT IJV and post one third of the SSS. Large chunks of clot removed with revascularization of the TS bilaterally and partially the RT SS and the torcula. Sig decreased venous congestion of the cortical veins noted post procedure. Post CT brain No ICH or mass effect or hydrocephalus. Clinically stable with pupils 35mm sluggish. Plan . 1.Leave patient intubated with both sheaths left in place., 2.Re assess in am .May need to repeat treatment in am iif patient clinically not improved. May resume previous IV heparin orders.Marland Kitchen S.Sharena Dibenedetto MD

## 2020-02-14 ENCOUNTER — Inpatient Hospital Stay (HOSPITAL_COMMUNITY): Payer: 59

## 2020-02-14 DIAGNOSIS — G9340 Encephalopathy, unspecified: Secondary | ICD-10-CM | POA: Diagnosis not present

## 2020-02-14 LAB — POCT I-STAT 7, (LYTES, BLD GAS, ICA,H+H)
Acid-Base Excess: 1 mmol/L (ref 0.0–2.0)
Bicarbonate: 24.6 mmol/L (ref 20.0–28.0)
Calcium, Ion: 1.19 mmol/L (ref 1.15–1.40)
HCT: 24 % — ABNORMAL LOW (ref 36.0–46.0)
Hemoglobin: 8.2 g/dL — ABNORMAL LOW (ref 12.0–15.0)
O2 Saturation: 100 %
Patient temperature: 97.7
Potassium: 2.9 mmol/L — ABNORMAL LOW (ref 3.5–5.1)
Sodium: 156 mmol/L — ABNORMAL HIGH (ref 135–145)
TCO2: 26 mmol/L (ref 22–32)
pCO2 arterial: 32.4 mmHg (ref 32.0–48.0)
pH, Arterial: 7.485 — ABNORMAL HIGH (ref 7.350–7.450)
pO2, Arterial: 431 mmHg — ABNORMAL HIGH (ref 83.0–108.0)

## 2020-02-14 LAB — CBC WITH DIFFERENTIAL/PLATELET
Abs Immature Granulocytes: 0.86 10*3/uL — ABNORMAL HIGH (ref 0.00–0.07)
Basophils Absolute: 0.1 10*3/uL (ref 0.0–0.1)
Basophils Relative: 0 %
Eosinophils Absolute: 0.1 10*3/uL (ref 0.0–0.5)
Eosinophils Relative: 0 %
HCT: 27.7 % — ABNORMAL LOW (ref 36.0–46.0)
Hemoglobin: 8.6 g/dL — ABNORMAL LOW (ref 12.0–15.0)
Immature Granulocytes: 5 %
Lymphocytes Relative: 20 %
Lymphs Abs: 3.2 10*3/uL (ref 0.7–4.0)
MCH: 29.2 pg (ref 26.0–34.0)
MCHC: 31 g/dL (ref 30.0–36.0)
MCV: 93.9 fL (ref 80.0–100.0)
Monocytes Absolute: 2 10*3/uL — ABNORMAL HIGH (ref 0.1–1.0)
Monocytes Relative: 12 %
Neutro Abs: 9.8 10*3/uL — ABNORMAL HIGH (ref 1.7–7.7)
Neutrophils Relative %: 63 %
Platelets: 575 10*3/uL — ABNORMAL HIGH (ref 150–400)
RBC: 2.95 MIL/uL — ABNORMAL LOW (ref 3.87–5.11)
RDW: 14 % (ref 11.5–15.5)
WBC: 15.9 10*3/uL — ABNORMAL HIGH (ref 4.0–10.5)
nRBC: 0 % (ref 0.0–0.2)

## 2020-02-14 LAB — BASIC METABOLIC PANEL
Anion gap: 9 (ref 5–15)
Anion gap: 8 (ref 5–15)
BUN: <5 mg/dL — ABNORMAL LOW (ref 6–20)
BUN: <5 mg/dL — ABNORMAL LOW (ref 6–20)
CO2: 21 mmol/L — ABNORMAL LOW (ref 22–32)
CO2: 24 mmol/L (ref 22–32)
Calcium: 7.8 mg/dL — ABNORMAL LOW (ref 8.9–10.3)
Calcium: 8.1 mg/dL — ABNORMAL LOW (ref 8.9–10.3)
Chloride: 123 mmol/L — ABNORMAL HIGH (ref 98–111)
Chloride: 124 mmol/L — ABNORMAL HIGH (ref 98–111)
Creatinine, Ser: 0.48 mg/dL (ref 0.44–1.00)
Creatinine, Ser: 0.47 mg/dL (ref 0.44–1.00)
GFR, Estimated: >60 mL/min (ref >60)
GFR, Estimated: >60 mL/min (ref >60)
Glucose, Bld: 120 mg/dL — ABNORMAL HIGH (ref 70–99)
Glucose, Bld: 97 mg/dL (ref 70–99)
Potassium: 2.9 mmol/L — ABNORMAL LOW (ref 3.5–5.1)
Potassium: 3 mmol/L — ABNORMAL LOW (ref 3.5–5.1)
Sodium: 153 mmol/L — ABNORMAL HIGH (ref 135–145)
Sodium: 156 mmol/L — ABNORMAL HIGH (ref 135–145)

## 2020-02-14 LAB — BETA-2-GLYCOPROTEIN I ABS, IGG/M/A
Beta-2 Glyco I IgG: <9 GPI IgG units (ref 0–20)
Beta-2-Glycoprotein I IgA: <9 GPI IgA units (ref 0–25)
Beta-2-Glycoprotein I IgM: 11 GPI IgM units (ref 0–32)

## 2020-02-14 LAB — MAGNESIUM
Magnesium: 1.7 mg/dL (ref 1.7–2.4)
Magnesium: 1.8 mg/dL (ref 1.7–2.4)

## 2020-02-14 LAB — HEPARIN LEVEL (UNFRACTIONATED)
Heparin Unfractionated: 1.52 IU/mL — ABNORMAL HIGH (ref 0.30–0.70)
Heparin Unfractionated: 1.14 IU/mL — ABNORMAL HIGH (ref 0.30–0.70)
Heparin Unfractionated: 0.31 IU/mL (ref 0.30–0.70)
Heparin Unfractionated: 0.23 IU/mL — ABNORMAL LOW (ref 0.30–0.70)
Heparin Unfractionated: 0.18 IU/mL — ABNORMAL LOW (ref 0.30–0.70)

## 2020-02-14 LAB — GLUCOSE, CAPILLARY
Glucose-Capillary: 100 mg/dL — ABNORMAL HIGH (ref 70–99)
Glucose-Capillary: 124 mg/dL — ABNORMAL HIGH (ref 70–99)
Glucose-Capillary: 129 mg/dL — ABNORMAL HIGH (ref 70–99)
Glucose-Capillary: 81 mg/dL (ref 70–99)
Glucose-Capillary: 93 mg/dL (ref 70–99)
Glucose-Capillary: 96 mg/dL (ref 70–99)

## 2020-02-14 LAB — SODIUM
Sodium: 149 mmol/L — ABNORMAL HIGH (ref 135–145)
Sodium: 159 mmol/L — ABNORMAL HIGH (ref 135–145)
Sodium: 161 mmol/L (ref 135–145)

## 2020-02-14 LAB — SURGICAL PCR SCREEN
MRSA, PCR: NEGATIVE
Staphylococcus aureus: NEGATIVE

## 2020-02-14 LAB — TRIGLYCERIDES: Triglycerides: 72 mg/dL (ref <150)

## 2020-02-14 LAB — SARS CORONAVIRUS 2 (TAT 6-24 HRS): SARS Coronavirus 2: NEGATIVE

## 2020-02-14 LAB — PHOSPHORUS
Phosphorus: 2 mg/dL — ABNORMAL LOW (ref 2.5–4.6)
Phosphorus: 1.3 mg/dL — ABNORMAL LOW (ref 2.5–4.6)

## 2020-02-14 IMAGING — DX DG ABDOMEN 1V
1 series · 1 of 1 positions shown · non-contrast
Comparison: None.

CLINICAL DATA: OG tube placement.

EXAM:
ABDOMEN - 1 VIEW

[abdomen]
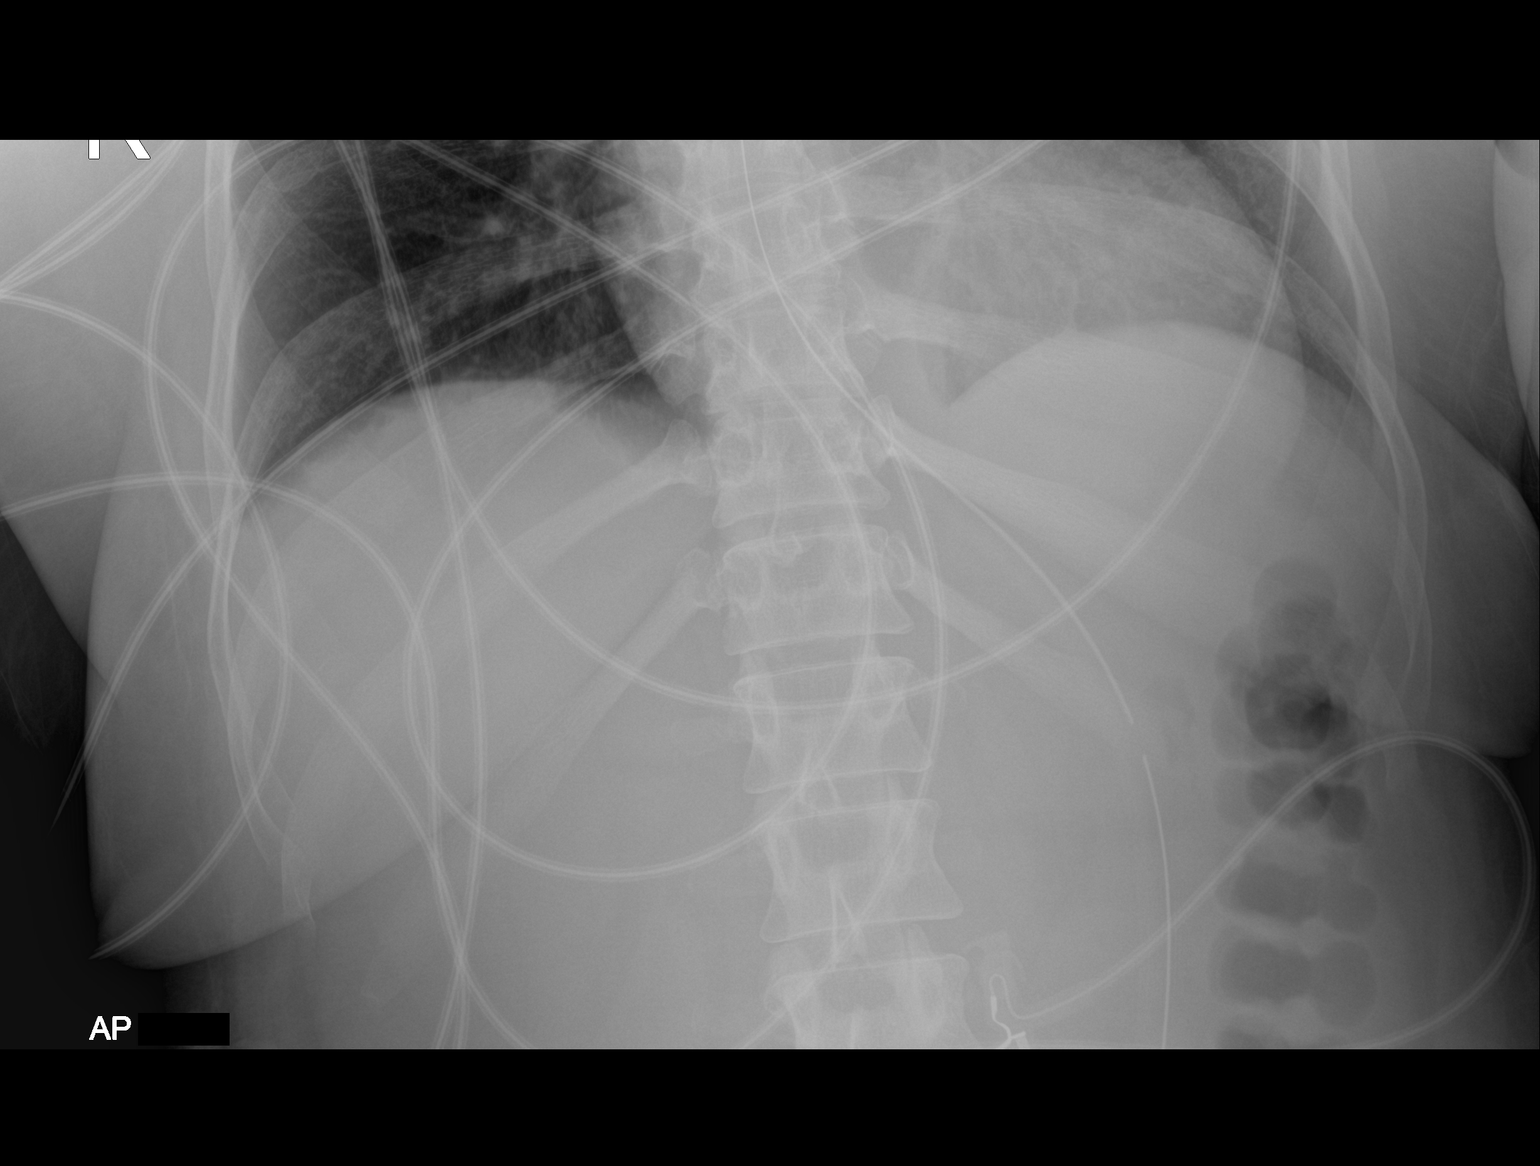

[1 of 1 positions shown; findings below may reference images not displayed]

FINDINGS: Enteric tube tip and side-port project over the stomach. Lung bases
are clear.
IMPRESSION: Enteric tube tip and side-port project over the stomach.

## 2020-02-14 IMAGING — DX DG CHEST 1V PORT
1 series · 1 of 1 positions shown · non-contrast
Comparison: One day prior

CLINICAL DATA: Endotracheal tube placement.

EXAM:
PORTABLE CHEST 1 VIEW

[chest]
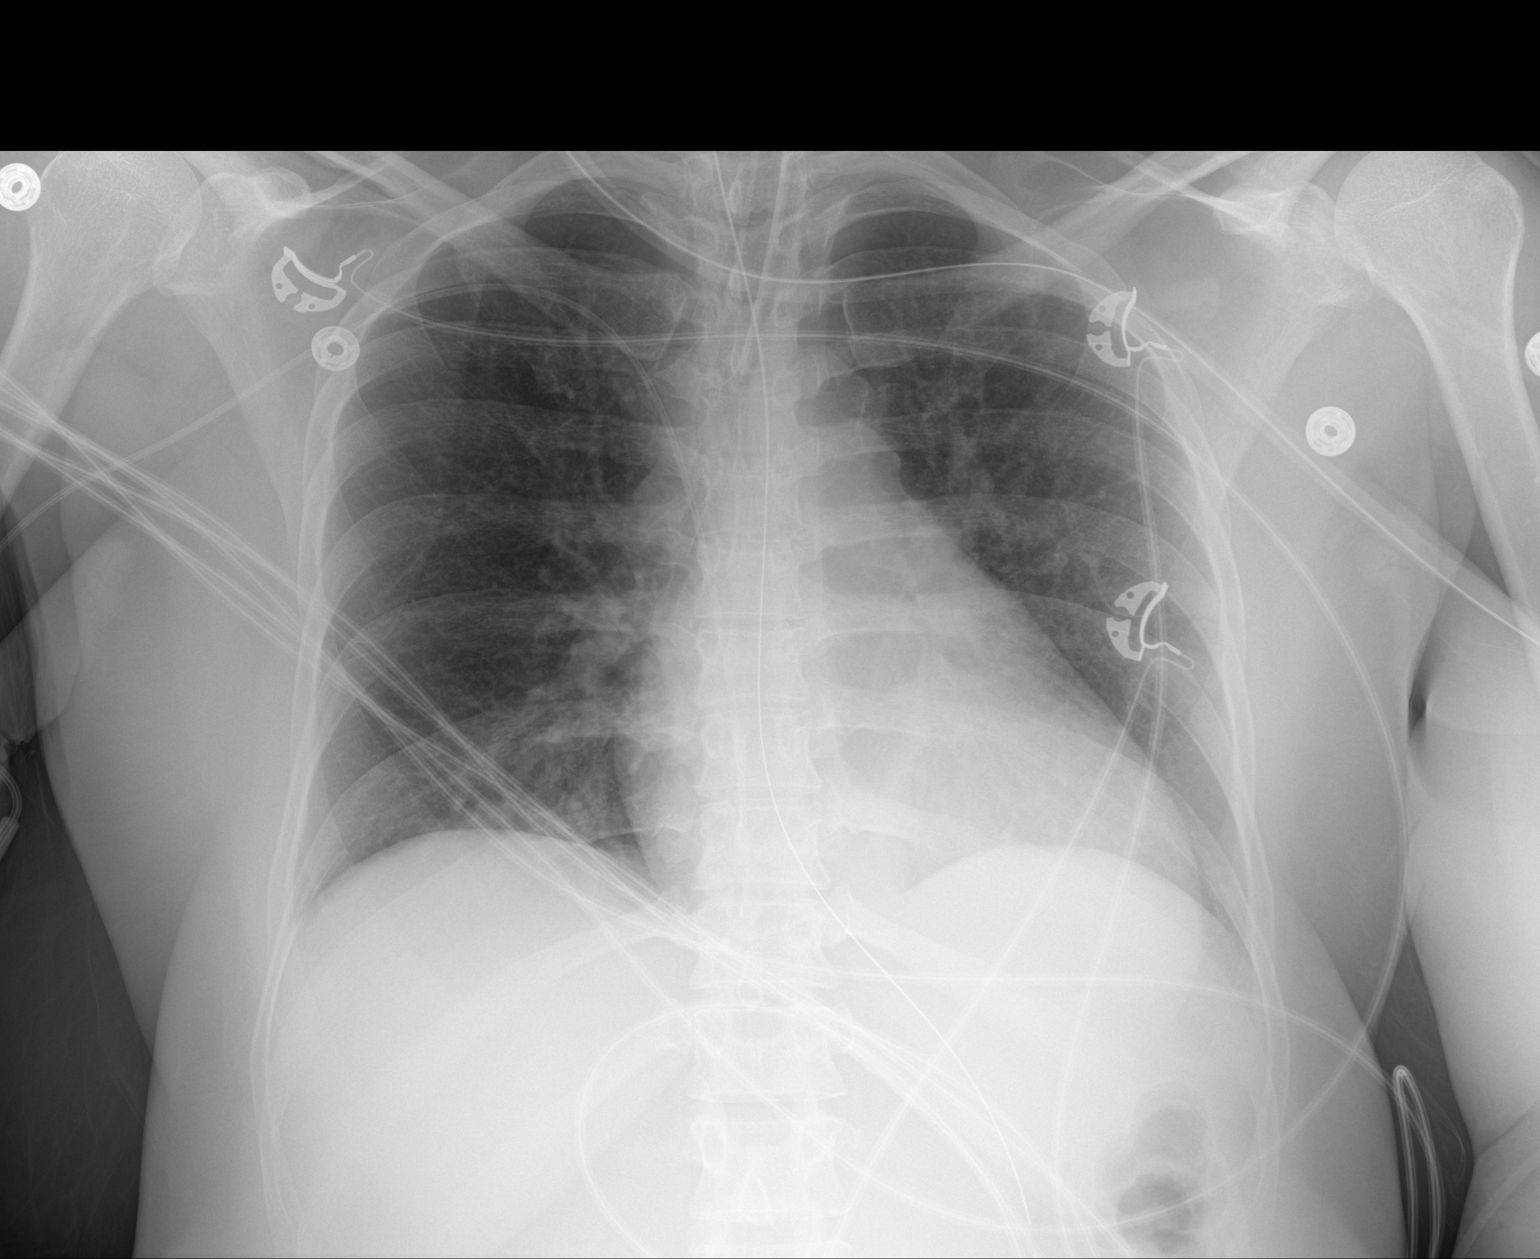

[1 of 1 positions shown; findings below may reference images not displayed]

FINDINGS: Endotracheal tube terminates 3.9 cm above carina. Nasogastric tube
extends beyond the inferior aspect of the film.

Right-sided PICC line is difficult to follow centrally. Most likely
with tip at cavoatrial junction. Midline trachea. Normal heart size.
No pleural effusion or pneumothorax. Improved left lower lobe
aeration with mild bibasilar atelectasis remaining.
IMPRESSION: Appropriate position of endotracheal tube.

Bibasilar atelectasis with significantly improved left lower lobe
aeration.

## 2020-02-14 IMAGING — CT CT HEAD W/O CM
4 series · 16 of 47 positions shown, 18 images · non-contrast
Comparison: [DATE].

CLINICAL DATA: Stroke, follow up

EXAM:
CT HEAD WITHOUT CONTRAST
TECHNIQUE: Contiguous axial images were obtained from the base of the skull
through the vertex without intravenous contrast.

[Series 3: head wo · axial · 0.45mm/px · z∈[-157,-37]mm · 7 of 34 slices shown, 9 images]
[im 5/34  brain]
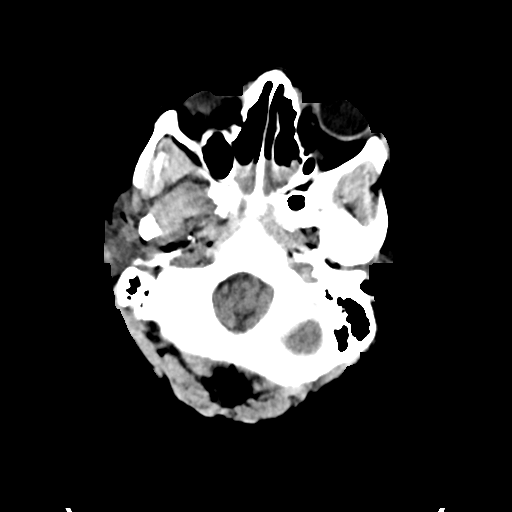
[im 5/34  bone]
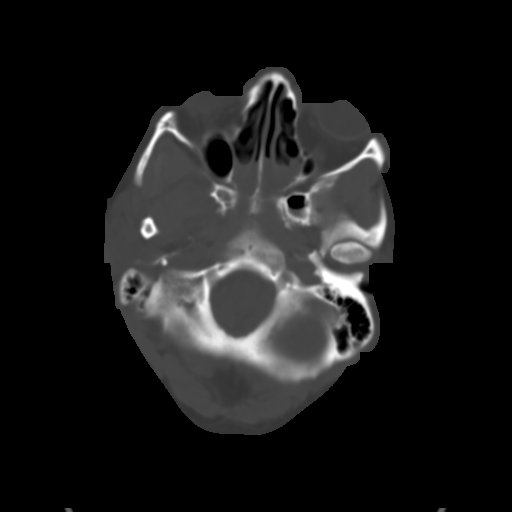
[im 9/34  brain]
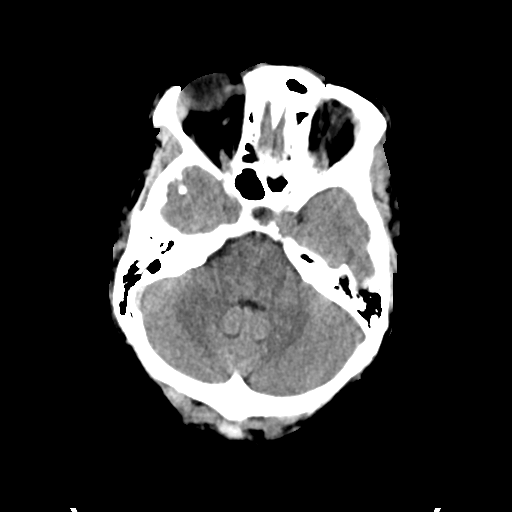
[im 13/34  brain]
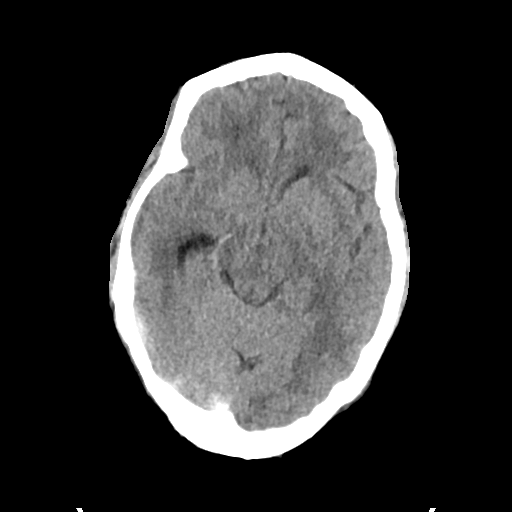
[im 17/34  brain]
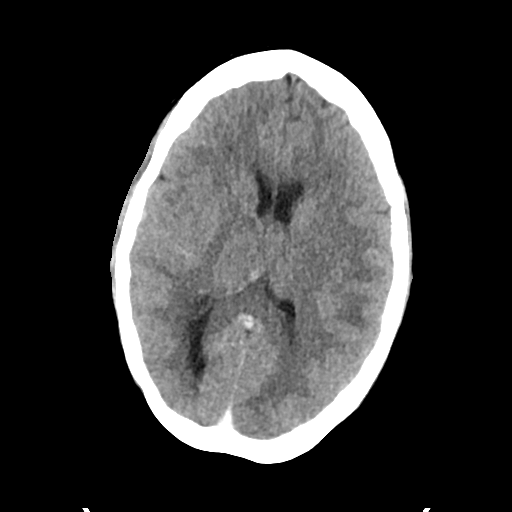
[im 21/34  brain]
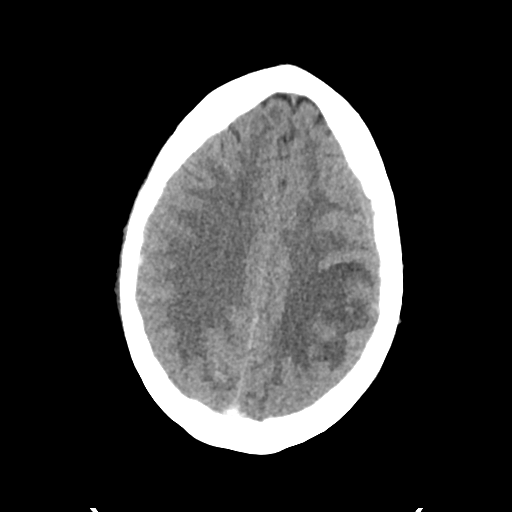
[im 21/34  bone]
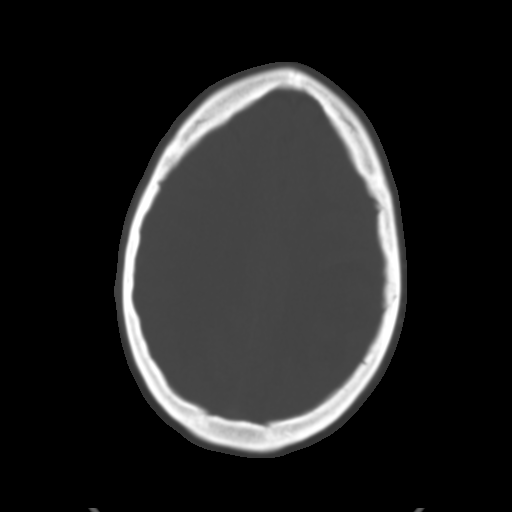
[im 25/34  brain]
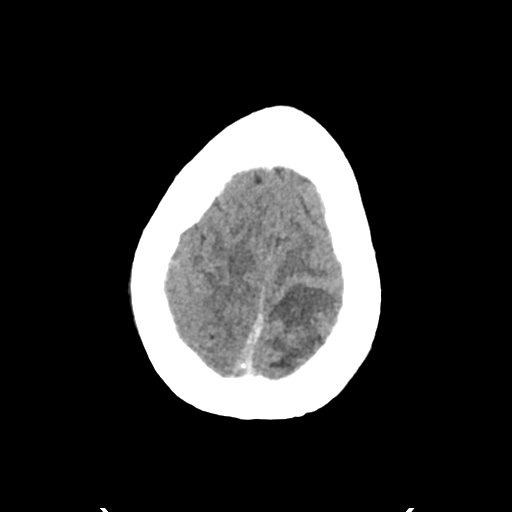
[im 29/34  brain]
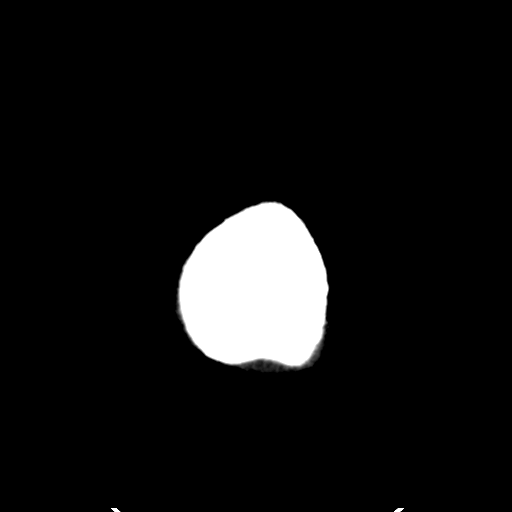

[Series 4: head bone · axial · 0.45mm/px · z∈[-161,-129]mm · 3 of 84 slices shown]
[im 9/84  bone]
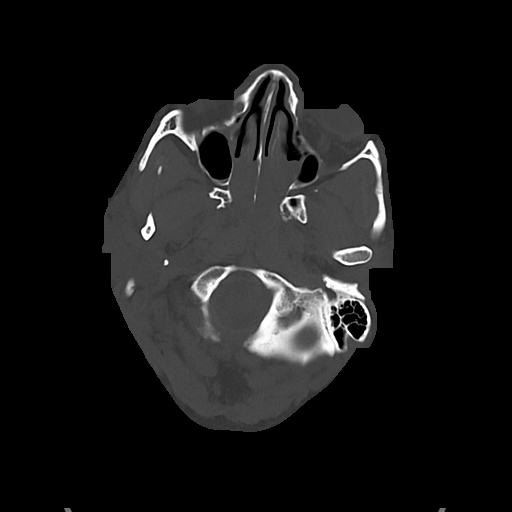
[im 17/84  bone]
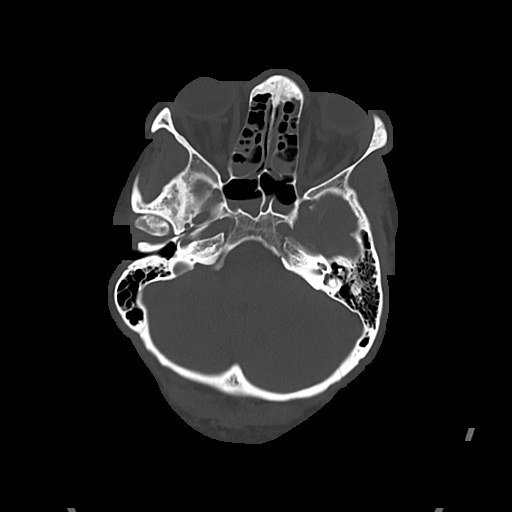
[im 25/84  bone]
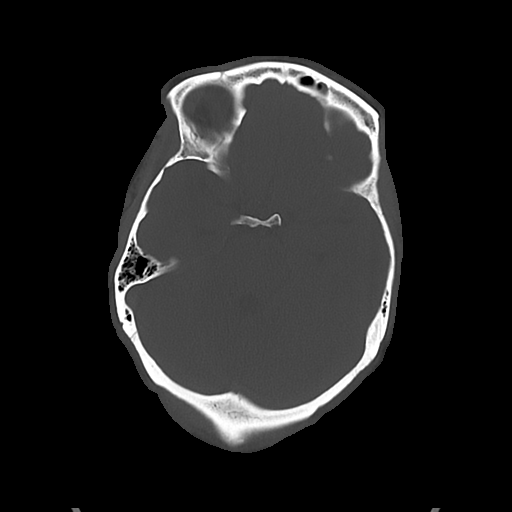

[Series 5: cor soft · coronal · 0.35mm/px · 3 of 76 slices shown]
[im 26/76  brain]
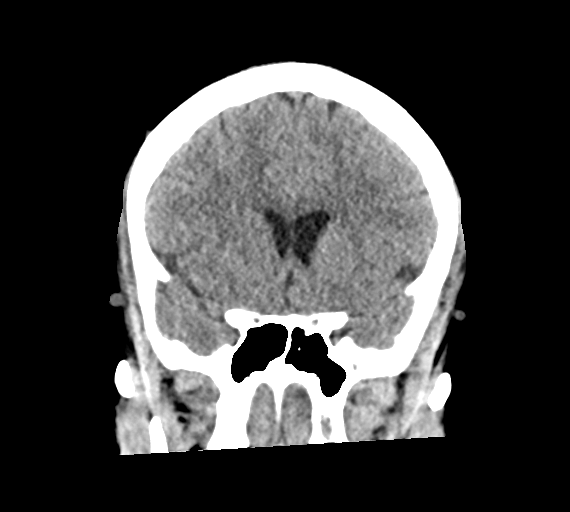
[im 34/76  brain]
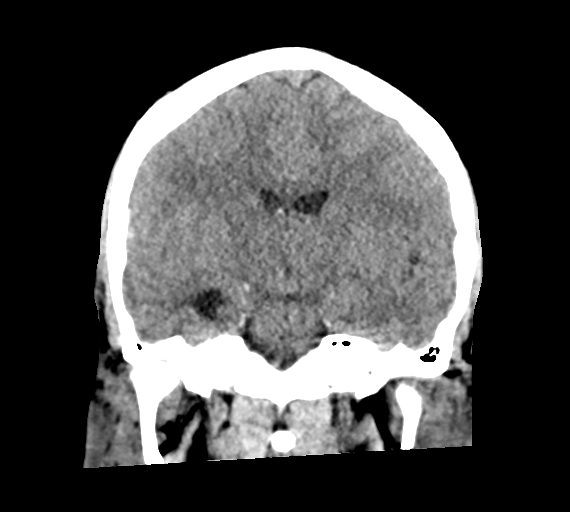
[im 42/76  brain]
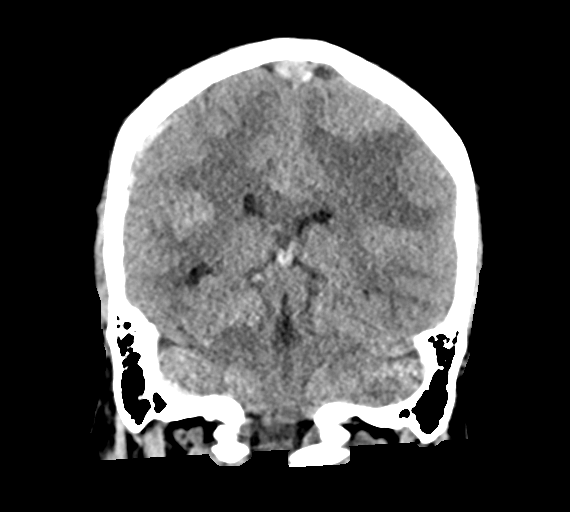

[Series 6: sag soft · sagittal · 0.34mm/px · 3 of 58 slices shown]
[im 20/58  brain]
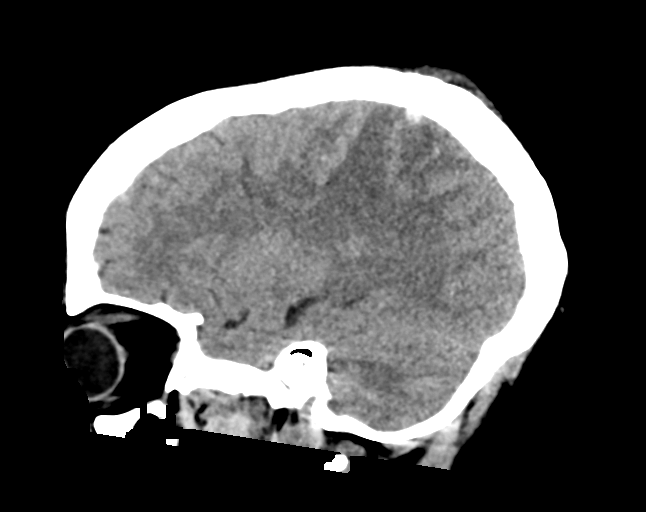
[im 29/58  brain]
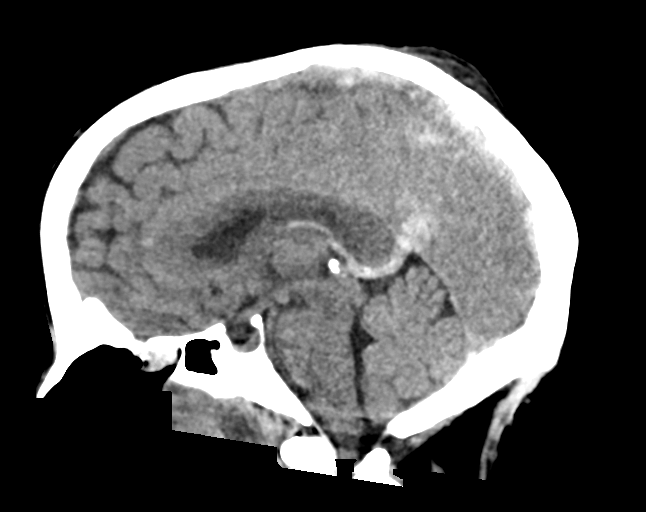
[im 39/58  brain]
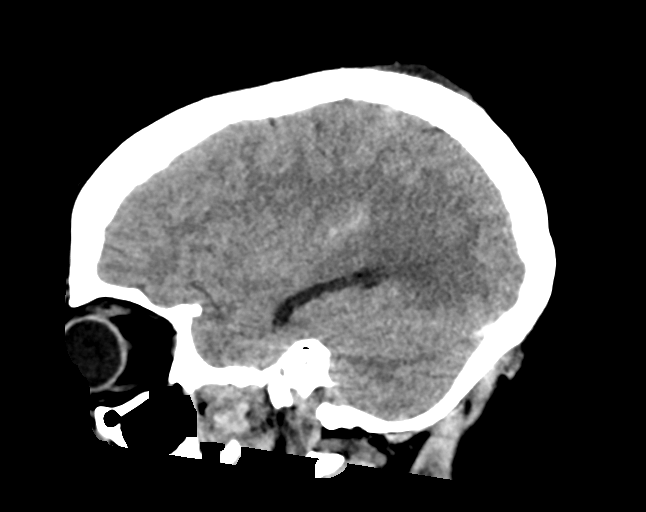

[16 of 47 positions shown; findings below may reference images not displayed]

FINDINGS: Brain: Decreased conspicuity of small volume subarachnoid hemorrhage
along the bilateral frontoparietal regions within the basilar
cistern. No new site of intracranial hemorrhage. Known small
bilateral cerebral infarcts are not well demonstrated on this exam.
Increased conspicuity of left cerebral edema.

No midline shift or mass lesion.  No ventriculomegaly.

Vascular: Hyperdense appearance of the cortical veins,
posterosuperior sagittal sinus and right transverse sinus is less
conspicuous than prior exam. No hyperdense arterial vessels.

Skull: Negative for fracture or focal lesion.

Sinuses/Orbits: No acute finding. Mild pansinus mucosal thickening
with layering secretions.

Other: None.
IMPRESSION: Small volume bilateral frontoparietal and basilar cistern
subarachnoid hemorrhage is unchanged. No new intracranial
hemorrhage.

Small bilateral cerebral infarcts are not well demonstrated on this
exam.

Increased conspicuity of left cerebral edema.

Hyperdense appearance of the cortical veins, superior sagittal and
right transverse sinus is less conspicuous than prior exam.

## 2020-02-14 MED ORDER — PHENYLEPHRINE CONCENTRATED 100MG/250ML (0.4 MG/ML) INFUSION SIMPLE
25.0000 ug/min | INTRAVENOUS | Status: DC
  Administered 2020-02-15: 85 ug/min via INTRAVENOUS
  Filled 2020-02-14: qty 250

## 2020-02-14 MED ORDER — SODIUM CHLORIDE 0.9% FLUSH
10.0000 mL | Freq: Two times a day (BID) | INTRAVENOUS | Status: DC
  Administered 2020-02-14: 30 mL
  Administered 2020-02-14: 10 mL
  Administered 2020-02-15: 30 mL
  Administered 2020-02-15 – 2020-02-28 (×23): 10 mL

## 2020-02-14 MED ORDER — LORAZEPAM 2 MG/ML IJ SOLN
2.0000 mg | INTRAMUSCULAR | Status: DC | PRN
  Administered 2020-02-18: 2 mg via INTRAVENOUS
  Filled 2020-02-14: qty 1

## 2020-02-14 MED ORDER — PROSOURCE TF PO LIQD
45.0000 mL | Freq: Two times a day (BID) | ORAL | Status: DC
  Administered 2020-02-14 – 2020-02-17 (×6): 45 mL
  Filled 2020-02-14 (×7): qty 45

## 2020-02-14 MED ORDER — SODIUM CHLORIDE 0.9% FLUSH: 10.0000 mL | INTRAVENOUS | Status: DC | PRN

## 2020-02-14 MED ORDER — VITAL HIGH PROTEIN PO LIQD
1000.0000 mL | ORAL | Status: DC
  Administered 2020-02-14 – 2020-02-15 (×2): 1000 mL

## 2020-02-14 MED ORDER — CHLORHEXIDINE GLUCONATE 0.12% ORAL RINSE (MEDLINE KIT)
15.0000 mL | Freq: Two times a day (BID) | OROMUCOSAL | Status: DC
  Administered 2020-02-14 – 2020-02-20 (×14): 15 mL via OROMUCOSAL

## 2020-02-14 MED ORDER — ORAL CARE MOUTH RINSE
15.0000 mL | OROMUCOSAL | Status: DC
  Administered 2020-02-14 – 2020-02-20 (×63): 15 mL via OROMUCOSAL

## 2020-02-14 MED ORDER — HEPARIN (PORCINE) 25000 UT/250ML-% IV SOLN
750.0000 [IU]/h | INTRAVENOUS | Status: DC
  Administered 2020-02-15: 750 [IU]/h via INTRAVENOUS
  Filled 2020-02-14: qty 250

## 2020-02-14 MED ORDER — ATROPINE SULFATE 1 MG/10ML IJ SOSY
PREFILLED_SYRINGE | INTRAMUSCULAR | Status: AC
  Filled 2020-02-14: qty 10

## 2020-02-14 MED ORDER — PHENYLEPHRINE HCL-NACL 10-0.9 MG/250ML-% IV SOLN
25.0000 ug/min | INTRAVENOUS | Status: DC
  Administered 2020-02-14: 55 ug/min via INTRAVENOUS
  Administered 2020-02-14: 25 ug/min via INTRAVENOUS
  Administered 2020-02-14: 35 ug/min via INTRAVENOUS
  Administered 2020-02-14: 75 ug/min via INTRAVENOUS
  Administered 2020-02-14: 80 ug/min via INTRAVENOUS
  Filled 2020-02-14: qty 250
  Filled 2020-02-14: qty 500
  Filled 2020-02-14 (×3): qty 250

## 2020-02-14 NOTE — Progress Notes (Signed)
Pt's HR maintains in 50s and intermittently high 40s. Called to informed MD Agarwala. No new orders at this time. Blood pressure stable. Will continue to monitor.

## 2020-02-14 NOTE — Progress Notes (Signed)
[facility] Progress Note Patient Name: Jill Clarke DOB: April 02, 1985 MRN: [id number]   Date of Service  02/14/2020  HPI/Events of Note  Nursing concerned about ST elevation. 12 Lead EKG reveals sinus bradycardia Low voltage QRS. Lateral infarct , age undetermined.   eICU Interventions  Plan: 1. Cycle Troponin.      Intervention Category Major Interventions: Other:  Lysle Dingwall 02/14/2020, 11:18 PM

## 2020-02-14 NOTE — Progress Notes (Signed)
Patient transported to CT and returned to 8P10 without complications. Vitals stable throughout. RT will continue to monitor.

## 2020-02-14 NOTE — Progress Notes (Signed)
PT Cancellation Note  Patient Details Name: Jill Clarke MRN: [id number] DOB: 03-10-1985   Cancelled Treatment:    Reason Eval/Treat Not Completed: Medical issues which prohibited therapy  Attempted to see patient x 2 with pt gone to CT and then having an in-room sterile procedure. Appears pt is now moderately sedated.   Will continue to follow for appropriateness for PT evaluation.    Arby Barrette, PT Pager [phone]   Rexanne Mano 02/14/2020, 12:35 PM

## 2020-02-14 NOTE — Progress Notes (Signed)
Per Dr. Lake Bells, okay to infuse the 3% saline through the Right venous sheath that is in place from IR procedure. Patient has no central line at this time, pt came back from IR with 2 hand peripheral IV's. Another PIV was placed by this RN in the Left AC. All lines are being utilized with perscribed medications. Will continue to monitor.

## 2020-02-14 NOTE — Progress Notes (Signed)
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18*  CREATININE 0.61  --   --   --   --  <0.20*  --  0.48  --  0.47  --   --    < > = values in this interval not displayed.    Assessment: 35yo female subtherapeutic on heparin with lower heparin level despite increased rate; no gtt issues or signs of bleeding per RN.  Goal of Therapy:  Heparin level 0.3-0.5 units/ml   Plan:  Will increase heparin gtt by 3 units/kg/hr to 750 units/hr and check level in 6 hours.    Wynona Neat, PharmD, BCPS  02/14/2020,11:28 PM   Addendum: Heparin level now 0.36, at goal.  Will continue heparin gtt at current rate and confirm stable with additional level.   VB  02/15/2020 6:22 AM

## 2020-02-14 NOTE — Progress Notes (Signed)
ANTICOAGULATION CONSULT NOTE  Pharmacy Consult:  Heparin Indication: Dural venous sinus thrombosis s/p thrombectomy  No Known Allergies  Patient Measurements: Height: 5\' 5"  (165.1 cm) Weight: 68.4 kg (150 lb 12.7 oz) IBW/kg (Calculated) : 57 Heparin Dosing Weight: 68 kg  Vital Signs: Temp: 98.1 F (36.7 C) (01/22 1200) Temp Source: Axillary (01/22 1200) BP: 141/83 (01/22 1556) Pulse Rate: 102 (01/22 1556)  Labs: Recent Labs    02/12/20 0127 02/12/20 0417 02/12/20 1600 02/13/20 0333 02/13/20 1008 02/13/20 2058 02/14/20 0306 02/14/20 0505 02/14/20 0638 02/14/20 1111 02/14/20 1549  HGB 12.6  --   --  11.0*  --  7.5* 8.2*  --   --  8.6*  --   HCT 37.0  --   --  34.1*  --  22.0* 24.0*  --   --  27.7*  --   PLT 578*  --   --  564*  --   --   --   --   --  575*  --   LABPROT  --  14.7  --   --   --   --   --   --   --   --   --   INR  --  1.2  --   --   --   --   --   --   --   --   --   HEPARINUNFRC  --   --    < > 0.48   < >  --   --  1.52* 1.14* 0.31 0.23*  CREATININE 0.61  --   --   --   --  <0.20*  --  0.48  --  0.47  --    < > = values in this interval not displayed.    Estimated Creatinine Clearance: 95.4 mL/min (by C-G formula based on SCr of 0.47 mg/dL).   Assessment: 6 YOF with AMS, found to have a small volume subarachnoid hemorrhage and extensive dural venous thrombosis.  Pharmacy consulted to manage IV heparin per stroke protocol. She is s/p thrombectomy in IR on 1/21.  Heparin levels this am reported as 1.52 and repeat was 1.14 however thought to be inaccurate as drawn from left side near infusion. Heparin was held 1 hr then reduced to 400 units/hr. Heparin level drawn from PICC about 4 hrs later was therapeutic at 0.31 on 400 units/hr. Repeat HL from PICC low at 0.23, heparin still running in L PIV.  No issues with infusion or bleeding per RN.  Goal of Therapy:  Heparin level 0.3-0.5 units/ml Monitor platelets by anticoagulation protocol: Yes   Plan:   Increase heparin drip to 550 units/hr F/u 6hr HL   Daily heparin level and CBC Monitor for s/sx of bleeding  Thank you for involving pharmacy in this patient's care.  Benetta Spar, PharmD, BCPS, BCCP Clinical Pharmacist  Please check AMION for all [facility] phone numbers After 10:00 PM, call [facility] [phone]

## 2020-02-14 NOTE — Progress Notes (Addendum)
Referring Physician(s): DR [name]  Supervising Physician: Luanne Bras  Patient Status:  [facility] - In-pt  Chief Complaint:  Acute dural sinus venous thrombosis with associated watershed infracts and small SAH  Subjective:  Findings. 1.Extensive thrombus load in the TS bilaterally,the Rt SS ,RT IJV and post one third of the SSS. Large chunks of clot removed with revascularization of the TS bilaterally and partially the RT SS and the torcula. Sig decreased venous congestion of the cortical veins noted post procedure. Post CT brain No ICH or mass effect or hydrocephalus. Clinically stable with pupils 46mm sluggish.  Intubated and sedated Going to CT now  ON heparin UOP great   Allergies: Patient has no known allergies.  Medications: Prior to Admission medications   Not on File     Vital Signs: BP 133/82   Pulse (!) 52   Temp 98.1 F (36.7 C) (Oral)   Resp 17   Ht 5\' 5"  (1.651 m)   Wt 150 lb 12.7 oz (68.4 kg)   LMP 02/03/2020 (Approximate)   SpO2 100%   BMI 25.09 kg/m   Physical Exam Vitals reviewed.  Skin:    General: Skin is warm.     Comments: Bilat groins are clean and dry NO bleeding No hematoma  Pulses good  UOP great      Imaging: CT Head Wo Contrast  Result Date: 02/12/2020 CLINICAL DATA:  Mental status change. EXAM: CT HEAD WITHOUT CONTRAST TECHNIQUE: Contiguous axial images were obtained from the base of the skull through the vertex without intravenous contrast. COMPARISON:  None. FINDINGS: Brain: There is a small volume of subarachnoid hemorrhage bilaterally, most evident in the right frontal parietal region (axial series 3, image 14). No midline shift. The ventricular system is unremarkable.There is a small volume of extra-axial hemorrhage in the basal cisterns. The gray-white differentiation is unremarkable. The cortical veins appear hyperdense and somewhat enlarged. There is a questionable thrombus within the right transverse sinus.  Vascular: There is no hyperdense arterial structure. Skull: The calvarium is unremarkable. The skull base is unremarkable. The visualized upper cervical spine is unremarkable. Sinuses/Orbits: The visualized orbits are unremarkable. The paranasal sinuses are unremarkable. The mastoid air cells are clear. Other: The visualized parotid gland is unremarkable. There is no scalp soft tissue swelling. IMPRESSION: 1. The study is positive for a small volume of subarachnoid hemorrhage. There is no midline shift. 2. Hyperdense dural sinuses and cortical veins especially on the right in addition to a filling defect within the transverse sinus on the right is concerning for dural venous thrombosis and may be the source of the patient's subarachnoid hemorrhage. Emergent MRI/MRV is recommended for further evaluation. These results were called by telephone at the time of interpretation on 02/12/2020 at 4:10 am to provider Veryl Speak , who verbally acknowledged these results. Electronically Signed   By: Constance Holster M.D.   On: 02/12/2020 04:13   MR BRAIN WO CONTRAST  Result Date: 02/12/2020 CLINICAL DATA:  Mental status change with unknown cause. EXAM: MRI HEAD WITHOUT CONTRAST MRV HEAD WITHOUT CONTRAST TECHNIQUE: Multiplanar, multiecho pulse sequences of the brain and surrounding structures were obtained without intravenous contrast. Angiographic images of the intracranial venous structures were obtained using MRV technique without intravenous contrast. COMPARISON:  Head CT from earlier today FINDINGS: MRI HEAD WITHOUT CONTRAST Brain: Small patchy acute infarcts in the bilateral cerebral white matter and along the high left sylvian fissure where there is likely accentuation by susceptibility artifact. These are in a deep  watershed pattern. Limited sulci, even for age which likely reflects diffuse swelling. Focal swelling is seen to a greater extent than the infarction along the high perirolandic cortex, left more than  right. Small volume subarachnoid hemorrhage as seen on head CT, mainly at the areas of greatest swelling. There is marked intravascular susceptibility signal involving the right more than left cortical, dural sinus, and deep veins due to intravascular deoxygenation/slow flow. No hydrocephalus. Vascular: Dural venous sinus thrombosis with further description below. Skull and upper cervical spine: No focal marrow signal abnormality Sinuses/Orbits: Negative MR VENOGRAM WITHOUT CONTRAST Abrupt occlusion of the mid superior sagittal sinus with confluent thrombus extending to the torcula, right transverse, right sigmoid, and right upper internal jugular vein. Bilateral vein of Trolard thrombosis. The straight sinus and vein of Galen are also non flowing. No internal cerebral flow on either side. Critical Value/emergent results were called by telephone at the time of interpretation on 02/12/2020 at 6:49 am to provider Geoffery Lyons , who verbally acknowledged these results. IMPRESSION: 1. Extensive, acute dural venous sinus thrombosis involving the superior sagittal sinus into the right internal jugular vein via the dominant right transverse and sigmoid system. Both veins of Trolard are thrombosed and there is straight sinus/deep cerebral thrombosis as well. 2. Deep watershed white matter infarcts, small volume subarachnoid hemorrhage, and left more than right frontoparietal cortical swelling due to #1. Electronically Signed   By: Marnee Spring M.D.   On: 02/12/2020 06:50   MR Venogram Head  Result Date: 02/12/2020 CLINICAL DATA:  Mental status change with unknown cause. EXAM: MRI HEAD WITHOUT CONTRAST MRV HEAD WITHOUT CONTRAST TECHNIQUE: Multiplanar, multiecho pulse sequences of the brain and surrounding structures were obtained without intravenous contrast. Angiographic images of the intracranial venous structures were obtained using MRV technique without intravenous contrast. COMPARISON:  Head CT from earlier today  FINDINGS: MRI HEAD WITHOUT CONTRAST Brain: Small patchy acute infarcts in the bilateral cerebral white matter and along the high left sylvian fissure where there is likely accentuation by susceptibility artifact. These are in a deep watershed pattern. Limited sulci, even for age which likely reflects diffuse swelling. Focal swelling is seen to a greater extent than the infarction along the high perirolandic cortex, left more than right. Small volume subarachnoid hemorrhage as seen on head CT, mainly at the areas of greatest swelling. There is marked intravascular susceptibility signal involving the right more than left cortical, dural sinus, and deep veins due to intravascular deoxygenation/slow flow. No hydrocephalus. Vascular: Dural venous sinus thrombosis with further description below. Skull and upper cervical spine: No focal marrow signal abnormality Sinuses/Orbits: Negative MR VENOGRAM WITHOUT CONTRAST Abrupt occlusion of the mid superior sagittal sinus with confluent thrombus extending to the torcula, right transverse, right sigmoid, and right upper internal jugular vein. Bilateral vein of Trolard thrombosis. The straight sinus and vein of Galen are also non flowing. No internal cerebral flow on either side. Critical Value/emergent results were called by telephone at the time of interpretation on 02/12/2020 at 6:49 am to provider Geoffery Lyons , who verbally acknowledged these results. IMPRESSION: 1. Extensive, acute dural venous sinus thrombosis involving the superior sagittal sinus into the right internal jugular vein via the dominant right transverse and sigmoid system. Both veins of Trolard are thrombosed and there is straight sinus/deep cerebral thrombosis as well. 2. Deep watershed white matter infarcts, small volume subarachnoid hemorrhage, and left more than right frontoparietal cortical swelling due to #1. Electronically Signed   By: Kathrynn Ducking.D.  On: 02/12/2020 06:50   DG Chest Port 1  View  Result Date: 02/13/2020 CLINICAL DATA:  01/19/2014 EXAM: PORTABLE CHEST 1 VIEW COMPARISON:  Respiratory FINDINGS: Later endotracheal tube is seen with its tip just within the right mainstem bronchus. Nasogastric tube tip is seen within the proximal body of the stomach with its proximal side hole at the expected gastroesophageal junction. Retrocardiac opacity likely relates to partial left lower lobe collapse. Right lung is clear. No pneumothorax or pleural effusion. Cardiac size within normal limits. Pulmonary vascularity is normal. IMPRESSION: Right mainstem intubation. Withdrawal of the endotracheal tube by approximately 2.5 cm may better position the catheter. Partial left lower lobe collapse. Nasogastric tube proximal side hole at the gastroesophageal junction. Advancement by roughly 5 cm may better position the catheter. These results will be called to the ordering clinician or representative by the Radiologist Assistant, and communication documented in the PACS or [employer]. Electronically Signed   By: Fidela Salisbury MD   On: 02/13/2020 23:35   EEG adult  Result Date: 02/12/2020 Lora Havens, MD     02/12/2020  4:06 PM Patient Name: Jill Clarke MRN: [id number] Epilepsy Attending: Lora Havens Referring Physician/Provider: Dr. Kathrynn Speed Date: 02/12/2020 Duration: 25.25 minutes Patient history: 35 year old female with altered mental status.  EEG to evaluate for seizures. Level of alertness: Awake, asleep AEDs during EEG study: None Technical aspects: This EEG study was done with scalp electrodes positioned according to the 10-20 International system of electrode placement. Electrical activity was acquired at a sampling rate of 500Hz  and reviewed with a high frequency filter of 70Hz  and a low frequency filter of 1Hz . EEG data were recorded continuously and digitally stored. Description: Np posterior dominant rhythm was seen. Sleep was characterized by sleep spindles (12 to  14 Hz), maximal frontocentral region.  EEG showed continuous generalized 3 to 5 Hz theta and delta slowing.  Hyperventilation and photic stimulation were not performed. At 1531, patient was noted to move her right arm towards her chest followed by slow but rhythmic jerking of right upper extremity.  Concomitant EEG showed high amplitude 2 to 3 Hz rhythmic delta slowing in bitemporal region which gradually increases in amplitude and appears more sharply contoured without definite evolution in frequency. ABNORMALITY -Continue slow, generalized IMPRESSION: This study is suggestive of moderate diffuse encephalopathy, nonspecific etiology.  At 1531, patient was noted to have right upper extremity jerking.  Concomitant EEG showed high amplitude 2 to 3 Hz rhythmic delta slowing in bitemporal region with evolution in morphology and amplitude but without definite evolution in frequency concerning for focal motor seizure. Dr. Leonel Ramsay was notified. Priyanka O Yadav   [place] EKG SITE RITE  Result Date: 02/13/2020 If [facility] image not attached, placement could not be confirmed due to current cardiac rhythm.   Labs:  CBC: Recent Labs    02/10/20 2110 02/12/20 0127 02/13/20 0333 02/13/20 2058 02/14/20 0306  WBC 11.7* 13.6* 13.3*  --   --   HGB 13.6 12.6 11.0* 7.5* 8.2*  HCT 41.2 37.0 34.1* 22.0* 24.0*  PLT 634* 578* 564*  --   --     COAGS: Recent Labs    02/12/20 0417  INR 1.2    BMP: Recent Labs    02/10/20 2110 02/12/20 0127 02/13/20 1521 02/13/20 2058 02/14/20 0004 02/14/20 0306 02/14/20 0505  NA 135 137   < > 154* 149* 156* 153*  K 2.9* 3.5  --  2.8*  --  2.9* 2.9*  CL 95* 103  --  118*  --   --  123*  CO2 18* 20*  --   --   --   --  21*  GLUCOSE 150* 168*  --  94  --   --  120*  BUN 12 9  --  3*  --   --  <5*  CALCIUM 9.4 9.3  --   --   --   --  7.8*  CREATININE 0.82 0.61  --  <0.20*  --   --  0.48  GFRNONAA >60 >60  --   --   --   --  >60   < > = values in this interval not  displayed.    LIVER FUNCTION TESTS: Recent Labs    02/10/20 2110 02/12/20 0127  BILITOT 1.6* 1.0  AST 32 16  ALT 42 30  ALKPHOS 58 52  PROT 8.5* 8.3*  ALBUMIN 3.9 3.6    Assessment and Plan:  Procedure in IR 1/21 pm:  Extensive thrombus load in the TS bilaterally,the Rt SS ,RT IJV and post one third of the SSS. Large chunks of clot removed with revascularization of the TS bilaterally and partially the RT SS and the torcula. Sig decreased venous congestion of the cortical veins noted post procedure.  Stable Hep ongoing To CT now  Dr Estanislado Pandy to discuss with Dr [name]  Electronically Signed: Lavonia Drafts, PA-C 02/14/2020, 9:14 AM   I spent a total of 15 Minutes at the the patient's bedside AND on the patient's hospital floor or unit, greater than 50% of which was counseling/coordinating care for post IR procedure

## 2020-02-14 NOTE — Plan of Care (Signed)
  Problem: Nutrition: Goal: Adequate nutrition will be maintained Outcome: Progressing   Problem: Safety: Goal: Ability to remain free from injury will improve Outcome: Progressing   Problem: Activity: Goal: Risk for activity intolerance will decrease Outcome: Not Progressing

## 2020-02-14 NOTE — Progress Notes (Addendum)
STROKE TEAM PROGRESS NOTE  INTERVAL HISTORY Patient evaluated at bedside this morning, no family present in the room.  Patient is under sedation with propofol and fentanyl.  Patient is still stuporous, only responds to noxious stimuli and moves all 4 extremities but moves left side much more than right side.  Right eye shows hypertropia with normal  position of left eye and, corneal reflex present bilaterally.  She underwent thrombectomy yesterday, with good results on scan this morning.  Both groin arterial and venous sheath has been left in place if patient needs to be taken to do another procedure.  Patient's hypertonic saline is decreased to 65 cc today and pantoprazole is started.  MR brain and MR venogram is ordered for tomorrow morning to follow-up further.  Blood pressure well controlled.  Neurological exam unchanged at this point   Vitals:   02/14/20 0500 02/14/20 0515 02/14/20 0530 02/14/20 0545  BP: 121/78 123/77 123/87 122/84  Pulse: (!) 55 (!) 54 77 (!) 54  Resp: 17 17 (!) 21 17  Temp:      TempSrc:      SpO2: 100% 100% 100% 100%  Weight:      Height:       CBC:  Recent Labs  Lab 02/12/20 0127 02/13/20 0333 02/13/20 2058 02/14/20 0306  WBC 13.6* 13.3*  --   --   HGB 12.6 11.0* 7.5* 8.2*  HCT 37.0 34.1* 22.0* 24.0*  MCV 88.1 90.5  --   --   PLT 578* 564*  --   --    Basic Metabolic Panel:  Recent Labs  Lab 02/10/20 2110 02/12/20 0127 02/13/20 1521 02/13/20 2058 02/14/20 0004 02/14/20 0306  NA 135 137   < > 154* 149* 156*  K 2.9* 3.5  --  2.8*  --  2.9*  CL 95* 103  --  118*  --   --   CO2 18* 20*  --   --   --   --   GLUCOSE 150* 168*  --  94  --   --   BUN 12 9  --  3*  --   --   CREATININE 0.82 0.61  --  <0.20*  --   --   CALCIUM 9.4 9.3  --   --   --   --    < > = values in this interval not displayed.   Lipid Panel:  Recent Labs  Lab 02/13/20 0333  CHOL 148  TRIG 60  HDL 28*  CHOLHDL 5.3  VLDL 12  LDLCALC 108*   HgbA1c:  Recent Labs  Lab  02/13/20 0333  HGBA1C 4.9   Urine Drug Screen:  Recent Labs  Lab 02/12/20 0701  LABOPIA NONE DETECTED  COCAINSCRNUR NONE DETECTED  LABBENZ NONE DETECTED  AMPHETMU NONE DETECTED  THCU NONE DETECTED  LABBARB NONE DETECTED    Alcohol Level  Recent Labs  Lab 02/12/20 0417  ETH <10    IMAGING past 24 hours  CT Head wo contrast 02/14/2020  IMPRESSION: Small volume bilateral frontoparietal and basilar cistern subarachnoid hemorrhage is unchanged. No new intracranial hemorrhage.  Small bilateral cerebral infarcts are not well demonstrated on this exam.  Increased conspicuity of left cerebral edema.  Hyperdense appearance of the cortical veins, superior sagittal and right transverse sinus is less conspicuous than prior exam.  IR Thrombectomy 02/13/2020  Findings. 1.Extensive thrombus load in the TS bilaterally,the Rt SS ,RT IJV and post one third of the SSS. Large chunks of clot  removed with revascularization of the TS bilaterally and partially the RT SS and the torcula. Sig decreased venous congestion of the cortical veins noted post procedure. Post CT brain No ICH or mass effect or hydrocephalus. Clinically stable with pupils 31mm sluggish.  CT Head wo contrast 02/12/2020  IMPRESSION: 1. The study is positive for a small volume of subarachnoid hemorrhage. There is no midline shift. 2. Hyperdense dural sinuses and cortical veins especially on the right in addition to a filling defect within the transverse sinus on the right is concerning for dural venous thrombosis and may be the source of the patient's subarachnoid hemorrhage. Emergent MRI/MRV is recommended for further evaluation.  MR Brain wo contrast MR Venogram Head 02/12/2020  IMPRESSION: 1. Extensive, acute dural venous sinus thrombosis involving the superior sagittal sinus into the right internal jugular vein via the dominant right transverse and sigmoid system. Both veins of Trolard are thrombosed  and there is straight sinus/deep cerebral thrombosis as well. 2. Deep watershed white matter infarcts, small volume subarachnoid hemorrhage, and left more than right frontoparietal cortical swelling due to #1.  EEG adult 02/12/2020  IMPRESSION: This study is suggestive of moderate diffuse encephalopathy, nonspecific etiology.  At 1531, patient was noted to have right upper extremity jerking.  Concomitant EEG showed high amplitude 2 to 3 Hz rhythmic delta slowing in bitemporal region with evolution in morphology and amplitude but without definite evolution in frequency concerning for focal motor seizure.   PHYSICAL EXAM GENERAL: Stuporous.  Intubated, laying in bed, in no acute distress Head: Normocephalic and atraumatic. EENT: No OP obstruction, normal conjunctiva.  LUNGS - Normal respiratory effort on room air CV - Tachycardic to the 130's on cardiac monitor, 2+ pedal pulses ABDOMEN - Soft, non-distended Ext: warm, well perfused Neurological:  Mental Status: Patient is intubated and sedated Stuporous, does not respond to verbal stimuli, does not follow commands.  Opens eyes partially but cannot sustain attention Cranial Nerves: Right eye slightly hypertrophic left eye position normal, doll's eye reflex +.  Both pupils small sluggishly reactive.  Corneal reflexes are present bilaterally face is symmetric.  Tongue midline.  Fundi not visualized Motor: Withdraws all 4 extremities to noxious stimuli.  But moves left side much greater than right Sensory: Unable to access. Deep Tendon Reflexes:  Right: Upper Extremity   Left: Upper extremity   biceps (C-5 to C-6) 2/4   biceps (C-5 to C-6) 2/4 tricep (C7) 2/4    triceps (C7) 2/4 Brachioradialis (C6) 2/4  Brachioradialis (C6) 2/4  Lower Extremity Lower Extremity  quadriceps (L-2 to L-4) 2/4   quadriceps (L-2 to L-4) 2/4 Achilles (S1) 2/4   Achilles (S1) 2/4 Gait: Deferred   ASSESSMENT/PLAN Ms. Jill Clarke is a 35 y.o.  female with PMHx of asthma and sickle cell trait presented with altered mental status.  CT head revealed a small volume subarachnoid hemorrhage and hyperdense dural sinuses with filling defect concerning for dural venous thrombosis.  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  Extremely delicate situation, that is why, IR is consulted for thrombectomy and patient is with IR now.  Stroke: Deep watershed white matter infarct, small volume subarachnoid hemorrhage and acute dural venous sinus thrombosis involving superior sagittal sinus into the right internal jugular vein via the dominant right transverse and sigmoid system, likely due to dehydration at this point but looking into hypercoagulable panel, any recent COVID infection.     Code Stroke CT head: positive for  a small volume of subarachnoid hemorrhage. There is no midline shift. Hyperdense dural sinuses and cortical veins especially on the right in addition to a filling defect within the transverse sinus on the right is concerning for dural venous thrombosis and may be the source of the patient's subarachnoid hemorrhage.  MRI  and MRA: A). Extensive, acute dural venous sinus thrombosis involving the superior sagittal sinus into the right internal jugular vein via the dominant right transverse and sigmoid system.                                   B).Both veins of Trolard are thrombosed and there is straight sinus/deep cerebral thrombosis as well.                                  C). Deep watershed white matter infarcts, small volume subarachnoid hemorrhage, and left more than right frontoparietal cortical swelling due to #1.  2D Echo: not ordered  IR Thrombectomy: On 02/13/2020- Large chunks of clot removed with revascularization of the TS bilaterally and partially the RT SS and the torcula.Sig decreased venous congestion of the cortical veins noted post procedure.Post CT brain No ICH or  mass effect or hydrocephalus.  LDL 108  HgbA1c 4.9  Hypercoagulable panel: Decreased protein S, but may be an acute phase reactant and should be rechecked as an outpatient.  In 6 to 8 weeks  Repeat COVID testing: Pending  VTE prophylaxis -IV heparin    Diet   Diet NPO time specified    No antithrombotic prior to admission, now on IV heparin therapy recommendations: Pending  Disposition: Pending  Hypertension  Home meds: None  Stable . Strict blood pressure control . Long-term BP goal normotensive  Hyperlipidemia  Home meds: None  LDL 108, goal < 70  Start statin when patient stable  Continue statin at discharge   Other Stroke Risk Factors Sickle Cell Trait: No associated risk found due to sickle cell trait.   Other Active Problems  Asthma  Hospital day # 2 Patient's neurological exam is essentially unchanged with stupor and right hemiparesis with follow-up CT scan from this morning showing slight improvement in hyperdensities in the posterior fossa but there is slight increased conspicuity of the left parietal edema.  Continue hypertonic saline but decrease rate to 65 cc an hour as serum sodium is 153 this morning.  Continue IV heparin drip and start nutrition and tube feeding.  Repeat MRI scan of the brain with MR venogram tomorrow morning and if she still has significant clot burden may need to repeat mechanical thrombectomy in a.m or earlier if there is neurological worsening.  Long discussion with patient's mother over the phone and answered questions.  Discussed with Dr. Estanislado Pandy and with Dr. Kipp Brood critical care medicine. This patient is critically ill and at significant risk of neurological worsening, death and care requires constant monitoring of vital signs, hemodynamics,respiratory and cardiac monitoring, extensive review of multiple databases, frequent neurological assessment, discussion with family, other specialists and medical decision making of  high complexity.I have made any additions or clarifications directly to the above note.This critical care time does not reflect procedure time, or teaching time or supervisory time of PA/NP/Med Resident etc but could involve care discussion time.  I spent 60 minutes of neurocritical care time  in the care of  this patient.  Antony Contras, MD Medical Director [facility] St[facility] Pager: [phone] 02/14/2020 6:20 AM   To contact Stroke Continuity provider, please refer to [email or web address] After hours, contact General Neurology

## 2020-02-14 NOTE — Progress Notes (Signed)
ANTICOAGULATION CONSULT NOTE  Pharmacy Consult:  Heparin Indication: Dural venous sinus thrombosis s/p thrombectomy  No Known Allergies  Patient Measurements: Height: 5\' 5"  (165.1 cm) Weight: 68.4 kg (150 lb 12.7 oz) IBW/kg (Calculated) : 57 Heparin Dosing Weight: 68 kg  Vital Signs: Temp: 97.5 F (36.4 C) (01/22 0800) Temp Source: Axillary (01/22 0800) BP: 132/70 (01/22 1235) Pulse Rate: 62 (01/22 1235)  Labs: Recent Labs    02/12/20 0127 02/12/20 0417 02/12/20 1600 02/13/20 0333 02/13/20 1008 02/13/20 2058 02/14/20 0306 02/14/20 0505 02/14/20 0638 02/14/20 1111  HGB 12.6  --   --  11.0*  --  7.5* 8.2*  --   --  8.6*  HCT 37.0  --   --  34.1*  --  22.0* 24.0*  --   --  27.7*  PLT 578*  --   --  564*  --   --   --   --   --  575*  LABPROT  --  14.7  --   --   --   --   --   --   --   --   INR  --  1.2  --   --   --   --   --   --   --   --   HEPARINUNFRC  --   --    < > 0.48   < >  --   --  1.52* 1.14* 0.31  CREATININE 0.61  --   --   --   --  <0.20*  --  0.48  --  0.47   < > = values in this interval not displayed.    Estimated Creatinine Clearance: 95.4 mL/min (by C-G formula based on SCr of 0.47 mg/dL).   Assessment: 69 YOF with AMS, found to have a small volume subarachnoid hemorrhage and extensive dural venous thrombosis.  Pharmacy consulted to manage IV heparin per stroke protocol. She is s/p thrombectomy in IR on 1/21.  Heparin levels this am reported as 1.52 and repeat was 1.14 however thought to be inaccurate as drawn from left side near infusion. Heparin was held 1 hr then reduced to 400 units/hr. Heparin level drawn from PICC about 4 hrs later was therapeutic at 0.31 on 400 units/hr - suspect patient will need a rate increase in near future to keep level >0.3. No bleeding noted, Hgb low stable 7-8s, platelets are elevated.  Goal of Therapy:  Heparin level 0.3-0.5 units/ml Monitor platelets by anticoagulation protocol: Yes   Plan:  Continue heparin  drip at 400 units/hr 16:00 heparin level Daily heparin level and CBC Monitor for s/sx of bleeding  Thank you for involving pharmacy in this patient's care.  Renold Genta, PharmD, BCPS Clinical Pharmacist Clinical phone for 02/14/2020 until 3p is [phone] 02/14/2020 1:07 PM  **Pharmacist phone directory can be found on [email or web address] listed under [place]**

## 2020-02-14 NOTE — Progress Notes (Signed)
Peripherally Inserted Central Catheter Placement  The IV Nurse has discussed with the patient and/or persons authorized to consent for the patient, the purpose of this procedure and the potential benefits and risks involved with this procedure.  The benefits include less needle sticks, lab draws from the catheter, and the patient may be discharged home with the catheter. Risks include, but not limited to, infection, bleeding, blood clot (thrombus formation), and puncture of an artery; nerve damage and irregular heartbeat and possibility to perform a PICC exchange if needed/ordered by physician.  Alternatives to this procedure were also discussed.  Bard Power PICC patient education guide, fact sheet on infection prevention and patient information card has been provided to patient /or left at bedside.    PICC Placement Documentation  PICC Triple Lumen 02/14/20 PICC Right Brachial 36 cm 0 cm (Active)  Indication for Insertion or Continuance of Line Prolonged intravenous therapies 02/14/20 1048  Exposed Catheter (cm) 0 cm 02/14/20 1048  Site Assessment Clean;Dry;Intact 02/14/20 1048  Lumen #1 Status Flushed;Blood return noted;Saline locked 02/14/20 1048  Lumen #2 Status Flushed;Blood return noted;Saline locked 02/14/20 1048  Lumen #3 Status Flushed;Saline locked 02/14/20 1048  Dressing Type Transparent 02/14/20 1048  Dressing Status Dry;Clean;Intact 02/14/20 1048  Antimicrobial disc in place? Yes 02/14/20 1048  Dressing Change Due 02/21/20 02/14/20 1048       Riven Mabile, Adelina Mings Ramos 02/14/2020, 10:50 AM

## 2020-02-14 NOTE — Progress Notes (Signed)
Verbal order from MD Sethi at bedside to change rate of 3% saline from 75 mL/hr to 65 mL/hr. Rate changed.

## 2020-02-14 NOTE — Progress Notes (Signed)
CRITICAL VALUE ALERT  Critical Value:  Sodium 161  Date & Time Notied:  02/14/2020 1935  Provider Notified: Dr. Theda Sers, Neuro MD  Orders Received/Actions taken: New order placed to recheck sodium. Will continue to monitor.

## 2020-02-14 NOTE — Progress Notes (Signed)
At bedside to place PICC, consent obtained from mother via telephone.  Rn states pt going to Stat CT at this time and will notify PICC team when pt returns

## 2020-02-14 NOTE — Progress Notes (Signed)
Spoke to MD Agarwala about difficulty with sedating patient effectively while maintain blood pressure goals. Informed that pt had increased coughing and there was concern of coughing out femoral sheath. Verbal order received to administer 2mg  ativan every hour PRN as needed for agitation.

## 2020-02-14 NOTE — Progress Notes (Signed)
eLink Physician-Brief Progress Note Patient Name: Jill Clarke DOB: 1985-12-07 MRN: [id number]   Date of Service  02/14/2020  HPI/Events of Note    eICU Interventions  I notified Dr. Theda Sers of neurology about patient per request of Dr. Estanislado Pandy.        Jill Clarke 02/14/2020, 12:19 AM

## 2020-02-14 NOTE — Consult Note (Signed)
NAME:  Jill Clarke, MRN:  [id number], DOB:  [birth year], LOS: 2 ADMISSION DATE:  02/12/2020, CONSULTATION DATE:  02/13/2020 REFERRING MD:  Leonel Ramsay CHIEF COMPLAINT:  AMS   Brief History:  16 yoF presented with AMS found to have a small volume SAH and dural venous thrombosis with deep watershed white matter infarcts. PCCM consulted for possible post-op intubation/ventilation management.  History of Present Illness:  74 yoF initially presented to the ED 02/10/20 due to one week of headache, N/V and diarrhea. She was discharged after successful PO challenge. She returned to her mother's house when her mother states she was not behaving normally, rubbing the comforter excessively, turing the light switch on and off repetitively and not verbally response so she returned to the ED 1/19. At that time a CT head revealed small volume subarachnoid hemorrhage and hyperdense dural sinuses with a filling defect concerning for dural venous thrombosis. 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 Neurology was consulted for further management.  Past Medical History:  Asthma Sickle cell trait DM  Significant Hospital Events:  Admitted 1/20  Consults:  PCCM IR  Procedures:  EEG 1/20- This study is suggestive of moderate diffuse encephalopathy, nonspecific etiology.  At 1531, patient was noted to have right upper extremity jerking.  Concomitant EEG showed high amplitude 2 to 3 Hz rhythmic delta slowing in bitemporal region with evolution in morphology and amplitude but without definite evolution in frequency concerning for focal motor seizure.  Significant Diagnostic Tests:   CT-scan of the brain IMPRESSION: 1. The study is positive for a small volume of subarachnoid hemorrhage. There is no midline shift. 2. Hyperdense dural sinuses and cortical veins especially on the right in addition to a filling defect within the transverse sinus  on the right is concerning for dural venous thrombosis and may be the source of the patient's subarachnoid hemorrhage. Emergent MRI/MRV is recommended for further evaluation.  MRI/MR venogram examination of the brain IMPRESSION: 1. Extensive, acute dural venous sinus thrombosis involving the superior sagittal sinus into the right internal jugular vein via the dominant right transverse and sigmoid system. Both veins of Trolard are thrombosed and there is straight sinus/deep cerebral thrombosis as well. 2. Deep watershed white matter infarcts, small volume subarachnoid hemorrhage, and left more than right frontoparietal cortical swelling due to #1.  Follow-up CT 1/22: cereberal edema same or slightly worse, clot burden has improved (personal review)   Interim History / Subjective:   Successful partial thrombectomy yesterday. Patient is to return tomorrow for further clot removal.  Objective   Blood pressure (!) 141/83, pulse (!) 53, temperature 98.9 F (37.2 C), temperature source Axillary, resp. rate 15, height 5\' 5"  (1.651 m), weight 68.4 kg, last menstrual period 02/03/2020, SpO2 100 %.    Vent Mode: PRVC FiO2 (%):  [30 %-100 %] 30 % Set Rate:  [16 bmp] 16 bmp Vt Set:  [450 mL] 450 mL PEEP:  [5 cmH20] 5 cmH20 Plateau Pressure:  [18 cmH20-27 cmH20] 19 cmH20   Intake/Output Summary (Last 24 hours) at 02/14/2020 1747 Last data filed at 02/14/2020 1745 Gross per 24 hour  Intake 3607.87 ml  Output 5300 ml  Net -1692.13 ml   Filed Weights   02/13/20 1101  Weight: 68.4 kg    Examination: General: Appears stated age comfortably lying in bed. Intubated and sedated. Lungs: Mechanically ventilated, no dyssynchrony, on minimal settings. Chest clear to auscultation. Cardiovascular: RRR, no murmurs Abdomen: BS+, soft, nondistended,  nontender Extremities: warm and dry, no LE edema Neuro: unable to follow commands, localizes to pain. Intact cranial nerves.   Resolved Hospital Problem  list     Assessment & Plan:   34yoF presented with AMS after one week of headache, n/v and diarrhea. CT head and MRI brain demonstrated small volume SAH and extensive dural venous sinus thrombosis.   Extensive dural venous thrombosis  Cerebral edema requiring titration of hypertonic saline Thought to be due to dehydration from GI illness and subsequently leading to congestion and causing SAH. Patient does have a history of sickle cell trait; hypercoagulable panel pending. On heparin gtt. IR to attempt aspiration/thrombectomy procedure today.  Expected postprocedural respiratory failure require mechanical ventilation and sedation.  Plan:  -Continue full ventilatory support in anticipation of further clot debulking and potential for worsening cerebral edema. -Continue IV heparin -Continue sedation at current level. -Target sodium 150-155   Best practice (evaluated daily)  Diet: npo Pain/Anxiety/Delirium protocol (if indicated): n/a VAP protocol (if indicated): n/a DVT prophylaxis: heparin GI prophylaxis: protonix Glucose control: SSI Mobility: bedrest Disposition:pending clinical improvement  Goals of Care:  Last date of multidisciplinary goals of care discussion: on admission Family and staff present: Brother at bedside Summary of discussion: full scope of care Follow up goals of care discussion due: n/a Code Status: Full code  Family member was updated at bedside today.  Labs   CBC: Recent Labs  Lab 02/10/20 2110 02/12/20 0127 02/13/20 0333 02/13/20 2058 02/14/20 0306 02/14/20 1111  WBC 11.7* 13.6* 13.3*  --   --  15.9*  NEUTROABS  --   --   --   --   --  9.8*  HGB 13.6 12.6 11.0* 7.5* 8.2* 8.6*  HCT 41.2 37.0 34.1* 22.0* 24.0* 27.7*  MCV 88.2 88.1 90.5  --   --  93.9  PLT 634* 578* 564*  --   --  575*    Basic Metabolic Panel: Recent Labs  Lab 02/10/20 2110 02/12/20 0127 02/13/20 1521 02/13/20 2058 02/14/20 0004 02/14/20 0306 02/14/20 0505  02/14/20 1111  NA 135 137   < > 154* 149* 156* 153* 156*  K 2.9* 3.5  --  2.8*  --  2.9* 2.9* 3.0*  CL 95* 103  --  118*  --   --  123* 124*  CO2 18* 20*  --   --   --   --  21* 24  GLUCOSE 150* 168*  --  94  --   --  120* 97  BUN 12 9  --  3*  --   --  <5* <5*  CREATININE 0.82 0.61  --  <0.20*  --   --  0.48 0.47  CALCIUM 9.4 9.3  --   --   --   --  7.8* 8.1*  MG  --   --   --   --   --   --   --  1.7  PHOS  --   --   --   --   --   --   --  2.0*   < > = values in this interval not displayed.   GFR: Estimated Creatinine Clearance: 95.4 mL/min (by C-G formula based on SCr of 0.47 mg/dL). Recent Labs  Lab 02/10/20 2110 02/12/20 0127 02/13/20 0333 02/14/20 1111  WBC 11.7* 13.6* 13.3* 15.9*    Liver Function Tests: Recent Labs  Lab 02/10/20 2110 02/12/20 0127  AST 32 16  ALT 42 30  ALKPHOS 58  52  BILITOT 1.6* 1.0  PROT 8.5* 8.3*  ALBUMIN 3.9 3.6   Recent Labs  Lab 02/10/20 2110  LIPASE 18   No results for input(s): AMMONIA in the last 168 hours.  ABG    Component Value Date/Time   PHART 7.485 (H) 02/14/2020 0306   PCO2ART 32.4 02/14/2020 0306   PO2ART 431 (H) 02/14/2020 0306   HCO3 24.6 02/14/2020 0306   TCO2 26 02/14/2020 0306   O2SAT 100.0 02/14/2020 0306     Coagulation Profile: Recent Labs  Lab 02/12/20 0417  INR 1.2    Cardiac Enzymes: No results for input(s): CKTOTAL, CKMB, CKMBINDEX, TROPONINI in the last 168 hours.  HbA1C: Hgb A1c MFr Bld  Date/Time Value Ref Range Status  02/13/2020 03:33 AM 4.9 4.8 - 5.6 % Final    Comment:    (NOTE) Pre diabetes:          5.7%-6.4%  Diabetes:              >6.4%  Glycemic control for   <7.0% adults with diabetes   02/12/2020 01:27 AM 4.9 4.8 - 5.6 % Final    Comment:    (NOTE) Pre diabetes:          5.7%-6.4%  Diabetes:              >6.4%  Glycemic control for   <7.0% adults with diabetes     CBG: Recent Labs  Lab 02/13/20 2323 02/14/20 0334 02/14/20 0740 02/14/20 1231  02/14/20 1600  GLUCAP 97 129* 100* 81 93   CRITICAL CARE Performed by: Kipp Brood   Total critical care time: 35 minutes  Critical care time was exclusive of separately billable procedures and treating other patients.  Critical care was necessary to treat or prevent imminent or life-threatening deterioration.  Critical care was time spent personally by me on the following activities: development of treatment plan with patient and/or surrogate as well as nursing, discussions with consultants, evaluation of patient's response to treatment, examination of patient, obtaining history from patient or surrogate, ordering and performing treatments and interventions, ordering and review of laboratory studies, ordering and review of radiographic studies, pulse oximetry, re-evaluation of patient's condition and participation in multidisciplinary rounds.  Kipp Brood, MD [facility] ICU Physician [place]  Pager: [phone] Mobile: [phone] After hours: [phone].

## 2020-02-14 NOTE — Progress Notes (Signed)
OG pushed in about 5 cm per the radiologist's suggestion. OG is hooked up to low intermittent suction at the time.

## 2020-02-15 ENCOUNTER — Inpatient Hospital Stay (HOSPITAL_COMMUNITY): Payer: 59 | Admitting: Anesthesiology

## 2020-02-15 ENCOUNTER — Encounter (HOSPITAL_COMMUNITY)
Admission: EM | Disposition: A | Discharge status: Rehabilitation Facility | Payer: Self-pay | Source: Home / Self Care | Attending: Neurology

## 2020-02-15 ENCOUNTER — Inpatient Hospital Stay (HOSPITAL_COMMUNITY): Payer: 59

## 2020-02-15 ENCOUNTER — Other Ambulatory Visit (HOSPITAL_COMMUNITY): Payer: PRIVATE HEALTH INSURANCE

## 2020-02-15 DIAGNOSIS — G9340 Encephalopathy, unspecified: Secondary | ICD-10-CM | POA: Diagnosis not present

## 2020-02-15 HISTORY — PX: IR ANGIO INTRA EXTRACRAN SEL INTERNAL CAROTID UNI R MOD SED: IMG5362

## 2020-02-15 HISTORY — PX: IR THROMBECT VENO MECH REPT MOD SED: IMG2301

## 2020-02-15 HISTORY — PX: IR CT HEAD LTD: IMG2386

## 2020-02-15 HISTORY — PX: RADIOLOGY WITH ANESTHESIA: SHX6223

## 2020-02-15 HISTORY — PX: IR ANGIO INTRA EXTRACRAN SEL COM CAROTID INNOMINATE UNI R MOD SED: IMG5359

## 2020-02-15 HISTORY — PX: IR PTA VENOUS EXCEPT DIALYSIS CIRCUIT: IMG6126

## 2020-02-15 LAB — BASIC METABOLIC PANEL
Anion gap: 10 (ref 5–15)
Anion gap: 8 (ref 5–15)
BUN: <5 mg/dL — ABNORMAL LOW (ref 6–20)
BUN: 5 mg/dL — ABNORMAL LOW (ref 6–20)
CO2: 27 mmol/L (ref 22–32)
CO2: 26 mmol/L (ref 22–32)
Calcium: 8.6 mg/dL — ABNORMAL LOW (ref 8.9–10.3)
Calcium: 8.7 mg/dL — ABNORMAL LOW (ref 8.9–10.3)
Chloride: 116 mmol/L — ABNORMAL HIGH (ref 98–111)
Chloride: 116 mmol/L — ABNORMAL HIGH (ref 98–111)
Creatinine, Ser: 0.42 mg/dL — ABNORMAL LOW (ref 0.44–1.00)
Creatinine, Ser: 0.41 mg/dL — ABNORMAL LOW (ref 0.44–1.00)
GFR, Estimated: >60 mL/min (ref >60)
GFR, Estimated: >60 mL/min (ref >60)
Glucose, Bld: 103 mg/dL — ABNORMAL HIGH (ref 70–99)
Glucose, Bld: 138 mg/dL — ABNORMAL HIGH (ref 70–99)
Potassium: 3.2 mmol/L — ABNORMAL LOW (ref 3.5–5.1)
Potassium: 3.1 mmol/L — ABNORMAL LOW (ref 3.5–5.1)
Sodium: 153 mmol/L — ABNORMAL HIGH (ref 135–145)
Sodium: 150 mmol/L — ABNORMAL HIGH (ref 135–145)

## 2020-02-15 LAB — HEPARIN LEVEL (UNFRACTIONATED)
Heparin Unfractionated: 0.36 IU/mL (ref 0.30–0.70)
Heparin Unfractionated: 0.15 IU/mL — ABNORMAL LOW (ref 0.30–0.70)
Heparin Unfractionated: 2.14 IU/mL — ABNORMAL HIGH (ref 0.30–0.70)
Heparin Unfractionated: 0.83 IU/mL — ABNORMAL HIGH (ref 0.30–0.70)

## 2020-02-15 LAB — CBC
HCT: 26.6 % — ABNORMAL LOW (ref 36.0–46.0)
Hemoglobin: 8.2 g/dL — ABNORMAL LOW (ref 12.0–15.0)
MCH: 29.2 pg (ref 26.0–34.0)
MCHC: 30.8 g/dL (ref 30.0–36.0)
MCV: 94.7 fL (ref 80.0–100.0)
Platelets: 536 10*3/uL — ABNORMAL HIGH (ref 150–400)
RBC: 2.81 MIL/uL — ABNORMAL LOW (ref 3.87–5.11)
RDW: 14.2 % (ref 11.5–15.5)
WBC: 14 10*3/uL — ABNORMAL HIGH (ref 4.0–10.5)
nRBC: 0 % (ref 0.0–0.2)

## 2020-02-15 LAB — POCT I-STAT, CHEM 8
BUN: <3 mg/dL — ABNORMAL LOW (ref 6–20)
Calcium, Ion: 1.21 mmol/L (ref 1.15–1.40)
Chloride: 114 mmol/L — ABNORMAL HIGH (ref 98–111)
Creatinine, Ser: 0.2 mg/dL — ABNORMAL LOW (ref 0.44–1.00)
Glucose, Bld: 96 mg/dL (ref 70–99)
HCT: 21 % — ABNORMAL LOW (ref 36.0–46.0)
Hemoglobin: 7.1 g/dL — ABNORMAL LOW (ref 12.0–15.0)
Potassium: 2.6 mmol/L — CL (ref 3.5–5.1)
Sodium: 154 mmol/L — ABNORMAL HIGH (ref 135–145)
TCO2: 26 mmol/L (ref 22–32)

## 2020-02-15 LAB — GLUCOSE, CAPILLARY
Glucose-Capillary: 93 mg/dL (ref 70–99)
Glucose-Capillary: 84 mg/dL (ref 70–99)
Glucose-Capillary: 101 mg/dL — ABNORMAL HIGH (ref 70–99)
Glucose-Capillary: 142 mg/dL — ABNORMAL HIGH (ref 70–99)

## 2020-02-15 LAB — SODIUM
Sodium: 156 mmol/L — ABNORMAL HIGH (ref 135–145)
Sodium: 149 mmol/L — ABNORMAL HIGH (ref 135–145)

## 2020-02-15 LAB — MAGNESIUM
Magnesium: 1.7 mg/dL (ref 1.7–2.4)
Magnesium: 1.6 mg/dL — ABNORMAL LOW (ref 1.7–2.4)

## 2020-02-15 LAB — TROPONIN I (HIGH SENSITIVITY)
Troponin I (High Sensitivity): 4 ng/L (ref <18)
Troponin I (High Sensitivity): 5 ng/L (ref <18)

## 2020-02-15 LAB — PHOSPHORUS
Phosphorus: 1.9 mg/dL — ABNORMAL LOW (ref 2.5–4.6)
Phosphorus: 2.7 mg/dL (ref 2.5–4.6)

## 2020-02-15 LAB — POCT ACTIVATED CLOTTING TIME: Activated Clotting Time: 231 seconds

## 2020-02-15 IMAGING — MR MR HEAD W/O CM
12 of 13 series · 44 of 48 positions shown · non-contrast
Comparison: Brain MRI from 3 days ago

CLINICAL DATA: Follow-up dural sinus thrombosis. Interval heparin
drip and mechanical thrombectomy.

EXAM:
MRI HEAD WITHOUT CONTRAST
MRV HEAD WITHOUT CONTRAST
TECHNIQUE: Multiplanar, multiecho pulse sequences of the brain and surrounding
structures were obtained without intravenous contrast. Angiographic
images of the intracranial venous structures were obtained using MRV
technique without intravenous contrast.

[Series 5: DWI · axial · 3.0mm · 0.88mm/px · z∈[-147,-8]mm · 8 of 96 slices shown (1 of 4)]
[im 1/96]
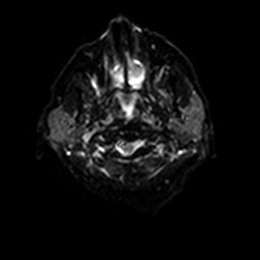
[im 14/96]
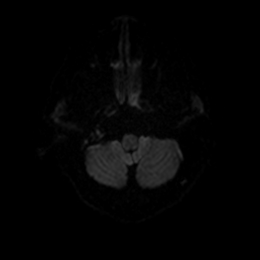
[im 28/96]
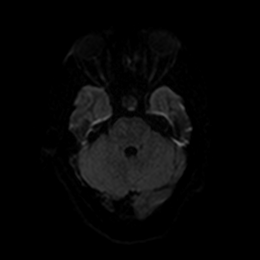
[im 41/96]
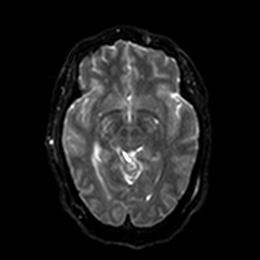
[im 55/96]
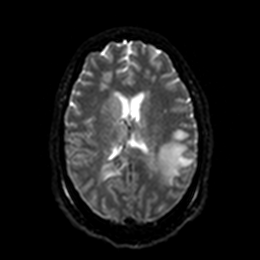
[im 68/96]
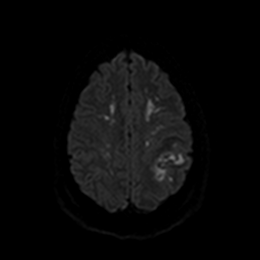
[im 82/96]
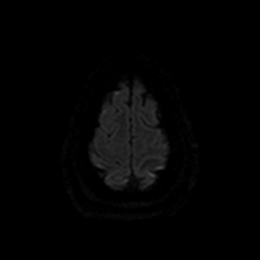
[im 96/96]
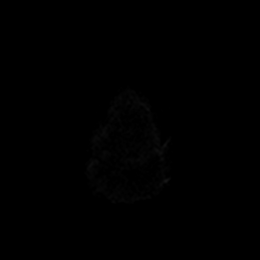

[Series 6: DWI · axial · 3.0mm · 0.88mm/px · z∈[-147,-8]mm · 4 of 48 slices shown (2 of 4)]
[im 1/48]
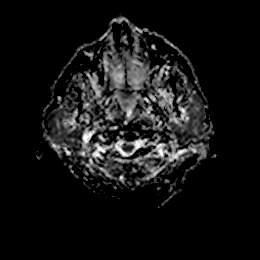
[im 16/48]
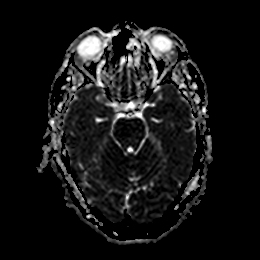
[im 32/48]
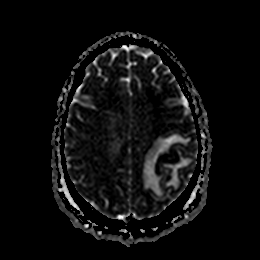
[im 48/48]
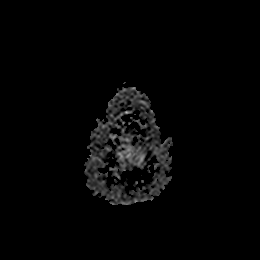

[Series 7: DWI · coronal · 4.0mm · 0.88mm/px · 5 of 70 slices shown (3 of 4)]
[im 1/70]
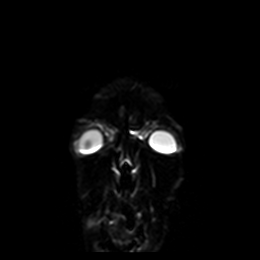
[im 18/70]
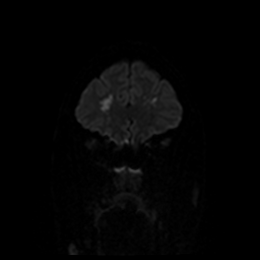
[im 35/70]
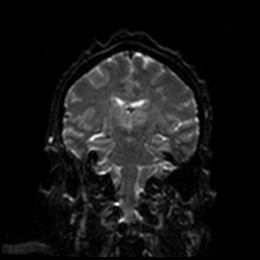
[im 52/70]
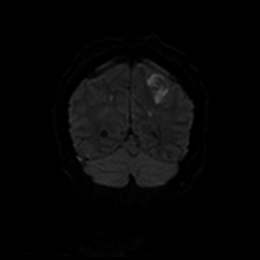
[im 70/70]
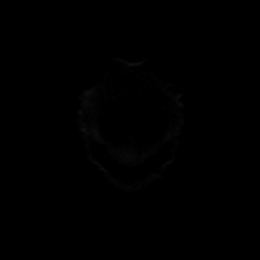

[Series 8: DWI · coronal · 4.0mm · 0.88mm/px · 3 of 35 slices shown (4 of 4)]
[im 1/35]
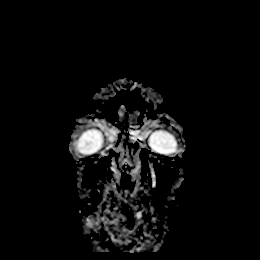
[im 18/35]
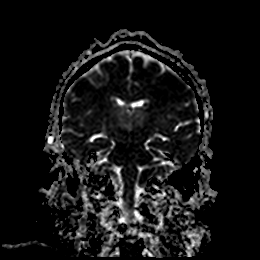
[im 35/35]
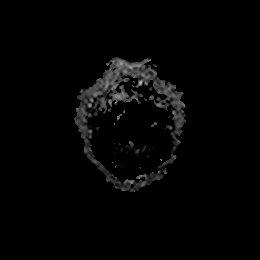

[Series 9: T1 · sagittal · 5.0mm · 0.72mm/px · 2 of 23 slices shown]
[im 1/23]
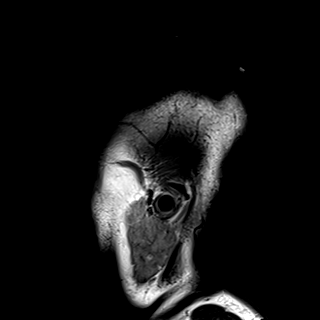
[im 23/23]
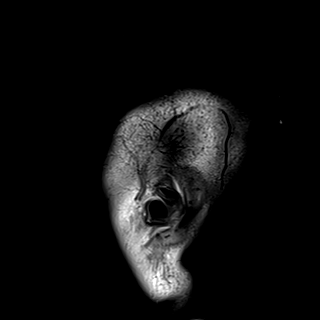

[Series 10: T2 · axial · 5.0mm · 0.72mm/px · z∈[-150,-8]mm · 2 of 25 slices shown (1 of 2)]
[im 1/25]
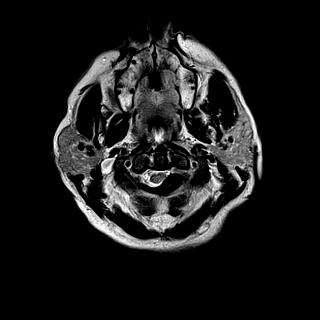
[im 25/25]
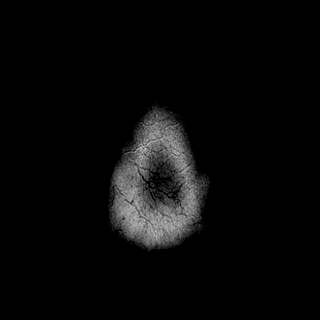

[Series 11: FLAIR · axial · 5.0mm · 0.45mm/px · z∈[-147,-5]mm · 2 of 25 slices shown]
[im 1/25]
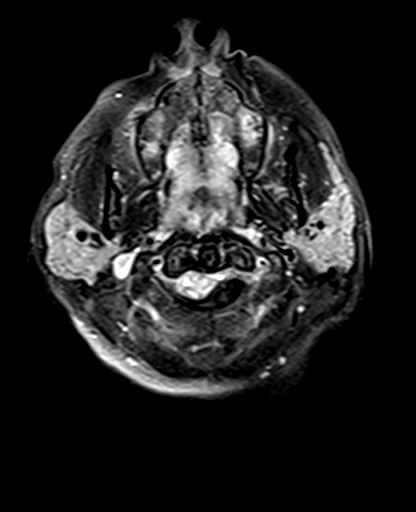
[im 25/25]
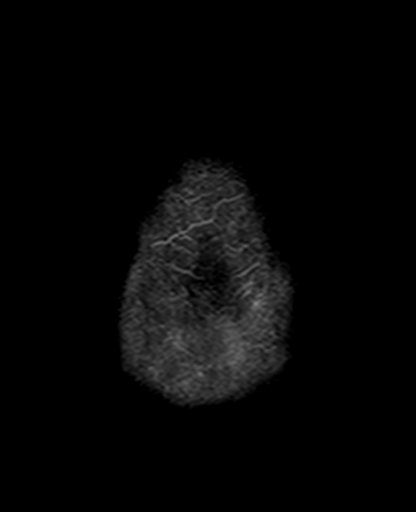

[Series 12: mag_images · axial · 3.0mm · 0.90mm/px · z∈[-157,+5]mm · 4 of 56 slices shown]
[im 1/56]
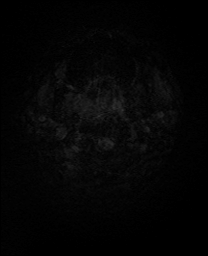
[im 19/56]
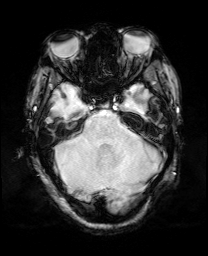
[im 37/56]
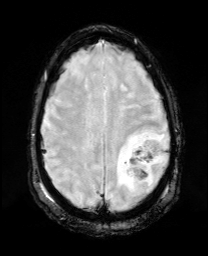
[im 56/56]
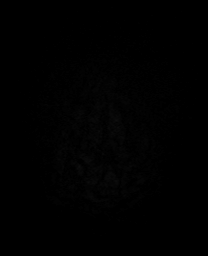

[Series 13: pha_images · axial · 3.0mm · 0.90mm/px · z∈[-157,+5]mm · 4 of 55 slices shown]
[im 1/55]
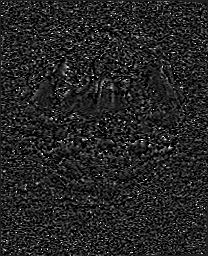
[im 19/55]
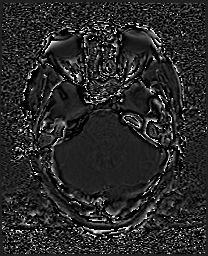
[im 37/55]
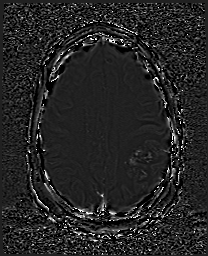
[im 55/55]
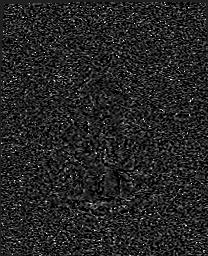

[Series 14: swi_images · axial · 3.0mm · 0.90mm/px · z∈[-157,+5]mm · 4 of 56 slices shown]
[im 1/56]
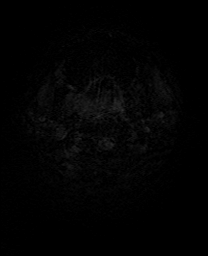
[im 19/56]
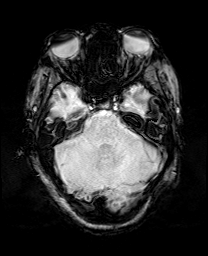
[im 37/56]
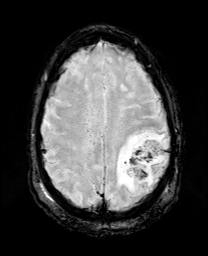
[im 56/56]
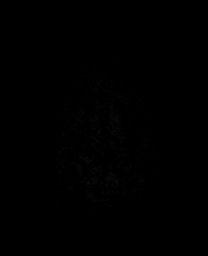

[Series 15: mip_images(sw) · axial · 24.0mm · 0.90mm/px · z∈[-147,-5]mm · 4 of 49 slices shown]
[im 1/49]
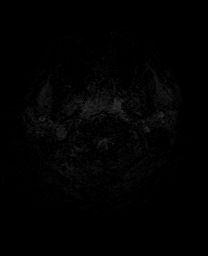
[im 17/49]
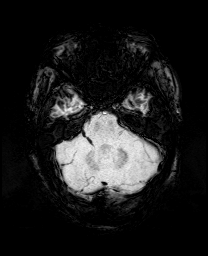
[im 33/49]
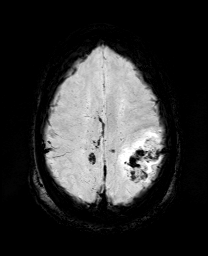
[im 49/49]
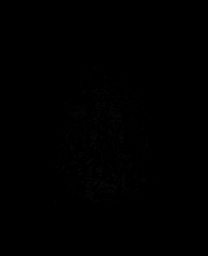

[Series 17: T2 · coronal · 5.0mm · 0.34mm/px · 2 of 30 slices shown (2 of 2)]
[im 1/30]
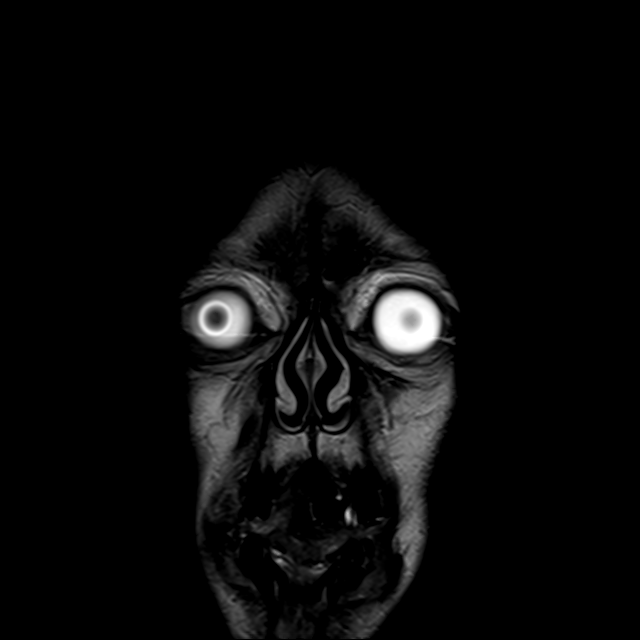
[im 30/30]
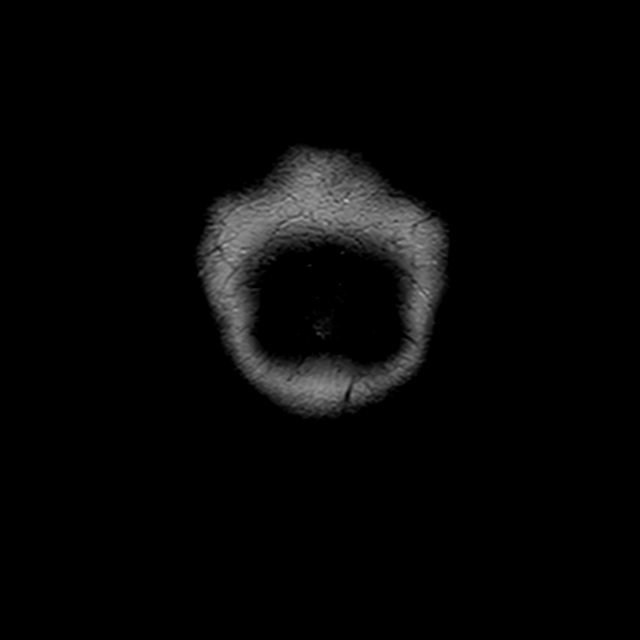

[44 of 48 positions shown; findings below may reference images not displayed]

FINDINGS: MRI HEAD WITHOUT CONTRAST

Brain: Bilateral deep watershed white matter infarcts which appears
similar in extent. The degree of restricted diffusion appears
greater in these infarcts, compatible with interval evolution.
Venous infarct along the high left central sulcus is progressed in
extent. Small infarct in the right paramedian splenium of the corpus
callosum with mild increase. T2 signal without restricted diffusion
in the right more than left basal ganglia and in the bilateral
thalamus, progressed. There is increased FLAIR signal along the
cerebral convexities which may be from engorged vessels or the
subarachnoid blood seen on admission CT. On gradient imaging there
is less vessel congestion suspected. Increased petechial hemorrhage
along the left-sided venous infarct.

Vascular: T1 hyperintense evolution of the dural venous sinus
thrombosis which is most confluent in the superior sagittal sinus,
right transverse sinus, and right sigmoid sinus. Clot is also seen
in the bilateral vein of Trolard.

Skull and upper cervical spine: Normal marrow signal

Sinuses/Orbits: Negative

MR VENOGRAM WITHOUT CONTRAST

There has been improvement of venous flow after thrombectomy.
Improvements are primarily seen at the level of the torcula and
proximal right transverse sinus. There is also now straight sinus
and internal cerebral vein flow. Occlusive thrombus is still seen at
the level of the superior sagittal sinus, distal right transverse,
and right sigmoid sinuses. Veins of Trolard remain thrombosed, best
seen by noncontrast T1 weighted imaging.
IMPRESSION: Brain MRI:

1. Progressive left perirolandic venous infarction and petechial
hemorrhage.
2. New edema in the deep gray nuclei without infarction.

Intracranial MRV:

1. Improved flow especially in the deep venous system. Improved flow
also seen in the proximal right transverse sinus.
2. Occlusive clot still seen in the superior sagittal, distal right
transverse, and right sigmoid dural sinuses. No recanalization of
the veins of Trolard.

## 2020-02-15 IMAGING — XA IR THROMBOECTOMY MECHANICAL VENOUS SUBSEQUENT DAY
3 series · 11 of 24 positions shown · non-contrast
Comparison: MRI MRV [DATE].

INDICATION: Stuporous.  Dural venous sinus thrombosis.

EXAM:
1. EMERGENT LARGE VESSEL OCCLUSION THROMBOLYSIS (POSTERIOR
CIRCULATION)
TECHNIQUE: Following a full explanation of the procedure along with the
potential associated complications, an informed witnessed consent
was obtained. The risks of intracranial hemorrhage of 10%, worsening
neurological deficit, ventilator dependency, death and inability to
revascularize were all reviewed in detail with the patient's mother.

[Series 29: <mpr range> · coronal · 5.0mm · 0.47mm/px · 1 of 32 slices shown (1 of 2)]
[im 32/32]
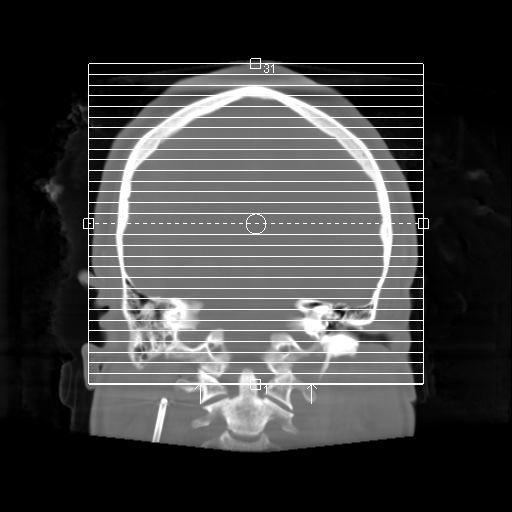

[Series 29: <mpr range> · axial · 5.0mm · 0.39mm/px · 1 of 38 slices shown (2 of 2)]
[im 19/38]
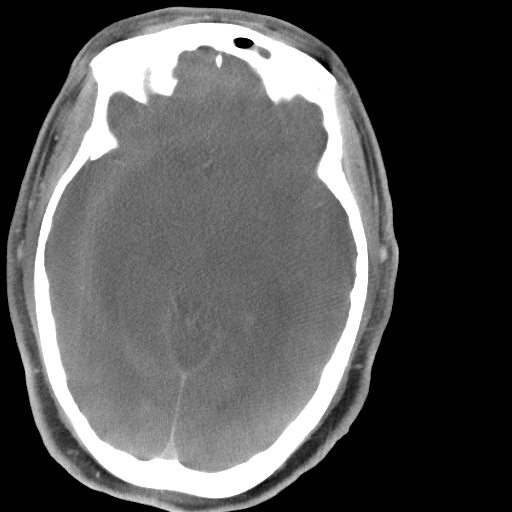

[Series 300: ir (person_name) · 9 of 303 slices shown]
[im 1/303]
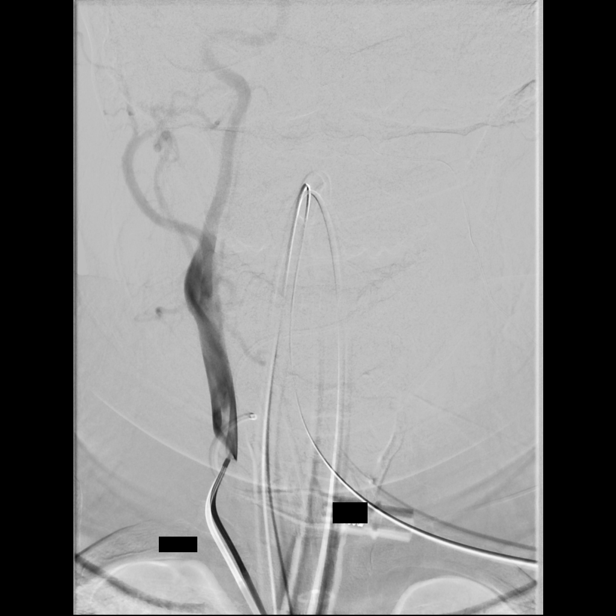
[im 34/303]
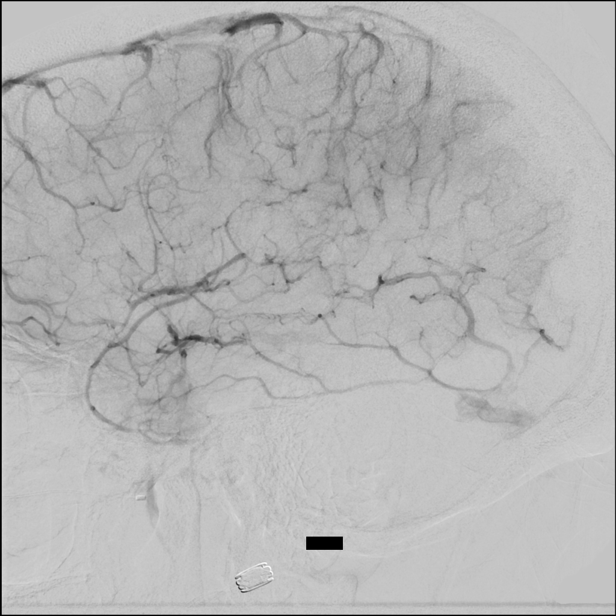
[im 68/303]
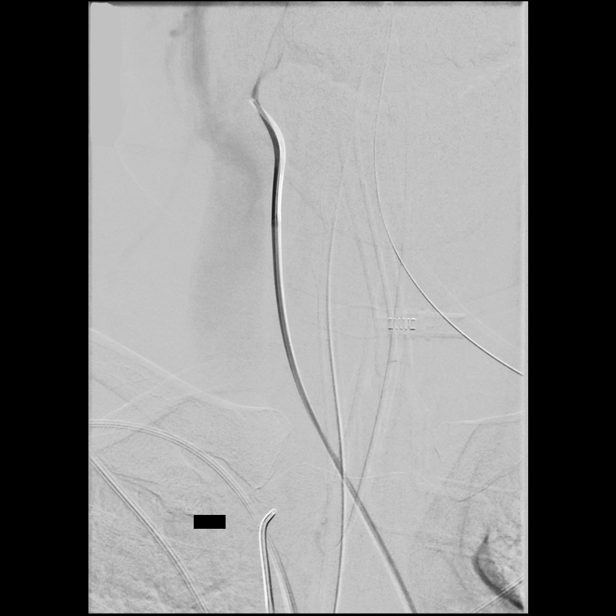
[im 118/303]
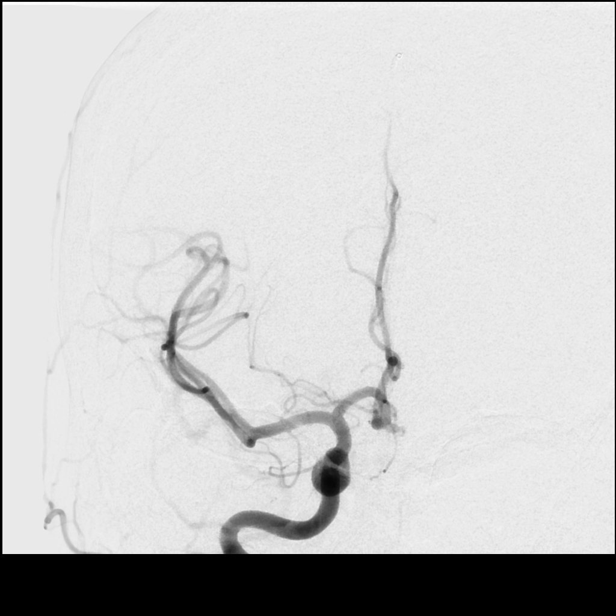
[im 152/303]
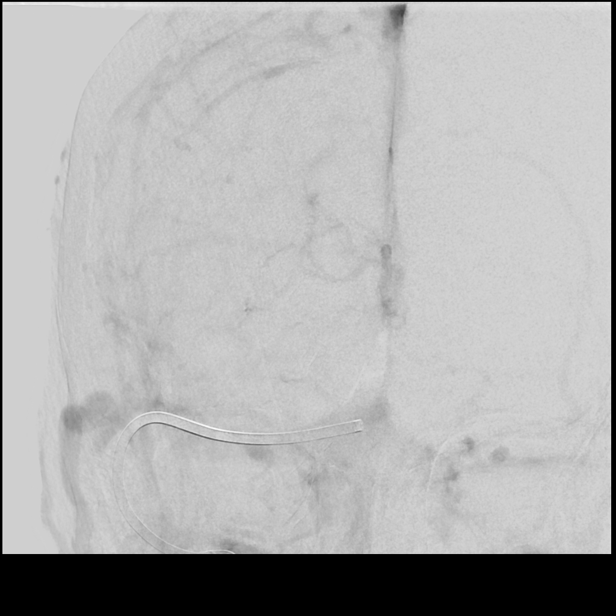
[im 185/303]
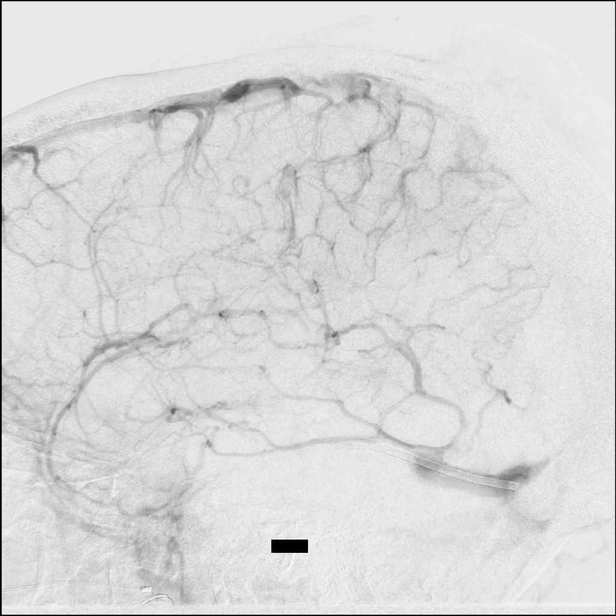
[im 219/303]
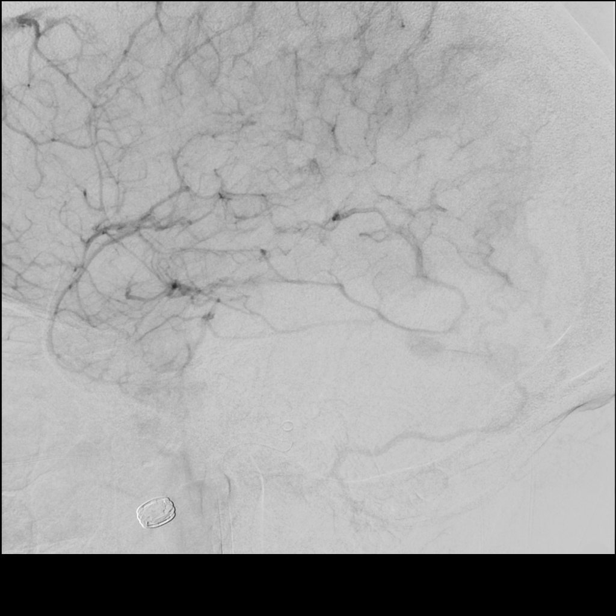
[im 252/303]
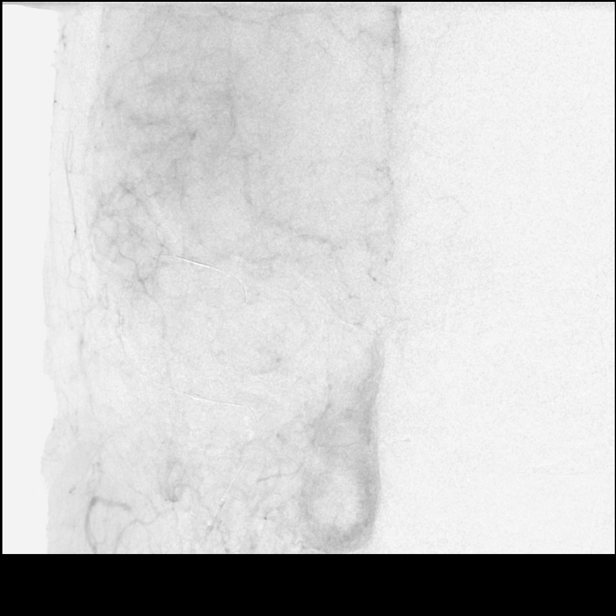
[im 286/303]
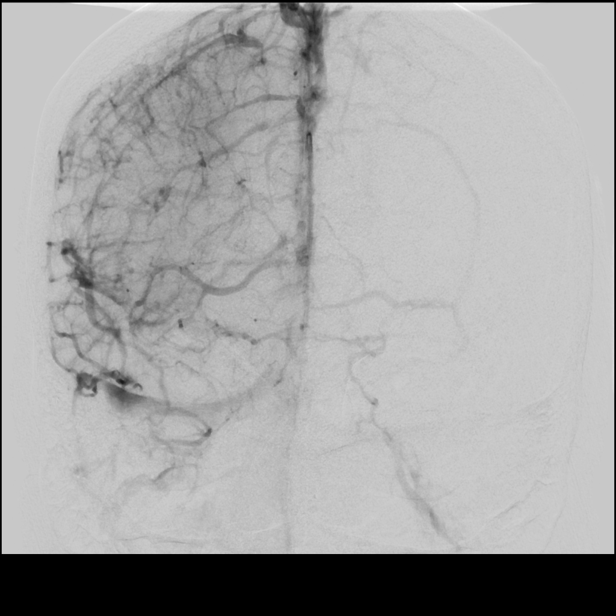

[11 of 24 positions shown; findings below may reference images not displayed]

MEDICATIONS:
Ancef 2 g IV antibiotic was administered within 1 hour of the
procedure.

ANESTHESIA/SEDATION:
General anesthesia

CONTRAST:  Isovue 300 100 mL

FLUOROSCOPY TIME:  Fluoroscopy Time: 96 minutes 12 seconds ([7V]
mGy).

COMPLICATIONS:
None immediate.
The patient was then put under general anesthesia by the [REDACTED] at [HOSPITAL].

The in situ right common femoral vein 8 French, and in situ left
common femoral arterial French sheath were exchanged over a
inch guidewire for a new sheath which were then connected to
continuous heparinized saline infusion.

Over a 0.035 inch Roadrunner guidewire, a 5 French JB 1 catheter was
advanced to the aortic arch region and positioned in the right
common carotid artery.

Through the right common femoral vein, over a 0.035 inch Roadrunner
guidewire, a 5 French catheter was administered to the right
internal jugular vein to the skull base. This in turn was then
exchanged over a 0.035 inch 300 cm Rosen exchange guidewire for an 8
French 80 cm Neuron Max sheath. The guidewire was removed. Good
aspiration obtained from the hub of the Neuron Max sheath. A gentle
control arteriogram performed through this demonstrates safe
position of the tip of the Neuron Max sheath. This was then
connected to continuous heparinized saline infusion.
FINDINGS: The right common carotid arteriogram demonstrates the right external
carotid artery and its major branches to be widely patent.

The right internal carotid artery at the bulb to the cranial skull
base is widely patent.

The petrous, cavernous and supraclinoid segments are also widely
patent.

The right middle cerebral artery and the right anterior cerebral
opacify into the capillary and venous phases.

The venous phase demonstrates again significantly decreased venous
congestion overlying both cerebral convexities. Patency is noted of
the right transverse sinus to the right transverse sinus sigmoid
sinus junction.

This vein of Labbe and the posterior temporal vein are patent
draining into venous channel running parallel to the to the occluded
right internal jugular vein.

Patency is maintained at the torcula, the left transverse sinus, the
left sigmoid sinus and the left internal jugular vein.

PROCEDURE:
An 071 95 cm Benchmark catheter was advanced through the Neuron Max
guide sheath to the left internal jugular occluded vein.

An 035 inch Roadrunner wire was first advanced without difficulty
into the right sigmoid sinus and the distal right transverse sinus.
However, advancement of the 6 French Benchmark was met with
significant resistance.

At this time, in a coaxial manner and with constant heparinized
saline infusion over a 016 inch Fathom micro guidewire, a Marksman
027 microcatheter was advanced without difficulty to the torcula.
The guidewire was removed. Control arteriogram was then performed
from the proximal right transverse sinus. This confirmed brisk flow
through the right transverse and left transverse sinuses.

This also demonstrated the partially occluded on the torcula. The
016 micro guidewire was reintroduced, and the torque device was
advanced without difficulty into the distal [DATE] of the superior
sagittal sinus and eventually into the junction of the anterior and
the middle [DATE]. Micro guidewire was removed. Good aspiration was
obtained from the hub of the microcatheter. A gentle control
arteriogram performed through this demonstrated extensive occlusion
of the anterior [DATE] of the superior sagittal sinus.

Through the microcatheter, a 6 mm x 40 mm Solitaire X retrieval
device was advanced and deployed through the microcatheter. The
microcatheter was then gently retrieved. The 6 French Benchmark
guide catheter was then advanced to just inside the torcula having
the hub at the right groin.

At this time as constant aspiration was applied through the
Benchmark, the retrieval device and the microcatheter were retrieved
and removed. Copious amounts of clot were seen in the canister, and
also in the retrieval device. Three further passes were made with a
similar arrangement resulting in more clot being removed.

A final pass was then made through a 071 136 cm Zoom aspiration
catheter which replaced the Benchmark catheter. This could be
advanced more distally to the anterior third of the superior
sagittal sinus.

Constant aspiration was applied as the aspiration catheter was
retrieved slowly more proximally and into the right transverse
sinus.

Again more clot was obtained especially in the canister.

A control arteriogram performed through the diagnostic catheter in
the right common carotid artery into the venous phase now
demonstrated complete recanalization of the superior sagittal sinus,
the right transverse sinus, the left transverse sinus, the left
sigmoid sinus and the left internal jugular vein.

Gentle control injection performed through the Zoom aspiration
catheter in the mid right transverse sinus demonstrated significant
stenosis of the right transverse sinus sigmoid sinus junction.

Also noted was severely contracted right sigmoid sinus to the
internal jugular vein.

At this time, a 8 mm x 40 mm SHARMIN balloon angioplasty catheter
was advanced over a 0.014 inch exchange micro guidewire. Balloon
angioplasty was then performed extending from the distal [DATE] of the
transverse sinus on the right side, the transverse sinus sigmoid
sinus junction, and the sigmoid sinus. At the end of this, a control
arteriogram performed in the venous phase demonstrated marginally
improved flow through the right sigmoid sinus into the now patent
right internal jugular vein.

However, there continued be prominent collateral just lateral to
this at the level of the transverse sinus sigmoid sinus junction
draining the posterior temporal vein, and the vein of Labbe. There
continued be stenosis at the right transverse sinus sigmoid sinus
junction partially improved.

As venous grafts continued through superior sagittal sinus and
transverse sinuses and the right sigmoid sinus and through a
prominent collateral just into the right sigmoid sinus, it was
elected to stop.

071 Zoom aspiration catheter, and the Neuron Max sheath were removed
with hemostasis achieved at the right groin venous puncture site.
The left femoral sheath was then removed and hemostasis achieved
with a 5 French ExoSeal closure device. Distal pulses remained
palpable.

A CT of the brain demonstrated no gross subarachnoid hemorrhage or
mass effect or midline shift.

The patient was left intubated and transferred to the neuro ICU.
IMPRESSION: Status post endovascular complete revascularization of entire
superior sagittal sinus, the right transverse sinus, the left
transverse sinus, the left sigmoid sinus, the left internal jugular
vein, and partially the right sigmoid sinus and right internal
jugular vein using the aspiration, and stent retrieval technique as
described.

PLAN:
Follow-up in the clinic 4 to 6 weeks post discharge.

## 2020-02-15 IMAGING — MR MR [PERSON_NAME] HEAD
6 series · 37 of 48 positions shown · non-contrast
Comparison: Brain MRI from 3 days ago

CLINICAL DATA: Follow-up dural sinus thrombosis. Interval heparin
drip and mechanical thrombectomy.

EXAM:
MRI HEAD WITHOUT CONTRAST
MRV HEAD WITHOUT CONTRAST
TECHNIQUE: Multiplanar, multiecho pulse sequences of the brain and surrounding
structures were obtained without intravenous contrast. Angiographic
images of the intracranial venous structures were obtained using MRV
technique without intravenous contrast.

[Series 5: tof_fl2d_paracor · coronal · 2.0mm · 0.98mm/px · 7 of 132 slices shown]
[im 1/132]
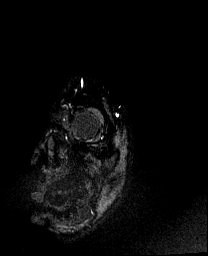
[im 22/132]
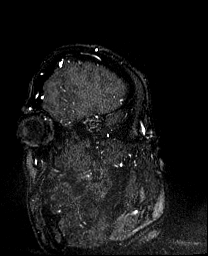
[im 44/132]
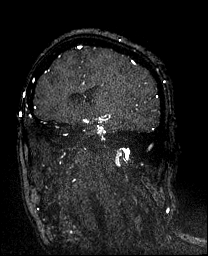
[im 66/132]
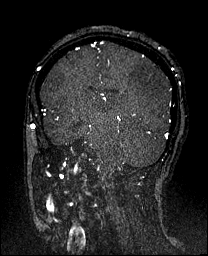
[im 88/132]
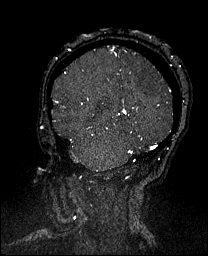
[im 110/132]
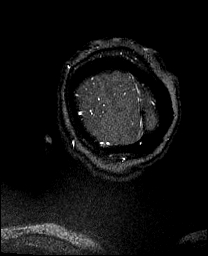
[im 132/132]
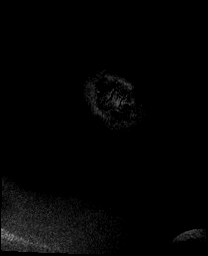

[Series 9: venous inhance coronal · coronal · portal-venous · 0.9mm · 0.57mm/px · 8 of 160 slices shown]
[im 1/160]
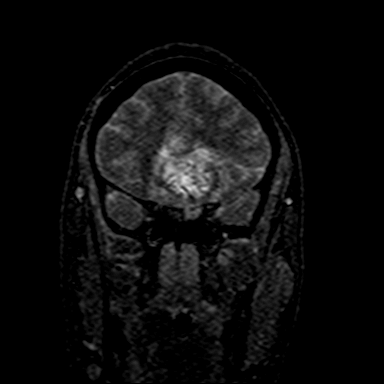
[im 18/160]
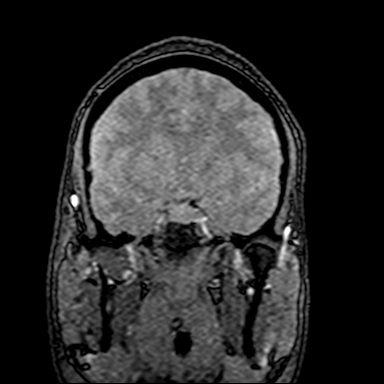
[im 54/160]
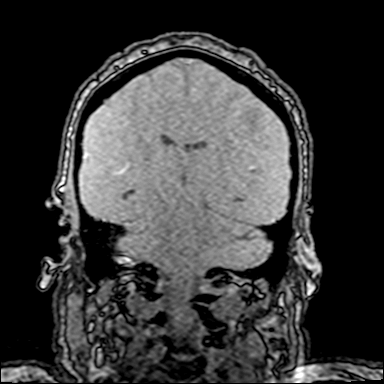
[im 71/160]
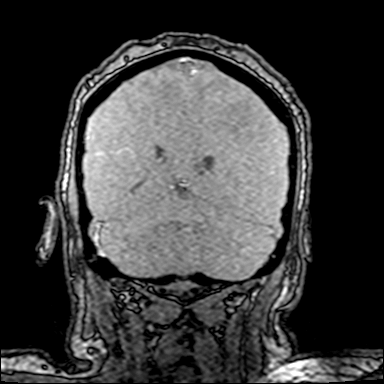
[im 89/160]
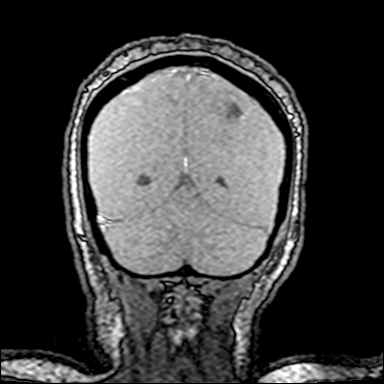
[im 107/160]
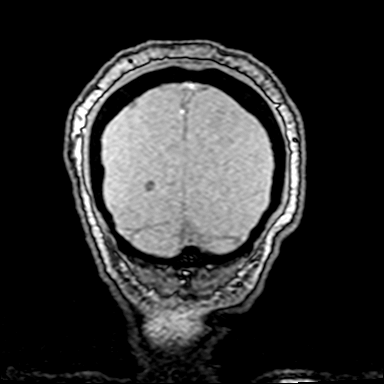
[im 142/160]
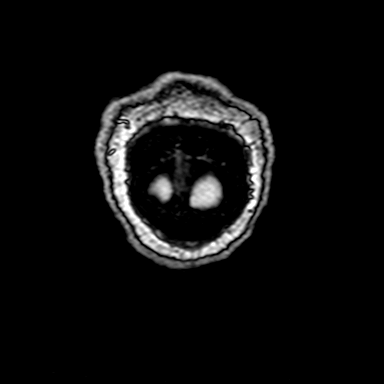
[im 160/160]
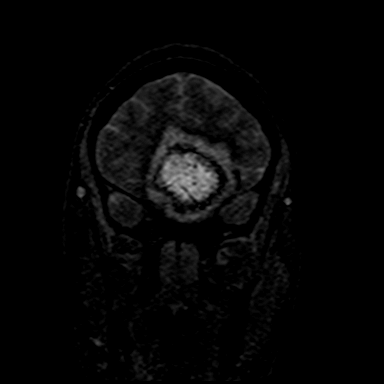

[Series 10: venous inhance coronal_msum · coronal · portal-venous · 0.9mm · 0.57mm/px · 8 of 160 slices shown]
[im 1/160]
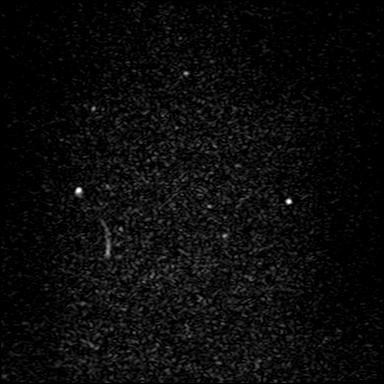
[im 18/160]
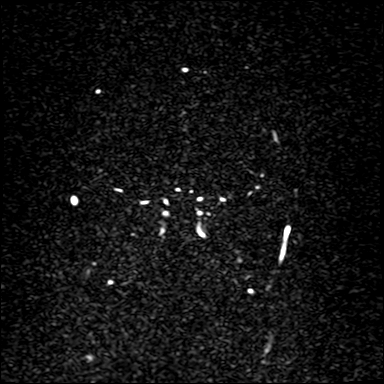
[im 54/160]
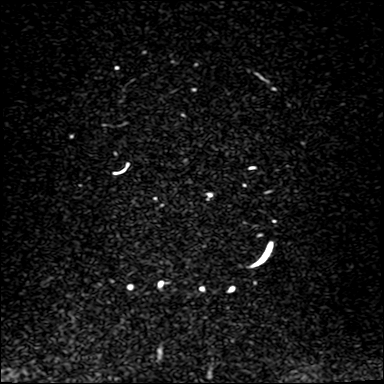
[im 71/160]
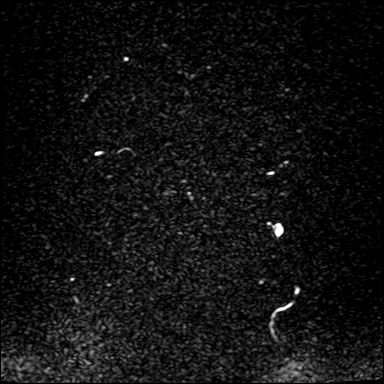
[im 89/160]
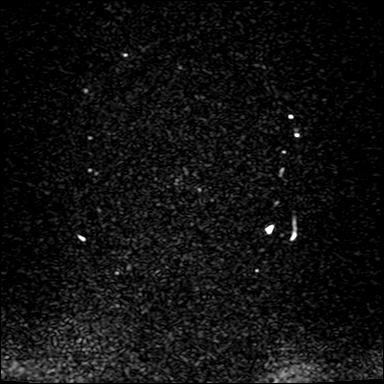
[im 107/160]
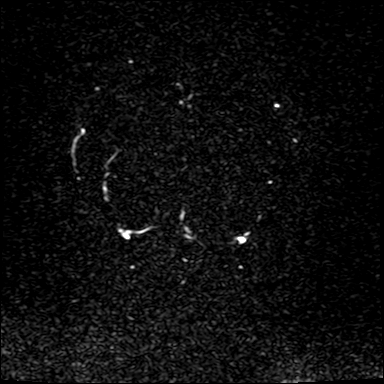
[im 142/160]
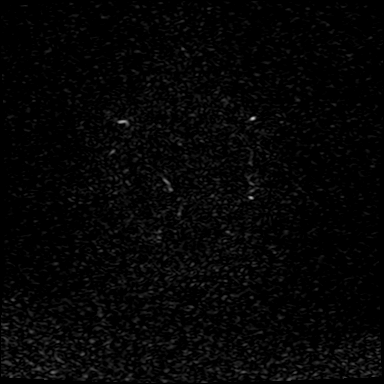
[im 160/160]
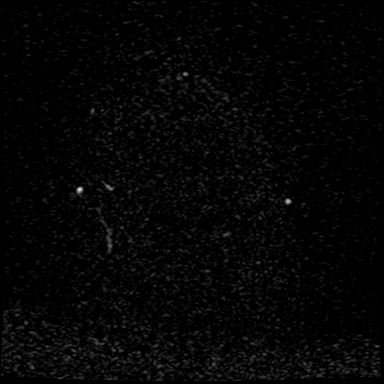

[Series 13: t1_fl3d_sag_p2_iso · sagittal · 1.0mm · 0.98mm/px · 8 of 160 slices shown]
[im 1/160]
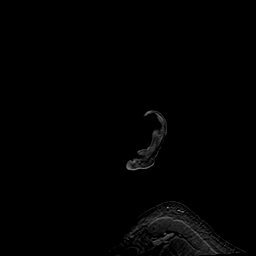
[im 18/160]
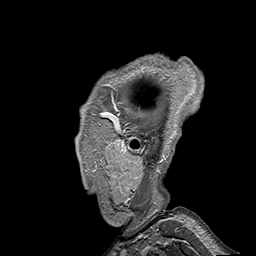
[im 54/160]
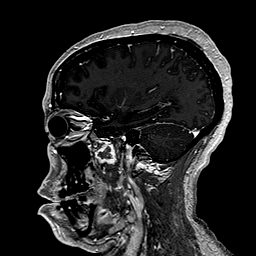
[im 71/160]
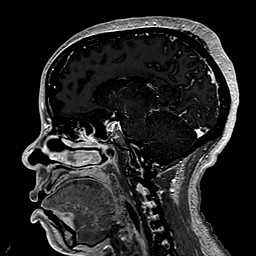
[im 89/160]
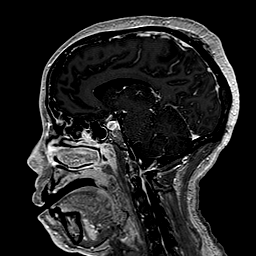
[im 107/160]
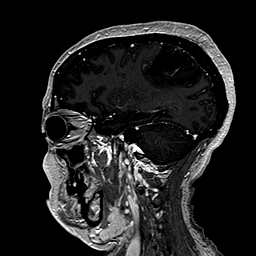
[im 142/160]
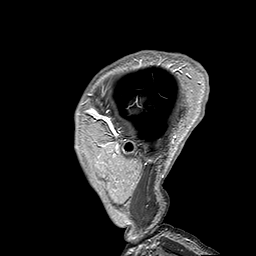
[im 160/160]
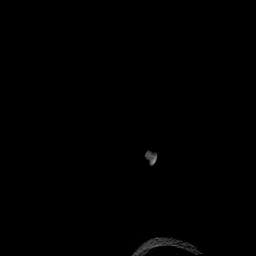

[Series 14: t1_fl3d_sag_p2_iso_mpr_ axial · axial · 2.0mm · 0.42mm/px · z∈[-162,+5]mm · 5 of 86 slices shown]
[im 1/86]
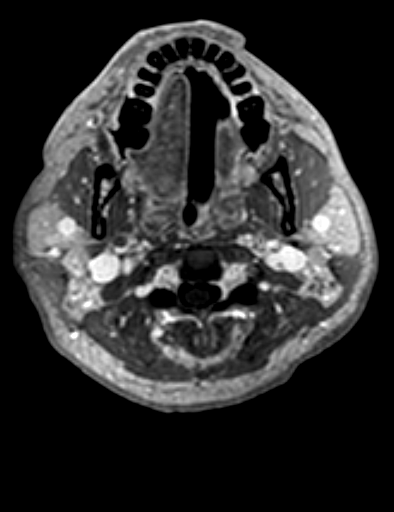
[im 22/86]
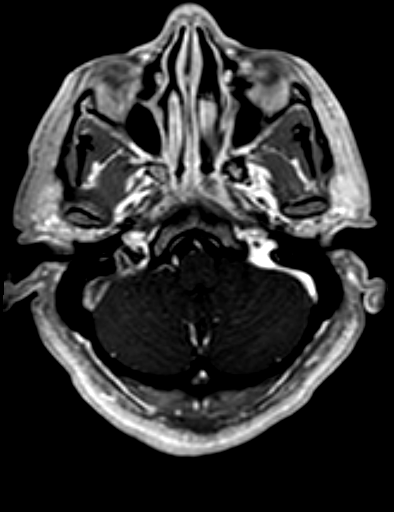
[im 43/86]
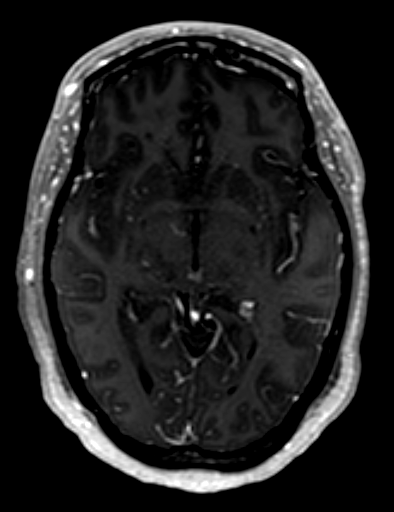
[im 64/86]
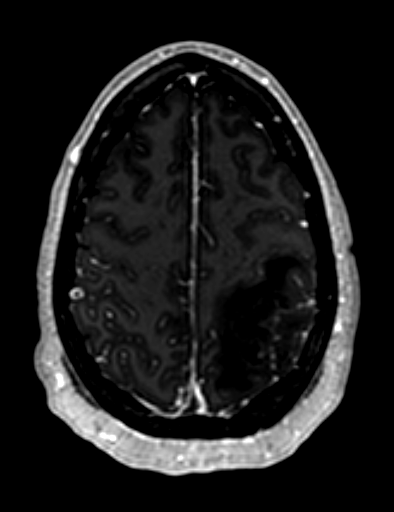
[im 86/86]
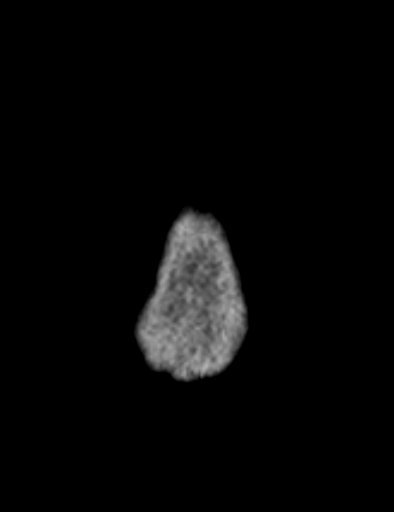

[Series 15: t1_fl3d_sag_p2_iso_mpr_coronal · coronal · 2.0mm · 0.45mm/px · 1 of 94 slices shown]
[im 1/94]
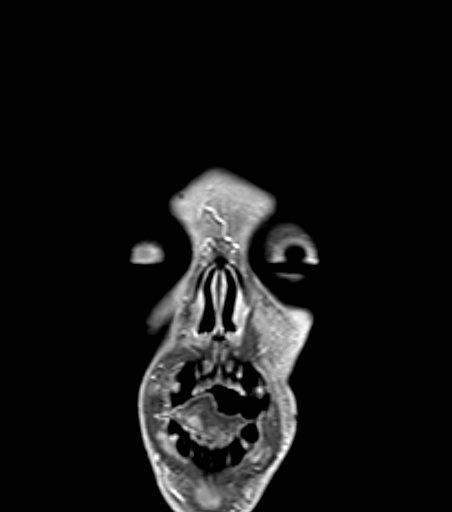

[37 of 48 positions shown; findings below may reference images not displayed]

FINDINGS: MRI HEAD WITHOUT CONTRAST

Brain: Bilateral deep watershed white matter infarcts which appears
similar in extent. The degree of restricted diffusion appears
greater in these infarcts, compatible with interval evolution.
Venous infarct along the high left central sulcus is progressed in
extent. Small infarct in the right paramedian splenium of the corpus
callosum with mild increase. T2 signal without restricted diffusion
in the right more than left basal ganglia and in the bilateral
thalamus, progressed. There is increased FLAIR signal along the
cerebral convexities which may be from engorged vessels or the
subarachnoid blood seen on admission CT. On gradient imaging there
is less vessel congestion suspected. Increased petechial hemorrhage
along the left-sided venous infarct.

Vascular: T1 hyperintense evolution of the dural venous sinus
thrombosis which is most confluent in the superior sagittal sinus,
right transverse sinus, and right sigmoid sinus. Clot is also seen
in the bilateral vein of Trolard.

Skull and upper cervical spine: Normal marrow signal

Sinuses/Orbits: Negative

MR VENOGRAM WITHOUT CONTRAST

There has been improvement of venous flow after thrombectomy.
Improvements are primarily seen at the level of the torcula and
proximal right transverse sinus. There is also now straight sinus
and internal cerebral vein flow. Occlusive thrombus is still seen at
the level of the superior sagittal sinus, distal right transverse,
and right sigmoid sinuses. Veins of Trolard remain thrombosed, best
seen by noncontrast T1 weighted imaging.
IMPRESSION: Brain MRI:

1. Progressive left perirolandic venous infarction and petechial
hemorrhage.
2. New edema in the deep gray nuclei without infarction.

Intracranial MRV:

1. Improved flow especially in the deep venous system. Improved flow
also seen in the proximal right transverse sinus.
2. Occlusive clot still seen in the superior sagittal, distal right
transverse, and right sigmoid dural sinuses. No recanalization of
the veins of Trolard.

## 2020-02-15 SURGERY — IR WITH ANESTHESIA
Anesthesia: General

## 2020-02-15 MED ORDER — IOHEXOL 300 MG/ML  SOLN
50.0000 mL | Freq: Once | INTRAMUSCULAR | Status: AC | PRN
  Administered 2020-02-15: 50 mL via INTRA_ARTERIAL

## 2020-02-15 MED ORDER — ROCURONIUM BROMIDE 10 MG/ML (PF) SYRINGE
PREFILLED_SYRINGE | INTRAVENOUS | Status: DC | PRN
  Administered 2020-02-15 (×4): 50 mg via INTRAVENOUS

## 2020-02-15 MED ORDER — HEPARIN SODIUM (PORCINE) 1000 UNIT/ML IJ SOLN
INTRAMUSCULAR | Status: DC | PRN
Start: 2020-02-15 — End: 2020-02-15
  Administered 2020-02-15: 3000 [IU] via INTRAVENOUS

## 2020-02-15 MED ORDER — SODIUM CHLORIDE 0.9 % IV SOLN: INTRAVENOUS | Status: DC

## 2020-02-15 MED ORDER — SODIUM CHLORIDE 0.9 % IV SOLN
2.0000 g | Freq: Three times a day (TID) | INTRAVENOUS | Status: DC
  Administered 2020-02-15 – 2020-02-17 (×8): 2 g via INTRAVENOUS
  Filled 2020-02-15 (×9): qty 2

## 2020-02-15 MED ORDER — CEFAZOLIN SODIUM-DEXTROSE 2-3 GM-%(50ML) IV SOLR
INTRAVENOUS | Status: DC | PRN
  Administered 2020-02-15: 2 g via INTRAVENOUS

## 2020-02-15 MED ORDER — VANCOMYCIN HCL 1250 MG/250ML IV SOLN
1250.0000 mg | Freq: Once | INTRAVENOUS | Status: AC
  Administered 2020-02-15: 1250 mg via INTRAVENOUS
  Filled 2020-02-15: qty 250

## 2020-02-15 MED ORDER — FENTANYL CITRATE (PF) 100 MCG/2ML IJ SOLN
INTRAMUSCULAR | Status: DC | PRN
  Administered 2020-02-15: 50 ug via INTRAVENOUS

## 2020-02-15 MED ORDER — HEPARIN (PORCINE) 25000 UT/250ML-% IV SOLN
1200.0000 [IU]/h | INTRAVENOUS | Status: DC
  Administered 2020-02-17: 03:00:00 600 [IU]/h via INTRAVENOUS
  Administered 2020-02-18: 900 [IU]/h via INTRAVENOUS
  Administered 2020-02-19 – 2020-02-22 (×5): 1200 [IU]/h via INTRAVENOUS
  Filled 2020-02-15 (×9): qty 250

## 2020-02-15 MED ORDER — CLEVIDIPINE BUTYRATE 0.5 MG/ML IV EMUL: 0.0000 mg/h | INTRAVENOUS | Status: AC

## 2020-02-15 MED ORDER — IOHEXOL 300 MG/ML  SOLN
150.0000 mL | Freq: Once | INTRAMUSCULAR | Status: AC | PRN
  Administered 2020-02-15: 60 mL via INTRA_ARTERIAL

## 2020-02-15 MED ORDER — ONDANSETRON HCL 4 MG/2ML IJ SOLN
INTRAMUSCULAR | Status: DC | PRN
  Administered 2020-02-15: 4 mg via INTRAVENOUS

## 2020-02-15 MED ORDER — ACETAMINOPHEN 325 MG PO TABS
650.0000 mg | ORAL_TABLET | ORAL | Status: DC | PRN
  Administered 2020-02-23 – 2020-02-27 (×2): 650 mg via ORAL
  Filled 2020-02-15 (×2): qty 2

## 2020-02-15 MED ORDER — GADOBUTROL 1 MMOL/ML IV SOLN
7.5000 mL | Freq: Once | INTRAVENOUS | Status: AC | PRN
  Administered 2020-02-15: 7.5 mL via INTRAVENOUS

## 2020-02-15 MED ORDER — SODIUM CHLORIDE 0.9 % IV SOLN: INTRAVENOUS | Status: DC | PRN

## 2020-02-15 MED ORDER — ACETAMINOPHEN 650 MG RE SUPP: 650.0000 mg | RECTAL | Status: DC | PRN

## 2020-02-15 MED ORDER — POTASSIUM CHLORIDE 10 MEQ/100ML IV SOLN
10.0000 meq | INTRAVENOUS | Status: AC
  Administered 2020-02-15 (×4): 10 meq via INTRAVENOUS
  Filled 2020-02-15 (×4): qty 100

## 2020-02-15 MED ORDER — ACETAMINOPHEN 160 MG/5ML PO SOLN: 650.0000 mg | ORAL | Status: DC | PRN

## 2020-02-15 MED ORDER — PHENYLEPHRINE CONCENTRATED 100MG/250ML (0.4 MG/ML) INFUSION SIMPLE
25.0000 ug/min | INTRAVENOUS | Status: DC
  Administered 2020-02-16: 27 ug/min via INTRAVENOUS
  Filled 2020-02-15 (×2): qty 250

## 2020-02-15 MED ORDER — CEFAZOLIN SODIUM-DEXTROSE 2-4 GM/100ML-% IV SOLN
INTRAVENOUS | Status: AC
  Filled 2020-02-15: qty 100

## 2020-02-15 MED ORDER — VANCOMYCIN HCL 750 MG/150ML IV SOLN
750.0000 mg | Freq: Three times a day (TID) | INTRAVENOUS | Status: DC
  Administered 2020-02-16 – 2020-02-17 (×6): 750 mg via INTRAVENOUS
  Filled 2020-02-15 (×6): qty 150

## 2020-02-15 MED ORDER — DEXAMETHASONE SODIUM PHOSPHATE 10 MG/ML IJ SOLN
INTRAMUSCULAR | Status: DC | PRN
  Administered 2020-02-15: 10 mg via INTRAVENOUS

## 2020-02-15 NOTE — Anesthesia Postprocedure Evaluation (Signed)
Anesthesia Post Note  Patient: Jill Clarke  Procedure(s) Performed: IR WITH ANESTHESIA (N/A )     Patient location during evaluation: SICU Anesthesia Type: General Level of consciousness: sedated Pain management: pain level controlled Vital Signs Assessment: post-procedure vital signs reviewed and stable Respiratory status: patient remains intubated per anesthesia plan Cardiovascular status: stable Postop Assessment: no apparent nausea or vomiting Anesthetic complications: no   No complications documented.  Last Vitals:  Vitals:   02/15/20 0715 02/15/20 0730  BP:  115/70  Pulse: 66 61  Resp: 18 18  Temp:    SpO2: 100% 100%    Last Pain:  Vitals:   02/15/20 0445  TempSrc: Oral                 [name]

## 2020-02-15 NOTE — Procedures (Signed)
S/P RT CCA and RT TS,SSS and RT SS venograms. RT CFV and LT CFA approach. S/P complete revascularization of SSS,Straight S,V of Galen and ICVs ,and RT TS with multiple passes with 27mmx 40 mm solitaire and 46mmx 43mm trevoprovue retrievers and zoom aspiration. Sig focal stenosis at the RT TS/SS junction angioplastied with an 8 mmx 40 mm balloon with  improved flow into RT SS.  Patent RT IJV. Post CT brain No ICH or mass effect. Both groin sheaths removed..Distal pulses palpable in both feet. Patient returned to the unit intubated. S.Elva Mauro MD.

## 2020-02-15 NOTE — Progress Notes (Addendum)
Spoke with MD [name] at bedside about possibility of pt going to IR. Tube feeds turned off. Pt's OG hooked up to low wall suction.   Spoke with mother over phone to update on pt's condition and complete witness signature phone consent with PA Jannifer Franklin for IR procedure. Physical consent was taken with PA Pam Turpin-not in chart at this time.   1045: Pt in IR preparing for procedure. Pulse and groin checks completed up to this RN's departure.

## 2020-02-15 NOTE — Progress Notes (Signed)
Pharmacy Antibiotic Note  Jill Clarke is a 35 y.o. female admitted on 02/12/2020 with dural venous thrombosis.  Pharmacy has been consulted for vancomycin and cefepime dosing for possible mastoiditis. She is afebrile, WBC are elevated at 14, and renal function is normal.  Plan: Vancomycin 1250 mg IV load then 750 mg IV q8h Trough goal 15-20 mcg/ml Cefepime 2 g IV q8h Monitor renal function, clinical progress, cultures/sensitivities F/U LOT and de-escalate as able Vancomycin trough at steady-state   Height: 5\' 5"  (165.1 cm) Weight: 68.4 kg (150 lb 12.7 oz) IBW/kg (Calculated) : 57  Temp (24hrs), Avg:98.2 F (36.8 C), Min:97.8 F (36.6 C), Max:98.9 F (37.2 C)  Recent Labs  Lab 02/10/20 2110 02/12/20 0127 02/13/20 0333 02/13/20 2058 02/14/20 0505 02/14/20 1111 02/15/20 1032 02/15/20 1240  WBC 11.7* 13.6* 13.3*  --   --  15.9* 14.0*  --   CREATININE 0.82 0.61  --  <0.20* 0.48 0.47 0.42* 0.20*    Estimated Creatinine Clearance: 95.4 mL/min (A) (by C-G formula based on SCr of 0.2 mg/dL (L)).    No Known Allergies  Antimicrobials this admission: vancomycin 1/23 >>  cefepime 1/23 >>   Dose adjustments this admission:   Microbiology results: COVID neg on 1/19, 1/20, 1/22  Thank you for involving pharmacy in this patient's care.  Renold Genta, PharmD, BCPS Clinical Pharmacist Clinical phone for 02/15/2020 until 3p is [phone] 02/15/2020 2:32 PM  **Pharmacist phone directory can be found on [email or web address] listed under [place]**

## 2020-02-15 NOTE — Progress Notes (Signed)
Paged MD Leonel Ramsay with neurology to inform of sodium level 149. Verbal order received from [facility] to increase hypertonic saline rate from 55mL/hr to 98mL/hr.

## 2020-02-15 NOTE — Progress Notes (Signed)
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 Progressive left perirolandic venous infarction and petechial hemorrhage. 2. New edema in the deep gray nuclei without infarction. Intracranial MRV: 1. Improved flow especially in the deep venous system. Improved flow also seen in the proximal right transverse sinus. 2. Occlusive clot still seen in the superior sagittal, distal right transverse, and right sigmoid dural sinuses. No recanalization of the veins of Trolard.  RN states pt does seem to move left arm purposefully Does not follow any commands No movement on Rt  Updated Dr Estanislado Pandy on status    Allergies: Patient has no known allergies.  Medications: Prior to Admission medications   Not on File     Vital Signs: BP 115/70   Pulse 61   Temp 98.1 F (36.7 C) (Oral)   Resp 18   Ht 5\' 5"  (1.651 m)   Wt 150 lb 12.7 oz (68.4 kg)   LMP 02/03/2020 (Approximate)   SpO2 100%   BMI 25.09 kg/m    Intubated; sedated No response No movement for me Bilat groin sheaths intact No bleeding or hematoma Good pulses at feet   Imaging: DG Abd 1 View  Result Date: 02/14/2020 CLINICAL DATA:  OG tube placement. EXAM: ABDOMEN - 1 VIEW COMPARISON:  None. FINDINGS: Enteric tube tip and side-port project over the stomach. Lung bases are clear. IMPRESSION: Enteric tube tip and side-port project over the stomach. Electronically Signed   By: Lovey Newcomer M.D.   On: 02/14/2020 13:07   CT HEAD WO CONTRAST  Result Date: 02/14/2020 CLINICAL DATA:  Stroke, follow up EXAM: CT HEAD WITHOUT CONTRAST TECHNIQUE: Contiguous axial images were obtained from the base of the skull through the vertex without intravenous contrast.  COMPARISON:  02/12/2020. FINDINGS: Brain: Decreased conspicuity of small volume subarachnoid hemorrhage along the bilateral frontoparietal regions within the basilar cistern. No new site of intracranial hemorrhage. Known small bilateral cerebral infarcts are not well demonstrated on this exam. Increased conspicuity of left cerebral edema. No midline shift or mass lesion.  No ventriculomegaly. Vascular: Hyperdense appearance of the cortical veins, posterosuperior sagittal sinus and right transverse sinus is less conspicuous than prior exam. No hyperdense arterial vessels. Skull: Negative for fracture or focal lesion. Sinuses/Orbits: No acute finding. Mild pansinus mucosal thickening with layering secretions. Other: None. IMPRESSION: Small volume bilateral frontoparietal and basilar cistern subarachnoid hemorrhage is unchanged. No new intracranial hemorrhage. Small bilateral cerebral infarcts are not well demonstrated on this exam. Increased conspicuity of left cerebral edema. Hyperdense appearance of the cortical veins, superior sagittal and right transverse sinus is less conspicuous than prior exam. Electronically Signed   By: Primitivo Gauze M.D.   On: 02/14/2020 09:58   CT Head Wo Contrast  Result Date: 02/12/2020 CLINICAL DATA:  Mental status change. EXAM: CT HEAD WITHOUT CONTRAST TECHNIQUE: Contiguous axial images were obtained from the base of the skull through the vertex without intravenous contrast. COMPARISON:  None. FINDINGS: Brain: There is a small volume of subarachnoid hemorrhage bilaterally, most evident in the right frontal parietal region (axial series 3, image 14). No midline shift. The ventricular system is unremarkable.There is a small volume of extra-axial hemorrhage in the basal cisterns. The gray-white differentiation is unremarkable. The cortical veins appear hyperdense and somewhat  enlarged. There is a questionable thrombus within the right transverse sinus. Vascular: There is no  hyperdense arterial structure. Skull: The calvarium is unremarkable. The skull base is unremarkable. The visualized upper cervical spine is unremarkable. Sinuses/Orbits: The visualized orbits are unremarkable. The paranasal sinuses are unremarkable. The mastoid air cells are clear. Other: The visualized parotid gland is unremarkable. There is no scalp soft tissue swelling. IMPRESSION: 1. The study is positive for a small volume of subarachnoid hemorrhage. There is no midline shift. 2. Hyperdense dural sinuses and cortical veins especially on the right in addition to a filling defect within the transverse sinus on the right is concerning for dural venous thrombosis and may be the source of the patient's subarachnoid hemorrhage. Emergent MRI/MRV is recommended for further evaluation. These results were called by telephone at the time of interpretation on 02/12/2020 at 4:10 am to provider Veryl Speak , who verbally acknowledged these results. Electronically Signed   By: Constance Holster M.D.   On: 02/12/2020 04:13   MR BRAIN WO CONTRAST  Result Date: 02/15/2020 CLINICAL DATA:  Follow-up dural sinus thrombosis. Interval heparin drip and mechanical thrombectomy. EXAM: MRI HEAD WITHOUT CONTRAST MRV HEAD WITHOUT CONTRAST TECHNIQUE: Multiplanar, multiecho pulse sequences of the brain and surrounding structures were obtained without intravenous contrast. Angiographic images of the intracranial venous structures were obtained using MRV technique without intravenous contrast. COMPARISON:  Brain MRI from 3 days ago FINDINGS: MRI HEAD WITHOUT CONTRAST Brain: Bilateral deep watershed white matter infarcts which appears similar in extent. The degree of restricted diffusion appears greater in these infarcts, compatible with interval evolution. Venous infarct along the high left central sulcus is progressed in extent. Small infarct in the right paramedian splenium of the corpus callosum with mild increase. T2 signal without  restricted diffusion in the right more than left basal ganglia and in the bilateral thalamus, progressed. There is increased FLAIR signal along the cerebral convexities which may be from engorged vessels or the subarachnoid blood seen on admission CT. On gradient imaging there is less vessel congestion suspected. Increased petechial hemorrhage along the left-sided venous infarct. Vascular: T1 hyperintense evolution of the dural venous sinus thrombosis which is most confluent in the superior sagittal sinus, right transverse sinus, and right sigmoid sinus. Clot is also seen in the bilateral vein of Trolard. Skull and upper cervical spine: Normal marrow signal Sinuses/Orbits: Negative MR VENOGRAM WITHOUT CONTRAST There has been improvement of venous flow after thrombectomy. Improvements are primarily seen at the level of the torcula and proximal right transverse sinus. There is also now straight sinus and internal cerebral vein flow. Occlusive thrombus is still seen at the level of the superior sagittal sinus, distal right transverse, and right sigmoid sinuses. Veins of Trolard remain thrombosed, best seen by noncontrast T1 weighted imaging. IMPRESSION: Brain MRI: 1. Progressive left perirolandic venous infarction and petechial hemorrhage. 2. New edema in the deep gray nuclei without infarction. Intracranial MRV: 1. Improved flow especially in the deep venous system. Improved flow also seen in the proximal right transverse sinus. 2. Occlusive clot still seen in the superior sagittal, distal right transverse, and right sigmoid dural sinuses. No recanalization of the veins of Trolard. Electronically Signed   By: Monte Fantasia M.D.   On: 02/15/2020 05:10   MR BRAIN WO CONTRAST  Result Date: 02/12/2020 CLINICAL DATA:  Mental status change with unknown cause. EXAM: MRI HEAD WITHOUT CONTRAST MRV HEAD WITHOUT CONTRAST TECHNIQUE: Multiplanar, multiecho pulse sequences of the brain and surrounding structures were obtained  without intravenous contrast. Angiographic images of the intracranial venous structures were obtained using MRV technique without intravenous contrast. COMPARISON:  Head CT from earlier today FINDINGS: MRI HEAD WITHOUT CONTRAST Brain: Small patchy acute infarcts in the bilateral cerebral white matter and along the high left sylvian fissure where there is likely accentuation by susceptibility artifact. These are in a deep watershed pattern. Limited sulci, even for age which likely reflects diffuse swelling. Focal swelling is seen to a greater extent than the infarction along the high perirolandic cortex, left more than right. Small volume subarachnoid hemorrhage as seen on head CT, mainly at the areas of greatest swelling. There is marked intravascular susceptibility signal involving the right more than left cortical, dural sinus, and deep veins due to intravascular deoxygenation/slow flow. No hydrocephalus. Vascular: Dural venous sinus thrombosis with further description below. Skull and upper cervical spine: No focal marrow signal abnormality Sinuses/Orbits: Negative MR VENOGRAM WITHOUT CONTRAST Abrupt occlusion of the mid superior sagittal sinus with confluent thrombus extending to the torcula, right transverse, right sigmoid, and right upper internal jugular vein. Bilateral vein of Trolard thrombosis. The straight sinus and vein of Galen are also non flowing. No internal cerebral flow on either side. Critical Value/emergent results were called by telephone at the time of interpretation on 02/12/2020 at 6:49 am to provider Veryl Speak , who verbally acknowledged these results. IMPRESSION: 1. Extensive, acute dural venous sinus thrombosis involving the superior sagittal sinus into the right internal jugular vein via the dominant right transverse and sigmoid system. Both veins of Trolard are thrombosed and there is straight sinus/deep cerebral thrombosis as well. 2. Deep watershed white matter infarcts, small volume  subarachnoid hemorrhage, and left more than right frontoparietal cortical swelling due to #1. Electronically Signed   By: Monte Fantasia M.D.   On: 02/12/2020 06:50   MR Venogram Head  Result Date: 02/15/2020 CLINICAL DATA:  Follow-up dural sinus thrombosis. Interval heparin drip and mechanical thrombectomy. EXAM: MRI HEAD WITHOUT CONTRAST MRV HEAD WITHOUT CONTRAST TECHNIQUE: Multiplanar, multiecho pulse sequences of the brain and surrounding structures were obtained without intravenous contrast. Angiographic images of the intracranial venous structures were obtained using MRV technique without intravenous contrast. COMPARISON:  Brain MRI from 3 days ago FINDINGS: MRI HEAD WITHOUT CONTRAST Brain: Bilateral deep watershed white matter infarcts which appears similar in extent. The degree of restricted diffusion appears greater in these infarcts, compatible with interval evolution. Venous infarct along the high left central sulcus is progressed in extent. Small infarct in the right paramedian splenium of the corpus callosum with mild increase. T2 signal without restricted diffusion in the right more than left basal ganglia and in the bilateral thalamus, progressed. There is increased FLAIR signal along the cerebral convexities which may be from engorged vessels or the subarachnoid blood seen on admission CT. On gradient imaging there is less vessel congestion suspected. Increased petechial hemorrhage along the left-sided venous infarct. Vascular: T1 hyperintense evolution of the dural venous sinus thrombosis which is most confluent in the superior sagittal sinus, right transverse sinus, and right sigmoid sinus. Clot is also seen in the bilateral vein of Trolard. Skull and upper cervical spine: Normal marrow signal Sinuses/Orbits: Negative MR VENOGRAM WITHOUT CONTRAST There has been improvement of venous flow after thrombectomy. Improvements are primarily seen at the level of the torcula and proximal right  transverse sinus. There is also now straight sinus and internal cerebral vein flow. Occlusive thrombus is still seen at the level of the superior sagittal sinus, distal right transverse,  and right sigmoid sinuses. Veins of Trolard remain thrombosed, best seen by noncontrast T1 weighted imaging. IMPRESSION: Brain MRI: 1. Progressive left perirolandic venous infarction and petechial hemorrhage. 2. New edema in the deep gray nuclei without infarction. Intracranial MRV: 1. Improved flow especially in the deep venous system. Improved flow also seen in the proximal right transverse sinus. 2. Occlusive clot still seen in the superior sagittal, distal right transverse, and right sigmoid dural sinuses. No recanalization of the veins of Trolard. Electronically Signed   By: Monte Fantasia M.D.   On: 02/15/2020 05:10   MR Venogram Head  Result Date: 02/12/2020 CLINICAL DATA:  Mental status change with unknown cause. EXAM: MRI HEAD WITHOUT CONTRAST MRV HEAD WITHOUT CONTRAST TECHNIQUE: Multiplanar, multiecho pulse sequences of the brain and surrounding structures were obtained without intravenous contrast. Angiographic images of the intracranial venous structures were obtained using MRV technique without intravenous contrast. COMPARISON:  Head CT from earlier today FINDINGS: MRI HEAD WITHOUT CONTRAST Brain: Small patchy acute infarcts in the bilateral cerebral white matter and along the high left sylvian fissure where there is likely accentuation by susceptibility artifact. These are in a deep watershed pattern. Limited sulci, even for age which likely reflects diffuse swelling. Focal swelling is seen to a greater extent than the infarction along the high perirolandic cortex, left more than right. Small volume subarachnoid hemorrhage as seen on head CT, mainly at the areas of greatest swelling. There is marked intravascular susceptibility signal involving the right more than left cortical, dural sinus, and deep veins due to  intravascular deoxygenation/slow flow. No hydrocephalus. Vascular: Dural venous sinus thrombosis with further description below. Skull and upper cervical spine: No focal marrow signal abnormality Sinuses/Orbits: Negative MR VENOGRAM WITHOUT CONTRAST Abrupt occlusion of the mid superior sagittal sinus with confluent thrombus extending to the torcula, right transverse, right sigmoid, and right upper internal jugular vein. Bilateral vein of Trolard thrombosis. The straight sinus and vein of Galen are also non flowing. No internal cerebral flow on either side. Critical Value/emergent results were called by telephone at the time of interpretation on 02/12/2020 at 6:49 am to provider Veryl Speak , who verbally acknowledged these results. IMPRESSION: 1. Extensive, acute dural venous sinus thrombosis involving the superior sagittal sinus into the right internal jugular vein via the dominant right transverse and sigmoid system. Both veins of Trolard are thrombosed and there is straight sinus/deep cerebral thrombosis as well. 2. Deep watershed white matter infarcts, small volume subarachnoid hemorrhage, and left more than right frontoparietal cortical swelling due to #1. Electronically Signed   By: Monte Fantasia M.D.   On: 02/12/2020 06:50   DG CHEST PORT 1 VIEW  Result Date: 02/14/2020 CLINICAL DATA:  Endotracheal tube placement. EXAM: PORTABLE CHEST 1 VIEW COMPARISON:  One day prior FINDINGS: Endotracheal tube terminates 3.9 cm above carina. Nasogastric tube extends beyond the inferior aspect of the film. Right-sided PICC line is difficult to follow centrally. Most likely with tip at cavoatrial junction. Midline trachea. Normal heart size. No pleural effusion or pneumothorax. Improved left lower lobe aeration with mild bibasilar atelectasis remaining. IMPRESSION: Appropriate position of endotracheal tube. Bibasilar atelectasis with significantly improved left lower lobe aeration. Electronically Signed   By: Abigail Miyamoto M.D.   On: 02/14/2020 13:08   DG Chest Port 1 View  Result Date: 02/13/2020 CLINICAL DATA:  01/19/2014 EXAM: PORTABLE CHEST 1 VIEW COMPARISON:  Respiratory FINDINGS: Later endotracheal tube is seen with its tip just within the right mainstem bronchus. Nasogastric tube tip  is seen within the proximal body of the stomach with its proximal side hole at the expected gastroesophageal junction. Retrocardiac opacity likely relates to partial left lower lobe collapse. Right lung is clear. No pneumothorax or pleural effusion. Cardiac size within normal limits. Pulmonary vascularity is normal. IMPRESSION: Right mainstem intubation. Withdrawal of the endotracheal tube by approximately 2.5 cm may better position the catheter. Partial left lower lobe collapse. Nasogastric tube proximal side hole at the gastroesophageal junction. Advancement by roughly 5 cm may better position the catheter. These results will be called to the ordering clinician or representative by the Radiologist Assistant, and communication documented in the PACS or [employer]. Electronically Signed   By: Fidela Salisbury MD   On: 02/13/2020 23:35   EEG adult  Result Date: 02/12/2020 Lora Havens, MD     02/12/2020  4:06 PM Patient Name: ISHIA HANANIA MRN: [id number] Epilepsy Attending: Lora Havens Referring Physician/Provider: Dr. Kathrynn Speed Date: 02/12/2020 Duration: 25.25 minutes Patient history: 35 year old female with altered mental status.  EEG to evaluate for seizures. Level of alertness: Awake, asleep AEDs during EEG study: None Technical aspects: This EEG study was done with scalp electrodes positioned according to the 10-20 International system of electrode placement. Electrical activity was acquired at a sampling rate of 500Hz  and reviewed with a high frequency filter of 70Hz  and a low frequency filter of 1Hz . EEG data were recorded continuously and digitally stored. Description: Np posterior dominant rhythm was  seen. Sleep was characterized by sleep spindles (12 to 14 Hz), maximal frontocentral region.  EEG showed continuous generalized 3 to 5 Hz theta and delta slowing.  Hyperventilation and photic stimulation were not performed. At 1531, patient was noted to move her right arm towards her chest followed by slow but rhythmic jerking of right upper extremity.  Concomitant EEG showed high amplitude 2 to 3 Hz rhythmic delta slowing in bitemporal region which gradually increases in amplitude and appears more sharply contoured without definite evolution in frequency. ABNORMALITY -Continue slow, generalized IMPRESSION: This study is suggestive of moderate diffuse encephalopathy, nonspecific etiology.  At 1531, patient was noted to have right upper extremity jerking.  Concomitant EEG showed high amplitude 2 to 3 Hz rhythmic delta slowing in bitemporal region with evolution in morphology and amplitude but without definite evolution in frequency concerning for focal motor seizure. Dr. Leonel Ramsay was notified. Priyanka O Yadav   [place] EKG SITE RITE  Result Date: 02/13/2020 If [facility] image not attached, placement could not be confirmed due to current cardiac rhythm.   Labs:  CBC: Recent Labs    02/10/20 2110 02/12/20 0127 02/13/20 0333 02/13/20 2058 02/14/20 0306 02/14/20 1111  WBC 11.7* 13.6* 13.3*  --   --  15.9*  HGB 13.6 12.6 11.0* 7.5* 8.2* 8.6*  HCT 41.2 37.0 34.1* 22.0* 24.0* 27.7*  PLT 634* 578* 564*  --   --  575*    COAGS: Recent Labs    02/12/20 0417  INR 1.2    BMP: Recent Labs    02/10/20 2110 02/12/20 0127 02/13/20 1521 02/13/20 2058 02/14/20 0004 02/14/20 0306 02/14/20 0505 02/14/20 1111 02/14/20 1821 02/14/20 2157 02/15/20 0513  NA 135 137   < > 154*   < > 156* 153* 156* 161* 159* 156*  K 2.9* 3.5  --  2.8*  --  2.9* 2.9* 3.0*  --   --   --   CL 95* 103  --  118*  --   --  123*  124*  --   --   --   CO2 18* 20*  --   --   --   --  21* 24  --   --   --   GLUCOSE 150*  168*  --  94  --   --  120* 97  --   --   --   BUN 12 9  --  3*  --   --  <5* <5*  --   --   --   CALCIUM 9.4 9.3  --   --   --   --  7.8* 8.1*  --   --   --   CREATININE 0.82 0.61  --  <0.20*  --   --  0.48 0.47  --   --   --   GFRNONAA >60 >60  --   --   --   --  >60 >60  --   --   --    < > = values in this interval not displayed.    LIVER FUNCTION TESTS: Recent Labs    02/10/20 2110 02/12/20 0127  BILITOT 1.6* 1.0  AST 32 16  ALT 42 30  ALKPHOS 58 52  PROT 8.5* 8.3*  ALBUMIN 3.9 3.6    Assessment and Plan:  Stable MRI/MRV performed this am Updated Dr Estanislado Pandy He will discuss with Dr [name] for plan   Electronically Signed: Lavonia Drafts, PA-C 02/15/2020, 7:59 AM   I spent a total of 15 Minutes at the the patient's bedside AND on the patient's hospital floor or unit, greater than 50% of which was counseling/coordinating care for Intracranial venous thrombosis

## 2020-02-15 NOTE — Progress Notes (Signed)
Called MD Leonel Ramsay to inform of urine output 450 ml for 1700 and 65mL out at 1800.  Order to received to obtain specific gravity. MD Agarwala informed at the bedside.

## 2020-02-15 NOTE — Progress Notes (Addendum)
ANTICOAGULATION CONSULT NOTE - Follow Up Consult  Pharmacy Consult for heparin Indication: dural venous sinus thrombosis now s/p thrombectomy  Labs: Recent Labs    02/13/20 0333 02/13/20 1008 02/14/20 1111 02/14/20 1549 02/14/20 2326 02/15/20 0149 02/15/20 0513 02/15/20 1032 02/15/20 1240 02/15/20 1434 02/15/20 2300  HGB 11.0*   < > 8.6*  --   --   --   --  8.2* 7.1*  --   --   HCT 34.1*   < > 27.7*  --   --   --   --  26.6* 21.0*  --   --   PLT 564*  --  575*  --   --   --   --  536*  --   --   --   HEPARINUNFRC 0.48   < > 0.31   < >  --   --    < > 0.15*  --  2.14* 0.83*  CREATININE  --    < > 0.47  --   --   --   --  0.42* 0.20*  --  0.41*  TROPONINIHS  --   --   --   --  4 5  --   --   --   --   --    < > = values in this interval not displayed.    Assessment: 35yo female now supratherapeutic on heparin after resumed post-IR; no gtt issues or signs of bleeding per RN.  Goal of Therapy:  Heparin level 0.3-0.5 units/ml   Plan:  Will decrease heparin gtt by 2 units/kg/hr to 600 units/hr and check level with next scheduled lab draw.    Wynona Neat, PharmD, BCPS  02/15/2020,11:55 PM   Addendum: Heparin level now 0.45, at goal.  Will continue gtt at current rate and confirm stable with additional level.  VB 02/16/2020 5:03 AM

## 2020-02-15 NOTE — Progress Notes (Signed)
Called and spoke with MD Deveshwar to confirm restart time of heparin. Per MD Deveshwar the restart time for heparin is one hour after procedure end time (1530) at previous rate. No bolus. Pharmacist [facility] updated.

## 2020-02-15 NOTE — Progress Notes (Signed)
Pt placed back on full support on ventilator due to low respiratory rate and planned trip to IR within the hour.

## 2020-02-15 NOTE — Addendum Note (Signed)
Addendum  created 02/15/20 1643 by Candis Shine, CRNA   Charge Capture section accepted

## 2020-02-15 NOTE — Progress Notes (Signed)
PT Cancellation Note  Patient Details Name: Jill Clarke MRN: [id number] DOB: [birth year]   Cancelled Treatment:    Reason Eval/Treat Not Completed: Medical issues which prohibited therapy  Spoke with Claiborne Billings, RN earlier today and pt still with bil femoral sheaths at that time. Note that she has now been to IR and they have been removed. PT will reattempt evaluation 1/24.    Arby Barrette, PT Pager [phone]   Rexanne Mano 02/15/2020, 3:38 PM

## 2020-02-15 NOTE — Transfer of Care (Signed)
Immediate Anesthesia Transfer of Care Note  Patient: Jill Clarke  Procedure(s) Performed: IR WITH ANESTHESIA (N/A )  Patient Location: ICU  Anesthesia Type:General  Level of Consciousness: sedated and Patient remains intubated per anesthesia plan  Airway & Oxygen Therapy: Patient remains intubated per anesthesia plan and Patient placed on Ventilator (see vital sign flow sheet for setting)  Post-op Assessment: Report given to RN and Post -op Vital signs reviewed and stable  Post vital signs: Reviewed and stable  Last Vitals:  Vitals Value Taken Time  BP 140/101 02/15/20 1410  Temp    Pulse 81 02/15/20 1418  Resp 18 02/15/20 1418  SpO2 100 % 02/15/20 1418  Vitals shown include unvalidated device data.  Last Pain:  Vitals:   02/15/20 0800  TempSrc: Axillary         Complications: No complications documented.

## 2020-02-15 NOTE — Consult Note (Signed)
NAME:  Jill Clarke, MRN:  [id number], DOB:  1985/05/27, LOS: 3 ADMISSION DATE:  02/12/2020, CONSULTATION DATE:  02/13/2020 REFERRING MD:  Leonel Ramsay CHIEF COMPLAINT:  AMS   Brief History:  38 yoF presented with AMS found to have a small volume SAH and dural venous thrombosis with deep watershed white matter infarcts. PCCM consulted for possible post-op intubation/ventilation management.  History of Present Illness:  79 yoF initially presented to the ED 02/10/20 due to one week of headache, N/V and diarrhea. She was discharged after successful PO challenge. She returned to her mother's house when her mother states she was not behaving normally, rubbing the comforter excessively, turing the light switch on and off repetitively and not verbally response so she returned to the ED 1/19. At that time a CT head revealed small volume subarachnoid hemorrhage and hyperdense dural sinuses with a filling defect concerning for dural venous thrombosis. 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 Neurology was consulted for further management.  Past Medical History:  Asthma Sickle cell trait DM  Significant Hospital Events:  Admitted 1/20  Consults:  PCCM IR  Procedures:  EEG 1/20- This study is suggestive of moderate diffuse encephalopathy, nonspecific etiology.  At 1531, patient was noted to have right upper extremity jerking.  Concomitant EEG showed high amplitude 2 to 3 Hz rhythmic delta slowing in bitemporal region with evolution in morphology and amplitude but without definite evolution in frequency concerning for focal motor seizure.  Significant Diagnostic Tests:   CT-scan of the brain IMPRESSION: 1. The study is positive for a small volume of subarachnoid hemorrhage. There is no midline shift. 2. Hyperdense dural sinuses and cortical veins especially on the right in addition to a filling defect within the transverse sinus  on the right is concerning for dural venous thrombosis and may be the source of the patient's subarachnoid hemorrhage. Emergent MRI/MRV is recommended for further evaluation.  MRI/MR venogram examination of the brain IMPRESSION: 1. Extensive, acute dural venous sinus thrombosis involving the superior sagittal sinus into the right internal jugular vein via the dominant right transverse and sigmoid system. Both veins of Trolard are thrombosed and there is straight sinus/deep cerebral thrombosis as well. 2. Deep watershed white matter infarcts, small volume subarachnoid hemorrhage, and left more than right frontoparietal cortical swelling due to #1.  Follow-up CT 1/22: cereberal edema same or slightly worse, clot burden has improved (personal review)   Interim History / Subjective:   Further successful clot debulking. For re-imaging and possible further intervention tomorrow.   Objective   Blood pressure 114/72, pulse 91, temperature 98.2 F (36.8 C), temperature source Axillary, resp. rate 18, height 5\' 5"  (1.651 m), weight 68.4 kg, last menstrual period 02/03/2020, SpO2 100 %.    Vent Mode: CPAP;PSV FiO2 (%):  [30 %] 30 % Set Rate:  [16 bmp] 16 bmp Vt Set:  [450 mL] 450 mL PEEP:  [5 cmH20] 5 cmH20 Pressure Support:  [12 cmH20] 12 cmH20 Plateau Pressure:  [15 cmH20-18 cmH20] 15 cmH20   Intake/Output Summary (Last 24 hours) at 02/15/2020 1628 Last data filed at 02/15/2020 1450 Gross per 24 hour  Intake 3081.21 ml  Output 6025 ml  Net -2943.79 ml   Filed Weights   02/13/20 1101  Weight: 68.4 kg    Examination: General: Appears stated age comfortably lying in bed. Intubated and sedated. Lungs: Mechanically ventilated, no dyssynchrony, on minimal settings. Chest clear to auscultation. Cardiovascular: RRR, no murmurs Abdomen:  BS+, soft, nondistended, nontender Extremities: warm and dry, no LE edema Neuro: unable to follow commands, localizes to pain. Intact cranial nerves. More  sedate than yesterday.    Resolved Hospital Problem list     Assessment & Plan:   34yoF presented with AMS after one week of headache, n/v and diarrhea. CT head and MRI brain demonstrated small volume SAH and extensive dural venous sinus thrombosis.   Extensive dural venous thrombosis  Cerebral edema requiring titration of hypertonic saline Thought to be due to dehydration from GI illness and subsequently leading to congestion and causing SAH. Patient does have a history of sickle cell trait; hypercoagulable panel pending. On heparin gtt. IR to attempt aspiration/thrombectomy procedure today.  Expected postprocedural respiratory failure require mechanical ventilation and sedation.  Plan:  -Continue full ventilatory support in anticipation of further clot debulking and potential for worsening cerebral edema. -Continue IV heparin -Continue sedation at current level. -Target sodium 150-155   Best practice (evaluated daily)  Diet: npo Pain/Anxiety/Delirium protocol (if indicated): n/a VAP protocol (if indicated): n/a DVT prophylaxis: heparin GI prophylaxis: protonix Glucose control: SSI Mobility: bedrest Disposition:pending clinical improvement  Goals of Care:  Last date of multidisciplinary goals of care discussion: on admission Family and staff present: Brother at bedside Summary of discussion: full scope of care Follow up goals of care discussion due: n/a Code Status: Full code  Family member was updated at bedside today.  Labs   CBC: Recent Labs  Lab 02/10/20 2110 02/12/20 0127 02/13/20 0333 02/13/20 2058 02/14/20 0306 02/14/20 1111 02/15/20 1032 02/15/20 1240  WBC 11.7* 13.6* 13.3*  --   --  15.9* 14.0*  --   NEUTROABS  --   --   --   --   --  9.8*  --   --   HGB 13.6 12.6 11.0* 7.5* 8.2* 8.6* 8.2* 7.1*  HCT 41.2 37.0 34.1* 22.0* 24.0* 27.7* 26.6* 21.0*  MCV 88.2 88.1 90.5  --   --  93.9 94.7  --   PLT 634* 578* 564*  --   --  575* 536*  --     Basic  Metabolic Panel: Recent Labs  Lab 02/10/20 2110 02/12/20 0127 02/13/20 1521 02/13/20 2058 02/14/20 0004 02/14/20 0306 02/14/20 0505 02/14/20 1111 02/14/20 1821 02/14/20 2157 02/15/20 0513 02/15/20 1032 02/15/20 1240  NA 135 137   < > 154*   < > 156* 153* 156* 161* 159* 156* 153* 154*  K 2.9* 3.5  --  2.8*  --  2.9* 2.9* 3.0*  --   --   --  3.2* 2.6*  CL 95* 103  --  118*  --   --  123* 124*  --   --   --  116* 114*  CO2 18* 20*  --   --   --   --  21* 24  --   --   --  27  --   GLUCOSE 150* 168*  --  94  --   --  120* 97  --   --   --  103* 96  BUN 12 9  --  3*  --   --  <5* <5*  --   --   --  <5* <3*  CREATININE 0.82 0.61  --  <0.20*  --   --  0.48 0.47  --   --   --  0.42* 0.20*  CALCIUM 9.4 9.3  --   --   --   --  7.8*  8.1*  --   --   --  8.6*  --   MG  --   --   --   --   --   --   --  1.7  --  1.8 1.7  --   --   PHOS  --   --   --   --   --   --   --  2.0*  --  1.3* 1.9*  --   --    < > = values in this interval not displayed.   GFR: Estimated Creatinine Clearance: 95.4 mL/min (A) (by C-G formula based on SCr of 0.2 mg/dL (L)). Recent Labs  Lab 02/12/20 0127 02/13/20 0333 02/14/20 1111 02/15/20 1032  WBC 13.6* 13.3* 15.9* 14.0*    Liver Function Tests: Recent Labs  Lab 02/10/20 2110 02/12/20 0127  AST 32 16  ALT 42 30  ALKPHOS 58 52  BILITOT 1.6* 1.0  PROT 8.5* 8.3*  ALBUMIN 3.9 3.6   Recent Labs  Lab 02/10/20 2110  LIPASE 18   No results for input(s): AMMONIA in the last 168 hours.  ABG    Component Value Date/Time   PHART 7.485 (H) 02/14/2020 0306   PCO2ART 32.4 02/14/2020 0306   PO2ART 431 (H) 02/14/2020 0306   HCO3 24.6 02/14/2020 0306   TCO2 26 02/15/2020 1240   O2SAT 100.0 02/14/2020 0306     Coagulation Profile: Recent Labs  Lab 02/12/20 0417  INR 1.2    Cardiac Enzymes: No results for input(s): CKTOTAL, CKMB, CKMBINDEX, TROPONINI in the last 168 hours.  HbA1C: Hgb A1c MFr Bld  Date/Time Value Ref Range Status  02/13/2020  03:33 AM 4.9 4.8 - 5.6 % Final    Comment:    (NOTE) Pre diabetes:          5.7%-6.4%  Diabetes:              >6.4%  Glycemic control for   <7.0% adults with diabetes   02/12/2020 01:27 AM 4.9 4.8 - 5.6 % Final    Comment:    (NOTE) Pre diabetes:          5.7%-6.4%  Diabetes:              >6.4%  Glycemic control for   <7.0% adults with diabetes     CBG: Recent Labs  Lab 02/14/20 2002 02/14/20 2338 02/15/20 0510 02/15/20 0906 02/15/20 1559  GLUCAP 124* 96 93 84 101*   CRITICAL CARE Performed by: Kipp Brood   Total critical care time: 35 minutes  Critical care time was exclusive of separately billable procedures and treating other patients.  Critical care was necessary to treat or prevent imminent or life-threatening deterioration.  Critical care was time spent personally by me on the following activities: development of treatment plan with patient and/or surrogate as well as nursing, discussions with consultants, evaluation of patient's response to treatment, examination of patient, obtaining history from patient or surrogate, ordering and performing treatments and interventions, ordering and review of laboratory studies, ordering and review of radiographic studies, pulse oximetry, re-evaluation of patient's condition and participation in multidisciplinary rounds.  Kipp Brood, MD [facility] ICU Physician [place]  Pager: [phone] Mobile: [phone] After hours: [phone].

## 2020-02-15 NOTE — Progress Notes (Signed)
STROKE TEAM PROGRESS NOTE  INTERVAL HISTORY Patient is neurologically slightly more arousable but remains obtunded and not speaking or following commands.  She remains quite purposeful with movements in the left arm and leg but has right hemiparesis.  Serum sodium remains at goal with last serum sodium being 156 and hypertonic saline drip been reduced now to 55 cc an hour.  MRI scan of the brain this morning shows progressive left perirolandic venous infarction with petechial hemorrhage and new edema developing now in the deep gray nuclei but without infarction.  MRV from today showed improved flow in the deep venous system and proximal right transverse sinus.  Occlusive clot is still seen in the superior slightly, distal right transverse and right sigmoid dural sinuses.  There is no recanalization of the vein of Trolard.  Protein S activity was low at 68 but total protein S was normal due to the finding of unclear significance and may need to be repeated in 6 to 8 weeks as it may be acute phase reactant.  Protein C total and activity as well as Antithrombin III levels and lupus anticoagulants were all normal.  Beta-2 glycoprotein and homocystine were also normal.  Factor V Leiden and prothrombin gene mutation results are pending  Vitals:   02/15/20 0615 02/15/20 0630 02/15/20 0645 02/15/20 0700  BP:  117/73  116/74  Pulse: (!) 54 64 63 (!) 59  Resp: 17 19 19 17   Temp:      TempSrc:      SpO2: 100% 100% 100% 100%  Weight:      Height:       CBC:  Recent Labs  Lab 02/13/20 0333 02/13/20 2058 02/14/20 0306 02/14/20 1111  WBC 13.3*  --   --  15.9*  NEUTROABS  --   --   --  9.8*  HGB 11.0*   < > 8.2* 8.6*  HCT 34.1*   < > 24.0* 27.7*  MCV 90.5  --   --  93.9  PLT 564*  --   --  575*   < > = values in this interval not displayed.   Basic Metabolic Panel:  Recent Labs  Lab 02/14/20 0505 02/14/20 0505 02/14/20 1111 02/14/20 1821 02/14/20 2157 02/15/20 0513  NA 153*  --  156*   < >  159* 156*  K 2.9*  --  3.0*  --   --   --   CL 123*  --  124*  --   --   --   CO2 21*  --  24  --   --   --   GLUCOSE 120*  --  97  --   --   --   BUN <5*  --  <5*  --   --   --   CREATININE 0.48  --  0.47  --   --   --   CALCIUM 7.8*  --  8.1*  --   --   --   MG  --    < > 1.7  --  1.8 1.7  PHOS  --    < > 2.0*  --  1.3* 1.9*   < > = values in this interval not displayed.   Lipid Panel:  Recent Labs  Lab 02/13/20 0333 02/14/20 0505  CHOL 148  --   TRIG 60 72  HDL 28*  --   CHOLHDL 5.3  --   VLDL 12  --   LDLCALC 108*  --  HgbA1c:  Recent Labs  Lab 02/13/20 0333  HGBA1C 4.9   Urine Drug Screen:  Recent Labs  Lab 02/12/20 0701  LABOPIA NONE DETECTED  COCAINSCRNUR NONE DETECTED  LABBENZ NONE DETECTED  AMPHETMU NONE DETECTED  THCU NONE DETECTED  LABBARB NONE DETECTED    Alcohol Level  Recent Labs  Lab 02/12/20 0417  ETH <10    IMAGING past 24 hours  CT Head wo contrast 02/14/2020  IMPRESSION: Small volume bilateral frontoparietal and basilar cistern subarachnoid hemorrhage is unchanged. No new intracranial hemorrhage.  Small bilateral cerebral infarcts are not well demonstrated on this exam.  Increased conspicuity of left cerebral edema.  Hyperdense appearance of the cortical veins, superior sagittal and right transverse sinus is less conspicuous than prior exam.  IR Thrombectomy 02/13/2020  Findings. 1.Extensive thrombus load in the TS bilaterally,the Rt SS ,RT IJV and post one third of the SSS. Large chunks of clot removed with revascularization of the TS bilaterally and partially the RT SS and the torcula. Sig decreased venous congestion of the cortical veins noted post procedure. Post CT brain No ICH or mass effect or hydrocephalus. Clinically stable with pupils 80mm sluggish.  CT Head wo contrast 02/12/2020  IMPRESSION: 1. The study is positive for a small volume of subarachnoid hemorrhage. There is no midline shift. 2. Hyperdense  dural sinuses and cortical veins especially on the right in addition to a filling defect within the transverse sinus on the right is concerning for dural venous thrombosis and may be the source of the patient's subarachnoid hemorrhage. Emergent MRI/MRV is recommended for further evaluation.  MR Brain wo contrast MR Venogram Head 02/12/2020 IMPRESSION: 1. Extensive, acute dural venous sinus thrombosis involving the superior sagittal sinus into the right internal jugular vein via the dominant right transverse and sigmoid system. Both veins of Trolard are thrombosed and there is straight sinus/deep cerebral thrombosis as well. 2. Deep watershed white matter infarcts, small volume subarachnoid hemorrhage, and left more than right frontoparietal cortical swelling due to #1.  MR Brain wo contrast MR Venogram Head 02/15/2020  Brain MRI IMPRESSION: 1. Progressive left perirolandic venous infarction and petechial hemorrhage. 2. New edema in the deep gray nuclei without infarction.  Intracranial MRV: IMPRESSION: 1. Improved flow especially in the deep venous system. Improved flow also seen in the proximal right transverse sinus. 2. Occlusive clot still seen in the superior sagittal, distal right transverse, and right sigmoid dural sinuses. No recanalization of the veins of Trolard.  EEG adult 02/12/2020  ABNORMALITY -Continue slow, generalized IMPRESSION: This study is suggestive of moderate diffuse encephalopathy, nonspecific etiology.  At 1531, patient was noted to have right upper extremity jerking.  Concomitant EEG showed high amplitude 2 to 3 Hz rhythmic delta slowing in bitemporal region with evolution in morphology and amplitude but without definite evolution in frequency concerning for focal motor seizure.   IMPRESSION: This study is suggestive of moderate diffuse encephalopathy, nonspecific etiology.  At 1531, patient was noted to have right upper extremity jerking.   Concomitant EEG showed high amplitude 2 to 3 Hz rhythmic delta slowing in bitemporal region with evolution in morphology and amplitude but without definite evolution in frequency concerning for focal motor seizure.   PHYSICAL EXAM GENERAL: Stuporous.  Intubated, laying in bed, in no acute distress Head: Normocephalic and atraumatic. EENT: No OP obstruction, normal conjunctiva.  LUNGS - Normal respiratory effort on room air CV - Tachycardic to the 130's on cardiac monitor, 2+ pedal pulses ABDOMEN - Soft, non-distended Ext: warm,  well perfused Neurological:  Mental Status: Patient is intubated and sedated Stuporous opens eyes partially temporarily only to sternal rub, does not respond to verbal stimuli, does not follow commands.  Opens eyes partially but cannot sustain attention Cranial Nerves: Right eye slightly hypertrophic left eye position normal, doll's eye reflex +.  Both pupils small sluggishly reactive.  Corneal reflexes are present bilaterally face is symmetric.  Tongue midline.  Fundi not visualized Motor: Brisk semipurposeful movements in the left upper and lower extremity to sternal rub and trace movements on the right. Sensory: Unable to access. Deep Tendon Reflexes:  Right: Upper Extremity   Left: Upper extremity   biceps (C-5 to C-6) 2/4   biceps (C-5 to C-6) 2/4 tricep (C7) 2/4    triceps (C7) 2/4 Brachioradialis (C6) 2/4  Brachioradialis (C6) 2/4  Lower Extremity Lower Extremity  quadriceps (L-2 to L-4) 2/4   quadriceps (L-2 to L-4) 2/4 Achilles (S1) 2/4   Achilles (S1) 2/4 Gait: Deferred   ASSESSMENT/PLAN Ms. Jill Clarke is a 35 y.o. female with PMHx of asthma and sickle cell trait presented with altered mental status.  CT head revealed a small volume subarachnoid hemorrhage and hyperdense dural sinuses with filling defect concerning for dural venous thrombosis.  MRI confirmed an acute extensive dural venous sinus thrombosis with deep watershed white matter  infarct.  Extremely delicate situation, that is why, IR is consulted for thrombectomy and patient is with IR now.  Stroke: Deep watershed white matter infarct, small volume subarachnoid hemorrhage and acute dural venous sinus thrombosis involving superior sagittal sinus into the right internal jugular vein via the dominant right transverse and sigmoid system, likely due to dehydration at this point but looking into hypercoagulable panel, any recent COVID infection.  Code Stroke CT head: positive for a small volume of subarachnoid hemorrhage. There is no midline shift. Hyperdense dural sinuses and cortical veins especially on the right in addition to a filling defect within the transverse sinus on the right is concerning for dural venous thrombosis and may be the source of the patient's subarachnoid hemorrhage.  MRI  and MRA: A). Extensive, acute dural venous sinus thrombosis involving the superior sagittal sinus into the right internal jugular vein via the dominant right transverse and sigmoid system.                                   B).Both veins of Trolard are thrombosed and there is straight sinus/deep cerebral thrombosis as well.                                  C). Deep watershed white matter infarcts, small volume subarachnoid hemorrhage, and left more than right frontoparietal cortical swelling due to #1.  MRV 02/15/20 - Improved flow especially in the deep venous system. Improved flow also seen in the proximal right transverse sinus. Occlusive clot still seen in the superior sagittal, distal right transverse, and right sigmoid dural sinuses. No recanalization of the veins of Trolard.  MRI 02/15/20 - Progressive left perirolandic venous infarction and petechial hemorrhage. New edema in the deep gray nuclei without infarction.  2D Echo - pending  IR Thrombectomy: 02/13/2020- Large chunks of clot removed with revascularization of the TS bilaterally and partially the RT SS and the torcula.Sig  decreased venous congestion of the cortical veins noted post procedure.Post CT brain No ICH  or mass effect or hydrocephalus.  EEG - suggestive of moderate diffuse encephalopathy, nonspecific etiology  LDL 108  HgbA1c 4.9   Hypercoagulable panel: Decreased protein S, but may be an acute phase reactant and should be rechecked as an outpatient in 6 to 8 weeks.  Repeat COVID testing: negative on 02/14/20 and on 02/11/20  VTE prophylaxis -IV heparin    Diet   Diet NPO time specified    No antithrombotic prior to admission, now on IV heparin therapy for dural venous thrombosis in setting of small SAH.   Therapy recommendations: pending  Disposition: pending  Hypertension  Home meds: None  Phenylephrine started 02/15/20  Cleviprex prn - expired . Strict blood pressure control . Long-term BP goal normotensive  Hyperlipidemia  Home meds: None  LDL 108, goal < 70  Start statin when patient stable  Continue statin at discharge  Cerebral Edema  Hypertonic saline 3% - 50 cc's/hr (started 02/13/20)  Na - 153->156->159->156  Other Stroke Risk Factors Sickle Cell Trait: No associated risk found due to sickle cell trait.    Other Active Problems  Asthma   Keppra - seizure prophylaxis - right upper extremity jerking -> concomitant EEG (02/12/20) - without definite evolution in frequency concerning for focal motor seizure.   Leukocytosis - WBC's - 13.3->15.9 (afebrile)  - pending  Hypokalemia - potassium - 2.9->3.0->pending  Anemia - Hgb -  11.0->8.2->8.6->pending  NPO - tube feedings  Intubated   Hypophosphatemia - Phosphorus - 2.0->1.3->1.9  Mastoiditis on MRI.  Unsure if this is contributing to venous sinus thrombosis but will start antibiotics  Hospital day # 3   02/15/2020 7:14 AM Patient remains critically ill with some improvement on follow-up MRI scan precluding MR venogram but new venous infarct in the left parietal region with vein off Trolard thrombosis.   She remains at increased risk of deep venous infarction and may benefit with further endovascular treatment with mechanical thrombectomy have discussed with Dr. Estanislado Pandy and patient's family for further endovascular treatment today.  Continue hypertonic saline with serum sodium goal 150-155.  Continue aggressive hydration and IV heparin to prevent further clot propagation.  Plan to start antibiotics to cover her mastoiditis presuming this may have contributed to her venous sinus thrombosis.  Prognosis remains guarded.  I had a long discussion with the patient's mother over the phone about her condition and discussed plan for treatment and answered questions.  Discussed with Dr. Kipp Brood critical care medicine Dr. Estanislado Pandy interventional neuroradiologist This patient is critically ill and at significant risk of neurological worsening, death and care requires constant monitoring of vital signs, hemodynamics,respiratory and cardiac monitoring, extensive review of multiple databases, frequent neurological assessment, discussion with family, other specialists and medical decision making of high complexity.I have made any additions or clarifications directly to the above note.This critical care time does not reflect procedure time, or teaching time or supervisory time of PA/NP/Med Resident etc but could involve care discussion time.  I spent 60 minutes of neurocritical care time  in the care of  this patient. Antony Contras, MD     To contact Stroke Continuity provider, please refer to [email or web address] After hours, contact General Neurology

## 2020-02-15 NOTE — Progress Notes (Signed)
Heparin level, CBC, and na/BMP labs sent down at 1030. Labs still have not resulted as of 1300. Per lab- the labs were only arrived at lab to be run approx 10 minutes ago and will be "a while" before they are resulted. Pharmacist is aware of delay for heparin result. MD [name] with neurology has been called and informed of delay in sodium result.

## 2020-02-15 NOTE — Progress Notes (Signed)
Patient's sodium trended down to 156 from 161 in the beginning of the night. Neuro MD paged, verbal order to restart 3% at a lowered rate of 50mL/hr.

## 2020-02-15 NOTE — Anesthesia Postprocedure Evaluation (Signed)
Anesthesia Post Note  Patient: Jill Clarke  Procedure(s) Performed: IR WITH ANESTHESIA (N/A )     Patient location during evaluation: SICU Anesthesia Type: General Level of consciousness: sedated Pain management: pain level controlled Vital Signs Assessment: post-procedure vital signs reviewed and stable Respiratory status: patient remains intubated per anesthesia plan Cardiovascular status: stable Postop Assessment: no apparent nausea or vomiting Anesthetic complications: no   No complications documented.  Last Vitals:  Vitals:   02/15/20 1415 02/15/20 1430  BP:    Pulse: 77 91  Resp: 17 18  Temp:    SpO2: 100% 100%    Last Pain:  Vitals:   02/15/20 0800  TempSrc: Axillary                 Jill Clarke

## 2020-02-15 NOTE — Anesthesia Preprocedure Evaluation (Signed)
Anesthesia Evaluation  Patient identified by MRN, date of birth, ID band Patient unresponsive    Reviewed: NPO status , Patient's Chart, lab work & pertinent test resultsPreop documentation limited or incomplete due to emergent nature of procedure.  Airway Mallampati: Intubated   Neck ROM: Full    Dental  (+) Dental Advisory Given   Pulmonary former smoker,    breath sounds clear to auscultation       Cardiovascular  Rhythm:Regular Rate:Normal     Neuro/Psych    GI/Hepatic   Endo/Other  diabetes  Renal/GU      Musculoskeletal   Abdominal   Peds  Hematology   Anesthesia Other Findings   Reproductive/Obstetrics                             Anesthesia Physical Anesthesia Plan  ASA: IV and emergent  Anesthesia Plan: General   Post-op Pain Management:    Induction:   PONV Risk Score and Plan: 3 and Ondansetron, Dexamethasone and Treatment may vary due to age or medical condition  Airway Management Planned: Oral ETT  Additional Equipment: Arterial line  Intra-op Plan:   Post-operative Plan: Post-operative intubation/ventilation  Informed Consent: I have reviewed the patients History and Physical, chart, labs and discussed the procedure including the risks, benefits and alternatives for the proposed anesthesia with the patient or authorized representative who has indicated his/her understanding and acceptance.       Plan Discussed with: CRNA  Anesthesia Plan Comments:         Anesthesia Quick Evaluation

## 2020-02-16 ENCOUNTER — Encounter (HOSPITAL_COMMUNITY): Payer: Self-pay | Admitting: Interventional Radiology

## 2020-02-16 ENCOUNTER — Inpatient Hospital Stay (HOSPITAL_COMMUNITY): Payer: 59

## 2020-02-16 DIAGNOSIS — I959 Hypotension, unspecified: Secondary | ICD-10-CM

## 2020-02-16 DIAGNOSIS — I8290 Acute embolism and thrombosis of unspecified vein: Secondary | ICD-10-CM

## 2020-02-16 DIAGNOSIS — J9601 Acute respiratory failure with hypoxia: Secondary | ICD-10-CM

## 2020-02-16 DIAGNOSIS — G08 Intracranial and intraspinal phlebitis and thrombophlebitis: Secondary | ICD-10-CM | POA: Diagnosis not present

## 2020-02-16 DIAGNOSIS — G936 Cerebral edema: Secondary | ICD-10-CM

## 2020-02-16 DIAGNOSIS — I6602 Occlusion and stenosis of left middle cerebral artery: Secondary | ICD-10-CM

## 2020-02-16 DIAGNOSIS — I6330 Cerebral infarction due to thrombosis of unspecified cerebral artery: Secondary | ICD-10-CM

## 2020-02-16 DIAGNOSIS — I609 Nontraumatic subarachnoid hemorrhage, unspecified: Secondary | ICD-10-CM

## 2020-02-16 DIAGNOSIS — D62 Acute posthemorrhagic anemia: Secondary | ICD-10-CM

## 2020-02-16 LAB — CBC WITH DIFFERENTIAL/PLATELET
Abs Immature Granulocytes: 0.69 10*3/uL — ABNORMAL HIGH (ref 0.00–0.07)
Basophils Absolute: 0 10*3/uL (ref 0.0–0.1)
Basophils Relative: 0 %
Eosinophils Absolute: 0 10*3/uL (ref 0.0–0.5)
Eosinophils Relative: 0 %
HCT: 23.2 % — ABNORMAL LOW (ref 36.0–46.0)
Hemoglobin: 7.2 g/dL — ABNORMAL LOW (ref 12.0–15.0)
Immature Granulocytes: 5 %
Lymphocytes Relative: 17 %
Lymphs Abs: 2.5 10*3/uL (ref 0.7–4.0)
MCH: 29 pg (ref 26.0–34.0)
MCHC: 31 g/dL (ref 30.0–36.0)
MCV: 93.5 fL (ref 80.0–100.0)
Monocytes Absolute: 1.3 10*3/uL — ABNORMAL HIGH (ref 0.1–1.0)
Monocytes Relative: 9 %
Neutro Abs: 10.6 10*3/uL — ABNORMAL HIGH (ref 1.7–7.7)
Neutrophils Relative %: 69 %
Platelets: 480 10*3/uL — ABNORMAL HIGH (ref 150–400)
RBC: 2.48 MIL/uL — ABNORMAL LOW (ref 3.87–5.11)
RDW: 14.2 % (ref 11.5–15.5)
WBC: 15.2 10*3/uL — ABNORMAL HIGH (ref 4.0–10.5)
nRBC: 0 % (ref 0.0–0.2)

## 2020-02-16 LAB — CARDIOLIPIN ANTIBODIES, IGG, IGM, IGA
Anticardiolipin IgA: <9 APL U/mL (ref 0–11)
Anticardiolipin IgG: <9 GPL U/mL (ref 0–14)
Anticardiolipin IgM: 21 MPL U/mL — ABNORMAL HIGH (ref 0–12)

## 2020-02-16 LAB — HEMOGLOBIN AND HEMATOCRIT, BLOOD
HCT: 22.7 % — ABNORMAL LOW (ref 36.0–46.0)
HCT: 25.7 % — ABNORMAL LOW (ref 36.0–46.0)
Hemoglobin: 7 g/dL — ABNORMAL LOW (ref 12.0–15.0)
Hemoglobin: 8.1 g/dL — ABNORMAL LOW (ref 12.0–15.0)

## 2020-02-16 LAB — BASIC METABOLIC PANEL
Anion gap: 5 (ref 5–15)
BUN: 6 mg/dL (ref 6–20)
CO2: 27 mmol/L (ref 22–32)
Calcium: 8.5 mg/dL — ABNORMAL LOW (ref 8.9–10.3)
Chloride: 118 mmol/L — ABNORMAL HIGH (ref 98–111)
Creatinine, Ser: 0.39 mg/dL — ABNORMAL LOW (ref 0.44–1.00)
GFR, Estimated: >60 mL/min (ref >60)
Glucose, Bld: 131 mg/dL — ABNORMAL HIGH (ref 70–99)
Potassium: 3.6 mmol/L (ref 3.5–5.1)
Sodium: 150 mmol/L — ABNORMAL HIGH (ref 135–145)

## 2020-02-16 LAB — GLUCOSE, CAPILLARY
Glucose-Capillary: 104 mg/dL — ABNORMAL HIGH (ref 70–99)
Glucose-Capillary: 114 mg/dL — ABNORMAL HIGH (ref 70–99)
Glucose-Capillary: 120 mg/dL — ABNORMAL HIGH (ref 70–99)
Glucose-Capillary: 122 mg/dL — ABNORMAL HIGH (ref 70–99)
Glucose-Capillary: 126 mg/dL — ABNORMAL HIGH (ref 70–99)
Glucose-Capillary: 130 mg/dL — ABNORMAL HIGH (ref 70–99)
Glucose-Capillary: 139 mg/dL — ABNORMAL HIGH (ref 70–99)

## 2020-02-16 LAB — ECHOCARDIOGRAM COMPLETE
Area-P 1/2: 4.31 cm2
Height: 65 in
S' Lateral: 3.2 cm
Weight: 2462.1 oz

## 2020-02-16 LAB — PREPARE RBC (CROSSMATCH)

## 2020-02-16 LAB — SODIUM
Sodium: 153 mmol/L — ABNORMAL HIGH (ref 135–145)
Sodium: 153 mmol/L — ABNORMAL HIGH (ref 135–145)
Sodium: 153 mmol/L — ABNORMAL HIGH (ref 135–145)

## 2020-02-16 LAB — HEPARIN LEVEL (UNFRACTIONATED): Heparin Unfractionated: 0.45 IU/mL (ref 0.30–0.70)

## 2020-02-16 IMAGING — CT CT HEAD W/O CM
4 series · 15 of 47 positions shown, 17 images · non-contrast
Comparison: Prior CT and MRI from [DATE].

CLINICAL DATA: Follow-up examination for acute stroke.

EXAM:
CT HEAD WITHOUT CONTRAST
TECHNIQUE: Contiguous axial images were obtained from the base of the skull
through the vertex without intravenous contrast.

[Series 3: head wo · axial · 0.43mm/px · z∈[+1312,+1432]mm · 7 of 33 slices shown, 9 images]
[im 5/33  brain]
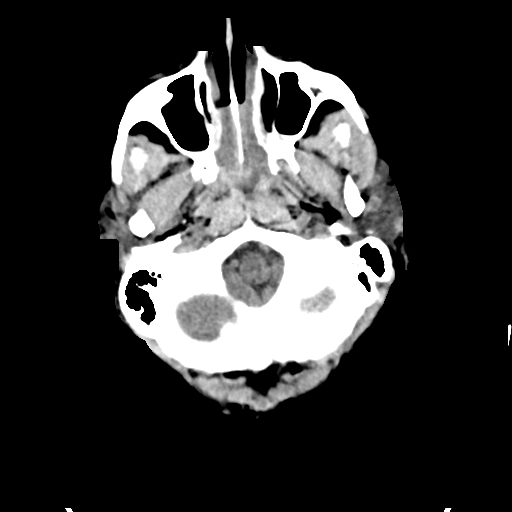
[im 5/33  bone]
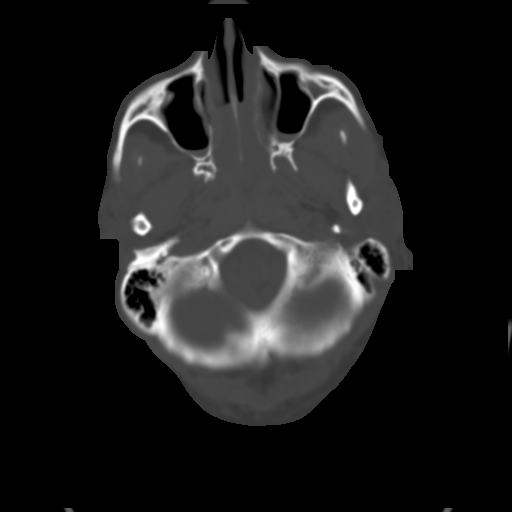
[im 9/33  brain]
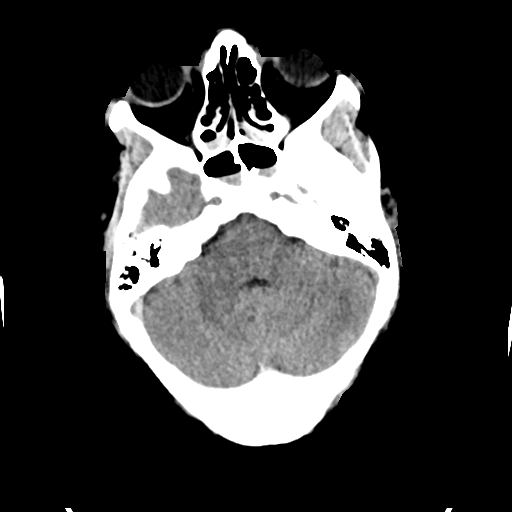
[im 13/33  brain]
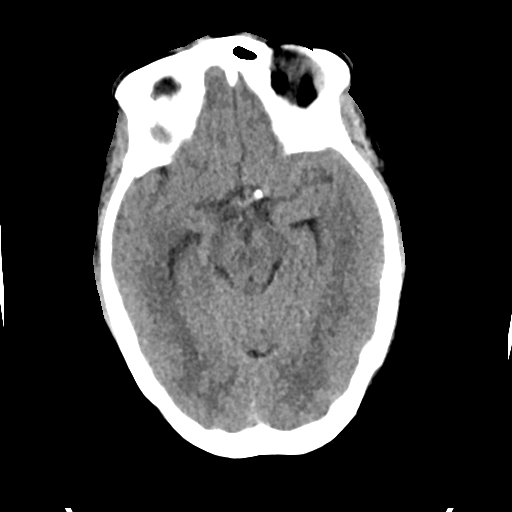
[im 17/33  brain]
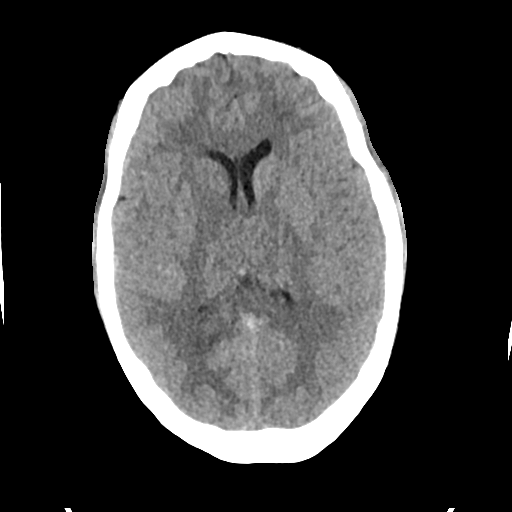
[im 21/33  brain]
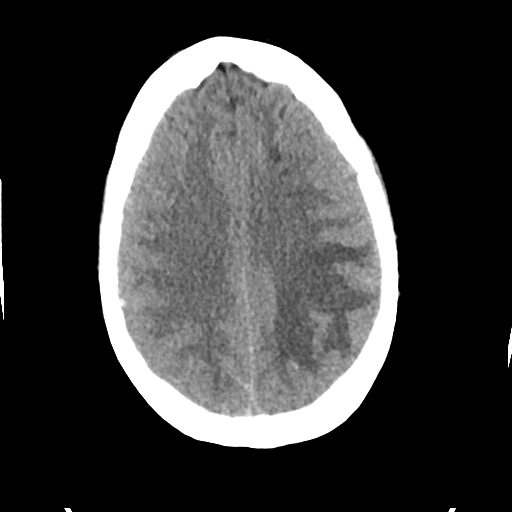
[im 21/33  bone]
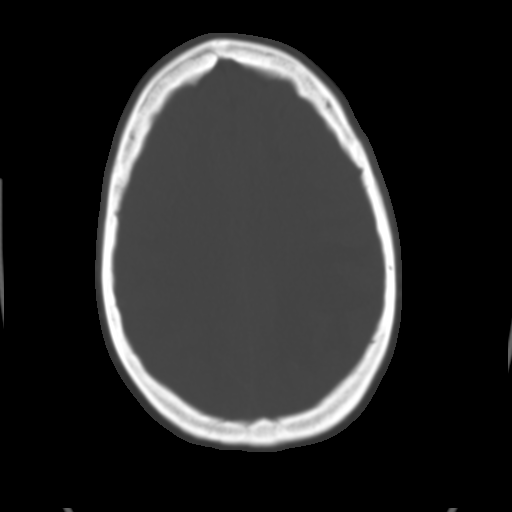
[im 25/33  brain]
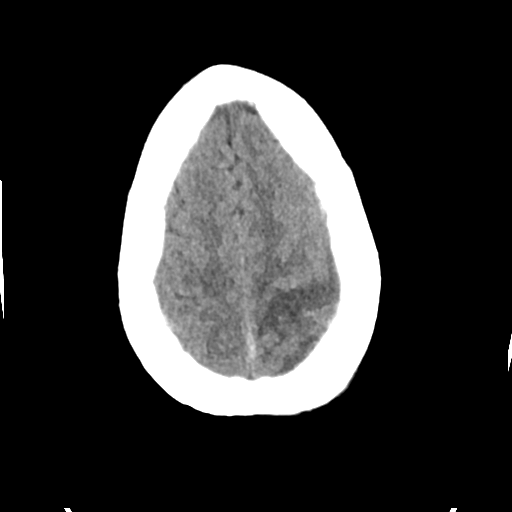
[im 29/33  brain]
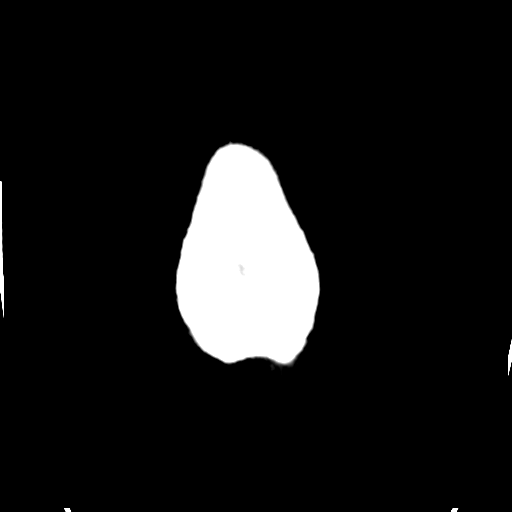

[Series 4: head bone · axial · 0.43mm/px · z∈[+1308,+1324]mm · 2 of 82 slices shown]
[im 9/82  bone]
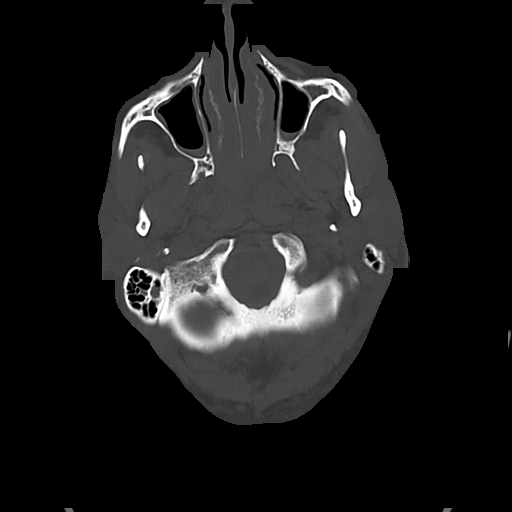
[im 17/82  bone]
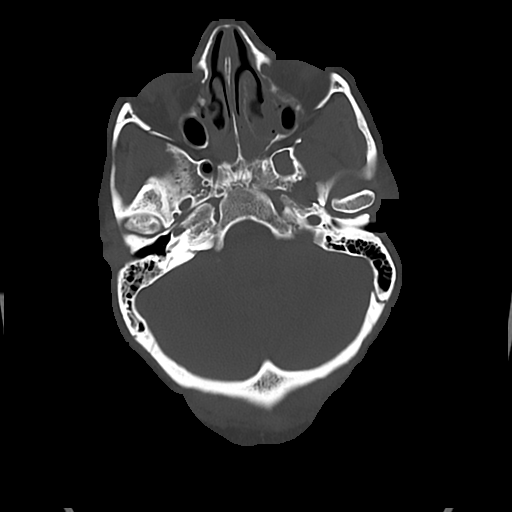

[Series 5: cor soft · coronal · 0.32mm/px · 3 of 69 slices shown]
[im 23/69  brain]
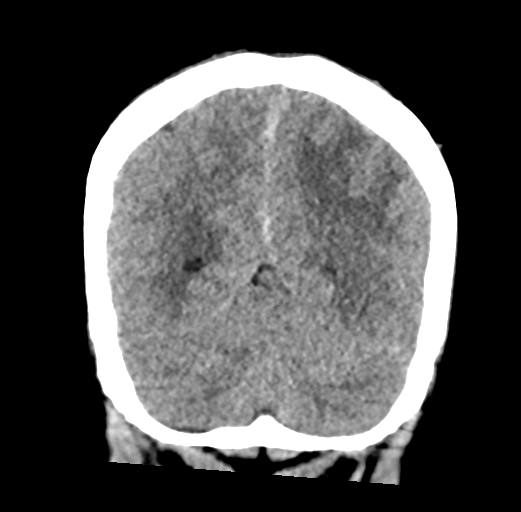
[im 31/69  brain]
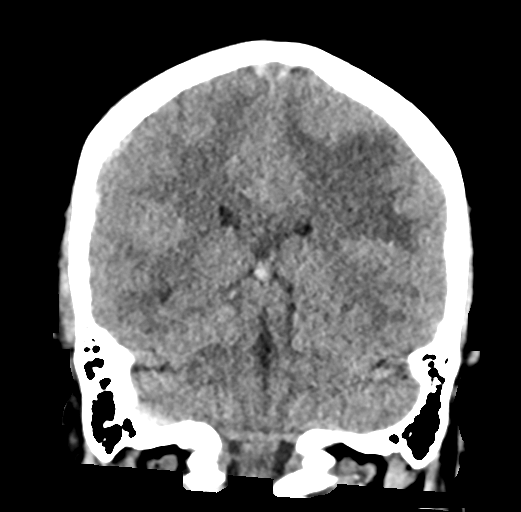
[im 38/69  brain]
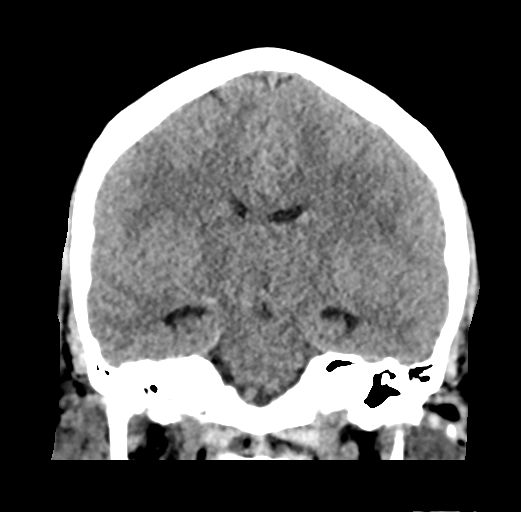

[Series 6: sag soft · sagittal · 0.32mm/px · 3 of 53 slices shown]
[im 18/53  brain]
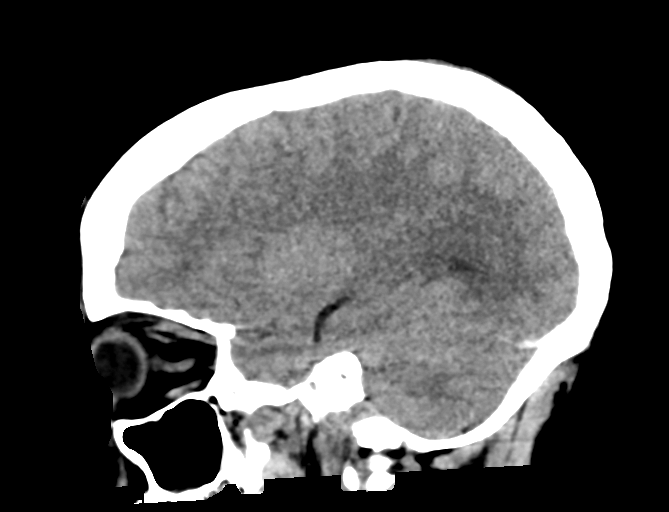
[im 27/53  brain]
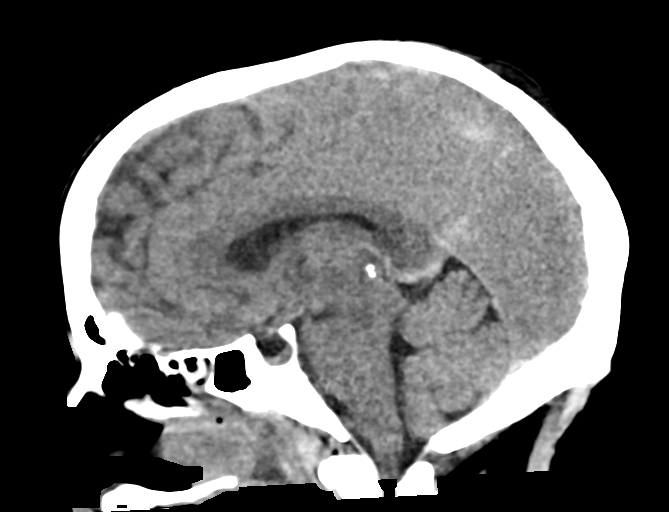
[im 35/53  brain]
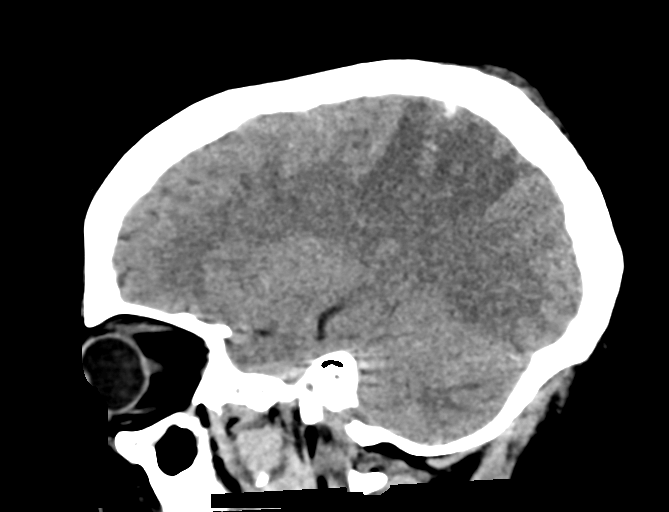

[15 of 47 positions shown; findings below may reference images not displayed]

FINDINGS: Brain: Continued interval decrease in conspicuity of scattered small
volume subarachnoid hemorrhage involving both frontal parietal
convexities, now only faintly visible and nearly resolved. No
significant basilar cistern hemorrhage now evident. Continued
interval evolution of bilateral watershed territory infarcts, most
pronounced at the left frontoparietal convexity. Overall size and
distribution is relatively similar. No hemorrhagic transformation
evident by CT. Previously noted edema involving the deep gray nuclei
difficult to visualize by CT, although there are suspected
persistent changes.

No new hemorrhage or acute ischemic changes. No midline shift or
significant mass effect. No hydrocephalus or extra-axial fluid
collection.

Vascular: Persistent but improved hyperdensity seen about the veins
of true large since prior CT. Likewise, the superior sagittal sinus
appears less dense and engorged as compared to previous. Remainder
of the dural sinuses also appear less dense. No hyperdense arterial
vessel.

Skull: Scalp soft tissues demonstrate no new finding. Calvarium
unchanged.

Sinuses/Orbits: Globes and orbital soft tissues within normal
limits. Extensive mucosal thickening with air-fluid levels noted
throughout the paranasal sinuses similar to previous. Trace right
mastoid effusion.

Other: None.
IMPRESSION: 1. Continued interval decrease in conspicuity of scattered small
volume subarachnoid hemorrhage involving both frontoparietal
convexities, now only faintly visible and nearly resolved.
2. Continued interval evolution of bilateral watershed territory
infarcts, most pronounced at the left frontoparietal convexity.
Overall size and distribution is relatively similar. No hemorrhagic
transformation by CT.
3. Persistent but improved hyperdensity about the veins of [place]
since prior CT. Likewise, the remainder of the dural sinuses also
appear less dense and engorged as compared to previous.
4. No other new acute intracranial abnormality.

## 2020-02-16 MED ORDER — SODIUM CHLORIDE 0.9% IV SOLUTION: Freq: Once | INTRAVENOUS | Status: AC

## 2020-02-16 MED ORDER — DEXMEDETOMIDINE HCL IN NACL 400 MCG/100ML IV SOLN
0.4000 ug/kg/h | INTRAVENOUS | Status: DC
  Administered 2020-02-16 (×2): 0.4 ug/kg/h via INTRAVENOUS
  Filled 2020-02-16 (×2): qty 100

## 2020-02-16 MED ORDER — POTASSIUM CHLORIDE 20 MEQ PO PACK
20.0000 meq | PACK | ORAL | Status: AC
  Administered 2020-02-16 (×2): 20 meq
  Filled 2020-02-16 (×2): qty 1

## 2020-02-16 MED ORDER — POTASSIUM CHLORIDE 10 MEQ/50ML IV SOLN
10.0000 meq | INTRAVENOUS | Status: AC
Start: 2020-02-16 — End: 2020-02-16
  Administered 2020-02-16 (×4): 10 meq via INTRAVENOUS
  Filled 2020-02-16 (×4): qty 50

## 2020-02-16 MED ORDER — OSMOLITE 1.5 CAL PO LIQD
1000.0000 mL | ORAL | Status: DC
  Administered 2020-02-16: 1000 mL

## 2020-02-16 MED FILL — Phenylephrine HCl IV Soln 10 MG/ML: INTRAVENOUS | Qty: 10 | Status: AC

## 2020-02-16 MED FILL — Sodium Chloride IV Soln 0.9%: INTRAVENOUS | Qty: 250 | Status: AC

## 2020-02-16 NOTE — Progress Notes (Signed)
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 Corliss Skainseveshwar; with residual thrombus load s/p cerebral arteriogram with mechanical thrombectomy of superior sagittal sinus, straight sinus, vein of galen, bilateral IJs, and right transverse sinus via right (venous) and left (arterial) femoral approach 02/15/2020 by Dr. Corliss Skainseveshwar. Patient laying in bed intubated with sedation. She does not open eyes to voice and does not follow simple commands. Can spontaneously move LUE, LLE slightly withdraws from pain, no movements of right side. Right (venous) and left (arterial) femoral puncture sites c/d/i.  CT head this AM: 1. Continued interval decrease in conspicuity of scattered small volume subarachnoid hemorrhage involving both frontoparietal convexities, now only faintly visible and nearly resolved. 2. Continued interval evolution of bilateral watershed territory infarcts, most pronounced at the left frontoparietal convexity. Overall size and distribution is relatively similar. No hemorrhagic transformation by CT. 3. Persistent but improved hyperdensity about the veins of [place]rolard since prior CT. Likewise, the remainder of the dural sinuses also appear less dense and engorged as compared to previous. 4. No other new acute intracranial abnormality.   Allergies: Patient has no known allergies.  Medications: Prior to Admission medications   Not on File     Vital Signs: BP 116/64   Pulse (!) 118   Temp 98.7 F (37.1 C) (Axillary)   Resp 19   Ht 5\' 5"  (1.651 m)    Wt 153 lb 14.1 oz (69.8 kg)   LMP 02/03/2020 (Approximate)   SpO2 100%   BMI 25.61 kg/m   Physical Exam Constitutional:      General: She is not in acute distress.    Comments: Intubated with sedation.  Pulmonary:     Effort: Pulmonary effort is normal. No respiratory distress.     Comments: Intubated with sedation. Skin:    General: Skin is warm and dry.     Comments: Right (venous) and left (arterial) femoral puncture sites both soft without active bleeding or hematoma.  Neurological:     Comments: Intubated with sedation. She does not open eyes to voice and does not follow simple commands. PERRL bilaterally. Left gaze preference. Can spontaneously move LUE, LLE slightly withdraws from pain, no movements of right side. Distal pulses (DPs) 2+ bilaterally.     Imaging: DG Abd 1 View  Result Date: 02/14/2020 CLINICAL DATA:  OG tube placement. EXAM: ABDOMEN - 1 VIEW COMPARISON:  None. FINDINGS: Enteric tube tip and side-port project over the stomach. Lung bases are clear. IMPRESSION: Enteric tube tip and side-port project over the stomach. Electronically Signed   By: Annia Beltrew  Davis M.D.   On: 02/14/2020 13:07   CT Head Wo Contrast  Result Date: 02/16/2020 CLINICAL DATA:  Follow-up examination for acute stroke. EXAM: CT HEAD WITHOUT CONTRAST TECHNIQUE: Contiguous axial images were obtained from the base of the skull through the vertex without intravenous contrast. COMPARISON:  Prior CT and MRI from 02/15/2020. FINDINGS: Brain: Continued interval decrease in conspicuity of scattered small volume subarachnoid hemorrhage involving both frontal parietal convexities, now only faintly visible and nearly resolved. No significant basilar cistern hemorrhage now evident. Continued interval evolution of bilateral watershed territory infarcts, most  pronounced at the left frontoparietal convexity. Overall size and distribution is relatively similar. No hemorrhagic transformation evident by CT.  Previously noted edema involving the deep gray nuclei difficult to visualize by CT, although there are suspected persistent changes. No new hemorrhage or acute ischemic changes. No midline shift or significant mass effect. No hydrocephalus or extra-axial fluid collection. Vascular: Persistent but improved hyperdensity seen about the veins of true large since prior CT. Likewise, the superior sagittal sinus appears less dense and engorged as compared to previous. Remainder of the dural sinuses also appear less dense. No hyperdense arterial vessel. Skull: Scalp soft tissues demonstrate no new finding. Calvarium unchanged. Sinuses/Orbits: Globes and orbital soft tissues within normal limits. Extensive mucosal thickening with air-fluid levels noted throughout the paranasal sinuses similar to previous. Trace right mastoid effusion. Other: None. IMPRESSION: 1. Continued interval decrease in conspicuity of scattered small volume subarachnoid hemorrhage involving both frontoparietal convexities, now only faintly visible and nearly resolved. 2. Continued interval evolution of bilateral watershed territory infarcts, most pronounced at the left frontoparietal convexity. Overall size and distribution is relatively similar. No hemorrhagic transformation by CT. 3. Persistent but improved hyperdensity about the veins of Trolard since prior CT. Likewise, the remainder of the dural sinuses also appear less dense and engorged as compared to previous. 4. No other new acute intracranial abnormality. Electronically Signed   By: Jeannine Boga M.D.   On: 02/16/2020 00:59   CT HEAD WO CONTRAST  Result Date: 02/14/2020 CLINICAL DATA:  Stroke, follow up EXAM: CT HEAD WITHOUT CONTRAST TECHNIQUE: Contiguous axial images were obtained from the base of the skull through the vertex without intravenous contrast. COMPARISON:  02/12/2020. FINDINGS: Brain: Decreased conspicuity of small volume subarachnoid hemorrhage along the bilateral  frontoparietal regions within the basilar cistern. No new site of intracranial hemorrhage. Known small bilateral cerebral infarcts are not well demonstrated on this exam. Increased conspicuity of left cerebral edema. No midline shift or mass lesion.  No ventriculomegaly. Vascular: Hyperdense appearance of the cortical veins, posterosuperior sagittal sinus and right transverse sinus is less conspicuous than prior exam. No hyperdense arterial vessels. Skull: Negative for fracture or focal lesion. Sinuses/Orbits: No acute finding. Mild pansinus mucosal thickening with layering secretions. Other: None. IMPRESSION: Small volume bilateral frontoparietal and basilar cistern subarachnoid hemorrhage is unchanged. No new intracranial hemorrhage. Small bilateral cerebral infarcts are not well demonstrated on this exam. Increased conspicuity of left cerebral edema. Hyperdense appearance of the cortical veins, superior sagittal and right transverse sinus is less conspicuous than prior exam. Electronically Signed   By: Primitivo Gauze M.D.   On: 02/14/2020 09:58   MR BRAIN WO CONTRAST  Result Date: 02/15/2020 CLINICAL DATA:  Follow-up dural sinus thrombosis. Interval heparin drip and mechanical thrombectomy. EXAM: MRI HEAD WITHOUT CONTRAST MRV HEAD WITHOUT CONTRAST TECHNIQUE: Multiplanar, multiecho pulse sequences of the brain and surrounding structures were obtained without intravenous contrast. Angiographic images of the intracranial venous structures were obtained using MRV technique without intravenous contrast. COMPARISON:  Brain MRI from 3 days ago FINDINGS: MRI HEAD WITHOUT CONTRAST Brain: Bilateral deep watershed white matter infarcts which appears similar in extent. The degree of restricted diffusion appears greater in these infarcts, compatible with interval evolution. Venous infarct along the high left central sulcus is progressed in extent. Small infarct in the right paramedian splenium of the corpus callosum  with mild increase. T2 signal without restricted diffusion in the right more than left basal ganglia and in the bilateral thalamus, progressed. There is increased FLAIR signal along  the cerebral convexities which may be from engorged vessels or the subarachnoid blood seen on admission CT. On gradient imaging there is less vessel congestion suspected. Increased petechial hemorrhage along the left-sided venous infarct. Vascular: T1 hyperintense evolution of the dural venous sinus thrombosis which is most confluent in the superior sagittal sinus, right transverse sinus, and right sigmoid sinus. Clot is also seen in the bilateral vein of [place]rolard. Skull and upper cervical spine: Normal marrow signal Sinuses/Orbits: Negative MR VENOGRAM WITHOUT CONTRAST There has been improvement of venous flow after thrombectomy. Improvements are primarily seen at the level of the torcula and proximal right transverse sinus. There is also now straight sinus and internal cerebral vein flow. Occlusive thrombus is still seen at the level of the superior sagittal sinus, distal right transverse, and right sigmoid sinuses. Veins of Trolard remain thrombosed, best seen by noncontrast T1 weighted imaging. IMPRESSION: Brain MRI: 1. Progressive left perirolandic venous infarction and petechial hemorrhage. 2. New edema in the deep gray nuclei without infarction. Intracranial MRV: 1. Improved flow especially in the deep venous system. Improved flow also seen in the proximal right transverse sinus. 2. Occlusive clot still seen in the superior sagittal, distal right transverse, and right sigmoid dural sinuses. No recanalization of the veins of Trolard. Electronically Signed   By: Monte Fantasia M.D.   On: 02/15/2020 05:10   MR Venogram Head  Result Date: 02/15/2020 CLINICAL DATA:  Follow-up dural sinus thrombosis. Interval heparin drip and mechanical thrombectomy. EXAM: MRI HEAD WITHOUT CONTRAST MRV HEAD WITHOUT CONTRAST TECHNIQUE: Multiplanar,  multiecho pulse sequences of the brain and surrounding structures were obtained without intravenous contrast. Angiographic images of the intracranial venous structures were obtained using MRV technique without intravenous contrast. COMPARISON:  Brain MRI from 3 days ago FINDINGS: MRI HEAD WITHOUT CONTRAST Brain: Bilateral deep watershed white matter infarcts which appears similar in extent. The degree of restricted diffusion appears greater in these infarcts, compatible with interval evolution. Venous infarct along the high left central sulcus is progressed in extent. Small infarct in the right paramedian splenium of the corpus callosum with mild increase. T2 signal without restricted diffusion in the right more than left basal ganglia and in the bilateral thalamus, progressed. There is increased FLAIR signal along the cerebral convexities which may be from engorged vessels or the subarachnoid blood seen on admission CT. On gradient imaging there is less vessel congestion suspected. Increased petechial hemorrhage along the left-sided venous infarct. Vascular: T1 hyperintense evolution of the dural venous sinus thrombosis which is most confluent in the superior sagittal sinus, right transverse sinus, and right sigmoid sinus. Clot is also seen in the bilateral vein of Trolard. Skull and upper cervical spine: Normal marrow signal Sinuses/Orbits: Negative MR VENOGRAM WITHOUT CONTRAST There has been improvement of venous flow after thrombectomy. Improvements are primarily seen at the level of the torcula and proximal right transverse sinus. There is also now straight sinus and internal cerebral vein flow. Occlusive thrombus is still seen at the level of the superior sagittal sinus, distal right transverse, and right sigmoid sinuses. Veins of Trolard remain thrombosed, best seen by noncontrast T1 weighted imaging. IMPRESSION: Brain MRI: 1. Progressive left perirolandic venous infarction and petechial hemorrhage. 2. New  edema in the deep gray nuclei without infarction. Intracranial MRV: 1. Improved flow especially in the deep venous system. Improved flow also seen in the proximal right transverse sinus. 2. Occlusive clot still seen in the superior sagittal, distal right transverse, and right sigmoid dural sinuses. No recanalization of  the veins of Trolard. Electronically Signed   By: Monte Fantasia M.D.   On: 02/15/2020 05:10   DG CHEST PORT 1 VIEW  Result Date: 02/14/2020 CLINICAL DATA:  Endotracheal tube placement. EXAM: PORTABLE CHEST 1 VIEW COMPARISON:  One day prior FINDINGS: Endotracheal tube terminates 3.9 cm above carina. Nasogastric tube extends beyond the inferior aspect of the film. Right-sided PICC line is difficult to follow centrally. Most likely with tip at cavoatrial junction. Midline trachea. Normal heart size. No pleural effusion or pneumothorax. Improved left lower lobe aeration with mild bibasilar atelectasis remaining. IMPRESSION: Appropriate position of endotracheal tube. Bibasilar atelectasis with significantly improved left lower lobe aeration. Electronically Signed   By: Abigail Miyamoto M.D.   On: 02/14/2020 13:08   DG Chest Port 1 View  Result Date: 02/13/2020 CLINICAL DATA:  01/19/2014 EXAM: PORTABLE CHEST 1 VIEW COMPARISON:  Respiratory FINDINGS: Later endotracheal tube is seen with its tip just within the right mainstem bronchus. Nasogastric tube tip is seen within the proximal body of the stomach with its proximal side hole at the expected gastroesophageal junction. Retrocardiac opacity likely relates to partial left lower lobe collapse. Right lung is clear. No pneumothorax or pleural effusion. Cardiac size within normal limits. Pulmonary vascularity is normal. IMPRESSION: Right mainstem intubation. Withdrawal of the endotracheal tube by approximately 2.5 cm may better position the catheter. Partial left lower lobe collapse. Nasogastric tube proximal side hole at the gastroesophageal junction.  Advancement by roughly 5 cm may better position the catheter. These results will be called to the ordering clinician or representative by the Radiologist Assistant, and communication documented in the PACS or [employer]. Electronically Signed   By: Fidela Salisbury MD   On: 02/13/2020 23:35   EEG adult  Result Date: 02/12/2020 Lora Havens, MD     02/12/2020  4:06 PM Patient Name: Jill Clarke MRN: [id number] Epilepsy Attending: Lora Havens Referring Physician/Provider: Dr. Kathrynn Speed Date: 02/12/2020 Duration: 25.25 minutes Patient history: 35 year old female with altered mental status.  EEG to evaluate for seizures. Level of alertness: Awake, asleep AEDs during EEG study: None Technical aspects: This EEG study was done with scalp electrodes positioned according to the 10-20 International system of electrode placement. Electrical activity was acquired at a sampling rate of 500Hz  and reviewed with a high frequency filter of 70Hz  and a low frequency filter of 1Hz . EEG data were recorded continuously and digitally stored. Description: Np posterior dominant rhythm was seen. Sleep was characterized by sleep spindles (12 to 14 Hz), maximal frontocentral region.  EEG showed continuous generalized 3 to 5 Hz theta and delta slowing.  Hyperventilation and photic stimulation were not performed. At 1531, patient was noted to move her right arm towards her chest followed by slow but rhythmic jerking of right upper extremity.  Concomitant EEG showed high amplitude 2 to 3 Hz rhythmic delta slowing in bitemporal region which gradually increases in amplitude and appears more sharply contoured without definite evolution in frequency. ABNORMALITY -Continue slow, generalized IMPRESSION: This study is suggestive of moderate diffuse encephalopathy, nonspecific etiology.  At 1531, patient was noted to have right upper extremity jerking.  Concomitant EEG showed high amplitude 2 to 3 Hz rhythmic delta slowing  in bitemporal region with evolution in morphology and amplitude but without definite evolution in frequency concerning for focal motor seizure. Dr. Leonel Ramsay was notified. Priyanka O Yadav   [place] EKG SITE RITE  Result Date: 02/13/2020 If [facility] image not attached, placement could not be confirmed  due to current cardiac rhythm.   Labs:  CBC: Recent Labs    02/13/20 0333 02/13/20 2058 02/14/20 1111 02/15/20 1032 02/15/20 1240 02/16/20 0419  WBC 13.3*  --  15.9* 14.0*  --  15.2*  HGB 11.0*   < > 8.6* 8.2* 7.1* 7.2*  HCT 34.1*   < > 27.7* 26.6* 21.0* 23.2*  PLT 564*  --  575* 536*  --  480*   < > = values in this interval not displayed.    COAGS: Recent Labs    02/12/20 0417  INR 1.2    BMP: Recent Labs    02/14/20 1111 02/14/20 1821 02/15/20 1032 02/15/20 1240 02/15/20 1612 02/15/20 2300 02/16/20 0419  NA 156*   < > 153* 154* 149* 150* 150*  K 3.0*  --  3.2* 2.6*  --  3.1* 3.6  CL 124*  --  116* 114*  --  116* 118*  CO2 24  --  27  --   --  26 27  GLUCOSE 97  --  103* 96  --  138* 131*  BUN <5*  --  <5* <3*  --  5* 6  CALCIUM 8.1*  --  8.6*  --   --  8.7* 8.5*  CREATININE 0.47  --  0.42* 0.20*  --  0.41* 0.39*  GFRNONAA >60  --  >60  --   --  >60 >60   < > = values in this interval not displayed.    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 Estanislado Pandy; with residual thrombus load s/p cerebral arteriogram with mechanical thrombectomy of superior sagittal sinus, straight sinus, vein of galen, bilateral IJs, and right transverse sinus via  right (venous) and left (arterial) femoral approach 02/15/2020 by Dr. Estanislado Pandy. Patient's condition guarded- remains intubated/sedated, does not open eyes to voice, does not follow simple command, can spontaneously move LUE, LLE slightly withdraws from pain, no movements of right side. Right (venous) and left (arterial) femoral puncture sites stable, distal pulses stable. For EEG today. Further plans per neurology/CCM- appreciate and agree with management. NIR to follow.   Electronically Signed: Earley Abide, PA-C 02/16/2020, 10:09 AM   I spent a total of 25 Minutes at the the patient's bedside AND on the patient's hospital floor or unit, greater than 50% of which was counseling/coordinating care for dural venous sinus thrombosis s/p revascularization.

## 2020-02-16 NOTE — Progress Notes (Signed)
  Echocardiogram 2D Echocardiogram has been performed.  Jill Clarke 02/16/2020, 11:44 AM

## 2020-02-16 NOTE — Progress Notes (Signed)
SLP Cancellation Note  Patient Details Name: Jill Clarke MRN: [id number] DOB: [birth year]   Cancelled treatment:       Reason Eval/Treat Not Completed: Patient not medically ready (remains on vent this am). Will continue to follow.    Osie Bond., M.A. [facility] Pager [phone] Office [phone]  02/16/2020, 7:24 AM

## 2020-02-16 NOTE — Plan of Care (Incomplete)
  Temp:  [97.8 F (36.6 C)-98.7 F (37.1 C)] 98.7 F (37.1 C) (01/24 0800) Pulse Rate:  [51-119] 108 (01/24 1100) Resp:  [15-21] 20 (01/24 1100) BP: (99-138)/(40-102) 99/80 (01/24 1100) SpO2:  [100 %] 100 % (01/24 1100) Arterial Line BP: (106-145)/(59-98) 116/59 (01/24 1100) FiO2 (%):  [30 %] 30 % (01/24 0835) Weight:  [69.8 kg] 69.8 kg (01/24 0500)  General - Well nourished, well developed, intubated on sedation.  Ophthalmologic - fundi not visualized due to noncooperation.  Cardiovascular - Regular rhythm but tachycardia.  Neuro - intubated on sedation, eyes closed and barely open on repetitive stimulation, following commands. With forced eye opening, eyes in slight upper gaze position bilaterally, conjugate, blinking to visual threat, doll's eyes present, not tracking, PERRL. Corneal reflex present, gag and cough present. Breathing over the vent.  Facial symmetry not able to test due to ET tube.  Tongue protrusion not cooperative. On pain stimulation, withdraw in left upper and lower extremities against gravity but slight withdraw no the right UE and mild withdraw right LE. DTR 1+ and no babinski. Sensation, coordination and gait not tested.

## 2020-02-16 NOTE — Progress Notes (Addendum)
STROKE TEAM PROGRESS NOTE  INTERVAL HISTORY Patient evaluated at bedside this morning, no family present in the room.  EEG technician in the room.  Patient remains intubated and on fentanyl and propofol.  Difficult to arouse, only responds to noxious stimuli.  Obtunded, not speaking or following any commands.  She remains quite purposeful with movements in the left arm and left leg but has right hemiparesis.  She had successful revascularization yesterday and both groin sheaths were removed, distal pulses feeble in both feet this morning.  Patient is on IV heparin and aggressive IV fluids.  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  CT scan this morning does not show any acute abnormality.  Sodium level is within normal goal level 150.  Blood pressure well controlled.  Her hemoglobin is decreased after procedure, planning to repeat H&H in afternoon today.  EEG does not show any seizure activity.  Neurological exam unchanged.  Vitals:   02/16/20 1000 02/16/20 1100 02/16/20 1200 02/16/20 1300  BP: 113/90 99/80 115/75 115/74  Pulse: 90 (!) 108 84 75  Resp: 17 20 18 18   Temp:   98.3 F (36.8 C)   TempSrc:   Axillary   SpO2: 100% 100% 100% 100%  Weight:      Height:       CBC:  Recent Labs  Lab 02/14/20 1111 02/15/20 1032 02/15/20 1240 02/16/20 0419  WBC 15.9* 14.0*  --  15.2*  NEUTROABS 9.8*  --   --  10.6*  HGB 8.6* 8.2* 7.1* 7.2*  HCT 27.7* 26.6* 21.0* 23.2*  MCV 93.9 94.7  --  93.5  PLT 575* 536*  --  409*   Basic Metabolic Panel:  Recent Labs  Lab 02/15/20 0513 02/15/20 1032 02/15/20 1612 02/15/20 2300 02/16/20 0419 02/16/20 1006  NA 156*   < > 149* 150* 150* 153*  K  --    < >  --  3.1* 3.6  --   CL  --    < >  --  116* 118*  --   CO2  --    < >  --  26 27  --   GLUCOSE  --    < >  --  138* 131*  --   BUN  --    < >  --  5* 6  --   CREATININE  --    < >  --  0.41* 0.39*  --   CALCIUM  --    < >   --  8.7* 8.5*  --   MG 1.7  --  1.6*  --   --   --   PHOS 1.9*  --  2.7  --   --   --    < > = values in this interval not displayed.   Lipid Panel:  Recent Labs  Lab 02/13/20 0333 02/14/20 0505  CHOL 148  --   TRIG 60 72  HDL 28*  --   CHOLHDL 5.3  --   VLDL 12  --   LDLCALC 108*  --    HgbA1c:  Recent Labs  Lab 02/13/20 0333  HGBA1C 4.9   Urine Drug Screen:  Recent Labs  Lab 02/12/20 0701  LABOPIA NONE DETECTED  COCAINSCRNUR NONE DETECTED  LABBENZ NONE DETECTED  AMPHETMU NONE DETECTED  THCU NONE DETECTED  LABBARB NONE DETECTED    Alcohol Level  Recent Labs  Lab 02/12/20 0417  ETH <10    IMAGING past 24 hours  CT Head wo contrast 02/16/2020  IMPRESSION: 1. Continued interval decrease in conspicuity of scattered small volume subarachnoid hemorrhage involving both frontoparietal convexities, now only faintly visible and nearly resolved. 2. Continued interval evolution of bilateral watershed territory infarcts, most pronounced at the left frontoparietal convexity. Overall size and distribution is relatively similar. No hemorrhagic transformation by CT. 3. Persistent but improved hyperdensity about the veins of [place] since prior CT. Likewise, the remainder of the dural sinuses also appear less dense and engorged as compared to previous. 4. No other new acute intracranial abnormality.  CT Head wo contrast 02/14/2020  IMPRESSION: Small volume bilateral frontoparietal and basilar cistern subarachnoid hemorrhage is unchanged. No new intracranial hemorrhage.  Small bilateral cerebral infarcts are not well demonstrated on this exam.  Increased conspicuity of left cerebral edema.  Hyperdense appearance of the cortical veins, superior sagittal and right transverse sinus is less conspicuous than prior exam.  IR Thrombectomy 02/13/2020  Findings. 1.Extensive thrombus load in the TS bilaterally,the Rt SS ,RT IJV and post one third of the SSS. Large  chunks of clot removed with revascularization of the TS bilaterally and partially the RT SS and the torcula. Sig decreased venous congestion of the cortical veins noted post procedure. Post CT brain No ICH or mass effect or hydrocephalus. Clinically stable with pupils 67mm sluggish.  CT Head wo contrast 02/12/2020  IMPRESSION: 1. The study is positive for a small volume of subarachnoid hemorrhage. There is no midline shift. 2. Hyperdense dural sinuses and cortical veins especially on the right in addition to a filling defect within the transverse sinus on the right is concerning for dural venous thrombosis and may be the source of the patient's subarachnoid hemorrhage. Emergent MRI/MRV is recommended for further evaluation.  MR Brain wo contrast MR Venogram Head 02/12/2020 IMPRESSION: 1. Extensive, acute dural venous sinus thrombosis involving the superior sagittal sinus into the right internal jugular vein via the dominant right transverse and sigmoid system. Both veins of Trolard are thrombosed and there is straight sinus/deep cerebral thrombosis as well. 2. Deep watershed white matter infarcts, small volume subarachnoid hemorrhage, and left more than right frontoparietal cortical swelling due to #1.  MR Brain wo contrast MR Venogram Head 02/15/2020  Brain MRI IMPRESSION: 1. Progressive left perirolandic venous infarction and petechial hemorrhage. 2. New edema in the deep gray nuclei without infarction.  Intracranial MRV: IMPRESSION: 1. Improved flow especially in the deep venous system. Improved flow also seen in the proximal right transverse sinus. 2. Occlusive clot still seen in the superior sagittal, distal right transverse, and right sigmoid dural sinuses. No recanalization of the veins of Trolard.  EEG adult 02/12/2020  ABNORMALITY -Continue slow, generalized  IMPRESSION: This study is suggestive of moderate diffuse encephalopathy, nonspecific etiology.  At  1531, patient was noted to have right upper extremity jerking.  Concomitant EEG showed high amplitude 2 to 3 Hz rhythmic delta slowing in bitemporal region with evolution in morphology and amplitude but without definite evolution in frequency concerning for focal motor seizure.   EEG adult  02/16/2020  IMPRESSION: This study is suggestive of moderate to severe diffuse encephalopathy, nonspecific etiology.  No seizures or epileptiform discharges were seen throughout the recording.    PHYSICAL EXAM GENERAL: Stuporous. Intubated, laying in bed, in no acute distress Head: Normocephalic and atraumatic. EENT: No OP obstruction, normal conjunctiva.  LUNGS - Normal respiratory effort on room air CV - Tachycardic to the 130's on cardiac  monitor, 2+ pedal pulses ABDOMEN - Soft, non-distended Ext: warm, well perfused Neurological:  Mental Status: Patient is intubated and sedated Stuporous opens eyes partially temporarily only to sternal rub, does not respond to verbal stimuli, does not follow commands.  Opens eyes partially but cannot sustain attention Cranial Nerves: Right eye slightly hypertrophic left eye position normal, doll's eye reflex +.  Both pupils small sluggishly reactive.  Corneal reflexes are present bilaterally face is symmetric.  Tongue midline.  Fundi not visualized Motor: Brisk semipurposeful movements in the left upper and lower extremity to sternal rub and trace movements on the right. Sensory: Unable to access. Deep Tendon Reflexes:  Right: Upper Extremity   Left: Upper extremity   biceps (C-5 to C-6) 2/4   biceps (C-5 to C-6) 2/4 tricep (C7) 2/4    triceps (C7) 2/4 Brachioradialis (C6) 2/4  Brachioradialis (C6) 2/4  Lower Extremity Lower Extremity  quadriceps (L-2 to L-4) 2/4   quadriceps (L-2 to L-4) 2/4 Achilles (S1) 2/4   Achilles (S1) 2/4 Gait: Deferred   ASSESSMENT/PLAN Ms. Jill Clarke is a 36 y.o. female with PMHx of asthma and sickle cell trait  presented with altered mental status.  CT head revealed a small volume subarachnoid hemorrhage and hyperdense dural sinuses with filling defect concerning for dural venous thrombosis.  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  Extremely delicate situation, that is why, IR is consulted for thrombectomy and patient is with IR now.  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    MRI  and MRV 02/12/20 A). Extensive, acute dural venous sinus thrombosis involving the superior sagittal sinus into the right internal jugular vein via the dominant right transverse and sigmoid system. B).Both veins of [place] are thrombosed and there is straight sinus/deep cerebral thrombosis as well. C). Deep watershed white matter infarcts, small volume subarachnoid hemorrhage, and left more than right frontoparietal cortical swelling due to #1.  IR Thrombectomy: 02/13/2020- Large chunks of clot removed with revascularization of the TS bilaterally and partially the RT SS and the torcula.Sig decreased venous congestion of the cortical veins noted post procedure.  MRI and MRV 02/15/20 - Improved flow especially in the deep venous system. Improved flow also seen in the proximal right transverse sinus. Occlusive clot still seen in the superior sagittal, distal right transverse, and right sigmoid dural sinuses. No recanalization of the veins of [place]ard. Progressive left perirolandic venous infarction and petechial hemorrhage. New edema in the deep gray nuclei without infarction.  IR Thrombectomy: 02/15/2020 - complete revascularization of  SSS,Straight S,V of [place] and ICVs ,and RT TS. Sig focal stenosis at the RT TS/SS junction angioplastied with balloon with  improved flow into RT SS.  2D Echo - pending  LDL 108  HgbA1c 4.9   Hypercoagulable panel: mildly decreased protein S, but may be an acute phase reactant and should be rechecked as an outpatient in 6 to 8 weeks.  Repeat COVID testing: negative on 02/14/20 and on 02/11/20  VTE prophylaxis - IV heparin  No antithrombotic prior to admission, now on IV heparin for dural venous thrombosis    Therapy recommendations: pending  Disposition: pending  Cerebral edema   On 3% saline  MRI 1/23 new edema in the deep gray nuclei without infarction  Na goal 150-155  Na monitoring  Na 149->150->153  Focal motor seizure  EEG 02/12/20 EEG showed high amplitude 2 to 3 Hz rhythmic delta slowing in bitemporal region with evolution in morphology and amplitude but without definite evolution in frequency concerning for focal motor seizure.  On keppra 1000 bid  Repeat EEG 02/16/20 - moderate to severe diffuse encephalopathy  Continue keppra  Respiratory failure  Intubated on sedation  Not candidate for extubation today  CCM on board  Extubate as able  Hypotension  BP on the low end  Phenylephrine started 02/15/20 . On IVF and TF . BP goal 120-160 . Long-term BP goal normotensive  Anemia - acute blood loss  Hgb - 11.0->8.2->8.6->7.2->7.0  May related to repeated procedure and IV heparin  PRBC transfusion   CBC monitoring  Hyperlipidemia  Home meds: None  LDL 108, goal < 70  Will consider statin when stable  Continue statin at discharge   Other Stroke Risk Factors Sickle Cell Trait: No associated risk found due to sickle cell trait.    Other Active Problems  Asthma   Leukocytosis - WBC's - 13.3->15.9 (afebrile) >15.2  Hypokalemia - potassium - 2.9->3.0->3.6  Hypophosphatemia - Phosphorus - 2.0->1.3->1.9  Mastoiditis on MRI.  Unsure  if this is contributing to venous sinus thrombosis but will start antibiotics  Hospital day # 4   02/16/2020 1:18 PM  ATTENDING NOTE: I reviewed above note and agree with the assessment and plan. Pt was seen and examined.   35 year old female with history of asthma, sickle cell trait admitted for altered mental status.  CT showed bilateral cortical ch, peripontine sister [facility] and concerning for CSVT.  MRI showed watershed infarcts but MRV demonstrated extensive CSVT.  Status post IR thrombectomy 1/21 and 1/23.  Repeat CT showed improving SAH, and see no of dural sinuses.  Currently on IV heparin.  Had a focal motor seizure on 1/20, currently on Keppra, repeat EEG negative.  2D echo pending.  Patient currently still intubated on sedation, plan to extubate as able.  On exam General - Well nourished, well developed, intubated on sedation.  Ophthalmologic - fundi not visualized due to noncooperation.  Cardiovascular - Regular rhythm but tachycardia.  Neuro - intubated on sedation, eyes closed and barely open on repetitive stimulation, following commands. With forced eye opening, eyes in slight upper gaze position bilaterally, conjugate, blinking to visual threat, doll's eyes present, not tracking, PERRL. Corneal reflex present, gag and cough present. Breathing over the vent.  Facial symmetry not able to test due to ET tube.  Tongue protrusion not cooperative. On pain stimulation, withdraw in left upper and lower extremities against gravity but slight withdraw no the right UE and mild withdraw right LE. DTR 1+ and no babinski. Sensation, coordination and gait not tested.   Continue 3% saline for cerebral edema control.  Sodium goal 150-155.  Continue tube feeding at this time.  Hemoglobin drop to 7.0 this afternoon, recommend 1 unit PRBC transfusion.  CBC monitoring.  Still on neo for hypotension but improving.  Wean off neo as able, BP goal 120-160.  Plan to repeat MRI MRV on Thursday.  Etiology  for patient CSVT still unclear, DDx including significant focal stenosis at the RT TS/SS junction, dehydration, hypercoagulable state, sickle cell trait, or recent Covid infection.  However, the intensity and severity of CSVT seems not able  to be explained by simple dehydration or sickle cell trait.  Patient Covid test twice negative and only mild decrease of protein S activity also hard to explain patient current condition.  Currently, significant focal stenosis of the right TS/SS junction may be underlying cause. Will need continued follow up after acute phase.  Rosalin Hawking, MD PhD Stroke Neurology 02/16/2020 4:55 PM  This patient is critically ill due to extensive CSVT, SAH, venous infarct, respiratory failure, focal seizure, cerebral edema and hypotension and at significant risk of neurological worsening, death form brain herniation, progressive venous infarct, status epilepticus, severe anemia, hypovolemic shock. This patient's care requires constant monitoring of vital signs, hemodynamics, respiratory and cardiac monitoring, review of multiple databases, neurological assessment, discussion with family, other specialists and medical decision making of high complexity. I spent 40 minutes of neurocritical care time in the care of this patient. I had long discussion with mom at bedside, updated pt current condition, treatment plan and potential prognosis, and answered all the questions.  She expressed understanding and appreciation.     To contact Stroke Continuity provider, please refer to [email or web address] After hours, contact General Neurology

## 2020-02-16 NOTE — Procedures (Signed)
Patient Name: Jill Clarke  MRN: [id number]  Epilepsy Attending: Lora Havens  Referring Physician/Provider: Dr Rosalin Hawking Date: 02/16/2020 Duration: 24.48 mins  Patient history: 35 year old female with altered mental status.  EEG to evaluate for seizures.  Level of alertness: Comatose  AEDs during EEG study: LEV  Technical aspects: This EEG study was done with scalp electrodes positioned according to the 10-20 International system of electrode placement. Electrical activity was acquired at a sampling rate of 500Hz  and reviewed with a high frequency filter of 70Hz  and a low frequency filter of 1Hz . EEG data were recorded continuously and digitally stored.   Description: EEG showed continuous polymorphic 3 to 6 Hz theta-delta slowing. Hperventilation and photic stimulation were not performed.     ABNORMALITY -Continuous slow, generalized  IMPRESSION: This study is suggestive of moderate to severe diffuse encephalopathy, nonspecific etiology.  No seizures or epileptiform discharges were seen throughout the recording.  Mei Suits Barbra Sarks

## 2020-02-16 NOTE — Progress Notes (Signed)
During sedation wean became agitated, tachycardic, desaturated. Resedated and placed back on full support. Will try precedex to facilitate wean.  Erskine Emery MD PCCM

## 2020-02-16 NOTE — Progress Notes (Signed)
EEG complete - results pending 

## 2020-02-16 NOTE — Progress Notes (Signed)
Initial Nutrition Assessment  DOCUMENTATION CODES:   Not applicable  INTERVENTION:   Initiate tube feeding via OG tube: Osmolite 1.5 at 50 ml/h (1200 ml per day) Prosource TF 45 ml BID  Provides 1880 kcal, 97 gm protein, 912 ml free water daily   NUTRITION DIAGNOSIS:   Inadequate oral intake related to inability to eat as evidenced by NPO status.  GOAL:   Patient will meet greater than or equal to 90% of their needs  MONITOR:   TF tolerance  REASON FOR ASSESSMENT:   Consult,Ventilator Enteral/tube feeding initiation and management  ASSESSMENT:   Pt with PMH of asthma, DM, and sickle cell trait admitted with acute CVA and SAH due to extensive dural venous sinus thrombosis s/p emergent thrombectomy 1/21 and second thrombectomy 1/23.   Pt discussed during ICU rounds and with RN.  Pt with R hemiparesis  Patient is currently intubated on ventilator support MV: 9.8 L/min Temp (24hrs), Avg:98.1 F (36.7 C), Min:97.7 F (36.5 C), Max:98.7 F (37.1 C)  Propofol: 3 ml/hr provides: 79 kcal  Medications reviewed and include: colace, SSI, miralax  Precedex Fentanyl  Phenylephrine (concentrated) @ 27 mcg  Hypertonic saline  Labs reviewed: Na 153    OG tube: tip gastric  Current TF: Vital High Protein @ 40 ml/hr with 45 ml ProSource TF BID Provides: 1040 kcal and 106 grams protein  Diet Order:   Diet Order            Diet NPO time specified  Diet effective now                 EDUCATION NEEDS:   No education needs have been identified at this time  Skin:  Skin Assessment: Reviewed RN Assessment  Last BM:  none documented  Height:   Ht Readings from Last 1 Encounters:  02/13/20 5\' 5"  (1.651 m)    Weight:   Wt Readings from Last 1 Encounters:  02/16/20 69.8 kg    Ideal Body Weight:  9.8 kg  BMI:  Body mass index is 25.61 kg/m.  Estimated Nutritional Needs:   Kcal:  1800  Protein:  85-105 grams  Fluid:  >1.8 L/day   Lockie Pares., RD,  LDN, CNSC See AMiON for contact information

## 2020-02-16 NOTE — Progress Notes (Addendum)
After patient was transported back from CT, this RN noticed that the concentrated neo synephrine was Y-d in with the heparin which was running through the patient's PIV located in the [facility]. The line was changed immediately and placed to infuse into the patient's PICC again. The [facility] PIV was not infiltrated and had blood return upon assessment. Pharmacy was notified and they suggested taking out the IV that the concentrated Neo was running through. AC PIV was removed and another PIV was placed for the heparin gtt to continue. Neuro MD made aware of the medication administration route discrepancy, no new orders at this time. Will continue to monitor.

## 2020-02-16 NOTE — Progress Notes (Signed)
Pt transported to and from CT w/o event 

## 2020-02-16 NOTE — Progress Notes (Signed)
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 PCCM consulted for possible post-op intubation/ventilation management.  History of Present Illness:  35 yoF initially presented to the ED 02/10/20 due to one week of headache, N/V and diarrhea. She was discharged after successful PO challenge. She returned to her mother's house when her mother states she was not behaving normally, rubbing the comforter excessively, turing the light switch on and off repetitively and not verbally response so she returned to the ED 1/19. At that time a CT head revealed small volume subarachnoid hemorrhage and hyperdense dural sinuses with a filling defect concerning for dural venous thrombosis. 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 Neurology was consulted for further management.  Past Medical History:  Asthma Sickle cell trait DM  Significant Hospital Events:  Admitted 1/20 1/21 angiogram w/ clot extraction/ angioplasty 1/23 angiogram w/ angioplasty of venous thrombus  Consults:  PCCM IR  Procedures:  EEG 1/20- This study is suggestive of moderate diffuse encephalopathy, nonspecific etiology.  At 1531, patient was noted to have right upper extremity jerking.  Concomitant EEG showed high amplitude 2 to 3 Hz rhythmic delta slowing in bitemporal region with evolution in morphology and amplitude but without definite evolution in frequency concerning for focal motor seizure.  Significant Diagnostic Tests:   CT-scan of the brain IMPRESSION: 1. The study is positive for a small volume of subarachnoid hemorrhage. There is no midline shift. 2. Hyperdense dural sinuses and  cortical veins especially on the right in addition to a filling defect within the transverse sinus on the right is concerning for dural venous thrombosis and may be the source of the patient's subarachnoid hemorrhage. Emergent MRI/MRV is recommended for further evaluation.  MRI/MR venogram examination of the brain IMPRESSION: 1. Extensive, acute dural venous sinus thrombosis involving the superior sagittal sinus into the right internal jugular vein via the dominant right transverse and sigmoid system. Both veins of [place] are thrombosed and there is straight sinus/deep cerebral thrombosis as well. 2. Deep watershed white matter infarcts, small volume subarachnoid hemorrhage, and left more than right frontoparietal cortical swelling due to #1.  Follow-up CT 1/22: cereberal edema same or slightly worse, clot burden has improved (personal review)   Interim History / Subjective:  No events, remains intubated/sedated.  Objective   Blood pressure 133/80, pulse 70, temperature 97.8 F (36.6 C), temperature source Axillary, resp. rate 18, height 5\' 5"  (1.651 m), weight 69.8 kg, last menstrual period 02/03/2020, SpO2 100 %.    Vent Mode: PRVC FiO2 (%):  [30 %] 30 % Set Rate:  [16 bmp] 16 bmp Vt Set:  [450 mL] 450 mL PEEP:  [5 cmH20] 5 cmH20 Pressure Support:  [12 cmH20] 12 cmH20 Plateau Pressure:  [14 cmH20-19 cmH20] 19 cmH20   Intake/Output Summary (Last 24 hours) at 02/16/2020 0706 Last data filed at 02/16/2020 0700 Gross per 24 hour  Intake 6440.61 ml  Output 6525 ml  Net -84.39 ml   Filed Weights   02/13/20 1101 02/16/20 0500  Weight: 68.4 kg 69.8 kg    Examination: Constitutional: intubated ill appearing woman in NAD  Eyes: pupils equal, not tracking Ears, nose, mouth, and throat: ETT in  place, minimal secretions Cardiovascular: RRR, ext warm Respiratory: Clear, no accessory muscle use Gastrointestinal: soft, hypoactive BS Skin: No rashes, normal turgor Neurologic: limited  movement on L, withdraws on R Psychiatric: RASS -4 Sheath sites stable.  Sodium 150 Cr stable Hgb low/stable   Resolved Hospital Problem list     Assessment & Plan:   34yoF presented with AMS after one week of headache, n/v and diarrhea. CT head and MRI brain demonstrated small volume SAH and extensive dural venous sinus thrombosis.   Extensive dural venous thrombosis  Cerebral edema requiring titration of hypertonic saline Thought to be due to dehydration from GI illness and subsequently leading to congestion and causing SAH. Patient does have a history of sickle cell trait; hypercoagulable panel pending.   Postprocedural respiratory failure- on mechanical ventilation  Plan:  -RN to discuss with IR to see if any further plans/imaging needed; if not work toward SAT/SBT - Hypertonic saline titrated to Na 150-155 - Close neuro checks - Usual VAP prevention bundle - Cautious heparin gtt   Best practice (evaluated daily)  Diet: npo Pain/Anxiety/Delirium protocol (if indicated): n/a VAP protocol (if indicated): n/a DVT prophylaxis: heparin GI prophylaxis: protonix Glucose control: SSI Mobility: bedrest Disposition:pending clinical improvement  Goals of Care:  Per primary   Patient critically ill due to respiratory failure Interventions to address this today mechanical ventilation titration Risk of deterioration without these interventions is high  I personally spent 31 minutes providing critical care not including any separately billable procedures  Erskine Emery MD [facility] 02/16/2020 7:33 AM Personal pager: [phone] If unanswered, please page CCM On-call: [phone]

## 2020-02-16 NOTE — Progress Notes (Addendum)
OT / PT Cancellation Note  Patient Details Name: JERMAINE THOLL MRN: [id number] DOB: 30-Jan-1985   Cancelled Treatment:    Reason Eval/Treat Not Completed: Patient not medically ready (sedation/ vent- with wake up assessment tachycardic)  Billey Chang, OTR/L  Acute Rehabilitation Services Pager: [phone] Office: [phone] .   Arby Barrette, PT Pager [phone]   02/16/2020, 11:50 AM

## 2020-02-17 LAB — CBC
HCT: 26.2 % — ABNORMAL LOW (ref 36.0–46.0)
Hemoglobin: 8.2 g/dL — ABNORMAL LOW (ref 12.0–15.0)
MCH: 29 pg (ref 26.0–34.0)
MCHC: 31.3 g/dL (ref 30.0–36.0)
MCV: 92.6 fL (ref 80.0–100.0)
Platelets: 467 10*3/uL — ABNORMAL HIGH (ref 150–400)
RBC: 2.83 MIL/uL — ABNORMAL LOW (ref 3.87–5.11)
RDW: 14.7 % (ref 11.5–15.5)
WBC: 13 10*3/uL — ABNORMAL HIGH (ref 4.0–10.5)
nRBC: 0 % (ref 0.0–0.2)

## 2020-02-17 LAB — BASIC METABOLIC PANEL
Anion gap: 9 (ref 5–15)
BUN: 5 mg/dL — ABNORMAL LOW (ref 6–20)
CO2: 25 mmol/L (ref 22–32)
Calcium: 8.7 mg/dL — ABNORMAL LOW (ref 8.9–10.3)
Chloride: 117 mmol/L — ABNORMAL HIGH (ref 98–111)
Creatinine, Ser: 0.39 mg/dL — ABNORMAL LOW (ref 0.44–1.00)
GFR, Estimated: >60 mL/min (ref >60)
Glucose, Bld: 207 mg/dL — ABNORMAL HIGH (ref 70–99)
Potassium: 3.1 mmol/L — ABNORMAL LOW (ref 3.5–5.1)
Sodium: 151 mmol/L — ABNORMAL HIGH (ref 135–145)

## 2020-02-17 LAB — TYPE AND SCREEN
ABO/RH(D): O POS
Antibody Screen: NEGATIVE
Unit division: 0

## 2020-02-17 LAB — GLUCOSE, CAPILLARY
Glucose-Capillary: 108 mg/dL — ABNORMAL HIGH (ref 70–99)
Glucose-Capillary: 113 mg/dL — ABNORMAL HIGH (ref 70–99)
Glucose-Capillary: 116 mg/dL — ABNORMAL HIGH (ref 70–99)
Glucose-Capillary: 120 mg/dL — ABNORMAL HIGH (ref 70–99)
Glucose-Capillary: 126 mg/dL — ABNORMAL HIGH (ref 70–99)
Glucose-Capillary: 185 mg/dL — ABNORMAL HIGH (ref 70–99)

## 2020-02-17 LAB — IRON AND TIBC
Iron: 65 ug/dL (ref 28–170)
Saturation Ratios: 31 % (ref 10.4–31.8)
TIBC: 213 ug/dL — ABNORMAL LOW (ref 250–450)
UIBC: 148 ug/dL

## 2020-02-17 LAB — SODIUM
Sodium: 149 mmol/L — ABNORMAL HIGH (ref 135–145)
Sodium: 149 mmol/L — ABNORMAL HIGH (ref 135–145)
Sodium: 145 mmol/L (ref 135–145)

## 2020-02-17 LAB — HEPARIN LEVEL (UNFRACTIONATED)
Heparin Unfractionated: <0.1 IU/mL — ABNORMAL LOW (ref 0.30–0.70)
Heparin Unfractionated: <0.1 IU/mL — ABNORMAL LOW (ref 0.30–0.70)
Heparin Unfractionated: <0.1 IU/mL — ABNORMAL LOW (ref 0.30–0.70)

## 2020-02-17 LAB — BPAM RBC
Blood Product Expiration Date: 202202242359
ISSUE DATE / TIME: 202201241837
Unit Type and Rh: 5100

## 2020-02-17 LAB — VANCOMYCIN, TROUGH: Vancomycin Tr: 5 ug/mL — ABNORMAL LOW (ref 15–20)

## 2020-02-17 LAB — FERRITIN: Ferritin: 127 ng/mL (ref 11–307)

## 2020-02-17 LAB — MAGNESIUM: Magnesium: 1.8 mg/dL (ref 1.7–2.4)

## 2020-02-17 LAB — TRIGLYCERIDES: Triglycerides: 86 mg/dL (ref <150)

## 2020-02-17 MED ORDER — MAGNESIUM SULFATE 2 GM/50ML IV SOLN
2.0000 g | Freq: Once | INTRAVENOUS | Status: AC
  Administered 2020-02-17: 2 g via INTRAVENOUS
  Filled 2020-02-17: qty 50

## 2020-02-17 MED ORDER — SODIUM CHLORIDE 0.9 % IV SOLN: INTRAVENOUS | Status: DC

## 2020-02-17 MED ORDER — LEVETIRACETAM 100 MG/ML PO SOLN
1000.0000 mg | Freq: Two times a day (BID) | ORAL | Status: DC
  Filled 2020-02-17: qty 10

## 2020-02-17 MED ORDER — AMANTADINE HCL 50 MG/5ML PO SOLN
200.0000 mg | Freq: Once | ORAL | Status: AC
  Administered 2020-02-17: 200 mg via ORAL
  Filled 2020-02-17: qty 20

## 2020-02-17 MED ORDER — POTASSIUM CHLORIDE 10 MEQ/50ML IV SOLN
10.0000 meq | INTRAVENOUS | Status: AC
  Administered 2020-02-17 (×4): 10 meq via INTRAVENOUS
  Filled 2020-02-17 (×4): qty 50

## 2020-02-17 MED ORDER — VANCOMYCIN HCL 1250 MG/250ML IV SOLN
1250.0000 mg | Freq: Three times a day (TID) | INTRAVENOUS | Status: DC
  Administered 2020-02-18: 1250 mg via INTRAVENOUS
  Filled 2020-02-17 (×2): qty 250

## 2020-02-17 MED ORDER — VANCOMYCIN HCL 500 MG/100ML IV SOLN
500.0000 mg | INTRAVENOUS | Status: AC
  Administered 2020-02-17: 500 mg via INTRAVENOUS
  Filled 2020-02-17: qty 100

## 2020-02-17 MED ORDER — PANTOPRAZOLE SODIUM 40 MG PO PACK
40.0000 mg | PACK | Freq: Every day | ORAL | Status: DC
  Administered 2020-02-17: 40 mg

## 2020-02-17 MED ORDER — AMANTADINE HCL 50 MG/5ML PO SOLN
100.0000 mg | Freq: Two times a day (BID) | ORAL | Status: DC
  Filled 2020-02-17 (×6): qty 10

## 2020-02-17 MED ORDER — POTASSIUM CHLORIDE 20 MEQ PO PACK
20.0000 meq | PACK | ORAL | Status: AC
  Administered 2020-02-17 (×2): 20 meq
  Filled 2020-02-17 (×2): qty 1

## 2020-02-17 NOTE — Progress Notes (Signed)
ANTICOAGULATION CONSULT NOTE  Pharmacy Consult:  IV Heparin Indication: Dural venous sinus thrombosis, S/P thrombectomy  No Known Allergies  Patient Measurements: Height: 5\' 5"  (165.1 cm) Weight: 70 kg (154 lb 5.2 oz) IBW/kg (Calculated) : 57 Heparin Dosing Weight: 68 kg  Vital Signs: Temp: 98.5 F (36.9 C) (01/25 1600) Temp Source: Axillary (01/25 1600) BP: 151/83 (01/25 1800) Pulse Rate: 111 (01/25 1900)  Labs: Recent Labs    02/14/20 2326 02/15/20 0149 02/15/20 0513 02/15/20 1032 02/15/20 1240 02/15/20 2300 02/16/20 0419 02/16/20 1525 02/16/20 2231 02/17/20 0400 02/17/20 0500 02/17/20 0900 02/17/20 1821  HGB  --   --   --  8.2*   < >  --  7.2* 7.0* 8.1* 8.2*  --   --   --   HCT  --   --   --  26.6*   < >  --  23.2* 22.7* 25.7* 26.2*  --   --   --   PLT  --   --   --  536*  --   --  480*  --   --  467*  --   --   --   HEPARINUNFRC  --   --    < > 0.15*   < > 0.83* 0.45  --   --   --  <0.10* <0.10* <0.10*  CREATININE  --   --   --  0.42*   < > 0.41* 0.39*  --   --  0.39*  --   --   --   TROPONINIHS 4 5  --   --   --   --   --   --   --   --   --   --   --    < > = values in this interval not displayed.    Estimated Creatinine Clearance: 96.4 mL/min (A) (by C-G formula based on SCr of 0.39 mg/dL (L)).   Assessment: 35 yr old female with AMS, found to have a small volume subarachnoid hemorrhage and extensive dural venous thrombosis.  Pharmacy was consulted to manage IV heparin per stroke protocol. She is S/P thrombectomy in IR on 1/21 and venogram, complete revascularization, angioplasty in IR on 1/23. Heparin was resumed post-procedure.  Heparin level ~6 hrs after heparin infusion was increased to 750 units/hr remains undetectable, despite infusion rate increase. H/H 8.2/26.2, plt 467 (CBC stable). Per RN, no issues with IV (infusing through central line) or bleeding observed.  Goal of Therapy:  Heparin level 0.3-0.5 units/ml Monitor platelets by anticoagulation  protocol: Yes   Plan:  Increase heparin drip to 900 units/hr Check 6-hr heparin level Monitor daily heparin level, CBC, s/sx bleeding  Gillermina Hu, PharmD, BCPS, [facility] Clinical Pharmacist 02/17/2020 7:56 PM

## 2020-02-17 NOTE — Progress Notes (Signed)
Assisted tele visit to patient with family member.  Josel Keo P, RN  

## 2020-02-17 NOTE — Progress Notes (Signed)
Pharmacy Antibiotic Note  Jill Clarke is a 35 y.o. female admitted on 02/12/2020 with dural venous thrombosis.  Pharmacy has been consulted for vancomycin and cefepime dosing for possible mastoiditis.   Renal function stable and vancomycin trough is sub-therapeutic at 5 mcg/mL (goal 15-20 mcg/mL).  Afebrile, WBC 13.  Plan: Vanc 500mg  IV x 1 now for a total of 1250mg  this PM (750mg  IV dose already started) Schedule vanc 1250mg  IV Q8H, start tomorrow Continue cefepime 2 g IV Q8H Monitor renal fxn, clinical progress, repeat vanc trough at [facility]   Height: 5\' 5"  (165.1 cm) Weight: 70 kg (154 lb 5.2 oz) IBW/kg (Calculated) : 57  Temp (24hrs), Avg:98.1 F (36.7 C), Min:97.6 F (36.4 C), Max:98.5 F (36.9 C)  Recent Labs  Lab 02/13/20 0333 02/13/20 2058 02/14/20 1111 02/15/20 1032 02/15/20 1240 02/15/20 2300 02/16/20 0419 02/17/20 0400 02/17/20 1530  WBC 13.3*  --  15.9* 14.0*  --   --  15.2* 13.0*  --   CREATININE  --    < > 0.47 0.42* 0.20* 0.41* 0.39* 0.39*  --   VANCOTROUGH  --   --   --   --   --   --   --   --  5*   < > = values in this interval not displayed.    Estimated Creatinine Clearance: 96.4 mL/min (A) (by C-G formula based on SCr of 0.39 mg/dL (L)).    No Known Allergies  1/23 vanc >> 1/23 cefepime >>  1/25 VT = 5 mcg/mL on 750mg  q8 >> 1250mg  q8  Jill Clarke, PharmD, BCPS, [place] 02/17/2020, 4:47 PM

## 2020-02-17 NOTE — Evaluation (Signed)
Occupational Therapy Evaluation Patient Details Name: Jill Clarke MRN: [id number] DOB: [birth year] Today's Date: 02/17/2020    History of Present Illness 35 y.o. female with a history significant for asthma and sickle cell trait presented to ED 1/19 for N/V and discharged home. Returned 1/20 due to AMS. CT Head revealing a small volume subarachnoid hemorrhage and hyperdense dural sinuses with a filling defect concerning for dural venous thrombosis. 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   Clinical Impression   PT admitted with revascularized CVA with watershed to white matter. Pt currently with functional limitiations due to the deficits listed below (see OT problem list). Pt following simple commands with L UE / LE this session. Pt requires increased HOB for arousal and attention. Pt with focused attention with delayed response of 10-15 seconds. Pt with upward gaze with a downward drift. Pt only moving R UE/ LE with painful stimuli, in this session was neck rotation toward R side.  Pt will benefit from skilled OT to increase their independence and safety with adls and balance to allow discharge Ltach due to vent dependence. Pt tolerated session on PS/ CPAP vent.     Follow Up Recommendations  LTACH    Equipment Recommendations  Wheelchair (measurements OT);Wheelchair cushion (measurements OT);Hospital bed;Other (comment) (hoyer)    Recommendations for Other Services Other (comment) (palliative)     Precautions / Restrictions Precautions Precautions: Other (comment) Precaution Comments: on vent; multiple lines      Mobility Bed Mobility               General bed mobility comments: total assist supine to chair position; head leans to left and repositioned to neutral with pillows and towels rolls    Transfers                 General transfer comment: n/a defer to future session    Balance                                            ADL either performed or assessed with clinical judgement   ADL Overall ADL's : Needs assistance/impaired                                       General ADL Comments: total (A) for all adls- dependent at this time on vent     Vision   Additional Comments: pt noted to have upward gaze with downward drift and then upward beat . pt with L gaze preference ntoed. pt does close eyes and sustain eye lids to command. pt does not track at this time.     Perception     Praxis      Pertinent Vitals/Pain Pain Assessment:  (withdrew to pain LUE; RUE jumped with pain wiht cervical ROM)     Hand Dominance  (uncertain)   Extremity/Trunk Assessment Upper Extremity Assessment Upper Extremity Assessment: RUE deficits/detail;LUE deficits/detail RUE Deficits / Details: does not move arm to commands but does with painful response hand and elbow LUE Deficits / Details: moving 4th and 5th digits on command. pt holding against gravity in painful stimuli of neck rotation   Lower Extremity Assessment Lower Extremity Assessment: Defer to PT evaluation RLE Deficits / Details: no tone noted; PROM  WNL LLE Deficits / Details: actively moved toes on command; PROM WFL   Cervical / Trunk Assessment Cervical / Trunk Assessment: Other exceptions Cervical / Trunk Exceptions: Keeps head rotated to her left (?neglect)   Communication Communication Communication: Other (comment) (no attempts; intubated)   Cognition Arousal/Alertness: Lethargic;Suspect due to medications (off dirprivan and on very little precedex) Behavior During Therapy: Flat affect Overall Cognitive Status: Impaired/Different from baseline                                 General Comments: Pt able to follow simple commnads (close eyes, wiggle toes, wiggle fingers (only on left)   General Comments  PS PEEP 5, 30% RR 18-26; HR 86-112; BP 154/87 158/85     Exercises Exercises: Other exercises Other Exercises Other Exercises: PROM of R shoulder elbow wrist digits x 10 Other Exercises: PROM of L shoulder elbow wrist digits x 10   Shoulder Instructions      Home Living Family/patient expects to be discharged to:: Unsure                                 Additional Comments: per chart was "in between places" and staying with friends      Prior Functioning/Environment Level of Independence: Independent        Comments: assumed        OT Problem List: Decreased strength;Decreased range of motion;Decreased activity tolerance;Impaired balance (sitting and/or standing);Impaired vision/perception;Decreased coordination;Decreased cognition;Decreased safety awareness;Decreased knowledge of use of DME or AE;Decreased knowledge of precautions;Cardiopulmonary status limiting activity;Impaired sensation;Obesity;Impaired UE functional use      OT Treatment/Interventions: Self-care/ADL training;Therapeutic exercise;Neuromuscular education;Energy conservation;DME and/or AE instruction;Manual therapy;Modalities;Splinting;Therapeutic activities;Cognitive remediation/compensation;Visual/perceptual remediation/compensation;Patient/family education;Balance training    OT Goals(Current goals can be found in the care plan section) Acute Rehab OT Goals Patient Stated Goal: unable OT Goal Formulation: Patient unable to participate in goal setting Potential to Achieve Goals: Fair  OT Frequency: Min 2X/week   Barriers to D/C:            Co-evaluation PT/OT/SLP Co-Evaluation/Treatment: Yes Reason for Co-Treatment: Complexity of the patient's impairments (multi-system involvement);Necessary to address cognition/behavior during functional activity;For patient/therapist safety;To address functional/ADL transfers PT goals addressed during session: Mobility/safety with mobility;Balance OT goals addressed during session: ADL's and self-care;Proper  use of Adaptive equipment and DME;Strengthening/ROM      AM-PAC OT "6 Clicks" Daily Activity     Outcome Measure Help from another person eating meals?: Total Help from another person taking care of personal grooming?: Total Help from another person toileting, which includes using toliet, bedpan, or urinal?: Total Help from another person bathing (including washing, rinsing, drying)?: Total Help from another person to put on and taking off regular upper body clothing?: Total Help from another person to put on and taking off regular lower body clothing?: Total 6 Click Score: 6   End of Session Equipment Utilized During Treatment: Oxygen Nurse Communication: Mobility status;Precautions  Activity Tolerance: Treatment limited secondary to medical complications (Comment) Patient left: in bed;with call bell/phone within reach;with bed alarm set;with restraints reapplied;with SCD's reapplied (mittens)  OT Visit Diagnosis: Unsteadiness on feet (R26.81);Muscle weakness (generalized) (M62.81)                Time: 6606-3016 OT Time Calculation (min): 32 min Charges:  OT General Charges $OT Visit: 1 Visit OT Evaluation $OT Eval High Complexity:  1 High   Brynn, OTR/L  Acute Rehabilitation Services Pager: [phone] Office: [phone] .   Jeri Modena 02/17/2020, 4:07 PM

## 2020-02-17 NOTE — Progress Notes (Signed)
SLP Cancellation Note  Patient Details Name: Jill Clarke MRN: [id number] DOB: Oct 30, 1985   Cancelled treatment:        Pt is on vent weaning on PSV, so progressing. Will follow. Currently have orders for speech-cognition only. Please order BSE if/when warranted.    Houston Siren 02/17/2020, 9:27 AM   Orbie Pyo Colvin Caroli.Ed Risk analyst [phone] Office [phone]

## 2020-02-17 NOTE — Procedures (Signed)
Extubation Procedure Note  Patient Details:   Name: Jill Clarke DOB: 08-08-1985 MRN: [id number]   Airway Documentation:    Vent end date: (not recorded) Vent end time: (not recorded)   Evaluation  O2 sats: stable throughout Complications: No apparent complications Patient did tolerate procedure well. Bilateral Breath Sounds: Rhonchi,Diminished   Yes, patient able to speak.  Patient extubated at 1540 per MD order. Placed on 4L nasal cannula. RN at bedside. Patient tolerated well. Vitals stable.  Seward Speck 02/17/2020, 3:43 PM

## 2020-02-17 NOTE — Progress Notes (Signed)
K 3.1, Mg 1.8 Electrolytes replaced per protocol

## 2020-02-17 NOTE — Progress Notes (Signed)
ANTICOAGULATION CONSULT NOTE  Pharmacy Consult:  Heparin Indication: Dural venous sinus thrombosis s/p thrombectomy  No Known Allergies  Patient Measurements: Height: 5\' 5"  (165.1 cm) Weight: 70 kg (154 lb 5.2 oz) IBW/kg (Calculated) : 57 Heparin Dosing Weight: 68 kg  Vital Signs: Temp: 97.6 F (36.4 C) (01/25 0800) Temp Source: Axillary (01/25 0800) BP: 126/76 (01/25 1100) Pulse Rate: 85 (01/25 1100)  Labs: Recent Labs    02/14/20 2326 02/15/20 0149 02/15/20 0513 02/15/20 1032 02/15/20 1240 02/15/20 2300 02/16/20 0419 02/16/20 1525 02/16/20 2231 02/17/20 0400 02/17/20 0500 02/17/20 0900  HGB  --   --   --  8.2*   < >  --  7.2* 7.0* 8.1* 8.2*  --   --   HCT  --   --   --  26.6*   < >  --  23.2* 22.7* 25.7* 26.2*  --   --   PLT  --   --   --  536*  --   --  480*  --   --  467*  --   --   HEPARINUNFRC  --   --    < > 0.15*   < > 0.83* 0.45  --   --   --  <0.10* <0.10*  CREATININE  --   --   --  0.42*   < > 0.41* 0.39*  --   --  0.39*  --   --   TROPONINIHS 4 5  --   --   --   --   --   --   --   --   --   --    < > = values in this interval not displayed.    Estimated Creatinine Clearance: 96.4 mL/min (A) (by C-G formula based on SCr of 0.39 mg/dL (L)).   Assessment: 29 YOF with AMS, found to have a small volume subarachnoid hemorrhage and extensive dural venous thrombosis.  Pharmacy consulted to manage IV heparin per stroke protocol. She is s/p thrombectomy in IR on 1/21 and venogram, complete revascularization, angioplasty in IR on 1/23. Heparin resumed post-procedure.  Heparin level resulted as undetectable this AM and was repeated to confirm - remains undetectable. Hg stable at 8.2 today (s/p transfusion 1/24), plt elevated. No issues with infusion or bleeding per RN.  Goal of Therapy:  Heparin level 0.3-0.5 units/ml Monitor platelets by anticoagulation protocol: Yes   Plan:  Increase heparin drip to 750 units/hr F/u 6hr heparin level Monitor daily CBC,  s/sx bleeding   Arturo Morton, PharmD, BCPS Please check AMION for all [facility] contact numbers Clinical Pharmacist 02/17/2020 12:15 PM

## 2020-02-17 NOTE — Progress Notes (Addendum)
STROKE TEAM PROGRESS NOTE  INTERVAL HISTORY No acute overnight events. Patient evaluated at bedside this morning, no family present in the room.  Patient is less responsive today, remains intubated and on fentanyl as needed, off propofol today.  Difficult to arouse, minimally responds to noxious stimuli, obtunded, not speaking or following any commands.  As imaging studies looks better than clinical presentation, doing amantadine loading today with amantadine 100 twice daily starting tomorrow.  Remains purposeful with movement in left arm and left leg but has dense right hemiparesis.  Patient is on IV heparin and aggressive IV fluids.  Planning to repeat MR brain and MRV with contrast tomorrow morning.  Sodium level is within normal goal.  Hypokalemia today of 3.1, receiving 4 rounds of IV potassium repletion.  Trying to wean off Precedex today, blood pressure well controlled.  Received 1 unit of PRBC yesterday and hemoglobin increased to 8.2 this morning.  Neurological exam essentially unchanged.  Vitals:   02/17/20 0740 02/17/20 0759 02/17/20 0800 02/17/20 0900  BP:   138/88 134/77  Pulse: 77 87 (!) 102 79  Resp: 17 (!) 38 (!) 22 11  Temp:   97.6 F (36.4 C)   TempSrc:   Axillary   SpO2: 100% 100% 100% 100%  Weight:      Height:       CBC:  Recent Labs  Lab 02/14/20 1111 02/15/20 1032 02/16/20 0419 02/16/20 1525 02/16/20 2231 02/17/20 0400  WBC 15.9*   < > 15.2*  --   --  13.0*  NEUTROABS 9.8*  --  10.6*  --   --   --   HGB 8.6*   < > 7.2*   < > 8.1* 8.2*  HCT 27.7*   < > 23.2*   < > 25.7* 26.2*  MCV 93.9   < > 93.5  --   --  92.6  PLT 575*   < > 480*  --   --  467*   < > = values in this interval not displayed.   Basic Metabolic Panel:  Recent Labs  Lab 02/15/20 0513 02/15/20 1032 02/15/20 1612 02/15/20 2300 02/16/20 0419 02/16/20 1006 02/16/20 2231 02/17/20 0400  NA 156*   < > 149*   < > 150*   < > 153* 151*  K  --    < >  --    < > 3.6  --   --  3.1*  CL  --    < >   --    < > 118*  --   --  117*  CO2  --    < >  --    < > 27  --   --  25  GLUCOSE  --    < >  --    < > 131*  --   --  207*  BUN  --    < >  --    < > 6  --   --  5*  CREATININE  --    < >  --    < > 0.39*  --   --  0.39*  CALCIUM  --    < >  --    < > 8.5*  --   --  8.7*  MG 1.7  --  1.6*  --   --   --   --  1.8  PHOS 1.9*  --  2.7  --   --   --   --   --    < > =  values in this interval not displayed.   Lipid Panel:  Recent Labs  Lab 02/13/20 0333 02/14/20 0505 02/17/20 0500  CHOL 148  --   --   TRIG 60   < > 86  HDL 28*  --   --   CHOLHDL 5.3  --   --   VLDL 12  --   --   LDLCALC 108*  --   --    < > = values in this interval not displayed.   HgbA1c:  Recent Labs  Lab 02/13/20 0333  HGBA1C 4.9   Urine Drug Screen:  Recent Labs  Lab 02/12/20 0701  LABOPIA NONE DETECTED  COCAINSCRNUR NONE DETECTED  LABBENZ NONE DETECTED  AMPHETMU NONE DETECTED  THCU NONE DETECTED  LABBARB NONE DETECTED    Alcohol Level  Recent Labs  Lab 02/12/20 0417  ETH <10    IMAGING past 24 hours  Echocardiogram 02/16/2020  IMPRESSIONS   1. Left ventricular ejection fraction, by estimation, is 55 to 60%. The  left ventricle has normal function. The left ventricle has no regional  wall motion abnormalities. Left ventricular diastolic parameters were  normal.  2. Right ventricular systolic function is normal. The right ventricular  size is normal. There is normal pulmonary artery systolic pressure.  3. Mild mitral valve leaflet thickening and calcification. Trivial mitral  valve regurgitation.  4. The aortic valve is tricuspid. There is mild thickening of the aortic  valve. Aortic valve regurgitation is not visualized. No aortic stenosis is  present.  5. The inferior vena cava is normal in size with <50% respiratory  variability, suggesting right atrial pressure of 8 mmHg.   CT Head wo contrast 02/16/2020  IMPRESSION: 1. Continued interval decrease in conspicuity of  scattered small volume subarachnoid hemorrhage involving both frontoparietal convexities, now only faintly visible and nearly resolved. 2. Continued interval evolution of bilateral watershed territory infarcts, most pronounced at the left frontoparietal convexity. Overall size and distribution is relatively similar. No hemorrhagic transformation by CT. 3. Persistent but improved hyperdensity about the veins of [place] since prior CT. Likewise, the remainder of the dural sinuses also appear less dense and engorged as compared to previous. 4. No other new acute intracranial abnormality.  CT Head wo contrast 02/14/2020  IMPRESSION: Small volume bilateral frontoparietal and basilar cistern subarachnoid hemorrhage is unchanged. No new intracranial hemorrhage.  Small bilateral cerebral infarcts are not well demonstrated on this exam.  Increased conspicuity of left cerebral edema.  Hyperdense appearance of the cortical veins, superior sagittal and right transverse sinus is less conspicuous than prior exam.  IR Thrombectomy 02/13/2020  Findings. 1.Extensive thrombus load in the TS bilaterally,the Rt SS ,RT IJV and post one third of the SSS. Large chunks of clot removed with revascularization of the TS bilaterally and partially the RT SS and the torcula. Sig decreased venous congestion of the cortical veins noted post procedure. Post CT brain No ICH or mass effect or hydrocephalus. Clinically stable with pupils 96mm sluggish.  CT Head wo contrast 02/12/2020  IMPRESSION: 1. The study is positive for a small volume of subarachnoid hemorrhage. There is no midline shift. 2. Hyperdense dural sinuses and cortical veins especially on the right in addition to a filling defect within the transverse sinus on the right is concerning for dural venous thrombosis and may be the source of the patient's subarachnoid hemorrhage. Emergent MRI/MRV is recommended for further evaluation.  MR  Brain wo contrast MR Venogram Head 02/12/2020 IMPRESSION: 1. Extensive, acute dural  venous sinus thrombosis involving the superior sagittal sinus into the right internal jugular vein via the dominant right transverse and sigmoid system. Both veins of [place] are thrombosed and there is straight sinus/deep cerebral thrombosis as well. 2. Deep watershed white matter infarcts, small volume subarachnoid hemorrhage, and left more than right frontoparietal cortical swelling due to #1.  MR Brain wo contrast MR Venogram Head 02/15/2020  Brain MRI IMPRESSION: 1. Progressive left perirolandic venous infarction and petechial hemorrhage. 2. New edema in the deep gray nuclei without infarction.  Intracranial MRV: IMPRESSION: 1. Improved flow especially in the deep venous system. Improved flow also seen in the proximal right transverse sinus. 2. Occlusive clot still seen in the superior sagittal, distal right transverse, and right sigmoid dural sinuses. No recanalization of the veins of [place].  EEG adult 02/12/2020  ABNORMALITY -Continue slow, generalized  IMPRESSION: This study is suggestive of moderate diffuse encephalopathy, nonspecific etiology.  At 1531, patient was noted to have right upper extremity jerking.  Concomitant EEG showed high amplitude 2 to 3 Hz rhythmic delta slowing in bitemporal region with evolution in morphology and amplitude but without definite evolution in frequency concerning for focal motor seizure.   EEG adult  02/16/2020  IMPRESSION: This study is suggestive of moderate to severe diffuse encephalopathy, nonspecific etiology.  No seizures or epileptiform discharges were seen throughout the recording.    PHYSICAL EXAM GENERAL: Stuporous. Intubated, laying in bed, in no acute distress Head: Normocephalic and atraumatic. EENT: No OP obstruction, normal conjunctiva.  LUNGS - Normal respiratory effort on room air CV - Tachycardic to the 130's on cardiac  monitor, 2+ pedal pulses ABDOMEN - Soft, non-distended Ext: warm, well perfused Neurological:  Mental Status: Patient is intubated and sedated Stuporous opens eyes partially temporarily only to sternal rub, does not respond to verbal stimuli, does not follow commands.  Opens eyes partially but cannot sustain attention Cranial Nerves: Right eye slightly hypertrophic left eye position normal, doll's eye reflex +.  Both pupils small sluggishly reactive.  Corneal reflexes are present bilaterally face is symmetric.  Tongue midline.  Fundi not visualized Motor: Brisk semipurposeful movements in the left upper and lower extremity to sternal rub and trace movements on the right. Sensory: Unable to access. Deep Tendon Reflexes:  Right: Upper Extremity   Left: Upper extremity   biceps (C-5 to C-6) 2/4   biceps (C-5 to C-6) 2/4 tricep (C7) 2/4    triceps (C7) 2/4 Brachioradialis (C6) 2/4  Brachioradialis (C6) 2/4  Lower Extremity Lower Extremity  quadriceps (L-2 to L-4) 2/4   quadriceps (L-2 to L-4) 2/4 Achilles (S1) 2/4   Achilles (S1) 2/4 Gait: Deferred   ASSESSMENT/PLAN Ms. AIRIELLE PLACKE is a 35 y.o. female with PMHx of asthma and sickle cell trait presented with altered mental status.  CT head revealed a small volume subarachnoid hemorrhage and hyperdense dural sinuses with filling defect concerning for dural venous thrombosis.  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  Extremely delicate situation, that is why, IR is consulted for thrombectomy and patient is with IR now.  CVST - involving SSS, straight sinus, deep cerebral vein, right TS, SS and IJ as well as b/l veins of [place], likely due to dehydration at this point but looking into hypercoagulable state Venous infarcts - Deep watershed white matter infarct, left frontoparietal cortical infarct and small volume subarachnoid hemorrhage   Code Stroke CT head: positive for  a small volume of SAH.  MRI  and MRV 02/12/20 A). Extensive, acute dural venous sinus thrombosis involving the superior sagittal sinus into the right internal jugular vein via the dominant right transverse and sigmoid system. B).Both veins of [place] are thrombosed and there is straight sinus/deep cerebral thrombosis as well. C). Deep watershed white matter infarcts, small volume subarachnoid hemorrhage, and left more than right frontoparietal cortical swelling due to #1.  IR Thrombectomy: 02/13/2020- Large chunks of clot removed with revascularization of the TS bilaterally and partially the RT SS and the torcula.Sig decreased venous congestion of the cortical veins noted post procedure.  MRI and MRV 02/15/20 - Improved flow especially in the deep venous system. Improved flow also seen in the proximal right transverse sinus. Occlusive clot still seen in the superior sagittal, distal right transverse, and right sigmoid dural sinuses. No recanalization of the veins of [place]. Progressive left perirolandic venous infarction and petechial hemorrhage. New edema in the deep gray nuclei without infarction.  IR Thrombectomy: 02/15/2020 - complete revascularization of SSS,Straight S,V of [place] and ICVs ,and RT TS. Sig focal stenosis at the RT TS/SS junction angioplastied with balloon with  improved flow into RT SS.  Plan to repeat MRI and MRV tomorrow  2D Echo - EF is 55 to 60%.  LDL 108  HgbA1c 4.9   Hypercoagulable panel: mildly decreased protein S, but may be an acute phase reactant and should be rechecked as an outpatient in 6 to 8 weeks.  Repeat COVID testing: negative on 02/14/20 and on 02/11/20  VTE prophylaxis - IV heparin  No antithrombotic prior to admission, now on IV heparin for dural venous thrombosis    Add amantadine to facilitate arousal  Therapy recommendations: pending  Disposition: pending  Cerebral edema   On 3% saline  MRI 1/23 new edema in the deep gray nuclei without  infarction  Na goal 150-155  Na monitoring  Na 149->150->153>151  Focal motor seizure  EEG 02/12/20 EEG showed high amplitude 2 to 3 Hz rhythmic delta slowing in bitemporal region with evolution in morphology and amplitude but without definite evolution in frequency concerning for focal motor seizure.  On keppra 1000 bid  Repeat EEG 02/16/20 - moderate to severe diffuse encephalopathy  Continue keppra  Respiratory failure  Intubated on sedation  Not candidate for extubation today  CCM on board  Extubate as able  Hypotension  BP on the low end  Phenylephrine started 02/15/20, trying to wean off . On IVF and TF . BP goal 120-160 . Long-term BP goal normotensive  Anemia - acute blood loss  Hgb - 11.0->8.2->8.6->7.2->7.0>8.2  May related to repeated procedure and IV heparin  1 unit PRBC transfusion on 02/16/2020  CBC monitoring  Hyperlipidemia  Home meds: None  LDL 108, goal < 70  Will consider statin when stable  Continue statin at discharge  Other Stroke Risk Factors  Sickle Cell Trait: No associated risk found due to sickle cell trait.    Other Active Problems  Asthma   Leukocytosis - WBC's - 13.3->15.9 (afebrile) >15.2  Hypokalemia - potassium - 2.9->3.0->3.6->3.1 - supplement  Hypophosphatemia - Phosphorus - 2.0->1.3->1.9  Mastoiditis on MRI.  Unsure if this is contributing to venous sinus thrombosis but will start antibiotics  Hospital day # 5   02/17/2020 10:39 AM  ATTENDING NOTE: I reviewed above note and agree with the assessment and plan. Pt was seen and examined.   RN at bedside.  Patient still intubated, on low-dose fentanyl and Precedex.  Still on Neo but BP  much improved.  However, patient less interactive than yesterday, difficult to arouse.  However, able to open eyes with repetitive stimulation, but not follow simple commands.  Arm leg movement less active as yesterday.  On exam, patient eyes closed, however able to open with  repetitive stimulation.  Not follow commands.  With forced eye opening, bilateral eye initially upper gaze but later admitted point, doll's eyes present, PERRL, not blinking to visual threat.  Corneals present, cough and gag present.  With pain summation left upper and lower extremity at least 2/5 responses, however right upper extremity no movement even with pain, right lower extremity slight withdrawal to the pain.  We will continue current treatment plan, taper off neo and fentanyl as able.  Gradually taper of Precedex if able.  Continue IV fluid and tube feeding.  On 3% saline, sodium 151.  Continue IV heparin.  Plan to repeat MRI MRV tomorrow.  Add amantadine through NG tube to facilitate arousal.  Rosalin Hawking, MD PhD Stroke Neurology 02/17/2020 10:39 AM  This patient is critically ill due to extensive venous thrombosis, SAH, focal seizure, cerebral edema, respiratory failure and at significant risk of neurological worsening, death form status epilepticus, brain herniation, recurrent venous thrombosis, heart failure. This patient's care requires constant monitoring of vital signs, hemodynamics, respiratory and cardiac monitoring, review of multiple databases, neurological assessment, discussion with family, other specialists and medical decision making of high complexity. I spent 35 minutes of neurocritical care time in the care of this patient.   To contact Stroke Continuity provider, please refer to [email or web address] After hours, contact General Neurology

## 2020-02-17 NOTE — Progress Notes (Signed)
NAME:  Jill Clarke, MRN:  [id number], DOB:  1985/07/21, LOS: 5 ADMISSION DATE:  02/12/2020, CONSULTATION DATE:  02/13/2020 REFERRING MD:  Leonel Ramsay CHIEF COMPLAINT:  AMS   Brief History:  34 yoF presented with AMS found to have a small volume SAH and dural venous thrombosis with deep watershed white matter infarcts. PCCM consulted for possible post-op intubation/ventilation management.  History of Present Illness:  57 yoF initially presented to the ED 02/10/20 due to one week of headache, N/V and diarrhea. She was discharged after successful PO challenge. She returned to her mother's house when her mother states she was not behaving normally, rubbing the comforter excessively, turing the light switch on and off repetitively and not verbally response so she returned to the ED 1/19. At that time a CT head revealed small volume subarachnoid hemorrhage and hyperdense dural sinuses with a filling defect concerning for dural venous thrombosis. 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 Neurology was consulted for further management.  Past Medical History:  Asthma Sickle cell trait DM  Significant Hospital Events:  Admitted 1/20 1/21 angiogram w/ clot extraction/ angioplasty 1/23 angiogram w/ angioplasty of venous thrombus  Consults:  PCCM IR  Procedures:  EEG 1/20- This study is suggestive of moderate diffuse encephalopathy, nonspecific etiology.  At 1531, patient was noted to have right upper extremity jerking.  Concomitant EEG showed high amplitude 2 to 3 Hz rhythmic delta slowing in bitemporal region with evolution in morphology and amplitude but without definite evolution in frequency concerning for focal motor seizure.  Significant Diagnostic Tests:   CT-scan of the brain IMPRESSION: 1. The study is positive for a small volume of subarachnoid hemorrhage. There is no midline shift. 2. Hyperdense dural sinuses and  cortical veins especially on the right in addition to a filling defect within the transverse sinus on the right is concerning for dural venous thrombosis and may be the source of the patient's subarachnoid hemorrhage. Emergent MRI/MRV is recommended for further evaluation.  MRI/MR venogram examination of the brain IMPRESSION: 1. Extensive, acute dural venous sinus thrombosis involving the superior sagittal sinus into the right internal jugular vein via the dominant right transverse and sigmoid system. Both veins of [place] are thrombosed and there is straight sinus/deep cerebral thrombosis as well. 2. Deep watershed white matter infarcts, small volume subarachnoid hemorrhage, and left more than right frontoparietal cortical swelling due to #1.  Follow-up CT 1/22: cereberal edema same or slightly worse, clot burden has improved (personal review)  Follow-up CT 124 IMPRESSION: 1. Continued interval decrease in conspicuity of scattered small volume subarachnoid hemorrhage involving both frontoparietal convexities, now only faintly visible and nearly resolved. 2. Continued interval evolution of bilateral watershed territory infarcts, most pronounced at the left frontoparietal convexity. Overall size and distribution is relatively similar. No hemorrhagic transformation by CT. 3. Persistent but improved hyperdensity about the veins of [place] since prior CT. Likewise, the remainder of the dural sinuses also appear less dense and engorged as compared to previous. 4. No other new acute intracranial abnormality.  Interim History / Subjective:  Remains sedated and ventilated  Objective   Blood pressure 138/81, pulse 77, temperature 98 F (36.7 C), temperature source Axillary, resp. rate 17, height 5\' 5"  (1.651 m), weight 70 kg, last menstrual period 02/03/2020, SpO2 100 %.    Vent Mode: PRVC FiO2 (%):  [30 %] 30 % Set Rate:  [16 bmp] 16 bmp Vt Set:  [450 mL] 450 mL PEEP:  [  [place] Pressure:  [19 cmH20-20 cmH20] 19 cmH20   Intake/Output Summary (Last 24 hours) at 02/17/2020 0801 Last data filed at 02/17/2020 0732 Gross per 24 hour  Intake 4167.94 ml  Output 3860 ml  Net 307.94 ml   Filed Weights   02/13/20 1101 02/16/20 0500 02/17/20 0500  Weight: 68.4 kg 69.8 kg 70 kg    Examination: General: Well-nourished well-developed female who sedated on Precedex and fentanyl HEENT: Endotracheal tube Neuro: Follows commands. Right hemiparesis CV: Heart sounds are regular regular rate and rhythm PULM: Diminished in the bases Vent pressure regulated volume control FIO2 30% PEEP five RATE 16+1 VT 450 cc  GI: soft, bsx4 active  GU: Amber urine Extremities: warm/dry, negative edema  Skin: no rashes or lesions  Sodium 151 No chest x-ray  Resolved Hospital Problem list     Assessment & Plan:   34yoF presented with AMS after one week of headache, n/v and diarrhea. CT head and MRI brain demonstrated small volume SAH and extensive dural venous sinus thrombosis.   Extensive dural venous thrombosis  Cerebral edema requiring titration of hypertonic saline Thought to be due to dehydration from GI illness and subsequently leading to congestion and causing SAH. Patient does have a history of sickle cell trait; hypercoagulable panel pending.   P. Per primary Currently on heparin drip Questional if further invasive procedures are planned 3% saline with current sodium 150  Postprocedural respiratory failure- on mechanical ventilation  Plan:  Wean sedation Utilize Precedex to wean off fentanyl If no further invasive procedures attempt spontaneous breathing trial Connect with neurology for any pending further procedures  Best practice (evaluated daily)  Diet: npo Pain/Anxiety/Delirium protocol (if indicated): n/a VAP protocol (if indicated): n/a DVT prophylaxis: heparin GI prophylaxis: protonix Glucose control: SSI Mobility:  bedrest Disposition:pending clinical improvement  Goals of Care:  Per primary   App cct 30 min  Richardson Landry Yazmine Sorey ACNP Acute Care Nurse Practitioner [place] Please consult Amion 02/17/2020, 8:01 AM

## 2020-02-17 NOTE — Evaluation (Signed)
Physical Therapy Evaluation Patient Details Name: Jill Clarke MRN: [id number] DOB: [birth year] Today's Date: 02/17/2020   History of Present Illness  35 y.o. female with a history significant for asthma and sickle cell trait presented to ED 1/19 for N/V and discharged home. Returned 1/20 due to AMS. CT Head revealing a small volume subarachnoid hemorrhage and hyperdense dural sinuses with a filling defect concerning for dural venous thrombosis. 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  Clinical Impression   Pt admitted with above diagnosis. Patient's baseline status unknown and presumed to have been independent. Currently just coming off sedation and able to follow some simple commands with rt extremities. Patient appears to have strong right inattention with head turned fully to her left and away from her ventilator. Anticipate slow progress and likely need for LTACH.  Pt currently with functional limitations due to the deficits listed below (see PT Problem List). Pt will benefit from skilled PT to increase their independence and safety with mobility to allow discharge to the venue listed below.       Follow Up Recommendations LTACH    Equipment Recommendations  Wheelchair (measurements PT);Wheelchair cushion (measurements PT);Hospital bed    Recommendations for Other Services       Precautions / Restrictions Precautions Precautions: Other (comment) Precaution Comments: on vent; multiple lines      Mobility  Bed Mobility               General bed mobility comments: total assist supine to chair position; head leans to left and repositioned to neutral with pillows and towels rolls    Transfers                    Ambulation/Gait                Stairs            Wheelchair Mobility    Modified Rankin (Stroke Patients Only) Modified Rankin (Stroke Patients Only) Pre-Morbid  Rankin Score: No symptoms Modified Rankin: Severe disability     Balance                                             Pertinent Vitals/Pain Pain Assessment:  (withdrew to pain LUE; RUE jumped with pain wiht cervical ROM)    Home Living Family/patient expects to be discharged to:: Unsure                 Additional Comments: per chart was "in between places" and staying with friends    Prior Function Level of Independence: Independent         Comments: assumed     Hand Dominance        Extremity/Trunk Assessment   Upper Extremity Assessment Upper Extremity Assessment: Defer to OT evaluation    Lower Extremity Assessment Lower Extremity Assessment: RLE deficits/detail;LLE deficits/detail RLE Deficits / Details: no tone noted; PROM WNL LLE Deficits / Details: actively moved toes on command; PROM WFL    Cervical / Trunk Assessment Cervical / Trunk Assessment: Other exceptions Cervical / Trunk Exceptions: Keeps head rotated to her left (?neglect)  Communication   Communication: Other (comment) (no attempts; intubated)  Cognition Arousal/Alertness: Lethargic;Suspect due to medications (off dirprivan and on very little precedex) Behavior During Therapy: Flat affect Overall Cognitive Status: Impaired/Different from baseline  General Comments: Pt able to follow simple commnads (close eyes, wiggle toes, wiggle fingers (only on left)      General Comments General comments (skin integrity, edema, etc.): PS PEEP 5, 30% RR 18-26; HR 86-112; BP 154/87 158/85    Exercises     Assessment/Plan    PT Assessment Patient needs continued PT services  PT Problem List Decreased strength;Decreased range of motion;Decreased activity tolerance;Decreased balance;Decreased mobility;Decreased cognition;Decreased knowledge of use of DME;Decreased safety awareness;Impaired sensation;Impaired tone;Obesity       PT  Treatment Interventions DME instruction;Functional mobility training;Therapeutic activities;Therapeutic exercise;Balance training;Neuromuscular re-education;Cognitive remediation;Patient/family education    PT Goals (Current goals can be found in the Care Plan section)  Acute Rehab PT Goals Patient Stated Goal: unable PT Goal Formulation: Patient unable to participate in goal setting Time For Goal Achievement: 03/02/20 Potential to Achieve Goals: Fair    Frequency Min 3X/week   Barriers to discharge Decreased caregiver support      Co-evaluation PT/OT/SLP Co-Evaluation/Treatment: Yes Reason for Co-Treatment: Complexity of the patient's impairments (multi-system involvement);Necessary to address cognition/behavior during functional activity PT goals addressed during session: Mobility/safety with mobility;Balance         AM-PAC PT "6 Clicks" Mobility  Outcome Measure Help needed turning from your back to your side while in a flat bed without using bedrails?: Total Help needed moving from lying on your back to sitting on the side of a flat bed without using bedrails?: Total Help needed moving to and from a bed to a chair (including a wheelchair)?: Total Help needed standing up from a chair using your arms (e.g., wheelchair or bedside chair)?: Total Help needed to walk in hospital room?: Total Help needed climbing 3-5 steps with a railing? : Total 6 Click Score: 6    End of Session   Activity Tolerance: Patient limited by lethargy Patient left: in bed;with call bell/phone within reach;with bed alarm set;with SCD's reapplied;with restraints reapplied (bil mittens) Nurse Communication: Other (comment) (following commans with rt extremities) PT Visit Diagnosis: Other symptoms and signs involving the nervous system (Y40.347)    Time: 4259-5638 PT Time Calculation (min) (ACUTE ONLY): 28 min   Charges:   PT Evaluation $PT Eval Moderate Complexity: 1 Mod           Arby Barrette,  PT Pager [phone]   Rexanne Mano 02/17/2020, 1:14 PM

## 2020-02-17 NOTE — Progress Notes (Signed)
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 Estanislado Pandy; with residual thrombus load s/p cerebral arteriogram with mechanical thrombectomy of superior sagittal sinus, straight sinus, vein of galen, bilateral IJs, and right transverse sinus via right (venous) and left (arterial) femoral approach 02/15/2020 by Dr. Estanislado Pandy. Patient laying in bed intubated with sedation. She intermittently opens eyes to voice. She does not follow simple commands. Can spontaneously move LUE, LLE slightly withdraws from pain, no movements of right side. Right (venous) and left (arterial) femoral puncture sites c/d/i.   Allergies: Patient has no known allergies.  Medications: Prior to Admission medications   Not on File     Vital Signs: BP 138/88   Pulse (!) 102   Temp 97.6 F (36.4 C) (Axillary)   Resp (!) 22   Ht 5\' 5"  (1.651 m)   Wt 154 lb 5.2 oz (70 kg)   LMP 02/03/2020 (Approximate)   SpO2 100%   BMI 25.68 kg/m   Physical Exam Vitals and nursing note reviewed.  Constitutional:      General: She is not in acute distress.    Comments: Intubated with sedation.   Pulmonary:     Effort: Pulmonary effort is normal. No respiratory distress.     Comments: Intubated with sedation.  Skin:    General: Skin is warm and dry.     Comments: Right (venous) and left (arterial) femoral puncture sites both soft without active bleeding or hematoma.   Neurological:     Comments: Intubated with sedation. She intermittently opens eyes to voice. She does not follow  simple commands. PERRL bilaterally. Left gaze preference. Can spontaneously move LUE, LLE slightly withdraws from pain, no movements of right side. Distal pulses (DPs) 2+ bilaterally.      Imaging: DG Abd 1 View  Result Date: 02/14/2020 CLINICAL DATA:  OG tube placement. EXAM: ABDOMEN - 1 VIEW COMPARISON:  None. FINDINGS: Enteric tube tip and side-port project over the stomach. Lung bases are clear. IMPRESSION: Enteric tube tip and side-port project over the stomach. Electronically Signed   By: Lovey Newcomer M.D.   On: 02/14/2020 13:07   CT Head Wo Contrast  Result Date: 02/16/2020 CLINICAL DATA:  Follow-up examination for acute stroke. EXAM: CT HEAD WITHOUT CONTRAST TECHNIQUE: Contiguous axial images were obtained from the base of the skull through the vertex without intravenous contrast. COMPARISON:  Prior CT and MRI from 02/15/2020. FINDINGS: Brain: Continued interval decrease in conspicuity of scattered small volume subarachnoid hemorrhage involving both frontal parietal convexities, now only faintly visible and nearly resolved. No significant basilar cistern hemorrhage now evident. Continued interval evolution of bilateral watershed territory infarcts, most pronounced at the left frontoparietal convexity. Overall size and distribution is relatively similar. No hemorrhagic transformation evident by CT. Previously noted edema involving the deep gray nuclei difficult to visualize by CT, although there are suspected persistent changes. No new hemorrhage or acute ischemic changes. No midline shift or significant mass effect. No hydrocephalus or extra-axial fluid collection. Vascular: Persistent but improved hyperdensity seen about the veins of true large since prior CT. Likewise, the superior sagittal sinus appears less dense and engorged as  compared to previous. Remainder of the dural sinuses also appear less dense. No hyperdense arterial vessel. Skull: Scalp soft tissues demonstrate no new finding.  Calvarium unchanged. Sinuses/Orbits: Globes and orbital soft tissues within normal limits. Extensive mucosal thickening with air-fluid levels noted throughout the paranasal sinuses similar to previous. Trace right mastoid effusion. Other: None. IMPRESSION: 1. Continued interval decrease in conspicuity of scattered small volume subarachnoid hemorrhage involving both frontoparietal convexities, now only faintly visible and nearly resolved. 2. Continued interval evolution of bilateral watershed territory infarcts, most pronounced at the left frontoparietal convexity. Overall size and distribution is relatively similar. No hemorrhagic transformation by CT. 3. Persistent but improved hyperdensity about the veins of [place]rolard since prior CT. Likewise, the remainder of the dural sinuses also appear less dense and engorged as compared to previous. 4. No other new acute intracranial abnormality. Electronically Signed   By: Jeannine Boga M.D.   On: 02/16/2020 00:59   CT HEAD WO CONTRAST  Result Date: 02/14/2020 CLINICAL DATA:  Stroke, follow up EXAM: CT HEAD WITHOUT CONTRAST TECHNIQUE: Contiguous axial images were obtained from the base of the skull through the vertex without intravenous contrast. COMPARISON:  02/12/2020. FINDINGS: Brain: Decreased conspicuity of small volume subarachnoid hemorrhage along the bilateral frontoparietal regions within the basilar cistern. No new site of intracranial hemorrhage. Known small bilateral cerebral infarcts are not well demonstrated on this exam. Increased conspicuity of left cerebral edema. No midline shift or mass lesion.  No ventriculomegaly. Vascular: Hyperdense appearance of the cortical veins, posterosuperior sagittal sinus and right transverse sinus is less conspicuous than prior exam. No hyperdense arterial vessels. Skull: Negative for fracture or focal lesion. Sinuses/Orbits: No acute finding. Mild pansinus mucosal thickening with layering secretions. Other: None.  IMPRESSION: Small volume bilateral frontoparietal and basilar cistern subarachnoid hemorrhage is unchanged. No new intracranial hemorrhage. Small bilateral cerebral infarcts are not well demonstrated on this exam. Increased conspicuity of left cerebral edema. Hyperdense appearance of the cortical veins, superior sagittal and right transverse sinus is less conspicuous than prior exam. Electronically Signed   By: Primitivo Gauze M.D.   On: 02/14/2020 09:58   MR BRAIN WO CONTRAST  Result Date: 02/15/2020 CLINICAL DATA:  Follow-up dural sinus thrombosis. Interval heparin drip and mechanical thrombectomy. EXAM: MRI HEAD WITHOUT CONTRAST MRV HEAD WITHOUT CONTRAST TECHNIQUE: Multiplanar, multiecho pulse sequences of the brain and surrounding structures were obtained without intravenous contrast. Angiographic images of the intracranial venous structures were obtained using MRV technique without intravenous contrast. COMPARISON:  Brain MRI from 3 days ago FINDINGS: MRI HEAD WITHOUT CONTRAST Brain: Bilateral deep watershed white matter infarcts which appears similar in extent. The degree of restricted diffusion appears greater in these infarcts, compatible with interval evolution. Venous infarct along the high left central sulcus is progressed in extent. Small infarct in the right paramedian splenium of the corpus callosum with mild increase. T2 signal without restricted diffusion in the right more than left basal ganglia and in the bilateral thalamus, progressed. There is increased FLAIR signal along the cerebral convexities which may be from engorged vessels or the subarachnoid blood seen on admission CT. On gradient imaging there is less vessel congestion suspected. Increased petechial hemorrhage along the left-sided venous infarct. Vascular: T1 hyperintense evolution of the dural venous sinus thrombosis which is most confluent in the superior sagittal sinus, right transverse sinus, and right sigmoid sinus. Clot is  also seen in the bilateral vein of Trolard. Skull and upper cervical spine: Normal marrow signal Sinuses/Orbits: Negative MR VENOGRAM WITHOUT CONTRAST  There has been improvement of venous flow after thrombectomy. Improvements are primarily seen at the level of the torcula and proximal right transverse sinus. There is also now straight sinus and internal cerebral vein flow. Occlusive thrombus is still seen at the level of the superior sagittal sinus, distal right transverse, and right sigmoid sinuses. Veins of Trolard remain thrombosed, best seen by noncontrast T1 weighted imaging. IMPRESSION: Brain MRI: 1. Progressive left perirolandic venous infarction and petechial hemorrhage. 2. New edema in the deep gray nuclei without infarction. Intracranial MRV: 1. Improved flow especially in the deep venous system. Improved flow also seen in the proximal right transverse sinus. 2. Occlusive clot still seen in the superior sagittal, distal right transverse, and right sigmoid dural sinuses. No recanalization of the veins of Trolard. Electronically Signed   By: Monte Fantasia M.D.   On: 02/15/2020 05:10   MR Venogram Head  Result Date: 02/15/2020 CLINICAL DATA:  Follow-up dural sinus thrombosis. Interval heparin drip and mechanical thrombectomy. EXAM: MRI HEAD WITHOUT CONTRAST MRV HEAD WITHOUT CONTRAST TECHNIQUE: Multiplanar, multiecho pulse sequences of the brain and surrounding structures were obtained without intravenous contrast. Angiographic images of the intracranial venous structures were obtained using MRV technique without intravenous contrast. COMPARISON:  Brain MRI from 3 days ago FINDINGS: MRI HEAD WITHOUT CONTRAST Brain: Bilateral deep watershed white matter infarcts which appears similar in extent. The degree of restricted diffusion appears greater in these infarcts, compatible with interval evolution. Venous infarct along the high left central sulcus is progressed in extent. Small infarct in the right  paramedian splenium of the corpus callosum with mild increase. T2 signal without restricted diffusion in the right more than left basal ganglia and in the bilateral thalamus, progressed. There is increased FLAIR signal along the cerebral convexities which may be from engorged vessels or the subarachnoid blood seen on admission CT. On gradient imaging there is less vessel congestion suspected. Increased petechial hemorrhage along the left-sided venous infarct. Vascular: T1 hyperintense evolution of the dural venous sinus thrombosis which is most confluent in the superior sagittal sinus, right transverse sinus, and right sigmoid sinus. Clot is also seen in the bilateral vein of Trolard. Skull and upper cervical spine: Normal marrow signal Sinuses/Orbits: Negative MR VENOGRAM WITHOUT CONTRAST There has been improvement of venous flow after thrombectomy. Improvements are primarily seen at the level of the torcula and proximal right transverse sinus. There is also now straight sinus and internal cerebral vein flow. Occlusive thrombus is still seen at the level of the superior sagittal sinus, distal right transverse, and right sigmoid sinuses. Veins of Trolard remain thrombosed, best seen by noncontrast T1 weighted imaging. IMPRESSION: Brain MRI: 1. Progressive left perirolandic venous infarction and petechial hemorrhage. 2. New edema in the deep gray nuclei without infarction. Intracranial MRV: 1. Improved flow especially in the deep venous system. Improved flow also seen in the proximal right transverse sinus. 2. Occlusive clot still seen in the superior sagittal, distal right transverse, and right sigmoid dural sinuses. No recanalization of the veins of Trolard. Electronically Signed   By: Monte Fantasia M.D.   On: 02/15/2020 05:10   DG CHEST PORT 1 VIEW  Result Date: 02/14/2020 CLINICAL DATA:  Endotracheal tube placement. EXAM: PORTABLE CHEST 1 VIEW COMPARISON:  One day prior FINDINGS: Endotracheal tube  terminates 3.9 cm above carina. Nasogastric tube extends beyond the inferior aspect of the film. Right-sided PICC line is difficult to follow centrally. Most likely with tip at cavoatrial junction. Midline trachea. Normal heart size. No  pleural effusion or pneumothorax. Improved left lower lobe aeration with mild bibasilar atelectasis remaining. IMPRESSION: Appropriate position of endotracheal tube. Bibasilar atelectasis with significantly improved left lower lobe aeration. Electronically Signed   By: Abigail Miyamoto M.D.   On: 02/14/2020 13:08   DG Chest Port 1 View  Result Date: 02/13/2020 CLINICAL DATA:  01/19/2014 EXAM: PORTABLE CHEST 1 VIEW COMPARISON:  Respiratory FINDINGS: Later endotracheal tube is seen with its tip just within the right mainstem bronchus. Nasogastric tube tip is seen within the proximal body of the stomach with its proximal side hole at the expected gastroesophageal junction. Retrocardiac opacity likely relates to partial left lower lobe collapse. Right lung is clear. No pneumothorax or pleural effusion. Cardiac size within normal limits. Pulmonary vascularity is normal. IMPRESSION: Right mainstem intubation. Withdrawal of the endotracheal tube by approximately 2.5 cm may better position the catheter. Partial left lower lobe collapse. Nasogastric tube proximal side hole at the gastroesophageal junction. Advancement by roughly 5 cm may better position the catheter. These results will be called to the ordering clinician or representative by the Radiologist Assistant, and communication documented in the PACS or [employer]. Electronically Signed   By: Fidela Salisbury MD   On: 02/13/2020 23:35   EEG adult  Result Date: 02/16/2020 Lora Havens, MD     02/16/2020 10:46 AM Patient Name: Jill Clarke MRN: [id number] Epilepsy Attending: Lora Havens Referring Physician/Provider: Dr Rosalin Hawking Date: 02/16/2020 Duration: 24.48 mins Patient history: 35 year old female with altered  mental status.  EEG to evaluate for seizures. Level of alertness: Comatose AEDs during EEG study: LEV Technical aspects: This EEG study was done with scalp electrodes positioned according to the 10-20 International system of electrode placement. Electrical activity was acquired at a sampling rate of 500Hz  and reviewed with a high frequency filter of 70Hz  and a low frequency filter of 1Hz . EEG data were recorded continuously and digitally stored. Description: EEG showed continuous polymorphic 3 to 6 Hz theta-delta slowing. Hperventilation and photic stimulation were not performed.   ABNORMALITY -Continuous slow, generalized IMPRESSION: This study is suggestive of moderate to severe diffuse encephalopathy, nonspecific etiology.  No seizures or epileptiform discharges were seen throughout the recording. Lora Havens   ECHOCARDIOGRAM COMPLETE  Result Date: 02/16/2020    ECHOCARDIOGRAM REPORT   Patient Name:   SHONTAL HIRN Date of Exam: 02/16/2020 Medical Rec #:  [id number]          Height:       65.0 in Accession #:    [id number]         Weight:       153.9 lb Date of Birth:  12-28-1985         BSA:          1.770 m Patient Age:    34 years           BP:           99/80 mmHg Patient Gender: F                  HR:           96 bpm. Exam Location:  Inpatient Procedure: 2D Echo Indications:    stroke 434.91  History:        Patient has no prior history of Echocardiogram examinations.  Sonographer:    Johny Chess Referring Phys: [place]  1. Left ventricular ejection fraction, by estimation, is 55 to 60%. The left ventricle  has normal function. The left ventricle has no regional wall motion abnormalities. Left ventricular diastolic parameters were normal.  2. Right ventricular systolic function is normal. The right ventricular size is normal. There is normal pulmonary artery systolic pressure.  3. Mild mitral valve leaflet thickening and calcification. Trivial mitral valve  regurgitation.  4. The aortic valve is tricuspid. There is mild thickening of the aortic valve. Aortic valve regurgitation is not visualized. No aortic stenosis is present.  5. The inferior vena cava is normal in size with <50% respiratory variability, suggesting right atrial pressure of 8 mmHg. Conclusion(s)/Recommendation(s): No intracardiac source of embolism detected on this transthoracic study. A transesophageal echocardiogram is recommended to exclude cardiac source of embolism if clinically indicated. FINDINGS  Left Ventricle: Left ventricular ejection fraction, by estimation, is 55 to 60%. The left ventricle has normal function. The left ventricle has no regional wall motion abnormalities. The left ventricular internal cavity size was normal in size. There is  no left ventricular hypertrophy. Left ventricular diastolic parameters were normal. Right Ventricle: The right ventricular size is normal. Right vetricular wall thickness was not well visualized. Right ventricular systolic function is normal. There is normal pulmonary artery systolic pressure. The tricuspid regurgitant velocity is 2.06 m/s, and with an assumed right atrial pressure of 3 mmHg, the estimated right ventricular systolic pressure is 40.9 mmHg. Left Atrium: Left atrial size was normal in size. Right Atrium: Right atrial size was normal in size. Pericardium: There is no evidence of pericardial effusion. Mitral Valve: The mitral valve is abnormal. There is mild thickening of the mitral valve leaflet(s). There is mild calcification of the mitral valve leaflet(s). Trivial mitral valve regurgitation. Tricuspid Valve: The tricuspid valve is normal in structure. Tricuspid valve regurgitation is trivial. Aortic Valve: The aortic valve is tricuspid. There is mild thickening of the aortic valve. Aortic valve regurgitation is not visualized. No aortic stenosis is present. Pulmonic Valve: The pulmonic valve was normal in structure. Pulmonic valve  regurgitation is trivial. Aorta: The aortic root and ascending aorta are structurally normal, with no evidence of dilitation. Venous: The inferior vena cava is normal in size with less than 50% respiratory variability, suggesting right atrial pressure of 8 mmHg. IAS/Shunts: No atrial level shunt detected by color flow Doppler.  LEFT VENTRICLE PLAX 2D LVIDd:         4.40 cm  Diastology LVIDs:         3.20 cm  LV e' medial:    12.70 cm/s LV PW:         1.00 cm  LV E/e' medial:  7.3 LV IVS:        0.80 cm  LV e' lateral:   15.60 cm/s LVOT diam:     1.90 cm  LV E/e' lateral: 6.0 LV SV:         50 LV SV Index:   28 LVOT Area:     2.84 cm  RIGHT VENTRICLE             IVC RV S prime:     15.70 cm/s  IVC diam: 1.90 cm TAPSE (M-mode): 2.3 cm LEFT ATRIUM             Index       RIGHT ATRIUM           Index LA diam:        3.00 cm 1.70 cm/m  RA Area:     11.90 cm LA Vol (A2C):   32.6 ml 18.42  ml/m RA Volume:   27.70 ml  15.65 ml/m LA Vol (A4C):   32.5 ml 18.37 ml/m LA Biplane Vol: 34.1 ml 19.27 ml/m  AORTIC VALVE LVOT Vmax:   97.30 cm/s LVOT Vmean:  73.400 cm/s LVOT VTI:    0.177 m  AORTA Ao Root diam: 2.50 cm Ao Asc diam:  2.30 cm MITRAL VALVE               TRICUSPID VALVE MV Area (PHT): 4.31 cm    TR Peak grad:   17.0 mmHg MV Decel Time: 176 msec    TR Vmax:        206.00 cm/s MV E velocity: 93.00 cm/s MV A velocity: 81.80 cm/s  SHUNTS MV E/A ratio:  1.14        Systemic VTI:  0.18 m                            Systemic Diam: 1.90 cm Gwyndolyn Kaufman MD Electronically signed by Gwyndolyn Kaufman MD Signature Date/Time: 02/16/2020/2:49:18 PM    Final    [place] EKG SITE RITE  Result Date: 02/13/2020 If Site Rite image not attached, placement could not be confirmed due to current cardiac rhythm.   Labs:  CBC: Recent Labs    02/14/20 1111 02/15/20 1032 02/15/20 1240 02/16/20 0419 02/16/20 1525 02/16/20 2231 02/17/20 0400  WBC 15.9* 14.0*  --  15.2*  --   --  13.0*  HGB 8.6* 8.2*   < > 7.2* 7.0* 8.1* 8.2*   HCT 27.7* 26.6*   < > 23.2* 22.7* 25.7* 26.2*  PLT 575* 536*  --  480*  --   --  467*   < > = values in this interval not displayed.    COAGS: Recent Labs    02/12/20 0417  INR 1.2    BMP: Recent Labs    02/15/20 1032 02/15/20 1240 02/15/20 1612 02/15/20 2300 02/16/20 0419 02/16/20 1006 02/16/20 1525 02/16/20 2231 02/17/20 0400  NA 153* 154*   < > 150* 150* 153* 153* 153* 151*  K 3.2* 2.6*  --  3.1* 3.6  --   --   --  3.1*  CL 116* 114*  --  116* 118*  --   --   --  117*  CO2 27  --   --  26 27  --   --   --  25  GLUCOSE 103* 96  --  138* 131*  --   --   --  207*  BUN <5* <3*  --  5* 6  --   --   --  5*  CALCIUM 8.6*  --   --  8.7* 8.5*  --   --   --  8.7*  CREATININE 0.42* 0.20*  --  0.41* 0.39*  --   --   --  0.39*  GFRNONAA >60  --   --  >60 >60  --   --   --  >60   < > = values in this interval not displayed.    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 Estanislado Pandy; with residual thrombus load s/p cerebral arteriogram with mechanical thrombectomy of superior sagittal sinus, straight sinus, vein of galen, bilateral IJs, and right transverse sinus via right (venous) and left (arterial) femoral approach 02/15/2020 by Dr. Estanislado Pandy. Patient's condition guarded- remains intubated/sedated, intermittently opens eyes to voice, does not follow simple command, can spontaneously move LUE, LLE slightly withdraws from pain, no movements of right side. Right (venous) and left (arterial) femoral puncture sites stable, distal pulses stable. Further plans per  neurology/CCM- appreciate and agree with management. NIR to follow.   Electronically Signed: Earley Abide, PA-C 02/17/2020, 9:00 AM   I spent a total of 15 Minutes at the the patient's bedside AND on the patient's hospital floor or unit, greater than 50% of which was counseling/coordinating care for dural venous sinus thrombosis s/p revascularization.

## 2020-02-18 ENCOUNTER — Inpatient Hospital Stay (HOSPITAL_COMMUNITY): Payer: 59

## 2020-02-18 LAB — BASIC METABOLIC PANEL
Anion gap: 6 (ref 5–15)
BUN: 5 mg/dL — ABNORMAL LOW (ref 6–20)
CO2: 28 mmol/L (ref 22–32)
Calcium: 8.8 mg/dL — ABNORMAL LOW (ref 8.9–10.3)
Chloride: 111 mmol/L (ref 98–111)
Creatinine, Ser: 0.37 mg/dL — ABNORMAL LOW (ref 0.44–1.00)
GFR, Estimated: >60 mL/min (ref >60)
Glucose, Bld: 111 mg/dL — ABNORMAL HIGH (ref 70–99)
Potassium: 3.8 mmol/L (ref 3.5–5.1)
Sodium: 145 mmol/L (ref 135–145)

## 2020-02-18 LAB — MAGNESIUM: Magnesium: 2.2 mg/dL (ref 1.7–2.4)

## 2020-02-18 LAB — SODIUM
Sodium: 146 mmol/L — ABNORMAL HIGH (ref 135–145)
Sodium: 145 mmol/L (ref 135–145)
Sodium: 145 mmol/L (ref 135–145)

## 2020-02-18 LAB — GLUCOSE, CAPILLARY
Glucose-Capillary: 110 mg/dL — ABNORMAL HIGH (ref 70–99)
Glucose-Capillary: 85 mg/dL (ref 70–99)
Glucose-Capillary: 87 mg/dL (ref 70–99)
Glucose-Capillary: 89 mg/dL (ref 70–99)
Glucose-Capillary: 92 mg/dL (ref 70–99)
Glucose-Capillary: 93 mg/dL (ref 70–99)

## 2020-02-18 LAB — PROTHROMBIN GENE MUTATION

## 2020-02-18 LAB — CBC
HCT: 24.5 % — ABNORMAL LOW (ref 36.0–46.0)
Hemoglobin: 8 g/dL — ABNORMAL LOW (ref 12.0–15.0)
MCH: 30.1 pg (ref 26.0–34.0)
MCHC: 32.7 g/dL (ref 30.0–36.0)
MCV: 92.1 fL (ref 80.0–100.0)
Platelets: 483 10*3/uL — ABNORMAL HIGH (ref 150–400)
RBC: 2.66 MIL/uL — ABNORMAL LOW (ref 3.87–5.11)
RDW: 14.6 % (ref 11.5–15.5)
WBC: 14.4 10*3/uL — ABNORMAL HIGH (ref 4.0–10.5)
nRBC: 0 % (ref 0.0–0.2)

## 2020-02-18 LAB — HEPARIN LEVEL (UNFRACTIONATED)
Heparin Unfractionated: 0.3 IU/mL (ref 0.30–0.70)
Heparin Unfractionated: 0.24 IU/mL — ABNORMAL LOW (ref 0.30–0.70)
Heparin Unfractionated: 0.23 IU/mL — ABNORMAL LOW (ref 0.30–0.70)

## 2020-02-18 LAB — PHOSPHORUS: Phosphorus: 4.1 mg/dL (ref 2.5–4.6)

## 2020-02-18 IMAGING — DX DG CHEST 1V PORT
1 series · 1 of 1 positions shown · non-contrast
Comparison: [DATE].

CLINICAL DATA: Abnormal respirations.

EXAM:
PORTABLE CHEST 1 VIEW

[chest ap]
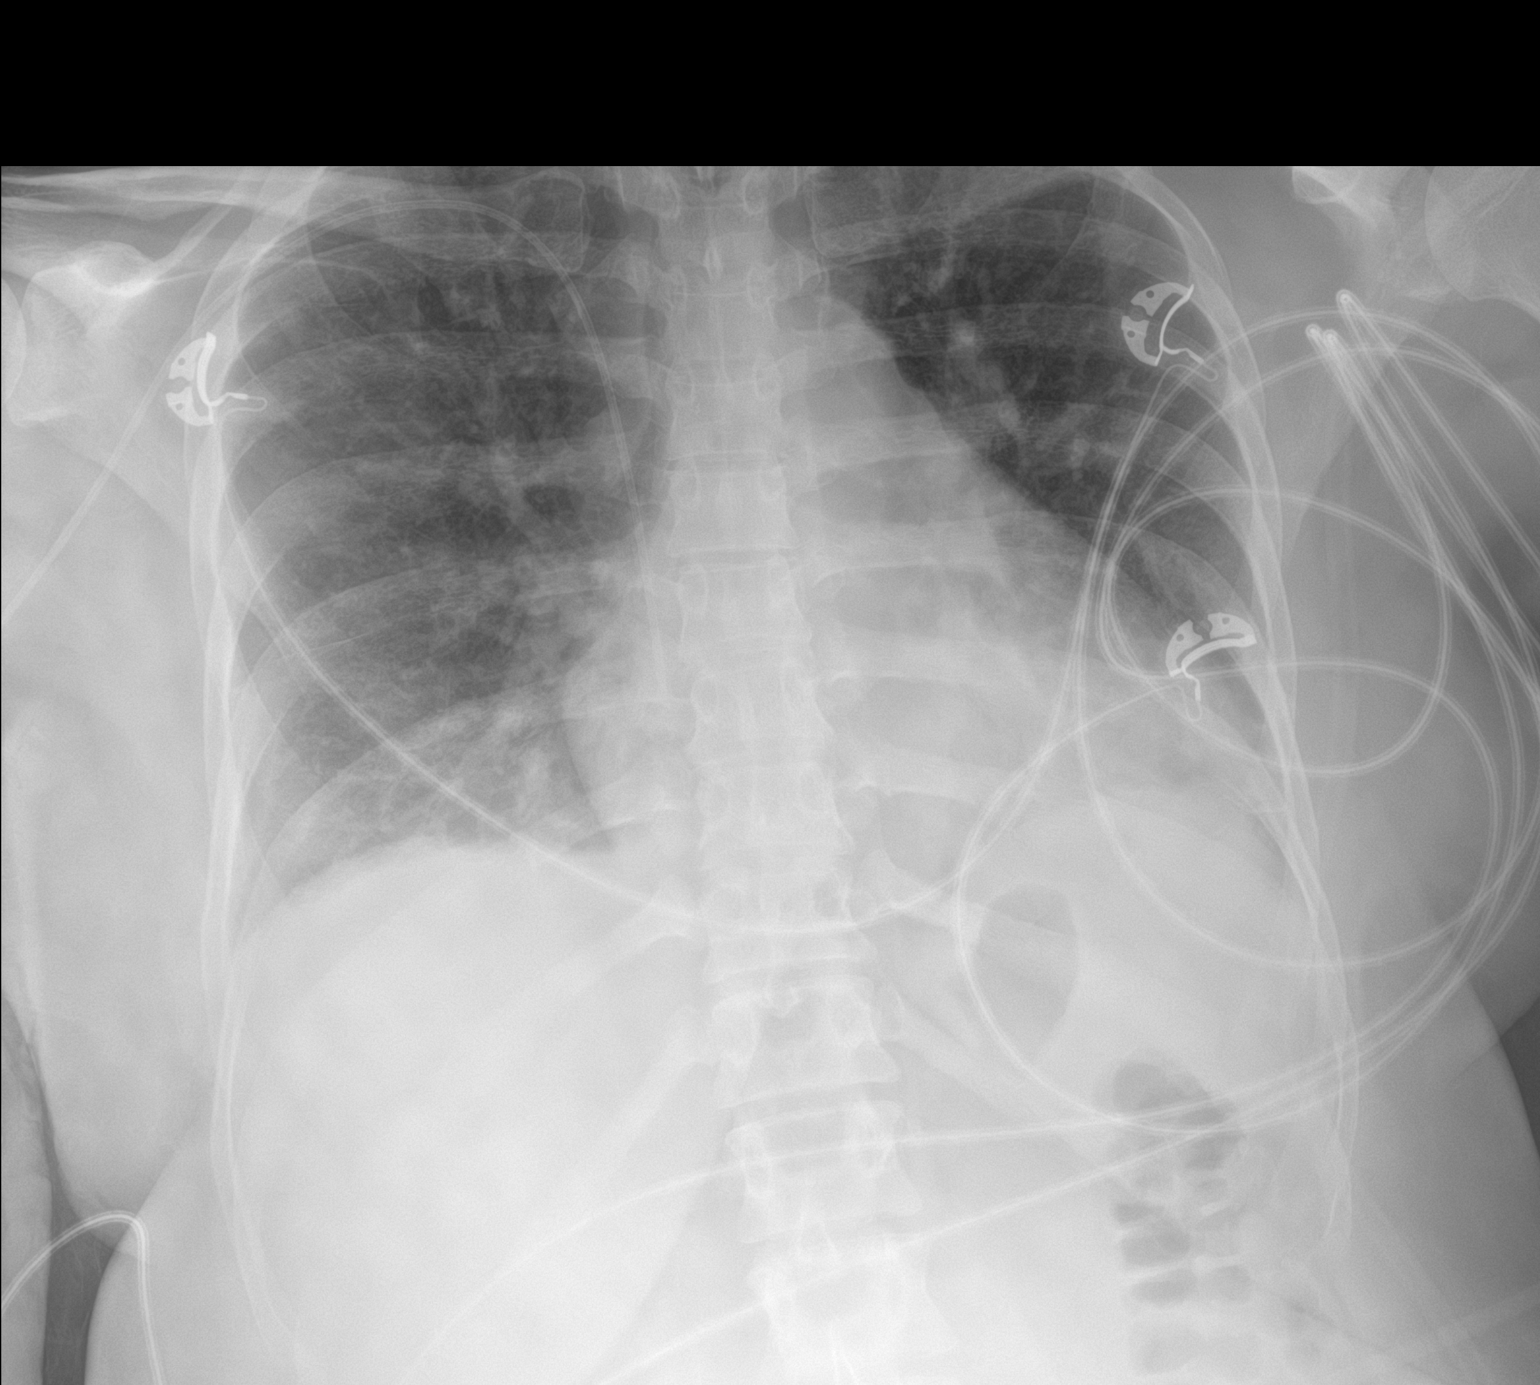

[1 of 1 positions shown; findings below may reference images not displayed]

FINDINGS: Interim extubation of NG tube. Right PICC line noted with tip over
cavoatrial junction. Borderline cardiomegaly. Diffuse bilateral
pulmonary infiltrates/edema. No pleural effusion or pneumothorax.
IMPRESSION: 1. Interim extubation and removal of NG tube. Right PICC line noted
with tip over cavoatrial junction.
2. Borderline cardiomegaly.
3. Diffuse bilateral pulmonary infiltrates/edema noted on today's
exam.

## 2020-02-18 IMAGING — MR MR HEAD W/O CM
8 of 14 series · 27 of 48 positions shown · IV contrast (agent unspecified)
Comparison: Head CT [DATE], report from thrombectomy
[DATE], MRI/MRV head [DATE]

CONTRAST:  7mL GADAVIST GADOBUTROL 1 MMOL/ML IV SOLN

CLINICAL DATA: Stroke, follow-up.

EXAM:
MRI HEAD WITH CONTRAST
MRV HEAD WITHOUT AND WITH CONTRAST
TECHNIQUE: Multiplanar, multiecho pulse sequences of the brain and surrounding
structures were obtained with intravenous contrast. Angiographic
images of the intracranial venous structures were obtained using MRV
technique without and with intravenous contrast.

[Series 4: DWI · axial · 3.0mm · 1.09mm/px · z∈[-59,+71]mm · 5 of 90 slices shown (1 of 4)]
[im 1/90]
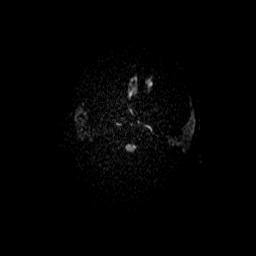
[im 23/90]
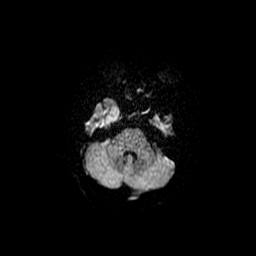
[im 45/90]
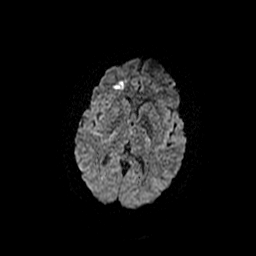
[im 67/90]
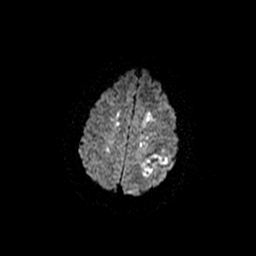
[im 90/90]
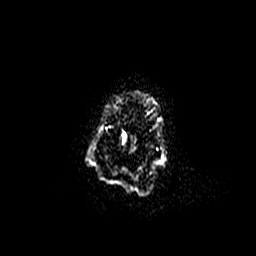

[Series 13: FLAIR · axial · 5.0mm · 0.43mm/px · z∈[-105,+30]mm · 2 of 24 slices shown]
[im 1/24]
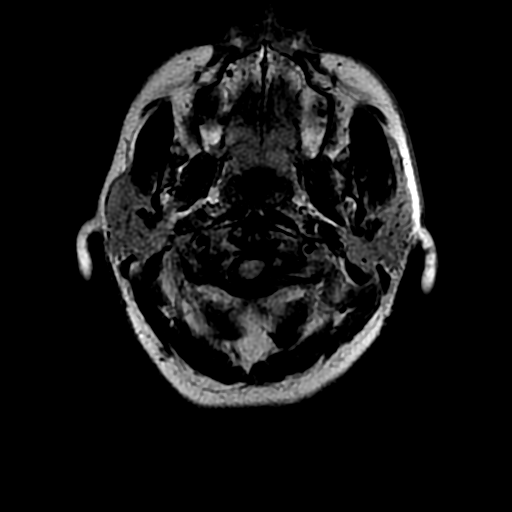
[im 24/24]
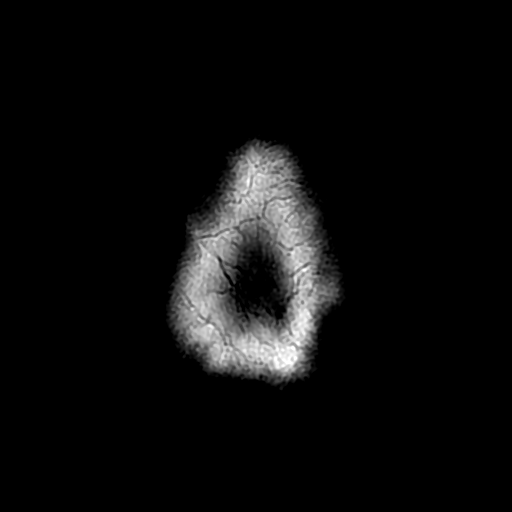

[Series 16: DWI · coronal · 5.0mm · 1.09mm/px · 5 of 68 slices shown (2 of 4)]
[im 1/68]
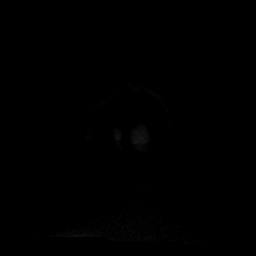
[im 17/68]
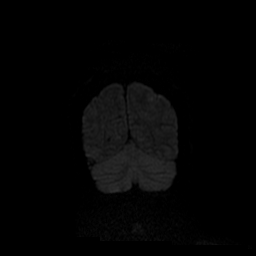
[im 34/68]
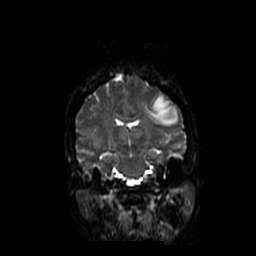
[im 51/68]
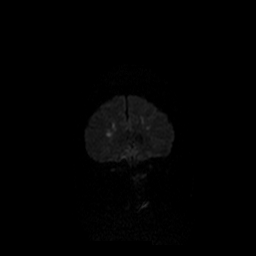
[im 68/68]
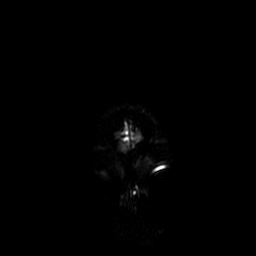

[Series 18: T1 post-contrast · axial · 3.0mm · 0.47mm/px · z∈[-102,+33]mm · 6 of 92 slices shown (1 of 3)]
[im 1/92]
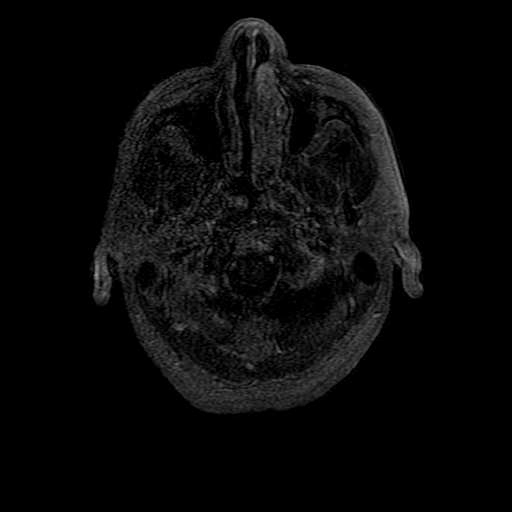
[im 19/92]
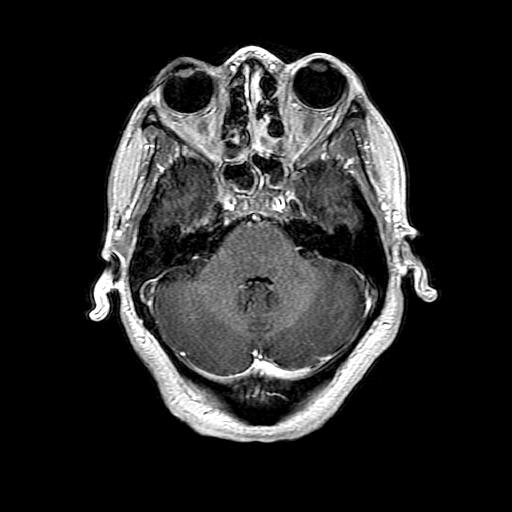
[im 37/92]
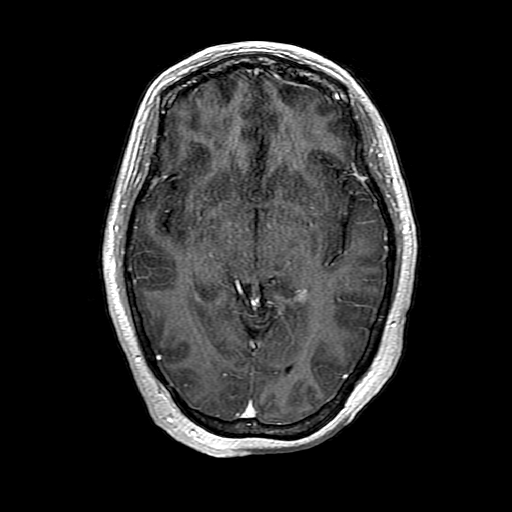
[im 55/92]
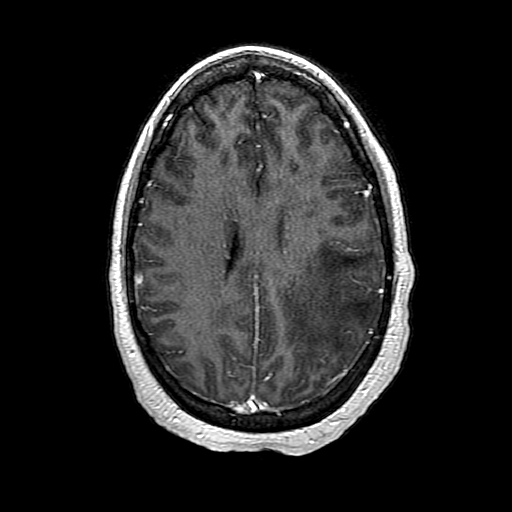
[im 73/92]
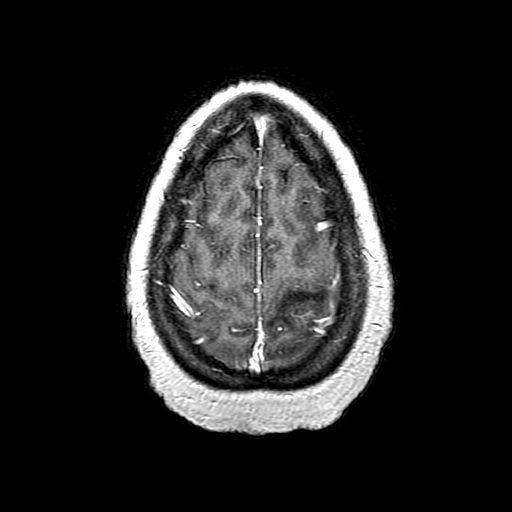
[im 92/92]
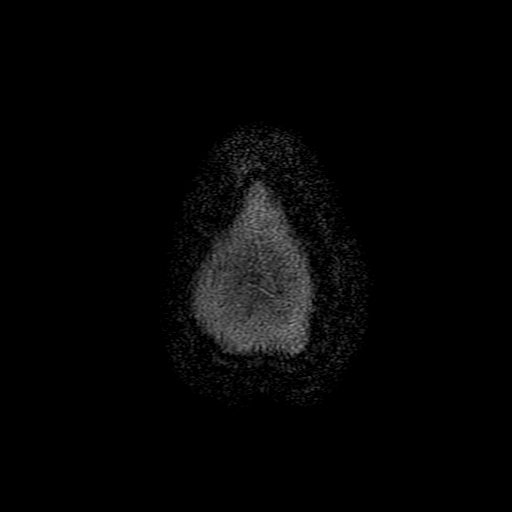

[Series 19: T1 post-contrast · coronal · 5.0mm · 0.43mm/px · 2 of 30 slices shown (2 of 3)]
[im 1/30]
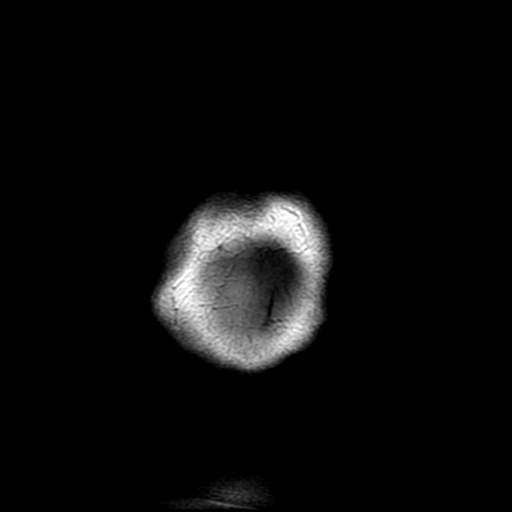
[im 30/30]
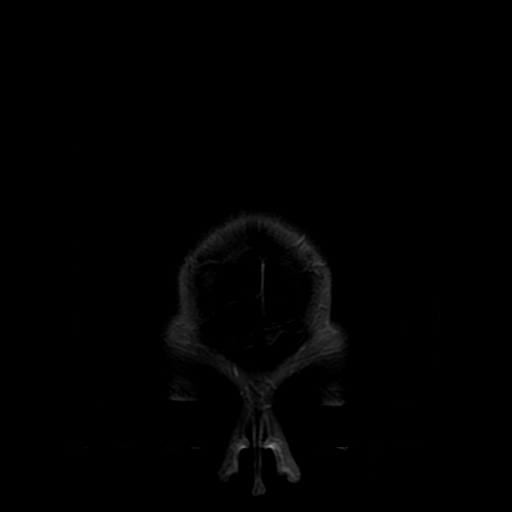

[Series 20: T1 post-contrast · sagittal · 5.0mm · 0.47mm/px · 2 of 23 slices shown (3 of 3)]
[im 1/23]
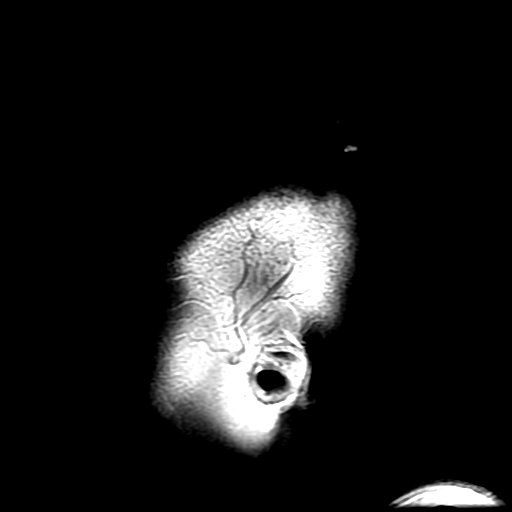
[im 23/23]
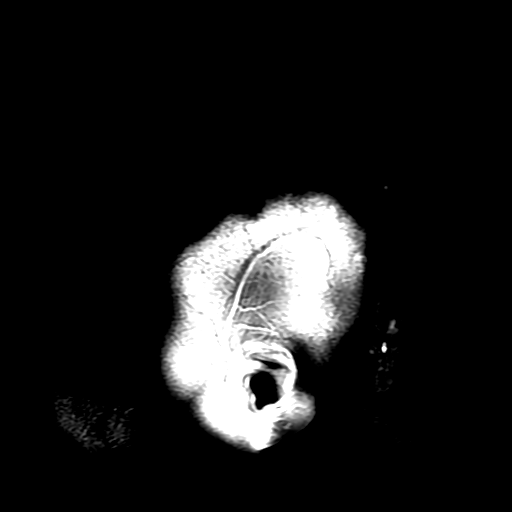

[Series 400: DWI · axial · 3.0mm · 1.09mm/px · z∈[-59,+71]mm · 3 of 45 slices shown (3 of 4)]
[im 1/45]
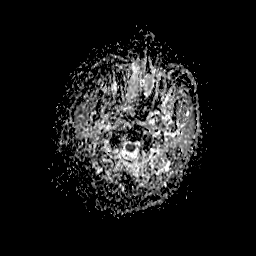
[im 23/45]
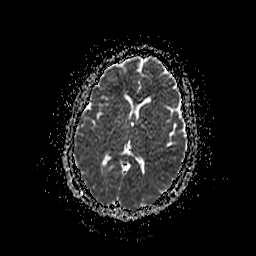
[im 45/45]
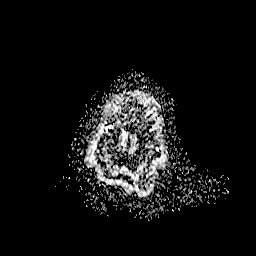

[Series 1600: DWI · coronal · 5.0mm · 1.09mm/px · 2 of 34 slices shown (4 of 4)]
[im 1/34]
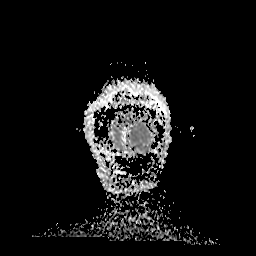
[im 34/34]
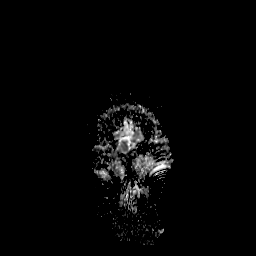

[27 of 48 positions shown; findings below may reference images not displayed]

FINDINGS: MR HEAD FINDINGS

Brain:

The examination is intermittently motion degraded, limiting
evaluation. Most notably, there is severe motion degradation of the
sagittal T1 weighted sequence and moderate motion degradation of the
coronal T2 weighted sequence.

The dominant acute/subacute hemorrhagic venous infarct within the
left frontoparietal lobes has not significantly changed. Unchanged
local mass effect with partial effacement of the left lateral
ventricle.

Restricted diffusion at site of an acute/early subacute hemorrhagic
infarct within the right callosal splenium has subtly increased in
extent.

Persistent although decreased T2/FLAIR hyperintensity within the
bilateral basal ganglia and thalami. Possible new punctate acute
infarct within the left thalamus capsular junction (series 4, image
24).

Bilateral acute/early subacute deep watershed white matter infarcts
otherwise appear similar in distribution and extent.

Persistent trace subarachnoid hemorrhage along the left
frontoparietal convexity.

No midline shift.

Vascular: Expected proximal arterial flow voids.

Skull and upper cervical spine: Within limitations of motion
degradation, no focal marrow lesion is identified.

Sinuses/Orbits: Visualized orbits show no acute finding. Fluid
within the posterior ethmoid air cells and sphenoid sinuses
bilaterally. Right mastoid effusion.

MRV HEAD FINDINGS

Flow within the superior sagittal sinus has significantly improved
since the prior MRV of [DATE]. However, there are persistent
foci of nonocclusive thrombus within this vessel.

Nonocclusive thrombus within the left transverse and proximal
sigmoid dural venous sinuses has increased.

Persistent nonocclusive thrombus within the distal right transverse
sinus. Persistent near occlusive or occlusive thrombus within the
right sigmoid sinus and visualized right internal jugular vein.

The degree of flow within the deep venous system is unchanged.

The veins of [place] and a few additional cerebral veins overlying
the bilateral frontoparietal convexities remain thrombosed (best
appreciated on the axial precontrast T1 weighted sequence).

MRV impression #2 will be called to the ordering clinician or
representative by the Radiologist Assistant, and communication
documented in the PACS or [REDACTED].
IMPRESSION: MRI brain:

1. Motion degraded exam.
2. The dominant acute/subacute left frontoparietal hemorrhagic
venous infarct has not significantly changed from the MRI of
[DATE].
[DATE]. Restricted diffusion at site of an acute/subacute hemorrhagic
infarct within the right callosal splenium has subtly increased in
extent.
4. T2/FLAIR hyperintensity within the deep gray nuclei persists but
has diminished. However, there may be a new punctate acute infarct
within the left thalamocapsular junction.
5. Bilateral acute/subacute deep watershed white matter infarcts
otherwise appear similar to the prior MRI.
6. Persistent trace subarachnoid hemorrhage along the left
frontoparietal convexity.

MRV head:

1. Flow within the superior sagittal sinus has significantly
improved since the prior MRV of [DATE]. However, there are
persistent foci of nonocclusive thrombus within this vessel.
2. Nonocclusive thrombus within the left transverse and proximal
sigmoid dural venous sinuses has increased.
3. Persistent nonocclusive thrombus within the right transverse
sinus.
4. Persistent near occlusive or occlusive thrombus within the right
sigmoid sinus and visualized right internal jugular vein.
5. The degree of flow within the deep venous system is unchanged.
6. The veins of [place] and a few additional cerebral veins
overlying the bilateral frontoparietal convexities remain
thrombosed.

## 2020-02-18 MED ORDER — SODIUM CHLORIDE 0.9 % IV SOLN
3.0000 g | Freq: Four times a day (QID) | INTRAVENOUS | Status: DC
  Administered 2020-02-18 – 2020-02-27 (×39): 3 g via INTRAVENOUS
  Filled 2020-02-18: qty 3
  Filled 2020-02-18: qty 8
  Filled 2020-02-18: qty 0.07
  Filled 2020-02-18 (×4): qty 8
  Filled 2020-02-18 (×2): qty 3
  Filled 2020-02-18 (×2): qty 8
  Filled 2020-02-18: qty 3
  Filled 2020-02-18 (×2): qty 8
  Filled 2020-02-18: qty 3
  Filled 2020-02-18: qty 8
  Filled 2020-02-18: qty 3
  Filled 2020-02-18 (×4): qty 8
  Filled 2020-02-18: qty 3
  Filled 2020-02-18: qty 8
  Filled 2020-02-18: qty 0.07
  Filled 2020-02-18: qty 8
  Filled 2020-02-18 (×3): qty 3
  Filled 2020-02-18 (×3): qty 8
  Filled 2020-02-18 (×4): qty 3
  Filled 2020-02-18 (×4): qty 8
  Filled 2020-02-18: qty 3
  Filled 2020-02-18: qty 8

## 2020-02-18 MED ORDER — BISACODYL 10 MG RE SUPP
10.0000 mg | Freq: Every day | RECTAL | Status: DC | PRN
  Administered 2020-02-18: 10 mg via RECTAL
  Filled 2020-02-18: qty 1

## 2020-02-18 MED ORDER — GADOBUTROL 1 MMOL/ML IV SOLN
7.0000 mL | Freq: Once | INTRAVENOUS | Status: AC | PRN
  Administered 2020-02-18: 7 mL via INTRAVENOUS

## 2020-02-18 MED ORDER — LEVETIRACETAM IN NACL 1000 MG/100ML IV SOLN
1000.0000 mg | Freq: Two times a day (BID) | INTRAVENOUS | Status: DC
  Administered 2020-02-18 – 2020-02-24 (×12): 1000 mg via INTRAVENOUS
  Filled 2020-02-18 (×12): qty 100

## 2020-02-18 NOTE — Progress Notes (Signed)
SLP Cancellation Note  Patient Details Name: Jill Clarke MRN: [id number] DOB: April 26, 1985   Cancelled treatment:       Reason Eval/Treat Not Completed: Fatigue/lethargy limiting ability to participate (Recieved ativan prior to MRI and is lethargic.)  Gabriel Rainwater MA, CCC-SLP   Raenette Sakata Meryl 02/18/2020, 12:29 PM

## 2020-02-18 NOTE — Progress Notes (Signed)
ANTICOAGULATION CONSULT NOTE  Pharmacy Consult:  IV Heparin Indication: Dural venous sinus thrombosis, S/P thrombectomy  Labs: Recent Labs    02/16/20 0419 02/16/20 1525 02/16/20 2231 02/17/20 0400 02/17/20 0500 02/18/20 0351 02/18/20 0535 02/18/20 1327 02/18/20 2130  HGB 7.2*   < > 8.1* 8.2*  --   --  8.0*  --   --   HCT 23.2*   < > 25.7* 26.2*  --   --  24.5*  --   --   PLT 480*  --   --  467*  --   --  483*  --   --   HEPARINUNFRC 0.45  --   --   --    < > 0.30  --  0.24* 0.23*  CREATININE 0.39*  --   --  0.39*  --   --  0.37*  --   --    < > = values in this interval not displayed.   Assessment: 35 yr old female with AMS, found to have a small volume subarachnoid hemorrhage and extensive dural venous thrombosis. Pharmacy was consulted to manage IV heparin per stroke protocol. She is S/P thrombectomy in IR on 1/21 and venogram, complete revascularization, angioplasty in IR on 1/23. Heparin was resumed post-procedure.  Heparin level is sub-therapeutic, essentially unchanged after rate adjustment.  No issue with heparin infusion per RN.  No bleeding reported.  Goal of Therapy:  Heparin level 0.3-0.5 units/ml Monitor platelets by anticoagulation protocol: Yes   Plan:  Increase heparin drip to 1200 units/hr F/u 6hr heparin level Monitor daily CBC, s/sx bleeding  Tanuj Mullens D. Mina Marble, PharmD, BCPS, [place] 02/18/2020, 10:31 PM

## 2020-02-18 NOTE — Progress Notes (Signed)
Pharmacy Antibiotic Note  Jill Clarke is a 35 y.o. female admitted on 02/12/2020 with dural venous thrombosis.  Pharmacy has been consulted to de-escalate to Unasyn for possible mastoiditis.   Renal function stable.  Plan: D/c vancomycin/cefepime Start Unasyn 3g IV q6h Monitor clinical progress, c/s, renal function F/u de-escalation plan/LOT    Height: 5\' 5"  (165.1 cm) Weight: 71 kg (156 lb 8.4 oz) IBW/kg (Calculated) : 57  Temp (24hrs), Avg:98.4 F (36.9 C), Min:98.1 F (36.7 C), Max:98.6 F (37 C)  Recent Labs  Lab 02/14/20 1111 02/15/20 1032 02/15/20 1240 02/15/20 2300 02/16/20 0419 02/17/20 0400 02/17/20 1530 02/18/20 0535  WBC 15.9* 14.0*  --   --  15.2* 13.0*  --  14.4*  CREATININE 0.47 0.42* 0.20* 0.41* 0.39* 0.39*  --  0.37*  VANCOTROUGH  --   --   --   --   --   --  5*  --     Estimated Creatinine Clearance: 97 mL/min (A) (by C-G formula based on SCr of 0.37 mg/dL (L)).    No Known Allergies   Arturo Morton, PharmD, BCPS Please check AMION for all [place] contact numbers Clinical Pharmacist 02/18/2020 10:12 AM

## 2020-02-18 NOTE — Discharge Instructions (Addendum)
Femoral Site Care This sheet gives you information about how to care for yourself after your procedure. Your health care provider may also give you more specific instructions. If you have problems or questions, contact your health care provider. What can I expect after the procedure? After the procedure, it is common to have:  Bruising that usually fades within 1-2 weeks.  Tenderness at the site. Follow these instructions at home: Wound care 1. Follow instructions from your health care provider about how to take care of your insertion site. Make sure you: ? Wash your hands with soap and water before you change your bandage (dressing). If soap and water are not available, use hand sanitizer. ? Change your dressing as directed- pressure dressing removed 24 hours post-procedure (and switch for bandaid), bandaid removed 72 hours post-procedure 2. Do not take baths, swim, or use a hot tub for 7 days post-procedure. 3. You may shower 48 hours after the procedure or as told by your health care provider. ? Gently wash the site with plain soap and water. ? Pat the area dry with a clean towel. ? Do not rub the site. This may cause bleeding. 4. Check your site every day for signs of infection. Check for: ? Redness, swelling, or pain. ? Fluid or blood. ? Warmth. ? Pus or a bad smell. Activity  Do not stoop, bend, or lift anything that is heavier than 10 lb (4.5 kg) for 2 weeks post-procedure.  Do not drive self for 2 weeks post-procedure. Contact a health care provider if you have:  A fever or chills.  You have redness, swelling, or pain around your insertion site. Get help right away if:  The catheter insertion area swells very fast.  You pass out.  You suddenly start to sweat or your skin gets clammy.  The catheter insertion area is bleeding, and the bleeding does not stop when you hold steady pressure on the area.  The area near or just beyond the catheter insertion site becomes  pale, cool, tingly, or numb. These symptoms may represent a serious problem that is an emergency. Do not wait to see if the symptoms will go away. Get medical help right away. Call your local emergency services (911 in the [place] Do not drive yourself to the hospital.  This information is not intended to replace advice given to you by your health care provider. Make sure you discuss any questions you have with your health care provider. Document Revised: 01/22/2017 Document Reviewed: 01/22/2017 Elsevier Patient Education  2020 [employer] 

## 2020-02-18 NOTE — Progress Notes (Signed)
ANTICOAGULATION CONSULT NOTE  Pharmacy Consult:  IV Heparin Indication: Dural venous sinus thrombosis, S/P thrombectomy  Labs: Recent Labs    02/16/20 0419 02/16/20 1525 02/16/20 2231 02/17/20 0400 02/17/20 0500 02/17/20 1821 02/18/20 0351 02/18/20 0535 02/18/20 1327  HGB 7.2*   < > 8.1* 8.2*  --   --   --  8.0*  --   HCT 23.2*   < > 25.7* 26.2*  --   --   --  24.5*  --   PLT 480*  --   --  467*  --   --   --  483*  --   HEPARINUNFRC 0.45  --   --   --    < > <0.10* 0.30  --  0.24*  CREATININE 0.39*  --   --  0.39*  --   --   --  0.37*  --    < > = values in this interval not displayed.   Assessment: 35 yr old female with AMS, found to have a small volume subarachnoid hemorrhage and extensive dural venous thrombosis. Pharmacy was consulted to manage IV heparin per stroke protocol. She is S/P thrombectomy in IR on 1/21 and venogram, complete revascularization, angioplasty in IR on 1/23. Heparin was resumed post-procedure.  Confirmatory heparin level low again at 0.24. CBC stable. No bleeding or issues with infusion per discussion with RN.  Goal of Therapy:  Heparin level 0.3-0.5 units/ml Monitor platelets by anticoagulation protocol: Yes   Plan:  Increase heparin drip to 1050 units/hr F/u 6hr heparin level Monitor daily CBC, s/sx bleeding   Arturo Morton, PharmD, BCPS Please check AMION for all [place] contact numbers Clinical Pharmacist 02/18/2020 2:10 PM

## 2020-02-18 NOTE — Progress Notes (Signed)
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 Estanislado Pandy; with residual thrombus load s/p cerebral arteriogram with mechanical thrombectomy of superior sagittal sinus, straight sinus, vein of galen, bilateral IJs, and right transverse sinus via right (venous) and left (arterial) femoral approach 02/15/2020 by Dr. Estanislado Pandy. Patient laying in bed resting comfortably. She opens eyes to voice and follows simple commands. Demonstrates word finding difficulty but able to identify one word objects (pen, fingers). Can spontaneously move left side and RLE but no spontaneous movements of RUE. Right (venous) and left (arterial) femoral puncture sites c/d/i.   Allergies: Patient has no known allergies.  Medications: Prior to Admission medications   Not on File     Vital Signs: BP (!) 162/83 (BP Location: Left Leg)   Pulse 83   Temp 98.6 F (37 C) (Oral)   Resp 18   Ht '5\' 5"'  (1.651 m)   Wt 156 lb 8.4 oz (71 kg)   LMP 02/03/2020 (Approximate)   SpO2 99%   BMI 26.05 kg/m   Physical Exam Vitals and nursing note reviewed.  Constitutional:      General: She is not in acute distress. Pulmonary:     Effort: Pulmonary effort is normal. No respiratory distress.  Skin:    General: Skin is warm and dry.     Comments: Right (venous) and left (arterial) femoral puncture sites both soft without active bleeding or hematoma.    Neurological:     Mental Status: She is alert.     Comments: Opens eyes to voice and follows simple commands. Demonstrates word finding difficulty but able  to identify one word objects (pen, fingers). PERRL bilaterally. Tongue midline. Can spontaneously move left side and RLE but no spontaneous movements of RUE. Distal pulses (DPs) 2+ bilaterally.     Imaging: DG Abd 1 View  Result Date: 02/14/2020 CLINICAL DATA:  OG tube placement. EXAM: ABDOMEN - 1 VIEW COMPARISON:  None. FINDINGS: Enteric tube tip and side-port project over the stomach. Lung bases are clear. IMPRESSION: Enteric tube tip and side-port project over the stomach. Electronically Signed   By: Lovey Newcomer M.D.   On: 02/14/2020 13:07   CT Head Wo Contrast  Result Date: 02/16/2020 CLINICAL DATA:  Follow-up examination for acute stroke. EXAM: CT HEAD WITHOUT CONTRAST TECHNIQUE: Contiguous axial images were obtained from the base of the skull through the vertex without intravenous contrast. COMPARISON:  Prior CT and MRI from 02/15/2020. FINDINGS: Brain: Continued interval decrease in conspicuity of scattered small volume subarachnoid hemorrhage involving both frontal parietal convexities, now only faintly visible and nearly resolved. No significant basilar cistern hemorrhage now evident. Continued interval evolution of bilateral watershed territory infarcts, most pronounced at the left frontoparietal convexity. Overall size and distribution is relatively similar. No hemorrhagic transformation evident by CT. Previously noted edema involving the deep gray nuclei difficult to visualize by CT, although there are suspected persistent changes. No new hemorrhage or acute ischemic changes. No midline shift or significant mass effect. No hydrocephalus or extra-axial fluid collection. Vascular: Persistent but improved hyperdensity seen about the veins of true large since prior CT. Likewise, the superior sagittal sinus appears less  dense and engorged as compared to previous. Remainder of the dural sinuses also appear less dense. No hyperdense arterial vessel. Skull: Scalp soft tissues demonstrate no new  finding. Calvarium unchanged. Sinuses/Orbits: Globes and orbital soft tissues within normal limits. Extensive mucosal thickening with air-fluid levels noted throughout the paranasal sinuses similar to previous. Trace right mastoid effusion. Other: None. IMPRESSION: 1. Continued interval decrease in conspicuity of scattered small volume subarachnoid hemorrhage involving both frontoparietal convexities, now only faintly visible and nearly resolved. 2. Continued interval evolution of bilateral watershed territory infarcts, most pronounced at the left frontoparietal convexity. Overall size and distribution is relatively similar. No hemorrhagic transformation by CT. 3. Persistent but improved hyperdensity about the veins of [place]rol[place] since prior CT. Likewise, the remainder of the dural sinuses also appear less dense and engorged as compared to previous. 4. No other new acute intracranial abnormality. Electronically Signed   By: Jeannine Boga M.D.   On: 02/16/2020 00:59   CT HEAD WO CONTRAST  Result Date: 02/14/2020 CLINICAL DATA:  Stroke, follow up EXAM: CT HEAD WITHOUT CONTRAST TECHNIQUE: Contiguous axial images were obtained from the base of the skull through the vertex without intravenous contrast. COMPARISON:  02/12/2020. FINDINGS: Brain: Decreased conspicuity of small volume subarachnoid hemorrhage along the bilateral frontoparietal regions within the basilar cistern. No new site of intracranial hemorrhage. Known small bilateral cerebral infarcts are not well demonstrated on this exam. Increased conspicuity of left cerebral edema. No midline shift or mass lesion.  No ventriculomegaly. Vascular: Hyperdense appearance of the cortical veins, posterosuperior sagittal sinus and right transverse sinus is less conspicuous than prior exam. No hyperdense arterial vessels. Skull: Negative for fracture or focal lesion. Sinuses/Orbits: No acute finding. Mild pansinus mucosal thickening with layering secretions. Other:  None. IMPRESSION: Small volume bilateral frontoparietal and basilar cistern subarachnoid hemorrhage is unchanged. No new intracranial hemorrhage. Small bilateral cerebral infarcts are not well demonstrated on this exam. Increased conspicuity of left cerebral edema. Hyperdense appearance of the cortical veins, superior sagittal and right transverse sinus is less conspicuous than prior exam. Electronically Signed   By: Primitivo Gauze M.D.   On: 02/14/2020 09:58   MR BRAIN WO CONTRAST  Result Date: 02/15/2020 CLINICAL DATA:  Follow-up dural sinus thrombosis. Interval heparin drip and mechanical thrombectomy. EXAM: MRI HEAD WITHOUT CONTRAST MRV HEAD WITHOUT CONTRAST TECHNIQUE: Multiplanar, multiecho pulse sequences of the brain and surrounding structures were obtained without intravenous contrast. Angiographic images of the intracranial venous structures were obtained using MRV technique without intravenous contrast. COMPARISON:  Brain MRI from 3 days ago FINDINGS: MRI HEAD WITHOUT CONTRAST Brain: Bilateral deep watershed white matter infarcts which appears similar in extent. The degree of restricted diffusion appears greater in these infarcts, compatible with interval evolution. Venous infarct along the high left central sulcus is progressed in extent. Small infarct in the right paramedian splenium of the corpus callosum with mild increase. T2 signal without restricted diffusion in the right more than left basal ganglia and in the bilateral thalamus, progressed. There is increased FLAIR signal along the cerebral convexities which may be from engorged vessels or the subarachnoid blood seen on admission CT. On gradient imaging there is less vessel congestion suspected. Increased petechial hemorrhage along the left-sided venous infarct. Vascular: T1 hyperintense evolution of the dural venous sinus thrombosis which is most confluent in the superior sagittal sinus, right transverse sinus, and right sigmoid sinus.  Clot is also seen in the bilateral vein of Trolard. Skull and upper cervical spine: Normal marrow signal Sinuses/Orbits: Negative  MR VENOGRAM WITHOUT CONTRAST There has been improvement of venous flow after thrombectomy. Improvements are primarily seen at the level of the torcula and proximal right transverse sinus. There is also now straight sinus and internal cerebral vein flow. Occlusive thrombus is still seen at the level of the superior sagittal sinus, distal right transverse, and right sigmoid sinuses. Veins of [place]rolard remain thrombosed, best seen by noncontrast T1 weighted imaging. IMPRESSION: Brain MRI: 1. Progressive left perirolandic venous infarction and petechial hemorrhage. 2. New edema in the deep gray nuclei without infarction. Intracranial MRV: 1. Improved flow especially in the deep venous system. Improved flow also seen in the proximal right transverse sinus. 2. Occlusive clot still seen in the superior sagittal, distal right transverse, and right sigmoid dural sinuses. No recanalization of the veins of Trolard. Electronically Signed   By: Monte Fantasia M.D.   On: 02/15/2020 05:10   MR Venogram Head  Result Date: 02/15/2020 CLINICAL DATA:  Follow-up dural sinus thrombosis. Interval heparin drip and mechanical thrombectomy. EXAM: MRI HEAD WITHOUT CONTRAST MRV HEAD WITHOUT CONTRAST TECHNIQUE: Multiplanar, multiecho pulse sequences of the brain and surrounding structures were obtained without intravenous contrast. Angiographic images of the intracranial venous structures were obtained using MRV technique without intravenous contrast. COMPARISON:  Brain MRI from 3 days ago FINDINGS: MRI HEAD WITHOUT CONTRAST Brain: Bilateral deep watershed white matter infarcts which appears similar in extent. The degree of restricted diffusion appears greater in these infarcts, compatible with interval evolution. Venous infarct along the high left central sulcus is progressed in extent. Small infarct in the  right paramedian splenium of the corpus callosum with mild increase. T2 signal without restricted diffusion in the right more than left basal ganglia and in the bilateral thalamus, progressed. There is increased FLAIR signal along the cerebral convexities which may be from engorged vessels or the subarachnoid blood seen on admission CT. On gradient imaging there is less vessel congestion suspected. Increased petechial hemorrhage along the left-sided venous infarct. Vascular: T1 hyperintense evolution of the dural venous sinus thrombosis which is most confluent in the superior sagittal sinus, right transverse sinus, and right sigmoid sinus. Clot is also seen in the bilateral vein of Trolard. Skull and upper cervical spine: Normal marrow signal Sinuses/Orbits: Negative MR VENOGRAM WITHOUT CONTRAST There has been improvement of venous flow after thrombectomy. Improvements are primarily seen at the level of the torcula and proximal right transverse sinus. There is also now straight sinus and internal cerebral vein flow. Occlusive thrombus is still seen at the level of the superior sagittal sinus, distal right transverse, and right sigmoid sinuses. Veins of Trolard remain thrombosed, best seen by noncontrast T1 weighted imaging. IMPRESSION: Brain MRI: 1. Progressive left perirolandic venous infarction and petechial hemorrhage. 2. New edema in the deep gray nuclei without infarction. Intracranial MRV: 1. Improved flow especially in the deep venous system. Improved flow also seen in the proximal right transverse sinus. 2. Occlusive clot still seen in the superior sagittal, distal right transverse, and right sigmoid dural sinuses. No recanalization of the veins of Trolard. Electronically Signed   By: Monte Fantasia M.D.   On: 02/15/2020 05:10   IR THROMBECT VENO MECH REPT MOD SED  Result Date: 02/18/2020 INDICATION: Stuporous.  Dural venous sinus thrombosis. EXAM: 1. EMERGENT LARGE VESSEL OCCLUSION THROMBOLYSIS  (POSTERIOR CIRCULATION) COMPARISON:  MRI MRV of February 15, 2020. MEDICATIONS: Ancef 2 g IV antibiotic was administered within 1 hour of the procedure. ANESTHESIA/SEDATION: General anesthesia CONTRAST:  Isovue 300 100 mL FLUOROSCOPY TIME:  Fluoroscopy Time: 96 minutes 12 seconds (3924 mGy). COMPLICATIONS: None immediate. TECHNIQUE: Following a full explanation of the procedure along with the potential associated complications, an informed witnessed consent was obtained. The risks of intracranial hemorrhage of 10%, worsening neurological deficit, ventilator dependency, death and inability to revascularize were all reviewed in detail with the patient's mother. The patient was then put under general anesthesia by the Department of Anesthesiology at [facility]. The in situ right common femoral vein 8 [place], and in situ left common femoral arterial French sheath were exchanged over a 0.035 inch guidewire for a new sheath which were then connected to continuous heparinized saline infusion. Over a 0.035 inch Roadrunner guidewire, a 5 [place] JB 1 catheter was advanced to the aortic arch region and positioned in the right common carotid artery. Through the right common femoral vein, over a 0.035 inch Roadrunner guidewire, a 5 French catheter was administered to the right internal jugular vein to the skull base. This in turn was then exchanged over a 0.035 inch 300 cm Constance Holster exchange guidewire for an 8 French 80 cm Neuron Max sheath. The guidewire was removed. Good aspiration obtained from the hub of the Neuron Max sheath. A gentle control arteriogram performed through this demonstrates safe position of the tip of the Neuron Max sheath. This was then connected to continuous heparinized saline infusion. FINDINGS: The right common carotid arteriogram demonstrates the right external carotid artery and its major branches to be widely patent. The right internal carotid artery at the bulb to the cranial skull base is  widely patent. The petrous, cavernous and supraclinoid segments are also widely patent. The right middle cerebral artery and the right anterior cerebral opacify into the capillary and venous phases. The venous phase demonstrates again significantly decreased venous congestion overlying both cerebral convexities. Patency is noted of the right transverse sinus to the right transverse sinus sigmoid sinus junction. This vein of Labbe and the posterior temporal vein are patent draining into venous channel running parallel to the to the occluded right internal jugular vein. Patency is maintained at the torcula, the left transverse sinus, the left sigmoid sinus and the left internal jugular vein. PROCEDURE: An 071 95 cm Benchmark catheter was advanced through the Neuron Max guide sheath to the left internal jugular occluded vein. An 035 inch Roadrunner wire was first advanced without difficulty into the right sigmoid sinus and the distal right transverse sinus. However, advancement of the 6 [place] Benchmark was met with significant resistance. At this time, in a coaxial manner and with constant heparinized saline infusion over a 016 inch Fathom micro guidewire, a Marksman 027 microcatheter was advanced without difficulty to the torcula. The guidewire was removed. Control arteriogram was then performed from the proximal right transverse sinus. This confirmed brisk flow through the right transverse and left transverse sinuses. This also demonstrated the partially occluded on the torcula. The 016 micro guidewire was reintroduced, and the torque device was advanced without difficulty into the distal 1/3 of the superior sagittal sinus and eventually into the junction of the anterior and the middle 1/3. Micro guidewire was removed. Good aspiration was obtained from the hub of the microcatheter. A gentle control arteriogram performed through this demonstrated extensive occlusion of the anterior 2/3 of the superior sagittal sinus.  Through the microcatheter, a 6 mm x 40 mm Solitaire X retrieval device was advanced and deployed through the microcatheter. The microcatheter was then gently retrieved. The 6 [place] Benchmark guide catheter was then advanced to just  inside the torcula having the hub at the right groin. At this time as constant aspiration was applied through the Benchmark, the retrieval device and the microcatheter were retrieved and removed. Copious amounts of clot were seen in the canister, and also in the retrieval device. Three further passes were made with a similar arrangement resulting in more clot being removed. A final pass was then made through a 071 136 cm Zoom aspiration catheter which replaced the Benchmark catheter. This could be advanced more distally to the anterior third of the superior sagittal sinus. Constant aspiration was applied as the aspiration catheter was retrieved slowly more proximally and into the right transverse sinus. Again more clot was obtained especially in the canister. A control arteriogram performed through the diagnostic catheter in the right common carotid artery into the venous phase now demonstrated complete recanalization of the superior sagittal sinus, the right transverse sinus, the left transverse sinus, the left sigmoid sinus and the left internal jugular vein. Gentle control injection performed through the Zoom aspiration catheter in the mid right transverse sinus demonstrated significant stenosis of the right transverse sinus sigmoid sinus junction. Also noted was severely contracted right sigmoid sinus to the internal jugular vein. At this time, a 8 mm x 40 mm sterling balloon angioplasty catheter was advanced over a 0.014 inch exchange micro guidewire. Balloon angioplasty was then performed extending from the distal 1/3 of the transverse sinus on the right side, the transverse sinus sigmoid sinus junction, and the sigmoid sinus. At the end of this, a control arteriogram performed in  the venous phase demonstrated marginally improved flow through the right sigmoid sinus into the now patent right internal jugular vein. However, there continued be prominent collateral just lateral to this at the level of the transverse sinus sigmoid sinus junction draining the posterior temporal vein, and the vein of Labbe. There continued be stenosis at the right transverse sinus sigmoid sinus junction partially improved. As venous grafts continued through superior sagittal sinus and transverse sinuses and the right sigmoid sinus and through a prominent collateral just into the right sigmoid sinus, it was elected to stop. 071 Zoom aspiration catheter, and the Neuron Max sheath were removed with hemostasis achieved at the right groin venous puncture site. The left femoral sheath was then removed and hemostasis achieved with a 5 [place] ExoSeal closure device. Distal pulses remained palpable. A CT of the brain demonstrated no gross subarachnoid hemorrhage or mass effect or midline shift. The patient was left intubated and transferred to the neuro ICU. IMPRESSION: Status post endovascular complete revascularization of entire superior sagittal sinus, the right transverse sinus, the left transverse sinus, the left sigmoid sinus, the left internal jugular vein, and partially the right sigmoid sinus and right internal jugular vein using the aspiration, and stent retrieval technique as described. PLAN: Follow-up in the clinic 4 to 6 weeks post discharge. Electronically Signed   By: Luanne Bras M.D.   On: 02/17/2020 13:36   IR CT Head Ltd  Result Date: 02/18/2020 INDICATION: Stuporous.  Dural venous sinus thrombosis. EXAM: 1. EMERGENT LARGE VESSEL OCCLUSION THROMBOLYSIS (POSTERIOR CIRCULATION) COMPARISON:  MRI MRV of February 15, 2020. MEDICATIONS: Ancef 2 g IV antibiotic was administered within 1 hour of the procedure. ANESTHESIA/SEDATION: General anesthesia CONTRAST:  Isovue 300 100 mL FLUOROSCOPY TIME:   Fluoroscopy Time: 96 minutes 12 seconds (3924 mGy). COMPLICATIONS: None immediate. TECHNIQUE: Following a full explanation of the procedure along with the potential associated complications, an informed witnessed consent was obtained. The risks  of intracranial hemorrhage of 10%, worsening neurological deficit, ventilator dependency, death and inability to revascularize were all reviewed in detail with the patient's mother. The patient was then put under general anesthesia by the Department of Anesthesiology at [facility]. The in situ right common femoral vein 8 [place], and in situ left common femoral arterial French sheath were exchanged over a 0.035 inch guidewire for a new sheath which were then connected to continuous heparinized saline infusion. Over a 0.035 inch Roadrunner guidewire, a 5 [place] JB 1 catheter was advanced to the aortic arch region and positioned in the right common carotid artery. Through the right common femoral vein, over a 0.035 inch Roadrunner guidewire, a 5 French catheter was administered to the right internal jugular vein to the skull base. This in turn was then exchanged over a 0.035 inch 300 cm Constance Holster exchange guidewire for an 8 French 80 cm Neuron Max sheath. The guidewire was removed. Good aspiration obtained from the hub of the Neuron Max sheath. A gentle control arteriogram performed through this demonstrates safe position of the tip of the Neuron Max sheath. This was then connected to continuous heparinized saline infusion. FINDINGS: The right common carotid arteriogram demonstrates the right external carotid artery and its major branches to be widely patent. The right internal carotid artery at the bulb to the cranial skull base is widely patent. The petrous, cavernous and supraclinoid segments are also widely patent. The right middle cerebral artery and the right anterior cerebral opacify into the capillary and venous phases. The venous phase demonstrates again  significantly decreased venous congestion overlying both cerebral convexities. Patency is noted of the right transverse sinus to the right transverse sinus sigmoid sinus junction. This vein of Labbe and the posterior temporal vein are patent draining into venous channel running parallel to the to the occluded right internal jugular vein. Patency is maintained at the torcula, the left transverse sinus, the left sigmoid sinus and the left internal jugular vein. PROCEDURE: An 071 95 cm Benchmark catheter was advanced through the Neuron Max guide sheath to the left internal jugular occluded vein. An 035 inch Roadrunner wire was first advanced without difficulty into the right sigmoid sinus and the distal right transverse sinus. However, advancement of the 6 [place] Benchmark was met with significant resistance. At this time, in a coaxial manner and with constant heparinized saline infusion over a 016 inch Fathom micro guidewire, a Marksman 027 microcatheter was advanced without difficulty to the torcula. The guidewire was removed. Control arteriogram was then performed from the proximal right transverse sinus. This confirmed brisk flow through the right transverse and left transverse sinuses. This also demonstrated the partially occluded on the torcula. The 016 micro guidewire was reintroduced, and the torque device was advanced without difficulty into the distal 1/3 of the superior sagittal sinus and eventually into the junction of the anterior and the middle 1/3. Micro guidewire was removed. Good aspiration was obtained from the hub of the microcatheter. A gentle control arteriogram performed through this demonstrated extensive occlusion of the anterior 2/3 of the superior sagittal sinus. Through the microcatheter, a 6 mm x 40 mm Solitaire X retrieval device was advanced and deployed through the microcatheter. The microcatheter was then gently retrieved. The 6 [place] Benchmark guide catheter was then advanced to just  inside the torcula having the hub at the right groin. At this time as constant aspiration was applied through the Benchmark, the retrieval device and the microcatheter were retrieved and removed. Copious amounts  of clot were seen in the canister, and also in the retrieval device. Three further passes were made with a similar arrangement resulting in more clot being removed. A final pass was then made through a 071 136 cm Zoom aspiration catheter which replaced the Benchmark catheter. This could be advanced more distally to the anterior third of the superior sagittal sinus. Constant aspiration was applied as the aspiration catheter was retrieved slowly more proximally and into the right transverse sinus. Again more clot was obtained especially in the canister. A control arteriogram performed through the diagnostic catheter in the right common carotid artery into the venous phase now demonstrated complete recanalization of the superior sagittal sinus, the right transverse sinus, the left transverse sinus, the left sigmoid sinus and the left internal jugular vein. Gentle control injection performed through the Zoom aspiration catheter in the mid right transverse sinus demonstrated significant stenosis of the right transverse sinus sigmoid sinus junction. Also noted was severely contracted right sigmoid sinus to the internal jugular vein. At this time, a 8 mm x 40 mm sterling balloon angioplasty catheter was advanced over a 0.014 inch exchange micro guidewire. Balloon angioplasty was then performed extending from the distal 1/3 of the transverse sinus on the right side, the transverse sinus sigmoid sinus junction, and the sigmoid sinus. At the end of this, a control arteriogram performed in the venous phase demonstrated marginally improved flow through the right sigmoid sinus into the now patent right internal jugular vein. However, there continued be prominent collateral just lateral to this at the level of the  transverse sinus sigmoid sinus junction draining the posterior temporal vein, and the vein of Labbe. There continued be stenosis at the right transverse sinus sigmoid sinus junction partially improved. As venous grafts continued through superior sagittal sinus and transverse sinuses and the right sigmoid sinus and through a prominent collateral just into the right sigmoid sinus, it was elected to stop. 071 Zoom aspiration catheter, and the Neuron Max sheath were removed with hemostasis achieved at the right groin venous puncture site. The left femoral sheath was then removed and hemostasis achieved with a 5 [place] ExoSeal closure device. Distal pulses remained palpable. A CT of the brain demonstrated no gross subarachnoid hemorrhage or mass effect or midline shift. The patient was left intubated and transferred to the neuro ICU. IMPRESSION: Status post endovascular complete revascularization of entire superior sagittal sinus, the right transverse sinus, the left transverse sinus, the left sigmoid sinus, the left internal jugular vein, and partially the right sigmoid sinus and right internal jugular vein using the aspiration, and stent retrieval technique as described. PLAN: Follow-up in the clinic 4 to 6 weeks post discharge. Electronically Signed   By: Luanne Bras M.D.   On: 02/17/2020 13:36   DG Chest Port 1 View  Result Date: 02/18/2020 CLINICAL DATA:  Abnormal respirations. EXAM: PORTABLE CHEST 1 VIEW COMPARISON:  02/14/2020. FINDINGS: Interim extubation of NG tube. Right PICC line noted with tip over cavoatrial junction. Borderline cardiomegaly. Diffuse bilateral pulmonary infiltrates/edema. No pleural effusion or pneumothorax. IMPRESSION: 1. Interim extubation and removal of NG tube. Right PICC line noted with tip over cavoatrial junction. 2. Borderline cardiomegaly. 3. Diffuse bilateral pulmonary infiltrates/edema noted on today's exam. Electronically Signed   By: Marcello Moores  Register   On: 02/18/2020  05:36   DG CHEST PORT 1 VIEW  Result Date: 02/14/2020 CLINICAL DATA:  Endotracheal tube placement. EXAM: PORTABLE CHEST 1 VIEW COMPARISON:  One day prior FINDINGS: Endotracheal tube terminates 3.9 cm  above carina. Nasogastric tube extends beyond the inferior aspect of the film. Right-sided PICC line is difficult to follow centrally. Most likely with tip at cavoatrial junction. Midline trachea. Normal heart size. No pleural effusion or pneumothorax. Improved left lower lobe aeration with mild bibasilar atelectasis remaining. IMPRESSION: Appropriate position of endotracheal tube. Bibasilar atelectasis with significantly improved left lower lobe aeration. Electronically Signed   By: Abigail Miyamoto M.D.   On: 02/14/2020 13:08   EEG adult  Result Date: 02/16/2020 Lora Havens, MD     02/16/2020 10:46 AM Patient Name: Jill Clarke MRN: [id number] Epilepsy Attending: Lora Havens Referring Physician/Provider: Dr Rosalin Hawking Date: 02/16/2020 Duration: 24.48 mins Patient history: 35 year old female with altered mental status.  EEG to evaluate for seizures. Level of alertness: Comatose AEDs during EEG study: LEV Technical aspects: This EEG study was done with scalp electrodes positioned according to the 10-20 International system of electrode placement. Electrical activity was acquired at a sampling rate of '500Hz'  and reviewed with a high frequency filter of '70Hz'  and a low frequency filter of '1Hz' . EEG data were recorded continuously and digitally stored. Description: EEG showed continuous polymorphic 3 to 6 Hz theta-delta slowing. Hperventilation and photic stimulation were not performed.   ABNORMALITY -Continuous slow, generalized IMPRESSION: This study is suggestive of moderate to severe diffuse encephalopathy, nonspecific etiology.  No seizures or epileptiform discharges were seen throughout the recording. Lora Havens   ECHOCARDIOGRAM COMPLETE  Result Date: 02/16/2020    ECHOCARDIOGRAM REPORT    Patient Name:   DORETHIA JEANMARIE Date of Exam: 02/16/2020 Medical Rec #:  [id number]          Height:       65.0 in Accession #:    [id number]         Weight:       153.9 lb Date of Birth:  1985-03-23         BSA:          1.770 m Patient Age:    34 years           BP:           99/80 mmHg Patient Gender: F                  HR:           96 bpm. Exam Location:  Inpatient Procedure: 2D Echo Indications:    stroke 434.91  History:        Patient has no prior history of Echocardiogram examinations.  Sonographer:    Johny Chess Referring Phys: [place]  1. Left ventricular ejection fraction, by estimation, is 55 to 60%. The left ventricle has normal function. The left ventricle has no regional wall motion abnormalities. Left ventricular diastolic parameters were normal.  2. Right ventricular systolic function is normal. The right ventricular size is normal. There is normal pulmonary artery systolic pressure.  3. Mild mitral valve leaflet thickening and calcification. Trivial mitral valve regurgitation.  4. The aortic valve is tricuspid. There is mild thickening of the aortic valve. Aortic valve regurgitation is not visualized. No aortic stenosis is present.  5. The inferior vena cava is normal in size with <50% respiratory variability, suggesting right atrial pressure of 8 mmHg. Conclusion(s)/Recommendation(s): No intracardiac source of embolism detected on this transthoracic study. A transesophageal echocardiogram is recommended to exclude cardiac source of embolism if clinically indicated. FINDINGS  Left Ventricle: Left ventricular ejection fraction, by estimation, is  55 to 60%. The left ventricle has normal function. The left ventricle has no regional wall motion abnormalities. The left ventricular internal cavity size was normal in size. There is  no left ventricular hypertrophy. Left ventricular diastolic parameters were normal. Right Ventricle: The right ventricular size is normal.  Right vetricular wall thickness was not well visualized. Right ventricular systolic function is normal. There is normal pulmonary artery systolic pressure. The tricuspid regurgitant velocity is 2.06 m/s, and with an assumed right atrial pressure of 3 mmHg, the estimated right ventricular systolic pressure is 08.1 mmHg. Left Atrium: Left atrial size was normal in size. Right Atrium: Right atrial size was normal in size. Pericardium: There is no evidence of pericardial effusion. Mitral Valve: The mitral valve is abnormal. There is mild thickening of the mitral valve leaflet(s). There is mild calcification of the mitral valve leaflet(s). Trivial mitral valve regurgitation. Tricuspid Valve: The tricuspid valve is normal in structure. Tricuspid valve regurgitation is trivial. Aortic Valve: The aortic valve is tricuspid. There is mild thickening of the aortic valve. Aortic valve regurgitation is not visualized. No aortic stenosis is present. Pulmonic Valve: The pulmonic valve was normal in structure. Pulmonic valve regurgitation is trivial. Aorta: The aortic root and ascending aorta are structurally normal, with no evidence of dilitation. Venous: The inferior vena cava is normal in size with less than 50% respiratory variability, suggesting right atrial pressure of 8 mmHg. IAS/Shunts: No atrial level shunt detected by color flow Doppler.  LEFT VENTRICLE PLAX 2D LVIDd:         4.40 cm  Diastology LVIDs:         3.20 cm  LV e' medial:    12.70 cm/s LV PW:         1.00 cm  LV E/e' medial:  7.3 LV IVS:        0.80 cm  LV e' lateral:   15.60 cm/s LVOT diam:     1.90 cm  LV E/e' lateral: 6.0 LV SV:         50 LV SV Index:   28 LVOT Area:     2.84 cm  RIGHT VENTRICLE             IVC RV S prime:     15.70 cm/s  IVC diam: 1.90 cm TAPSE (M-mode): 2.3 cm LEFT ATRIUM             Index       RIGHT ATRIUM           Index LA diam:        3.00 cm 1.70 cm/m  RA Area:     11.90 cm LA Vol (A2C):   32.6 ml 18.42 ml/m RA Volume:   27.70  ml  15.65 ml/m LA Vol (A4C):   32.5 ml 18.37 ml/m LA Biplane Vol: 34.1 ml 19.27 ml/m  AORTIC VALVE LVOT Vmax:   97.30 cm/s LVOT Vmean:  73.400 cm/s LVOT VTI:    0.177 m  AORTA Ao Root diam: 2.50 cm Ao Asc diam:  2.30 cm MITRAL VALVE               TRICUSPID VALVE MV Area (PHT): 4.31 cm    TR Peak grad:   17.0 mmHg MV Decel Time: 176 msec    TR Vmax:        206.00 cm/s MV E velocity: 93.00 cm/s MV A velocity: 81.80 cm/s  SHUNTS MV E/A ratio:  1.14  Systemic VTI:  0.18 m                            Systemic Diam: 1.90 cm Gwyndolyn Kaufman MD Electronically signed by Gwyndolyn Kaufman MD Signature Date/Time: 02/16/2020/2:49:18 PM    Final    IR ANGIO INTRA EXTRACRAN SEL INTERNAL CAROTID UNI R MOD SED  Result Date: 02/18/2020 INDICATION: Stuporous.  Dural venous sinus thrombosis. EXAM: 1. EMERGENT LARGE VESSEL OCCLUSION THROMBOLYSIS (POSTERIOR CIRCULATION) COMPARISON:  MRI MRV of February 15, 2020. MEDICATIONS: Ancef 2 g IV antibiotic was administered within 1 hour of the procedure. ANESTHESIA/SEDATION: General anesthesia CONTRAST:  Isovue 300 100 mL FLUOROSCOPY TIME:  Fluoroscopy Time: 96 minutes 12 seconds (3924 mGy). COMPLICATIONS: None immediate. TECHNIQUE: Following a full explanation of the procedure along with the potential associated complications, an informed witnessed consent was obtained. The risks of intracranial hemorrhage of 10%, worsening neurological deficit, ventilator dependency, death and inability to revascularize were all reviewed in detail with the patient's mother. The patient was then put under general anesthesia by the Department of Anesthesiology at [facility]. The in situ right common femoral vein 8 [place], and in situ left common femoral arterial French sheath were exchanged over a 0.035 inch guidewire for a new sheath which were then connected to continuous heparinized saline infusion. Over a 0.035 inch Roadrunner guidewire, a 5 [place] JB 1 catheter was advanced to the  aortic arch region and positioned in the right common carotid artery. Through the right common femoral vein, over a 0.035 inch Roadrunner guidewire, a 5 French catheter was administered to the right internal jugular vein to the skull base. This in turn was then exchanged over a 0.035 inch 300 cm Constance Holster exchange guidewire for an 8 French 80 cm Neuron Max sheath. The guidewire was removed. Good aspiration obtained from the hub of the Neuron Max sheath. A gentle control arteriogram performed through this demonstrates safe position of the tip of the Neuron Max sheath. This was then connected to continuous heparinized saline infusion. FINDINGS: The right common carotid arteriogram demonstrates the right external carotid artery and its major branches to be widely patent. The right internal carotid artery at the bulb to the cranial skull base is widely patent. The petrous, cavernous and supraclinoid segments are also widely patent. The right middle cerebral artery and the right anterior cerebral opacify into the capillary and venous phases. The venous phase demonstrates again significantly decreased venous congestion overlying both cerebral convexities. Patency is noted of the right transverse sinus to the right transverse sinus sigmoid sinus junction. This vein of Labbe and the posterior temporal vein are patent draining into venous channel running parallel to the to the occluded right internal jugular vein. Patency is maintained at the torcula, the left transverse sinus, the left sigmoid sinus and the left internal jugular vein. PROCEDURE: An 071 95 cm Benchmark catheter was advanced through the Neuron Max guide sheath to the left internal jugular occluded vein. An 035 inch Roadrunner wire was first advanced without difficulty into the right sigmoid sinus and the distal right transverse sinus. However, advancement of the 6 [place] Benchmark was met with significant resistance. At this time, in a coaxial manner and with  constant heparinized saline infusion over a 016 inch Fathom micro guidewire, a Marksman 027 microcatheter was advanced without difficulty to the torcula. The guidewire was removed. Control arteriogram was then performed from the proximal right transverse sinus. This confirmed brisk flow through  the right transverse and left transverse sinuses. This also demonstrated the partially occluded on the torcula. The 016 micro guidewire was reintroduced, and the torque device was advanced without difficulty into the distal 1/3 of the superior sagittal sinus and eventually into the junction of the anterior and the middle 1/3. Micro guidewire was removed. Good aspiration was obtained from the hub of the microcatheter. A gentle control arteriogram performed through this demonstrated extensive occlusion of the anterior 2/3 of the superior sagittal sinus. Through the microcatheter, a 6 mm x 40 mm Solitaire X retrieval device was advanced and deployed through the microcatheter. The microcatheter was then gently retrieved. The 6 [place] Benchmark guide catheter was then advanced to just inside the torcula having the hub at the right groin. At this time as constant aspiration was applied through the Benchmark, the retrieval device and the microcatheter were retrieved and removed. Copious amounts of clot were seen in the canister, and also in the retrieval device. Three further passes were made with a similar arrangement resulting in more clot being removed. A final pass was then made through a 071 136 cm Zoom aspiration catheter which replaced the Benchmark catheter. This could be advanced more distally to the anterior third of the superior sagittal sinus. Constant aspiration was applied as the aspiration catheter was retrieved slowly more proximally and into the right transverse sinus. Again more clot was obtained especially in the canister. A control arteriogram performed through the diagnostic catheter in the right common carotid  artery into the venous phase now demonstrated complete recanalization of the superior sagittal sinus, the right transverse sinus, the left transverse sinus, the left sigmoid sinus and the left internal jugular vein. Gentle control injection performed through the Zoom aspiration catheter in the mid right transverse sinus demonstrated significant stenosis of the right transverse sinus sigmoid sinus junction. Also noted was severely contracted right sigmoid sinus to the internal jugular vein. At this time, a 8 mm x 40 mm sterling balloon angioplasty catheter was advanced over a 0.014 inch exchange micro guidewire. Balloon angioplasty was then performed extending from the distal 1/3 of the transverse sinus on the right side, the transverse sinus sigmoid sinus junction, and the sigmoid sinus. At the end of this, a control arteriogram performed in the venous phase demonstrated marginally improved flow through the right sigmoid sinus into the now patent right internal jugular vein. However, there continued be prominent collateral just lateral to this at the level of the transverse sinus sigmoid sinus junction draining the posterior temporal vein, and the vein of Labbe. There continued be stenosis at the right transverse sinus sigmoid sinus junction partially improved. As venous grafts continued through superior sagittal sinus and transverse sinuses and the right sigmoid sinus and through a prominent collateral just into the right sigmoid sinus, it was elected to stop. 071 Zoom aspiration catheter, and the Neuron Max sheath were removed with hemostasis achieved at the right groin venous puncture site. The left femoral sheath was then removed and hemostasis achieved with a 5 [place] ExoSeal closure device. Distal pulses remained palpable. A CT of the brain demonstrated no gross subarachnoid hemorrhage or mass effect or midline shift. The patient was left intubated and transferred to the neuro ICU. IMPRESSION: Status post  endovascular complete revascularization of entire superior sagittal sinus, the right transverse sinus, the left transverse sinus, the left sigmoid sinus, the left internal jugular vein, and partially the right sigmoid sinus and right internal jugular vein using the aspiration, and stent retrieval  technique as described. PLAN: Follow-up in the clinic 4 to 6 weeks post discharge. Electronically Signed   By: Luanne Bras M.D.   On: 02/17/2020 13:36    Labs:  CBC: Recent Labs    02/15/20 1032 02/15/20 1240 02/16/20 0419 02/16/20 1525 02/16/20 2231 02/17/20 0400 02/18/20 0535  WBC 14.0*  --  15.2*  --   --  13.0* 14.4*  HGB 8.2*   < > 7.2* 7.0* 8.1* 8.2* 8.0*  HCT 26.6*   < > 23.2* 22.7* 25.7* 26.2* 24.5*  PLT 536*  --  480*  --   --  467* 483*   < > = values in this interval not displayed.    COAGS: Recent Labs    02/12/20 0417  INR 1.2    BMP: Recent Labs    02/15/20 2300 02/16/20 0419 02/16/20 1006 02/17/20 0400 02/17/20 0900 02/17/20 1600 02/17/20 2200 02/18/20 0535  NA 150* 150*   < > 151* 149* 149* 145 145  K 3.1* 3.6  --  3.1*  --   --   --  3.8  CL 116* 118*  --  117*  --   --   --  111  CO2 26 27  --  25  --   --   --  28  GLUCOSE 138* 131*  --  207*  --   --   --  111*  BUN 5* 6  --  5*  --   --   --  5*  CALCIUM 8.7* 8.5*  --  8.7*  --   --   --  8.8*  CREATININE 0.41* 0.39*  --  0.39*  --   --   --  0.37*  GFRNONAA >60 >60  --  >60  --   --   --  >60   < > = values in this interval not displayed.    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 Estanislado Pandy; with residual thrombus load s/p cerebral arteriogram with mechanical thrombectomy of superior sagittal sinus, straight sinus, vein of galen, bilateral IJs, and right transverse sinus via right (venous) and left (arterial) femoral approach 02/15/2020 by Dr. Estanislado Pandy. Patient's condition improving- extubated and awake, follows simple commands, demonstrates word finding difficulty but able to identify one word objects (pen, fingers), can spontaneously move left side and RLE but no spontaneous movements of RUE. Right (venous) and left (arterial) femoral puncture sites stable, distal pulses stable. Further plans per neurology/CCM- appreciate and agree with management. NIR to follow.   Electronically Signed: Earley Abide, PA-C 02/18/2020, 9:27 AM   I spent a total of 25 Minutes at the the patient's bedside AND on the patient's hospital floor or unit, greater than 50% of which was counseling/coordinating care for dural venous sinus thrombosis s/p revascularization.

## 2020-02-18 NOTE — Progress Notes (Signed)
ANTICOAGULATION CONSULT NOTE  Pharmacy Consult:  IV Heparin Indication: Dural venous sinus thrombosis, S/P thrombectomy  Labs: Recent Labs    02/15/20 1032 02/15/20 1240 02/15/20 2300 02/16/20 0419 02/16/20 1525 02/16/20 2231 02/17/20 0400 02/17/20 0500 02/17/20 0900 02/17/20 1821 02/18/20 0351  HGB 8.2*   < >  --  7.2* 7.0* 8.1* 8.2*  --   --   --   --   HCT 26.6*   < >  --  23.2* 22.7* 25.7* 26.2*  --   --   --   --   PLT 536*  --   --  480*  --   --  467*  --   --   --   --   HEPARINUNFRC 0.15*   < > 0.83* 0.45  --   --   --    < > <0.10* <0.10* 0.30  CREATININE 0.42*   < > 0.41* 0.39*  --   --  0.39*  --   --   --   --    < > = values in this interval not displayed.   Assessment: 35 yr old female with AMS, found to have a small volume subarachnoid hemorrhage and extensive dural venous thrombosis.  Pharmacy was consulted to manage IV heparin per stroke protocol. She is S/P thrombectomy in IR on 1/21 and venogram, complete revascularization, angioplasty in IR on 1/23. Heparin was resumed post-procedure.  Heparin level this am 0.30 units/ml   Goal of Therapy:  Heparin level 0.3-0.5 units/ml Monitor platelets by anticoagulation protocol: Yes   Plan:  Continue heparin drip at 900 units/hr Monitor daily heparin level, CBC, s/sx bleeding  Thanks for allowing pharmacy to be a part of this patient's care.  Excell Seltzer, PharmD Clinical Pharmacist  02/18/2020 5:03 AM

## 2020-02-18 NOTE — Progress Notes (Signed)
NAME:  Jill Clarke, MRN:  [id number], DOB:  Dec 27, 1985, LOS: 6 ADMISSION DATE:  02/12/2020, CONSULTATION DATE:  02/13/2020 REFERRING MD:  Leonel Ramsay CHIEF COMPLAINT:  AMS   Brief History:  51 yoF presented with AMS found to have a small volume SAH and dural venous thrombosis with deep watershed white matter infarcts. PCCM consulted for possible post-op intubation/ventilation management.  History of Present Illness:  25 yoF initially presented to the ED 02/10/20 due to one week of headache, N/V and diarrhea. She was discharged after successful PO challenge. She returned to her mother's house when her mother states she was not behaving normally, rubbing the comforter excessively, turing the light switch on and off repetitively and not verbally response so she returned to the ED 1/19. At that time a CT head revealed small volume subarachnoid hemorrhage and hyperdense dural sinuses with a filling defect concerning for dural venous thrombosis. 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 Neurology was consulted for further management.  Past Medical History:  Asthma Sickle cell trait DM  Significant Hospital Events:  Admitted 1/20 1/21 angiogram w/ clot extraction/ angioplasty 1/23 angiogram w/ angioplasty of venous thrombus  1/25 extubated  Consults:  PCCM IR  Procedures:  EEG 1/20- This study is suggestive of moderate diffuse encephalopathy, nonspecific etiology.  At 1531, patient was noted to have right upper extremity jerking.  Concomitant EEG showed high amplitude 2 to 3 Hz rhythmic delta slowing in bitemporal region with evolution in morphology and amplitude but without definite evolution in frequency concerning for focal motor seizure.  Significant Diagnostic Tests:   CT-scan of the brain IMPRESSION: 1. The study is positive for a small volume of subarachnoid hemorrhage. There is no midline shift. 2. Hyperdense  dural sinuses and cortical veins especially on the right in addition to a filling defect within the transverse sinus on the right is concerning for dural venous thrombosis and may be the source of the patient's subarachnoid hemorrhage. Emergent MRI/MRV is recommended for further evaluation.  MRI/MR venogram examination of the brain IMPRESSION: 1. Extensive, acute dural venous sinus thrombosis involving the superior sagittal sinus into the right internal jugular vein via the dominant right transverse and sigmoid system. Both veins of Trolard are thrombosed and there is straight sinus/deep cerebral thrombosis as well. 2. Deep watershed white matter infarcts, small volume subarachnoid hemorrhage, and left more than right frontoparietal cortical swelling due to #1.  Follow-up CT 1/22: cereberal edema same or slightly worse, clot burden has improved (personal review)  Follow-up CT 124 IMPRESSION: 1. Continued interval decrease in conspicuity of scattered small volume subarachnoid hemorrhage involving both frontoparietal convexities, now only faintly visible and nearly resolved. 2. Continued interval evolution of bilateral watershed territory infarcts, most pronounced at the left frontoparietal convexity. Overall size and distribution is relatively similar. No hemorrhagic transformation by CT. 3. Persistent but improved hyperdensity about the veins of Trolard since prior CT. Likewise, the remainder of the dural sinuses also appear less dense and engorged as compared to previous. 4. No other new acute intracranial abnormality.  Interim History / Subjective:   Extubated yesterday, has tolerated well Afebrile  Objective   Blood pressure (!) 148/78, pulse (!) 104, temperature 98.6 F (37 C), temperature source Oral, resp. rate 16, height 5\' 5"  (1.651 m), weight 71 kg, last menstrual period 02/03/2020, SpO2 100 %.    Vent Mode: CPAP;PSV FiO2 (%):  [30 %] 30 % PEEP:  [5 cmH20] 5 cmH20 Pressure  Support:  [12 cmH20] 12 cmH20   Intake/Output Summary (Last 24 hours) at 02/18/2020 0957 Last data filed at 02/18/2020 0900 Gross per 24 hour  Intake 2829.85 ml  Output 5150 ml  Net -2320.15 ml   Filed Weights   02/16/20 0500 02/17/20 0500 02/18/20 0500  Weight: 69.8 kg 70 kg 71 kg    Examination: General: Well-nourished well-developed female , no distress HEENT: Mild pallor, no icterus, no JVD Neuro: Follows commands.  Decreased movement right arm, moves right leg and left side well CV: Heart sounds are regular regular rate and rhythm PULM: Diminished in the bases GI: soft, bsx4 active  GU: Amber urine Extremities: warm/dry, negative edema  Skin: no rashes or lesions  145, normal electrolytes, leukocytosis, stable anemia  Resolved Hospital Problem list     Assessment & Plan:   34yoF presented with AMS after one week of headache, n/v and diarrhea. CT head and MRI brain demonstrated small volume SAH and extensive dural venous sinus thrombosis.   Extensive dural venous thrombosis  Cerebral edema requiring titration of hypertonic saline Thought to be due to dehydration from GI illness and subsequently leading to congestion and causing SAH. Patient does have a history of sickle cell trait; hypercoagulable panel negative Except for mildly decreased functional protein S P. Currently on heparin drip 3% saline per neurology  Postprocedural respiratory failure-extubated 1/25  Plan: Progress with swallow evaluation, PT  Transient hypotension -likely due to sedated meds, now off Neo-Synephrine Treating for mastoiditis/aspiration with broad-spectrum antibiotics can change to Unasyn and complete course for 10 to 14 days with Augmentin eventually  PCCM will be available as needed  Kara Mead MD. FCCP. [facility] See Amion for pager  If no response to pager , please call [phone]  After 7:00 pm call Elink  [phone]   02/18/2020   02/18/2020, 9:57  AM

## 2020-02-18 NOTE — Progress Notes (Addendum)
STROKE TEAM PROGRESS NOTE  INTERVAL HISTORY No acute overnight events. Patient evaluated at bedside this morning, no family present in the room.  Patient is alert, awake, oriented to place this morning.  She was extubated yesterday's afternoon and she is tolerating it well except sometimes unable to hold her secretions.  Waiting for SLP evaluation and will decide whether she needs core track or not.  Follows some midline commands.  Moves all extremities with noxious stimuli.  Sodium level 145 this morning but patient doing clinically better.  Patient will be getting MRI brain and MRV this afternoon.  Blood pressure well controlled.  Neurologically patient is improving.  Vitals:   02/18/20 0600 02/18/20 0700 02/18/20 0800 02/18/20 0900  BP: (!) 160/81 (!) 158/69 (!) 162/83 (!) 148/78  Pulse: 94 97 83 (!) 104  Resp: 17 15 18 16   Temp:   98.6 F (37 C)   TempSrc:   Oral   SpO2: 100% 100% 99% 100%  Weight:      Height:       CBC:  Recent Labs  Lab 02/14/20 1111 02/15/20 1032 02/16/20 0419 02/16/20 1525 02/17/20 0400 02/18/20 0535  WBC 15.9*   < > 15.2*  --  13.0* 14.4*  NEUTROABS 9.8*  --  10.6*  --   --   --   HGB 8.6*   < > 7.2*   < > 8.2* 8.0*  HCT 27.7*   < > 23.2*   < > 26.2* 24.5*  MCV 93.9   < > 93.5  --  92.6 92.1  PLT 575*   < > 480*  --  467* 483*   < > = values in this interval not displayed.   Basic Metabolic Panel:  Recent Labs  Lab 02/15/20 1612 02/15/20 2300 02/17/20 0400 02/17/20 0900 02/17/20 2200 02/18/20 0535  NA 149*   < > 151*   < > 145 145  K  --    < > 3.1*  --   --  3.8  CL  --    < > 117*  --   --  111  CO2  --    < > 25  --   --  28  GLUCOSE  --    < > 207*  --   --  111*  BUN  --    < > 5*  --   --  5*  CREATININE  --    < > 0.39*  --   --  0.37*  CALCIUM  --    < > 8.7*  --   --  8.8*  MG 1.6*  --  1.8  --   --  2.2  PHOS 2.7  --   --   --   --  4.1   < > = values in this interval not displayed.   Lipid Panel:  Recent Labs  Lab  02/13/20 0333 02/14/20 0505 02/17/20 0500  CHOL 148  --   --   TRIG 60   < > 86  HDL 28*  --   --   CHOLHDL 5.3  --   --   VLDL 12  --   --   LDLCALC 108*  --   --    < > = values in this interval not displayed.   HgbA1c:  Recent Labs  Lab 02/13/20 0333  HGBA1C 4.9   Urine Drug Screen:  Recent Labs  Lab 02/12/20 0701  LABOPIA NONE DETECTED  COCAINSCRNUR  NONE DETECTED  LABBENZ NONE DETECTED  AMPHETMU NONE DETECTED  THCU NONE DETECTED  LABBARB NONE DETECTED    Alcohol Level  Recent Labs  Lab 02/12/20 0417  ETH <10    IMAGING past 24 hours  MR Brain 02/18/2020  Pending  MRV Head 02/18/2020  Pending  Echocardiogram 02/16/2020  IMPRESSIONS   1. Left ventricular ejection fraction, by estimation, is 55 to 60%. The  left ventricle has normal function. The left ventricle has no regional  wall motion abnormalities. Left ventricular diastolic parameters were  normal.  2. Right ventricular systolic function is normal. The right ventricular  size is normal. There is normal pulmonary artery systolic pressure.  3. Mild mitral valve leaflet thickening and calcification. Trivial mitral  valve regurgitation.  4. The aortic valve is tricuspid. There is mild thickening of the aortic  valve. Aortic valve regurgitation is not visualized. No aortic stenosis is  present.  5. The inferior vena cava is normal in size with <50% respiratory  variability, suggesting right atrial pressure of 8 mmHg.   CT Head wo contrast 02/16/2020  IMPRESSION: 1. Continued interval decrease in conspicuity of scattered small volume subarachnoid hemorrhage involving both frontoparietal convexities, now only faintly visible and nearly resolved. 2. Continued interval evolution of bilateral watershed territory infarcts, most pronounced at the left frontoparietal convexity. Overall size and distribution is relatively similar. No hemorrhagic transformation by CT. 3. Persistent but improved  hyperdensity about the veins of Trolard since prior CT. Likewise, the remainder of the dural sinuses also appear less dense and engorged as compared to previous. 4. No other new acute intracranial abnormality.  CT Head wo contrast 02/14/2020  IMPRESSION: Small volume bilateral frontoparietal and basilar cistern subarachnoid hemorrhage is unchanged. No new intracranial hemorrhage.  Small bilateral cerebral infarcts are not well demonstrated on this exam.  Increased conspicuity of left cerebral edema.  Hyperdense appearance of the cortical veins, superior sagittal and right transverse sinus is less conspicuous than prior exam.  IR Thrombectomy 02/13/2020  Findings. 1.Extensive thrombus load in the TS bilaterally,the Rt SS ,RT IJV and post one third of the SSS. Large chunks of clot removed with revascularization of the TS bilaterally and partially the RT SS and the torcula. Sig decreased venous congestion of the cortical veins noted post procedure. Post CT brain No ICH or mass effect or hydrocephalus. Clinically stable with pupils 62mm sluggish.  CT Head wo contrast 02/12/2020  IMPRESSION: 1. The study is positive for a small volume of subarachnoid hemorrhage. There is no midline shift. 2. Hyperdense dural sinuses and cortical veins especially on the right in addition to a filling defect within the transverse sinus on the right is concerning for dural venous thrombosis and may be the source of the patient's subarachnoid hemorrhage. Emergent MRI/MRV is recommended for further evaluation.  MR Brain wo contrast MR Venogram Head 02/12/2020 IMPRESSION: 1. Extensive, acute dural venous sinus thrombosis involving the superior sagittal sinus into the right internal jugular vein via the dominant right transverse and sigmoid system. Both veins of Trolard are thrombosed and there is straight sinus/deep cerebral thrombosis as well. 2. Deep watershed white matter infarcts, small  volume subarachnoid hemorrhage, and left more than right frontoparietal cortical swelling due to #1.  MR Brain wo contrast MR Venogram Head 02/15/2020  Brain MRI IMPRESSION: 1. Progressive left perirolandic venous infarction and petechial hemorrhage. 2. New edema in the deep gray nuclei without infarction.  Intracranial MRV: IMPRESSION: 1. Improved flow especially in the deep venous system. Improved  flow also seen in the proximal right transverse sinus. 2. Occlusive clot still seen in the superior sagittal, distal right transverse, and right sigmoid dural sinuses. No recanalization of the veins of Trolard.  EEG adult 02/12/2020  ABNORMALITY -Continue slow, generalized  IMPRESSION: This study is suggestive of moderate diffuse encephalopathy, nonspecific etiology.  At 1531, patient was noted to have right upper extremity jerking.  Concomitant EEG showed high amplitude 2 to 3 Hz rhythmic delta slowing in bitemporal region with evolution in morphology and amplitude but without definite evolution in frequency concerning for focal motor seizure.   EEG adult  02/16/2020  IMPRESSION: This study is suggestive of moderate to severe diffuse encephalopathy, nonspecific etiology.  No seizures or epileptiform discharges were seen throughout the recording.    PHYSICAL EXAM GENERAL: Stuporous. Intubated, laying in bed, in no acute distress Head: Normocephalic and atraumatic. EENT: No OP obstruction, normal conjunctiva.  LUNGS - Normal respiratory effort on room air CV - Tachycardic to the 130's on cardiac monitor, 2+ pedal pulses ABDOMEN - Soft, non-distended Ext: warm, well perfused Neurological:  Mental Status: Patient is extubated. Alert, awake, oriented to place.  Follow some midline commands.  One-word speech today. Cranial Nerves: Left gaze preference, doll's eye reflex +.  Both pupils small sluggishly reactive.  Face is asymmetric, decreased nasolabial fold on right.  Tongue  midline.  Fundi not visualized Motor: Brisk semipurposeful movements in the left upper and lower extremity to noxious stimuli, less on right extremities sensory: Unable to access. Deep Tendon Reflexes:  Right: Upper Extremity   Left: Upper extremity   biceps (C-5 to C-6) 2/4   biceps (C-5 to C-6) 2/4 tricep (C7) 2/4    triceps (C7) 2/4 Brachioradialis (C6) 2/4  Brachioradialis (C6) 2/4  Lower Extremity Lower Extremity  quadriceps (L-2 to L-4) 2/4   quadriceps (L-2 to L-4) 2/4 Achilles (S1) 2/4   Achilles (S1) 2/4 Gait: Deferred   ASSESSMENT/PLAN Ms. CLOTENE HISER is a 35 y.o. female with PMHx of asthma and sickle cell trait presented with altered mental status.  CT head revealed a small volume subarachnoid hemorrhage and hyperdense dural sinuses with filling defect concerning for dural venous thrombosis.  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  Extremely delicate situation, that is why, IR is consulted for thrombectomy and patient is with IR now.  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    MRI  and MRV 02/12/20 A). Extensive, acute dural venous sinus thrombosis involving the superior sagittal sinus into the right internal jugular vein via the dominant right transverse and sigmoid system. B).Both veins of Trolard are thrombosed and there is straight sinus/deep cerebral thrombosis as well. C). Deep watershed white matter infarcts, small volume subarachnoid hemorrhage, and left more than right frontoparietal cortical swelling due to #1.  IR Thrombectomy: 02/13/2020- Large chunks of clot  removed with revascularization of the TS bilaterally and partially the RT SS and the torcula.Sig decreased venous congestion of the cortical veins noted post procedure.  MRI and MRV 02/15/20 - Improved flow especially in the deep venous system. Improved flow also seen in the proximal right transverse  sinus. Occlusive clot still seen in the superior sagittal, distal right transverse, and right sigmoid dural sinuses. No recanalization of the veins of Trolard. Progressive left perirolandic venous infarction and petechial hemorrhage. New edema in the deep gray nuclei without infarction.  IR Thrombectomy: 02/15/2020 - complete revascularization of SSS,Straight S,V of Galen and ICVs ,and RT TS. Sig focal stenosis at the RT TS/SS junction angioplastied with balloon with  improved flow into RT SS.   MRI and MRV : Pending  2D Echo - EF is 55 to 60%.  LDL 108  HgbA1c 4.9   Hypercoagulable panel: mildly decreased protein S, but may be an acute phase reactant and should be rechecked as an outpatient in 6 to 8 weeks.  Repeat COVID testing: negative on 02/14/20 and on 02/11/20  VTE prophylaxis - IV heparin  No antithrombotic prior to admission, now on IV heparin for dural venous thrombosis    Add amantadine to facilitate arousal  Therapy recommendations: pending  Disposition: pending  Cerebral edema   On 3% saline  MRI 1/23 new edema in the deep gray nuclei without infarction  MRI pending  Na goal 150-155  Na monitoring  Na 149->150->153>151>145  Focal motor seizure  02/12/20 EEG showed high amplitude 2 to 3 Hz rhythmic delta slowing in bitemporal region with evolution in morphology and amplitude but without definite evolution in frequency concerning for focal motor seizure.  On keppra 1000 bid  Repeat EEG 02/16/20 - moderate to severe diffuse encephalopathy  Continue keppra  Respiratory failure  Intubated on sedation -> extubated 1/25  Tolerating well so far  CCM on  board  Hypotension  BP on the low end  Phenylephrine started now off  . On IVF and TF . BP goal 120-160 . Long-term BP goal normotensive  Anemia - acute blood loss  Hgb - 11.0->8.2->8.6->7.2->7.0>8.2>8.0  May related to repeated procedure and IV heparin  1 unit PRBC transfusion on 02/16/2020  CBC monitoring  Hyperlipidemia  Home meds: None  LDL 108, goal < 70  Will consider statin when po access  Continue statin at discharge  Dysphagia   Off OG 02/17/20  No po access now  NPO so far  May need to consider cortrak if not passing swallow  Other Stroke Risk Factors  Sickle Cell Trait: No associated risk found due to sickle cell trait.    Other Active Problems  Asthma   Leukocytosis - WBC's - 13.3->15.9 (afebrile) >15.2  Hypokalemia - potassium - 2.9->3.0->3.6->3.1 >3.8  Hypophosphatemia - Phosphorus - 2.0->1.3->1.9>4.1  Mastoiditis on MRI.  Unsure if this is contributing to venous sinus thrombosis but will start antibiotics  Hospital day # 6   02/18/2020 10:21 AM  ATTENDING NOTE: I reviewed above note and agree with the assessment and plan. Pt was seen and examined.   RN at bedside.  Patient reclining in bed, lethargic but eyes open, awake alert, extubated yesterday, tolerating well so far.  Slow responding to questions however orientated x3, answer correct for place, month and mom's name.  Able to repeat simple sentences, however did not name.  Follows simple commands.  Left gaze preference, right gaze incomplete, blinking to visual threat bilaterally, still has mild right facial droop, tongue midline. Lethargic not cooperative with muscle strength testing but brisk movement and localize to pain on the left but less on the right. Sensation, coordination not cooperative and gait not tested.  Pt mental status much improved today. Will repeat MRI and MRV to further evaluate venous status. Continue  3% for now, as well as heparin IV and amantadine. Abx changed  to unasyn. Currently pt no PO access, if not passing swallow, will need cortrak.   Jill Hawking, MD PhD Stroke Neurology 02/18/2020 10:21 AM  This patient is critically ill due to extensive CSVT, cerebral edema, focal seizure, hypotension, respiratory failure, dysphagia and at significant risk of neurological worsening, death form brain herniation, status epilepticus, venous thrombosis extension, heart failure. This patient's care requires constant monitoring of vital signs, hemodynamics, respiratory and cardiac monitoring, review of multiple databases, neurological assessment, discussion with family, other specialists and medical decision making of high complexity. I spent 35 minutes of neurocritical care time in the care of this patient.   To contact Stroke Continuity provider, please refer to [email or web address] After hours, contact General Neurology

## 2020-02-18 NOTE — Progress Notes (Addendum)
Patient tachycardic >120s. Patient is also not following commands, and non interactive. Received 2mg  ativan 1120 am prior to MRI. This RN was told at report that ativan may be causing the patient to be this way. Dr. Erlinda Hong paged, awaiting cal back. This RN will continue to monitor

## 2020-02-18 NOTE — Progress Notes (Signed)
SLP Cancellation Note  Patient Details Name: Jill Clarke MRN: [id number] DOB: 10-Sep-1985   Cancelled treatment:       Reason Eval/Treat Not Completed: Patient at procedure or test/unavailable  Gabriel Rainwater MA, CCC-SLP   Jalin Alicea Meryl 02/18/2020, 10:47 AM

## 2020-02-18 NOTE — Progress Notes (Signed)
Assisted tele visit to patient with family member.  Karris Deangelo Anderson, RN   

## 2020-02-19 DIAGNOSIS — R1312 Dysphagia, oropharyngeal phase: Secondary | ICD-10-CM

## 2020-02-19 LAB — BASIC METABOLIC PANEL
Anion gap: 11 (ref 5–15)
BUN: <5 mg/dL — ABNORMAL LOW (ref 6–20)
CO2: 22 mmol/L (ref 22–32)
Calcium: 7.6 mg/dL — ABNORMAL LOW (ref 8.9–10.3)
Chloride: 128 mmol/L — ABNORMAL HIGH (ref 98–111)
Creatinine, Ser: 0.4 mg/dL — ABNORMAL LOW (ref 0.44–1.00)
GFR, Estimated: >60 mL/min (ref >60)
Glucose, Bld: 82 mg/dL (ref 70–99)
Potassium: 2.8 mmol/L — ABNORMAL LOW (ref 3.5–5.1)
Sodium: 161 mmol/L (ref 135–145)

## 2020-02-19 LAB — CBC
HCT: 25.5 % — ABNORMAL LOW (ref 36.0–46.0)
Hemoglobin: 8.4 g/dL — ABNORMAL LOW (ref 12.0–15.0)
MCH: 30.8 pg (ref 26.0–34.0)
MCHC: 32.9 g/dL (ref 30.0–36.0)
MCV: 93.4 fL (ref 80.0–100.0)
Platelets: 477 10*3/uL — ABNORMAL HIGH (ref 150–400)
RBC: 2.73 MIL/uL — ABNORMAL LOW (ref 3.87–5.11)
RDW: 14.3 % (ref 11.5–15.5)
WBC: 13.5 10*3/uL — ABNORMAL HIGH (ref 4.0–10.5)
nRBC: 0.1 % (ref 0.0–0.2)

## 2020-02-19 LAB — GLUCOSE, CAPILLARY
Glucose-Capillary: 64 mg/dL — ABNORMAL LOW (ref 70–99)
Glucose-Capillary: 76 mg/dL (ref 70–99)
Glucose-Capillary: 78 mg/dL (ref 70–99)
Glucose-Capillary: 80 mg/dL (ref 70–99)
Glucose-Capillary: 98 mg/dL (ref 70–99)
Glucose-Capillary: 99 mg/dL (ref 70–99)

## 2020-02-19 LAB — SODIUM
Sodium: 142 mmol/L (ref 135–145)
Sodium: 140 mmol/L (ref 135–145)
Sodium: 139 mmol/L (ref 135–145)

## 2020-02-19 LAB — TSH: TSH: 1.933 u[IU]/mL (ref 0.350–4.500)

## 2020-02-19 LAB — HEPARIN LEVEL (UNFRACTIONATED)
Heparin Unfractionated: 0.43 IU/mL (ref 0.30–0.70)
Heparin Unfractionated: 0.48 IU/mL (ref 0.30–0.70)

## 2020-02-19 MED ORDER — DEXTROSE 50 % IV SOLN
INTRAVENOUS | Status: AC
  Administered 2020-02-19: 25 mL
  Filled 2020-02-19: qty 50

## 2020-02-19 MED ORDER — POTASSIUM CHLORIDE 10 MEQ/50ML IV SOLN
10.0000 meq | INTRAVENOUS | Status: AC
  Administered 2020-02-19 (×8): 10 meq via INTRAVENOUS
  Filled 2020-02-19 (×8): qty 50

## 2020-02-19 MED ORDER — CALCIUM GLUCONATE-NACL 1-0.675 GM/50ML-% IV SOLN
1.0000 g | Freq: Once | INTRAVENOUS | Status: AC
  Administered 2020-02-19: 1000 mg via INTRAVENOUS
  Filled 2020-02-19: qty 50

## 2020-02-19 MED ORDER — PANTOPRAZOLE SODIUM 40 MG IV SOLR
40.0000 mg | INTRAVENOUS | Status: DC
  Administered 2020-02-19 – 2020-02-23 (×5): 40 mg via INTRAVENOUS
  Filled 2020-02-19 (×5): qty 40

## 2020-02-19 MED ORDER — DEXTROSE-NACL 5-0.9 % IV SOLN: INTRAVENOUS | Status: DC

## 2020-02-19 NOTE — Progress Notes (Signed)
Physical Therapy Treatment Patient Details Name: Jill Clarke MRN: [id number] DOB: [birth year] Today's Date: 02/19/2020    History of Present Illness 35 y.o. female with a history significant for asthma and sickle cell trait presented to ED 1/19 for N/V and discharged home. Returned 1/20 due to AMS. CT Head revealing a small volume subarachnoid hemorrhage and hyperdense dural sinuses with a filling defect concerning for dural venous thrombosis. 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 Intubated 1/21-1/25.    PT Comments    Patient awake with eyes open and frequent yawning on arrival (noted associated reaction into flexion of RUE). As preparing to attempt to sit EOB, pt continued to fall asleep and required more stimulation to re-awaken her. Moved into chair position and pt briefly more awake and followed command to squeeze with each hand and then immediately back to sleep. PROM RLE with attempts to awaken pt with no response elicited. Pt with head leaning to her right entire session and used rolled blanket in pillowcase to position head in midline and pt remained in semi-chair position at end of session. RN reported pt had been more alert earlier today, however she may be fatigued from getting a bath ~1 hour prior to session     Follow Up Recommendations  LTACH     Equipment Recommendations  Wheelchair (measurements PT);Wheelchair cushion (measurements PT);Hospital bed    Recommendations for Other Services       Precautions / Restrictions Precautions Precautions: Other (comment) Precaution Comments: multiple lines    Mobility  Bed Mobility               General bed mobility comments: total assist supine to chair position; head leans to right and repositioned to neutral with pillows and towels rolls  Transfers                 General transfer comment: n/a defer to future session  Ambulation/Gait                  Stairs             Wheelchair Mobility    Modified Rankin (Stroke Patients Only) Modified Rankin (Stroke Patients Only) Pre-Morbid Rankin Score: No symptoms Modified Rankin: Severe disability     Balance                                            Cognition Arousal/Alertness: Lethargic (off sedation; had bath ~1 hour prior) Behavior During Therapy: Flat affect Overall Cognitive Status: Difficult to assess Area of Impairment: Orientation;Attention;Following commands                 Orientation Level: Place (Time, situation NT as pt falling asleep) Current Attention Level: Focused;Sustained   Following Commands: Follows one step commands with increased time;Follows one step commands inconsistently       General Comments: Once elevated into chair position, briefly more alert and did squeeze with bil hands (left stronger)      Exercises      General Comments General comments (skin integrity, edema, etc.): on room air; HR 96-125 with attempts to incr arousal; stated her name with very soft voice and delay      Pertinent Vitals/Pain Pain Assessment: Faces Faces Pain Scale: No hurt    Home Living  Prior Function            PT Goals (current goals can now be found in the care plan section) Acute Rehab PT Goals Patient Stated Goal: unable Time For Goal Achievement: 03/02/20 Potential to Achieve Goals: Fair Progress towards PT goals: Not progressing toward goals - comment (more fatigued, lethargic)    Frequency    Min 3X/week      PT Plan Current plan remains appropriate    Co-evaluation              AM-PAC PT "6 Clicks" Mobility   Outcome Measure  Help needed turning from your back to your side while in a flat bed without using bedrails?: Total Help needed moving from lying on your back to sitting on the side of a flat bed without using bedrails?: Total Help needed  moving to and from a bed to a chair (including a wheelchair)?: Total Help needed standing up from a chair using your arms (e.g., wheelchair or bedside chair)?: Total Help needed to walk in hospital room?: Total Help needed climbing 3-5 steps with a railing? : Total 6 Click Score: 6    End of Session   Activity Tolerance: Patient limited by lethargy;Patient limited by fatigue Patient left: in bed;with call bell/phone within reach;with bed alarm set (bil mittens) Nurse Communication: Other (comment) (not following commands as well as she did earlier for nurse/MD; RN reported pt had a bath ~1 hour prior to session) PT Visit Diagnosis: Other symptoms and signs involving the nervous system (R29.898)     Time: 1100-1125 PT Time Calculation (min) (ACUTE ONLY): 25 min  Charges:  $Neuromuscular Re-education: 23-37 mins                      Arby Barrette, PT Pager [phone]    Rexanne Mano 02/19/2020, 12:44 PM

## 2020-02-19 NOTE — Progress Notes (Signed)
Assisted tele visit to patient with family member.  Thomas, Margareta Laureano Renee, RN   

## 2020-02-19 NOTE — Evaluation (Signed)
Clinical/Bedside Swallow Evaluation Patient Details  Name: Jill Clarke MRN: [id number] Date of Birth: [birth year]  Today's Date: 02/19/2020 Time: SLP Start Time (ACUTE ONLY): 2774 SLP Stop Time (ACUTE ONLY): 0936 SLP Time Calculation (min) (ACUTE ONLY): 8 min  Past Medical History:  Past Medical History:  Diagnosis Date  . Asthma    uses inhaler prn  . Diabetes mellitus without complication ([place])   . Sickle cell trait ([place])   . Unspecified high-risk pregnancy 01/27/2011   Past Surgical History:  Past Surgical History:  Procedure Laterality Date  . IR ANGIO INTRA EXTRACRAN SEL COM CAROTID INNOMINATE BILAT MOD SED  02/13/2020  . IR ANGIO INTRA EXTRACRAN SEL COM CAROTID INNOMINATE UNI R MOD SED  02/13/2020  . IR ANGIO INTRA EXTRACRAN SEL INTERNAL CAROTID UNI L MOD SED  02/13/2020  . IR ANGIO INTRA EXTRACRAN SEL INTERNAL CAROTID UNI R MOD SED  02/15/2020  . IR ANGIO VERTEBRAL SEL VERTEBRAL BILAT MOD SED  02/13/2020  . IR CT HEAD LTD  02/13/2020  . IR CT HEAD LTD  02/15/2020  . IR THROMBECT VENO MECH MOD SED  02/13/2020  . IR THROMBECT VENO MECH REPT MOD SED  02/15/2020  . RADIOLOGY WITH ANESTHESIA N/A 02/13/2020   Procedure: IR WITH ANESTHESIA;  Surgeon: Luanne Bras, MD;  Location: [facility];  Service: Radiology;  Laterality: N/A;  . RADIOLOGY WITH ANESTHESIA N/A 02/15/2020   Procedure: IR WITH ANESTHESIA;  Surgeon: Radiologist, Medication, MD;  Location: [facility];  Service: Radiology;  Laterality: N/A;  . TONSILLECTOMY    . WISDOM TOOTH EXTRACTION     HPI:  35 yo presented with AMS. MRI >  left frontoparietal hemorrhagic venous infarct (no significant changed 02/15/2020.),  hemorrhagic infarct within the right callosal, possible new punctate acute infarct within the left thalamocapsular junction, bilateral deep watershed white matter infarcts, persistent trace subarachnoid hemorrhage along the left  frontoparietal convexity. Intubated 1/20-1/25. CXR Diffuse bilateral pulmonary  infiltrates/edema. PMH: DM, sickle cell trait, asthma.   Assessment / Plan / Recommendation Clinical Impression  "Jill Clarke" is not yet safe to begin solids or liquids due to current level of alertness, awareness and cognitive status. Eyes open, responds with moderate delays in a hoarse, dysphonic and barely audible vocal quality. General facial and lingual weakness and decreased response during oral-motor exam. Endurance and respiratory support for throat clear very diminished. Required cues to open mouth, seal lips around spoon from an arousal/cognitive standpoint. Oral transit delayed and supsect swallow protective onset was as well. Recommend staff continue to provide oral care, no meds/liquids/solids and ST service will return tomorrow for hopeful improvments and treatment plan. SLP Visit Diagnosis: Dysphagia, unspecified (R13.10)    Aspiration Risk  Moderate aspiration risk    Diet Recommendation NPO   Medication Administration:  (none)    Other  Recommendations Oral Care Recommendations: Oral care QID   Follow up Recommendations Inpatient Rehab      Frequency and Duration min 2x/week  2 weeks       Prognosis Prognosis for Safe Diet Advancement: Good Barriers to Reach Goals: Cognitive deficits;Severity of deficits      Swallow Study   General HPI: 35 yo presented with AMS. MRI >  left frontoparietal hemorrhagic venous infarct (no significant changed 02/15/2020.),  hemorrhagic infarct within the right callosal, possible new punctate acute infarct within the left thalamocapsular junction, bilateral deep watershed white matter infarcts, persistent trace subarachnoid hemorrhage along the left  frontoparietal convexity. Intubated 1/20-1/25. CXR Diffuse bilateral pulmonary infiltrates/edema. PMH:  DM, sickle cell trait, asthma. Type of Study: Bedside Swallow Evaluation Previous Swallow Assessment:  (none) Diet Prior to this Study: NPO Temperature Spikes Noted: No Respiratory Status:  Room air History of Recent Intubation: Yes Length of Intubations (days): 5 days Date extubated: 02/17/20 Behavior/Cognition: Cooperative;Requires cueing;Distractible;Lethargic/Drowsy (awake) Oral Cavity Assessment: Excessive secretions Oral Care Completed by SLP: Yes Oral Cavity - Dentition: Adequate natural dentition Vision: Functional for self-feeding Self-Feeding Abilities: Total assist Patient Positioning: Upright in bed Baseline Vocal Quality: Low vocal intensity;Other (comment);Hoarse (dysphonic) Volitional Cough: Cognitively unable to elicit (and d/t weakness) Volitional Swallow: Unable to elicit    Oral/Motor/Sensory Function Overall Oral Motor/Sensory Function: Moderate impairment   Ice Chips Ice chips: Not tested   Thin Liquid Thin Liquid: Impaired Presentation: Spoon Oral Phase Impairments: Reduced labial seal;Reduced lingual movement/coordination;Poor awareness of bolus Oral Phase Functional Implications: Other (comment) (min anterior spill) Pharyngeal  Phase Impairments: Suspected delayed Swallow    Nectar Thick Nectar Thick Liquid: Not tested   Honey Thick Honey Thick Liquid: Not tested   Puree Puree: Impaired Oral Phase Impairments: Reduced labial seal;Poor awareness of bolus;Reduced lingual movement/coordination Oral Phase Functional Implications: Prolonged oral transit Pharyngeal Phase Impairments:  (no overt)   Solid     Solid: Not tested      Houston Siren 02/19/2020,11:51 AM Orbie Pyo Colvin Caroli.Ed Risk analyst [phone] Office [phone]

## 2020-02-19 NOTE — Progress Notes (Signed)
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  She is s/p cerebral arteriogram with emergent mechanical thrombectomy of bilateral transverse sinuses, and partial right sigmoid sinus, and torcula.  Residual thrombus load s/p cerebral arteriogram with mechanical thrombectomy of superior sagittal sinus, straight sinus, vein of galen, bilateral IJs, and right transverse sinus 02/15/2020 by Dr. Estanislado Pandy.  Subjective:  She is sideways in bed and seems slightly agitated, wiggling and moving her both legs.   She is oriented to place. She can identify objects slowly. She recognized my iPhone as "Cell phone".  Allergies: Patient has no known allergies.  Medications: Prior to Admission medications   Not on File     Vital Signs: BP (!) 176/93   Pulse 82   Temp 98.4 F (36.9 C) (Axillary)   Resp (!) 22   Ht '5\' 5"'  (1.651 m)   Wt 71 kg   LMP 02/03/2020 (Approximate)   SpO2 99%   BMI 26.05 kg/m   Physical Exam Vitals reviewed.  Constitutional:      Appearance: Normal appearance.  Cardiovascular:     Rate and Rhythm: Normal rate.  Pulmonary:     Effort: Pulmonary effort is normal. No respiratory distress.  Neurological:     Mental Status: She is alert.     Comments: Continues to have some word finding difficulty. Did identify my phone. PERRL bilaterally. Tongue midline. Spontaneously move left side and RLE but no spontaneous movements of RUE.      Imaging: CT Head Wo Contrast  Result Date: 02/16/2020 CLINICAL DATA:  Follow-up examination for acute stroke. EXAM: CT HEAD WITHOUT CONTRAST TECHNIQUE: Contiguous axial images were obtained from the base of the skull through the vertex without intravenous contrast.  COMPARISON:  Prior CT and MRI from 02/15/2020. FINDINGS: Brain: Continued interval decrease in conspicuity of scattered small volume subarachnoid hemorrhage involving both frontal parietal convexities, now only faintly visible and nearly resolved. No significant basilar cistern hemorrhage now evident. Continued interval evolution of bilateral watershed territory infarcts, most pronounced at the left frontoparietal convexity. Overall size and distribution is relatively similar. No hemorrhagic transformation evident by CT. Previously noted edema involving the deep gray nuclei difficult to visualize by CT, although there are suspected persistent changes. No new hemorrhage or acute ischemic changes. No midline shift or significant mass effect. No hydrocephalus or extra-axial fluid collection. Vascular: Persistent but improved hyperdensity seen about the veins of true large since prior CT. Likewise, the superior sagittal sinus appears less dense and engorged as compared to previous. Remainder of the dural sinuses also appear less dense. No hyperdense arterial vessel. Skull: Scalp soft tissues demonstrate no new finding. Calvarium unchanged. Sinuses/Orbits: Globes and orbital soft tissues within normal limits. Extensive mucosal thickening with air-fluid levels noted throughout the paranasal sinuses similar to previous. Trace right mastoid effusion. Other: None. IMPRESSION: 1. Continued interval decrease in conspicuity of scattered small volume subarachnoid hemorrhage involving both frontoparietal convexities, now only faintly visible and nearly resolved. 2. Continued interval evolution of bilateral watershed territory infarcts, most pronounced at the left frontoparietal convexity. Overall size and distribution is relatively similar. No hemorrhagic transformation by CT. 3. Persistent but improved hyperdensity about the veins of [place]rol[place] since prior CT. Likewise, the remainder of the dural sinuses  also appear less dense and  engorged as compared to previous. 4. No other new acute intracranial abnormality. Electronically Signed   By: Jeannine Boga M.D.   On: 02/16/2020 00:59   MR BRAIN WO CONTRAST  Result Date: 02/18/2020 CLINICAL DATA:  Stroke, follow-up. EXAM: MRI HEAD WITH CONTRAST MRV HEAD WITHOUT AND WITH CONTRAST TECHNIQUE: Multiplanar, multiecho pulse sequences of the brain and surrounding structures were obtained with intravenous contrast. Angiographic images of the intracranial venous structures were obtained using MRV technique without and with intravenous contrast. COMPARISON:  Head CT 02/16/2020, report from thrombectomy 02/15/2020, MRI/MRV head 02/15/2020 CONTRAST:  34m GADAVIST GADOBUTROL 1 MMOL/ML IV SOLN FINDINGS: MR HEAD FINDINGS Brain: The examination is intermittently motion degraded, limiting evaluation. Most notably, there is severe motion degradation of the sagittal T1 weighted sequence and moderate motion degradation of the coronal T2 weighted sequence. The dominant acute/subacute hemorrhagic venous infarct within the left frontoparietal lobes has not significantly changed. Unchanged local mass effect with partial effacement of the left lateral ventricle. Restricted diffusion at site of an acute/early subacute hemorrhagic infarct within the right callosal splenium has subtly increased in extent. Persistent although decreased T2/FLAIR hyperintensity within the bilateral basal ganglia and thalami. Possible new punctate acute infarct within the left thalamus capsular junction (series 4, image 24). Bilateral acute/early subacute deep watershed white matter infarcts otherwise appear similar in distribution and extent. Persistent trace subarachnoid hemorrhage along the left frontoparietal convexity. No midline shift. Vascular: Expected proximal arterial flow voids. Skull and upper cervical spine: Within limitations of motion degradation, no focal marrow lesion is identified. Sinuses/Orbits: Visualized orbits  show no acute finding. Fluid within the posterior ethmoid air cells and sphenoid sinuses bilaterally. Right mastoid effusion. MRV HEAD FINDINGS Flow within the superior sagittal sinus has significantly improved since the prior MRV of 02/15/2020. However, there are persistent foci of nonocclusive thrombus within this vessel. Nonocclusive thrombus within the left transverse and proximal sigmoid dural venous sinuses has increased. Persistent nonocclusive thrombus within the distal right transverse sinus. Persistent near occlusive or occlusive thrombus within the right sigmoid sinus and visualized right internal jugular vein. The degree of flow within the deep venous system is unchanged. The veins of [place] and a few additional cerebral veins overlying the bilateral frontoparietal convexities remain thrombosed (best appreciated on the axial precontrast T1 weighted sequence). MRV impression #2 will be called to the ordering clinician or representative by the Radiologist Assistant, and communication documented in the PACS or [employer] IMPRESSION: MRI brain: 1. Motion degraded exam. 2. 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 3. Restricted diffusion at site of an acute/subacute hemorrhagic infarct within the right callosal splenium has subtly increased in extent. 4. T2/FLAIR hyperintensity within the deep gray nuclei persists but has diminished. However, there may be a new punctate acute infarct within the left thalamocapsular junction. 5. 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 6. Persistent trace subarachnoid hemorrhage along the left frontoparietal convexity. MRV head: 1. Flow within the superior sagittal sinus has significantly improved since the prior MRV of 02/15/2020. However, there are persistent foci of nonocclusive thrombus within this vessel. 2.  Nonocclusive thrombus within the left transverse and proximal sigmoid dural venous sinuses has increased. 3. Persistent nonocclusive thrombus within the right transverse sinus. 4. Persistent near occlusive or occlusive thrombus within the right sigmoid sinus and visualized right internal jugular vein. 5. The degree of flow within the deep  venous system is unchanged. 6. The veins of Trolard and a few additional cerebral veins overlying the bilateral frontoparietal convexities remain thrombosed. Electronically Signed   By: Kellie Simmering DO   On: 02/18/2020 11:38   IR THROMBECT VENO MECH REPT MOD SED  Result Date: 02/18/2020 INDICATION: Stuporous.  Dural venous sinus thrombosis. EXAM: 1. EMERGENT LARGE VESSEL OCCLUSION THROMBOLYSIS (POSTERIOR CIRCULATION) COMPARISON:  MRI MRV of February 15, 2020. MEDICATIONS: Ancef 2 g IV antibiotic was administered within 1 hour of the procedure. ANESTHESIA/SEDATION: General anesthesia CONTRAST:  Isovue 300 100 mL FLUOROSCOPY TIME:  Fluoroscopy Time: 96 minutes 12 seconds (3924 mGy). COMPLICATIONS: None immediate. TECHNIQUE: Following a full explanation of the procedure along with the potential associated complications, an informed witnessed consent was obtained. The risks of intracranial hemorrhage of 10%, worsening neurological deficit, ventilator dependency, death and inability to revascularize were all reviewed in detail with the patient's mother. The patient was then put under general anesthesia by the Department of Anesthesiology at [facility]. The in situ right common femoral vein 8 [place], and in situ left common femoral arterial French sheath were exchanged over a 0.035 inch guidewire for a new sheath which were then connected to continuous heparinized saline infusion. Over a 0.035 inch Roadrunner guidewire, a 5 [place] JB 1 catheter was advanced to the aortic arch region and positioned in the right common carotid artery. Through the right common femoral vein, over  a 0.035 inch Roadrunner guidewire, a 5 French catheter was administered to the right internal jugular vein to the skull base. This in turn was then exchanged over a 0.035 inch 300 cm Constance Holster exchange guidewire for an 8 French 80 cm Neuron Max sheath. The guidewire was removed. Good aspiration obtained from the hub of the Neuron Max sheath. A gentle control arteriogram performed through this demonstrates safe position of the tip of the Neuron Max sheath. This was then connected to continuous heparinized saline infusion. FINDINGS: The right common carotid arteriogram demonstrates the right external carotid artery and its major branches to be widely patent. The right internal carotid artery at the bulb to the cranial skull base is widely patent. The petrous, cavernous and supraclinoid segments are also widely patent. The right middle cerebral artery and the right anterior cerebral opacify into the capillary and venous phases. The venous phase demonstrates again significantly decreased venous congestion overlying both cerebral convexities. Patency is noted of the right transverse sinus to the right transverse sinus sigmoid sinus junction. This vein of Labbe and the posterior temporal vein are patent draining into venous channel running parallel to the to the occluded right internal jugular vein. Patency is maintained at the torcula, the left transverse sinus, the left sigmoid sinus and the left internal jugular vein. PROCEDURE: An 071 95 cm Benchmark catheter was advanced through the Neuron Max guide sheath to the left internal jugular occluded vein. An 035 inch Roadrunner wire was first advanced without difficulty into the right sigmoid sinus and the distal right transverse sinus. However, advancement of the 6 [place] Benchmark was met with significant resistance. At this time, in a coaxial manner and with constant heparinized saline infusion over a 016 inch Fathom micro guidewire, a Marksman 027 microcatheter was  advanced without difficulty to the torcula. The guidewire was removed. Control arteriogram was then performed from the proximal right transverse sinus. This confirmed brisk flow through the right transverse and left transverse sinuses. This also demonstrated the partially occluded on the torcula. The 016 micro guidewire was reintroduced,  and the torque device was advanced without difficulty into the distal 1/3 of the superior sagittal sinus and eventually into the junction of the anterior and the middle 1/3. Micro guidewire was removed. Good aspiration was obtained from the hub of the microcatheter. A gentle control arteriogram performed through this demonstrated extensive occlusion of the anterior 2/3 of the superior sagittal sinus. Through the microcatheter, a 6 mm x 40 mm Solitaire X retrieval device was advanced and deployed through the microcatheter. The microcatheter was then gently retrieved. The 6 [place] Benchmark guide catheter was then advanced to just inside the torcula having the hub at the right groin. At this time as constant aspiration was applied through the Benchmark, the retrieval device and the microcatheter were retrieved and removed. Copious amounts of clot were seen in the canister, and also in the retrieval device. Three further passes were made with a similar arrangement resulting in more clot being removed. A final pass was then made through a 071 136 cm Zoom aspiration catheter which replaced the Benchmark catheter. This could be advanced more distally to the anterior third of the superior sagittal sinus. Constant aspiration was applied as the aspiration catheter was retrieved slowly more proximally and into the right transverse sinus. Again more clot was obtained especially in the canister. A control arteriogram performed through the diagnostic catheter in the right common carotid artery into the venous phase now demonstrated complete recanalization of the superior sagittal sinus, the  right transverse sinus, the left transverse sinus, the left sigmoid sinus and the left internal jugular vein. Gentle control injection performed through the Zoom aspiration catheter in the mid right transverse sinus demonstrated significant stenosis of the right transverse sinus sigmoid sinus junction. Also noted was severely contracted right sigmoid sinus to the internal jugular vein. At this time, a 8 mm x 40 mm sterling balloon angioplasty catheter was advanced over a 0.014 inch exchange micro guidewire. Balloon angioplasty was then performed extending from the distal 1/3 of the transverse sinus on the right side, the transverse sinus sigmoid sinus junction, and the sigmoid sinus. At the end of this, a control arteriogram performed in the venous phase demonstrated marginally improved flow through the right sigmoid sinus into the now patent right internal jugular vein. However, there continued be prominent collateral just lateral to this at the level of the transverse sinus sigmoid sinus junction draining the posterior temporal vein, and the vein of Labbe. There continued be stenosis at the right transverse sinus sigmoid sinus junction partially improved. As venous grafts continued through superior sagittal sinus and transverse sinuses and the right sigmoid sinus and through a prominent collateral just into the right sigmoid sinus, it was elected to stop. 071 Zoom aspiration catheter, and the Neuron Max sheath were removed with hemostasis achieved at the right groin venous puncture site. The left femoral sheath was then removed and hemostasis achieved with a 5 [place] ExoSeal closure device. Distal pulses remained palpable. A CT of the brain demonstrated no gross subarachnoid hemorrhage or mass effect or midline shift. The patient was left intubated and transferred to the neuro ICU. IMPRESSION: Status post endovascular complete revascularization of entire superior sagittal sinus, the right transverse sinus, the  left transverse sinus, the left sigmoid sinus, the left internal jugular vein, and partially the right sigmoid sinus and right internal jugular vein using the aspiration, and stent retrieval technique as described. PLAN: Follow-up in the clinic 4 to 6 weeks post discharge. Electronically Signed   By: Luanne Bras  M.D.   On: 02/17/2020 13:36   IR [place]  Result Date: 02/18/2020 INDICATION: Stuporous.  Dural venous sinus thrombosis. EXAM: 1. EMERGENT LARGE VESSEL OCCLUSION THROMBOLYSIS (POSTERIOR CIRCULATION) COMPARISON:  MRI MRV of February 15, 2020. MEDICATIONS: Ancef 2 g IV antibiotic was administered within 1 hour of the procedure. ANESTHESIA/SEDATION: General anesthesia CONTRAST:  Isovue 300 100 mL FLUOROSCOPY TIME:  Fluoroscopy Time: 96 minutes 12 seconds (3924 mGy). COMPLICATIONS: None immediate. TECHNIQUE: Following a full explanation of the procedure along with the potential associated complications, an informed witnessed consent was obtained. The risks of intracranial hemorrhage of 10%, worsening neurological deficit, ventilator dependency, death and inability to revascularize were all reviewed in detail with the patient's mother. The patient was then put under general anesthesia by the Department of Anesthesiology at [facility] ([place]). The in situ right common femoral vein 8 [place], and in situ left common femoral arterial French sheath were exchanged over a 0.035 inch guidewire for a new sheath which were then connected to continuous heparinized saline infusion. Over a 0.035 inch Roadrunner guidewire, a 5 [place] JB 1 catheter was advanced to the aortic arch region and positioned in the right common carotid artery. Through the right common femoral vein, over a 0.035 inch Roadrunner guidewire, a 5 French catheter was administered to the right internal jugular vein to the skull base. This in turn was then exchanged over a 0.035 inch 300 cm Constance Holster exchange guidewire for an 8 French 80 cm Neuron  Max sheath. The guidewire was removed. Good aspiration obtained from the hub of the Neuron Max sheath. A gentle control arteriogram performed through this demonstrates safe position of the tip of the Neuron Max sheath. This was then connected to continuous heparinized saline infusion. FINDINGS: The right common carotid arteriogram demonstrates the right external carotid artery and its major branches to be widely patent. The right internal carotid artery at the bulb to the cranial skull base is widely patent. The petrous, cavernous and supraclinoid segments are also widely patent. The right middle cerebral artery and the right anterior cerebral opacify into the capillary and venous phases. The venous phase demonstrates again significantly decreased venous congestion overlying both cerebral convexities. Patency is noted of the right transverse sinus to the right transverse sinus sigmoid sinus junction. This vein of Labbe and the posterior temporal vein are patent draining into venous channel running parallel to the to the occluded right internal jugular vein. Patency is maintained at the torcula, the left transverse sinus, the left sigmoid sinus and the left internal jugular vein. PROCEDURE: An 071 95 cm Benchmark catheter was advanced through the Neuron Max guide sheath to the left internal jugular occluded vein. An 035 inch Roadrunner wire was first advanced without difficulty into the right sigmoid sinus and the distal right transverse sinus. However, advancement of the 6 [place] Benchmark was met with significant resistance. At this time, in a coaxial manner and with constant heparinized saline infusion over a 016 inch Fathom micro guidewire, a Marksman 027 microcatheter was advanced without difficulty to the torcula. The guidewire was removed. Control arteriogram was then performed from the proximal right transverse sinus. This confirmed brisk flow through the right transverse and left transverse sinuses. This  also demonstrated the partially occluded on the torcula. The 016 micro guidewire was reintroduced, and the torque device was advanced without difficulty into the distal 1/3 of the superior sagittal sinus and eventually into the junction of the anterior and the middle 1/3. Micro guidewire was removed.  Good aspiration was obtained from the hub of the microcatheter. A gentle control arteriogram performed through this demonstrated extensive occlusion of the anterior 2/3 of the superior sagittal sinus. Through the microcatheter, a 6 mm x 40 mm Solitaire X retrieval device was advanced and deployed through the microcatheter. The microcatheter was then gently retrieved. The 6 [place] Benchmark guide catheter was then advanced to just inside the torcula having the hub at the right groin. At this time as constant aspiration was applied through the Benchmark, the retrieval device and the microcatheter were retrieved and removed. Copious amounts of clot were seen in the canister, and also in the retrieval device. Three further passes were made with a similar arrangement resulting in more clot being removed. A final pass was then made through a 071 136 cm Zoom aspiration catheter which replaced the Benchmark catheter. This could be advanced more distally to the anterior third of the superior sagittal sinus. Constant aspiration was applied as the aspiration catheter was retrieved slowly more proximally and into the right transverse sinus. Again more clot was obtained especially in the canister. A control arteriogram performed through the diagnostic catheter in the right common carotid artery into the venous phase now demonstrated complete recanalization of the superior sagittal sinus, the right transverse sinus, the left transverse sinus, the left sigmoid sinus and the left internal jugular vein. Gentle control injection performed through the Zoom aspiration catheter in the mid right transverse sinus demonstrated significant  stenosis of the right transverse sinus sigmoid sinus junction. Also noted was severely contracted right sigmoid sinus to the internal jugular vein. At this time, a 8 mm x 40 mm sterling balloon angioplasty catheter was advanced over a 0.014 inch exchange micro guidewire. Balloon angioplasty was then performed extending from the distal 1/3 of the transverse sinus on the right side, the transverse sinus sigmoid sinus junction, and the sigmoid sinus. At the end of this, a control arteriogram performed in the venous phase demonstrated marginally improved flow through the right sigmoid sinus into the now patent right internal jugular vein. However, there continued be prominent collateral just lateral to this at the level of the transverse sinus sigmoid sinus junction draining the posterior temporal vein, and the vein of Labbe. There continued be stenosis at the right transverse sinus sigmoid sinus junction partially improved. As venous grafts continued through superior sagittal sinus and transverse sinuses and the right sigmoid sinus and through a prominent collateral just into the right sigmoid sinus, it was elected to stop. 071 Zoom aspiration catheter, and the Neuron Max sheath were removed with hemostasis achieved at the right groin venous puncture site. The left femoral sheath was then removed and hemostasis achieved with a 5 [place] ExoSeal closure device. Distal pulses remained palpable. A CT of the brain demonstrated no gross subarachnoid hemorrhage or mass effect or midline shift. The patient was left intubated and transferred to the neuro ICU. IMPRESSION: Status post endovascular complete revascularization of entire superior sagittal sinus, the right transverse sinus, the left transverse sinus, the left sigmoid sinus, the left internal jugular vein, and partially the right sigmoid sinus and right internal jugular vein using the aspiration, and stent retrieval technique as described. PLAN: Follow-up in the clinic  4 to 6 weeks post discharge. Electronically Signed   By: Luanne Bras M.D.   On: 02/17/2020 13:36   DG Chest Port 1 View  Result Date: 02/18/2020 CLINICAL DATA:  Abnormal respirations. EXAM: PORTABLE CHEST 1 VIEW COMPARISON:  02/14/2020. FINDINGS: Interim extubation  of NG tube. Right PICC line noted with tip over cavoatrial junction. Borderline cardiomegaly. Diffuse bilateral pulmonary infiltrates/edema. No pleural effusion or pneumothorax. IMPRESSION: 1. Interim extubation and removal of NG tube. Right PICC line noted with tip over cavoatrial junction. 2. Borderline cardiomegaly. 3. Diffuse bilateral pulmonary infiltrates/edema noted on today's exam. Electronically Signed   By: Marcello Moores  Register   On: 02/18/2020 05:36   EEG adult  Result Date: 02/16/2020 Lora Havens, MD     02/16/2020 10:46 AM Patient Name: Jill Clarke MRN: [id number] Epilepsy Attending: Lora Havens Referring Physician/Provider: Dr Rosalin Hawking Date: 02/16/2020 Duration: 24.48 mins Patient history: 35 year old female with altered mental status.  EEG to evaluate for seizures. Level of alertness: Comatose AEDs during EEG study: LEV Technical aspects: This EEG study was done with scalp electrodes positioned according to the 10-20 International system of electrode placement. Electrical activity was acquired at a sampling rate of '500Hz'  and reviewed with a high frequency filter of '70Hz'  and a low frequency filter of '1Hz' . EEG data were recorded continuously and digitally stored. Description: EEG showed continuous polymorphic 3 to 6 Hz theta-delta slowing. Hperventilation and photic stimulation were not performed.   ABNORMALITY -Continuous slow, generalized IMPRESSION: This study is suggestive of moderate to severe diffuse encephalopathy, nonspecific etiology.  No seizures or epileptiform discharges were seen throughout the recording. Lora Havens   MR MRV HEAD W WO CONTRAST  Result Date: 02/18/2020 CLINICAL DATA:  Stroke,  follow-up. EXAM: MRI HEAD WITH CONTRAST MRV HEAD WITHOUT AND WITH CONTRAST TECHNIQUE: Multiplanar, multiecho pulse sequences of the brain and surrounding structures were obtained with intravenous contrast. Angiographic images of the intracranial venous structures were obtained using MRV technique without and with intravenous contrast. COMPARISON:  Head CT 02/16/2020, report from thrombectomy 02/15/2020, MRI/MRV head 02/15/2020 CONTRAST:  64m GADAVIST GADOBUTROL 1 MMOL/ML IV SOLN FINDINGS: MR HEAD FINDINGS Brain: The examination is intermittently motion degraded, limiting evaluation. Most notably, there is severe motion degradation of the sagittal T1 weighted sequence and moderate motion degradation of the coronal T2 weighted sequence. The dominant acute/subacute hemorrhagic venous infarct within the left frontoparietal lobes has not significantly changed. Unchanged local mass effect with partial effacement of the left lateral ventricle. Restricted diffusion at site of an acute/early subacute hemorrhagic infarct within the right callosal splenium has subtly increased in extent. Persistent although decreased T2/FLAIR hyperintensity within the bilateral basal ganglia and thalami. Possible new punctate acute infarct within the left thalamus capsular junction (series 4, image 24). Bilateral acute/early subacute deep watershed white matter infarcts otherwise appear similar in distribution and extent. Persistent trace subarachnoid hemorrhage along the left frontoparietal convexity. No midline shift. Vascular: Expected proximal arterial flow voids. Skull and upper cervical spine: Within limitations of motion degradation, no focal marrow lesion is identified. Sinuses/Orbits: Visualized orbits show no acute finding. Fluid within the posterior ethmoid air cells and sphenoid sinuses bilaterally. Right mastoid effusion. MRV HEAD FINDINGS Flow within the superior sagittal sinus has significantly improved since the prior MRV of  02/15/2020. However, there are persistent foci of nonocclusive thrombus within this vessel. Nonocclusive thrombus within the left transverse and proximal sigmoid dural venous sinuses has increased. Persistent nonocclusive thrombus within the distal right transverse sinus. Persistent near occlusive or occlusive thrombus within the right sigmoid sinus and visualized right internal jugular vein. The degree of flow within the deep venous system is unchanged. The veins of [place] and a few additional cerebral veins overlying the bilateral frontoparietal convexities remain thrombosed (best appreciated on the  axial precontrast T1 weighted sequence). MRV impression #2 will be called to the ordering clinician or representative by the Radiologist Assistant, and communication documented in the PACS or [employer]. IMPRESSION: MRI brain: 1. Motion degraded exam. 2. 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 3. Restricted diffusion at site of an acute/subacute hemorrhagic infarct within the right callosal splenium has subtly increased in extent. 4. T2/FLAIR hyperintensity within the deep gray nuclei persists but has diminished. However, there may be a new punctate acute infarct within the left thalamocapsular junction. 5. 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 6. Persistent trace subarachnoid hemorrhage along the left frontoparietal convexity. MRV head: 1. Flow within the superior sagittal sinus has significantly improved since the prior MRV of 02/15/2020. However, there are persistent foci of nonocclusive thrombus within this vessel. 2. Nonocclusive thrombus within the left transverse and proximal sigmoid dural venous sinuses has increased. 3. Persistent nonocclusive thrombus within the right transverse sinus. 4. Persistent near occlusive or occlusive thrombus within  the right sigmoid sinus and visualized right internal jugular vein. 5. The degree of flow within the deep venous system is unchanged. 6. The veins of [place]rol[place] and a few additional cerebral veins overlying the bilateral frontoparietal convexities remain thrombosed. Electronically Signed   By: Kellie Simmering DO   On: 02/18/2020 11:38   ECHOCARDIOGRAM COMPLETE  Result Date: 02/16/2020    ECHOCARDIOGRAM REPORT   Patient Name:   Jill Clarke Date of Exam: 02/16/2020 Medical Rec #:  [id number]          Height:       65.0 in Accession #:    [id number]         Weight:       153.9 lb Date of Birth:  02-22-1985         BSA:          1.770 m Patient Age:    30 years           BP:           99/80 mmHg Patient Gender: F                  HR:           96 bpm. Exam Location:  Inpatient Procedure: 2D Echo Indications:    stroke 434.91  History:        Patient has no prior history of Echocardiogram examinations.  Sonographer:    Johny Chess Referring Phys: [place]  1. Left ventricular ejection fraction, by estimation, is 55 to 60%. The left ventricle has normal function. The left ventricle has no regional wall motion abnormalities. Left ventricular diastolic parameters were normal.  2. Right ventricular systolic function is normal. The right ventricular size is normal. There is normal pulmonary artery systolic pressure.  3. Mild mitral valve leaflet thickening and calcification. Trivial mitral valve regurgitation.  4. The aortic valve is tricuspid. There is mild thickening of the aortic valve. Aortic valve regurgitation is not visualized. No aortic stenosis is present.  5. The inferior vena cava is normal in size with <50% respiratory variability, suggesting right atrial pressure of 8 mmHg. Conclusion(s)/Recommendation(s): No intracardiac source of embolism detected on this transthoracic study. A transesophageal echocardiogram is recommended to exclude cardiac source of embolism if clinically  indicated. FINDINGS  Left Ventricle: Left ventricular ejection fraction, by estimation, is 55 to 60%.  The left ventricle has normal function. The left ventricle has no regional wall motion abnormalities. The left ventricular internal cavity size was normal in size. There is  no left ventricular hypertrophy. Left ventricular diastolic parameters were normal. Right Ventricle: The right ventricular size is normal. Right vetricular wall thickness was not well visualized. Right ventricular systolic function is normal. There is normal pulmonary artery systolic pressure. The tricuspid regurgitant velocity is 2.06 m/s, and with an assumed right atrial pressure of 3 mmHg, the estimated right ventricular systolic pressure is 16.1 mmHg. Left Atrium: Left atrial size was normal in size. Right Atrium: Right atrial size was normal in size. Pericardium: There is no evidence of pericardial effusion. Mitral Valve: The mitral valve is abnormal. There is mild thickening of the mitral valve leaflet(s). There is mild calcification of the mitral valve leaflet(s). Trivial mitral valve regurgitation. Tricuspid Valve: The tricuspid valve is normal in structure. Tricuspid valve regurgitation is trivial. Aortic Valve: The aortic valve is tricuspid. There is mild thickening of the aortic valve. Aortic valve regurgitation is not visualized. No aortic stenosis is present. Pulmonic Valve: The pulmonic valve was normal in structure. Pulmonic valve regurgitation is trivial. Aorta: The aortic root and ascending aorta are structurally normal, with no evidence of dilitation. Venous: The inferior vena cava is normal in size with less than 50% respiratory variability, suggesting right atrial pressure of 8 mmHg. IAS/Shunts: No atrial level shunt detected by color flow Doppler.  LEFT VENTRICLE PLAX 2D LVIDd:         4.40 cm  Diastology LVIDs:         3.20 cm  LV e' medial:    12.70 cm/s LV PW:         1.00 cm  LV E/e' medial:  7.3 LV IVS:        0.80 cm   LV e' lateral:   15.60 cm/s LVOT diam:     1.90 cm  LV E/e' lateral: 6.0 LV SV:         50 LV SV Index:   28 LVOT Area:     2.84 cm  RIGHT VENTRICLE             IVC RV S prime:     15.70 cm/s  IVC diam: 1.90 cm TAPSE (M-mode): 2.3 cm LEFT ATRIUM             Index       RIGHT ATRIUM           Index LA diam:        3.00 cm 1.70 cm/m  RA Area:     11.90 cm LA Vol (A2C):   32.6 ml 18.42 ml/m RA Volume:   27.70 ml  15.65 ml/m LA Vol (A4C):   32.5 ml 18.37 ml/m LA Biplane Vol: 34.1 ml 19.27 ml/m  AORTIC VALVE LVOT Vmax:   97.30 cm/s LVOT Vmean:  73.400 cm/s LVOT VTI:    0.177 m  AORTA Ao Root diam: 2.50 cm Ao Asc diam:  2.30 cm MITRAL VALVE               TRICUSPID VALVE MV Area (PHT): 4.31 cm    TR Peak grad:   17.0 mmHg MV Decel Time: 176 msec    TR Vmax:        206.00 cm/s MV E velocity: 93.00 cm/s MV A velocity: 81.80 cm/s  SHUNTS MV E/A ratio:  1.14        Systemic VTI:  0.18 m                            Systemic Diam: 1.90 cm Gwyndolyn Kaufman MD Electronically signed by Gwyndolyn Kaufman MD Signature Date/Time: 02/16/2020/2:49:18 PM    Final    IR ANGIO INTRA EXTRACRAN SEL INTERNAL CAROTID UNI R MOD SED  Result Date: 02/18/2020 INDICATION: Stuporous.  Dural venous sinus thrombosis. EXAM: 1. EMERGENT LARGE VESSEL OCCLUSION THROMBOLYSIS (POSTERIOR CIRCULATION) COMPARISON:  MRI MRV of February 15, 2020. MEDICATIONS: Ancef 2 g IV antibiotic was administered within 1 hour of the procedure. ANESTHESIA/SEDATION: General anesthesia CONTRAST:  Isovue 300 100 mL FLUOROSCOPY TIME:  Fluoroscopy Time: 96 minutes 12 seconds (3924 mGy). COMPLICATIONS: None immediate. TECHNIQUE: Following a full explanation of the procedure along with the potential associated complications, an informed witnessed consent was obtained. The risks of intracranial hemorrhage of 10%, worsening neurological deficit, ventilator dependency, death and inability to revascularize were all reviewed in detail with the patient's mother. The patient was  then put under general anesthesia by the Department of Anesthesiology at [facility]. The in situ right common femoral vein 8 [place], and in situ left common femoral arterial French sheath were exchanged over a 0.035 inch guidewire for a new sheath which were then connected to continuous heparinized saline infusion. Over a 0.035 inch Roadrunner guidewire, a 5 [place] JB 1 catheter was advanced to the aortic arch region and positioned in the right common carotid artery. Through the right common femoral vein, over a 0.035 inch Roadrunner guidewire, a 5 French catheter was administered to the right internal jugular vein to the skull base. This in turn was then exchanged over a 0.035 inch 300 cm Constance Holster exchange guidewire for an 8 French 80 cm Neuron Max sheath. The guidewire was removed. Good aspiration obtained from the hub of the Neuron Max sheath. A gentle control arteriogram performed through this demonstrates safe position of the tip of the Neuron Max sheath. This was then connected to continuous heparinized saline infusion. FINDINGS: The right common carotid arteriogram demonstrates the right external carotid artery and its major branches to be widely patent. The right internal carotid artery at the bulb to the cranial skull base is widely patent. The petrous, cavernous and supraclinoid segments are also widely patent. The right middle cerebral artery and the right anterior cerebral opacify into the capillary and venous phases. The venous phase demonstrates again significantly decreased venous congestion overlying both cerebral convexities. Patency is noted of the right transverse sinus to the right transverse sinus sigmoid sinus junction. This vein of Labbe and the posterior temporal vein are patent draining into venous channel running parallel to the to the occluded right internal jugular vein. Patency is maintained at the torcula, the left transverse sinus, the left sigmoid sinus and the left internal  jugular vein. PROCEDURE: An 071 95 cm Benchmark catheter was advanced through the Neuron Max guide sheath to the left internal jugular occluded vein. An 035 inch Roadrunner wire was first advanced without difficulty into the right sigmoid sinus and the distal right transverse sinus. However, advancement of the 6 [place] Benchmark was met with significant resistance. At this time, in a coaxial manner and with constant heparinized saline infusion over a 016 inch Fathom micro guidewire, a Marksman 027 microcatheter was advanced without difficulty to the torcula. The guidewire was removed. Control arteriogram was then performed from the proximal right transverse sinus. This confirmed brisk flow through the right transverse  and left transverse sinuses. This also demonstrated the partially occluded on the torcula. The 016 micro guidewire was reintroduced, and the torque device was advanced without difficulty into the distal 1/3 of the superior sagittal sinus and eventually into the junction of the anterior and the middle 1/3. Micro guidewire was removed. Good aspiration was obtained from the hub of the microcatheter. A gentle control arteriogram performed through this demonstrated extensive occlusion of the anterior 2/3 of the superior sagittal sinus. Through the microcatheter, a 6 mm x 40 mm Solitaire X retrieval device was advanced and deployed through the microcatheter. The microcatheter was then gently retrieved. The 6 [place] Benchmark guide catheter was then advanced to just inside the torcula having the hub at the right groin. At this time as constant aspiration was applied through the Benchmark, the retrieval device and the microcatheter were retrieved and removed. Copious amounts of clot were seen in the canister, and also in the retrieval device. Three further passes were made with a similar arrangement resulting in more clot being removed. A final pass was then made through a 071 136 cm Zoom aspiration catheter  which replaced the Benchmark catheter. This could be advanced more distally to the anterior third of the superior sagittal sinus. Constant aspiration was applied as the aspiration catheter was retrieved slowly more proximally and into the right transverse sinus. Again more clot was obtained especially in the canister. A control arteriogram performed through the diagnostic catheter in the right common carotid artery into the venous phase now demonstrated complete recanalization of the superior sagittal sinus, the right transverse sinus, the left transverse sinus, the left sigmoid sinus and the left internal jugular vein. Gentle control injection performed through the Zoom aspiration catheter in the mid right transverse sinus demonstrated significant stenosis of the right transverse sinus sigmoid sinus junction. Also noted was severely contracted right sigmoid sinus to the internal jugular vein. At this time, a 8 mm x 40 mm sterling balloon angioplasty catheter was advanced over a 0.014 inch exchange micro guidewire. Balloon angioplasty was then performed extending from the distal 1/3 of the transverse sinus on the right side, the transverse sinus sigmoid sinus junction, and the sigmoid sinus. At the end of this, a control arteriogram performed in the venous phase demonstrated marginally improved flow through the right sigmoid sinus into the now patent right internal jugular vein. However, there continued be prominent collateral just lateral to this at the level of the transverse sinus sigmoid sinus junction draining the posterior temporal vein, and the vein of Labbe. There continued be stenosis at the right transverse sinus sigmoid sinus junction partially improved. As venous grafts continued through superior sagittal sinus and transverse sinuses and the right sigmoid sinus and through a prominent collateral just into the right sigmoid sinus, it was elected to stop. 071 Zoom aspiration catheter, and the Neuron Max  sheath were removed with hemostasis achieved at the right groin venous puncture site. The left femoral sheath was then removed and hemostasis achieved with a 5 [place] ExoSeal closure device. Distal pulses remained palpable. A CT of the brain demonstrated no gross subarachnoid hemorrhage or mass effect or midline shift. The patient was left intubated and transferred to the neuro ICU. IMPRESSION: Status post endovascular complete revascularization of entire superior sagittal sinus, the right transverse sinus, the left transverse sinus, the left sigmoid sinus, the left internal jugular vein, and partially the right sigmoid sinus and right internal jugular vein using the aspiration, and stent retrieval technique as described.  PLAN: Follow-up in the clinic 4 to 6 weeks post discharge. Electronically Signed   By: Luanne Bras M.D.   On: 02/17/2020 13:36    Labs:  CBC: Recent Labs    02/16/20 0419 02/16/20 1525 02/16/20 2231 02/17/20 0400 02/18/20 0535 02/19/20 0540  WBC 15.2*  --   --  13.0* 14.4* 13.5*  HGB 7.2*   < > 8.1* 8.2* 8.0* 8.4*  HCT 23.2*   < > 25.7* 26.2* 24.5* 25.5*  PLT 480*  --   --  467* 483* 477*   < > = values in this interval not displayed.    COAGS: Recent Labs    02/12/20 0417  INR 1.2    BMP: Recent Labs    02/16/20 0419 02/16/20 1006 02/17/20 0400 02/17/20 0900 02/18/20 0535 02/18/20 1325 02/18/20 1600 02/18/20 2130 02/19/20 0540  NA 150*   < > 151*   < > 145 146* 145 145 161*  K 3.6  --  3.1*  --  3.8  --   --   --  2.8*  CL 118*  --  117*  --  111  --   --   --  128*  CO2 27  --  25  --  28  --   --   --  22  GLUCOSE 131*  --  207*  --  111*  --   --   --  82  BUN 6  --  5*  --  5*  --   --   --  <5*  CALCIUM 8.5*  --  8.7*  --  8.8*  --   --   --  7.6*  CREATININE 0.39*  --  0.39*  --  0.37*  --   --   --  0.40*  GFRNONAA >60  --  >60  --  >60  --   --   --  >60   < > = values in this interval not displayed.    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  She is s/p cerebral arteriogram with emergent mechanical thrombectomy of bilateral transverse sinuses, and partial right sigmoid sinus, and torcula.  Residual thrombus load s/p cerebral arteriogram with mechanical thrombectomy of superior sagittal sinus, straight sinus, vein of galen, bilateral IJs, and right transverse sinus 02/15/2020 by Dr. Estanislado Pandy.  Remains on heparin.  Still awaiting Speech/language therapy evaluation - not done yesterday due to lethargy limiting ability to participate.  Electronically Signed: Murrell Redden, PA-C 02/19/2020, 10:08 AM    I spent a total of 15 Minutes at the the patient's bedside AND on the patient's hospital floor or unit, greater than 50% of which was counseling/coordinating care for f/u cerebral intervention.

## 2020-02-19 NOTE — Progress Notes (Signed)
Nutrition Follow-up  DOCUMENTATION CODES:   Not applicable  INTERVENTION:   Initiate tube feeding via Cortrak tube (once in position): Osmolite 1.5 at 50 ml/h (1200 ml per day) Prosource TF 45 ml BID  Provides 1880 kcal, 97 gm protein, 912 ml free water daily   NUTRITION DIAGNOSIS:   Inadequate oral intake related to inability to eat as evidenced by NPO status. Ongoing.   GOAL:   Patient will meet greater than or equal to 90% of their needs Not met.   MONITOR:   TF tolerance  REASON FOR ASSESSMENT:   Consult,Ventilator Enteral/tube feeding initiation and management  ASSESSMENT:   Pt with PMH of asthma, DM, and sickle cell trait admitted with acute CVA and SAH due to extensive dural venous sinus thrombosis s/p emergent thrombectomy 1/21 and second thrombectomy 1/23.   Pt discussed during ICU rounds and with RN.  Per therapy pt falling asleep during treatment   1/25 extubated  1/26 failed swallow   Medications reviewed and include: colace, SSI, miralax  KCl 10 mEq x 6  Labs reviewed: Na 161, K+ 2.8   UOP: 3500 ml    Diet Order:   Diet Order            Diet NPO time specified  Diet effective now                 EDUCATION NEEDS:   No education needs have been identified at this time  Skin:  Skin Assessment: Reviewed RN Assessment  Last BM:  1/26 large  Height:   Ht Readings from Last 1 Encounters:  02/13/20 '5\' 5"'  (1.651 m)    Weight:   Wt Readings from Last 1 Encounters:  02/18/20 71 kg    Ideal Body Weight:  9.8 kg  BMI:  Body mass index is 26.05 kg/m.  Estimated Nutritional Needs:   Kcal:  1850-2000  Protein:  85-105 grams  Fluid:  >1.8 L/day   Lockie Pares., RD, LDN, CNSC See AMiON for contact information

## 2020-02-19 NOTE — Progress Notes (Addendum)
[facility] Progress Note Patient Name: Jill Clarke DOB: 1985-07-27 MRN: [id number]   Date of Service  02/19/2020  HPI/Events of Note  Patient with episodic sinus tachycardia principally associated with activity and agitation, she is no w asleep with heart rate 86-90 range.  eICU Interventions  Will observe without intervention for now, except for checking a TSH.        Kerry Kass [name] 02/19/2020, 2:33 AM

## 2020-02-19 NOTE — Progress Notes (Addendum)
STROKE TEAM PROGRESS NOTE  INTERVAL HISTORY Overnight patient serum sodium was 161, serum calcium 7.6, serum potassium 2.8.  Hypertonic saline was held and patient received calcium gluconate 1 g IV*1, KCl 10 mEq/h8 doses. Patient evaluated at bedside this morning, no family present in the room.  Patient is lethargic but awake, alert and oriented to place, age but not to person or time.  Follows simple commands, left gaze preference and still has incomplete right gaze.  Noncooperative with muscle strength testing.  Her hypertonic saline is stopped and she is started on NS IV at 75 cc.  Patient failed her swallow test again this morning plan is to repeat it again tomorrow and then place core track, order has been placed for it.  Blood pressure well controlled.  Neurological exam is unchanged from yesterday.  Vitals:   02/19/20 0800 02/19/20 0900 02/19/20 1000 02/19/20 1200  BP: (!) 151/98 (!) 148/80 (!) 161/105   Pulse: 89 91 (!) 106   Resp: 20 (!) 23 17   Temp: 98.4 F (36.9 C)   98.4 F (36.9 C)  TempSrc: Axillary   Oral  SpO2: 100% 100% 100%   Weight:      Height:       CBC:  Recent Labs  Lab 02/14/20 1111 02/15/20 1032 02/16/20 0419 02/16/20 1525 02/18/20 0535 02/19/20 0540  WBC 15.9*   < > 15.2*   < > 14.4* 13.5*  NEUTROABS 9.8*  --  10.6*  --   --   --   HGB 8.6*   < > 7.2*   < > 8.0* 8.4*  HCT 27.7*   < > 23.2*   < > 24.5* 25.5*  MCV 93.9   < > 93.5   < > 92.1 93.4  PLT 575*   < > 480*   < > 483* 477*   < > = values in this interval not displayed.   Basic Metabolic Panel:  Recent Labs  Lab 02/15/20 1612 02/15/20 2300 02/17/20 0400 02/17/20 0900 02/18/20 0535 02/18/20 1325 02/19/20 0540 02/19/20 1038  NA 149*   < > 151*   < > 145   < > 161* 142  K  --    < > 3.1*  --  3.8  --  2.8*  --   CL  --    < > 117*  --  111  --  128*  --   CO2  --    < > 25  --  28  --  22  --   GLUCOSE  --    < > 207*  --  111*  --  82  --   BUN  --    < > 5*  --  5*  --  <5*  --    CREATININE  --    < > 0.39*  --  0.37*  --  0.40*  --   CALCIUM  --    < > 8.7*  --  8.8*  --  7.6*  --   MG 1.6*  --  1.8  --  2.2  --   --   --   PHOS 2.7  --   --   --  4.1  --   --   --    < > = values in this interval not displayed.   Lipid Panel:  Recent Labs  Lab 02/13/20 0333 02/14/20 0505 02/17/20 0500  CHOL 148  --   --   TRIG  60   < > 86  HDL 28*  --   --   CHOLHDL 5.3  --   --   VLDL 12  --   --   LDLCALC 108*  --   --    < > = values in this interval not displayed.   HgbA1c:  Recent Labs  Lab 02/13/20 0333  HGBA1C 4.9   Urine Drug Screen:  No results for input(s): LABOPIA, COCAINSCRNUR, LABBENZ, AMPHETMU, THCU, LABBARB in the last 168 hours.  Alcohol Level  No results for input(s): ETH in the last 168 hours.  IMAGING past 24 hours  MR Brain 02/18/2020  1. Motion degraded exam. 2. 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 3. Restricted diffusion at site of an acute/subacute hemorrhagic infarct within the right callosal splenium has subtly increased in extent. 4. T2/FLAIR hyperintensity within the deep gray nuclei persists but has diminished. However, there may be a new punctate acute infarct within the left thalamocapsular junction. 5. 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 6. Persistent trace subarachnoid hemorrhage along the left frontoparietal convexity.  MRV Head 02/18/2020  1. Flow within the superior sagittal sinus has significantly improved since the prior MRV of 02/15/2020. However, there are persistent foci of nonocclusive thrombus within this vessel. 2. Nonocclusive thrombus within the left transverse and proximal sigmoid dural venous sinuses has increased. 3. Persistent nonocclusive thrombus within the right transverse sinus. 4. Persistent near occlusive or occlusive thrombus  within the right sigmoid sinus and visualized right internal jugular vein. 5. The degree of flow within the deep venous system is unchanged. 6. The veins of [place]rolard and a few additional cerebral veins overlying the bilateral frontoparietal convexities remain thrombosed.  Echocardiogram 02/16/2020  IMPRESSIONS   1. Left ventricular ejection fraction, by estimation, is 55 to 60%. The  left ventricle has normal function. The left ventricle has no regional  wall motion abnormalities. Left ventricular diastolic parameters were  normal.  2. Right ventricular systolic function is normal. The right ventricular  size is normal. There is normal pulmonary artery systolic pressure.  3. Mild mitral valve leaflet thickening and calcification. Trivial mitral  valve regurgitation.  4. The aortic valve is tricuspid. There is mild thickening of the aortic  valve. Aortic valve regurgitation is not visualized. No aortic stenosis is  present.  5. The inferior vena cava is normal in size with <50% respiratory  variability, suggesting right atrial pressure of 8 mmHg.   CT Head wo contrast 02/16/2020  IMPRESSION: 1. Continued interval decrease in conspicuity of scattered small volume subarachnoid hemorrhage involving both frontoparietal convexities, now only faintly visible and nearly resolved. 2. Continued interval evolution of bilateral watershed territory infarcts, most pronounced at the left frontoparietal convexity. Overall size and distribution is relatively similar. No hemorrhagic transformation by CT. 3. Persistent but improved hyperdensity about the veins of Trolard since prior CT. Likewise, the remainder of the dural sinuses also appear less dense and engorged as compared to previous. 4. No other new acute intracranial abnormality.  CT Head wo contrast 02/14/2020  IMPRESSION: Small volume bilateral frontoparietal and basilar cistern subarachnoid hemorrhage is unchanged. No new  intracranial hemorrhage.  Small bilateral cerebral infarcts are not well demonstrated on this exam.  Increased conspicuity of left cerebral edema.  Hyperdense appearance of the cortical veins, superior sagittal and right transverse sinus is less conspicuous than prior exam.  IR Thrombectomy 02/13/2020  Findings. 1.Extensive thrombus load in the TS bilaterally,the Rt SS ,RT IJV and post one third of the SSS. Large chunks of clot removed with revascularization of the TS bilaterally and partially the RT SS and the torcula. Sig decreased venous congestion of the cortical veins noted post procedure. Post CT brain No ICH or mass effect or hydrocephalus. Clinically stable with pupils 79mm sluggish.  CT Head wo contrast 02/12/2020  IMPRESSION: 1. The study is positive for a small volume of subarachnoid hemorrhage. There is no midline shift. 2. Hyperdense dural sinuses and cortical veins especially on the right in addition to a filling defect within the transverse sinus on the right is concerning for dural venous thrombosis and may be the source of the patient's subarachnoid hemorrhage. Emergent MRI/MRV is recommended for further evaluation.  MR Brain wo contrast MR Venogram Head 02/12/2020 IMPRESSION: 1. Extensive, acute dural venous sinus thrombosis involving the superior sagittal sinus into the right internal jugular vein via the dominant right transverse and sigmoid system. Both veins of Trolard are thrombosed and there is straight sinus/deep cerebral thrombosis as well. 2. Deep watershed white matter infarcts, small volume subarachnoid hemorrhage, and left more than right frontoparietal cortical swelling due to #1.  MR Brain wo contrast MR Venogram Head 02/15/2020  Brain MRI IMPRESSION: 1. Progressive left perirolandic venous infarction and petechial hemorrhage. 2. New edema in the deep gray nuclei without infarction.  Intracranial MRV: IMPRESSION: 1. Improved  flow especially in the deep venous system. Improved flow also seen in the proximal right transverse sinus. 2. Occlusive clot still seen in the superior sagittal, distal right transverse, and right sigmoid dural sinuses. No recanalization of the veins of Trolard.  EEG adult 02/12/2020  ABNORMALITY -Continue slow, generalized  IMPRESSION: This study is suggestive of moderate diffuse encephalopathy, nonspecific etiology.  At 1531, patient was noted to have right upper extremity jerking.  Concomitant EEG showed high amplitude 2 to 3 Hz rhythmic delta slowing in bitemporal region with evolution in morphology and amplitude but without definite evolution in frequency concerning for focal motor seizure.   EEG adult  02/16/2020  IMPRESSION: This study is suggestive of moderate to severe diffuse encephalopathy, nonspecific etiology.  No seizures or epileptiform discharges were seen throughout the recording.    PHYSICAL EXAM GENERAL: Well developed, young female sitting comfortably in bed, NAD Head: Normocephalic and atraumatic. EENT: No OP obstruction, normal conjunctiva.  LUNGS - Normal respiratory effort on room air CV - Tachycardic to the 130's on cardiac monitor, 2+ pedal pulses ABDOMEN - Soft, non-distended Ext: warm, well perfused Neurological:  Mental Status: Patient is extubated. Alert, awake, oriented to place.  Follow some midline commands.  One-word speech today. Cranial Nerves: Left gaze preference, still unable to do complete right gaze doll's eye reflex +.  Both pupils small sluggishly reactive.  Face is asymmetric, decreased nasolabial fold on right.  Tongue midline.  Fundi not visualized Motor: Brisk semipurposeful movements in the left upper and lower extremity to noxious stimuli, less on right extremities  sensory: Unable to access. Deep Tendon Reflexes:  Right: Upper Extremity   Left: Upper extremity   biceps (C-5 to C-6) 2/4   biceps (C-5 to C-6) 2/4 tricep (C7)  2/4    triceps (C7) 2/4 Brachioradialis (C6) 2/4  Brachioradialis (C6) 2/4  Lower Extremity Lower Extremity  quadriceps (L-2 to L-4) 2/4   quadriceps (L-2 to L-4) 2/4 Achilles (S1) 2/4   Achilles (S1) 2/4 Gait: Deferred   ASSESSMENT/PLAN [name]  is a 35 y.o. female with PMHx of asthma and sickle cell trait presented with altered mental status.  CT head revealed a small volume subarachnoid hemorrhage and hyperdense dural sinuses with filling defect concerning for dural venous thrombosis.  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  Extremely delicate situation, that is why, IR is consulted for thrombectomy and patient is with IR now.  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    MRI  and MRV 02/12/20 A). Extensive, acute dural venous sinus thrombosis involving the superior sagittal sinus into the right internal jugular vein via the dominant right transverse and sigmoid system. B).Both veins of Trolard are thrombosed and there is straight sinus/deep cerebral thrombosis as well. C). Deep watershed white matter infarcts, small volume subarachnoid hemorrhage, and left more than right frontoparietal cortical swelling due to #1.  IR Thrombectomy: 02/13/2020- Large chunks of clot removed with revascularization of the TS bilaterally and partially the RT SS and the torcula.Sig decreased venous congestion of the cortical veins noted post procedure.  MRI and MRV 02/15/20 - Improved flow especially in the deep venous system. Improved flow also seen in the proximal right transverse sinus. Occlusive clot  still seen in the superior sagittal, distal right transverse, and right sigmoid dural sinuses. No recanalization of the veins of Trolard. Progressive left perirolandic venous infarction and petechial hemorrhage. New edema in the deep gray nuclei without infarction.  IR Thrombectomy: 02/15/2020 - complete revascularization of SSS,Straight S,V of [place] and ICVs ,and RT TS. Sig focal stenosis at the RT TS/SS junction angioplastied with balloon with  improved flow into RT SS.  MRI and MRV 02/18/20: acute/subacute left frontoparietal and right CC hemorrhagic venous infarct, b/l deep watershed white matter infarcts appear similar to the prior MRI. Flow within the SSS has significantly improved since the prior MRV of 02/15/2020. However, there are persistent foci of occlusive or nonocclusive thrombus within SSS, b/l TS, b/l SS, and right IJ. Degree of flow within deep venous system unchanged, veins of Trolard remain to be thrombosed  2D Echo - EF is 55 to 60%.  LDL 108  HgbA1c 4.9   Hypercoagulable panel: mildly decreased protein S, but may be an acute phase reactant and should be rechecked as an outpatient in 6 to 8 weeks.  Repeat COVID testing: negative on 02/14/20 and on 02/11/20  VTE prophylaxis - IV heparin  No antithrombotic prior to admission, now on IV heparin. Will consider to switch to po once stabilized   On amantadine to facilitate arousal  Therapy recommendations: ?? 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  On keppra 1000 bid  Repeat EEG  02/16/20 - moderate to severe diffuse encephalopathy  Continue keppra  Respiratory failure  Intubated on sedation -> extubated 1/25  Tolerating well so far  CCM on board  Hypotension  BP on the low end  Phenylephrine started now off  . On IVF and TF . BP goal 120-160 . Long-term BP goal normotensive  Anemia - acute blood loss  Hgb - 11.0->8.2->8.6->7.2->7.0->8.2->8.0->8.2  May related to repeated procedure and IV heparin  1 unit PRBC transfusion on 02/16/2020  CBC monitoring  Hyperlipidemia  Home meds: None  LDL 108, goal < 70  Will consider statin when po access  Continue statin at discharge  Dysphagia   Off OG 02/17/20  No po access now  NPO so far  May need to consider cortrak if not passing swallow  Other Stroke Risk Factors  Sickle Cell Trait: No associated risk found due to sickle cell trait.    Other Active Problems  Asthma   Leukocytosis - WBC's - 13.3->15.9 (afebrile) >15.2  Hypokalemia - potassium - 2.9->3.0->3.6->3.1 >3.8>2.8-supplemt  Hypophosphatemia - Phosphorus - 2.0->1.3->1.9>4.1  Mastoiditis on MRI.  Unsure if this is contributing to venous sinus thrombosis but will start antibiotics  Hospital day # 7   02/19/2020 1:19 PM  ATTENDING NOTE: I reviewed above note and agree with the assessment and plan. Pt was seen and examined.   RN at the bedside. Patient lying in bed, lethargic but eyes open, awake alert, slow responding to questions however orientated to place, age, but not to time. Able to repeat simple sentences, however did not name. Follows simple commands. Left gaze preference, still has incomplete right gaze, blinking to visual threat bilaterally, still has mild right facial droop, tongue midline. Not cooperative with muscle strength testing but with painful stimulation, BUEs able to briefly against gravity, left stronger than right. BLEs  with pain withdraw briskly with left stronger than right.Sensation, coordinationnot cooperativeand gait not tested.  MRI showed continued left left frontoparietal and right CC hemorrhagic venous infarct, b/l deep watershed white matter infarcts. MRA showed flow within the SSS has significantly improved but other venous sinus involved have largely not changed. However, pt clinically much improved. Will continue current management. Na 161, will d/c 3% saline and switch to NS. Still not passing swallow, will consider cortrak in am. Pt had hypoglycemia this pm, changed IVF to D5NS. Will continue to monitor. PT/OT is working with her.  Jill Hawking, MD PhD Stroke Neurology 02/19/2020 1:19 PM  This patient is critically ill due to extensive venous infarct, SAH and venous occlusion/near occlusion, cerebral edema, seizure, dysphagia and at significant risk of neurological worsening, death form venous infarct extension, brain herniation, status epilepticus, aspiration, heart failure. This patient's care requires constant monitoring of vital signs, hemodynamics, respiratory and cardiac monitoring, review of multiple databases, neurological assessment, discussion with family, other specialists and medical decision making of high complexity. I spent 35 minutes of neurocritical care time in the care of this patient.   To contact Stroke Continuity provider, please refer to [email or web address] After hours, contact General Neurology

## 2020-02-19 NOTE — Progress Notes (Signed)
ANTICOAGULATION CONSULT NOTE  Pharmacy Consult:  IV Heparin Indication: Dural venous sinus thrombosis, S/P thrombectomy  Labs: Recent Labs    02/17/20 0400 02/17/20 0500 02/18/20 0535 02/18/20 1327 02/18/20 2130 02/19/20 0500 02/19/20 0540  HGB 8.2*  --  8.0*  --   --   --  8.4*  HCT 26.2*  --  24.5*  --   --   --  25.5*  PLT 467*  --  483*  --   --   --  477*  HEPARINUNFRC  --    < >  --  0.24* 0.23* 0.43  --   CREATININE 0.39*  --  0.37*  --   --   --  0.40*   < > = values in this interval not displayed.   Assessment: 35 yr old female with AMS, found to have a small volume subarachnoid hemorrhage and extensive dural venous thrombosis. Pharmacy was consulted to manage IV heparin per stroke protocol. She is S/P thrombectomy in IR on 1/21 and venogram, complete revascularization, angioplasty in IR on 1/23. Heparin was resumed post-procedure.  Heparin level is sub-therapeutic, essentially unchanged after rate adjustment.  No issue with heparin infusion per RN.  No bleeding reported.  1/27 AM update:  Heparin level therapeutic x 1 after rate increase  Goal of Therapy:  Heparin level 0.3-0.5 units/ml Monitor platelets by anticoagulation protocol: Yes   Plan:  Cont heparin drip at 1200 units/hr Re-check heparin level in 6-8 horus Monitor daily CBC, s/sx bleeding  Narda Bonds, PharmD, BCPS Clinical Pharmacist Phone: [phone]

## 2020-02-19 NOTE — Evaluation (Signed)
Speech Language Pathology Evaluation Patient Details Name: Jill Clarke MRN: [id number] DOB: 11-04-1985 Today's Date: 02/19/2020 Time: 3536-1443 SLP Time Calculation (min) (ACUTE ONLY): 8 min  Problem List:  Patient Active Problem List   Diagnosis Date Noted  . 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 Respiratory failure ([place])   . Acute cerebral venous sinus thrombosis 02/12/2020  . Unspecified contraceptive management 10/02/2011  . Unspecified high-risk pregnancy 01/27/2011  . Low birth weight 01/23/2011  . Asthma 01/23/2011  . Short interval between pregnancies complicating pregnancy, antepartum 01/23/2011  . Nausea and vomiting in pregnancy 12/28/2010   Past Medical History:  Past Medical History:  Diagnosis Date  . Asthma    uses inhaler prn  . Diabetes mellitus without complication ([place])   . Sickle cell trait ([place])   . Unspecified high-risk pregnancy 01/27/2011   Past Surgical History:  Past Surgical History:  Procedure Laterality Date  . IR ANGIO INTRA EXTRACRAN SEL COM CAROTID INNOMINATE BILAT MOD SED  02/13/2020  . IR ANGIO INTRA EXTRACRAN SEL COM CAROTID INNOMINATE UNI R MOD SED  02/13/2020  . IR ANGIO INTRA EXTRACRAN SEL INTERNAL CAROTID UNI L MOD SED  02/13/2020  . IR ANGIO INTRA EXTRACRAN SEL INTERNAL CAROTID UNI R MOD SED  02/15/2020  . IR ANGIO VERTEBRAL SEL VERTEBRAL BILAT MOD SED  02/13/2020  . IR CT HEAD LTD  02/13/2020  . IR CT HEAD LTD  02/15/2020  . IR THROMBECT VENO MECH MOD SED  02/13/2020  . IR THROMBECT VENO MECH REPT MOD SED  02/15/2020  . RADIOLOGY WITH ANESTHESIA N/A 02/13/2020   Procedure: IR WITH ANESTHESIA;  Surgeon: Luanne Bras, MD;  Location: [facility];  Service: Radiology;  Laterality: N/A;  . RADIOLOGY WITH ANESTHESIA N/A 02/15/2020   Procedure: IR WITH ANESTHESIA;  Surgeon: Radiologist, Medication, MD;  Location: [facility];   Service: Radiology;  Laterality: N/A;  . TONSILLECTOMY    . WISDOM TOOTH EXTRACTION     HPI:  35 yo presented with AMS. MRI >  left frontoparietal hemorrhagic venous infarct (no significant changed 02/15/2020.),  hemorrhagic infarct within the right callosal, possible new punctate acute infarct within the left thalamocapsular junction, bilateral deep watershed white matter infarcts, persistent trace subarachnoid hemorrhage along the left  frontoparietal convexity. Intubated 1/20-1/25. CXR Diffuse bilateral pulmonary infiltrates/edema. PMH: DM, sickle cell trait, asthma.   Assessment / Plan / Recommendation Clinical Impression  Pt was drowsy but awake for speech-cognitive assessment with flat affect. She responded intermittently with delayed processing requirng repetition and additional time. She responded in one word verbalizations only several times in a hoarse and low intensity. Current respiratory strength and endurance prevented her from achieving cough or throat clear. Jill Clarke was oriented to name and place, and stated children's name (unintelligible d/t volume). She followed one step commands inconsistently and suspect from cognitive deficits versus comprehension - will continue diagnostic treatment as her strokes were bilateral). ST intervention for facilitation of cognitive-linguistic abilities.    SLP Assessment  SLP Recommendation/Assessment: Patient needs continued Speech Lanaguage Pathology Services SLP Visit Diagnosis: Cognitive communication deficit (R41.841)    Follow Up Recommendations  Inpatient Rehab    Frequency and Duration min 2x/week  2 weeks      SLP Evaluation Cognition  Overall Cognitive Status: Impaired/Different from baseline Arousal/Alertness:  (awake but "foggy") Orientation Level: Oriented to person;Oriented to place;Disoriented to situation;Disoriented to time Attention: Sustained Sustained Attention: Impaired  Sustained Attention Impairment:  (will continue  to assess) Awareness: Impaired Awareness Impairment: Intellectual impairment Problem Solving: Impaired Problem Solving Impairment: Functional basic Behaviors:  (flat affect) Safety/Judgment: Impaired       Comprehension  Auditory Comprehension Overall Auditory Comprehension: Impaired (no response to most- followed some basic-suspect mostly cog based- diagnostic tx) Commands:  (see above comment) Interfering Components: Attention;Processing speed EffectiveTechniques: Extra processing time Visual Recognition/Discrimination Discrimination: Not tested Reading Comprehension Reading Status: Not tested    Expression Expression Primary Mode of Expression: Verbal Verbal Expression Overall Verbal Expression: Impaired Initiation: Impaired (suspect cognitive) Level of Generative/Spontaneous Verbalization: Word Repetition:  (NT) Naming: Not tested Pragmatics: Impairment Impairments: Abnormal affect;Eye contact Written Expression Dominant Hand:  (unknown) Written Expression: Not tested   Oral / Motor  Oral Motor/Sensory Function Overall Oral Motor/Sensory Function: Moderate impairment (generalized facial weakness) Motor Speech Overall Motor Speech: Impaired Respiration: Impaired Level of Impairment: Phrase Phonation: Low vocal intensity;Hoarse Resonance:  (will assess) Articulation: Within functional limitis Intelligibility: Intelligibility reduced Word: 50-74% accurate Motor Planning:  (continue to assess)   GO                    Jill Clarke 02/19/2020, 12:23 PM  Jill Clarke.Ed Risk analyst [phone] Office [phone]

## 2020-02-19 NOTE — Progress Notes (Signed)
CRITICAL VALUE ALERT  Critical Value:  CBG 64  Date & Time Notied:  02/19/2020 1640  Provider Notified: Dr. Erlinda Hong  Orders Received/Actions taken: half amp D50 given. Orders entered for D5NS at 38

## 2020-02-19 NOTE — Progress Notes (Signed)
[place] Progress Note Patient Name: Jill Clarke DOB: [birth year] MRN: [id number]   Date of Service  02/19/2020  HPI/Events of Note  Serum sodium 161, serum calcium 7.6, serum K+ 2.8,, creatinine normal.  eICU Interventions  Hypertonic saline infusion held, Calcium gluconate 1 gm iv x 1, KCL 10 meq  / hour x 8 doses.        Frederik Pear 02/19/2020, 6:46 AM

## 2020-02-19 NOTE — Progress Notes (Signed)
ANTICOAGULATION CONSULT NOTE  Pharmacy Consult:  IV Heparin Indication: Dural venous sinus thrombosis, S/P thrombectomy  Labs: Recent Labs    02/17/20 0400 02/17/20 0500 02/18/20 0535 02/18/20 1327 02/18/20 2130 02/19/20 0500 02/19/20 0540 02/19/20 1215  HGB 8.2*  --  8.0*  --   --   --  8.4*  --   HCT 26.2*  --  24.5*  --   --   --  25.5*  --   PLT 467*  --  483*  --   --   --  477*  --   HEPARINUNFRC  --    < >  --    < > 0.23* 0.43  --  0.48  CREATININE 0.39*  --  0.37*  --   --   --  0.40*  --    < > = values in this interval not displayed.   Assessment: 35 yr old female with AMS, found to have a small volume subarachnoid hemorrhage and extensive dural venous thrombosis. Pharmacy was consulted to manage IV heparin per stroke protocol. She is S/P thrombectomy in IR on 1/21 and venogram, complete revascularization, angioplasty in IR on 1/23. Heparin was resumed post-procedure.  Heparin level remains therapeutic. CBC stable. No active bleed issues reported.  Goal of Therapy:  Heparin level 0.3-0.5 units/ml Monitor platelets by anticoagulation protocol: Yes   Plan:  Continue heparin drip at 1200 units/hr Monitor daily heparin level and CBC, s/sx bleeding   Arturo Morton, PharmD, BCPS Please check AMION for all [place] contact numbers Clinical Pharmacist 02/19/2020 1:32 PM

## 2020-02-19 NOTE — Progress Notes (Signed)
CRITICAL VALUE ALERT  Critical Value:  Sodium 161  Date & Time Notied: 02/19/2020 0610  Provider Notified: CCM elink  Orders Received/Actions taken:awaiting orders

## 2020-02-20 ENCOUNTER — Encounter (HOSPITAL_COMMUNITY): Payer: Self-pay

## 2020-02-20 LAB — CBC
HCT: 29.8 % — ABNORMAL LOW (ref 36.0–46.0)
Hemoglobin: 9.4 g/dL — ABNORMAL LOW (ref 12.0–15.0)
MCH: 29.5 pg (ref 26.0–34.0)
MCHC: 31.5 g/dL (ref 30.0–36.0)
MCV: 93.4 fL (ref 80.0–100.0)
Platelets: 545 10*3/uL — ABNORMAL HIGH (ref 150–400)
RBC: 3.19 MIL/uL — ABNORMAL LOW (ref 3.87–5.11)
RDW: 14.2 % (ref 11.5–15.5)
WBC: 13.9 10*3/uL — ABNORMAL HIGH (ref 4.0–10.5)
nRBC: 0.1 % (ref 0.0–0.2)

## 2020-02-20 LAB — BASIC METABOLIC PANEL
Anion gap: 9 (ref 5–15)
BUN: <5 mg/dL — ABNORMAL LOW (ref 6–20)
CO2: 25 mmol/L (ref 22–32)
Calcium: 9.2 mg/dL (ref 8.9–10.3)
Chloride: 103 mmol/L (ref 98–111)
Creatinine, Ser: 0.41 mg/dL — ABNORMAL LOW (ref 0.44–1.00)
GFR, Estimated: >60 mL/min (ref >60)
Glucose, Bld: 124 mg/dL — ABNORMAL HIGH (ref 70–99)
Potassium: 3.4 mmol/L — ABNORMAL LOW (ref 3.5–5.1)
Sodium: 137 mmol/L (ref 135–145)

## 2020-02-20 LAB — GLUCOSE, CAPILLARY
Glucose-Capillary: 110 mg/dL — ABNORMAL HIGH (ref 70–99)
Glucose-Capillary: 114 mg/dL — ABNORMAL HIGH (ref 70–99)
Glucose-Capillary: 114 mg/dL — ABNORMAL HIGH (ref 70–99)
Glucose-Capillary: 115 mg/dL — ABNORMAL HIGH (ref 70–99)
Glucose-Capillary: 116 mg/dL — ABNORMAL HIGH (ref 70–99)

## 2020-02-20 LAB — HEPARIN LEVEL (UNFRACTIONATED): Heparin Unfractionated: 0.36 IU/mL (ref 0.30–0.70)

## 2020-02-20 LAB — FACTOR 5 LEIDEN

## 2020-02-20 MED ORDER — DOCUSATE SODIUM 50 MG/5ML PO LIQD
100.0000 mg | Freq: Two times a day (BID) | ORAL | Status: DC
  Administered 2020-02-20 – 2020-02-27 (×13): 100 mg via ORAL
  Filled 2020-02-20 (×14): qty 10

## 2020-02-20 MED ORDER — AMANTADINE HCL 50 MG/5ML PO SOLN
100.0000 mg | Freq: Two times a day (BID) | ORAL | Status: DC
Start: 2020-02-20 — End: 2020-02-28
  Administered 2020-02-20 – 2020-02-28 (×16): 100 mg via ORAL
  Filled 2020-02-20 (×17): qty 10

## 2020-02-20 MED ORDER — POLYETHYLENE GLYCOL 3350 17 G PO PACK
17.0000 g | PACK | Freq: Every day | ORAL | Status: DC
  Administered 2020-02-21 – 2020-02-27 (×5): 17 g via ORAL
  Filled 2020-02-20 (×6): qty 1

## 2020-02-20 MED ORDER — CHLORHEXIDINE GLUCONATE 0.12 % MT SOLN
15.0000 mL | Freq: Two times a day (BID) | OROMUCOSAL | Status: DC
  Administered 2020-02-20 – 2020-02-27 (×15): 15 mL via OROMUCOSAL
  Filled 2020-02-20 (×15): qty 15

## 2020-02-20 MED ORDER — ORAL CARE MOUTH RINSE
15.0000 mL | Freq: Two times a day (BID) | OROMUCOSAL | Status: DC
  Administered 2020-02-20 – 2020-02-27 (×12): 15 mL via OROMUCOSAL

## 2020-02-20 MED ORDER — POTASSIUM CHLORIDE 10 MEQ/50ML IV SOLN
10.0000 meq | INTRAVENOUS | Status: AC
  Administered 2020-02-20 (×2): 10 meq via INTRAVENOUS
  Filled 2020-02-20 (×2): qty 50

## 2020-02-20 MED ORDER — ENSURE ENLIVE PO LIQD
237.0000 mL | Freq: Two times a day (BID) | ORAL | Status: DC
  Administered 2020-02-21 – 2020-02-28 (×12): 237 mL via ORAL

## 2020-02-20 NOTE — Progress Notes (Signed)
Inpatient Rehab Admissions Coordinator Note:   Per updated PT recommendations, pt was screened for CIR candidacy by Shann Medal, PT, DPT.  At this time we are recommending a CIR consult and I will place an order per our protocol.  Please contact me with questions.   Shann Medal, PT, DPT [phone] 02/20/20 4:17 PM

## 2020-02-20 NOTE — Progress Notes (Signed)
ANTICOAGULATION CONSULT NOTE  Pharmacy Consult:  IV Heparin Indication: Dural venous sinus thrombosis, S/P thrombectomy  Labs: Recent Labs    02/18/20 0535 02/18/20 1327 02/19/20 0500 02/19/20 0540 02/19/20 1215 02/20/20 0548  HGB 8.0*  --   --  8.4*  --  9.4*  HCT 24.5*  --   --  25.5*  --  29.8*  PLT 483*  --   --  477*  --  545*  HEPARINUNFRC  --    < > 0.43  --  0.48 0.36  CREATININE 0.37*  --   --  0.40*  --  0.41*   < > = values in this interval not displayed.   Assessment: 35 yr old female with AMS, found to have a small volume subarachnoid hemorrhage and extensive dural venous thrombosis. Pharmacy was consulted to manage IV heparin per stroke protocol. She is S/P thrombectomy in IR on 1/21 and venogram, complete revascularization, angioplasty in IR on 1/23. Heparin was resumed post-procedure.  Heparin level remains therapeutic. CBC stable. No active bleed issues reported.  Goal of Therapy:  Heparin level 0.3-0.5 units/ml Monitor platelets by anticoagulation protocol: Yes   Plan:  Continue heparin drip at 1200 units/hr Monitor daily heparin level and CBC, s/sx bleeding   Arturo Morton, PharmD, BCPS Please check AMION for all [place] contact numbers Clinical Pharmacist 02/20/2020 9:35 AM

## 2020-02-20 NOTE — Progress Notes (Signed)
Occupational Therapy Treatment Patient Details Name: Jill Clarke MRN: [id number] DOB: 1985/11/22 Today's Date: 02/20/2020    History of present illness 35 y.o. female with a history significant for asthma and sickle cell trait presented to ED 1/19 for N/V and discharged home. Returned 1/20 due to AMS. CT Head revealing a small volume subarachnoid hemorrhage and hyperdense dural sinuses with a filling defect concerning for dural venous thrombosis. 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 Intubated 1/21-1/25.   OT comments  Pt demonstrates total +2 max (A) transfer this session with strong R inattention throughout session. Pt following 1 step commands. Pt requires (A) for static sitting balance. Pt report "I am weak" Pt showing some awareness. Pt works for [employer] per patient report. Recommendation CIR at this time.    Follow Up Recommendations  CIR    Equipment Recommendations  Wheelchair (measurements OT);Wheelchair cushion (measurements OT);Hospital bed;Other (comment);3 in 1 bedside commode    Recommendations for Other Services Rehab consult    Precautions / Restrictions Precautions Precautions: Other (comment) Precaution Comments: multiple lines       Mobility Bed Mobility Overal bed mobility: Needs Assistance Bed Mobility: Supine to Sit     Supine to sit: Total assist;HOB elevated        Transfers Overall transfer level: Needs assistance Equipment used: 2 person hand held assist Transfers: Sit to/from Stand;Stand Pivot Transfers Sit to Stand: Max assist;+2 physical assistance Stand pivot transfers: Max assist;+2 physical assistance       General transfer comment: pt requires R knee block due to mild R knee buckling. transferring to left side as pt demonstrates some R neglect    Balance Overall balance assessment: Needs assistance Sitting-balance support: Bilateral upper extremity  supported;Single extremity supported;No upper extremity supported;Feet supported Sitting balance-Leahy Scale: Zero Sitting balance - Comments: modA with cues, maxA intermittently due to impaired attention to balance Postural control: Left lateral lean;Right lateral lean;Posterior lean Standing balance support: Bilateral upper extremity supported Standing balance-Leahy Scale: Zero Standing balance comment: maxA x2                           ADL either performed or assessed with clinical judgement   ADL Overall ADL's : Needs assistance/impaired Eating/Feeding: NPO   Grooming: Oral care;Maximal assistance;Sitting Grooming Details (indicate cue type and reason): able to sustain grasp with wash cloth over yonker tooth brush Upper Body Bathing: Maximal assistance   Lower Body Bathing: Total assistance   Upper Body Dressing : Maximal assistance   Lower Body Dressing: Total assistance   Toilet Transfer: +2 for physical assistance;Maximal assistance             General ADL Comments: pt with poor recall of information provided but when questiosn about long term memory making sounds and faces to indicate annoyance with questioning. Pt very strong response to PT cold hands and verbalized so several times. this was information she sustained after phsycial input.     Vision       Perception     Praxis      Cognition Arousal/Alertness: Awake/alert Behavior During Therapy: WFL for tasks assessed/performed Overall Cognitive Status: Impaired/Different from baseline Area of Impairment: Orientation;Attention;Memory;Following commands;Safety/judgement;Awareness;Problem solving                 Orientation Level: Disoriented to;Time;Situation Current Attention Level: Sustained Memory: Decreased short-term memory;Decreased recall of precautions Following Commands: Follows one step commands with increased  time Safety/Judgement: Decreased awareness of safety;Decreased  awareness of deficits Awareness: Intellectual Problem Solving: Slow processing;Decreased initiation;Difficulty sequencing;Requires verbal cues;Requires tactile cues General Comments: poor recall of information provided during session. pt appears shocked to learn that she has been in hospital 8 days. pt does state she is in acute.        Exercises Other Exercises Other Exercises: cues to visually attend to the R side. pt with strong L gaze preference   Shoulder Instructions       General Comments VSS    Pertinent Vitals/ Pain       Pain Assessment: Faces Faces Pain Scale: No hurt  Home Living                                          Prior Functioning/Environment              Frequency  Min 2X/week        Progress Toward Goals  OT Goals(current goals can now be found in the care plan section)  Progress towards OT goals: Progressing toward goals  Acute Rehab OT Goals Patient Stated Goal: mickey mouse with kids OT Goal Formulation: Patient unable to participate in goal setting Potential to Achieve Goals: Fair ADL Goals Additional ADL Goal #1: pt will tolerate eob sitting 5 minutes with stable VSS (met) Additional ADL Goal #2: Pt will complete 2 step command 50% of attempts Additional ADL Goal #3: pt will demonstrate sustained attention for adl  Plan Discharge plan needs to be updated    Co-evaluation    PT/OT/SLP Co-Evaluation/Treatment: Yes Reason for Co-Treatment: Complexity of the patient's impairments (multi-system involvement);For patient/therapist safety;To address functional/ADL transfers   OT goals addressed during session: ADL's and self-care;Proper use of Adaptive equipment and DME;Strengthening/ROM      AM-PAC OT "6 Clicks" Daily Activity     Outcome Measure   Help from another person eating meals?: A Lot Help from another person taking care of personal grooming?: A Lot Help from another person toileting, which includes using  toliet, bedpan, or urinal?: A Lot Help from another person bathing (including washing, rinsing, drying)?: A Lot Help from another person to put on and taking off regular upper body clothing?: A Lot Help from another person to put on and taking off regular lower body clothing?: A Lot 6 Click Score: 12    End of Session Equipment Utilized During Treatment: Oxygen  OT Visit Diagnosis: Unsteadiness on feet (R26.81);Muscle weakness (generalized) (M62.81)   Activity Tolerance Patient tolerated treatment well   Patient Left in chair;with call bell/phone within reach;with chair alarm set   Nurse Communication Mobility status;Precautions        Time: 1610-9604 OT Time Calculation (min): 26 min  Charges: OT General Charges $OT Visit: 1 Visit OT Treatments $Self Care/Home Management : 8-22 mins   Brynn, OTR/L  Acute Rehabilitation Services Pager: [phone] Office: [phone] .    Jeri Modena 02/20/2020, 4:20 PM

## 2020-02-20 NOTE — Progress Notes (Signed)
  Speech Language Pathology Treatment: Dysphagia;Cognitive-Linquistic  Patient Details Name: Jill Clarke MRN: [id number] DOB: 1985/12/26 Today's Date: 02/20/2020 Time: 1209-1232 SLP Time Calculation (min) (ACUTE ONLY): 23 min  Assessment / Plan / Recommendation Clinical Impression  Mild-moderate improvements in pt's attention, processing time noted today. Treatment for dysphagia and cognition while up in chair. Pt continues to hold oral secretions intermittently and had saliva in oral cavity on SLP arrival. She followed commands during po trials needing hand over hand to initiate self feeding then coordinate to mouth and consumed approximately 3 oz water (not consecutively). Biting straw inconsistently but self correcting given additional time. Manipulated puree and solid texture without overt difficulty during her trial. With cognitive deficits it is recommended she initiate puree (pt in agreement), thin liquids, straws allowed and pills whole in puree, full supervision for safety and facilitate feeding and check oral cavity for potential pocketing, limit distractions.  She is unaware of her diagnosis, impairments and needs significant assist for verbal and functional problem solving, attention and becomes easily distracted. Would benefit from continued intervention toward cognition and dysphagia goals.   HPI HPI: 35 yo presented with AMS. MRI >  left frontoparietal hemorrhagic venous infarct (no significant changed 02/15/2020.),  hemorrhagic infarct within the right callosal, possible new punctate acute infarct within the left thalamocapsular junction, bilateral deep watershed white matter infarcts, persistent trace subarachnoid hemorrhage along the left  frontoparietal convexity. Intubated 1/20-1/25. CXR Diffuse bilateral pulmonary infiltrates/edema. PMH: DM, sickle cell trait, asthma.      SLP Plan  Continue with current plan of care       Recommendations  Diet recommendations:  Dysphagia 1 (puree);Thin liquid Liquids provided via: Cup;Straw Medication Administration: Whole meds with puree Supervision: Staff to assist with self feeding;Full supervision/cueing for compensatory strategies Compensations: Minimize environmental distractions;Slow rate;Small sips/bites;Lingual sweep for clearance of pocketing Postural Changes and/or Swallow Maneuvers: Seated upright 90 degrees                Oral Care Recommendations: Oral care QID Follow up Recommendations: Inpatient Rehab SLP Visit Diagnosis: Cognitive communication deficit (R41.841);Dysphagia, unspecified (R13.10) Plan: Continue with current plan of care                      Houston Siren 02/20/2020, 2:16 PM  Orbie Pyo Colvin Caroli.Ed Risk analyst [phone] Office [phone]

## 2020-02-20 NOTE — Progress Notes (Addendum)
Physical Therapy Treatment Patient Details Name: Jill Clarke MRN: [id number] DOB: 10-12-1985 Today's Date: 02/20/2020    History of Present Illness 35 y.o. female with a history significant for asthma and sickle cell trait presented to ED 1/19 for N/V and discharged home. Returned 1/20 due to AMS. CT Head revealing a small volume subarachnoid hemorrhage and hyperdense dural sinuses with a filling defect concerning for dural venous thrombosis. 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 Intubated 1/21-1/25.    PT Comments    Pt tolerates treatment well, more alert and better able to participate in session. Pt continues to demonstrate significant weakness with R sided weakness more significant than L. Pt with inattention to R side throughout session and requires movement of RUE to midline to produce AROM of R fingers and wrist. Pt continues to require significant physical assistance for all functional mobility tasks and to maintain balance. Pt will benefit from continued aggressive mobilization and PT POC to improve mobility quality and reduce caregiver burden. PT updates recommendations to CIR as the pt was much more alert and participatory with PT this session.  Follow Up Recommendations  CIR     Equipment Recommendations  Wheelchair (measurements PT);Wheelchair cushion (measurements PT);Hospital bed;Other (comment) (mechanical lift)    Recommendations for Other Services       Precautions / Restrictions Precautions Precautions: Other (comment) Precaution Comments: multiple lines Restrictions Weight Bearing Restrictions: No    Mobility  Bed Mobility Overal bed mobility: Needs Assistance Bed Mobility: Supine to Sit     Supine to sit: Total assist;HOB elevated        Transfers Overall transfer level: Needs assistance Equipment used: 2 person hand held assist Transfers: Sit to/from Stand;Stand Pivot  Transfers Sit to Stand: Max assist;+2 physical assistance Stand pivot transfers: Max assist;+2 physical assistance       General transfer comment: pt requires R knee block due to mild R knee buckling. transferring to left side as pt demonstrates some R neglect  Ambulation/Gait                 Stairs             Wheelchair Mobility    Modified Rankin (Stroke Patients Only) Modified Rankin (Stroke Patients Only) Pre-Morbid Rankin Score: No symptoms Modified Rankin: Severe disability     Balance Overall balance assessment: Needs assistance Sitting-balance support: Bilateral upper extremity supported;Single extremity supported;No upper extremity supported;Feet supported Sitting balance-Leahy Scale: Zero Sitting balance - Comments: modA with cues, maxA intermittently due to impaired attention to balance Postural control: Left lateral lean;Right lateral lean;Posterior lean Standing balance support: Bilateral upper extremity supported Standing balance-Leahy Scale: Zero Standing balance comment: maxA x2                            Cognition Arousal/Alertness: Awake/alert Behavior During Therapy: WFL for tasks assessed/performed Overall Cognitive Status: Impaired/Different from baseline Area of Impairment: Orientation;Attention;Memory;Following commands;Safety/judgement;Awareness;Problem solving                 Orientation Level: Disoriented to;Time;Situation Current Attention Level: Sustained Memory: Decreased short-term memory;Decreased recall of precautions Following Commands: Follows one step commands with increased time Safety/Judgement: Decreased awareness of safety;Decreased awareness of deficits Awareness: Intellectual Problem Solving: Slow processing;Decreased initiation;Difficulty sequencing;Requires verbal cues;Requires tactile cues        Exercises      General Comments General comments (skin integrity, edema, etc.): VSS on  RA, BP  in supine 163/72, after transfer BP 171/89      Pertinent Vitals/Pain Pain Assessment: Faces Faces Pain Scale: No hurt    Home Living                      Prior Function            PT Goals (current goals can now be found in the care plan section) Acute Rehab PT Goals Patient Stated Goal: unable Progress towards PT goals: Progressing toward goals    Frequency    Min 3X/week (may benefit from 4x/wk if able to remain alert more throughout day)      PT Plan Discharge plan needs to be updated    Co-evaluation PT/OT/SLP Co-Evaluation/Treatment: Yes Reason for Co-Treatment: Complexity of the patient's impairments (multi-system involvement);For patient/therapist safety;Necessary to address cognition/behavior during functional activity;To address functional/ADL transfers PT goals addressed during session: Mobility/safety with mobility;Balance;Strengthening/ROM        AM-PAC PT "6 Clicks" Mobility   Outcome Measure  Help needed turning from your back to your side while in a flat bed without using bedrails?: Total Help needed moving from lying on your back to sitting on the side of a flat bed without using bedrails?: Total Help needed moving to and from a bed to a chair (including a wheelchair)?: A Lot Help needed standing up from a chair using your arms (e.g., wheelchair or bedside chair)?: A Lot Help needed to walk in hospital room?: Total Help needed climbing 3-5 steps with a railing? : Total 6 Click Score: 8    End of Session Equipment Utilized During Treatment: Gait belt Activity Tolerance: Patient tolerated treatment well Patient left: in chair;with call bell/phone within reach;with chair alarm set Nurse Communication: Mobility status;Need for lift equipment PT Visit Diagnosis: Other symptoms and signs involving the nervous system (E08.144)     Time: 8185-6314 PT Time Calculation (min) (ACUTE ONLY): 25 min  Charges:  $Therapeutic Activity: 8-22  mins                     Zenaida Niece, PT, DPT Acute Rehabilitation Pager: [phone]    Zenaida Niece 02/20/2020, 10:26 AM

## 2020-02-20 NOTE — Progress Notes (Signed)
Nutrition Follow-up  DOCUMENTATION CODES:   Not applicable  INTERVENTION:   Ensure Enlive po BID, each supplement provides 350 kcal and 20 grams of protein  If pt unable to consume adequate nutrition recommend place Cortrak tube  Osmolite 1.5 at 50 ml/h (1200 ml per day) Prosource TF 45 ml BID  Provides 1880 kcal, 97 gm protein, 912 ml free water daily   NUTRITION DIAGNOSIS:   Inadequate oral intake related to acute illness as evidenced by meal completion < 50%. Ongoing.   GOAL:   Patient will meet greater than or equal to 90% of their needs Not met.   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   Pt discussed during ICU rounds and with RN.  Cortrak placement deferred as diet advanced to dysphagia 1 with thin liquids.  Spoke with SLP pt without signs of dysphagia but did pocket secretions intermittently and becomes easily distracted. Concern for patient's ability to consume adequate nutrition at this time will need to closely monitor.   1/25 extubated  1/26 failed swallow  1/28 diet advanced to Dysphagia 1 with thin liquids  Medications reviewed and include: colace, miralax  D5 @ 75 ml/hr Labs reviewed: K+ 3.4    Diet Order:   Diet Order            DIET - DYS 1 Room service appropriate? No; Fluid consistency: Thin  Diet effective now                 EDUCATION NEEDS:   No education needs have been identified at this time  Skin:  Skin Assessment: Reviewed RN Assessment  Last BM:  1/26  Height:   Ht Readings from Last 1 Encounters:  02/13/20 '5\' 5"'  (1.651 m)    Weight:   Wt Readings from Last 1 Encounters:  02/20/20 62.3 kg    Ideal Body Weight:   9.8 kg  BMI:  Body mass index is 22.86 kg/m.  Estimated Nutritional Needs:   Kcal:  1850-2000  Protein:  85-105 grams  Fluid:  >1.8 L/day   Lockie Pares., RD, LDN, CNSC See AMiON for contact information

## 2020-02-20 NOTE — Progress Notes (Signed)
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 Estanislado Pandy; with residual thrombus load s/p cerebral arteriogram with mechanical thrombectomy of superior sagittal sinus, straight sinus, vein of galen, bilateral IJs, and right transverse sinus via right (venous) and left (arterial) femoral approach 02/15/2020 by Dr. Estanislado Pandy. Patient awake and alert, moving around in bed. RN at bedside. Demonstrates word finding difficulty but able to identify one word objects (pen, fingers). Demonstrates right eye visual field deficit. Can spontaneously move left side and RLE but no spontaneous movements of RUE. Right (venous) and left (arterial) femoral puncture sites c/d/i.   Allergies: Patient has no known allergies.  Medications: Prior to Admission medications   Not on File     Vital Signs: BP (!) 167/88   Pulse 81   Temp 98.3 F (36.8 C) (Oral)   Resp (!) 21   Ht 5\' 5"  (1.651 m)   Wt 137 lb 5.6 oz (62.3 kg)   LMP 02/03/2020 (Approximate)   SpO2 100%   BMI 22.86 kg/m   Physical Exam Vitals and nursing note reviewed.  Constitutional:      General: She is not in acute distress. Pulmonary:     Effort: Pulmonary effort is normal. No respiratory distress.  Skin:    General: Skin is warm and dry.     Comments: Right (venous) and left (arterial) femoral puncture sites both soft without active bleeding or hematoma.     Neurological:     Mental Status: She is alert.     Comments: Awake and alert, oriented to person and place but not time. Demonstrates word finding  difficulty but able to identify one word objects (pen, fingers). PERRL bilaterally. Demonstrates right eye visual field deficit. Tongue midline. Can spontaneously move left side and RLE but no spontaneous movements of RUE. Distal pulses (DPs) 1+ bilaterally.      Imaging: MR BRAIN WO CONTRAST  Result Date: 02/18/2020 CLINICAL DATA:  Stroke, follow-up. EXAM: MRI HEAD WITH CONTRAST MRV HEAD WITHOUT AND WITH CONTRAST TECHNIQUE: Multiplanar, multiecho pulse sequences of the brain and surrounding structures were obtained with intravenous contrast. Angiographic images of the intracranial venous structures were obtained using MRV technique without and with intravenous contrast. COMPARISON:  Head CT 02/16/2020, report from thrombectomy 02/15/2020, MRI/MRV head 02/15/2020 CONTRAST:  36mL GADAVIST GADOBUTROL 1 MMOL/ML IV SOLN FINDINGS: MR HEAD FINDINGS Brain: The examination is intermittently motion degraded, limiting evaluation. Most notably, there is severe motion degradation of the sagittal T1 weighted sequence and moderate motion degradation of the coronal T2 weighted sequence. The dominant acute/subacute hemorrhagic venous infarct within the left frontoparietal lobes has not significantly changed. Unchanged local mass effect with partial effacement of the left lateral ventricle. Restricted diffusion at site of an acute/early subacute hemorrhagic infarct within the right callosal splenium has subtly increased in extent. Persistent although decreased T2/FLAIR hyperintensity within the bilateral basal ganglia and thalami. Possible new punctate acute infarct within the left thalamus capsular junction (series 4, image 24). Bilateral acute/early subacute deep watershed white matter infarcts otherwise appear similar in distribution and extent. Persistent trace subarachnoid hemorrhage along the left frontoparietal convexity. No midline shift. Vascular: Expected proximal arterial  flow voids. Skull and upper cervical  spine: Within limitations of motion degradation, no focal marrow lesion is identified. Sinuses/Orbits: Visualized orbits show no acute finding. Fluid within the posterior ethmoid air cells and sphenoid sinuses bilaterally. Right mastoid effusion. MRV HEAD FINDINGS Flow within the superior sagittal sinus has significantly improved since the prior MRV of 02/15/2020. However, there are persistent foci of nonocclusive thrombus within this vessel. Nonocclusive thrombus within the left transverse and proximal sigmoid dural venous sinuses has increased. Persistent nonocclusive thrombus within the distal right transverse sinus. Persistent near occlusive or occlusive thrombus within the right sigmoid sinus and visualized right internal jugular vein. The degree of flow within the deep venous system is unchanged. The veins of Trolard and a few additional cerebral veins overlying the bilateral frontoparietal convexities remain thrombosed (best appreciated on the axial precontrast T1 weighted sequence). MRV impression #2 will be called to the ordering clinician or representative by the Radiologist Assistant, and communication documented in the PACS or [employer]. IMPRESSION: MRI brain: 1. Motion degraded exam. 2. 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 3. Restricted diffusion at site of an acute/subacute hemorrhagic infarct within the right callosal splenium has subtly increased in extent. 4. T2/FLAIR hyperintensity within the deep gray nuclei persists but has diminished. However, there may be a new punctate acute infarct within the left thalamocapsular junction. 5. 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 6. Persistent trace subarachnoid hemorrhage along the left frontoparietal convexity. MRV head: 1. Flow within the superior sagittal sinus has significantly improved  since the prior MRV of 02/15/2020. However, there are persistent foci of nonocclusive thrombus within this vessel. 2. Nonocclusive thrombus within the left transverse and proximal sigmoid dural venous sinuses has increased. 3. Persistent nonocclusive thrombus within the right transverse sinus. 4. Persistent near occlusive or occlusive thrombus within the right sigmoid sinus and visualized right internal jugular vein. 5. The degree of flow within the deep venous system is unchanged. 6. The veins of Trolard and a few additional cerebral veins overlying the bilateral frontoparietal convexities remain thrombosed. Electronically Signed   By: Kellie Simmering DO   On: 02/18/2020 11:38   DG Chest Port 1 View  Result Date: 02/18/2020 CLINICAL DATA:  Abnormal respirations. EXAM: PORTABLE CHEST 1 VIEW COMPARISON:  02/14/2020. FINDINGS: Interim extubation of NG tube. Right PICC line noted with tip over cavoatrial junction. Borderline cardiomegaly. Diffuse bilateral pulmonary infiltrates/edema. No pleural effusion or pneumothorax. IMPRESSION: 1. Interim extubation and removal of NG tube. Right PICC line noted with tip over cavoatrial junction. 2. Borderline cardiomegaly. 3. Diffuse bilateral pulmonary infiltrates/edema noted on today's exam. Electronically Signed   By: Marcello Moores  Register   On: 02/18/2020 05:36   EEG adult  Result Date: 02/16/2020 Lora Havens, MD     02/16/2020 10:46 AM Patient Name: Jill Clarke MRN: [id number] Epilepsy Attending: Lora Havens Referring Physician/Provider: Dr Rosalin Hawking Date: 02/16/2020 Duration: 24.48 mins Patient history: 35 year old female with altered mental status.  EEG to evaluate for seizures. Level of alertness: Comatose AEDs during EEG study: LEV Technical aspects: This EEG study was done with scalp electrodes positioned according to the 10-20 International system of electrode placement. Electrical activity was acquired at a sampling rate of 500Hz  and reviewed with a  high frequency filter of 70Hz  and a low frequency filter of 1Hz . EEG data were recorded continuously and digitally stored. Description: EEG showed continuous polymorphic  3 to 6 Hz theta-delta slowing. Hperventilation and photic stimulation were not performed.   ABNORMALITY -Continuous slow, generalized IMPRESSION: This study is suggestive of moderate to severe diffuse encephalopathy, nonspecific etiology.  No seizures or epileptiform discharges were seen throughout the recording. Lora Havens   MR MRV HEAD W WO CONTRAST  Result Date: 02/18/2020 CLINICAL DATA:  Stroke, follow-up. EXAM: MRI HEAD WITH CONTRAST MRV HEAD WITHOUT AND WITH CONTRAST TECHNIQUE: Multiplanar, multiecho pulse sequences of the brain and surrounding structures were obtained with intravenous contrast. Angiographic images of the intracranial venous structures were obtained using MRV technique without and with intravenous contrast. COMPARISON:  Head CT 02/16/2020, report from thrombectomy 02/15/2020, MRI/MRV head 02/15/2020 CONTRAST:  40mL GADAVIST GADOBUTROL 1 MMOL/ML IV SOLN FINDINGS: MR HEAD FINDINGS Brain: The examination is intermittently motion degraded, limiting evaluation. Most notably, there is severe motion degradation of the sagittal T1 weighted sequence and moderate motion degradation of the coronal T2 weighted sequence. The dominant acute/subacute hemorrhagic venous infarct within the left frontoparietal lobes has not significantly changed. Unchanged local mass effect with partial effacement of the left lateral ventricle. Restricted diffusion at site of an acute/early subacute hemorrhagic infarct within the right callosal splenium has subtly increased in extent. Persistent although decreased T2/FLAIR hyperintensity within the bilateral basal ganglia and thalami. Possible new punctate acute infarct within the left thalamus capsular junction (series 4, image 24). Bilateral acute/early subacute deep watershed white matter infarcts  otherwise appear similar in distribution and extent. Persistent trace subarachnoid hemorrhage along the left frontoparietal convexity. No midline shift. Vascular: Expected proximal arterial flow voids. Skull and upper cervical spine: Within limitations of motion degradation, no focal marrow lesion is identified. Sinuses/Orbits: Visualized orbits show no acute finding. Fluid within the posterior ethmoid air cells and sphenoid sinuses bilaterally. Right mastoid effusion. MRV HEAD FINDINGS Flow within the superior sagittal sinus has significantly improved since the prior MRV of 02/15/2020. However, there are persistent foci of nonocclusive thrombus within this vessel. Nonocclusive thrombus within the left transverse and proximal sigmoid dural venous sinuses has increased. Persistent nonocclusive thrombus within the distal right transverse sinus. Persistent near occlusive or occlusive thrombus within the right sigmoid sinus and visualized right internal jugular vein. The degree of flow within the deep venous system is unchanged. The veins of Trolard and a few additional cerebral veins overlying the bilateral frontoparietal convexities remain thrombosed (best appreciated on the axial precontrast T1 weighted sequence). MRV impression #2 will be called to the ordering clinician or representative by the Radiologist Assistant, and communication documented in the PACS or [employer]. IMPRESSION: MRI brain: 1. Motion degraded exam. 2. 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 3. Restricted diffusion at site of an acute/subacute hemorrhagic infarct within the right callosal splenium has subtly increased in extent. 4. T2/FLAIR hyperintensity within the deep gray nuclei persists but has diminished. However, there may be a new punctate acute infarct within the left thalamocapsular junction. 5. Bilateral acute/subacute deep watershed  white matter infarcts otherwise appear similar to the prior MRI. 6. Persistent trace subarachnoid hemorrhage along the left frontoparietal convexity. MRV head: 1. Flow within the superior sagittal sinus has significantly improved since the prior MRV of 02/15/2020. However, there are persistent foci of nonocclusive thrombus within this vessel. 2. Nonocclusive thrombus within the left transverse and proximal sigmoid dural venous sinuses has increased. 3. Persistent nonocclusive thrombus within the right transverse sinus. 4. Persistent near occlusive or occlusive thrombus within the right  sigmoid sinus and visualized right internal jugular vein. 5. The degree of flow within the deep venous system is unchanged. 6. The veins of Trolard and a few additional cerebral veins overlying the bilateral frontoparietal convexities remain thrombosed. Electronically Signed   By: Kellie Simmering DO   On: 02/18/2020 11:38   ECHOCARDIOGRAM COMPLETE  Result Date: 02/16/2020    ECHOCARDIOGRAM REPORT   Patient Name:   LIANI LEBEOUF Date of Exam: 02/16/2020 Medical Rec #:  [id number]          Height:       65.0 in Accession #:    [id number]         Weight:       153.9 lb Date of Birth:  [birth year]         BSA:          1.770 m Patient Age:    69 years           BP:           99/80 mmHg Patient Gender: F                  HR:           96 bpm. Exam Location:  Inpatient Procedure: 2D Echo Indications:    stroke 434.91  History:        Patient has no prior history of Echocardiogram examinations.  Sonographer:    Johny Chess Referring Phys: [place]  1. Left ventricular ejection fraction, by estimation, is 55 to 60%. The left ventricle has normal function. The left ventricle has no regional wall motion abnormalities. Left ventricular diastolic parameters were normal.  2. Right ventricular systolic function is normal. The right ventricular size is normal. There is normal pulmonary artery systolic pressure.  3.  Mild mitral valve leaflet thickening and calcification. Trivial mitral valve regurgitation.  4. The aortic valve is tricuspid. There is mild thickening of the aortic valve. Aortic valve regurgitation is not visualized. No aortic stenosis is present.  5. The inferior vena cava is normal in size with <50% respiratory variability, suggesting right atrial pressure of 8 mmHg. Conclusion(s)/Recommendation(s): No intracardiac source of embolism detected on this transthoracic study. A transesophageal echocardiogram is recommended to exclude cardiac source of embolism if clinically indicated. FINDINGS  Left Ventricle: Left ventricular ejection fraction, by estimation, is 55 to 60%. The left ventricle has normal function. The left ventricle has no regional wall motion abnormalities. The left ventricular internal cavity size was normal in size. There is  no left ventricular hypertrophy. Left ventricular diastolic parameters were normal. Right Ventricle: The right ventricular size is normal. Right vetricular wall thickness was not well visualized. Right ventricular systolic function is normal. There is normal pulmonary artery systolic pressure. The tricuspid regurgitant velocity is 2.06 m/s, and with an assumed right atrial pressure of 3 mmHg, the estimated right ventricular systolic pressure is 0000000 mmHg. Left Atrium: Left atrial size was normal in size. Right Atrium: Right atrial size was normal in size. Pericardium: There is no evidence of pericardial effusion. Mitral Valve: The mitral valve is abnormal. There is mild thickening of the mitral valve leaflet(s). There is mild calcification of the mitral valve leaflet(s). Trivial mitral valve regurgitation. Tricuspid Valve: The tricuspid valve is normal in structure. Tricuspid valve regurgitation is trivial. Aortic Valve: The aortic valve is tricuspid. There is mild thickening of the aortic valve. Aortic valve regurgitation is not visualized. No aortic stenosis is present.  Pulmonic  Valve: The pulmonic valve was normal in structure. Pulmonic valve regurgitation is trivial. Aorta: The aortic root and ascending aorta are structurally normal, with no evidence of dilitation. Venous: The inferior vena cava is normal in size with less than 50% respiratory variability, suggesting right atrial pressure of 8 mmHg. IAS/Shunts: No atrial level shunt detected by color flow Doppler.  LEFT VENTRICLE PLAX 2D LVIDd:         4.40 cm  Diastology LVIDs:         3.20 cm  LV e' medial:    12.70 cm/s LV PW:         1.00 cm  LV E/e' medial:  7.3 LV IVS:        0.80 cm  LV e' lateral:   15.60 cm/s LVOT diam:     1.90 cm  LV E/e' lateral: 6.0 LV SV:         50 LV SV Index:   28 LVOT Area:     2.84 cm  RIGHT VENTRICLE             IVC RV S prime:     15.70 cm/s  IVC diam: 1.90 cm TAPSE (M-mode): 2.3 cm LEFT ATRIUM             Index       RIGHT ATRIUM           Index LA diam:        3.00 cm 1.70 cm/m  RA Area:     11.90 cm LA Vol (A2C):   32.6 ml 18.42 ml/m RA Volume:   27.70 ml  15.65 ml/m LA Vol (A4C):   32.5 ml 18.37 ml/m LA Biplane Vol: 34.1 ml 19.27 ml/m  AORTIC VALVE LVOT Vmax:   97.30 cm/s LVOT Vmean:  73.400 cm/s LVOT VTI:    0.177 m  AORTA Ao Root diam: 2.50 cm Ao Asc diam:  2.30 cm MITRAL VALVE               TRICUSPID VALVE MV Area (PHT): 4.31 cm    TR Peak grad:   17.0 mmHg MV Decel Time: 176 msec    TR Vmax:        206.00 cm/s MV E velocity: 93.00 cm/s MV A velocity: 81.80 cm/s  SHUNTS MV E/A ratio:  1.14        Systemic VTI:  0.18 m                            Systemic Diam: 1.90 cm Gwyndolyn Kaufman MD Electronically signed by Gwyndolyn Kaufman MD Signature Date/Time: 02/16/2020/2:49:18 PM    Final     Labs:  CBC: Recent Labs    02/17/20 0400 02/18/20 0535 02/19/20 0540 02/20/20 0548  WBC 13.0* 14.4* 13.5* 13.9*  HGB 8.2* 8.0* 8.4* 9.4*  HCT 26.2* 24.5* 25.5* 29.8*  PLT 467* 483* 477* 545*    COAGS: Recent Labs    02/12/20 0417  INR 1.2    BMP: Recent Labs     02/17/20 0400 02/17/20 0900 02/18/20 0535 02/18/20 1325 02/19/20 0540 02/19/20 1038 02/19/20 1537 02/19/20 2230 02/20/20 0548  NA 151*   < > 145   < > 161* 142 140 139 137  K 3.1*  --  3.8  --  2.8*  --   --   --  3.4*  CL 117*  --  111  --  128*  --   --   --  103  CO2 25  --  28  --  22  --   --   --  25  GLUCOSE 207*  --  111*  --  82  --   --   --  124*  BUN 5*  --  5*  --  <5*  --   --   --  <5*  CALCIUM 8.7*  --  8.8*  --  7.6*  --   --   --  9.2  CREATININE 0.39*  --  0.37*  --  0.40*  --   --   --  0.41*  GFRNONAA >60  --  >60  --  >60  --   --   --  >60   < > = values in this interval not displayed.    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 Estanislado Pandy; with residual thrombus load s/p cerebral arteriogram with mechanical thrombectomy of superior sagittal sinus, straight sinus, vein of galen, bilateral IJs, and right transverse sinus via right (venous) and left (arterial) femoral approach 02/15/2020 by Dr. Estanislado Pandy. Patient's condition improving- awake and oriented x2, follows simple commands, demonstrates word finding difficulty but able to identify one word objects (pen, fingers), demonstrates right eye visual defect, can spontaneously move left side and RLE but no spontaneous movements of RUE. Right (venous) and left (arterial) femoral puncture sites stable, distal pulses stable. For [place] today. Recommend intense speech therapy/PT/OT. Plan to follow-up with Dr. Estanislado Pandy in clinic 4-6 weeks after discharge ([facility] schedulers to call patient to set  up this appointment). Further plans per neurology- appreciate and agree with management. Please call [facility] with questions/concerns.   Electronically Signed: Earley Abide, PA-C 02/20/2020, 9:15 AM   I spent a total of 25 Minutes at the the patient's bedside AND on the patient's hospital floor or unit, greater than 50% of which was counseling/coordinating care for dural venous sinus thrombosis s/p revascularization.

## 2020-02-20 NOTE — Progress Notes (Addendum)
STROKE TEAM PROGRESS NOTE  INTERVAL HISTORY No acute overnight events. Patient evaluated at bedside this morning, no family present in the room.  Patient is difficult to arouse this morning as just finished working with PT/OT before examination.  Opens eyes intermittently but difficult to evaluate due to sleepiness.  PT /OT suggest SNF, but believes that may progress to CIR if patient is able to remain aroused consistently.  Neurologically patient is stable and transfer orders has been placed to move out of ICU to progressive unit.  Patient again failed swallow test this morning and plan is to do core track this afternoon.  Had her CBG level 64 yesterday and is started on D5 NS at 75 cc.  Potassium has increased to 3.4 from 2.8 yesterday, repleted this morning as well.  As per RN, patient was much more alert, awake and responding this morning as compared to yesterday.  Blood pressure well controlled.  Neurologically stable and unchanged.  Vitals:   02/20/20 0600 02/20/20 0700 02/20/20 0800 02/20/20 0900  BP: (!) 156/83 (!) 167/88 (!) 148/96 (!) 171/89  Pulse: 88 81 (!) 103 (!) 104  Resp: 19 (!) 21 19 (!) 21  Temp:   98.3 F (36.8 C)   TempSrc:   Oral   SpO2: 100% 100% 100% 100%  Weight:      Height:       CBC:  Recent Labs  Lab 02/14/20 1111 02/15/20 1032 02/16/20 0419 02/16/20 1525 02/19/20 0540 02/20/20 0548  WBC 15.9*   < > 15.2*   < > 13.5* 13.9*  NEUTROABS 9.8*  --  10.6*  --   --   --   HGB 8.6*   < > 7.2*   < > 8.4* 9.4*  HCT 27.7*   < > 23.2*   < > 25.5* 29.8*  MCV 93.9   < > 93.5   < > 93.4 93.4  PLT 575*   < > 480*   < > 477* 545*   < > = values in this interval not displayed.   Basic Metabolic Panel:  Recent Labs  Lab 02/15/20 1612 02/15/20 2300 02/17/20 0400 02/17/20 0900 02/18/20 0535 02/18/20 1325 02/19/20 0540 02/19/20 1038 02/19/20 2230 02/20/20 0548  NA 149*   < > 151*   < > 145   < > 161*   < > 139 137  K  --    < > 3.1*  --  3.8  --  2.8*  --   --   3.4*  CL  --    < > 117*  --  111  --  128*  --   --  103  CO2  --    < > 25  --  28  --  22  --   --  25  GLUCOSE  --    < > 207*  --  111*  --  82  --   --  124*  BUN  --    < > 5*  --  5*  --  <5*  --   --  <5*  CREATININE  --    < > 0.39*  --  0.37*  --  0.40*  --   --  0.41*  CALCIUM  --    < > 8.7*  --  8.8*  --  7.6*  --   --  9.2  MG 1.6*  --  1.8  --  2.2  --   --   --   --   --  PHOS 2.7  --   --   --  4.1  --   --   --   --   --    < > = values in this interval not displayed.   Lipid Panel:  Recent Labs  Lab 02/17/20 0500  TRIG 86    IMAGING past 24 hours  MR Brain 02/18/2020  1. Motion degraded exam. 2. 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 3. Restricted diffusion at site of an acute/subacute hemorrhagic infarct within the right callosal splenium has subtly increased in extent. 4. T2/FLAIR hyperintensity within the deep gray nuclei persists but has diminished. However, there may be a new punctate acute infarct within the left thalamocapsular junction. 5. 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 6. Persistent trace subarachnoid hemorrhage along the left frontoparietal convexity.  MRV Head 02/18/2020  1. Flow within the superior sagittal sinus has significantly improved since the prior MRV of 02/15/2020. However, there are persistent foci of nonocclusive thrombus within this vessel. 2. Nonocclusive thrombus within the left transverse and proximal sigmoid dural venous sinuses has increased. 3. Persistent nonocclusive thrombus within the right transverse sinus. 4. Persistent near occlusive or occlusive thrombus within the right sigmoid sinus and visualized right internal jugular vein. 5. The degree of flow within the deep venous system is unchanged. 6. The veins of Trolard and a few additional cerebral  veins overlying the bilateral frontoparietal convexities remain thrombosed.  Echocardiogram 02/16/2020  IMPRESSIONS   1. Left ventricular ejection fraction, by estimation, is 55 to 60%. The  left ventricle has normal function. The left ventricle has no regional  wall motion abnormalities. Left ventricular diastolic parameters were  normal.  2. Right ventricular systolic function is normal. The right ventricular  size is normal. There is normal pulmonary artery systolic pressure.  3. Mild mitral valve leaflet thickening and calcification. Trivial mitral  valve regurgitation.  4. The aortic valve is tricuspid. There is mild thickening of the aortic  valve. Aortic valve regurgitation is not visualized. No aortic stenosis is  present.  5. The inferior vena cava is normal in size with <50% respiratory  variability, suggesting right atrial pressure of 8 mmHg.   CT Head wo contrast 02/16/2020  IMPRESSION: 1. Continued interval decrease in conspicuity of scattered small volume subarachnoid hemorrhage involving both frontoparietal convexities, now only faintly visible and nearly resolved. 2. Continued interval evolution of bilateral watershed territory infarcts, most pronounced at the left frontoparietal convexity. Overall size and distribution is relatively similar. No hemorrhagic transformation by CT. 3. Persistent but improved hyperdensity about the veins of Trolard since prior CT. Likewise, the remainder of the dural sinuses also appear less dense and engorged as compared to previous. 4. No other new acute intracranial abnormality.  CT Head wo contrast 02/14/2020  IMPRESSION: Small volume bilateral frontoparietal and basilar cistern subarachnoid hemorrhage is unchanged. No new intracranial hemorrhage.  Small bilateral cerebral infarcts are not well demonstrated on this exam.  Increased conspicuity of left cerebral edema.  Hyperdense appearance of the cortical veins,  superior sagittal and right transverse sinus is less conspicuous than prior exam.  IR Thrombectomy 02/13/2020  Findings. 1.Extensive thrombus load in the TS bilaterally,the Rt SS ,RT IJV and post one third of the SSS. Large chunks of clot removed with revascularization of the TS bilaterally and partially the RT SS and the torcula. Sig decreased venous congestion of the cortical veins  noted post procedure. Post CT brain No ICH or mass effect or hydrocephalus. Clinically stable with pupils 50mm sluggish.  CT Head wo contrast 02/12/2020  IMPRESSION: 1. The study is positive for a small volume of subarachnoid hemorrhage. There is no midline shift. 2. Hyperdense dural sinuses and cortical veins especially on the right in addition to a filling defect within the transverse sinus on the right is concerning for dural venous thrombosis and may be the source of the patient's subarachnoid hemorrhage. Emergent MRI/MRV is recommended for further evaluation.  MR Brain wo contrast MR Venogram Head 02/12/2020 IMPRESSION: 1. Extensive, acute dural venous sinus thrombosis involving the superior sagittal sinus into the right internal jugular vein via the dominant right transverse and sigmoid system. Both veins of Trolard are thrombosed and there is straight sinus/deep cerebral thrombosis as well. 2. Deep watershed white matter infarcts, small volume subarachnoid hemorrhage, and left more than right frontoparietal cortical swelling due to #1.  MR Brain wo contrast MR Venogram Head 02/15/2020  Brain MRI IMPRESSION: 1. Progressive left perirolandic venous infarction and petechial hemorrhage. 2. New edema in the deep gray nuclei without infarction.  Intracranial MRV: IMPRESSION: 1. Improved flow especially in the deep venous system. Improved flow also seen in the proximal right transverse sinus. 2. Occlusive clot still seen in the superior sagittal, distal right transverse, and right sigmoid  dural sinuses. No recanalization of the veins of Trolard.  EEG adult 02/12/2020  ABNORMALITY -Continue slow, generalized  IMPRESSION: This study is suggestive of moderate diffuse encephalopathy, nonspecific etiology.  At 1531, patient was noted to have right upper extremity jerking.  Concomitant EEG showed high amplitude 2 to 3 Hz rhythmic delta slowing in bitemporal region with evolution in morphology and amplitude but without definite evolution in frequency concerning for focal motor seizure.   EEG adult  02/16/2020  IMPRESSION: This study is suggestive of moderate to severe diffuse encephalopathy, nonspecific etiology.  No seizures or epileptiform discharges were seen throughout the recording.    PHYSICAL EXAM GENERAL: Well developed, young female sitting comfortably in bed, NAD Head: Normocephalic and atraumatic. EENT: No OP obstruction, normal conjunctiva.  LUNGS - Normal respiratory effort on room air CV - Tachycardic to the 130's on cardiac monitor, 2+ pedal pulses ABDOMEN - Soft, non-distended Ext: warm, well perfused Neurological:  Mental Status: Patient is extubated. Alert, awake, oriented to place.  Follow some midline commands.  One-word speech today. Cranial Nerves: Left gaze preference, still unable to do complete right gaze doll's eye reflex +.  Both pupils small sluggishly reactive.  Face is asymmetric, decreased nasolabial fold on right.  Tongue midline.  Fundi not visualized Motor: Brisk semipurposeful movements in the left upper and lower extremity to noxious stimuli, less on right extremities  sensory: Unable to access. Deep Tendon Reflexes:  Right: Upper Extremity   Left: Upper extremity   biceps (C-5 to C-6) 2/4   biceps (C-5 to C-6) 2/4 tricep (C7) 2/4    triceps (C7) 2/4 Brachioradialis (C6) 2/4  Brachioradialis (C6) 2/4  Lower Extremity Lower Extremity  quadriceps (L-2 to L-4) 2/4   quadriceps (L-2 to L-4) 2/4 Achilles (S1) 2/4   Achilles (S1)  2/4 Gait: Deferred   ASSESSMENT/PLAN Ms. JAHYRA MARBACH is a 35 y.o. female with PMHx of asthma and sickle cell trait presented with altered mental status.  CT head revealed a small volume subarachnoid hemorrhage and hyperdense dural sinuses with filling defect concerning for dural venous thrombosis.  MRI confirmed an acute extensive dural  venous sinus thrombosis with deep watershed white matter infarct.  Extremely delicate situation, that is why, IR is consulted for thrombectomy and patient is with IR now.  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    MRI  and MRV 02/12/20 A). Extensive, acute dural venous sinus thrombosis involving the superior sagittal sinus into the right internal jugular vein via the dominant right transverse and sigmoid system. B).Both veins of Trolard are thrombosed and there is straight sinus/deep cerebral thrombosis as well. C). Deep watershed white matter infarcts, small volume subarachnoid hemorrhage, and left more than right frontoparietal cortical swelling due to #1.  IR Thrombectomy: 02/13/2020- Large chunks of clot removed with revascularization of the TS bilaterally and partially the RT SS and the torcula.Sig decreased venous congestion of the cortical veins noted post procedure.  MRI and MRV 02/15/20 - Improved flow especially in the deep venous system. Improved flow also seen in the proximal right transverse sinus. Occlusive clot still seen in the superior sagittal, distal right transverse, and right sigmoid dural sinuses. No recanalization of the veins of Trolard. Progressive left perirolandic venous infarction and petechial  hemorrhage. New edema in the deep gray nuclei without infarction.  IR Thrombectomy: 02/15/2020 - complete revascularization of SSS,Straight S,V of Galen and ICVs ,and RT TS. Sig focal stenosis at the RT TS/SS junction angioplastied with balloon with  improved flow into RT SS.  MRI and MRV 02/18/20: acute/subacute left frontoparietal and right CC hemorrhagic venous infarct, b/l deep watershed white matter infarcts appear similar to the prior MRI. Flow within the SSS has significantly improved since the prior MRV of 02/15/2020. However, there are persistent foci of occlusive or nonocclusive thrombus within SSS, b/l TS, b/l SS, and right IJ. Degree of flow within deep venous system unchanged, veins of Trolard remain to be thrombosed  2D Echo - EF is 55 to 60%.  LDL 108  HgbA1c 4.9   Hypercoagulable panel: mildly decreased protein S, but may be an acute phase reactant and should be rechecked as an outpatient in 6 to 8 weeks.  Repeat COVID testing: negative on 02/14/20 and on 02/11/20  VTE prophylaxis - IV heparin  No antithrombotic prior to admission, now on IV heparin. 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  On keppra 1000 bid  Repeat EEG 02/16/20 - moderate to severe diffuse encephalopathy  Continue keppra  Respiratory failure  Intubated on sedation ->  extubated 1/25  Tolerating well so far  CCM on board  Hypotension  BP on the low end  Phenylephrine started now off  . On IVF and  TF . BP goal 120-160 . Long-term BP goal normotensive  Anemia - acute blood loss  Hgb - 11.0->8.2->8.6->7.2->7.0->8.2->8.0->8.2>9.4  May related to repeated procedure and IV heparin  1 unit PRBC transfusion on 02/16/2020  CBC monitoring  Hyperlipidemia  Home meds: None  LDL 108, goal < 70  Will consider statin when po access  Continue statin at discharge  Dysphagia   Off OG 02/17/20  No po access now  NPO so far  May need to consider cortrak if not passing swallow  Other Stroke Risk Factors  Sickle Cell Trait: No associated risk found due to sickle cell trait.    Other Active Problems  Asthma   Leukocytosis - WBC's - 13.3->15.9 (afebrile) >15.2  Hypokalemia - potassium - 2.9->3.0->3.6->3.1 >3.8->2.8->3.4-supplement  Hypophosphatemia - Phosphorus - 2.0->1.3->1.9>4.1  Hospital day # 8   02/20/2020 12:24 PM  ATTENDING NOTE: I reviewed above note and agree with the assessment and plan. Pt was seen and examined.   The first round, RN at bedside, pt was lethargic and drowsy. RN stated that pt was awake alert and interactive, but had worked with PT/OT and currently worn off. The second round, speech therapist was at the bedside for swallow eval, pt was more awake alert and follows simple commands. She passed swallow and put on dys 1 and thin liquid. Pt still lethargic, slow responding to questions however orientated to place, age, but not to time. Follows simple commands. Left gaze preference, still has incomplete right gaze, blinking to visual threat bilaterally, still has mild right facial droop, tongue midline. Not cooperative with muscle strength testing but with painful stimulation, BUEs able to briefly against gravity, left stronger than right. BLEs with pain withdraw briskly with left stronger than right.Sensation,  coordinationnot cooperativeand gait not tested.  She is off 3% saline yesterday, Na now 137, still has mild leukocytosis, but no fever, still on unasyn. Yesterday had low glucose level and she was put on D5NS. Will continue D5NS for now, until pt able to eat 50% of the meal. Still on heparin IV, anemia stable and improved, once clinically more improved, will switch to po anticoagulation next week.  This patient is critically ill due to extensive venous infarct, SAH and venous occlusion/near occlusion, cerebral edema, seizure, dysphagia and at significant risk of neurological worsening, death form venous infarct extension, brain herniation, status epilepticus, aspiration, heart failure. This patient's care requires constant monitoring of vital signs, hemodynamics, respiratory and cardiac monitoring, review of multiple databases, neurological assessment, discussion with family, other specialists and medical decision making of high complexity. I spent 35 minutes of neurocritical care time in the care of this patient.  Rosalin Hawking, MD PhD Stroke Neurology 02/20/2020 12:24 PM     To contact Stroke Continuity provider, please refer to [email or web address] After hours, contact General Neurology

## 2020-02-21 LAB — GLUCOSE, CAPILLARY
Glucose-Capillary: 126 mg/dL — ABNORMAL HIGH (ref 70–99)
Glucose-Capillary: 114 mg/dL — ABNORMAL HIGH (ref 70–99)
Glucose-Capillary: 113 mg/dL — ABNORMAL HIGH (ref 70–99)
Glucose-Capillary: 104 mg/dL — ABNORMAL HIGH (ref 70–99)
Glucose-Capillary: 158 mg/dL — ABNORMAL HIGH (ref 70–99)

## 2020-02-21 LAB — CBC
HCT: 28.2 % — ABNORMAL LOW (ref 36.0–46.0)
Hemoglobin: 8.9 g/dL — ABNORMAL LOW (ref 12.0–15.0)
MCH: 29.1 pg (ref 26.0–34.0)
MCHC: 31.6 g/dL (ref 30.0–36.0)
MCV: 92.2 fL (ref 80.0–100.0)
Platelets: 498 10*3/uL — ABNORMAL HIGH (ref 150–400)
RBC: 3.06 MIL/uL — ABNORMAL LOW (ref 3.87–5.11)
RDW: 14.4 % (ref 11.5–15.5)
WBC: 12.3 10*3/uL — ABNORMAL HIGH (ref 4.0–10.5)
nRBC: 0 % (ref 0.0–0.2)

## 2020-02-21 LAB — BASIC METABOLIC PANEL
Anion gap: 6 (ref 5–15)
BUN: <5 mg/dL — ABNORMAL LOW (ref 6–20)
CO2: 25 mmol/L (ref 22–32)
Calcium: 8.9 mg/dL (ref 8.9–10.3)
Chloride: 106 mmol/L (ref 98–111)
Creatinine, Ser: 0.37 mg/dL — ABNORMAL LOW (ref 0.44–1.00)
GFR, Estimated: >60 mL/min (ref >60)
Glucose, Bld: 129 mg/dL — ABNORMAL HIGH (ref 70–99)
Potassium: 3.1 mmol/L — ABNORMAL LOW (ref 3.5–5.1)
Sodium: 137 mmol/L (ref 135–145)

## 2020-02-21 LAB — HEPARIN LEVEL (UNFRACTIONATED): Heparin Unfractionated: 0.58 IU/mL (ref 0.30–0.70)

## 2020-02-21 LAB — HCG, QUANTITATIVE, PREGNANCY: hCG, Beta Chain, Quant, S: <1 m[IU]/mL (ref <5)

## 2020-02-21 LAB — MAGNESIUM: Magnesium: 2 mg/dL (ref 1.7–2.4)

## 2020-02-21 LAB — VITAMIN B12: Vitamin B-12: 1036 pg/mL — ABNORMAL HIGH (ref 180–914)

## 2020-02-21 MED ORDER — POTASSIUM CHLORIDE 10 MEQ/50ML IV SOLN
10.0000 meq | INTRAVENOUS | Status: AC
  Administered 2020-02-21 (×4): 10 meq via INTRAVENOUS
  Filled 2020-02-21 (×4): qty 50

## 2020-02-21 MED ORDER — RESOURCE THICKENUP CLEAR PO POWD
ORAL | Status: DC | PRN
  Filled 2020-02-21 (×2): qty 125

## 2020-02-21 NOTE — Progress Notes (Addendum)
STROKE TEAM PROGRESS NOTE  INTERVAL HISTORY No acute overnight events. Patient evaluated at bedside this morning, no family present in the room.  Patient is easily arousable this morning.  Opens her eyes and follows commands intermittently.  She is oriented to time place and person.  Physical therapy suggest CIR. She is on thin liquid diet now.  Patient serum potassium 3.1 this morning, repleted with supplements.  No new complaints.  Patient is improving slowly.  Blood pressure well controlled.  Neurologically improving.  Vitals:   02/20/20 1859 02/20/20 2000 02/21/20 0512 02/21/20 0846  BP: (!) 121/93 (!) 121/91  (!) 128/93  Pulse: 89 98  78  Resp: 16 16  18   Temp: 98.9 F (37.2 C) 99.1 F (37.3 C)  98 F (36.7 C)  TempSrc: Oral Oral  Oral  SpO2: 100% 100%  100%  Weight:   65.9 kg   Height:       CBC:  Recent Labs  Lab 02/14/20 1111 02/15/20 1032 02/16/20 0419 02/16/20 1525 02/20/20 0548 02/21/20 0122  WBC 15.9*   < > 15.2*   < > 13.9* 12.3*  NEUTROABS 9.8*  --  10.6*  --   --   --   HGB 8.6*   < > 7.2*   < > 9.4* 8.9*  HCT 27.7*   < > 23.2*   < > 29.8* 28.2*  MCV 93.9   < > 93.5   < > 93.4 92.2  PLT 575*   < > 480*   < > 545* 498*   < > = values in this interval not displayed.   Basic Metabolic Panel:  Recent Labs  Lab 02/15/20 1612 02/15/20 2300 02/18/20 0535 02/18/20 1325 02/20/20 0548 02/21/20 0122  NA 149*   < > 145   < > 137 137  K  --    < > 3.8   < > 3.4* 3.1*  CL  --    < > 111   < > 103 106  CO2  --    < > 28   < > 25 25  GLUCOSE  --    < > 111*   < > 124* 129*  BUN  --    < > 5*   < > <5* <5*  CREATININE  --    < > 0.37*   < > 0.41* 0.37*  CALCIUM  --    < > 8.8*   < > 9.2 8.9  MG 1.6*   < > 2.2  --   --  2.0  PHOS 2.7  --  4.1  --   --   --    < > = values in this interval not displayed.   Lipid Panel:  Recent Labs  Lab 02/17/20 0500  TRIG 86    IMAGING past 24 hours  MR Brain 02/18/2020  1. Motion degraded exam. 2. The dominant  acute/subacute left frontoparietal hemorrhagic venous infarct has not significantly changed from the MRI of 02/15/2020. 3. Restricted diffusion at site of an acute/subacute hemorrhagic infarct within the right callosal splenium has subtly increased in extent. 4. T2/FLAIR hyperintensity within the deep gray nuclei persists but has diminished. However, there may be a new punctate acute infarct within the left thalamocapsular junction. 5. 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 6. Persistent trace subarachnoid hemorrhage along the left frontoparietal convexity.  MRV Head 02/18/2020  1. Flow within the superior sagittal sinus has significantly  improved since the prior MRV of 02/15/2020. However, there are persistent foci of nonocclusive thrombus within this vessel. 2. Nonocclusive thrombus within the left transverse and proximal sigmoid dural venous sinuses has increased. 3. Persistent nonocclusive thrombus within the right transverse sinus. 4. Persistent near occlusive or occlusive thrombus within the right sigmoid sinus and visualized right internal jugular vein. 5. The degree of flow within the deep venous system is unchanged. 6. The veins of [place]rolard and a few additional cerebral veins overlying the bilateral frontoparietal convexities remain thrombosed.  Echocardiogram 02/16/2020  IMPRESSIONS   1. Left ventricular ejection fraction, by estimation, is 55 to 60%. The  left ventricle has normal function. The left ventricle has no regional  wall motion abnormalities. Left ventricular diastolic parameters were  normal.  2. Right ventricular systolic function is normal. The right ventricular  size is normal. There is normal pulmonary artery systolic pressure.  3. Mild mitral valve leaflet thickening and calcification. Trivial mitral  valve regurgitation.  4. The aortic valve is tricuspid. There is mild thickening  of the aortic  valve. Aortic valve regurgitation is not visualized. No aortic stenosis is  present.  5. The inferior vena cava is normal in size with <50% respiratory  variability, suggesting right atrial pressure of 8 mmHg.   CT Head wo contrast 02/16/2020  IMPRESSION: 1. Continued interval decrease in conspicuity of scattered small volume subarachnoid hemorrhage involving both frontoparietal convexities, now only faintly visible and nearly resolved. 2. Continued interval evolution of bilateral watershed territory infarcts, most pronounced at the left frontoparietal convexity. Overall size and distribution is relatively similar. No hemorrhagic transformation by CT. 3. Persistent but improved hyperdensity about the veins of Trolard since prior CT. Likewise, the remainder of the dural sinuses also appear less dense and engorged as compared to previous. 4. No other new acute intracranial abnormality.  CT Head wo contrast 02/14/2020  IMPRESSION: Small volume bilateral frontoparietal and basilar cistern subarachnoid hemorrhage is unchanged. No new intracranial hemorrhage.  Small bilateral cerebral infarcts are not well demonstrated on this exam.  Increased conspicuity of left cerebral edema.  Hyperdense appearance of the cortical veins, superior sagittal and right transverse sinus is less conspicuous than prior exam.  IR Thrombectomy 02/13/2020  Findings. 1.Extensive thrombus load in the TS bilaterally,the Rt SS ,RT IJV and post one third of the SSS. Large chunks of clot removed with revascularization of the TS bilaterally and partially the RT SS and the torcula. Sig decreased venous congestion of the cortical veins noted post procedure. Post CT brain No ICH or mass effect or hydrocephalus. Clinically stable with pupils 78mm sluggish.  CT Head wo contrast 02/12/2020  IMPRESSION: 1. The study is positive for a small volume of subarachnoid hemorrhage. There is no  midline shift. 2. Hyperdense dural sinuses and cortical veins especially on the right in addition to a filling defect within the transverse sinus on the right is concerning for dural venous thrombosis and may be the source of the patient's subarachnoid hemorrhage. Emergent MRI/MRV is recommended for further evaluation.  MR Brain wo contrast MR Venogram Head 02/12/2020 IMPRESSION: 1. Extensive, acute dural venous sinus thrombosis involving the superior sagittal sinus into the right internal jugular vein via the dominant right transverse and sigmoid system. Both veins of [place] are thrombosed and there is straight sinus/deep cerebral thrombosis as well. 2. Deep watershed white matter infarcts, small volume subarachnoid hemorrhage, and left more than right frontoparietal cortical swelling due to #1.  MR Brain wo contrast MR Venogram Head  02/15/2020  Brain MRI IMPRESSION: 1. Progressive left perirolandic venous infarction and petechial hemorrhage. 2. New edema in the deep gray nuclei without infarction.  Intracranial MRV: IMPRESSION: 1. Improved flow especially in the deep venous system. Improved flow also seen in the proximal right transverse sinus. 2. Occlusive clot still seen in the superior sagittal, distal right transverse, and right sigmoid dural sinuses. No recanalization of the veins of Trolard.  EEG adult 02/12/2020  ABNORMALITY -Continue slow, generalized  IMPRESSION: This study is suggestive of moderate diffuse encephalopathy, nonspecific etiology.  At 1531, patient was noted to have right upper extremity jerking.  Concomitant EEG showed high amplitude 2 to 3 Hz rhythmic delta slowing in bitemporal region with evolution in morphology and amplitude but without definite evolution in frequency concerning for focal motor seizure.   EEG adult  02/16/2020  IMPRESSION: This study is suggestive of moderate to severe diffuse encephalopathy, nonspecific etiology.  No  seizures or epileptiform discharges were seen throughout the recording.    PHYSICAL EXAM GENERAL: Well developed, young female sitting comfortably in bed, NAD Head: Normocephalic and atraumatic. EENT: No OP obstruction, normal conjunctiva.  LUNGS - Normal respiratory effort on room air CV - Tachycardic to the 130's on cardiac monitor, 2+ pedal pulses ABDOMEN - Soft, non-distended Ext: warm, well perfused Neurological:  Mental Status:  Alert, awake, oriented to time, person, place.  Follow some midline commands.  One-word speech, not able to follow three-step commands. Cranial Nerves: Mid line gaze today, able to move eyes around appropriately .  Face is asymmetric, decreased nasolabial fold on right.  Tongue midline.   Motor: Able to move all extremities on command.  Motor strength 3/5 in left extremities and 2/5 in right extremities sensory: Unable to access. Deep Tendon Reflexes:  Right: Upper Extremity   Left: Upper extremity   biceps (C-5 to C-6) 2/4   biceps (C-5 to C-6) 3/4 tricep (C7) 2/4    triceps (C7) 3/4 Brachioradialis (C6) 2/4  Brachioradialis (C6) 3/4  Lower Extremity                                   Lower Extremity  quadriceps (L-2 to L-4) 2/4   quadriceps (L-2 to L-4) 3/4 Achilles (S1) 2/4   Achilles (S1) 3/4 Gait: Deferred   ASSESSMENT/PLAN Ms. Jill KOOISTRA is a 35 y.o. female with PMHx of asthma and sickle cell trait presented with altered mental status.  CT head revealed a small volume subarachnoid hemorrhage and hyperdense dural sinuses with filling defect concerning for dural venous thrombosis.  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  Extremely delicate situation, that is why, IR is consulted for thrombectomy and patient is with IR now.  CVST - involving SSS, straight sinus, deep cerebral vein, right TS, SS and IJ as well as b/l veins of [place], likely due to dehydration at this point  but looking into hypercoagulable state Venous infarcts - Deep watershed white matter infarct, left frontoparietal cortical infarct and small volume subarachnoid hemorrhage   Code Stroke CT head: positive for a small volume of SAH.    MRI  and MRV 02/12/20 A). Extensive, acute dural venous sinus thrombosis involving the superior sagittal sinus into the right internal jugular vein via the dominant right transverse and sigmoid system. B).Both veins of [place] are thrombosed and there is straight sinus/deep cerebral thrombosis as well. C). Deep  watershed white matter infarcts, small volume subarachnoid hemorrhage, and left more than right frontoparietal cortical swelling due to #1.  IR Thrombectomy: 02/13/2020- Large chunks of clot removed with revascularization of the TS bilaterally and partially the RT SS and the torcula.Sig decreased venous congestion of the cortical veins noted post procedure.  MRI and MRV 02/15/20 - Improved flow especially in the deep venous system. Improved flow also seen in the proximal right transverse sinus. Occlusive clot still seen in the superior sagittal, distal right transverse, and right sigmoid dural sinuses. No recanalization of the veins of [place]ard. Progressive left perirolandic venous infarction and petechial hemorrhage. New edema in the deep gray nuclei without infarction.  IR Thrombectomy: 02/15/2020 - complete revascularization of SSS,Straight S,V of [place] and ICVs ,and RT TS. Sig focal stenosis at the RT TS/SS junction angioplastied with balloon with  improved flow into RT SS.  MRI and MRV 02/18/20: acute/subacute left frontoparietal and right CC hemorrhagic venous infarct, b/l deep watershed white matter infarcts appear similar to the prior MRI. Flow within the SSS has significantly improved since the prior MRV of 02/15/2020. However, there are persistent foci of occlusive or nonocclusive thrombus within SSS, b/l TS, b/l SS, and right IJ. Degree of flow within deep  venous system unchanged, veins of [place]ard remain to be thrombosed  2D Echo - EF is 55 to 60%.  LDL 108  HgbA1c 4.9   Hypercoagulable panel: mildly decreased protein S, but may be an acute phase reactant and should be rechecked as an outpatient in 6 to 8 weeks.  Repeat COVID testing: negative on 02/14/20 and on 02/11/20  VTE prophylaxis - IV heparin  No antithrombotic prior to admission, now on IV heparin. 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  On keppra 1000 bid  Repeat EEG 02/16/20 - moderate to severe diffuse encephalopathy  Continue keppra  Respiratory failure  Intubated on sedation -> extubated 1/25  Tolerating well so far  Hypotension  BP on the low end  Phenylephrine started now off  . On IVF and TF . BP goal 120-160 . Long-term BP goal normotensive  Anemia - acute blood loss  Hgb - 11.0->8.2->8.6->7.2->7.0->8.2->8.0->8.2>9.4>8.9  May related to repeated procedure and IV heparin  1 unit PRBC transfusion on 02/16/2020  CBC monitoring  Hyperlipidemia  Home meds: None  LDL 108, goal < 70  Will consider statin when more stable  Continue statin at discharge  Dysphagia   Off OG 02/17/20  Thin liquid diet  Other Stroke Risk Factors  Sickle Cell Trait: No associated risk found due to sickle cell trait.     Other Active Problems  Asthma   Leukocytosis - WBC's - 13.3->15.9 (afebrile) >15.2  Hypokalemia - potassium - 2.9->3.0->3.6->3.1 >3.8->2.8->3.4->3.1supplement  Hypophosphatemia - Phosphorus - 2.0->1.3->1.9>4.1  Hospital day # 9   02/21/2020 10:06 AM  ATTENDING NOTE: I reviewed above note and agree with the assessment and plan.  Pt was seen and examined. Imaging/MRI is also reviewed in person.  It appears that her cognition has improved. She does have mild psychomotor slowing but is responsive. She knows that she is in the hospital but is unable to state why. She has no complaints. She does follow commands briskly. She has global weakness 4/5. She is able to name all items presented at the bedside. No clear visual field deficits are noted.  Rosalin Hawking, MD PhD Stroke Neurology 02/21/2020 10:06 AM     To contact Stroke Continuity provider, please refer to [email or web address] After hours, contact General Neurology

## 2020-02-21 NOTE — Progress Notes (Signed)
ANTICOAGULATION CONSULT NOTE  Pharmacy Consult:  IV Heparin Indication: Dural venous sinus thrombosis, S/P thrombectomy  Labs: Recent Labs    02/19/20 0540 02/19/20 1215 02/20/20 0548 02/21/20 0122  HGB 8.4*  --  9.4* 8.9*  HCT 25.5*  --  29.8* 28.2*  PLT 477*  --  545* 498*  HEPARINUNFRC  --  0.48 0.36 0.58  CREATININE 0.40*  --  0.41* 0.37*   Assessment: 35 yr old female with AMS, found to have a small volume subarachnoid hemorrhage and extensive dural venous thrombosis. Pharmacy was consulted to manage IV heparin per stroke protocol. She is S/P thrombectomy in IR on 1/21 and venogram, complete revascularization, angioplasty in IR on 1/23. Heparin was resumed post-procedure.  Heparin level 0.58, HgB 8.9.  No active bleed issues reported.  Goal of Therapy:  Heparin level 0.3-0.5 units/ml Monitor platelets by anticoagulation protocol: Yes   Plan:  Decrease heparin to 1100 units / hr Monitor daily heparin level and CBC, s/sx bleeding  Thank you Anette Guarneri, PharmD  02/21/2020 7:46 AM

## 2020-02-21 NOTE — Progress Notes (Signed)
  Speech Language Pathology Treatment: Dysphagia;Cognitive-Linquistic  Patient Details Name: Jill Clarke MRN: [id number] DOB: April 04, 1985 Today's Date: 02/21/2020 Time: 0160-1093 SLP Time Calculation (min) (ACUTE ONLY): 24 min  Assessment / Plan / Recommendation Clinical Impression  Therapist assisted and observed pt with recommended po's after swallow assessment yesterday. Aspiration risk is high, therefore pt was priority to see today. She was able to awaken adequately after minutes of SLP repositioning, verbal stimulation and oral care. Mild pooling of secretions on lateral buccal cavities continues. She was not safe to continue thin liquids with consistent and immediate coughs with straw and cup sips water but managed nectar thick without outward signs. Brief hesitation transiting puree due to external distractions. Modified order to reflect nectar thick liquids- continue puree. Suspect hallucinating and asking why "[place] was trying to talk to her" and said she thought she saw her. Orientation improving stating she had a stroke and continues to recall she is in the hospital. She is unaware intellectually or emergently her current state and needs. Assist to attend to right side of environment. Continued therapy to increase independence for cognition and swallow needed on an inpatient rehab unit.    HPI HPI: 35 yo presented with AMS. MRI >  left frontoparietal hemorrhagic venous infarct (no significant changed 02/15/2020.),  hemorrhagic infarct within the right callosal, possible new punctate acute infarct within the left thalamocapsular junction, bilateral deep watershed white matter infarcts, persistent trace subarachnoid hemorrhage along the left  frontoparietal convexity. Intubated 1/20-1/25. CXR Diffuse bilateral pulmonary infiltrates/edema. PMH: DM, sickle cell trait, asthma.      SLP Plan  Continue with current plan of care       Recommendations  Diet recommendations:  Dysphagia 1 (puree);Nectar-thick liquid Liquids provided via: Cup;Straw Medication Administration: Crushed with puree Supervision: Staff to assist with self feeding;Full supervision/cueing for compensatory strategies Compensations: Minimize environmental distractions;Slow rate;Small sips/bites;Lingual sweep for clearance of pocketing Postural Changes and/or Swallow Maneuvers: Seated upright 90 degrees;Upright 30-60 min after meal                General recommendations: Rehab consult Oral Care Recommendations: Oral care BID Follow up Recommendations: Inpatient Rehab SLP Visit Diagnosis: Dysphagia, unspecified (R13.10);Cognitive communication deficit (A35.573) Plan: Continue with current plan of care                       Houston Siren 02/21/2020, 10:21 AM   Orbie Pyo Colvin Caroli.Ed Risk analyst [phone] Office [phone]

## 2020-02-22 LAB — CBC
HCT: 29.9 % — ABNORMAL LOW (ref 36.0–46.0)
Hemoglobin: 9.5 g/dL — ABNORMAL LOW (ref 12.0–15.0)
MCH: 29.4 pg (ref 26.0–34.0)
MCHC: 31.8 g/dL (ref 30.0–36.0)
MCV: 92.6 fL (ref 80.0–100.0)
Platelets: 470 10*3/uL — ABNORMAL HIGH (ref 150–400)
RBC: 3.23 MIL/uL — ABNORMAL LOW (ref 3.87–5.11)
RDW: 14.7 % (ref 11.5–15.5)
WBC: 10.2 10*3/uL (ref 4.0–10.5)
nRBC: 0 % (ref 0.0–0.2)

## 2020-02-22 LAB — GLUCOSE, CAPILLARY
Glucose-Capillary: 107 mg/dL — ABNORMAL HIGH (ref 70–99)
Glucose-Capillary: 112 mg/dL — ABNORMAL HIGH (ref 70–99)
Glucose-Capillary: 121 mg/dL — ABNORMAL HIGH (ref 70–99)
Glucose-Capillary: 131 mg/dL — ABNORMAL HIGH (ref 70–99)
Glucose-Capillary: 135 mg/dL — ABNORMAL HIGH (ref 70–99)
Glucose-Capillary: 97 mg/dL (ref 70–99)

## 2020-02-22 LAB — BASIC METABOLIC PANEL
Anion gap: 8 (ref 5–15)
BUN: <5 mg/dL — ABNORMAL LOW (ref 6–20)
CO2: 24 mmol/L (ref 22–32)
Calcium: 9 mg/dL (ref 8.9–10.3)
Chloride: 106 mmol/L (ref 98–111)
Creatinine, Ser: 0.37 mg/dL — ABNORMAL LOW (ref 0.44–1.00)
GFR, Estimated: >60 mL/min (ref >60)
Glucose, Bld: 122 mg/dL — ABNORMAL HIGH (ref 70–99)
Potassium: 3.2 mmol/L — ABNORMAL LOW (ref 3.5–5.1)
Sodium: 138 mmol/L (ref 135–145)

## 2020-02-22 LAB — HEPARIN LEVEL (UNFRACTIONATED): Heparin Unfractionated: 0.46 IU/mL (ref 0.30–0.70)

## 2020-02-22 LAB — RPR: RPR Ser Ql: NONREACTIVE

## 2020-02-22 MED ORDER — POTASSIUM CHLORIDE 20 MEQ PO PACK
40.0000 meq | PACK | Freq: Two times a day (BID) | ORAL | Status: AC
  Administered 2020-02-22 – 2020-02-23 (×2): 40 meq via ORAL
  Filled 2020-02-22 (×2): qty 2

## 2020-02-22 NOTE — Progress Notes (Signed)
ANTICOAGULATION CONSULT NOTE  Pharmacy Consult:  IV Heparin Indication: Dural venous sinus thrombosis, S/P thrombectomy  Labs: Recent Labs    02/20/20 0548 02/21/20 0122 02/22/20 0041  HGB 9.4* 8.9* 9.5*  HCT 29.8* 28.2* 29.9*  PLT 545* 498* 470*  HEPARINUNFRC 0.36 0.58 0.46  CREATININE 0.41* 0.37* 0.37*   Assessment: 35 yr old female with AMS, found to have a small volume subarachnoid hemorrhage and extensive dural venous thrombosis. Pharmacy was consulted to manage IV heparin per stroke protocol. She is S/P thrombectomy in IR on 1/21 and venogram, complete revascularization, angioplasty in IR on 1/23. Heparin was resumed post-procedure.  Heparin level therapeutic, HgB 9.5.  No active bleed issues reported.  Goal of Therapy:  Heparin level 0.3-0.5 units/ml Monitor platelets by anticoagulation protocol: Yes   Plan:  Decrease heparin to 1100 units / hr Monitor daily heparin level and CBC, s/sx bleeding  Thank you Anette Guarneri, PharmD  02/22/2020 7:59 AM

## 2020-02-22 NOTE — Plan of Care (Signed)
  Problem: Clinical Measurements: Goal: Will remain free from infection Outcome: Progressing   Problem: Clinical Measurements: Goal: Ability to maintain clinical measurements within normal limits will improve Outcome: Progressing

## 2020-02-22 NOTE — Progress Notes (Signed)
Inpatient Rehab Admissions Coordinator:   Met with patient at bedside to discuss potential CIR admission. Pt. Stated interest. I spoke with her mother over the phone and she confirmed that she and other family members will provide 24/7 support at discharge.  Will pursue for potential admit next week, pending bed availability and insurance auth.   Clemens Catholic, [place], [place] Admissions Coordinator  [phone] ([place]) [phone] (office)

## 2020-02-22 NOTE — Progress Notes (Signed)
STROKE TEAM PROGRESS NOTE  INTERVAL HISTORY She tells me she is feeling better than she did yesterday. Overall she appears to be getting better.  She is watching television in bed. No headaches are reported.  Vitals:   02/22/20 0446 02/22/20 0809 02/22/20 1158 02/22/20 1200  BP:  115/86 121/90 121/90  Pulse:  (!) 101 87 86  Resp:  16 18 18   Temp:  98.7 F (37.1 C) 98.9 F (37.2 C) 98.9 F (37.2 C)  TempSrc:  Oral Oral Oral  SpO2:  100% 100% 100%  Weight: 66 kg     Height:       CBC:  Recent Labs  Lab 02/16/20 0419 02/16/20 1525 02/21/20 0122 02/22/20 0041  WBC 15.2*   < > 12.3* 10.2  NEUTROABS 10.6*  --   --   --   HGB 7.2*   < > 8.9* 9.5*  HCT 23.2*   < > 28.2* 29.9*  MCV 93.5   < > 92.2 92.6  PLT 480*   < > 498* 470*   < > = values in this interval not displayed.   Basic Metabolic Panel:  Recent Labs  Lab 02/15/20 1612 02/15/20 2300 02/18/20 0535 02/18/20 1325 02/21/20 0122 02/22/20 0041  NA 149*   < > 145   < > 137 138  K  --    < > 3.8   < > 3.1* 3.2*  CL  --    < > 111   < > 106 106  CO2  --    < > 28   < > 25 24  GLUCOSE  --    < > 111*   < > 129* 122*  BUN  --    < > 5*   < > <5* <5*  CREATININE  --    < > 0.37*   < > 0.37* 0.37*  CALCIUM  --    < > 8.8*   < > 8.9 9.0  MG 1.6*   < > 2.2  --  2.0  --   PHOS 2.7  --  4.1  --   --   --    < > = values in this interval not displayed.   Lipid Panel:  Recent Labs  Lab 02/17/20 0500  TRIG 86    IMAGING past 24 hours  MR Brain 02/18/2020 1. Motion degraded exam. 2. 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 3. Restricted diffusion at site of an acute/subacute hemorrhagic infarct within the right callosal splenium has subtly increased in extent. 4. T2/FLAIR hyperintensity within the deep gray nuclei persists but has diminished. However, there may be a new punctate acute infarct within the left  thalamocapsular junction. 5. 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 6. Persistent trace subarachnoid hemorrhage along the left frontoparietal convexity.  MRV Head 02/18/2020 1. Flow within the superior sagittal sinus has significantly improved since the prior MRV of 02/15/2020. However, there are persistent foci of nonocclusive thrombus within this vessel. 2. Nonocclusive thrombus within the left transverse and proximal sigmoid dural venous sinuses has increased. 3. Persistent nonocclusive thrombus within the right transverse sinus. 4. Persistent near occlusive or occlusive thrombus within the right sigmoid sinus and visualized right internal jugular vein. 5. The degree of flow within the deep venous system is unchanged. 6. The veins of [place]rolard and a few additional cerebral veins overlying the bilateral frontoparietal  convexities remain thrombosed.  Echocardiogram 02/16/2020 IMPRESSIONS  1. Left ventricular ejection fraction, by estimation, is 55 to 60%. The  left ventricle has normal function. The left ventricle has no regional  wall motion abnormalities. Left ventricular diastolic parameters were  normal.  2. Right ventricular systolic function is normal. The right ventricular  size is normal. There is normal pulmonary artery systolic pressure.  3. Mild mitral valve leaflet thickening and calcification. Trivial mitral  valve regurgitation.  4. The aortic valve is tricuspid. There is mild thickening of the aortic  valve. Aortic valve regurgitation is not visualized. No aortic stenosis is  present.  5. The inferior vena cava is normal in size with <50% respiratory  variability, suggesting right atrial pressure of 8 mmHg.   CT Head wo contrast 02/16/2020 IMPRESSION: 1. Continued interval decrease in conspicuity of scattered small volume subarachnoid hemorrhage involving both frontoparietal convexities, now  only faintly visible and nearly resolved. 2. Continued interval evolution of bilateral watershed territory infarcts, most pronounced at the left frontoparietal convexity. Overall size and distribution is relatively similar. No hemorrhagic transformation by CT. 3. Persistent but improved hyperdensity about the veins of Trolard since prior CT. Likewise, the remainder of the dural sinuses also appear less dense and engorged as compared to previous. 4. No other new acute intracranial abnormality.  CT Head wo contrast 02/14/2020 IMPRESSION: Small volume bilateral frontoparietal and basilar cistern subarachnoid hemorrhage is unchanged. No new intracranial hemorrhage. Small bilateral cerebral infarcts are not well demonstrated on this exam. Increased conspicuity of left cerebral edema. Hyperdense appearance of the cortical veins, superior sagittal and right transverse sinus is less conspicuous than prior exam.  IR Thrombectomy 02/13/2020 Findings. 1.Extensive thrombus load in the TS bilaterally,the Rt SS ,RT IJV and post one third of the SSS. Large chunks of clot removed with revascularization of the TS bilaterally and partially the RT SS and the torcula. Sig decreased venous congestion of the cortical veins noted post procedure. Post CT brain No ICH or mass effect or hydrocephalus. Clinically stable with pupils 2mm sluggish.  CT Head wo contrast 02/12/2020 IMPRESSION: 1. The study is positive for a small volume of subarachnoid hemorrhage. There is no midline shift. 2. Hyperdense dural sinuses and cortical veins especially on the right in addition to a filling defect within the transverse sinus on the right is concerning for dural venous thrombosis and may be the source of the patient's subarachnoid hemorrhage. Emergent MRI/MRV is recommended for further evaluation.  MR Brain wo contrast MR Venogram Head 02/12/2020 IMPRESSION: 1. Extensive, acute dural venous sinus thrombosis  involving the superior sagittal sinus into the right internal jugular vein via the dominant right transverse and sigmoid system. Both veins of Trolard are thrombosed and there is straight sinus/deep cerebral thrombosis as well. 2. Deep watershed white matter infarcts, small volume subarachnoid hemorrhage, and left more than right frontoparietal cortical swelling due to #1.  MR Brain wo contrast MR Venogram Head 02/15/2020  Brain MRI IMPRESSION: 1. Progressive left perirolandic venous infarction and petechial hemorrhage. 2. New edema in the deep gray nuclei without infarction.  Intracranial MRV: IMPRESSION: 1. Improved flow especially in the deep venous system. Improved flow also seen in the proximal right transverse sinus. 2. Occlusive clot still seen in the superior sagittal, distal right transverse, and right sigmoid dural sinuses. No recanalization of the veins of Trolard.  EEG adult 02/12/2020  ABNORMALITY -Continue slow, generalized IMPRESSION: This study is suggestive of moderate diffuse encephalopathy, nonspecific etiology.  At 1531, patient was  noted to have right upper extremity jerking.  Concomitant EEG showed high amplitude 2 to 3 Hz rhythmic delta slowing in bitemporal region with evolution in morphology and amplitude but without definite evolution in frequency concerning for focal motor seizure.  EEG adult  02/16/2020 IMPRESSION: This study is suggestive of moderate to severe diffuse encephalopathy, nonspecific etiology.  No seizures or epileptiform discharges were seen throughout the recording.  PHYSICAL EXAM GENERAL: Well developed, young female sitting comfortably in bed, NAD Head: Normocephalic and atraumatic. EENT: No OP obstruction, normal conjunctiva.  LUNGS - Normal respiratory effort on room air CV - Tachycardic to the 130's on cardiac monitor, 2+ pedal pulses ABDOMEN - Soft, non-distended Ext: warm, well perfused Neurological:  Mental Status:   Alert, awake, oriented to time, person, place.  Follow some midline commands and appendicular commands.   Speech is more fluid. Cranial Nerves: Mid line gaze today, able to move eyes around appropriately .  Face is asymmetric, decreased nasolabial fold on right.  Tongue midline.   Motor: Able to move all extremities on command.  Motor strength 4-/5 in left extremities and 4/5 in right extremities sensory: Unable to access. Deep Tendon Reflexes:  Right: Upper Extremity   Left: Upper extremity   biceps (C-5 to C-6) 2/4   biceps (C-5 to C-6) 3/4 tricep (C7) 2/4    triceps (C7) 3/4 Brachioradialis (C6) 2/4  Brachioradialis (C6) 3/4  Lower Extremity                                   Lower Extremity  quadriceps (L-2 to L-4) 2/4   quadriceps (L-2 to L-4) 3/4 Achilles (S1) 2/4   Achilles (S1) 3/4 Gait: Deferred   ASSESSMENT/PLAN Ms. KARILYNN CARRANZA is a 35 y.o. female with PMHx of asthma and sickle cell trait presented with altered mental status.  CT head revealed a small volume subarachnoid hemorrhage and hyperdense dural sinuses with filling defect concerning for dural venous thrombosis.  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  Extremely delicate situation, that is why, IR is consulted for thrombectomy and patient is with IR now.  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    MRI  and MRV 02/12/20 A). Extensive, acute dural venous sinus thrombosis involving the superior sagittal sinus into the right internal jugular vein via the dominant right transverse and sigmoid system. B).Both veins of  Trolard are thrombosed and there is straight sinus/deep cerebral thrombosis as well. C). Deep watershed white matter infarcts, small volume subarachnoid hemorrhage, and left more than right frontoparietal cortical swelling due to #1.  IR Thrombectomy: 02/13/2020- Large chunks of clot removed with revascularization of the TS bilaterally and partially the RT SS and the torcula.Sig decreased venous congestion of the cortical veins noted post procedure.  MRI and MRV 02/15/20 - Improved flow especially in the deep venous system. Improved flow also seen in the proximal right transverse sinus. Occlusive clot still seen in the superior sagittal, distal right transverse, and right sigmoid dural sinuses. No recanalization of the veins of Trolard. Progressive left  perirolandic venous infarction and petechial hemorrhage. New edema in the deep gray nuclei without infarction.  IR Thrombectomy: 02/15/2020 - complete revascularization of SSS,Straight S,V of [place] and ICVs ,and RT TS. Sig focal stenosis at the RT TS/SS junction angioplastied with balloon with  improved flow into RT SS.  MRI and MRV 02/18/20: acute/subacute left frontoparietal and right CC hemorrhagic venous infarct, b/l deep watershed white matter infarcts appear similar to the prior MRI. Flow within the SSS has significantly improved since the prior MRV of 02/15/2020. However, there are persistent foci of occlusive or nonocclusive thrombus within SSS, b/l TS, b/l SS, and right IJ. Degree of flow within deep venous system unchanged, veins of Trolard remain to be thrombosed  2D Echo - EF is 55 to 60%.  LDL 108  HgbA1c 4.9   Hypercoagulable panel: mildly decreased protein S, but may be an acute phase reactant and should be rechecked as an outpatient in 6 to 8 weeks.  Repeat COVID testing: negative on 02/14/20 and on 02/11/20  VTE prophylaxis - IV heparin  No antithrombotic prior to admission, now on IV heparin. 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  On keppra 1000 bid  Repeat EEG 02/16/20 - moderate to severe diffuse encephalopathy  Continue keppra  Respiratory failure  Intubated on sedation -> extubated 1/25  Tolerating well so far  Hypotension  BP on the low end  Phenylephrine started now off   On IVF and TF  BP goal 120-160  Long-term BP goal normotensive  Anemia - acute blood loss  Hgb - 11.0->8.2->8.6->7.2->7.0->8.2->8.0->8.2>9.4>8.9>9.5  May related to repeated procedure and IV heparin  1 unit PRBC transfusion on 02/16/2020  CBC monitoring  Hyperlipidemia  Home meds: None  LDL 108, goal < 70  Will consider statin when more stable  Continue statin at discharge  Dysphagia   Off OG 02/17/20  Thin liquid diet  Other Stroke Risk Factors  Sickle Cell Trait: No associated risk found due to sickle cell trait.    Other Active Problems  Asthma   Hypokalemia - potassium - 2.9->3.0->3.6->3.1 >3.8->2.8->3.4->3.1-> supplement ->3.2-> supplement  Hypophosphatemia - Phosphorus - 2.0->1.3->1.9>4.1  Leukocytosis - WBC's - 13.3->15.9 (afebrile) >15.2->12.3->10.2 (afebrile)  Mastoiditis -> Unasyn started 02/18/20  Hospital day # 10   02/22/2020 3:36 PM  ATTENDING NOTE: I reviewed above note and agree with the assessment and plan. Pt was seen and  examined. Imaging/MRI is also reviewed in person.  It appears that her cognition has improved. She does have mild psychomotor slowing but is responsive. She knows that she is in the hospital but is unable to state why. She has no complaints. She does follow commands briskly. She has global weakness 4/5. She is able to name all items presented at the bedside. No clear visual field deficits are noted.      To contact Stroke Continuity provider, please refer to [email or web address] After hours, contact General  Neurology

## 2020-02-22 NOTE — PMR Pre-admission (Addendum)
PMR Admission Coordinator Pre-Admission Assessment  Patient: Jill Clarke is an 35 y.o., female MRN: [id number] DOB: May 22, 1985 Height: 5' 5" (165.1 cm) Weight: 69.4 kg              Insurance Information HMO:     PPO:      PCP:      IPA:      80/20:      OTHER:  PRIMARY: [employer]      Policy#: [id number]      Subscriber: Pt. CM Name: via faxed approval      Phone#: via fax     Fax#: [phone] Pre-Cert#: [id number]      Employer:  Josem Kaufmann provided via fax from utilization department for admit to CIR. Pt is approved for 7 days with start date 02/24/20 and end date 03/01/20. Clinical updates to be faxed to [phone]  Benefits:  Phone #: [phone]     Name: NA Eff. Date: 08/24/2018 - 03/22/2020  Deductible: $0 OOP Max: $1,500 ($269.09 met) CIR: 80% coverage, 20% co insurance SNF: 80% coverage, 20% co-insurance: Limited to 60 days per cal. yr., Prior Auth required OP: 80% coverage, 20% co-insurance; 30 per calendar year combined therapies, Prior Auth required HH: 80% coverage, 20% co-insurance DME: 80% coverage, 20% co-insurance Prior Auth required  SECONDARY: none      Policy#:       Phone#:   The "Data Collection Information Summary" for patients in Inpatient Rehabilitation Facilities with attached "Privacy Act [place] Records" was provided and verbally reviewed with: N/A  Emergency Contact Information Contact Information    Name Relation Home Work Mobile   [place] Mother [phone]     Suzane, [place]   [phone]     Current Medical History  Patient Admitting Diagnosis: CVA History of Present Illness: HPI: 35 yo presented with AMS. MRI >  left frontoparietal hemorrhagic venous infarct (no significant changed 02/15/2020.),  hemorrhagic infarct within the right callosal, possible new punctate acute infarct within the left thalamocapsular junction, bilateral deep watershed white matter infarcts, persistent trace subarachnoid hemorrhage along  the left  frontoparietal convexity. Intubated 1/20-1/25. CXR Diffuse bilateral pulmonary infiltrates/edema. PMH: DM, sickle cell trait, asthma.  Complete NIHSS TOTAL: 2 Glasgow Coma Scale Score: 14  Past Medical History  Past Medical History:  Diagnosis Date  . Asthma    uses inhaler prn  . Diabetes mellitus without complication ([place])   . Sickle cell trait ([place])   . Unspecified high-risk pregnancy 01/27/2011    Family History  family history includes Asthma in her father; Depression in her mother; Diabetes in her maternal grandmother; Drug abuse in her father; Kidney disease in her father; Sickle cell trait in her mother.  Prior Rehab/Hospitalizations:  Has the patient had prior rehab or hospitalizations prior to admission? No  Has the patient had major surgery during 100 days prior to admission? Yes  Current Medications   Current Facility-Administered Medications:  .  acetaminophen (TYLENOL) tablet 650 mg, 650 mg, Oral, Q4H PRN, 650 mg at 02/27/20 1154 **OR** acetaminophen (TYLENOL) 160 MG/5ML solution 650 mg, 650 mg, Per Tube, Q4H PRN **OR** acetaminophen (TYLENOL) suppository 650 mg, 650 mg, Rectal, Q4H PRN, Deveshwar, Sanjeev, MD .  albuterol (PROVENTIL) (2.5 MG/3ML) 0.083% nebulizer solution 2.5 mg, 2.5 mg, Nebulization, Q4H PRN, Whiteheart, Kathryn A, NP .  amantadine (SYMMETREL) 50 MG/5ML solution 100 mg, 100 mg, Oral, BID, Rosalin Hawking, MD, 100 mg at 02/27/20 1009 .  Ampicillin-Sulbactam (UNASYN) 3 g in sodium chloride 0.9 %  100 mL IVPB, 3 g, Intravenous, Q6H, Garvin Fila, MD, Last Rate: 200 mL/hr at 02/27/20 1206, 3 g at 02/27/20 1206 .  atorvastatin (LIPITOR) tablet 40 mg, 40 mg, Oral, Daily, Dagar, Anjali, MD, 40 mg at 02/27/20 1009 .  bisacodyl (DULCOLAX) suppository 10 mg, 10 mg, Rectal, Daily PRN, Rigoberto Noel, MD, 10 mg at 02/18/20 1503 .  chlorhexidine (PERIDEX) 0.12 % solution 15 mL, 15 mL, Mouth Rinse, BID, Rosalin Hawking, MD, 15 mL at 02/27/20 1009 .  Chlorhexidine  Gluconate Cloth 2 % PADS 6 each, 6 each, Topical, Daily, Greta Doom, MD, 6 each at 02/26/20 1110 .  dabigatran (PRADAXA) capsule 150 mg, 150 mg, Oral, Q12H, Garvin Fila, MD, 150 mg at 02/27/20 1008 .  docusate (COLACE) 50 MG/5ML liquid 100 mg, 100 mg, Oral, BID, Rosalin Hawking, MD, 100 mg at 02/27/20 1009 .  feeding supplement (ENSURE ENLIVE / ENSURE PLUS) liquid 237 mL, 237 mL, Oral, BID BM, Rosalin Hawking, MD, 237 mL at 02/27/20 1026 .  levETIRAcetam (KEPPRA) tablet 500 mg, 500 mg, Oral, BID, Garvin Fila, MD, 500 mg at 02/27/20 1009 .  LORazepam (ATIVAN) injection 2 mg, 2 mg, Intravenous, Q1H PRN, Kipp Brood, MD, 2 mg at 02/18/20 1020 .  MEDLINE mouth rinse, 15 mL, Mouth Rinse, q12n4p, Rosalin Hawking, MD, 15 mL at 02/25/20 1635 .  pantoprazole (PROTONIX) EC tablet 40 mg, 40 mg, Oral, Daily, Garvin Fila, MD, 40 mg at 02/27/20 1009 .  polyethylene glycol (MIRALAX / GLYCOLAX) packet 17 g, 17 g, Oral, Daily, Rosalin Hawking, MD, 17 g at 02/27/20 1009 .  Resource ThickenUp Clear, , Oral, PRN, Rosalin Hawking, MD .  sodium chloride flush (NS) 0.9 % injection 10-40 mL, 10-40 mL, Intracatheter, Q12H, Garvin Fila, MD, 10 mL at 02/27/20 1027 .  sodium chloride flush (NS) 0.9 % injection 10-40 mL, 10-40 mL, Intracatheter, PRN, Garvin Fila, MD  Patients Current Diet:  Diet Order            Diet regular Room service appropriate? Yes; Fluid consistency: Thin  Diet effective now                 Precautions / Restrictions Precautions Precautions: Fall Precaution Comments: fall on 1/31 attempting to get OOB alone Restrictions Weight Bearing Restrictions: No   Has the patient had 2 or more falls or a fall with injury in the past year?No  Prior Activity Level Community (5-7x/wk): Pt. was actine in the commmunity PTA  Prior Functional Level Prior Function Level of Independence: Independent Comments: assumed  Self Care: Did the patient need help bathing, dressing, using the  toilet or eating?  Independent  Indoor Mobility: Did the patient need assistance with walking from room to room (with or without device)? Independent  Stairs: Did the patient need assistance with internal or external stairs (with or without device)? Independent  Functional Cognition: Did the patient need help planning regular tasks such as shopping or remembering to take medications? Independent  Home Assistive Devices / Equipment Home Assistive Devices/Equipment: None  Prior Device Use: Indicate devices/aids used by the patient prior to current illness, exacerbation or injury? None of the above  Current Functional Level Cognition  Arousal/Alertness:  (awake but "foggy") Overall Cognitive Status: Impaired/Different from baseline Difficult to assess due to: Level of arousal Current Attention Level: Sustained Orientation Level: Oriented to person,Oriented to place,Oriented to situation,Disoriented to time Following Commands: Follows one step commands consistently,Follows multi-step commands inconsistently Safety/Judgement: Decreased awareness  of deficits General Comments: pt continues to be impulsive attempting to get out of recliner with legs still elevated, pt very liable during session. pt starting to process all that she has been through and very grateful for abilty to be able to walk today with PT and wash her self with COTA. issued pt paper and pen as pt wants to try to process emotions through writing. encouraged pt to work on gratitude list Attention: Sustained Sustained Attention: Impaired Sustained Attention Impairment:  (will continue to assess) Awareness: Impaired Awareness Impairment: Intellectual impairment Problem Solving: Impaired Problem Solving Impairment: Functional basic Behaviors:  (flat affect) Safety/Judgment: Impaired    Extremity Assessment (includes Sensation/Coordination)  Upper Extremity Assessment: RUE deficits/detail RUE Deficits / Details: Poor AROM.  Weak grasp. Noting pt reaching to right when performing targeted reach at [place]. RUE Coordination: decreased fine motor,decreased gross motor LUE Deficits / Details: moving 4th and 5th digits on command. pt holding against gravity in painful stimuli of neck rotation  Lower Extremity Assessment: Defer to PT evaluation RLE Deficits / Details: no tone noted; PROM WNL LLE Deficits / Details: actively moved toes on command; PROM WFL    ADLs  Overall ADL's : Needs assistance/impaired Eating/Feeding: NPO Grooming: Wash/dry face,Sitting,Supervision/safety,Set up Grooming Details (indicate cue type and reason): from sitting in recliner in front of sink Upper Body Bathing: Supervision/ safety,Set up,Min guard,Standing Upper Body Bathing Details (indicate cue type and reason): min guard for dynamic standing tasks Lower Body Bathing: Min guard,Sit to/from stand Lower Body Bathing Details (indicate cue type and reason): minguard for dynamic LB bathing tasks with RUE supported on sink Upper Body Dressing : Minimal assistance,Sitting Upper Body Dressing Details (indicate cue type and reason): to don new gown Lower Body Dressing: Total assistance Toilet Transfer: Minimal assistance,Stand-pivot,BSC Toilet Transfer Details (indicate cue type and reason): MIN for balance and safety as pt attempting to sit prematurely Toileting- Clothing Manipulation and Hygiene: Supervision/safety,Set up,Min guard,Sit to/from stand Toileting - Clothing Manipulation Details (indicate cue type and reason): min guard for balance in standing Functional mobility during ADLs: Minimal assistance,Cueing for safety General ADL Comments: pt continues to present with impaired balance, impaired RUE strength and FMC, decreased activity tolerance    Mobility  Overal bed mobility: Needs Assistance Bed Mobility: Supine to Sit Supine to sit: Supervision General bed mobility comments: pt OOB in recliner and returned to recliner at end of  session    Transfers  Overall transfer level: Needs assistance Equipment used: None Transfers: Sit to/from Stand Sit to Stand: Min assist Stand pivot transfers: Mod assist General transfer comment: MOD A for stand pivot for balance and cues for hand placement as pt impulsively attempting to sit prematurely. min A to stand initially from recliner however progressing to min guard from for bathing task    Ambulation / Gait / Stairs / Wheelchair Mobility  Ambulation/Gait Ambulation/Gait assistance: Herbalist (Feet): 100 Feet (100 x 5, separated by dual-task activities) Assistive device: None Gait Pattern/deviations: Step-through pattern General Gait Details: posterior lean in standing; able to turn head side to side and up and down while ambulating without LOB; unable to increase speed from baseline; minimal ability to slow speed from baseline; difficulty following multi-step commands; easily distracted by environment Gait velocity: reduced Gait velocity interpretation: <1.8 ft/sec, indicate of risk for recurrent falls Stairs: Yes Stairs assistance: Min guard Stair Management: One rail Left,Step to pattern Number of Stairs: 3 General stair comments: pt with posterior LOB immediately prior to starting stairs, then  another posterior LOB after descending 2nd step    Posture / Balance Dynamic Sitting Balance Sitting balance - Comments: modA with cues, maxA intermittently due to impaired attention to balance Balance Overall balance assessment: Needs assistance Sitting-balance support: No upper extremity supported,Feet supported Sitting balance-Leahy Scale: Good Sitting balance - Comments: modA with cues, maxA intermittently due to impaired attention to balance Postural control: Left lateral lean,Right lateral lean,Posterior lean Standing balance support: No upper extremity supported Standing balance-Leahy Scale: Poor Standing balance comment: at least one UE supported during  bathing tasks at sink    Special needs/care consideration Skin: Dry skin at groin     Previous Home Environment (from acute therapy documentation) Living Arrangements: Other relatives Home Care Services: No Additional Comments: per chart was "in between places" and staying with friends  Discharge Living Setting Plans for Discharge Living Setting: House Type of Home at Discharge: House Discharge Home Layout: One level Discharge Home Access: Stairs to enter Entrance Stairs-Rails: Can reach both,Right,Left Entrance Stairs-Number of Steps: 4 Discharge Bathroom Shower/Tub: Tub/shower unit Discharge Bathroom Toilet: Handicapped height Discharge Bathroom Accessibility: Yes How Accessible: Accessible via walker Does the patient have any problems obtaining your medications?: Yes (Describe)  Social/Family/Support Systems Patient Roles: Parent Contact Information: [phone] Anticipated Caregiver: Tam Savoia (mother) Anticipated Caregiver's Contact Information: [phone] Ability/Limitations of Caregiver: Can provide min-mod A Caregiver Availability: 24/7 Discharge Plan Discussed with Primary Caregiver: Yes Is Caregiver In Agreement with Plan?: Yes Does Caregiver/Family have Issues with Lodging/Transportation while Pt is in Rehab?: No   Goals Patient/Family Goal for Rehab: PT: Mod I; OT: Supervision/mod I;  SLP: Supervision Expected length of stay: 9-14 days Pt/Family Agrees to Admission and willing to participate: Yes Program Orientation Provided & Reviewed with Pt/Caregiver Including Roles  & Responsibilities: Yes   Decrease burden of Care through IP rehab admission: NA  Possible need for SNF placement upon discharge: Not anticipated; pt has good social support at DC.    Patient Condition: This patient's medical and functional status has changed since the consult dated: 02/23/20 in which the Rehabilitation Physician determined and documented that the patient's condition  is appropriate for intensive rehabilitative care in an inpatient rehabilitation facility. See "History of Present Illness" (above) for medical update. Functional changes are: Pt currently supervision with mobility, Min A with transfers, Min A 100' with gait and supervision-min A with upper body ADLs and Min G with lower body ADLs.. Patient's medical and functional status update has been discussed with the Rehabilitation physician and patient remains appropriate for inpatient rehabilitation. Will admit to inpatient rehab today.  Preadmission Screen Completed By: Raechel Ache, OTR/L, with updates by Bethel Born, CCC-SLP, 02/27/2020 12:51 PM with plans to admit on 02/28/2020 ______________________________________________________________________   Discussed status with Dr. Posey Pronto on 02/27/20  at 12:51 PM and received approval for admission today.  Admission Coordinator: Raechel Ache, with day of admission updates by Bethel Born, time 12:51 PM Sudie Grumbling 02/27/20

## 2020-02-23 LAB — CBC
HCT: 29.5 % — ABNORMAL LOW (ref 36.0–46.0)
Hemoglobin: 9.6 g/dL — ABNORMAL LOW (ref 12.0–15.0)
MCH: 29.9 pg (ref 26.0–34.0)
MCHC: 32.5 g/dL (ref 30.0–36.0)
MCV: 91.9 fL (ref 80.0–100.0)
Platelets: 512 10*3/uL — ABNORMAL HIGH (ref 150–400)
RBC: 3.21 MIL/uL — ABNORMAL LOW (ref 3.87–5.11)
RDW: 15 % (ref 11.5–15.5)
WBC: 10.3 10*3/uL (ref 4.0–10.5)
nRBC: 0 % (ref 0.0–0.2)

## 2020-02-23 LAB — GLUCOSE, CAPILLARY
Glucose-Capillary: 103 mg/dL — ABNORMAL HIGH (ref 70–99)
Glucose-Capillary: 104 mg/dL — ABNORMAL HIGH (ref 70–99)
Glucose-Capillary: 110 mg/dL — ABNORMAL HIGH (ref 70–99)
Glucose-Capillary: 134 mg/dL — ABNORMAL HIGH (ref 70–99)
Glucose-Capillary: 134 mg/dL — ABNORMAL HIGH (ref 70–99)
Glucose-Capillary: 147 mg/dL — ABNORMAL HIGH (ref 70–99)
Glucose-Capillary: 96 mg/dL (ref 70–99)

## 2020-02-23 LAB — BASIC METABOLIC PANEL
Anion gap: 9 (ref 5–15)
BUN: <5 mg/dL — ABNORMAL LOW (ref 6–20)
CO2: 23 mmol/L (ref 22–32)
Calcium: 9.2 mg/dL (ref 8.9–10.3)
Chloride: 106 mmol/L (ref 98–111)
Creatinine, Ser: <0.3 mg/dL — ABNORMAL LOW (ref 0.44–1.00)
Glucose, Bld: 116 mg/dL — ABNORMAL HIGH (ref 70–99)
Potassium: 3.3 mmol/L — ABNORMAL LOW (ref 3.5–5.1)
Sodium: 138 mmol/L (ref 135–145)

## 2020-02-23 LAB — HEPARIN LEVEL (UNFRACTIONATED): Heparin Unfractionated: 0.45 IU/mL (ref 0.30–0.70)

## 2020-02-23 MED ORDER — DABIGATRAN ETEXILATE MESYLATE 150 MG PO CAPS
150.0000 mg | ORAL_CAPSULE | Freq: Two times a day (BID) | ORAL | Status: DC
  Administered 2020-02-23 – 2020-02-28 (×11): 150 mg via ORAL
  Filled 2020-02-23 (×12): qty 1

## 2020-02-23 MED ORDER — ATORVASTATIN CALCIUM 40 MG PO TABS
40.0000 mg | ORAL_TABLET | Freq: Every day | ORAL | Status: DC
  Administered 2020-02-23 – 2020-02-28 (×6): 40 mg via ORAL
  Filled 2020-02-23 (×6): qty 1

## 2020-02-23 NOTE — Progress Notes (Signed)
Inpatient Rehab Admissions Coordinator:   Pt.'s [employer] insurance policy terminated December 2021. I obtained phone number for benefits from pt.'s current employer and her mother plans to call and see if she can get information about a possible policy through them.   Clemens Catholic, [place], [place] Admissions Coordinator  [phone] ([place]) [phone] (office)

## 2020-02-23 NOTE — Discharge Summary (Shared)
Stroke Discharge Summary  Patient ID: Jill Clarke   MRN: [id number]      DOB: 01-Oct-1985  Date of Admission: 02/12/2020 Date of Discharge: *** Attending Physician:  Garvin Fila, MD, Stroke MD Consultant(s): CCM   Patient's PCP:  Pcp, No  Discharge Diagnoses:  Active Problems:   Acute cerebral venous sinus thrombosis   Cerebral thrombosis with cerebral infarction   Respiratory failure (HCC)   Middle cerebral artery embolism, left   Dural venous sinus thrombosis  Medications to be continued on Rehab Allergies as of 02/23/2020   No Known Allergies   Med Rec must be completed prior to using this [place]***      LABORATORY STUDIES CBC    Component Value Date/Time   WBC 10.3 02/23/2020 0450   RBC 3.21 (L) 02/23/2020 0450   HGB 9.6 (L) 02/23/2020 0450   HCT 29.5 (L) 02/23/2020 0450   PLT 512 (H) 02/23/2020 0450   MCV 91.9 02/23/2020 0450   MCH 29.9 02/23/2020 0450   MCHC 32.5 02/23/2020 0450   RDW 15.0 02/23/2020 0450   LYMPHSABS 2.5 02/16/2020 0419   MONOABS 1.3 (H) 02/16/2020 0419   EOSABS 0.0 02/16/2020 0419   BASOSABS 0.0 02/16/2020 0419   CMP    Component Value Date/Time   NA 138 02/23/2020 0450   K 3.3 (L) 02/23/2020 0450   CL 106 02/23/2020 0450   CO2 23 02/23/2020 0450   GLUCOSE 116 (H) 02/23/2020 0450   BUN <5 (L) 02/23/2020 0450   CREATININE <0.30 (L) 02/23/2020 0450   CREATININE 0.44 (L) 01/30/2011 1147   CALCIUM 9.2 02/23/2020 0450   PROT 8.3 (H) 02/12/2020 0127   ALBUMIN 3.6 02/12/2020 0127   AST 16 02/12/2020 0127   ALT 30 02/12/2020 0127   ALKPHOS 52 02/12/2020 0127   BILITOT 1.0 02/12/2020 0127   GFRNONAA NOT CALCULATED 02/23/2020 0450   GFRAA 60 (L) 12/01/2016 1806   COAGS Lab Results  Component Value Date   INR 1.2 02/12/2020   Lipid Panel    Component Value Date/Time   CHOL 148 02/13/2020 0333   TRIG 86 02/17/2020 0500   HDL 28 (L) 02/13/2020 0333   CHOLHDL 5.3 02/13/2020 0333   VLDL 12 02/13/2020 0333    LDLCALC 108 (H) 02/13/2020 0333   HgbA1C  Lab Results  Component Value Date   HGBA1C 4.9 02/13/2020   Urinalysis    Component Value Date/Time   COLORURINE YELLOW 02/12/2020 0701   APPEARANCEUR CLOUDY (A) 02/12/2020 0701   LABSPEC 1.024 02/12/2020 0701   PHURINE 6.0 02/12/2020 0701   GLUCOSEU 50 (A) 02/12/2020 0701   HGBUR NEGATIVE 02/12/2020 0701   BILIRUBINUR NEGATIVE 02/12/2020 0701   KETONESUR 80 (A) 02/12/2020 0701   PROTEINUR 100 (A) 02/12/2020 0701   UROBILINOGEN 0.2 09/23/2011 1722   NITRITE NEGATIVE 02/12/2020 0701   LEUKOCYTESUR NEGATIVE 02/12/2020 0701   Urine Drug Screen     Component Value Date/Time   LABOPIA NONE DETECTED 02/12/2020 0701   COCAINSCRNUR NONE DETECTED 02/12/2020 0701   LABBENZ NONE DETECTED 02/12/2020 0701   AMPHETMU NONE DETECTED 02/12/2020 0701   THCU NONE DETECTED 02/12/2020 0701   LABBARB NONE DETECTED 02/12/2020 0701    Alcohol Level    Component Value Date/Time   ETH <10 02/12/2020 0417   SIGNIFICANT DIAGNOSTIC STUDIES DG Abd 1 View  Result Date: 02/14/2020 CLINICAL DATA:  OG tube placement. EXAM: ABDOMEN - 1 VIEW COMPARISON:  None. FINDINGS: Enteric tube tip and side-port  project over the stomach. Lung bases are clear. IMPRESSION: Enteric tube tip and side-port project over the stomach. Electronically Signed   By: Annia Belt M.D.   On: 02/14/2020 13:07   CT Head Wo Contrast  Result Date: 02/16/2020 CLINICAL DATA:  Follow-up examination for acute stroke. EXAM: CT HEAD WITHOUT CONTRAST TECHNIQUE: Contiguous axial images were obtained from the base of the skull through the vertex without intravenous contrast. COMPARISON:  Prior CT and MRI from 02/15/2020. FINDINGS: Brain: Continued interval decrease in conspicuity of scattered small volume subarachnoid hemorrhage involving both frontal parietal convexities, now only faintly visible and nearly resolved. No significant basilar cistern hemorrhage now evident. Continued interval evolution of  bilateral watershed territory infarcts, most pronounced at the left frontoparietal convexity. Overall size and distribution is relatively similar. No hemorrhagic transformation evident by CT. Previously noted edema involving the deep gray nuclei difficult to visualize by CT, although there are suspected persistent changes. No new hemorrhage or acute ischemic changes. No midline shift or significant mass effect. No hydrocephalus or extra-axial fluid collection. Vascular: Persistent but improved hyperdensity seen about the veins of true large since prior CT. Likewise, the superior sagittal sinus appears less dense and engorged as compared to previous. Remainder of the dural sinuses also appear less dense. No hyperdense arterial vessel. Skull: Scalp soft tissues demonstrate no new finding. Calvarium unchanged. Sinuses/Orbits: Globes and orbital soft tissues within normal limits. Extensive mucosal thickening with air-fluid levels noted throughout the paranasal sinuses similar to previous. Trace right mastoid effusion. Other: None. IMPRESSION: 1. Continued interval decrease in conspicuity of scattered small volume subarachnoid hemorrhage involving both frontoparietal convexities, now only faintly visible and nearly resolved. 2. Continued interval evolution of bilateral watershed territory infarcts, most pronounced at the left frontoparietal convexity. Overall size and distribution is relatively similar. No hemorrhagic transformation by CT. 3. Persistent but improved hyperdensity about the veins of [place] since prior CT. Likewise, the remainder of the dural sinuses also appear less dense and engorged as compared to previous. 4. No other new acute intracranial abnormality. Electronically Signed   By: Rise Mu M.D.   On: 02/16/2020 00:59   CT HEAD WO CONTRAST  Result Date: 02/14/2020 CLINICAL DATA:  Stroke, follow up EXAM: CT HEAD WITHOUT CONTRAST TECHNIQUE: Contiguous axial images were obtained from the  base of the skull through the vertex without intravenous contrast. COMPARISON:  02/12/2020. FINDINGS: Brain: Decreased conspicuity of small volume subarachnoid hemorrhage along the bilateral frontoparietal regions within the basilar cistern. No new site of intracranial hemorrhage. Known small bilateral cerebral infarcts are not well demonstrated on this exam. Increased conspicuity of left cerebral edema. No midline shift or mass lesion.  No ventriculomegaly. Vascular: Hyperdense appearance of the cortical veins, posterosuperior sagittal sinus and right transverse sinus is less conspicuous than prior exam. No hyperdense arterial vessels. Skull: Negative for fracture or focal lesion. Sinuses/Orbits: No acute finding. Mild pansinus mucosal thickening with layering secretions. Other: None. IMPRESSION: Small volume bilateral frontoparietal and basilar cistern subarachnoid hemorrhage is unchanged. No new intracranial hemorrhage. Small bilateral cerebral infarcts are not well demonstrated on this exam. Increased conspicuity of left cerebral edema. Hyperdense appearance of the cortical veins, superior sagittal and right transverse sinus is less conspicuous than prior exam. Electronically Signed   By: Stana Bunting M.D.   On: 02/14/2020 09:58   CT Head Wo Contrast  Result Date: 02/12/2020 CLINICAL DATA:  Mental status change. EXAM: CT HEAD WITHOUT CONTRAST TECHNIQUE: Contiguous axial images were obtained from the base of the  skull through the vertex without intravenous contrast. COMPARISON:  None. FINDINGS: Brain: There is a small volume of subarachnoid hemorrhage bilaterally, most evident in the right frontal parietal region (axial series 3, image 14). No midline shift. The ventricular system is unremarkable.There is a small volume of extra-axial hemorrhage in the basal cisterns. The gray-white differentiation is unremarkable. The cortical veins appear hyperdense and somewhat enlarged. There is a questionable  thrombus within the right transverse sinus. Vascular: There is no hyperdense arterial structure. Skull: The calvarium is unremarkable. The skull base is unremarkable. The visualized upper cervical spine is unremarkable. Sinuses/Orbits: The visualized orbits are unremarkable. The paranasal sinuses are unremarkable. The mastoid air cells are clear. Other: The visualized parotid gland is unremarkable. There is no scalp soft tissue swelling. IMPRESSION: 1. The study is positive for a small volume of subarachnoid hemorrhage. There is no midline shift. 2. Hyperdense dural sinuses and cortical veins especially on the right in addition to a filling defect within the transverse sinus on the right is concerning for dural venous thrombosis and may be the source of the patient's subarachnoid hemorrhage. Emergent MRI/MRV is recommended for further evaluation. These results were called by telephone at the time of interpretation on 02/12/2020 at 4:10 am to provider Veryl Speak , who verbally acknowledged these results. Electronically Signed   By: Constance Holster M.D.   On: 02/12/2020 04:13   MR BRAIN WO CONTRAST  Result Date: 02/18/2020 CLINICAL DATA:  Stroke, follow-up. EXAM: MRI HEAD WITH CONTRAST MRV HEAD WITHOUT AND WITH CONTRAST TECHNIQUE: Multiplanar, multiecho pulse sequences of the brain and surrounding structures were obtained with intravenous contrast. Angiographic images of the intracranial venous structures were obtained using MRV technique without and with intravenous contrast. COMPARISON:  Head CT 02/16/2020, report from thrombectomy 02/15/2020, MRI/MRV head 02/15/2020 CONTRAST:  50mL GADAVIST GADOBUTROL 1 MMOL/ML IV SOLN FINDINGS: MR HEAD FINDINGS Brain: The examination is intermittently motion degraded, limiting evaluation. Most notably, there is severe motion degradation of the sagittal T1 weighted sequence and moderate motion degradation of the coronal T2 weighted sequence. The dominant acute/subacute  hemorrhagic venous infarct within the left frontoparietal lobes has not significantly changed. Unchanged local mass effect with partial effacement of the left lateral ventricle. Restricted diffusion at site of an acute/early subacute hemorrhagic infarct within the right callosal splenium has subtly increased in extent. Persistent although decreased T2/FLAIR hyperintensity within the bilateral basal ganglia and thalami. Possible new punctate acute infarct within the left thalamus capsular junction (series 4, image 24). Bilateral acute/early subacute deep watershed white matter infarcts otherwise appear similar in distribution and extent. Persistent trace subarachnoid hemorrhage along the left frontoparietal convexity. No midline shift. Vascular: Expected proximal arterial flow voids. Skull and upper cervical spine: Within limitations of motion degradation, no focal marrow lesion is identified. Sinuses/Orbits: Visualized orbits show no acute finding. Fluid within the posterior ethmoid air cells and sphenoid sinuses bilaterally. Right mastoid effusion. MRV HEAD FINDINGS Flow within the superior sagittal sinus has significantly improved since the prior MRV of 02/15/2020. However, there are persistent foci of nonocclusive thrombus within this vessel. Nonocclusive thrombus within the left transverse and proximal sigmoid dural venous sinuses has increased. Persistent nonocclusive thrombus within the distal right transverse sinus. Persistent near occlusive or occlusive thrombus within the right sigmoid sinus and visualized right internal jugular vein. The degree of flow within the deep venous system is unchanged. The veins of [place] and a few additional cerebral veins overlying the bilateral frontoparietal convexities remain thrombosed (best appreciated on the axial  precontrast T1 weighted sequence). MRV impression #2 will be called to the ordering clinician or representative by the Radiologist Assistant, and communication  documented in the PACS or [employer]. IMPRESSION: MRI brain: 1. Motion degraded exam. 2. 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 3. Restricted diffusion at site of an acute/subacute hemorrhagic infarct within the right callosal splenium has subtly increased in extent. 4. T2/FLAIR hyperintensity within the deep gray nuclei persists but has diminished. However, there may be a new punctate acute infarct within the left thalamocapsular junction. 5. 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 6. Persistent trace subarachnoid hemorrhage along the left frontoparietal convexity. MRV head: 1. Flow within the superior sagittal sinus has significantly improved since the prior MRV of 02/15/2020. However, there are persistent foci of nonocclusive thrombus within this vessel. 2. Nonocclusive thrombus within the left transverse and proximal sigmoid dural venous sinuses has increased. 3. Persistent nonocclusive thrombus within the right transverse sinus. 4. Persistent near occlusive or occlusive thrombus within the right sigmoid sinus and visualized right internal jugular vein. 5. The degree of flow within the deep venous system is unchanged. 6. The veins of [place] and a few additional cerebral veins overlying the bilateral frontoparietal convexities remain thrombosed. Electronically Signed   By: Kellie Simmering DO   On: 02/18/2020 11:38   MR BRAIN WO CONTRAST  Result Date: 02/15/2020 CLINICAL DATA:  Follow-up dural sinus thrombosis. Interval heparin drip and mechanical thrombectomy. EXAM: MRI HEAD WITHOUT CONTRAST MRV HEAD WITHOUT CONTRAST TECHNIQUE: Multiplanar, multiecho pulse sequences of the brain and surrounding structures were obtained without intravenous contrast. Angiographic images of the intracranial venous structures were obtained using MRV technique without  intravenous contrast. COMPARISON:  Brain MRI from 3 days ago FINDINGS: MRI HEAD WITHOUT CONTRAST Brain: Bilateral deep watershed white matter infarcts which appears similar in extent. The degree of restricted diffusion appears greater in these infarcts, compatible with interval evolution. Venous infarct along the high left central sulcus is progressed in extent. Small infarct in the right paramedian splenium of the corpus callosum with mild increase. T2 signal without restricted diffusion in the right more than left basal ganglia and in the bilateral thalamus, progressed. There is increased FLAIR signal along the cerebral convexities which may be from engorged vessels or the subarachnoid blood seen on admission CT. On gradient imaging there is less vessel congestion suspected. Increased petechial hemorrhage along the left-sided venous infarct. Vascular: T1 hyperintense evolution of the dural venous sinus thrombosis which is most confluent in the superior sagittal sinus, right transverse sinus, and right sigmoid sinus. Clot is also seen in the bilateral vein of [place]. Skull and upper cervical spine: Normal marrow signal Sinuses/Orbits: Negative MR VENOGRAM WITHOUT CONTRAST There has been improvement of venous flow after thrombectomy. Improvements are primarily seen at the level of the torcula and proximal right transverse sinus. There is also now straight sinus and internal cerebral vein flow. Occlusive thrombus is still seen at the level of the superior sagittal sinus, distal right transverse, and right sigmoid sinuses. Veins of [place] remain thrombosed, best seen by noncontrast T1 weighted imaging. IMPRESSION: Brain MRI: 1. Progressive left perirolandic venous infarction and petechial hemorrhage. 2. New edema in the deep gray nuclei without infarction. Intracranial MRV: 1. Improved flow especially in the deep venous system. Improved flow also seen in the proximal right transverse sinus. 2. Occlusive clot still  seen in the superior sagittal, distal  right transverse, and right sigmoid dural sinuses. No recanalization of the veins of [place]. Electronically Signed   By: Monte Fantasia M.D.   On: 02/15/2020 05:10   MR BRAIN WO CONTRAST  Result Date: 02/12/2020 CLINICAL DATA:  Mental status change with unknown cause. EXAM: MRI HEAD WITHOUT CONTRAST MRV HEAD WITHOUT CONTRAST TECHNIQUE: Multiplanar, multiecho pulse sequences of the brain and surrounding structures were obtained without intravenous contrast. Angiographic images of the intracranial venous structures were obtained using MRV technique without intravenous contrast. COMPARISON:  Head CT from earlier today FINDINGS: MRI HEAD WITHOUT CONTRAST Brain: Small patchy acute infarcts in the bilateral cerebral white matter and along the high left sylvian fissure where there is likely accentuation by susceptibility artifact. These are in a deep watershed pattern. Limited sulci, even for age which likely reflects diffuse swelling. Focal swelling is seen to a greater extent than the infarction along the high perirolandic cortex, left more than right. Small volume subarachnoid hemorrhage as seen on head CT, mainly at the areas of greatest swelling. There is marked intravascular susceptibility signal involving the right more than left cortical, dural sinus, and deep veins due to intravascular deoxygenation/slow flow. No hydrocephalus. Vascular: Dural venous sinus thrombosis with further description below. Skull and upper cervical spine: No focal marrow signal abnormality Sinuses/Orbits: Negative MR VENOGRAM WITHOUT CONTRAST Abrupt occlusion of the mid superior sagittal sinus with confluent thrombus extending to the torcula, right transverse, right sigmoid, and right upper internal jugular vein. Bilateral vein of [place] thrombosis. The straight sinus and vein of [place] are also non flowing. No internal cerebral flow on either side. Critical Value/emergent results were called by  telephone at the time of interpretation on 02/12/2020 at 6:49 am to provider Veryl Speak , who verbally acknowledged these results. IMPRESSION: 1. Extensive, acute dural venous sinus thrombosis involving the superior sagittal sinus into the right internal jugular vein via the dominant right transverse and sigmoid system. Both veins of [place] are thrombosed and there is straight sinus/deep cerebral thrombosis as well. 2. Deep watershed white matter infarcts, small volume subarachnoid hemorrhage, and left more than right frontoparietal cortical swelling due to #1. Electronically Signed   By: Monte Fantasia M.D.   On: 02/12/2020 06:50   MR Venogram Head  Result Date: 02/15/2020 CLINICAL DATA:  Follow-up dural sinus thrombosis. Interval heparin drip and mechanical thrombectomy. EXAM: MRI HEAD WITHOUT CONTRAST MRV HEAD WITHOUT CONTRAST TECHNIQUE: Multiplanar, multiecho pulse sequences of the brain and surrounding structures were obtained without intravenous contrast. Angiographic images of the intracranial venous structures were obtained using MRV technique without intravenous contrast. COMPARISON:  Brain MRI from 3 days ago FINDINGS: MRI HEAD WITHOUT CONTRAST Brain: Bilateral deep watershed white matter infarcts which appears similar in extent. The degree of restricted diffusion appears greater in these infarcts, compatible with interval evolution. Venous infarct along the high left central sulcus is progressed in extent. Small infarct in the right paramedian splenium of the corpus callosum with mild increase. T2 signal without restricted diffusion in the right more than left basal ganglia and in the bilateral thalamus, progressed. There is increased FLAIR signal along the cerebral convexities which may be from engorged vessels or the subarachnoid blood seen on admission CT. On gradient imaging there is less vessel congestion suspected. Increased petechial hemorrhage along the left-sided venous infarct. Vascular:  T1 hyperintense evolution of the dural venous sinus thrombosis which is most confluent in the superior sagittal sinus, right transverse sinus, and right sigmoid sinus. Clot is also seen in the  bilateral vein of [place]. Skull and upper cervical spine: Normal marrow signal Sinuses/Orbits: Negative MR VENOGRAM WITHOUT CONTRAST There has been improvement of venous flow after thrombectomy. Improvements are primarily seen at the level of the torcula and proximal right transverse sinus. There is also now straight sinus and internal cerebral vein flow. Occlusive thrombus is still seen at the level of the superior sagittal sinus, distal right transverse, and right sigmoid sinuses. Veins of [place] remain thrombosed, best seen by noncontrast T1 weighted imaging. IMPRESSION: Brain MRI: 1. Progressive left perirolandic venous infarction and petechial hemorrhage. 2. New edema in the deep gray nuclei without infarction. Intracranial MRV: 1. Improved flow especially in the deep venous system. Improved flow also seen in the proximal right transverse sinus. 2. Occlusive clot still seen in the superior sagittal, distal right transverse, and right sigmoid dural sinuses. No recanalization of the veins of [place]. Electronically Signed   By: Monte Fantasia M.D.   On: 02/15/2020 05:10   MR Venogram Head  Result Date: 02/12/2020 CLINICAL DATA:  Mental status change with unknown cause. EXAM: MRI HEAD WITHOUT CONTRAST MRV HEAD WITHOUT CONTRAST TECHNIQUE: Multiplanar, multiecho pulse sequences of the brain and surrounding structures were obtained without intravenous contrast. Angiographic images of the intracranial venous structures were obtained using MRV technique without intravenous contrast. COMPARISON:  Head CT from earlier today FINDINGS: MRI HEAD WITHOUT CONTRAST Brain: Small patchy acute infarcts in the bilateral cerebral white matter and along the high left sylvian fissure where there is likely accentuation by susceptibility  artifact. These are in a deep watershed pattern. Limited sulci, even for age which likely reflects diffuse swelling. Focal swelling is seen to a greater extent than the infarction along the high perirolandic cortex, left more than right. Small volume subarachnoid hemorrhage as seen on head CT, mainly at the areas of greatest swelling. There is marked intravascular susceptibility signal involving the right more than left cortical, dural sinus, and deep veins due to intravascular deoxygenation/slow flow. No hydrocephalus. Vascular: Dural venous sinus thrombosis with further description below. Skull and upper cervical spine: No focal marrow signal abnormality Sinuses/Orbits: Negative MR VENOGRAM WITHOUT CONTRAST Abrupt occlusion of the mid superior sagittal sinus with confluent thrombus extending to the torcula, right transverse, right sigmoid, and right upper internal jugular vein. Bilateral vein of [place] thrombosis. The straight sinus and vein of [place] are also non flowing. No internal cerebral flow on either side. Critical Value/emergent results were called by telephone at the time of interpretation on 02/12/2020 at 6:49 am to provider Veryl Speak , who verbally acknowledged these results. IMPRESSION: 1. Extensive, acute dural venous sinus thrombosis involving the superior sagittal sinus into the right internal jugular vein via the dominant right transverse and sigmoid system. Both veins of [place] are thrombosed and there is straight sinus/deep cerebral thrombosis as well. 2. Deep watershed white matter infarcts, small volume subarachnoid hemorrhage, and left more than right frontoparietal cortical swelling due to #1. Electronically Signed   By: Monte Fantasia M.D.   On: 02/12/2020 06:50   IR THROMBECT VENO [facility] MOD SED  Result Date: 02/18/2020 INDICATION: Stuporous.  Unresponsive.  Occluded dural venous sinuses. EXAM: 1. EMERGENT LARGE VESSEL OCCLUSION THROMBOLYSIS (POSTERIOR CIRCULATION) COMPARISON:  MRI  brain February 12, 2020. MRI venogram of February 12, 2020. MEDICATIONS: Ancef 2 g IV antibiotic was administered within 1 hour of the procedure. ANESTHESIA/SEDATION: General anesthesia CONTRAST:  Isovue 300 approximately 200 mL. FLUOROSCOPY TIME:  Fluoroscopy Time: 138 minutes 24 seconds (3981 mGy). COMPLICATIONS: None immediate.  TECHNIQUE: Following a full explanation of the procedure along with the potential associated complications, an informed witnessed consent was obtained from the patient's mother. The risks of intracranial hemorrhage of 10%, worsening neurological deficit, ventilator dependency, death and inability to revascularize were all reviewed in detail with the patient's mother. The patient was then put under general anesthesia by the Department of Anesthesiology at [facility]. The right groin was prepped and draped in the usual sterile fashion. Thereafter using modified Seldinger technique, transfemoral access into the right common femoral vein was obtained without difficulty. Over a 0.035 inch guidewire a 5 French Pinnacle sheath was inserted. Through this, and also over a 0.035 inch guidewire a 5 [place] JB 1 catheter was advanced and positioned in the left internal jugular vein to the cranial skull base. The guidewire was removed. Good aspiration was obtained from hub of the diagnostic catheter. A gentle control arteriogram confirmed safe position of tip of the JB 1 diagnostic catheter. Over a 0.035 inch 300 cm Rosen exchange guidewire, an 8 [place] Neuron Max sheath was then inserted after removal of the 5 [place] sheath. The tip of the Neuron Max guide sheath was positioned in the internal jugular vein at the skull base. This was then connected to continuous heparinized saline infusion. Through the left groin, access into the left common femoral artery obtained over a 0.018 inch micro guidewire and a micropuncture set. Over a 0.035 inch J guidewire, a 5 [place] JB 1 catheter was advanced to  the aortic arch region, and selectively positioned in the right common carotid artery, the right vertebral artery and the left common carotid artery. Finally the tip of the diagnostic catheter was positioned in the proximal left internal carotid artery and connected to continuous heparinized saline infusion. FINDINGS: The right common carotid arteriogram demonstrates the right external carotid artery and its major branches to be widely patent. The right internal carotid artery at the bulb to the cranial skull base opacifies widely. The petrous, cavernous and supraclinoid segments are widely patent. The right middle cerebral artery and the right anterior cerebral artery opacify into the capillary phase. The delayed venous phase demonstrates engorged cortical veins in the entirety of the right cerebral hemisphere with stagnation of flow in the parasagittal venous systems of the posterior 2/3 of the superior sagittal is noted. No significant opacification is noted in the transverse sinuses or sigmoid sinuses or marginal sinuses. Prominence of the vein of Labbe is seen projecting into a laterally positioned engorged vein running parallel to the occluded internal jugular vein. The right vertebral artery origin is widely patent. Vessel is seen to opacify normally to the cranial skull base. Patency is maintained of the right posterior-inferior cerebral artery and the right vertebrobasilar junction. The posterior cerebral arteries, the superior cerebellar arteries and the anterior-inferior cerebellar arteries opacify into the capillary and venous phases. The venous phase demonstrates opacification of the distal 2/3 of the left transverse sinus, the left sigmoid sinus and the left internal jugular vein. There is no opacification of the torcula or the inferior sagittal sinus, or the right transverse or right sigmoid sinus. The left common carotid arteriogram demonstrates the left external carotid artery and its major branches  to be widely patent. The left internal carotid artery at the bulb to the cranial skull base is widely patent. The petrous, cavernous and supraclinoid segments are functional. The left middle cerebral artery and the left anterior cerebral artery opacify into the capillary phase with delayed exit of contrast in  the capillary phase with attenuated caliber of the cortical veins. No opacification is seen of the distal 2/3 of the superior sagittal sinus, the torcula or the right-sided dural venous sinuses. There was no significant opacification seen of the left transverse sinus either. However, there is opacification of vein of the Labbe, and the posterior temporal vein which project into the left transverse sinus sigmoid sinus junction. Patency of the left sigmoid sinus is noted. Lateral to this vein projecting parallel to the internal jugular vein. PROCEDURE: Arterial injection through the left common carotid artery into the venous phase were utilized for venous road maps. Over a 0.035 inch Roadrunner guidewire a 105 cm 071 Benchmark guide catheter was advanced to the distal end of the 8 [place] Neuron Max sheath. The 035 inch Roadrunner guidewire was easily advanced through the left internal jugular bulb, the left sigmoid sinus, the left transverse sinus and into mid right transverse sinus followed by the Benchmark catheter. The guidewire was removed. Resistance to aspiration was noted. Through the [facility] guide sheath, over a 0.014 inch standard Synchro micro guidewire, with a J configuration a Marksman 027 microcatheter was then advanced without difficulty to the right transverse sinus sigmoid sinus junction. Using a torque device the micro guidewire was eventually advanced into the distal right sigmoid sinus at the junction with the internal jugular bulb followed by the microcatheter. The guidewire was removed. Safe position of the tip of the microcatheter was confirmed. A 6 mm x 45 mm Solitaire X retrieval device  was then advanced to the distal end of the microcatheter and deployed in the usual manner by unsheathing the microcatheter. Constant aspiration was then applied at the hub of the 6 [place] guide catheter now positioned in the distal right transverse sinus as gradually the retrieval device was retrieved into the guide sheath and removed. The guide sheath was also retrieved more proximally into the left sigmoid sinus. Copious amounts of clot were noted in the aspirate, and also on the stent retriever. Multiple passes were then made again as described above with access obtained into the right sigmoid sinus and the right internal jugular vein followed by deployment of the 6 x 45 retriever. This was then followed by slow aspiration at the hub of the Benchmark catheter which continued to retrieve into the left sigmoid sinus. Again 3 or 4 passes were made with copious amounts of clot retrieved into the canister and also in the aspirate, and in the retrieval device. Eventually arterial injection in the venous phase demonstrated significantly improved caliber of the distal right transverse sinus and the left transverse sinus. A control arteriogram performed now demonstrates significant decrease in the stagnation of flow in the cortical veins and the left cerebral hemisphere in general. Vein of [place]rolard was now noted to be filling though with nonocclusive filling defects consistent with clot. No significant improvement was noted in the superior sagittal sinus. Flow was also noted to be more brisk in the vein of Labbe and the left posterior temporal vein. Control arteriograms also demonstrated flow into the now recanalized right transverse sinus and to the left transverse sinus and egress into the left sigmoid sinus and left internal jugular vein. Despite multiple attempts with the microcatheter and retriever in the right sigmoid sinus, no significant opacification was realized despite removal of clot. Advancement at the right  sigmoid sinus transverse sinus junction was met with significant resistance raising the possibly of an underlying severe transverse sinus sigmoid sinus stenosis. Multiple attempts were also made  to access the superior sagittal sinus through the torcula which was met with significant resistance. Multiple attempts were made to access the superior sagittal sinus, and the torcula. Access was obtained into the straight sinus, and with retriever and aspiration clot was retrieved from this site. In view of the amount of contrast utilized, and the restoration of antegrade flow in the right transverse sinus, the left transverse sinus sigmoid sinus, and significantly improved flow through the left cerebral hemisphere, the procedure was stopped. A CT of the brain demonstrated no evidence of intracranial hemorrhages, or mass effect. There appeared to be generalized improvement in the left cerebral hemispheric swelling. 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 On the CT also noted was the posterior cerebral veins, the vein of Galen and the straight sinus. The 8 [place] Neuron Max was removed and replaced with a short 8 [place] Pinnacle sheath which was connected to continuous heparinized saline infusion. The 5 French left common femoral artery sheath was left in-situ, and be readily available in case of a second treatment. Patient was left intubated and returned to the floor in stable neurologic condition. IMPRESSION: Status post endovascular revascularization of occluded right transverse sinus, left transverse sinus and partially the right sigmoid sinus with significant improved flow and decompression of the left cerebral hemispheric venous system. Partial recanalization of the vein of Trolard noted. PLAN: Follow patient clinically for signs of decrease in intracranial pressure, increased improvement and improved sensorium. Probable repeat MRI of the brain and MRA of the brain in a day or  two. Continue with IV heparin treatment. Electronically Signed   By: Luanne Bras M.D.   On: 02/17/2020 10:40   IR THROMBECT VENO MECH REPT MOD SED  Result Date: 02/18/2020 INDICATION: Stuporous.  Dural venous sinus thrombosis. EXAM: 1. EMERGENT LARGE VESSEL OCCLUSION THROMBOLYSIS (POSTERIOR CIRCULATION) COMPARISON:  MRI MRV of February 15, 2020. MEDICATIONS: Ancef 2 g IV antibiotic was administered within 1 hour of the procedure. ANESTHESIA/SEDATION: General anesthesia CONTRAST:  Isovue 300 100 mL FLUOROSCOPY TIME:  Fluoroscopy Time: 96 minutes 12 seconds (3924 mGy). COMPLICATIONS: None immediate. TECHNIQUE: Following a full explanation of the procedure along with the potential associated complications, an informed witnessed consent was obtained. The risks of intracranial hemorrhage of 10%, worsening neurological deficit, ventilator dependency, death and inability to revascularize were all reviewed in detail with the patient's mother. The patient was then put under general anesthesia by the Department of Anesthesiology at [facility] The in situ right common femoral vein 8 [place], and in situ left common femoral arterial French sheath were exchanged over a 0.035 inch guidewire for a new sheath which were then connected to continuous heparinized saline infusion. Over a 0.035 inch Roadrunner guidewire, a 5 [place] JB 1 catheter was advanced to the aortic arch region and positioned in the right common carotid artery. Through the right common femoral vein, over a 0.035 inch Roadrunner guidewire, a 5 French catheter was administered to the right internal jugular vein to the skull base. This in turn was then exchanged over a 0.035 inch 300 cm Constance Holster exchange guidewire for an 8 French 80 cm Neuron Max sheath. The guidewire was removed. Good aspiration obtained from the hub of the Neuron Max sheath. A gentle control arteriogram performed through this demonstrates safe position of the tip of the Neuron Max  sheath. This was then connected to continuous heparinized saline infusion. FINDINGS: The right common carotid arteriogram demonstrates the right external carotid artery and its  major branches to be widely patent. The right internal carotid artery at the bulb to the cranial skull base is widely patent. The petrous, cavernous and supraclinoid segments are also widely patent. The right middle cerebral artery and the right anterior cerebral opacify into the capillary and venous phases. The venous phase demonstrates again significantly decreased venous congestion overlying both cerebral convexities. Patency is noted of the right transverse sinus to the right transverse sinus sigmoid sinus junction. This vein of Labbe and the posterior temporal vein are patent draining into venous channel running parallel to the to the occluded right internal jugular vein. Patency is maintained at the torcula, the left transverse sinus, the left sigmoid sinus and the left internal jugular vein. PROCEDURE: An 071 95 cm Benchmark catheter was advanced through the Neuron Max guide sheath to the left internal jugular occluded vein. An 035 inch Roadrunner wire was first advanced without difficulty into the right sigmoid sinus and the distal right transverse sinus. However, advancement of the 6 [place] Benchmark was met with significant resistance. At this time, in a coaxial manner and with constant heparinized saline infusion over a 016 inch Fathom micro guidewire, a Marksman 027 microcatheter was advanced without difficulty to the torcula. The guidewire was removed. Control arteriogram was then performed from the proximal right transverse sinus. This confirmed brisk flow through the right transverse and left transverse sinuses. This also demonstrated the partially occluded on the torcula. The 016 micro guidewire was reintroduced, and the torque device was advanced without difficulty into the distal 1/3 of the superior sagittal sinus and  eventually into the junction of the anterior and the middle 1/3. Micro guidewire was removed. Good aspiration was obtained from the hub of the microcatheter. A gentle control arteriogram performed through this demonstrated extensive occlusion of the anterior 2/3 of the superior sagittal sinus. Through the microcatheter, a 6 mm x 40 mm Solitaire X retrieval device was advanced and deployed through the microcatheter. The microcatheter was then gently retrieved. The 6 [place] Benchmark guide catheter was then advanced to just inside the torcula having the hub at the right groin. At this time as constant aspiration was applied through the Benchmark, the retrieval device and the microcatheter were retrieved and removed. Copious amounts of clot were seen in the canister, and also in the retrieval device. Three further passes were made with a similar arrangement resulting in more clot being removed. A final pass was then made through a 071 136 cm Zoom aspiration catheter which replaced the Benchmark catheter. This could be advanced more distally to the anterior third of the superior sagittal sinus. Constant aspiration was applied as the aspiration catheter was retrieved slowly more proximally and into the right transverse sinus. Again more clot was obtained especially in the canister. A control arteriogram performed through the diagnostic catheter in the right common carotid artery into the venous phase now demonstrated complete recanalization of the superior sagittal sinus, the right transverse sinus, the left transverse sinus, the left sigmoid sinus and the left internal jugular vein. Gentle control injection performed through the Zoom aspiration catheter in the mid right transverse sinus demonstrated significant stenosis of the right transverse sinus sigmoid sinus junction. Also noted was severely contracted right sigmoid sinus to the internal jugular vein. At this time, a 8 mm x 40 mm sterling balloon angioplasty  catheter was advanced over a 0.014 inch exchange micro guidewire. Balloon angioplasty was then performed extending from the distal 1/3 of the transverse sinus on the right side,  the transverse sinus sigmoid sinus junction, and the sigmoid sinus. At the end of this, a control arteriogram performed in the venous phase demonstrated marginally improved flow through the right sigmoid sinus into the now patent right internal jugular vein. However, there continued be prominent collateral just lateral to this at the level of the transverse sinus sigmoid sinus junction draining the posterior temporal vein, and the vein of Labbe. There continued be stenosis at the right transverse sinus sigmoid sinus junction partially improved. As venous grafts continued through superior sagittal sinus and transverse sinuses and the right sigmoid sinus and through a prominent collateral just into the right sigmoid sinus, it was elected to stop. 071 Zoom aspiration catheter, and the Neuron Max sheath were removed with hemostasis achieved at the right groin venous puncture site. The left femoral sheath was then removed and hemostasis achieved with a 5 [place] ExoSeal closure device. Distal pulses remained palpable. A CT of the brain demonstrated no gross subarachnoid hemorrhage or mass effect or midline shift. The patient was left intubated and transferred to the neuro ICU. IMPRESSION: Status post endovascular complete revascularization of entire superior sagittal sinus, the right transverse sinus, the left transverse sinus, the left sigmoid sinus, the left internal jugular vein, and partially the right sigmoid sinus and right internal jugular vein using the aspiration, and stent retrieval technique as described. PLAN: Follow-up in the clinic 4 to 6 weeks post discharge. Electronically Signed   By: Luanne Bras M.D.   On: 02/17/2020 13:36   IR CT Head Ltd  Result Date: 02/18/2020 INDICATION: Stuporous.  Dural venous sinus thrombosis.  EXAM: 1. EMERGENT LARGE VESSEL OCCLUSION THROMBOLYSIS (POSTERIOR CIRCULATION) COMPARISON:  MRI MRV of February 15, 2020. MEDICATIONS: Ancef 2 g IV antibiotic was administered within 1 hour of the procedure. ANESTHESIA/SEDATION: General anesthesia CONTRAST:  Isovue 300 100 mL FLUOROSCOPY TIME:  Fluoroscopy Time: 96 minutes 12 seconds (3924 mGy). COMPLICATIONS: None immediate. TECHNIQUE: Following a full explanation of the procedure along with the potential associated complications, an informed witnessed consent was obtained. The risks of intracranial hemorrhage of 10%, worsening neurological deficit, ventilator dependency, death and inability to revascularize were all reviewed in detail with the patient's mother. The patient was then put under general anesthesia by the Department of Anesthesiology at [facility]. The in situ right common femoral vein 8 [place], and in situ left common femoral arterial French sheath were exchanged over a 0.035 inch guidewire for a new sheath which were then connected to continuous heparinized saline infusion. Over a 0.035 inch Roadrunner guidewire, a 5 [place] JB 1 catheter was advanced to the aortic arch region and positioned in the right common carotid artery. Through the right common femoral vein, over a 0.035 inch Roadrunner guidewire, a 5 French catheter was administered to the right internal jugular vein to the skull base. This in turn was then exchanged over a 0.035 inch 300 cm Constance Holster exchange guidewire for an 8 French 80 cm Neuron Max sheath. The guidewire was removed. Good aspiration obtained from the hub of the Neuron Max sheath. A gentle control arteriogram performed through this demonstrates safe position of the tip of the Neuron Max sheath. This was then connected to continuous heparinized saline infusion. FINDINGS: The right common carotid arteriogram demonstrates the right external carotid artery and its major branches to be widely patent. The right internal carotid  artery at the bulb to the cranial skull base is widely patent. The petrous, cavernous and supraclinoid segments are also widely patent. The  right middle cerebral artery and the right anterior cerebral opacify into the capillary and venous phases. The venous phase demonstrates again significantly decreased venous congestion overlying both cerebral convexities. Patency is noted of the right transverse sinus to the right transverse sinus sigmoid sinus junction. This vein of Labbe and the posterior temporal vein are patent draining into venous channel running parallel to the to the occluded right internal jugular vein. Patency is maintained at the torcula, the left transverse sinus, the left sigmoid sinus and the left internal jugular vein. PROCEDURE: An 071 95 cm Benchmark catheter was advanced through the Neuron Max guide sheath to the left internal jugular occluded vein. An 035 inch Roadrunner wire was first advanced without difficulty into the right sigmoid sinus and the distal right transverse sinus. However, advancement of the 6 [place] Benchmark was met with significant resistance. At this time, in a coaxial manner and with constant heparinized saline infusion over a 016 inch Fathom micro guidewire, a Marksman 027 microcatheter was advanced without difficulty to the torcula. The guidewire was removed. Control arteriogram was then performed from the proximal right transverse sinus. This confirmed brisk flow through the right transverse and left transverse sinuses. This also demonstrated the partially occluded on the torcula. The 016 micro guidewire was reintroduced, and the torque device was advanced without difficulty into the distal 1/3 of the superior sagittal sinus and eventually into the junction of the anterior and the middle 1/3. Micro guidewire was removed. Good aspiration was obtained from the hub of the microcatheter. A gentle control arteriogram performed through this demonstrated extensive occlusion of  the anterior 2/3 of the superior sagittal sinus. Through the microcatheter, a 6 mm x 40 mm Solitaire X retrieval device was advanced and deployed through the microcatheter. The microcatheter was then gently retrieved. The 6 [place] Benchmark guide catheter was then advanced to just inside the torcula having the hub at the right groin. At this time as constant aspiration was applied through the Benchmark, the retrieval device and the microcatheter were retrieved and removed. Copious amounts of clot were seen in the canister, and also in the retrieval device. Three further passes were made with a similar arrangement resulting in more clot being removed. A final pass was then made through a 071 136 cm Zoom aspiration catheter which replaced the Benchmark catheter. This could be advanced more distally to the anterior third of the superior sagittal sinus. Constant aspiration was applied as the aspiration catheter was retrieved slowly more proximally and into the right transverse sinus. Again more clot was obtained especially in the canister. A control arteriogram performed through the diagnostic catheter in the right common carotid artery into the venous phase now demonstrated complete recanalization of the superior sagittal sinus, the right transverse sinus, the left transverse sinus, the left sigmoid sinus and the left internal jugular vein. Gentle control injection performed through the Zoom aspiration catheter in the mid right transverse sinus demonstrated significant stenosis of the right transverse sinus sigmoid sinus junction. Also noted was severely contracted right sigmoid sinus to the internal jugular vein. At this time, a 8 mm x 40 mm sterling balloon angioplasty catheter was advanced over a 0.014 inch exchange micro guidewire. Balloon angioplasty was then performed extending from the distal 1/3 of the transverse sinus on the right side, the transverse sinus sigmoid sinus junction, and the sigmoid sinus. At the  end of this, a control arteriogram performed in the venous phase demonstrated marginally improved flow through the right sigmoid sinus into  the now patent right internal jugular vein. However, there continued be prominent collateral just lateral to this at the level of the transverse sinus sigmoid sinus junction draining the posterior temporal vein, and the vein of Labbe. There continued be stenosis at the right transverse sinus sigmoid sinus junction partially improved. As venous grafts continued through superior sagittal sinus and transverse sinuses and the right sigmoid sinus and through a prominent collateral just into the right sigmoid sinus, it was elected to stop. 071 Zoom aspiration catheter, and the Neuron Max sheath were removed with hemostasis achieved at the right groin venous puncture site. The left femoral sheath was then removed and hemostasis achieved with a 5 [place] ExoSeal closure device. Distal pulses remained palpable. A CT of the brain demonstrated no gross subarachnoid hemorrhage or mass effect or midline shift. The patient was left intubated and transferred to the neuro ICU. IMPRESSION: Status post endovascular complete revascularization of entire superior sagittal sinus, the right transverse sinus, the left transverse sinus, the left sigmoid sinus, the left internal jugular vein, and partially the right sigmoid sinus and right internal jugular vein using the aspiration, and stent retrieval technique as described. PLAN: Follow-up in the clinic 4 to 6 weeks post discharge. Electronically Signed   By: Luanne Bras M.D.   On: 02/17/2020 13:36   IR CT Head Ltd  Result Date: 02/18/2020 INDICATION: Stuporous.  Unresponsive.  Occluded dural venous sinuses. EXAM: 1. EMERGENT LARGE VESSEL OCCLUSION THROMBOLYSIS (POSTERIOR CIRCULATION) COMPARISON:  MRI brain February 12, 2020. MRI venogram of February 12, 2020. MEDICATIONS: Ancef 2 g IV antibiotic was administered within 1 hour of the procedure.  ANESTHESIA/SEDATION: General anesthesia CONTRAST:  Isovue 300 approximately 200 mL. FLUOROSCOPY TIME:  Fluoroscopy Time: 138 minutes 24 seconds (3981 mGy). COMPLICATIONS: None immediate. TECHNIQUE: Following a full explanation of the procedure along with the potential associated complications, an informed witnessed consent was obtained from the patient's mother. The risks of intracranial hemorrhage of 10%, worsening neurological deficit, ventilator dependency, death and inability to revascularize were all reviewed in detail with the patient's mother. The patient was then put under general anesthesia by the Department of Anesthesiology at [facility]. The right groin was prepped and draped in the usual sterile fashion. Thereafter using modified Seldinger technique, transfemoral access into the right common femoral vein was obtained without difficulty. Over a 0.035 inch guidewire a 5 French Pinnacle sheath was inserted. Through this, and also over a 0.035 inch guidewire a 5 [place] JB 1 catheter was advanced and positioned in the left internal jugular vein to the cranial skull base. The guidewire was removed. Good aspiration was obtained from hub of the diagnostic catheter. A gentle control arteriogram confirmed safe position of tip of the JB 1 diagnostic catheter. Over a 0.035 inch 300 cm Rosen exchange guidewire, an 8 [place] Neuron Max sheath was then inserted after removal of the 5 [place] sheath. The tip of the Neuron Max guide sheath was positioned in the internal jugular vein at the skull base. This was then connected to continuous heparinized saline infusion. Through the left groin, access into the left common femoral artery obtained over a 0.018 inch micro guidewire and a micropuncture set. Over a 0.035 inch J guidewire, a 5 [place] JB 1 catheter was advanced to the aortic arch region, and selectively positioned in the right common carotid artery, the right vertebral artery and the left common carotid  artery. Finally the tip of the diagnostic catheter was positioned in the proximal left internal  carotid artery and connected to continuous heparinized saline infusion. FINDINGS: The right common carotid arteriogram demonstrates the right external carotid artery and its major branches to be widely patent. The right internal carotid artery at the bulb to the cranial skull base opacifies widely. The petrous, cavernous and supraclinoid segments are widely patent. The right middle cerebral artery and the right anterior cerebral artery opacify into the capillary phase. The delayed venous phase demonstrates engorged cortical veins in the entirety of the right cerebral hemisphere with stagnation of flow in the parasagittal venous systems of the posterior 2/3 of the superior sagittal is noted. No significant opacification is noted in the transverse sinuses or sigmoid sinuses or marginal sinuses. Prominence of the vein of Labbe is seen projecting into a laterally positioned engorged vein running parallel to the occluded internal jugular vein. The right vertebral artery origin is widely patent. Vessel is seen to opacify normally to the cranial skull base. Patency is maintained of the right posterior-inferior cerebral artery and the right vertebrobasilar junction. The posterior cerebral arteries, the superior cerebellar arteries and the anterior-inferior cerebellar arteries opacify into the capillary and venous phases. The venous phase demonstrates opacification of the distal 2/3 of the left transverse sinus, the left sigmoid sinus and the left internal jugular vein. There is no opacification of the torcula or the inferior sagittal sinus, or the right transverse or right sigmoid sinus. The left common carotid arteriogram demonstrates the left external carotid artery and its major branches to be widely patent. The left internal carotid artery at the bulb to the cranial skull base is widely patent. The petrous, cavernous and  supraclinoid segments are functional. The left middle cerebral artery and the left anterior cerebral artery opacify into the capillary phase with delayed exit of contrast in the capillary phase with attenuated caliber of the cortical veins. No opacification is seen of the distal 2/3 of the superior sagittal sinus, the torcula or the right-sided dural venous sinuses. There was no significant opacification seen of the left transverse sinus either. However, there is opacification of vein of the Labbe, and the posterior temporal vein which project into the left transverse sinus sigmoid sinus junction. Patency of the left sigmoid sinus is noted. Lateral to this vein projecting parallel to the internal jugular vein. PROCEDURE: Arterial injection through the left common carotid artery into the venous phase were utilized for venous road maps. Over a 0.035 inch Roadrunner guidewire a 105 cm 071 Benchmark guide catheter was advanced to the distal end of the 8 [place] Neuron Max sheath. The 035 inch Roadrunner guidewire was easily advanced through the left internal jugular bulb, the left sigmoid sinus, the left transverse sinus and into mid right transverse sinus followed by the Benchmark catheter. The guidewire was removed. Resistance to aspiration was noted. Through the [facility] guide sheath, over a 0.014 inch standard Synchro micro guidewire, with a J configuration a Marksman 027 microcatheter was then advanced without difficulty to the right transverse sinus sigmoid sinus junction. Using a torque device the micro guidewire was eventually advanced into the distal right sigmoid sinus at the junction with the internal jugular bulb followed by the microcatheter. The guidewire was removed. Safe position of the tip of the microcatheter was confirmed. A 6 mm x 45 mm Solitaire X retrieval device was then advanced to the distal end of the microcatheter and deployed in the usual manner by unsheathing the microcatheter. Constant  aspiration was then applied at the hub of the 6 [place] guide catheter now  positioned in the distal right transverse sinus as gradually the retrieval device was retrieved into the guide sheath and removed. The guide sheath was also retrieved more proximally into the left sigmoid sinus. Copious amounts of clot were noted in the aspirate, and also on the stent retriever. Multiple passes were then made again as described above with access obtained into the right sigmoid sinus and the right internal jugular vein followed by deployment of the 6 x 45 retriever. This was then followed by slow aspiration at the hub of the Benchmark catheter which continued to retrieve into the left sigmoid sinus. Again 3 or 4 passes were made with copious amounts of clot retrieved into the canister and also in the aspirate, and in the retrieval device. Eventually arterial injection in the venous phase demonstrated significantly improved caliber of the distal right transverse sinus and the left transverse sinus. A control arteriogram performed now demonstrates significant decrease in the stagnation of flow in the cortical veins and the left cerebral hemisphere in general. Vein of Trolard was now noted to be filling though with nonocclusive filling defects consistent with clot. No significant improvement was noted in the superior sagittal sinus. Flow was also noted to be more brisk in the vein of Labbe and the left posterior temporal vein. Control arteriograms also demonstrated flow into the now recanalized right transverse sinus and to the left transverse sinus and egress into the left sigmoid sinus and left internal jugular vein. Despite multiple attempts with the microcatheter and retriever in the right sigmoid sinus, no significant opacification was realized despite removal of clot. Advancement at the right sigmoid sinus transverse sinus junction was met with significant resistance raising the possibly of an underlying severe transverse  sinus sigmoid sinus stenosis. Multiple attempts were also made to access the superior sagittal sinus through the torcula which was met with significant resistance. Multiple attempts were made to access the superior sagittal sinus, and the torcula. Access was obtained into the straight sinus, and with retriever and aspiration clot was retrieved from this site. In view of the amount of contrast utilized, and the restoration of antegrade flow in the right transverse sinus, the left transverse sinus sigmoid sinus, and significantly improved flow through the left cerebral hemisphere, the procedure was stopped. A CT of the brain demonstrated no evidence of intracranial hemorrhages, or mass effect. There appeared to be generalized improvement in the left cerebral hemispheric swelling. 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 On the CT also noted was the posterior cerebral veins, the vein of Galen and the straight sinus. The 8 [place] Neuron Max was removed and replaced with a short 8 [place] Pinnacle sheath which was connected to continuous heparinized saline infusion. The 5 French left common femoral artery sheath was left in-situ, and be readily available in case of a second treatment. Patient was left intubated and returned to the floor in stable neurologic condition. IMPRESSION: Status post endovascular revascularization of occluded right transverse sinus, left transverse sinus and partially the right sigmoid sinus with significant improved flow and decompression of the left cerebral hemispheric venous system. Partial recanalization of the vein of Trolard noted. PLAN: Follow patient clinically for signs of decrease in intracranial pressure, increased improvement and improved sensorium. Probable repeat MRI of the brain and MRA of the brain in a day or two. Continue with IV heparin treatment. Electronically Signed   By: Luanne Bras M.D.   On: 02/17/2020 10:40   DG Chest Port  1  View  Result Date: 02/18/2020 CLINICAL DATA:  Abnormal respirations. EXAM: PORTABLE CHEST 1 VIEW COMPARISON:  02/14/2020. FINDINGS: Interim extubation of NG tube. Right PICC line noted with tip over cavoatrial junction. Borderline cardiomegaly. Diffuse bilateral pulmonary infiltrates/edema. No pleural effusion or pneumothorax. IMPRESSION: 1. Interim extubation and removal of NG tube. Right PICC line noted with tip over cavoatrial junction. 2. Borderline cardiomegaly. 3. Diffuse bilateral pulmonary infiltrates/edema noted on today's exam. Electronically Signed   By: Marcello Moores  Register   On: 02/18/2020 05:36   DG CHEST PORT 1 VIEW  Result Date: 02/14/2020 CLINICAL DATA:  Endotracheal tube placement. EXAM: PORTABLE CHEST 1 VIEW COMPARISON:  One day prior FINDINGS: Endotracheal tube terminates 3.9 cm above carina. Nasogastric tube extends beyond the inferior aspect of the film. Right-sided PICC line is difficult to follow centrally. Most likely with tip at cavoatrial junction. Midline trachea. Normal heart size. No pleural effusion or pneumothorax. Improved left lower lobe aeration with mild bibasilar atelectasis remaining. IMPRESSION: Appropriate position of endotracheal tube. Bibasilar atelectasis with significantly improved left lower lobe aeration. Electronically Signed   By: Abigail Miyamoto M.D.   On: 02/14/2020 13:08   DG Chest Port 1 View  Result Date: 02/13/2020 CLINICAL DATA:  01/19/2014 EXAM: PORTABLE CHEST 1 VIEW COMPARISON:  Respiratory FINDINGS: Later endotracheal tube is seen with its tip just within the right mainstem bronchus. Nasogastric tube tip is seen within the proximal body of the stomach with its proximal side hole at the expected gastroesophageal junction. Retrocardiac opacity likely relates to partial left lower lobe collapse. Right lung is clear. No pneumothorax or pleural effusion. Cardiac size within normal limits. Pulmonary vascularity is normal. IMPRESSION: Right mainstem intubation.  Withdrawal of the endotracheal tube by approximately 2.5 cm may better position the catheter. Partial left lower lobe collapse. Nasogastric tube proximal side hole at the gastroesophageal junction. Advancement by roughly 5 cm may better position the catheter. These results will be called to the ordering clinician or representative by the Radiologist Assistant, and communication documented in the PACS or [employer]. Electronically Signed   By: Fidela Salisbury MD   On: 02/13/2020 23:35   EEG adult  Result Date: 02/16/2020 Lora Havens, MD     02/16/2020 10:46 AM Patient Name: JEANELL MANGAN MRN: [id number] Epilepsy Attending: Lora Havens Referring Physician/Provider: Dr Rosalin Hawking Date: 02/16/2020 Duration: 24.48 mins Patient history: 35 year old female with altered mental status.  EEG to evaluate for seizures. Level of alertness: Comatose AEDs during EEG study: LEV Technical aspects: This EEG study was done with scalp electrodes positioned according to the 10-20 International system of electrode placement. Electrical activity was acquired at a sampling rate of $Remov'500Hz'egNrWz$  and reviewed with a high frequency filter of $RemoveB'70Hz'JIHfmGyQ$  and a low frequency filter of $RemoveB'1Hz'IlQdwmTZ$ . EEG data were recorded continuously and digitally stored. Description: EEG showed continuous polymorphic 3 to 6 Hz theta-delta slowing. Hperventilation and photic stimulation were not performed.   ABNORMALITY -Continuous slow, generalized IMPRESSION: This study is suggestive of moderate to severe diffuse encephalopathy, nonspecific etiology.  No seizures or epileptiform discharges were seen throughout the recording. Lora Havens   EEG adult  Result Date: 02/12/2020 Lora Havens, MD     02/12/2020  4:06 PM Patient Name: LEONETTE TISCHER MRN: [id number] Epilepsy Attending: Lora Havens Referring Physician/Provider: Dr. Kathrynn Speed Date: 02/12/2020 Duration: 25.25 minutes Patient history: 35 year old female with altered mental  status.  EEG to evaluate for seizures. Level of alertness: Awake, asleep AEDs during EEG study:  None Technical aspects: This EEG study was done with scalp electrodes positioned according to the 10-20 International system of electrode placement. Electrical activity was acquired at a sampling rate of $Remov'500Hz'xIqMpU$  and reviewed with a high frequency filter of $RemoveB'70Hz'TkEHIqIZ$  and a low frequency filter of $RemoveB'1Hz'cZegZAmb$ . EEG data were recorded continuously and digitally stored. Description: Np posterior dominant rhythm was seen. Sleep was characterized by sleep spindles (12 to 14 Hz), maximal frontocentral region.  EEG showed continuous generalized 3 to 5 Hz theta and delta slowing.  Hyperventilation and photic stimulation were not performed. At 1531, patient was noted to move her right arm towards her chest followed by slow but rhythmic jerking of right upper extremity.  Concomitant EEG showed high amplitude 2 to 3 Hz rhythmic delta slowing in bitemporal region which gradually increases in amplitude and appears more sharply contoured without definite evolution in frequency. ABNORMALITY -Continue slow, generalized IMPRESSION: This study is suggestive of moderate diffuse encephalopathy, nonspecific etiology.  At 1531, patient was noted to have right upper extremity jerking.  Concomitant EEG showed high amplitude 2 to 3 Hz rhythmic delta slowing in bitemporal region with evolution in morphology and amplitude but without definite evolution in frequency concerning for focal motor seizure. Dr. Leonel Ramsay was notified. Lora Havens   MR MRV HEAD W WO CONTRAST  Result Date: 02/18/2020 CLINICAL DATA:  Stroke, follow-up. EXAM: MRI HEAD WITH CONTRAST MRV HEAD WITHOUT AND WITH CONTRAST TECHNIQUE: Multiplanar, multiecho pulse sequences of the brain and surrounding structures were obtained with intravenous contrast. Angiographic images of the intracranial venous structures were obtained using MRV technique without and with intravenous contrast.  COMPARISON:  Head CT 02/16/2020, report from thrombectomy 02/15/2020, MRI/MRV head 02/15/2020 CONTRAST:  110mL GADAVIST GADOBUTROL 1 MMOL/ML IV SOLN FINDINGS: MR HEAD FINDINGS Brain: The examination is intermittently motion degraded, limiting evaluation. Most notably, there is severe motion degradation of the sagittal T1 weighted sequence and moderate motion degradation of the coronal T2 weighted sequence. The dominant acute/subacute hemorrhagic venous infarct within the left frontoparietal lobes has not significantly changed. Unchanged local mass effect with partial effacement of the left lateral ventricle. Restricted diffusion at site of an acute/early subacute hemorrhagic infarct within the right callosal splenium has subtly increased in extent. Persistent although decreased T2/FLAIR hyperintensity within the bilateral basal ganglia and thalami. Possible new punctate acute infarct within the left thalamus capsular junction (series 4, image 24). Bilateral acute/early subacute deep watershed white matter infarcts otherwise appear similar in distribution and extent. Persistent trace subarachnoid hemorrhage along the left frontoparietal convexity. No midline shift. Vascular: Expected proximal arterial flow voids. Skull and upper cervical spine: Within limitations of motion degradation, no focal marrow lesion is identified. Sinuses/Orbits: Visualized orbits show no acute finding. Fluid within the posterior ethmoid air cells and sphenoid sinuses bilaterally. Right mastoid effusion. MRV HEAD FINDINGS Flow within the superior sagittal sinus has significantly improved since the prior MRV of 02/15/2020. However, there are persistent foci of nonocclusive thrombus within this vessel. Nonocclusive thrombus within the left transverse and proximal sigmoid dural venous sinuses has increased. Persistent nonocclusive thrombus within the distal right transverse sinus. Persistent near occlusive or occlusive thrombus within the right  sigmoid sinus and visualized right internal jugular vein. The degree of flow within the deep venous system is unchanged. The veins of Trolard and a few additional cerebral veins overlying the bilateral frontoparietal convexities remain thrombosed (best appreciated on the axial precontrast T1 weighted sequence). MRV impression #2 will be called to the ordering clinician or representative by  the Psychologist, clinical, and communication documented in the PACS or [employer]. IMPRESSION: MRI brain: 1. Motion degraded exam. 2. 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 3. Restricted diffusion at site of an acute/subacute hemorrhagic infarct within the right callosal splenium has subtly increased in extent. 4. T2/FLAIR hyperintensity within the deep gray nuclei persists but has diminished. However, there may be a new punctate acute infarct within the left thalamocapsular junction. 5. 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 6. Persistent trace subarachnoid hemorrhage along the left frontoparietal convexity. MRV head: 1. Flow within the superior sagittal sinus has significantly improved since the prior MRV of 02/15/2020. However, there are persistent foci of nonocclusive thrombus within this vessel. 2. Nonocclusive thrombus within the left transverse and proximal sigmoid dural venous sinuses has increased. 3. Persistent nonocclusive thrombus within the right transverse sinus. 4. Persistent near occlusive or occlusive thrombus within the right sigmoid sinus and visualized right internal jugular vein. 5. The degree of flow within the deep venous system is unchanged. 6. The veins of Trolard and a few additional cerebral veins overlying the bilateral frontoparietal convexities remain thrombosed. Electronically Signed   By: Kellie Simmering DO   On: 02/18/2020 11:38    ECHOCARDIOGRAM COMPLETE  Result Date: 02/16/2020    ECHOCARDIOGRAM REPORT   Patient Name:   LEANE LORING Date of Exam: 02/16/2020 Medical Rec #:  [id number]          Height:       65.0 in Accession #:    [id number]         Weight:       153.9 lb Date of Birth:  1985/05/11         BSA:          1.770 m Patient Age:    89 years           BP:           99/80 mmHg Patient Gender: F                  HR:           96 bpm. Exam Location:  Inpatient Procedure: 2D Echo Indications:    stroke 434.91  History:        Patient has no prior history of Echocardiogram examinations.  Sonographer:    Johny Chess Referring Phys: [place]  1. Left ventricular ejection fraction, by estimation, is 55 to 60%. The left ventricle has normal function. The left ventricle has no regional wall motion abnormalities. Left ventricular diastolic parameters were normal.  2. Right ventricular systolic function is normal. The right ventricular size is normal. There is normal pulmonary artery systolic pressure.  3. Mild mitral valve leaflet thickening and calcification. Trivial mitral valve regurgitation.  4. The aortic valve is tricuspid. There is mild thickening of the aortic valve. Aortic valve regurgitation is not visualized. No aortic stenosis is present.  5. The inferior vena cava is normal in size with <50% respiratory variability, suggesting right atrial pressure of 8 mmHg. Conclusion(s)/Recommendation(s): No intracardiac source of embolism detected on this transthoracic study. A transesophageal echocardiogram is recommended to exclude cardiac source of embolism if clinically indicated. FINDINGS  Left Ventricle: Left ventricular ejection fraction, by estimation, is 55 to 60%. The left ventricle has normal function. The left ventricle has no regional wall motion abnormalities. The left ventricular internal  cavity size was normal in size. There is  no left ventricular hypertrophy. Left ventricular  diastolic parameters were normal. Right Ventricle: The right ventricular size is normal. Right vetricular wall thickness was not well visualized. Right ventricular systolic function is normal. There is normal pulmonary artery systolic pressure. The tricuspid regurgitant velocity is 2.06 m/s, and with an assumed right atrial pressure of 3 mmHg, the estimated right ventricular systolic pressure is 41.6 mmHg. Left Atrium: Left atrial size was normal in size. Right Atrium: Right atrial size was normal in size. Pericardium: There is no evidence of pericardial effusion. Mitral Valve: The mitral valve is abnormal. There is mild thickening of the mitral valve leaflet(s). There is mild calcification of the mitral valve leaflet(s). Trivial mitral valve regurgitation. Tricuspid Valve: The tricuspid valve is normal in structure. Tricuspid valve regurgitation is trivial. Aortic Valve: The aortic valve is tricuspid. There is mild thickening of the aortic valve. Aortic valve regurgitation is not visualized. No aortic stenosis is present. Pulmonic Valve: The pulmonic valve was normal in structure. Pulmonic valve regurgitation is trivial. Aorta: The aortic root and ascending aorta are structurally normal, with no evidence of dilitation. Venous: The inferior vena cava is normal in size with less than 50% respiratory variability, suggesting right atrial pressure of 8 mmHg. IAS/Shunts: No atrial level shunt detected by color flow Doppler.  LEFT VENTRICLE PLAX 2D LVIDd:         4.40 cm  Diastology LVIDs:         3.20 cm  LV e' medial:    12.70 cm/s LV PW:         1.00 cm  LV E/e' medial:  7.3 LV IVS:        0.80 cm  LV e' lateral:   15.60 cm/s LVOT diam:     1.90 cm  LV E/e' lateral: 6.0 LV SV:         50 LV SV Index:   28 LVOT Area:     2.84 cm  RIGHT VENTRICLE             IVC RV S prime:     15.70 cm/s  IVC diam: 1.90 cm TAPSE (M-mode): 2.3 cm LEFT ATRIUM             Index       RIGHT ATRIUM           Index LA diam:        3.00 cm  1.70 cm/m  RA Area:     11.90 cm LA Vol (A2C):   32.6 ml 18.42 ml/m RA Volume:   27.70 ml  15.65 ml/m LA Vol (A4C):   32.5 ml 18.37 ml/m LA Biplane Vol: 34.1 ml 19.27 ml/m  AORTIC VALVE LVOT Vmax:   97.30 cm/s LVOT Vmean:  73.400 cm/s LVOT VTI:    0.177 m  AORTA Ao Root diam: 2.50 cm Ao Asc diam:  2.30 cm MITRAL VALVE               TRICUSPID VALVE MV Area (PHT): 4.31 cm    TR Peak grad:   17.0 mmHg MV Decel Time: 176 msec    TR Vmax:        206.00 cm/s MV E velocity: 93.00 cm/s MV A velocity: 81.80 cm/s  SHUNTS MV E/A ratio:  1.14        Systemic VTI:  0.18 m  Systemic Diam: 1.90 cm Laurance Flatten MD Electronically signed by Laurance Flatten MD Signature Date/Time: 02/16/2020/2:49:18 PM    Final    [place] EKG SITE RITE  Result Date: 02/13/2020 If Site Rite image not attached, placement could not be confirmed due to current cardiac rhythm.  IR ANGIO INTRA EXTRACRAN SEL COM CAROTID INNOMINATE BILAT MOD SED  Result Date: 02/18/2020 INDICATION: Stuporous.  Unresponsive.  Occluded dural venous sinuses. EXAM: 1. EMERGENT LARGE VESSEL OCCLUSION THROMBOLYSIS (POSTERIOR CIRCULATION) COMPARISON:  MRI brain February 12, 2020. MRI venogram of February 12, 2020. MEDICATIONS: Ancef 2 g IV antibiotic was administered within 1 hour of the procedure. ANESTHESIA/SEDATION: General anesthesia CONTRAST:  Isovue 300 approximately 200 mL. FLUOROSCOPY TIME:  Fluoroscopy Time: 138 minutes 24 seconds (3981 mGy). COMPLICATIONS: None immediate. TECHNIQUE: Following a full explanation of the procedure along with the potential associated complications, an informed witnessed consent was obtained from the patient's mother. The risks of intracranial hemorrhage of 10%, worsening neurological deficit, ventilator dependency, death and inability to revascularize were all reviewed in detail with the patient's mother. The patient was then put under general anesthesia by the Department of Anesthesiology at [facility]. The right groin was prepped and draped in the usual sterile fashion. Thereafter using modified Seldinger technique, transfemoral access into the right common femoral vein was obtained without difficulty. Over a 0.035 inch guidewire a 5 French Pinnacle sheath was inserted. Through this, and also over a 0.035 inch guidewire a 5 [place] JB 1 catheter was advanced and positioned in the left internal jugular vein to the cranial skull base. The guidewire was removed. Good aspiration was obtained from hub of the diagnostic catheter. A gentle control arteriogram confirmed safe position of tip of the JB 1 diagnostic catheter. Over a 0.035 inch 300 cm Rosen exchange guidewire, an 8 [place] Neuron Max sheath was then inserted after removal of the 5 [place] sheath. The tip of the Neuron Max guide sheath was positioned in the internal jugular vein at the skull base. This was then connected to continuous heparinized saline infusion. Through the left groin, access into the left common femoral artery obtained over a 0.018 inch micro guidewire and a micropuncture set. Over a 0.035 inch J guidewire, a 5 [place] JB 1 catheter was advanced to the aortic arch region, and selectively positioned in the right common carotid artery, the right vertebral artery and the left common carotid artery. Finally the tip of the diagnostic catheter was positioned in the proximal left internal carotid artery and connected to continuous heparinized saline infusion. FINDINGS: The right common carotid arteriogram demonstrates the right external carotid artery and its major branches to be widely patent. The right internal carotid artery at the bulb to the cranial skull base opacifies widely. The petrous, cavernous and supraclinoid segments are widely patent. The right middle cerebral artery and the right anterior cerebral artery opacify into the capillary phase. The delayed venous phase demonstrates engorged cortical veins in the entirety of the right  cerebral hemisphere with stagnation of flow in the parasagittal venous systems of the posterior 2/3 of the superior sagittal is noted. No significant opacification is noted in the transverse sinuses or sigmoid sinuses or marginal sinuses. Prominence of the vein of Labbe is seen projecting into a laterally positioned engorged vein running parallel to the occluded internal jugular vein. The right vertebral artery origin is widely patent. Vessel is seen to opacify normally to the cranial skull base. Patency is maintained of the right posterior-inferior cerebral artery and  the right vertebrobasilar junction. The posterior cerebral arteries, the superior cerebellar arteries and the anterior-inferior cerebellar arteries opacify into the capillary and venous phases. The venous phase demonstrates opacification of the distal 2/3 of the left transverse sinus, the left sigmoid sinus and the left internal jugular vein. There is no opacification of the torcula or the inferior sagittal sinus, or the right transverse or right sigmoid sinus. The left common carotid arteriogram demonstrates the left external carotid artery and its major branches to be widely patent. The left internal carotid artery at the bulb to the cranial skull base is widely patent. The petrous, cavernous and supraclinoid segments are functional. The left middle cerebral artery and the left anterior cerebral artery opacify into the capillary phase with delayed exit of contrast in the capillary phase with attenuated caliber of the cortical veins. No opacification is seen of the distal 2/3 of the superior sagittal sinus, the torcula or the right-sided dural venous sinuses. There was no significant opacification seen of the left transverse sinus either. However, there is opacification of vein of the Labbe, and the posterior temporal vein which project into the left transverse sinus sigmoid sinus junction. Patency of the left sigmoid sinus is noted. Lateral to this  vein projecting parallel to the internal jugular vein. PROCEDURE: Arterial injection through the left common carotid artery into the venous phase were utilized for venous road maps. Over a 0.035 inch Roadrunner guidewire a 105 cm 071 Benchmark guide catheter was advanced to the distal end of the 8 [place] Neuron Max sheath. The 035 inch Roadrunner guidewire was easily advanced through the left internal jugular bulb, the left sigmoid sinus, the left transverse sinus and into mid right transverse sinus followed by the Benchmark catheter. The guidewire was removed. Resistance to aspiration was noted. Through the [facility] guide sheath, over a 0.014 inch standard Synchro micro guidewire, with a J configuration a Marksman 027 microcatheter was then advanced without difficulty to the right transverse sinus sigmoid sinus junction. Using a torque device the micro guidewire was eventually advanced into the distal right sigmoid sinus at the junction with the internal jugular bulb followed by the microcatheter. The guidewire was removed. Safe position of the tip of the microcatheter was confirmed. A 6 mm x 45 mm Solitaire X retrieval device was then advanced to the distal end of the microcatheter and deployed in the usual manner by unsheathing the microcatheter. Constant aspiration was then applied at the hub of the 6 [place] guide catheter now positioned in the distal right transverse sinus as gradually the retrieval device was retrieved into the guide sheath and removed. The guide sheath was also retrieved more proximally into the left sigmoid sinus. Copious amounts of clot were noted in the aspirate, and also on the stent retriever. Multiple passes were then made again as described above with access obtained into the right sigmoid sinus and the right internal jugular vein followed by deployment of the 6 x 45 retriever. This was then followed by slow aspiration at the hub of the Benchmark catheter which continued to retrieve  into the left sigmoid sinus. Again 3 or 4 passes were made with copious amounts of clot retrieved into the canister and also in the aspirate, and in the retrieval device. Eventually arterial injection in the venous phase demonstrated significantly improved caliber of the distal right transverse sinus and the left transverse sinus. A control arteriogram performed now demonstrates significant decrease in the stagnation of flow in the cortical veins and the left  cerebral hemisphere in general. Vein of Trolard was now noted to be filling though with nonocclusive filling defects consistent with clot. No significant improvement was noted in the superior sagittal sinus. Flow was also noted to be more brisk in the vein of Labbe and the left posterior temporal vein. Control arteriograms also demonstrated flow into the now recanalized right transverse sinus and to the left transverse sinus and egress into the left sigmoid sinus and left internal jugular vein. Despite multiple attempts with the microcatheter and retriever in the right sigmoid sinus, no significant opacification was realized despite removal of clot. Advancement at the right sigmoid sinus transverse sinus junction was met with significant resistance raising the possibly of an underlying severe transverse sinus sigmoid sinus stenosis. Multiple attempts were also made to access the superior sagittal sinus through the torcula which was met with significant resistance. Multiple attempts were made to access the superior sagittal sinus, and the torcula. Access was obtained into the straight sinus, and with retriever and aspiration clot was retrieved from this site. In view of the amount of contrast utilized, and the restoration of antegrade flow in the right transverse sinus, the left transverse sinus sigmoid sinus, and significantly improved flow through the left cerebral hemisphere, the procedure was stopped. A CT of the brain demonstrated no evidence of  intracranial hemorrhages, or mass effect. There appeared to be generalized improvement in the left cerebral hemispheric swelling. 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 On the CT also noted was the posterior cerebral veins, the vein of Galen and the straight sinus. The 8 [place] Neuron Max was removed and replaced with a short 8 [place] Pinnacle sheath which was connected to continuous heparinized saline infusion. The 5 French left common femoral artery sheath was left in-situ, and be readily available in case of a second treatment. Patient was left intubated and returned to the floor in stable neurologic condition. IMPRESSION: Status post endovascular revascularization of occluded right transverse sinus, left transverse sinus and partially the right sigmoid sinus with significant improved flow and decompression of the left cerebral hemispheric venous system. Partial recanalization of the vein of Trolard noted. PLAN: Follow patient clinically for signs of decrease in intracranial pressure, increased improvement and improved sensorium. Probable repeat MRI of the brain and MRA of the brain in a day or two. Continue with IV heparin treatment. Electronically Signed   By: Luanne Bras M.D.   On: 02/17/2020 10:40   IR ANGIO INTRA EXTRACRAN SEL INTERNAL CAROTID UNI L MOD SED  Result Date: 02/18/2020 INDICATION: Stuporous.  Unresponsive.  Occluded dural venous sinuses. EXAM: 1. EMERGENT LARGE VESSEL OCCLUSION THROMBOLYSIS (POSTERIOR CIRCULATION) COMPARISON:  MRI brain February 12, 2020. MRI venogram of February 12, 2020. MEDICATIONS: Ancef 2 g IV antibiotic was administered within 1 hour of the procedure. ANESTHESIA/SEDATION: General anesthesia CONTRAST:  Isovue 300 approximately 200 mL. FLUOROSCOPY TIME:  Fluoroscopy Time: 138 minutes 24 seconds (3981 mGy). COMPLICATIONS: None immediate. TECHNIQUE: Following a full explanation of the procedure along with the potential  associated complications, an informed witnessed consent was obtained from the patient's mother. The risks of intracranial hemorrhage of 10%, worsening neurological deficit, ventilator dependency, death and inability to revascularize were all reviewed in detail with the patient's mother. The patient was then put under general anesthesia by the Department of Anesthesiology at [facility]. The right groin was prepped and draped in the usual sterile fashion. Thereafter using modified Seldinger technique, transfemoral access into the right common femoral  vein was obtained without difficulty. Over a 0.035 inch guidewire a 5 French Pinnacle sheath was inserted. Through this, and also over a 0.035 inch guidewire a 5 [place] JB 1 catheter was advanced and positioned in the left internal jugular vein to the cranial skull base. The guidewire was removed. Good aspiration was obtained from hub of the diagnostic catheter. A gentle control arteriogram confirmed safe position of tip of the JB 1 diagnostic catheter. Over a 0.035 inch 300 cm Rosen exchange guidewire, an 8 [place] Neuron Max sheath was then inserted after removal of the 5 [place] sheath. The tip of the Neuron Max guide sheath was positioned in the internal jugular vein at the skull base. This was then connected to continuous heparinized saline infusion. Through the left groin, access into the left common femoral artery obtained over a 0.018 inch micro guidewire and a micropuncture set. Over a 0.035 inch J guidewire, a 5 [place] JB 1 catheter was advanced to the aortic arch region, and selectively positioned in the right common carotid artery, the right vertebral artery and the left common carotid artery. Finally the tip of the diagnostic catheter was positioned in the proximal left internal carotid artery and connected to continuous heparinized saline infusion. FINDINGS: The right common carotid arteriogram demonstrates the right external carotid artery and its  major branches to be widely patent. The right internal carotid artery at the bulb to the cranial skull base opacifies widely. The petrous, cavernous and supraclinoid segments are widely patent. The right middle cerebral artery and the right anterior cerebral artery opacify into the capillary phase. The delayed venous phase demonstrates engorged cortical veins in the entirety of the right cerebral hemisphere with stagnation of flow in the parasagittal venous systems of the posterior 2/3 of the superior sagittal is noted. No significant opacification is noted in the transverse sinuses or sigmoid sinuses or marginal sinuses. Prominence of the vein of Labbe is seen projecting into a laterally positioned engorged vein running parallel to the occluded internal jugular vein. The right vertebral artery origin is widely patent. Vessel is seen to opacify normally to the cranial skull base. Patency is maintained of the right posterior-inferior cerebral artery and the right vertebrobasilar junction. The posterior cerebral arteries, the superior cerebellar arteries and the anterior-inferior cerebellar arteries opacify into the capillary and venous phases. The venous phase demonstrates opacification of the distal 2/3 of the left transverse sinus, the left sigmoid sinus and the left internal jugular vein. There is no opacification of the torcula or the inferior sagittal sinus, or the right transverse or right sigmoid sinus. The left common carotid arteriogram demonstrates the left external carotid artery and its major branches to be widely patent. The left internal carotid artery at the bulb to the cranial skull base is widely patent. The petrous, cavernous and supraclinoid segments are functional. The left middle cerebral artery and the left anterior cerebral artery opacify into the capillary phase with delayed exit of contrast in the capillary phase with attenuated caliber of the cortical veins. No opacification is seen of the  distal 2/3 of the superior sagittal sinus, the torcula or the right-sided dural venous sinuses. There was no significant opacification seen of the left transverse sinus either. However, there is opacification of vein of the Labbe, and the posterior temporal vein which project into the left transverse sinus sigmoid sinus junction. Patency of the left sigmoid sinus is noted. Lateral to this vein projecting parallel to the internal jugular vein. PROCEDURE: Arterial injection through the  left common carotid artery into the venous phase were utilized for venous road maps. Over a 0.035 inch Roadrunner guidewire a 105 cm 071 Benchmark guide catheter was advanced to the distal end of the 8 [place] Neuron Max sheath. The 035 inch Roadrunner guidewire was easily advanced through the left internal jugular bulb, the left sigmoid sinus, the left transverse sinus and into mid right transverse sinus followed by the Benchmark catheter. The guidewire was removed. Resistance to aspiration was noted. Through the [facility] guide sheath, over a 0.014 inch standard Synchro micro guidewire, with a J configuration a Marksman 027 microcatheter was then advanced without difficulty to the right transverse sinus sigmoid sinus junction. Using a torque device the micro guidewire was eventually advanced into the distal right sigmoid sinus at the junction with the internal jugular bulb followed by the microcatheter. The guidewire was removed. Safe position of the tip of the microcatheter was confirmed. A 6 mm x 45 mm Solitaire X retrieval device was then advanced to the distal end of the microcatheter and deployed in the usual manner by unsheathing the microcatheter. Constant aspiration was then applied at the hub of the 6 [place] guide catheter now positioned in the distal right transverse sinus as gradually the retrieval device was retrieved into the guide sheath and removed. The guide sheath was also retrieved more proximally into the left  sigmoid sinus. Copious amounts of clot were noted in the aspirate, and also on the stent retriever. Multiple passes were then made again as described above with access obtained into the right sigmoid sinus and the right internal jugular vein followed by deployment of the 6 x 45 retriever. This was then followed by slow aspiration at the hub of the Benchmark catheter which continued to retrieve into the left sigmoid sinus. Again 3 or 4 passes were made with copious amounts of clot retrieved into the canister and also in the aspirate, and in the retrieval device. Eventually arterial injection in the venous phase demonstrated significantly improved caliber of the distal right transverse sinus and the left transverse sinus. A control arteriogram performed now demonstrates significant decrease in the stagnation of flow in the cortical veins and the left cerebral hemisphere in general. Vein of Trolard was now noted to be filling though with nonocclusive filling defects consistent with clot. No significant improvement was noted in the superior sagittal sinus. Flow was also noted to be more brisk in the vein of Labbe and the left posterior temporal vein. Control arteriograms also demonstrated flow into the now recanalized right transverse sinus and to the left transverse sinus and egress into the left sigmoid sinus and left internal jugular vein. Despite multiple attempts with the microcatheter and retriever in the right sigmoid sinus, no significant opacification was realized despite removal of clot. Advancement at the right sigmoid sinus transverse sinus junction was met with significant resistance raising the possibly of an underlying severe transverse sinus sigmoid sinus stenosis. Multiple attempts were also made to access the superior sagittal sinus through the torcula which was met with significant resistance. Multiple attempts were made to access the superior sagittal sinus, and the torcula. Access was obtained into  the straight sinus, and with retriever and aspiration clot was retrieved from this site. In view of the amount of contrast utilized, and the restoration of antegrade flow in the right transverse sinus, the left transverse sinus sigmoid sinus, and significantly improved flow through the left cerebral hemisphere, the procedure was stopped. A CT of the brain demonstrated no  evidence of intracranial hemorrhages, or mass effect. There appeared to be generalized improvement in the left cerebral hemispheric swelling. 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 On the CT also noted was the posterior cerebral veins, the vein of Galen and the straight sinus. The 8 [place] Neuron Max was removed and replaced with a short 8 [place] Pinnacle sheath which was connected to continuous heparinized saline infusion. The 5 French left common femoral artery sheath was left in-situ, and be readily available in case of a second treatment. Patient was left intubated and returned to the floor in stable neurologic condition. IMPRESSION: Status post endovascular revascularization of occluded right transverse sinus, left transverse sinus and partially the right sigmoid sinus with significant improved flow and decompression of the left cerebral hemispheric venous system. Partial recanalization of the vein of Trolard noted. PLAN: Follow patient clinically for signs of decrease in intracranial pressure, increased improvement and improved sensorium. Probable repeat MRI of the brain and MRA of the brain in a day or two. Continue with IV heparin treatment. Electronically Signed   By: Luanne Bras M.D.   On: 02/17/2020 10:40   IR ANGIO INTRA EXTRACRAN SEL INTERNAL CAROTID UNI R MOD SED  Result Date: 02/18/2020 INDICATION: Stuporous.  Dural venous sinus thrombosis. EXAM: 1. EMERGENT LARGE VESSEL OCCLUSION THROMBOLYSIS (POSTERIOR CIRCULATION) COMPARISON:  MRI MRV of February 15, 2020. MEDICATIONS: Ancef 2 g IV  antibiotic was administered within 1 hour of the procedure. ANESTHESIA/SEDATION: General anesthesia CONTRAST:  Isovue 300 100 mL FLUOROSCOPY TIME:  Fluoroscopy Time: 96 minutes 12 seconds (3924 mGy). COMPLICATIONS: None immediate. TECHNIQUE: Following a full explanation of the procedure along with the potential associated complications, an informed witnessed consent was obtained. The risks of intracranial hemorrhage of 10%, worsening neurological deficit, ventilator dependency, death and inability to revascularize were all reviewed in detail with the patient's mother. The patient was then put under general anesthesia by the Department of Anesthesiology at [facility]. The in situ right common femoral vein 8 [place], and in situ left common femoral arterial French sheath were exchanged over a 0.035 inch guidewire for a new sheath which were then connected to continuous heparinized saline infusion. Over a 0.035 inch Roadrunner guidewire, a 5 [place] JB 1 catheter was advanced to the aortic arch region and positioned in the right common carotid artery. Through the right common femoral vein, over a 0.035 inch Roadrunner guidewire, a 5 French catheter was administered to the right internal jugular vein to the skull base. This in turn was then exchanged over a 0.035 inch 300 cm Constance Holster exchange guidewire for an 8 French 80 cm Neuron Max sheath. The guidewire was removed. Good aspiration obtained from the hub of the Neuron Max sheath. A gentle control arteriogram performed through this demonstrates safe position of the tip of the Neuron Max sheath. This was then connected to continuous heparinized saline infusion. FINDINGS: The right common carotid arteriogram demonstrates the right external carotid artery and its major branches to be widely patent. The right internal carotid artery at the bulb to the cranial skull base is widely patent. The petrous, cavernous and supraclinoid segments are also widely patent. The right  middle cerebral artery and the right anterior cerebral opacify into the capillary and venous phases. The venous phase demonstrates again significantly decreased venous congestion overlying both cerebral convexities. Patency is noted of the right transverse sinus to the right transverse sinus sigmoid sinus junction. This vein of Labbe and the posterior temporal vein  are patent draining into venous channel running parallel to the to the occluded right internal jugular vein. Patency is maintained at the torcula, the left transverse sinus, the left sigmoid sinus and the left internal jugular vein. PROCEDURE: An 071 95 cm Benchmark catheter was advanced through the Neuron Max guide sheath to the left internal jugular occluded vein. An 035 inch Roadrunner wire was first advanced without difficulty into the right sigmoid sinus and the distal right transverse sinus. However, advancement of the 6 [place] Benchmark was met with significant resistance. At this time, in a coaxial manner and with constant heparinized saline infusion over a 016 inch Fathom micro guidewire, a Marksman 027 microcatheter was advanced without difficulty to the torcula. The guidewire was removed. Control arteriogram was then performed from the proximal right transverse sinus. This confirmed brisk flow through the right transverse and left transverse sinuses. This also demonstrated the partially occluded on the torcula. The 016 micro guidewire was reintroduced, and the torque device was advanced without difficulty into the distal 1/3 of the superior sagittal sinus and eventually into the junction of the anterior and the middle 1/3. Micro guidewire was removed. Good aspiration was obtained from the hub of the microcatheter. A gentle control arteriogram performed through this demonstrated extensive occlusion of the anterior 2/3 of the superior sagittal sinus. Through the microcatheter, a 6 mm x 40 mm Solitaire X retrieval device was advanced and deployed  through the microcatheter. The microcatheter was then gently retrieved. The 6 [place] Benchmark guide catheter was then advanced to just inside the torcula having the hub at the right groin. At this time as constant aspiration was applied through the Benchmark, the retrieval device and the microcatheter were retrieved and removed. Copious amounts of clot were seen in the canister, and also in the retrieval device. Three further passes were made with a similar arrangement resulting in more clot being removed. A final pass was then made through a 071 136 cm Zoom aspiration catheter which replaced the Benchmark catheter. This could be advanced more distally to the anterior third of the superior sagittal sinus. Constant aspiration was applied as the aspiration catheter was retrieved slowly more proximally and into the right transverse sinus. Again more clot was obtained especially in the canister. A control arteriogram performed through the diagnostic catheter in the right common carotid artery into the venous phase now demonstrated complete recanalization of the superior sagittal sinus, the right transverse sinus, the left transverse sinus, the left sigmoid sinus and the left internal jugular vein. Gentle control injection performed through the Zoom aspiration catheter in the mid right transverse sinus demonstrated significant stenosis of the right transverse sinus sigmoid sinus junction. Also noted was severely contracted right sigmoid sinus to the internal jugular vein. At this time, a 8 mm x 40 mm sterling balloon angioplasty catheter was advanced over a 0.014 inch exchange micro guidewire. Balloon angioplasty was then performed extending from the distal 1/3 of the transverse sinus on the right side, the transverse sinus sigmoid sinus junction, and the sigmoid sinus. At the end of this, a control arteriogram performed in the venous phase demonstrated marginally improved flow through the right sigmoid sinus into the  now patent right internal jugular vein. However, there continued be prominent collateral just lateral to this at the level of the transverse sinus sigmoid sinus junction draining the posterior temporal vein, and the vein of Labbe. There continued be stenosis at the right transverse sinus sigmoid sinus junction partially improved. As venous grafts  continued through superior sagittal sinus and transverse sinuses and the right sigmoid sinus and through a prominent collateral just into the right sigmoid sinus, it was elected to stop. 071 Zoom aspiration catheter, and the Neuron Max sheath were removed with hemostasis achieved at the right groin venous puncture site. The left femoral sheath was then removed and hemostasis achieved with a 5 [place] ExoSeal closure device. Distal pulses remained palpable. A CT of the brain demonstrated no gross subarachnoid hemorrhage or mass effect or midline shift. The patient was left intubated and transferred to the neuro ICU. IMPRESSION: Status post endovascular complete revascularization of entire superior sagittal sinus, the right transverse sinus, the left transverse sinus, the left sigmoid sinus, the left internal jugular vein, and partially the right sigmoid sinus and right internal jugular vein using the aspiration, and stent retrieval technique as described. PLAN: Follow-up in the clinic 4 to 6 weeks post discharge. Electronically Signed   By: Luanne Bras M.D.   On: 02/17/2020 13:36   IR ANGIO VERTEBRAL SEL VERTEBRAL BILAT MOD SED  Result Date: 02/18/2020 INDICATION: Stuporous.  Unresponsive.  Occluded dural venous sinuses. EXAM: 1. EMERGENT LARGE VESSEL OCCLUSION THROMBOLYSIS (POSTERIOR CIRCULATION) COMPARISON:  MRI brain February 12, 2020. MRI venogram of February 12, 2020. MEDICATIONS: Ancef 2 g IV antibiotic was administered within 1 hour of the procedure. ANESTHESIA/SEDATION: General anesthesia CONTRAST:  Isovue 300 approximately 200 mL. FLUOROSCOPY TIME:   Fluoroscopy Time: 138 minutes 24 seconds (3981 mGy). COMPLICATIONS: None immediate. TECHNIQUE: Following a full explanation of the procedure along with the potential associated complications, an informed witnessed consent was obtained from the patient's mother. The risks of intracranial hemorrhage of 10%, worsening neurological deficit, ventilator dependency, death and inability to revascularize were all reviewed in detail with the patient's mother. The patient was then put under general anesthesia by the Department of Anesthesiology at [facility]. The right groin was prepped and draped in the usual sterile fashion. Thereafter using modified Seldinger technique, transfemoral access into the right common femoral vein was obtained without difficulty. Over a 0.035 inch guidewire a 5 French Pinnacle sheath was inserted. Through this, and also over a 0.035 inch guidewire a 5 [place] JB 1 catheter was advanced and positioned in the left internal jugular vein to the cranial skull base. The guidewire was removed. Good aspiration was obtained from hub of the diagnostic catheter. A gentle control arteriogram confirmed safe position of tip of the JB 1 diagnostic catheter. Over a 0.035 inch 300 cm Rosen exchange guidewire, an 8 [place] Neuron Max sheath was then inserted after removal of the 5 [place] sheath. The tip of the Neuron Max guide sheath was positioned in the internal jugular vein at the skull base. This was then connected to continuous heparinized saline infusion. Through the left groin, access into the left common femoral artery obtained over a 0.018 inch micro guidewire and a micropuncture set. Over a 0.035 inch J guidewire, a 5 [place] JB 1 catheter was advanced to the aortic arch region, and selectively positioned in the right common carotid artery, the right vertebral artery and the left common carotid artery. Finally the tip of the diagnostic catheter was positioned in the proximal left internal carotid  artery and connected to continuous heparinized saline infusion. FINDINGS: The right common carotid arteriogram demonstrates the right external carotid artery and its major branches to be widely patent. The right internal carotid artery at the bulb to the cranial skull base opacifies widely. The petrous, cavernous and supraclinoid segments  are widely patent. The right middle cerebral artery and the right anterior cerebral artery opacify into the capillary phase. The delayed venous phase demonstrates engorged cortical veins in the entirety of the right cerebral hemisphere with stagnation of flow in the parasagittal venous systems of the posterior 2/3 of the superior sagittal is noted. No significant opacification is noted in the transverse sinuses or sigmoid sinuses or marginal sinuses. Prominence of the vein of Labbe is seen projecting into a laterally positioned engorged vein running parallel to the occluded internal jugular vein. The right vertebral artery origin is widely patent. Vessel is seen to opacify normally to the cranial skull base. Patency is maintained of the right posterior-inferior cerebral artery and the right vertebrobasilar junction. The posterior cerebral arteries, the superior cerebellar arteries and the anterior-inferior cerebellar arteries opacify into the capillary and venous phases. The venous phase demonstrates opacification of the distal 2/3 of the left transverse sinus, the left sigmoid sinus and the left internal jugular vein. There is no opacification of the torcula or the inferior sagittal sinus, or the right transverse or right sigmoid sinus. The left common carotid arteriogram demonstrates the left external carotid artery and its major branches to be widely patent. The left internal carotid artery at the bulb to the cranial skull base is widely patent. The petrous, cavernous and supraclinoid segments are functional. The left middle cerebral artery and the left anterior cerebral artery  opacify into the capillary phase with delayed exit of contrast in the capillary phase with attenuated caliber of the cortical veins. No opacification is seen of the distal 2/3 of the superior sagittal sinus, the torcula or the right-sided dural venous sinuses. There was no significant opacification seen of the left transverse sinus either. However, there is opacification of vein of the Labbe, and the posterior temporal vein which project into the left transverse sinus sigmoid sinus junction. Patency of the left sigmoid sinus is noted. Lateral to this vein projecting parallel to the internal jugular vein. PROCEDURE: Arterial injection through the left common carotid artery into the venous phase were utilized for venous road maps. Over a 0.035 inch Roadrunner guidewire a 105 cm 071 Benchmark guide catheter was advanced to the distal end of the 8 [place] Neuron Max sheath. The 035 inch Roadrunner guidewire was easily advanced through the left internal jugular bulb, the left sigmoid sinus, the left transverse sinus and into mid right transverse sinus followed by the Benchmark catheter. The guidewire was removed. Resistance to aspiration was noted. Through the [facility] guide sheath, over a 0.014 inch standard Synchro micro guidewire, with a J configuration a Marksman 027 microcatheter was then advanced without difficulty to the right transverse sinus sigmoid sinus junction. Using a torque device the micro guidewire was eventually advanced into the distal right sigmoid sinus at the junction with the internal jugular bulb followed by the microcatheter. The guidewire was removed. Safe position of the tip of the microcatheter was confirmed. A 6 mm x 45 mm Solitaire X retrieval device was then advanced to the distal end of the microcatheter and deployed in the usual manner by unsheathing the microcatheter. Constant aspiration was then applied at the hub of the 6 [place] guide catheter now positioned in the distal right  transverse sinus as gradually the retrieval device was retrieved into the guide sheath and removed. The guide sheath was also retrieved more proximally into the left sigmoid sinus. Copious amounts of clot were noted in the aspirate, and also on the stent retriever. Multiple passes  were then made again as described above with access obtained into the right sigmoid sinus and the right internal jugular vein followed by deployment of the 6 x 45 retriever. This was then followed by slow aspiration at the hub of the Benchmark catheter which continued to retrieve into the left sigmoid sinus. Again 3 or 4 passes were made with copious amounts of clot retrieved into the canister and also in the aspirate, and in the retrieval device. Eventually arterial injection in the venous phase demonstrated significantly improved caliber of the distal right transverse sinus and the left transverse sinus. A control arteriogram performed now demonstrates significant decrease in the stagnation of flow in the cortical veins and the left cerebral hemisphere in general. Vein of Trolard was now noted to be filling though with nonocclusive filling defects consistent with clot. No significant improvement was noted in the superior sagittal sinus. Flow was also noted to be more brisk in the vein of Labbe and the left posterior temporal vein. Control arteriograms also demonstrated flow into the now recanalized right transverse sinus and to the left transverse sinus and egress into the left sigmoid sinus and left internal jugular vein. Despite multiple attempts with the microcatheter and retriever in the right sigmoid sinus, no significant opacification was realized despite removal of clot. Advancement at the right sigmoid sinus transverse sinus junction was met with significant resistance raising the possibly of an underlying severe transverse sinus sigmoid sinus stenosis. Multiple attempts were also made to access the superior sagittal sinus  through the torcula which was met with significant resistance. Multiple attempts were made to access the superior sagittal sinus, and the torcula. Access was obtained into the straight sinus, and with retriever and aspiration clot was retrieved from this site. In view of the amount of contrast utilized, and the restoration of antegrade flow in the right transverse sinus, the left transverse sinus sigmoid sinus, and significantly improved flow through the left cerebral hemisphere, the procedure was stopped. A CT of the brain demonstrated no evidence of intracranial hemorrhages, or mass effect. There appeared to be generalized improvement in the left cerebral hemispheric swelling. 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 On the CT also noted was the posterior cerebral veins, the vein of Galen and the straight sinus. The 8 [place] Neuron Max was removed and replaced with a short 8 [place] Pinnacle sheath which was connected to continuous heparinized saline infusion. The 5 French left common femoral artery sheath was left in-situ, and be readily available in case of a second treatment. Patient was left intubated and returned to the floor in stable neurologic condition. IMPRESSION: Status post endovascular revascularization of occluded right transverse sinus, left transverse sinus and partially the right sigmoid sinus with significant improved flow and decompression of the left cerebral hemispheric venous system. Partial recanalization of the vein of Trolard noted. PLAN: Follow patient clinically for signs of decrease in intracranial pressure, increased improvement and improved sensorium. Probable repeat MRI of the brain and MRA of the brain in a day or two. Continue with IV heparin treatment. Electronically Signed   By: Luanne Bras M.D.   On: 02/17/2020 10:40       HISTORY OF PRESENT ILLNESS Ms. Benecke is a 35 year old female who presented to the ED with AMS following one  week of GI symptoms. Imaging obtained in the ED revealed a small volume SAH and an extensive, acute dural venous sinus thrombosis; etiology possible thrombus secondary to dehydration from recent GI  illness. Young female without significant identifiable risk factors for thrombosis, need to rule out hypercoagulable condition as etiology of thrombosis. Sickle cell trait without sickle cell disease. Subarachnoid hemorrhage likely sequelae from extensive dural venous thrombosis.   HOSPITAL COURSE Ms. VIENO TARRANT is a 35 y.o. female with PMHx of asthma and sickle cell trait presented with altered mental status.  CT head revealed a small volume subarachnoid hemorrhage and hyperdense dural sinuses with filling defect concerning for dural venous thrombosis.  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  Extremely delicate situation, that is why, IR is consulted for thrombectomy and patient is with IR now.  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    MRI  and MRV 02/12/20 A). Extensive, acute dural venous sinus thrombosis involving the superior sagittal sinus into the right internal jugular vein via the dominant right transverse and sigmoid system. B).Both veins of Trolard are thrombosed and there is straight sinus/deep cerebral thrombosis as well. C). Deep watershed white matter infarcts, small volume subarachnoid hemorrhage, and left more than right frontoparietal cortical swelling due to #1.  IR Thrombectomy: 02/13/2020- Large chunks of clot removed with revascularization of the TS bilaterally and partially the  RT SS and the torcula.Sig decreased venous congestion of the cortical veins noted post procedure.  MRI and MRV 02/15/20 - Improved flow especially in the deep venous system. Improved flow also seen in the proximal right transverse sinus. Occlusive clot still seen in the superior sagittal, distal right transverse, and right sigmoid dural sinuses. No recanalization of the veins of Trolard. Progressive left perirolandic venous infarction and petechial hemorrhage. New edema in the deep gray nuclei without infarction.  IR Thrombectomy: 02/15/2020 - complete revascularization of SSS,Straight S,V of Galen and ICVs ,and RT TS. Sig focal stenosis at the RT TS/SS junction angioplastied with balloon with improved flow into RT SS.  MRI and MRV 02/18/20: acute/subacute left frontoparietal and right CC hemorrhagic venous infarct, b/l deep watershed white matter infarcts appear similar to the prior MRI. Flow within the SSS has significantly improved since the prior MRV of 02/15/2020. However, there are persistent foci of occlusive or nonocclusive thrombus within SSS, b/l TS, b/l SS, and right IJ. Degree of flow within deep venous system unchanged, veins of Trolard remain to be thrombosed  2D Echo - EF is 55 to 60%.  LDL 108  HgbA1c 4.9   Hypercoagulable panel: mildly decreased protein S, but may be an acute phase reactant and should be rechecked as an outpatient in 6 to 8 weeks.  Repeat COVID testing: negative on 02/14/20 and on 02/11/20  VTE prophylaxis - IV heparin  No antithrombotic prior to admission, was on IV heparin now changed to Pradaxa 150 mg twice daily  On amantadine to facilitate arousal  Therapy recommendations: CIR  Disposition: Pending   DISCHARGE EXAM Blood pressure (!) 130/91, pulse 97, temperature 98.2 F (36.8 C), temperature source Oral, resp. rate 19, height $RemoveBe'5\' 5"'eJdXGFMgw$  (1.651 m), weight 66 kg, last menstrual period 02/03/2020, SpO2 92 %. ***  Discharge Diet      Diet   DIET - DYS 1  Room service  appropriate? No; Fluid consistency: Nectar Thick   liquids  DISCHARGE PLAN  Disposition:  Transfer to [place] for ongoing PT, OT and ST  {anticoagulants:31417} for secondary stroke prevention for 3 *** then *** alone.  Recommend ongoing stroke risk factor control by Primary Care Physician at time of discharge from inpatient rehabilitation.  Follow-up PCP Pcp, No in 2 weeks following discharge from rehab.  Follow-up in [facility] in 4 weeks following discharge from rehab, office to schedule an appointment.   45 minutes were spent preparing discharge.

## 2020-02-23 NOTE — Plan of Care (Signed)
  Problem: Clinical Measurements: Goal: Will remain free from infection Outcome: Progressing   Problem: Clinical Measurements: Goal: Ability to maintain clinical measurements within normal limits will improve Outcome: Progressing   Problem: Health Behavior/Discharge Planning: Goal: Ability to manage health-related needs will improve Outcome: Progressing   Problem: Education: Goal: Knowledge of General Education information will improve Description: Including pain rating scale, medication(s)/side effects and non-pharmacologic comfort measures Outcome: Progressing   

## 2020-02-23 NOTE — Progress Notes (Signed)
  Speech Language Pathology Treatment: Dysphagia;Cognitive-Linquistic  Patient Details Name: Jill Clarke MRN: [id number] DOB: 06/07/1985 Today's Date: 02/23/2020 Time: 8016-5537 SLP Time Calculation (min) (ACUTE ONLY): 32 min  Assessment / Plan / Recommendation Clinical Impression  Pt making great strides based on review of prior SLP notes.  Spontaneous communication is improved.  Pt is fluent with no dysarthria.  She followed one-step commands consistently; inaccuracies noted with two-step instructions. Able to name to confrontation; 75% accuracy responsive naming; generative naming most difficult, with output limited to 2-3 items per category. Impaired comprehension of complex/unpredictable conversation.  Improved affect - pt smiling/engaged.  Presents with a mild-mod subcortical aphasia.  Swallowing appears to be improved, with greater attention to POs. Demonstrates some oral holding of liquids.  Thin liquids elicited a delayed cough response; nectars were swallowed with no s/s of aspiration.  Min verbal cues needed to slow rate.  Recommend proceeding with MBS to determine nature of dysphagia. Continue dysphagia 1/nectars pending study. Pt verbalizes understanding.   HPI HPI: 35 yo presented with AMS. MRI >  left frontoparietal hemorrhagic venous infarct (no significant changed 02/15/2020.),  hemorrhagic infarct within the right callosal, possible new punctate acute infarct within the left thalamocapsular junction, bilateral deep watershed white matter infarcts, persistent trace subarachnoid hemorrhage along the left  frontoparietal convexity. Intubated 1/20-1/25. CXR Diffuse bilateral pulmonary infiltrates/edema. PMH: DM, sickle cell trait, asthma.      SLP Plan  Continue with current plan of care       Recommendations  Diet recommendations: Dysphagia 1 (puree);Nectar-thick liquid Liquids provided via: Cup;Straw Medication Administration: Crushed with puree Supervision: Staff to  assist with self feeding;Full supervision/cueing for compensatory strategies Compensations: Minimize environmental distractions;Slow rate;Small sips/bites;Lingual sweep for clearance of pocketing Postural Changes and/or Swallow Maneuvers: Seated upright 90 degrees;Upright 30-60 min after meal                Oral Care Recommendations: Oral care BID Follow up Recommendations: Inpatient Rehab SLP Visit Diagnosis: Dysphagia, unspecified (R13.10);Cognitive communication deficit (R41.841) Plan: Continue with current plan of care       [place]. Tivis Ringer, [place] CCC/SLP Acute Rehabilitation Services Office number [phone] Pager [phone]  [name] 02/23/2020, 11:28 AM

## 2020-02-23 NOTE — Consult Note (Signed)
Physical Medicine and Rehabilitation Consult  Reason for Consult:Stroke with functional deficits.  Referring Physician: Dr. Erlinda Hong   HPI: Jill Clarke is a 35 y.o. Left handed female with history of T2DM, Sickle cell trait who was admitted on 02/12/20 with one week history of H/V, HA and diarrhea. She was seen in ED 02/10/20 with negative work up and discharged to home. She was readmitted on  02/11/20 with nausea and MRI brain done revealing patchy acute infarcts in bilateral cerebral deep watershed white matter and along high left sylvian fissure with focal swelling and small volume SAH. 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  SAH felt to be in setting of significant thrombosis. She was started on hypertonic saline and underwent cerebral angio with revascularization of occluded right and left transverse sinuses and partially of the right sigmoid sinus with improvement in flow and decompression of venous system. She was noted to be somnolent with decreased moment on the right.  and EEG done due to lethargy and showed moderate diffuse encephalopathy with delta slowing in bitemporal region and RUE jerking without definite changes concerning for focal motor seizure--started on Keppra 1000 mg bid. Hypercoagulopathy panel showed low proteins S --to be repeated in 6-8 weeks as may be acute phase reaction.   Repeat MRV showed improved flow but occlusive clot still seen in superior sinus, distal right transverse and right sigmoid dural sinuses and lack of recanalization of vein of [place]. She underwent repeat cerebral angio with complete revascularization of SSS, straight sinus, vein of [place], ICV and right transverse sinus on 01/23. Follow up EEK showed diffuse encephalopathy-->Dr. [name] recommends continuing Keppra bid. On IV heparin and Vanc/Cefepime added  due to possible mastoiditis. She tolerated extubation by 01/25 and started on dysphagia 1, nectars as mentation improved. Transitioned to pradaxa today. Physical Medicine & Rehabilitation was consulted to assess candidacy for CIR given left gaze preference, cognitive deficits, hallucinations, right?left side weakness, delay in processing, and poor awareness of deficits.     Review of Systems  Unable to perform ROS: Mental acuity  Constitutional: Negative for chills and fever.  Eyes: Double vision: on the right today.  Respiratory: Negative for cough and shortness of breath.   Cardiovascular: Negative for chest pain and leg swelling.  Gastrointestinal: Negative for abdominal pain.  Musculoskeletal: Negative for myalgias.  Skin: Negative for rash.  Neurological: Positive for sensory change, focal weakness and headaches.     Past Medical History:  Diagnosis Date  . Asthma    uses inhaler prn  . Diabetes mellitus without complication ([place])   . Sickle cell trait ([place])   . Unspecified high-risk pregnancy 01/27/2011    Past Surgical History:  Procedure Laterality Date  . IR ANGIO INTRA EXTRACRAN SEL COM CAROTID INNOMINATE BILAT MOD SED  02/13/2020  . IR ANGIO INTRA EXTRACRAN SEL INTERNAL CAROTID UNI L MOD SED  02/13/2020  . IR ANGIO INTRA EXTRACRAN SEL INTERNAL CAROTID UNI R MOD SED  02/15/2020  . IR ANGIO VERTEBRAL SEL VERTEBRAL BILAT MOD SED  02/13/2020  . IR CT HEAD LTD  02/13/2020  . IR CT HEAD LTD  02/15/2020  . IR PTA VENOUS EXCEPT DIALYSIS CIRCUIT  02/13/2020  . IR THROMBECT VENO MECH MOD SED  02/13/2020  . IR THROMBECT VENO MECH REPT MOD SED  02/15/2020  . RADIOLOGY WITH ANESTHESIA N/A 02/13/2020   Procedure: IR  WITH ANESTHESIA;  Surgeon: Luanne Bras, MD;  Location: [facility];  Service: Radiology;  Laterality: N/A;  . RADIOLOGY WITH ANESTHESIA N/A 02/15/2020   Procedure: IR WITH ANESTHESIA;  Surgeon: Radiologist, Medication, MD;  Location: [place];  Service: Radiology;  Laterality: N/A;   . TONSILLECTOMY    . WISDOM TOOTH EXTRACTION      Family History  Problem Relation Age of Onset  . Sickle cell trait Mother   . Depression Mother   . Asthma Father   . Drug abuse Father        heroin abuse  . Kidney disease Father        due to heroin abuse  . Diabetes Maternal Grandmother     Social History:  Lives with mother and 2 young children. Was working in housekeeping at [employer]. Per reports that she has quit smoking. She has never used smokeless tobacco. She reports that she does not drink alcohol and does not use drugs.    Allergies: No Known Allergies No medications prior to admission.    Home: Home Living Family/patient expects to be discharged to:: Unsure Living Arrangements: Other relatives Additional Comments: per chart was "in between places" and staying with friends  Functional History: Prior Function Level of Independence: Independent Comments: assumed Functional Status:  Mobility: Bed Mobility Overal bed mobility: Needs Assistance Bed Mobility: Supine to Sit Supine to sit: Total assist,HOB elevated General bed mobility comments: total assist supine to chair position; head leans to right and repositioned to neutral with pillows and towels rolls Transfers Overall transfer level: Needs assistance Equipment used: 2 person hand held assist Transfers: Sit to/from [employer] Sit to Stand: Max assist,+2 physical assistance Stand pivot transfers: Max assist,+2 physical assistance General transfer comment: pt requires R knee block due to mild R knee buckling. transferring to left side as pt demonstrates some R neglect      ADL: ADL Overall ADL's : Needs assistance/impaired Eating/Feeding: NPO Grooming: Oral care,Maximal assistance,Sitting Grooming Details (indicate cue type and reason): able to sustain grasp with wash cloth over yonker tooth brush Upper Body Bathing: Maximal assistance Lower Body Bathing: Total assistance Upper Body  Dressing : Maximal assistance Lower Body Dressing: Total assistance Toilet Transfer: +2 for physical assistance,Maximal assistance General ADL Comments: pt with poor recall of information provided but when questiosn about long term memory making sounds and faces to indicate annoyance with questioning. Pt very strong response to PT cold hands and verbalized so several times. this was information she sustained after phsycial input.  Cognition: Cognition Overall Cognitive Status: Impaired/Different from baseline Arousal/Alertness:  (awake but "foggy") Orientation Level: Oriented to place,Disoriented to situation,Disoriented to time Attention: Sustained Sustained Attention: Impaired Sustained Attention Impairment:  (will continue to assess) Awareness: Impaired Awareness Impairment: Intellectual impairment Problem Solving: Impaired Problem Solving Impairment: Functional basic Behaviors:  (flat affect) Safety/Judgment: Impaired Cognition Arousal/Alertness: Awake/alert Behavior During Therapy: WFL for tasks assessed/performed Overall Cognitive Status: Impaired/Different from baseline Area of Impairment: Orientation,Attention,Memory,Following commands,Safety/judgement,Awareness,Problem solving Orientation Level: Disoriented to,Time,Situation Current Attention Level: Sustained Memory: Decreased short-term memory,Decreased recall of precautions Following Commands: Follows one step commands with increased time Safety/Judgement: Decreased awareness of safety,Decreased awareness of deficits Awareness: Intellectual Problem Solving: Slow processing,Decreased initiation,Difficulty sequencing,Requires verbal cues,Requires tactile cues General Comments: poor recall of information provided during session. pt appears shocked to learn that she has been in hospital 8 days. pt does state she is in acute. Difficult to assess due to: Level of arousal   Blood pressure 115/89, pulse 96, temperature  98.1 F  (36.7 C), temperature source Oral, resp. rate 18, height 5\' 5"  (1.651 m), weight 66 kg, last menstrual period 02/03/2020, SpO2 100 %. Physical Exam  General: Alert, No apparent distress HEENT: Head is normocephalic, atraumatic, PERRLA, EOMI, sclera anicteric, oral mucosa pink and moist, dentition intact, ext ear canals clear,  Neck: Supple without JVD or lymphadenopathy Heart: Reg rate and rhythm. No murmurs rubs or gallops Chest: CTA bilaterally without wheezes, rales, or rhonchi; no distress Abdomen: Soft, non-tender, non-distended, bowel sounds positive. Extremities: No clubbing, cyanosis, or edema. Pulses are 2+ Psych: Pt's affect is appropriate. Pt is cooperative Skin: Clean and intact without signs of breakdown Neuro: Neurological:     Mental Status: She is alert and oriented to person, place, and time.     Comments: Right facial weakness without dysarthria. Slow to process but able to follow simple one step commands with delay. Able to scan to right without cues.  Right > left sided weakness.  4/5 strength with exception of 3/5 in RUE.     Results for orders placed or performed during the hospital encounter of 02/12/20 (from the past 24 hour(s))  Glucose, capillary     Status: None   Collection Time: 02/22/20 11:59 AM  Result Value Ref Range   Glucose-Capillary 97 70 - 99 mg/dL  Glucose, capillary     Status: Abnormal   Collection Time: 02/22/20  3:44 PM  Result Value Ref Range   Glucose-Capillary 131 (H) 70 - 99 mg/dL  Glucose, capillary     Status: Abnormal   Collection Time: 02/22/20  7:37 PM  Result Value Ref Range   Glucose-Capillary 135 (H) 70 - 99 mg/dL   Comment 1 Notify RN    Comment 2 Document in Chart   Glucose, capillary     Status: Abnormal   Collection Time: 02/23/20 12:39 AM  Result Value Ref Range   Glucose-Capillary 134 (H) 70 - 99 mg/dL   Comment 1 Notify RN    Comment 2 Document in Chart   Glucose, capillary     Status: Abnormal   Collection Time:  02/23/20  4:35 AM  Result Value Ref Range   Glucose-Capillary 103 (H) 70 - 99 mg/dL   Comment 1 Notify RN    Comment 2 Document in Chart   Heparin level (unfractionated)     Status: None   Collection Time: 02/23/20  4:50 AM  Result Value Ref Range   Heparin Unfractionated 0.45 0.30 - 0.70 IU/mL  CBC     Status: Abnormal   Collection Time: 02/23/20  4:50 AM  Result Value Ref Range   WBC 10.3 4.0 - 10.5 K/uL   RBC 3.21 (L) 3.87 - 5.11 MIL/uL   Hemoglobin 9.6 (L) 12.0 - 15.0 g/dL   HCT 10.3 (L) 01.3 - 14.3 %   MCV 91.9 80.0 - 100.0 fL   MCH 29.9 26.0 - 34.0 pg   MCHC 32.5 30.0 - 36.0 g/dL   RDW 88.8 75.7 - 97.2 %   Platelets 512 (H) 150 - 400 K/uL   nRBC 0.0 0.0 - 0.2 %  Basic metabolic panel     Status: Abnormal   Collection Time: 02/23/20  4:50 AM  Result Value Ref Range   Sodium 138 135 - 145 mmol/L   Potassium 3.3 (L) 3.5 - 5.1 mmol/L   Chloride 106 98 - 111 mmol/L   CO2 23 22 - 32 mmol/L   Glucose, Bld 116 (H) 70 - 99 mg/dL  BUN <5 (L) 6 - 20 mg/dL   Creatinine, Ser <0.30 (L) 0.44 - 1.00 mg/dL   Calcium 9.2 8.9 - 10.3 mg/dL   GFR, Estimated NOT CALCULATED >60 mL/min   Anion gap 9 5 - 15  Glucose, capillary     Status: Abnormal   Collection Time: 02/23/20  7:25 AM  Result Value Ref Range   Glucose-Capillary 110 (H) 70 - 99 mg/dL   Comment 1 Notify RN    Comment 2 Document in Chart    No results found.   Assessment/Plan: Diagnosis: Deep watershed white matter infarct/SAH 1. Does the need for close, 24 hr/day medical supervision in concert with the patient's rehab needs make it unreasonable for this patient to be served in a less intensive setting? Yes 2. Co-Morbidities requiring supervision/potential complications:  1. Asthma 2. sickle cell trait 3. Altered mental status 4. Decreased arousal: continue amantadine 5. Cerebral edema: Na reviewed, has been trending downward.  3. Due to bladder management, bowel management, safety, skin/wound care, disease  management, medication administration, pain management and patient education, does the patient require 24 hr/day rehab nursing? Yes 4. Does the patient require coordinated care of a physician, rehab nurse, therapy disciplines of PT, OT, SLP to address physical and functional deficits in the context of the above medical diagnosis(es)? Yes Addressing deficits in the following areas: balance, endurance, locomotion, strength, transferring, bowel/bladder control, bathing, dressing, feeding, grooming, toileting, cognition and psychosocial support 5. Can the patient actively participate in an intensive therapy program of at least 3 hrs of therapy per day at least 5 days per week? Yes 6. The potential for patient to make measurable gains while on inpatient rehab is excellent 7. Anticipated functional outcomes upon discharge from inpatient rehab are modified independent  with PT, supervision with OT, supervision with SLP. 8. Estimated rehab length of stay to reach the above functional goals is: 10-14 days 9. Anticipated discharge destination: Home 10. Overall Rehab/Functional Prognosis: excellent  RECOMMENDATIONS: This patient's condition is appropriate for continued rehabilitative care in the following setting: CIR Patient has agreed to participate in recommended program. Yes Note that insurance prior authorization may be required for reimbursement for recommended care.  Comment: Thank you for this consult. Admission coordinator to follow.   I have personally performed a face to face diagnostic evaluation, including, but not limited to relevant history and physical exam findings, of this patient and developed relevant assessment and plan.  Additionally, I have reviewed and concur with the physician assistant's documentation above.  Leeroy Cha, MD  Bary Leriche, PA-C 02/23/2020

## 2020-02-23 NOTE — Progress Notes (Signed)
Physical Therapy Treatment Patient Details Name: Jill Clarke MRN: [id number] DOB: August 23, 1985 Today's Date: 02/23/2020    History of Present Illness 35 y.o. female with a history significant for asthma and sickle cell trait presented to ED 1/19 for N/V and discharged home. Returned 1/20 due to AMS. CT Head revealing a small volume subarachnoid hemorrhage and hyperdense dural sinuses with a filling defect concerning for dural venous thrombosis. 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 Intubated 1/21-1/25.    PT Comments    Pt with decreased insight to safety and deficits in addition to impulsivity. Pt is progressing functionally and began gait training today. R side is weaker than L but motor planning and coordination deficits noted in both. Continue to recommend CIR upon d/c. Acute PT to cont to follow.    Follow Up Recommendations  CIR     Equipment Recommendations  Wheelchair (measurements PT);Wheelchair cushion (measurements PT);Hospital bed;Other (comment);Rolling walker with 5" wheels    Recommendations for Other Services Rehab consult     Precautions / Restrictions Precautions Precautions: Other (comment) Precaution Comments: fell earlier this morning when attempting to get out of bed on own Restrictions Weight Bearing Restrictions: No    Mobility  Bed Mobility Overal bed mobility: Needs Assistance Bed Mobility: Supine to Sit     Supine to sit: Mod assist     General bed mobility comments: max directional verbal and tactile cues to use R UE to reach across for bed rail and to move LEs, minA for trunk elevation  Transfers Overall transfer level: Needs assistance Equipment used: 2 person hand held assist Transfers: Sit to/from Stand Sit to Stand: Mod assist;+2 physical assistance         General transfer comment: max directional verbal cues, L lateral lean  Ambulation/Gait Ambulation/Gait  assistance: Mod assist;+2 safety/equipment Gait Distance (Feet): 10 Feet (x1, 30x1) Assistive device: 2 person hand held assist Gait Pattern/deviations: Step-through pattern;Decreased stride length Gait velocity: slow Gait velocity interpretation: <1.8 ft/sec, indicate of risk for recurrent falls General Gait Details: pt initally requiring maxA to advance R LE but then progressed to [employer]    Modified Rankin (Stroke Patients Only) Modified Rankin (Stroke Patients Only) Pre-Morbid Rankin Score: No symptoms Modified Rankin: Severe disability     Balance Overall balance assessment: Needs assistance Sitting-balance support: Bilateral upper extremity supported;Single extremity supported;No upper extremity supported;Feet supported Sitting balance-Leahy Scale: Zero Sitting balance - Comments: modA with cues, maxA intermittently due to impaired attention to balance Postural control: Left lateral lean;Right lateral lean;Posterior lean Standing balance support: Bilateral upper extremity supported Standing balance-Leahy Scale: Zero Standing balance comment: maxA x2                            Cognition Arousal/Alertness: Awake/alert Behavior During Therapy: Impulsive Overall Cognitive Status: Impaired/Different from baseline Area of Impairment: Orientation;Attention;Memory;Following commands;Safety/judgement;Awareness;Problem solving                 Orientation Level: Disoriented to;Situation;Time Current Attention Level: Sustained Memory: Decreased short-term memory Following Commands: Follows one step commands inconsistently;Follows one step commands with increased time Safety/Judgement: Decreased awareness of safety;Decreased awareness of deficits (fell this morning trying to get up on own) Awareness: Intellectual Problem Solving: Slow processing;Decreased initiation;Difficulty sequencing;Requires verbal cues;Requires  tactile cues General Comments: pt stating its 2019 and doesn't believe  it's 2022, pt impulsive with poor recall on why she needs to call for assist when wanting to get up      Exercises      General Comments General comments (skin integrity, edema, etc.): VSS      Pertinent Vitals/Pain Pain Assessment: No/denies pain    Home Living                      Prior Function            PT Goals (current goals can now be found in the care plan section) Progress towards PT goals: Progressing toward goals    Frequency    Min 3X/week      PT Plan Discharge plan needs to be updated    Co-evaluation              AM-PAC PT "6 Clicks" Mobility   Outcome Measure  Help needed turning from your back to your side while in a flat bed without using bedrails?: A Little Help needed moving from lying on your back to sitting on the side of a flat bed without using bedrails?: A Little Help needed moving to and from a bed to a chair (including a wheelchair)?: A Little Help needed standing up from a chair using your arms (e.g., wheelchair or bedside chair)?: A Lot Help needed to walk in hospital room?: A Lot Help needed climbing 3-5 steps with a railing? : Total 6 Click Score: 14    End of Session Equipment Utilized During Treatment: Gait belt Activity Tolerance: Patient tolerated treatment well Patient left: in chair;with call bell/phone within reach;with chair alarm set Nurse Communication: Mobility status;Need for lift equipment PT Visit Diagnosis: Other symptoms and signs involving the nervous system (Z00.174)     Time: 9449-6759 PT Time Calculation (min) (ACUTE ONLY): 30 min  Charges:  $Gait Training: 23-37 mins                     Kittie Plater, PT, DPT Acute Rehabilitation Services Pager #: [phone] Office #: [phone]    Berline Lopes 02/23/2020, 1:56 PM

## 2020-02-23 NOTE — Progress Notes (Signed)
Inpatient Rehab Admissions Coordinator:   I met with Pt. For ongoing discussion regarding potential CIR admission. I am opening a case with her insurance today. Will follow for potential admission later this week, pending bed availability and insurance auth.   Clemens Catholic, [place], [place] Admissions Coordinator  [phone] ([place]) [phone] (office)

## 2020-02-23 NOTE — Progress Notes (Signed)
ANTICOAGULATION CONSULT NOTE  Pharmacy Consult:  IV Heparin Indication: Dural venous sinus thrombosis, S/P thrombectomy  Labs: Recent Labs    02/21/20 0122 02/22/20 0041 02/23/20 0450  HGB 8.9* 9.5* 9.6*  HCT 28.2* 29.9* 29.5*  PLT 498* 470* 512*  HEPARINUNFRC 0.58 0.46 0.45  CREATININE 0.37* 0.37* <0.30*   Assessment: 35 yr old female with AMS, found to have a small volume subarachnoid hemorrhage and extensive dural venous thrombosis. Pharmacy was consulted to manage IV heparin per stroke protocol. She is S/P thrombectomy in IR on 1/21 and venogram, complete revascularization, angioplasty in IR on 1/23. Heparin was resumed post-procedure.  Heparin level therapeutic, HgB stable  No active bleed issues reported.  Goal of Therapy:  Heparin level 0.3-0.5 units/ml Monitor platelets by anticoagulation protocol: Yes   Plan:  Continue heparin at 1100 units / hr Monitor daily heparin level and CBC, s/sx bleeding  Planning po anti-coagulation when more stable  Thank you Anette Guarneri, PharmD  02/23/2020 8:50 AM

## 2020-02-23 NOTE — Progress Notes (Addendum)
STROKE TEAM PROGRESS NOTE  INTERVAL HISTORY No acute overnight events. Patient fell this morning when she got out of bed on her own but did not hit her head and no obvious injury. Patient evaluated at bedside this morning, no family present in the room.  She is alert, active, cooperative with examination this morning.  Denies any new complaints and understands that she is in hospital as she had stroke.  States she would be going to inpatient rehabilitation to get better.  Pharmacy is consulted to change IV heparin to Pradaxa this morning, appreciate their assistance.  Patient cannot tolerate orally, started Lipitor 40 mg today.  Blood pressure is well controlled.  Neurologically patient continues to improve.  Vital signs stable.  Blood pressure adequately controlled.  Hematocrit is stable. Vitals:   02/23/20 0000 02/23/20 0322 02/23/20 0800 02/23/20 0811  BP: 120/88 117/88  115/89  Pulse: 93 91  96  Resp: 17 18 16 18   Temp: 98.1 F (36.7 C) 98.8 F (37.1 C)  98.1 F (36.7 C)  TempSrc: Oral Oral  Oral  SpO2: 100% 100%  100%  Weight:      Height:       CBC:  Recent Labs  Lab 02/22/20 0041 02/23/20 0450  WBC 10.2 10.3  HGB 9.5* 9.6*  HCT 29.9* 29.5*  MCV 92.6 91.9  PLT 470* 993*   Basic Metabolic Panel:  Recent Labs  Lab 02/18/20 0535 02/18/20 1325 02/21/20 0122 02/22/20 0041 02/23/20 0450  NA 145   < > 137 138 138  K 3.8   < > 3.1* 3.2* 3.3*  CL 111   < > 106 106 106  CO2 28   < > 25 24 23   GLUCOSE 111*   < > 129* 122* 116*  BUN 5*   < > <5* <5* <5*  CREATININE 0.37*   < > 0.37* 0.37* <0.30*  CALCIUM 8.8*   < > 8.9 9.0 9.2  MG 2.2  --  2.0  --   --   PHOS 4.1  --   --   --   --    < > = values in this interval not displayed.   Lipid Panel:  Recent Labs  Lab 02/17/20 0500  TRIG 86    IMAGING past 24 hours  MR Brain 02/18/2020 1. Motion degraded exam. 2. The dominant acute/subacute left frontoparietal hemorrhagic venous infarct has not significantly changed  from the MRI of 02/15/2020. 3. Restricted diffusion at site of an acute/subacute hemorrhagic infarct within the right callosal splenium has subtly increased in extent. 4. T2/FLAIR hyperintensity within the deep gray nuclei persists but has diminished. However, there may be a new punctate acute infarct within the left thalamocapsular junction. 5. 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 6. Persistent trace subarachnoid hemorrhage along the left frontoparietal convexity.  MRV Head 02/18/2020 1. Flow within the superior sagittal sinus has significantly improved since the prior MRV of 02/15/2020. However, there are persistent foci of nonocclusive thrombus within this vessel. 2. Nonocclusive thrombus within the left transverse and proximal sigmoid dural venous sinuses has increased. 3. Persistent nonocclusive thrombus within the right transverse sinus. 4. Persistent near occlusive or occlusive thrombus within the right sigmoid sinus and visualized right internal jugular vein. 5. The degree of flow within the deep venous system is unchanged. 6. The veins of Trolard and a few additional cerebral veins overlying the bilateral frontoparietal convexities remain thrombosed.  Echocardiogram 02/16/2020 IMPRESSIONS  1. Left ventricular ejection fraction, by estimation, is 55 to 60%. The  left ventricle has normal function. The left ventricle has no regional  wall motion abnormalities. Left ventricular diastolic parameters were  normal.  2. Right ventricular systolic function is normal. The right ventricular  size is normal. There is normal pulmonary artery systolic pressure.  3. Mild mitral valve leaflet thickening and calcification. Trivial mitral  valve regurgitation.  4. The aortic valve is tricuspid. There is mild thickening of the aortic  valve. Aortic valve regurgitation is not visualized. No aortic stenosis is   present.  5. The inferior vena cava is normal in size with <50% respiratory  variability, suggesting right atrial pressure of 8 mmHg.   CT Head wo contrast 02/16/2020 IMPRESSION: 1. Continued interval decrease in conspicuity of scattered small volume subarachnoid hemorrhage involving both frontoparietal convexities, now only faintly visible and nearly resolved. 2. Continued interval evolution of bilateral watershed territory infarcts, most pronounced at the left frontoparietal convexity. Overall size and distribution is relatively similar. No hemorrhagic transformation by CT. 3. Persistent but improved hyperdensity about the veins of Trolard since prior CT. Likewise, the remainder of the dural sinuses also appear less dense and engorged as compared to previous. 4. No other new acute intracranial abnormality.  CT Head wo contrast 02/14/2020 IMPRESSION: Small volume bilateral frontoparietal and basilar cistern subarachnoid hemorrhage is unchanged. No new intracranial hemorrhage. Small bilateral cerebral infarcts are not well demonstrated on this exam. Increased conspicuity of left cerebral edema. Hyperdense appearance of the cortical veins, superior sagittal and right transverse sinus is less conspicuous than prior exam.  IR Thrombectomy 02/13/2020 Findings. 1.Extensive thrombus load in the TS bilaterally,the Rt SS ,RT IJV and post one third of the SSS. Large chunks of clot removed with revascularization of the TS bilaterally and partially the RT SS and the torcula. Sig decreased venous congestion of the cortical veins noted post procedure. Post CT brain No ICH or mass effect or hydrocephalus. Clinically stable with pupils 93mm sluggish.  CT Head wo contrast 02/12/2020 IMPRESSION: 1. The study is positive for a small volume of subarachnoid hemorrhage. There is no midline shift. 2. Hyperdense dural sinuses and cortical veins especially on the right in addition to a filling  defect within the transverse sinus on the right is concerning for dural venous thrombosis and may be the source of the patient's subarachnoid hemorrhage. Emergent MRI/MRV is recommended for further evaluation.  MR Brain wo contrast MR Venogram Head 02/12/2020 IMPRESSION: 1. Extensive, acute dural venous sinus thrombosis involving the superior sagittal sinus into the right internal jugular vein via the dominant right transverse and sigmoid system. Both veins of Trolard are thrombosed and there is straight sinus/deep cerebral thrombosis as well. 2. Deep watershed white matter infarcts, small volume subarachnoid hemorrhage, and left more than right frontoparietal cortical swelling due to #1.  MR Brain wo contrast MR Venogram Head 02/15/2020  Brain MRI IMPRESSION: 1. Progressive left perirolandic venous infarction and petechial hemorrhage. 2. New edema in the deep gray nuclei without infarction.  Intracranial MRV: IMPRESSION: 1. Improved flow especially in the deep venous system. Improved flow also seen in the proximal right transverse sinus. 2. Occlusive clot still seen in the superior sagittal, distal right transverse, and right sigmoid dural sinuses. No recanalization of the veins of Trolard.  EEG adult 02/12/2020  ABNORMALITY -Continue slow, generalized IMPRESSION: This study is suggestive of moderate diffuse encephalopathy, nonspecific etiology.  At 1531, patient was noted to have right upper extremity jerking.  Concomitant EEG showed high amplitude 2 to 3 Hz rhythmic delta slowing in bitemporal region with evolution in morphology and amplitude but without definite evolution in frequency concerning for focal motor seizure.  EEG adult  02/16/2020 IMPRESSION: This study is suggestive of moderate to severe diffuse encephalopathy, nonspecific etiology.  No seizures or epileptiform discharges were seen throughout the recording.  PHYSICAL EXAM GENERAL: Well developed, young  female sitting comfortably in bed, NAD Head: Normocephalic and atraumatic. EENT: No OP obstruction, normal conjunctiva.  LUNGS - Normal respiratory effort on room air CV - Tachycardic to the 130's on cardiac monitor, 2+ pedal pulses ABDOMEN - Soft, non-distended Ext: warm, well perfused Neurological:  Mental Status:  Alert, awake, oriented to time, person, place.  Follow midline and appendicular commands.  Speech is improving but is slightly nonfluent with some word finding difficulties.  No paraphasic errors.  Good comprehension.  Able to name only 4 animals which can walk on 4 legs.  Difficulty with calculation.. Cranial Nerves: Mid line gaze today, able to move eyes around appropriately, saccadic eye movements.  Face is asymmetric, decreased nasolabial fold on right.  Tongue midline.   Motor: Able to move all extremities on command.  Motor strength 4-/5 in left extremities and 4/5 in lower right extremity, 3/5 in upper extremity.  But effort is variable and poor sensory: Unable to access. Deep Tendon Reflexes:  Right: Upper Extremity   Left: Upper extremity   biceps (C-5 to C-6) 2/4   biceps (C-5 to C-6) 3/4 tricep (C7) 2/4    triceps (C7) 3/4 Brachioradialis (C6) 2/4  Brachioradialis (C6) 3/4  Lower Extremity                                   Lower Extremity  quadriceps (L-2 to L-4) 2/4   quadriceps (L-2 to L-4) 3/4 Achilles (S1) 2/4   Achilles (S1) 3/4 Gait: Deferred   ASSESSMENT/PLAN Jill Clarke is a 35 y.o. female with PMHx of asthma and sickle cell trait presented with altered mental status.  CT head revealed a small volume subarachnoid hemorrhage and hyperdense dural sinuses with filling defect concerning for dural venous thrombosis.  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  Extremely delicate situation, that is why, IR is consulted for thrombectomy and patient is with IR now.  CVST - involving SSS,  straight sinus, deep cerebral vein, right TS, SS and IJ as well as b/l veins of Trolard, likely due to dehydration at this point but looking into hypercoagulable state Venous infarcts - Deep watershed white matter infarct, left frontoparietal cortical infarct and small volume subarachnoid hemorrhage   Code Stroke CT head: positive for a small volume of SAH.    MRI  and MRV 02/12/20 A). Extensive, acute dural venous sinus thrombosis involving the superior sagittal sinus into the right internal jugular vein via the dominant right transverse and sigmoid system. B).Both veins of Trolard are thrombosed and there is straight sinus/deep cerebral thrombosis as well. C). Deep watershed white matter infarcts, small volume subarachnoid hemorrhage, and left more than right frontoparietal cortical swelling due to #1.  IR Thrombectomy: 02/13/2020- Large chunks of clot removed with revascularization of the TS bilaterally and partially the RT SS and the torcula.Sig decreased venous congestion of the cortical veins noted post procedure.  MRI and MRV 02/15/20 - Improved flow especially in the deep venous system.  Improved flow also seen in the proximal right transverse sinus. Occlusive clot still seen in the superior sagittal, distal right transverse, and right sigmoid dural sinuses. No recanalization of the veins of Trolard. Progressive left perirolandic venous infarction and petechial hemorrhage. New edema in the deep gray nuclei without infarction.  IR Thrombectomy: 02/15/2020 - complete revascularization of SSS,Straight S,V of [place] and ICVs ,and RT TS. Sig focal stenosis at the RT TS/SS junction angioplastied with balloon with  improved flow into RT SS.  MRI and MRV 02/18/20: acute/subacute left frontoparietal and right CC hemorrhagic venous infarct, b/l deep watershed white matter infarcts appear similar to the prior MRI. Flow within the SSS has significantly improved since the prior MRV of 02/15/2020. However, there are  persistent foci of occlusive or nonocclusive thrombus within SSS, b/l TS, b/l SS, and right IJ. Degree of flow within deep venous system unchanged, veins of Trolard remain to be thrombosed  2D Echo - EF is 55 to 60%.  LDL 108  HgbA1c 4.9   Hypercoagulable panel: mildly decreased protein S, but may be an acute phase reactant and should be rechecked as an outpatient in 6 to 8 weeks.  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  On keppra 1000 bid  Repeat EEG 02/16/20 - moderate to severe diffuse encephalopathy  Continue keppra  Respiratory failure  Intubated on sedation -> extubated 1/25  Tolerating well so far  Hypotension  BP on the low end  Phenylephrine started now off  . On IVF and TF . BP goal 120-160 . Long-term BP goal normotensive  Anemia - acute blood loss  Hgb - 11.0->8.2->8.6->7.2->7.0->8.2->8.0->8.2>9.4>8.9>9.5>9.6  May related to repeated procedure and IV heparin  1 unit PRBC transfusion on 02/16/2020  CBC monitoring  Hyperlipidemia  Home meds: None  LDL 108, goal < 70  Start Lipitor 40 mg  Continue statin at discharge  Dysphagia   Off OG 02/17/20  Thin liquid  diet  Other Stroke Risk Factors  Sickle Cell Trait: No associated risk found due to sickle cell trait.    Other Active Problems  Asthma   Hypokalemia - potassium - 2.9->3.0->3.6->3.1 >3.8->2.8->3.4->3.1-> supplement ->3.3-> supplement  Hypophosphatemia - Phosphorus - 2.0->1.3->1.9>4.1  Leukocytosis - WBC's - 13.3->15.9 (afebrile) >15.2->12.3->10.3 (afebrile)  Mastoiditis -> Unasyn started 02/18/20  Hospital day # 11   02/23/2020 12:59 PM  ATTENDING NOTE: I have personally obtained history,examined this patient, reviewed notes, independently viewed imaging studies, participated in medical decision making and plan of care.ROS completed by me personally and pertinent positives fully documented  I have made any additions or clarifications directly to the above note. Agree with note above.  Continue ongoing physical occupational and speech therapy.  Change IV heparin to Pradaxa for anticoagulation for 6 to 9 months.  Transfer to inpatient rehab when bed available hopefully over the next few days.  Greater than 50% time during this 35-minute visit was spent on counseling and coordination of care about her hemorrhagic stroke and discussion with care team and answering questions.  Antony Contras, MD  Antony Contras, MD Medical Director [place] Pager: [phone] 02/23/2020 3:43 PM       To contact Stroke Continuity provider, please refer to [email or web address] After hours, contact General Neurology

## 2020-02-24 ENCOUNTER — Encounter (HOSPITAL_COMMUNITY): Payer: Self-pay

## 2020-02-24 ENCOUNTER — Inpatient Hospital Stay (HOSPITAL_COMMUNITY): Payer: 59

## 2020-02-24 LAB — GLUCOSE, CAPILLARY
Glucose-Capillary: 101 mg/dL — ABNORMAL HIGH (ref 70–99)
Glucose-Capillary: 115 mg/dL — ABNORMAL HIGH (ref 70–99)
Glucose-Capillary: 125 mg/dL — ABNORMAL HIGH (ref 70–99)
Glucose-Capillary: 135 mg/dL — ABNORMAL HIGH (ref 70–99)
Glucose-Capillary: 149 mg/dL — ABNORMAL HIGH (ref 70–99)
Glucose-Capillary: 163 mg/dL — ABNORMAL HIGH (ref 70–99)

## 2020-02-24 LAB — BASIC METABOLIC PANEL
Anion gap: 8 (ref 5–15)
BUN: <5 mg/dL — ABNORMAL LOW (ref 6–20)
CO2: 22 mmol/L (ref 22–32)
Calcium: 8.7 mg/dL — ABNORMAL LOW (ref 8.9–10.3)
Chloride: 111 mmol/L (ref 98–111)
Creatinine, Ser: 0.39 mg/dL — ABNORMAL LOW (ref 0.44–1.00)
GFR, Estimated: >60 mL/min (ref >60)
Glucose, Bld: 124 mg/dL — ABNORMAL HIGH (ref 70–99)
Potassium: 3.3 mmol/L — ABNORMAL LOW (ref 3.5–5.1)
Sodium: 141 mmol/L (ref 135–145)

## 2020-02-24 MED ORDER — POTASSIUM CHLORIDE 20 MEQ PO PACK: 40.0000 meq | PACK | Freq: Two times a day (BID) | ORAL | Status: DC

## 2020-02-24 MED ORDER — POTASSIUM CHLORIDE 20 MEQ PO PACK
40.0000 meq | PACK | Freq: Two times a day (BID) | ORAL | Status: AC
  Administered 2020-02-24: 40 meq via ORAL
  Filled 2020-02-24 (×2): qty 2

## 2020-02-24 MED ORDER — LEVETIRACETAM 500 MG PO TABS
500.0000 mg | ORAL_TABLET | Freq: Two times a day (BID) | ORAL | Status: DC
  Administered 2020-02-24 – 2020-02-28 (×9): 500 mg via ORAL
  Filled 2020-02-24 (×9): qty 1

## 2020-02-24 MED ORDER — POTASSIUM CHLORIDE 20 MEQ PO PACK: 80.0000 meq | PACK | Freq: Once | ORAL | Status: DC

## 2020-02-24 MED ORDER — PANTOPRAZOLE SODIUM 40 MG PO TBEC
40.0000 mg | DELAYED_RELEASE_TABLET | Freq: Every day | ORAL | Status: DC
  Administered 2020-02-24 – 2020-02-28 (×5): 40 mg via ORAL
  Filled 2020-02-24 (×5): qty 1

## 2020-02-24 NOTE — Progress Notes (Signed)
Physical Therapy Treatment Patient Details Name: Jill Clarke MRN: [id number] DOB: [birth year] Today's Date: 02/24/2020    History of Present Illness 35 y.o. female with a history significant for asthma and sickle cell trait presented to ED 1/19 for N/V and discharged home. Returned 1/20 due to AMS. CT Head revealing a small volume subarachnoid hemorrhage and hyperdense dural sinuses with a filling defect concerning for dural venous thrombosis. 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 Intubated 1/21-1/25.    PT Comments    Pt progressing towards all goals however remains significantly both cognitively and functionally impaired. Pt with R sided neglect, decreased attn span, easily distracted, delayed processing, R sided weakness requiring assist for transfers and ambulation. Pt with poor insight to deficits and safety as when PT/OT entered pt trying to stand over trash can to urinate. Pt remains appropriate for CIR for maximal functional recovery as pt was indep and working PTA. Acute PT to cont to follow.    Follow Up Recommendations  CIR     Equipment Recommendations  Rolling walker with 5" wheels    Recommendations for Other Services Rehab consult     Precautions / Restrictions Precautions Precautions: Other (comment) Precaution Comments: Fell on 1/31 when attmepting to get OOB alone    Mobility  Bed Mobility               General bed mobility comments: pt up in chair upon PT arrival  Transfers Overall transfer level: Needs assistance Equipment used: 2 person hand held assist Transfers: Sit to/from Stand Sit to Stand: Min assist;+2 safety/equipment         General transfer comment: minA for L LE stability, max directional verbal cues for safe hand placement, pt with lack of R UE use requiring reminders to use  Ambulation/Gait Ambulation/Gait assistance: Mod assist;+2 safety/equipment    Assistive device: Rolling walker (2 wheeled);1 person hand held assist Gait Pattern/deviations: Step-through pattern;Decreased stride length Gait velocity: slow Gait velocity interpretation: <1.8 ft/sec, indicate of risk for recurrent falls General Gait Details: began amb with RW, pt requiring maxA to maintain R UE grip on RW, R LE in external rotation and supination with R knee instability, pt requiring maxA for RW management eventually transistioning to R UE HHA via OT to promote WBing with PT providing truncal and R hip stability. Pt with shorter step length and wider base of support compared to with RW. Pt with continued freq distractibiltiy causing pt with freq standing breaks   Stairs             Wheelchair Mobility    Modified Rankin (Stroke Patients Only) Modified Rankin (Stroke Patients Only) Pre-Morbid Rankin Score: No symptoms Modified Rankin: Moderately severe disability     Balance Overall balance assessment: Needs assistance Sitting-balance support: No upper extremity supported;Feet supported Sitting balance-Leahy Scale: Fair Sitting balance - Comments: modA with cues, maxA intermittently due to impaired attention to balance Postural control: Left lateral lean;Right lateral lean;Posterior lean Standing balance support: No upper extremity supported;During functional activity;Single extremity supported;Bilateral upper extremity supported Standing balance-Leahy Scale: Poor Standing balance comment: Reliant on UE support and/or physical A                            Cognition Arousal/Alertness: Awake/alert Behavior During Therapy: Impulsive;Flat affect Overall Cognitive Status: Impaired/Different from baseline Area of Impairment: Orientation;Attention;Memory;Following commands;Safety/judgement;Awareness;Problem solving  Orientation Level: Disoriented to;Time (Using external clues for stating month and day of week, stated at [employer] but when asked if this place looks like that she said "no, i'm at [facility] [place]") Current Attention Level: Sustained;Focused (Very short sustained attention. Easily distracted both internally and externally requiring freq re-direction) Memory: Decreased short-term memory;Decreased recall of precautions (attempted to get up and was trying to pee in trash cane) Following Commands: Follows one step commands inconsistently;Follows one step commands with increased time Safety/Judgement: Decreased awareness of deficits;Decreased awareness of safety Awareness: Intellectual Problem Solving: Slow processing;Decreased initiation;Difficulty sequencing;Requires verbal cues;Requires tactile cues General Comments: pt requiring freq directional verbal cues to ambulate around circle in hallway, freq stop due to distractibility      Exercises      General Comments General comments (skin integrity, edema, etc.): VSS, worked with OT at sink, pt with LOB requiring modA to prevent fall, pt with noted R sided inattention/neglect      Pertinent Vitals/Pain Pain Assessment: No/denies pain    Home Living Family/patient expects to be discharged to:: Unsure                    Prior Function            PT Goals (current goals can now be found in the care plan section) Acute Rehab PT Goals Patient Stated Goal: mickey mouse with kids PT Goal Formulation: Patient unable to participate in goal setting Time For Goal Achievement: 03/02/20 Potential to Achieve Goals: Fair    Frequency    Min 4X/week      PT Plan Current plan remains appropriate;Frequency needs to be updated    Co-evaluation PT/OT/SLP Co-Evaluation/Treatment: Yes Reason for Co-Treatment: To address functional/ADL transfers PT goals addressed during session: Mobility/safety with mobility OT goals addressed during session: ADL's and self-care      AM-PAC PT "6 Clicks" Mobility   Outcome Measure  Help needed  turning from your back to your side while in a flat bed without using bedrails?: A Little Help needed moving from lying on your back to sitting on the side of a flat bed without using bedrails?: A Little Help needed moving to and from a bed to a chair (including a wheelchair)?: A Little Help needed standing up from a chair using your arms (e.g., wheelchair or bedside chair)?: A Lot Help needed to walk in hospital room?: A Lot Help needed climbing 3-5 steps with a railing? : Total 6 Click Score: 14    End of Session Equipment Utilized During Treatment: Gait belt Activity Tolerance: Patient tolerated treatment well Patient left: in chair;with call bell/phone within reach;with chair alarm set Nurse Communication: Mobility status;Need for lift equipment PT Visit Diagnosis: Other symptoms and signs involving the nervous system (Z66.063)     Time: 1453-1520 PT Time Calculation (min) (ACUTE ONLY): 27 min  Charges:  $Gait Training: 8-22 mins                     Kittie Plater, PT, DPT Acute Rehabilitation Services Pager #: [phone] Office #: [phone]    Berline Lopes 02/24/2020, 4:17 PM

## 2020-02-24 NOTE — Progress Notes (Signed)
Pt. Would not let me access her Picc for labs.

## 2020-02-24 NOTE — Progress Notes (Signed)
STROKE TEAM PROGRESS NOTE  INTERVAL HISTORY No acute overnight events. Pt is evaluated at bedside this morning. No family present in the room. She is alert, active, cooperative with examination. Pt denies any new complaints. States she is excited about starting regular diet. Heparin changed to Pradaxa yesterday. Started on Lipitor yesterday. Speech eval and Barium swallow study done. Recommended starting regular diet. BP controlled. Vitals stable. Pt is afebrile. Pt is improving neurologically. Hematocrit stable. K low at 3.3.    Vitals:   02/24/20 0800 02/24/20 0900 02/24/20 1219 02/24/20 1623  BP:  (!) 151/76 129/88 112/72  Pulse:  70 89 (!) 106  Resp: 18 18 19 19   Temp:  98 F (36.7 C) 98.6 F (37 C) 98 F (36.7 C)  TempSrc:   Oral Oral  SpO2:  93% 100% 100%  Weight:      Height:       CBC:  Recent Labs  Lab 02/22/20 0041 02/23/20 0450  WBC 10.2 10.3  HGB 9.5* 9.6*  HCT 29.9* 29.5*  MCV 92.6 91.9  PLT 470* 454*   Basic Metabolic Panel:  Recent Labs  Lab 02/18/20 0535 02/18/20 1325 02/21/20 0122 02/22/20 0041 02/23/20 0450 02/24/20 0500  NA 145   < > 137   < > 138 141  K 3.8   < > 3.1*   < > 3.3* 3.3*  CL 111   < > 106   < > 106 111  CO2 28   < > 25   < > 23 22  GLUCOSE 111*   < > 129*   < > 116* 124*  BUN 5*   < > <5*   < > <5* <5*  CREATININE 0.37*   < > 0.37*   < > <0.30* 0.39*  CALCIUM 8.8*   < > 8.9   < > 9.2 8.7*  MG 2.2  --  2.0  --   --   --   PHOS 4.1  --   --   --   --   --    < > = values in this interval not displayed.   Lipid Panel:  No results for input(s): CHOL, TRIG, HDL, CHOLHDL, VLDL, LDLCALC in the last 168 hours.  IMAGING past 24 hours  MR Brain 02/18/2020 1. Motion degraded exam. 2. 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 3. Restricted diffusion at site of an acute/subacute hemorrhagic infarct within the right callosal splenium has subtly  increased in extent. 4. T2/FLAIR hyperintensity within the deep gray nuclei persists but has diminished. However, there may be a new punctate acute infarct within the left thalamocapsular junction. 5. 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 6. Persistent trace subarachnoid hemorrhage along the left frontoparietal convexity.  MRV Head 02/18/2020 1. Flow within the superior sagittal sinus has significantly improved since the prior MRV of 02/15/2020. However, there are persistent foci of nonocclusive thrombus within this vessel. 2. Nonocclusive thrombus within the left transverse and proximal sigmoid dural venous sinuses has increased. 3. Persistent nonocclusive thrombus within the right transverse sinus. 4. Persistent near occlusive or occlusive thrombus within the right sigmoid sinus and visualized right internal jugular vein. 5. The degree of flow within the deep venous system is unchanged. 6. The veins of Trolard and a few additional cerebral veins overlying the bilateral frontoparietal convexities remain thrombosed.  Echocardiogram 02/16/2020 IMPRESSIONS  1. Left ventricular ejection  fraction, by estimation, is 55 to 60%. The  left ventricle has normal function. The left ventricle has no regional  wall motion abnormalities. Left ventricular diastolic parameters were  normal.  2. Right ventricular systolic function is normal. The right ventricular  size is normal. There is normal pulmonary artery systolic pressure.  3. Mild mitral valve leaflet thickening and calcification. Trivial mitral  valve regurgitation.  4. The aortic valve is tricuspid. There is mild thickening of the aortic  valve. Aortic valve regurgitation is not visualized. No aortic stenosis is  present.  5. The inferior vena cava is normal in size with <50% respiratory  variability, suggesting right atrial pressure of 8 mmHg.   CT Head wo  contrast 02/16/2020 IMPRESSION: 1. Continued interval decrease in conspicuity of scattered small volume subarachnoid hemorrhage involving both frontoparietal convexities, now only faintly visible and nearly resolved. 2. Continued interval evolution of bilateral watershed territory infarcts, most pronounced at the left frontoparietal convexity. Overall size and distribution is relatively similar. No hemorrhagic transformation by CT. 3. Persistent but improved hyperdensity about the veins of Trolard since prior CT. Likewise, the remainder of the dural sinuses also appear less dense and engorged as compared to previous. 4. No other new acute intracranial abnormality.  CT Head wo contrast 02/14/2020 IMPRESSION: Small volume bilateral frontoparietal and basilar cistern subarachnoid hemorrhage is unchanged. No new intracranial hemorrhage. Small bilateral cerebral infarcts are not well demonstrated on this exam. Increased conspicuity of left cerebral edema. Hyperdense appearance of the cortical veins, superior sagittal and right transverse sinus is less conspicuous than prior exam.  IR Thrombectomy 02/13/2020 Findings. 1.Extensive thrombus load in the TS bilaterally,the Rt SS ,RT IJV and post one third of the SSS. Large chunks of clot removed with revascularization of the TS bilaterally and partially the RT SS and the torcula. Sig decreased venous congestion of the cortical veins noted post procedure. Post CT brain No ICH or mass effect or hydrocephalus. Clinically stable with pupils 72mm sluggish.  CT Head wo contrast 02/12/2020 IMPRESSION: 1. The study is positive for a small volume of subarachnoid hemorrhage. There is no midline shift. 2. Hyperdense dural sinuses and cortical veins especially on the right in addition to a filling defect within the transverse sinus on the right is concerning for dural venous thrombosis and may be the source of the patient's subarachnoid hemorrhage.  Emergent MRI/MRV is recommended for further evaluation.  MR Brain wo contrast MR Venogram Head 02/12/2020 IMPRESSION: 1. Extensive, acute dural venous sinus thrombosis involving the superior sagittal sinus into the right internal jugular vein via the dominant right transverse and sigmoid system. Both veins of Trolard are thrombosed and there is straight sinus/deep cerebral thrombosis as well. 2. Deep watershed white matter infarcts, small volume subarachnoid hemorrhage, and left more than right frontoparietal cortical swelling due to #1.  MR Brain wo contrast MR Venogram Head 02/15/2020  Brain MRI IMPRESSION: 1. Progressive left perirolandic venous infarction and petechial hemorrhage. 2. New edema in the deep gray nuclei without infarction.  Intracranial MRV: IMPRESSION: 1. Improved flow especially in the deep venous system. Improved flow also seen in the proximal right transverse sinus. 2. Occlusive clot still seen in the superior sagittal, distal right transverse, and right sigmoid dural sinuses. No recanalization of the veins of Trolard.  EEG adult 02/12/2020  ABNORMALITY -Continue slow, generalized IMPRESSION: This study is suggestive of moderate diffuse encephalopathy, nonspecific etiology.  At 1531, patient was noted to have right upper extremity jerking.  Concomitant EEG showed high  amplitude 2 to 3 Hz rhythmic delta slowing in bitemporal region with evolution in morphology and amplitude but without definite evolution in frequency concerning for focal motor seizure.  EEG adult  02/16/2020 IMPRESSION: This study is suggestive of moderate to severe diffuse encephalopathy, nonspecific etiology.  No seizures or epileptiform discharges were seen throughout the recording.  PHYSICAL EXAM GENERAL: Well developed, young african american ladyf  sitting comfortably in bed, NAD Head: Normocephalic and atraumatic. EENT: No OP obstruction, normal conjunctiva.  LUNGS - Normal  respiratory effort on room air CV - No peripheral edema.  ABDOMEN - Soft, non-distended Ext: warm, well perfused.  Neurological:  Mental Status: Alert, awake, oriented to time, person, place and situation.  Follow midline and appendicular commands.  Speech is improving  with some word finding difficulties.  No paraphasic errors.  Good comprehension.  Able to name only 2 animals which can walk on 4 legs. Able to name simple objects.  Difficulty with calculation.  Cranial Nerves: PERRL, Pt is not able to follow finger but observed to move eyes in all direction otherwise. visual fields full, no facial asymmetry, facial sensation intact, hearing intact, tongue/uvula/soft palate midline, normal sternocleidomastoid and trapezius muscle strength. No evidence of tongue atrophy or fibrillations.  Motor: Mild Drift in Rt Upper Extremity, No Drift in LE's. Strength 5/5 in Rt UE,5 /5 in Lt UE, Strength in Rt LE  4/5, Lt LE 4/5. Poor effort observed.  Reflexes- UE -2+  , LE- 2+,  Tone: is normal and bulk is normal. Sensation- Intact to light touch bilaterally. Coordination: FTN intact bilaterally, no ataxia in BLE. Gait- deferred  ASSESSMENT/PLAN Ms. ADALYNNE STEFFENSMEIER is a 35 y.o. female with PMHx of asthma and sickle cell trait presented with altered mental status.  CT head revealed a small volume subarachnoid hemorrhage and hyperdense dural sinuses with filling defect concerning for dural venous thrombosis.  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  Extremely delicate situation, that is why, IR is consulted for thrombectomy and patient is with IR now.   CVST - involving SSS, straight sinus, deep cerebral vein, right TS, SS and IJ as well as b/l veins of Trolard, likely due to dehydration at this point but looking into hypercoagulable state Venous infarcts - Deep watershed white matter infarct, left frontoparietal cortical infarct and small  volume subarachnoid hemorrhage   Code Stroke CT head: positive for a small volume of SAH.    MRI  and MRV 02/12/20 A). Extensive, acute dural venous sinus thrombosis involving the superior sagittal sinus into the right internal jugular vein via the dominant right transverse and sigmoid system. B).Both veins of Trolard are thrombosed and there is straight sinus/deep cerebral thrombosis as well. C). Deep watershed white matter infarcts, small volume subarachnoid hemorrhage, and left more than right frontoparietal cortical swelling due to #1.  IR Thrombectomy: 02/13/2020- Large chunks of clot removed with revascularization of the TS bilaterally and partially the RT SS and the torcula.Sig decreased venous congestion of the cortical veins noted post procedure.  MRI and MRV 02/15/20 - Improved flow especially in the deep venous system. Improved flow also seen in the proximal right transverse sinus. Occlusive clot still seen in the superior sagittal, distal right transverse, and right sigmoid dural sinuses. No recanalization of the veins of Trolard. Progressive left perirolandic venous infarction and petechial hemorrhage. New edema in the deep gray nuclei without infarction.  IR Thrombectomy: 02/15/2020 - complete revascularization of SSS,Straight S,V  of [place] and ICVs ,and RT TS. Sig focal stenosis at the RT TS/SS junction angioplastied with balloon with  improved flow into RT SS.  MRI and MRV 02/18/20: acute/subacute left frontoparietal and right CC hemorrhagic venous infarct, b/l deep watershed white matter infarcts appear similar to the prior MRI. Flow within the SSS has significantly improved since the prior MRV of 02/15/2020. However, there are persistent foci of occlusive or nonocclusive thrombus within SSS, b/l TS, b/l SS, and right IJ. Degree of flow within deep venous system unchanged, veins of Trolard remain to be thrombosed  2D Echo - EF is 55 to 60%.  LDL 108  HgbA1c 4.9   Hypercoagulable panel:  mildly decreased protein S, but may be an acute phase reactant and should be rechecked as an outpatient in 6 to 8 weeks.  Repeat COVID testing: negative on 02/14/20 and on 02/11/20  VTE prophylaxis - Was on IV heparin now on Pradaxa.   No antithrombotic prior to admission, was on IV heparin now changed to Pradaxa 150 mg twice daily. Stop SCD's  On amantadine to facilitate arousal  Therapy recommendations: CIR [employer] not on file, Rehab coordinator will reach out to Development worker, community for medicaid screening)  Disposition: TBD  Cerebral edema   On 3% saline -> NS>D5  MRI 1/23 new edema in the deep gray nuclei without infarction  MRI 1/26: Dominant acute/subacute left frontoparietal hemorrhagic venous infarct not significantly changed from MRI done on 1/23.   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  on keppra 500 bid now (Reduced from 1 g BID today)  Repeat EEG 02/16/20 - moderate to severe diffuse encephalopathy  Continue keppra  Respiratory failure  Intubated on sedation -> extubated 1/25  Tolerating well so far  Hypotension  BP on the low end  Phenylephrine started now off  . On IVF and TF . BP goal 120-160 . Long-term BP goal normotensive  Anemia - acute blood loss  Hgb - 11.0->8.2->8.6->7.2->7.0->8.2->8.0->8.2>9.4>8.9>9.5>9.6  May related to repeated procedure and IV heparin  1 unit PRBC transfusion on 02/16/2020  CBC monitoring  Hyperlipidemia  Home meds: None  LDL 108, goal < 70  On Lipitor 40 mg  Continue statin at discharge  Dysphagia   Off OG 02/17/20  Modified Barium Swallow completed. There was no penetration/aspiration. Recommended advancing diet to regular  solids, thin liquids; continue to crush meds.  Regular diet  Other Stroke Risk Factors  Sickle Cell Trait: No associated risk found due to sickle cell trait.    Other Active Problems  Asthma   Hypokalemia - potassium - 2.9->3.0->3.6->3.1 >3.8->2.8->3.4->3.1-> supplement ->3.3-> supplement>3.3>Supplement 40 meq x2 doses. Will recheck tomorrow.   Leukocytosis - WBC's - 13.3->15.9 (afebrile) >15.2->12.3->10.3 (afebrile)  Mastoiditis -> Unasyn started 02/18/20  Hospital day # 12   02/24/2020 4:41 PM  I have personally obtained history,examined this patient, reviewed notes, independently viewed imaging studies, participated in medical decision making and plan of care.ROS completed by me personally and pertinent positives fully documented  I have made any additions or clarifications directly to the above note. Agree with note above.  Patient continues to improve.  Continue ongoing therapies and Pradaxa for anticoagulation.  Medically stable to be transferred to inpatient rehab over the next few days when bed available.  Greater than 50% time during this 25-minute visit was spent on counseling and coordination of care and discussion with care team.  Antony Contras, MD Medical Director [place] Pager: [phone] 02/24/2020 5:05 PM      To contact Stroke Continuity provider, please refer to [email or web address] After hours, contact General Neurology

## 2020-02-24 NOTE — Progress Notes (Signed)
Occupational Therapy Treatment Patient Details Name: Jill Clarke MRN: [id number] DOB: Nov 30, 1985 Today's Date: 02/24/2020    History of present illness 35 y.o. female with a history significant for asthma and sickle cell trait presented to ED 1/19 for N/V and discharged home. Returned 1/20 due to AMS. CT Head revealing a small volume subarachnoid hemorrhage and hyperdense dural sinuses with a filling defect concerning for dural venous thrombosis. 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 Intubated 1/21-1/25.   OT comments  Pt continues to present with decreased cognition, motor planning, balance, and functional use of RUE. Pt requiring Min-Mod A for functional mobility. Pt requiring Max cues for sequencing and performing oral care at sink; Min A for standing balance. Continue to recommend dc to CIR and will continue to follow acutely as admitted.    Follow Up Recommendations  CIR    Equipment Recommendations  Wheelchair (measurements OT);Wheelchair cushion (measurements OT);Hospital bed;Other (comment);3 in 1 bedside commode    Recommendations for Other Services Rehab consult    Precautions / Restrictions Precautions Precautions: Other (comment) Precaution Comments: Fell on 1/31 when attmepting to get OOB alone       Mobility Bed Mobility               General bed mobility comments: Sitting in recliner upon arrival  Transfers Overall transfer level: Needs assistance Equipment used: 2 person hand held assist Transfers: Sit to/from Stand Sit to Stand: Min assist;+2 safety/equipment         General transfer comment: Min A for gaining balance in standing    Balance Overall balance assessment: Needs assistance Sitting-balance support: No upper extremity supported;Feet supported Sitting balance-Leahy Scale: Fair     Standing balance support: No upper extremity supported;During functional  activity;Single extremity supported;Bilateral upper extremity supported Standing balance-Leahy Scale: Poor Standing balance comment: Reliant on UE support and/or physical A                           ADL either performed or assessed with clinical judgement   ADL Overall ADL's : Needs assistance/impaired     Grooming: Oral care;Minimal assistance;Standing;Cueing for sequencing;Maximal assistance Grooming Details (indicate cue type and reason): Max cues for sequencing each step. Hand over hand to maintain grasp of RUE on tooth paste                 Toilet Transfer: Minimal assistance;Ambulation (simulated to recliner) Toilet Transfer Details (indicate cue type and reason): Min A for balance in standing         Functional mobility during ADLs: Minimal assistance;+2 for safety/equipment;+2 for physical assistance General ADL Comments: Pt contineus to present with poor balance, strengthm cognition, and functional use of RUE.     Vision       Perception     Praxis      Cognition Arousal/Alertness: Awake/alert Behavior During Therapy: Impulsive;Flat affect Overall Cognitive Status: Impaired/Different from baseline Area of Impairment: Orientation;Attention;Memory;Following commands;Safety/judgement;Awareness;Problem solving                 Orientation Level: Disoriented to;Time (Using external clues for stating month and day of week) Current Attention Level: Sustained;Focused (Very short sustained attention. Easily distracted both internally and externally.) Memory: Decreased short-term memory Following Commands: Follows one step commands inconsistently;Follows one step commands with increased time Safety/Judgement: Decreased awareness of deficits;Decreased awareness of safety Awareness: Intellectual Problem Solving: Slow processing;Decreased initiation;Difficulty sequencing;Requires verbal cues;Requires  tactile cues General Comments: Prior to therapy  arrival, pt peeing in trash can and setting off arm alarm. Pt highly distracted by external stimuli and then requiring continued cues to complete task or follow commands. During grooming, pt requiring increased cues to initiate each step and then sequence. Pt forgetting to use toothpaste and required questioning cues for recalling to use paste and then locating toothpaste        Exercises     Shoulder Instructions       General Comments      Pertinent Vitals/ Pain       Pain Assessment: No/denies pain  Home Living Family/patient expects to be discharged to:: Unsure                                        Prior Functioning/Environment              Frequency  Min 2X/week        Progress Toward Goals  OT Goals(current goals can now be found in the care plan section)  Progress towards OT goals: Progressing toward goals  Acute Rehab OT Goals Patient Stated Goal: mickey mouse with kids OT Goal Formulation: Patient unable to participate in goal setting Potential to Achieve Goals: Fair ADL Goals Additional ADL Goal #1: pt will tolerate eob sitting 5 minutes with stable VSS Additional ADL Goal #2: Pt will complete 2 step command 50% of attempts Additional ADL Goal #3: pt will demonstrate sustained attention for adl  Plan Discharge plan needs to be updated    Co-evaluation    PT/OT/SLP Co-Evaluation/Treatment: Yes Reason for Co-Treatment: For patient/therapist safety;To address functional/ADL transfers   OT goals addressed during session: ADL's and self-care      AM-PAC OT "6 Clicks" Daily Activity     Outcome Measure   Help from another person eating meals?: A Lot Help from another person taking care of personal grooming?: A Lot Help from another person toileting, which includes using toliet, bedpan, or urinal?: A Lot Help from another person bathing (including washing, rinsing, drying)?: A Lot Help from another person to put on and taking off  regular upper body clothing?: A Lot Help from another person to put on and taking off regular lower body clothing?: A Lot 6 Click Score: 12    End of Session Equipment Utilized During Treatment: Gait belt;Rolling walker  OT Visit Diagnosis: Unsteadiness on feet (R26.81);Muscle weakness (generalized) (M62.81)   Activity Tolerance Patient tolerated treatment well   Patient Left in chair;with call bell/phone within reach;with chair alarm set   Nurse Communication Mobility status        Time: 8657-8469 OT Time Calculation (min): 29 min  Charges: OT General Charges $OT Visit: 1 Visit OT Treatments $Self Care/Home Management : 8-22 mins  [place], OTR/L Acute Rehab Pager: [phone] Office: [place] 02/24/2020, 3:42 PM

## 2020-02-24 NOTE — Progress Notes (Signed)
Inpatient Rehab Admissions Coordinator:   I met with Pt. In room for ongoing discussion regarding potential CIR admission. I have been unable to locate a valid insurance policy for this Pt. She states she has a Actuary with her employer, [name], but her mother called yesterday and was told that there was no policy was on file with them. Discussed self-pay option for CIR and pt. Is amenable to this. I will reach out to financial counselor to have pt. Screened for medicaid as well.  Clemens Catholic, [place], [place] Admissions Coordinator  [phone] ([place]) [phone] (office)

## 2020-02-24 NOTE — Progress Notes (Signed)
Pt. Was assessed A&O x4, no abrasions, no changes in status from baseline. Family was notified and Dr. Was paged.   02/24/20 0100  Vitals  Temp 98.4 F (36.9 C)  Temp Source Oral  BP 138/74  MAP (mmHg) 95  BP Location Left Arm  BP Method Automatic  Patient Position (if appropriate) Lying  Pulse Rate 86  Pulse Rate Source [place]am[place]  Level of Consciousness  Level of Consciousness Alert  MEWS COLOR  MEWS Score Color Green  Oxygen Therapy  SpO2 100 %  O2 Device Room Air  Pain Assessment  Pain Scale 0-10  Pain Score 0  Critical Care Pain Observation Tool (CPOT)  Facial Expression 0  Body Movements 0  Muscle Tension 0  Compliance with ventilator (intubated pts.) N/A  Complaints & Interventions  Neuro symptoms relieved by Rest  PCA/Epidural/Spinal Assessment  Respiratory Pattern Regular;Unlabored  ECG Monitoring  Cardiac Rhythm NSR  New onset of dysrhythmia? No  Glasgow Coma Scale  Eye Opening 4  Best Verbal Response (NON-intubated) 4  Best Motor Response 6  Glasgow Coma Scale Score 14  MEWS Score  MEWS Temp 0  MEWS Systolic 0  MEWS Pulse 0  MEWS RR 0  MEWS LOC 0  MEWS Score 0  Provider Notification  Provider Name/Title Bartholome Bill  Date Provider Notified 02/24/20  Time Provider Notified 0140  Notification Type Page  Notification Reason Other (Comment)  Response Other (Comment) (No response)  Note  Observations  (Pt. had no deficits related to fall.)

## 2020-02-24 NOTE — Progress Notes (Signed)
Modified Barium Swallow Progress Note  Patient Details  Name: Jill Clarke MRN: [id number] Date of Birth: August 16, 1985  Today's Date: 02/24/2020  Modified Barium Swallow completed.  Full report located under Chart Review in the Imaging Section.  Brief recommendations include the following:  Clinical Impression  Pt presented with mild oral dysphagia marked by piecemeal bolusing of solids into the pharynx.  Once bolus reached the valleculae/pyriforms, pharyngeal swallow was initiated with reliable laryngeal vestibule closure. There was no penetration/aspiration, even when taxed with large, successive boluses of thin and mixed thin/solid consistencies.  Pt had diffulty manipulating barium pill and transferring to posterior tongue - pill was manually removed.  Recommend advancing diet to regular solids, thin liquids; continue to crush meds.  Pt was excited about results of test.   Swallow Evaluation Recommendations       SLP Diet Recommendations: Regular solids;Thin liquid   Liquid Administration via: Cup;Straw   Medication Administration: Crushed with puree   Supervision: Patient able to self feed;Staff to assist with self feeding   Compensations: Minimize environmental distractions;Monitor for anterior loss   Postural Changes: Seated upright at 90 degrees   Oral Care Recommendations: Oral care BID       Yianni Skilling L. Tivis Ringer, [place] Office number [phone] Pager [phone]  [name] 02/24/2020,11:13 AM

## 2020-02-25 LAB — GLUCOSE, CAPILLARY
Glucose-Capillary: 106 mg/dL — ABNORMAL HIGH (ref 70–99)
Glucose-Capillary: 129 mg/dL — ABNORMAL HIGH (ref 70–99)
Glucose-Capillary: 131 mg/dL — ABNORMAL HIGH (ref 70–99)
Glucose-Capillary: 151 mg/dL — ABNORMAL HIGH (ref 70–99)
Glucose-Capillary: 161 mg/dL — ABNORMAL HIGH (ref 70–99)
Glucose-Capillary: 99 mg/dL (ref 70–99)

## 2020-02-25 LAB — BASIC METABOLIC PANEL
Anion gap: 6 (ref 5–15)
BUN: <5 mg/dL — ABNORMAL LOW (ref 6–20)
CO2: 26 mmol/L (ref 22–32)
Calcium: 8.7 mg/dL — ABNORMAL LOW (ref 8.9–10.3)
Chloride: 107 mmol/L (ref 98–111)
Creatinine, Ser: 0.39 mg/dL — ABNORMAL LOW (ref 0.44–1.00)
GFR, Estimated: >60 mL/min (ref >60)
Glucose, Bld: 124 mg/dL — ABNORMAL HIGH (ref 70–99)
Potassium: 3.2 mmol/L — ABNORMAL LOW (ref 3.5–5.1)
Sodium: 139 mmol/L (ref 135–145)

## 2020-02-25 MED ORDER — POTASSIUM CHLORIDE 20 MEQ PO PACK
40.0000 meq | PACK | Freq: Once | ORAL | Status: AC
  Administered 2020-02-25: 40 meq via ORAL
  Filled 2020-02-25: qty 2

## 2020-02-25 NOTE — Progress Notes (Signed)
STROKE TEAM PROGRESS NOTE  INTERVAL HISTORY No overnight events.  Patient is evaluated at bedside this morning.  No family present in the room.  She is alert, active, cooperative with exam.  Patient is eating regular food without any complaint.  She is on Pradaxa.  Barium study done yesterday. On regular diet now. She is afebrile. Vitals stable.  Neuro exam unchanged, still have mild right hand and right leg weakness.  Hematocrit stable. K still low at 3.2.  We will replete with 40 mEq x2.  Patient is medically stable for rehab.   Vitals:   02/25/20 0345 02/25/20 0500 02/25/20 0724 02/25/20 1112  BP: 100/76  106/77 106/74  Pulse: 81  87 98  Resp: 17  18 18   Temp: 97.7 F (36.5 C)  97.9 F (36.6 C) 98.4 F (36.9 C)  TempSrc: Oral  Oral Oral  SpO2: 100%  100% 100%  Weight:  71.7 kg    Height:       CBC:  Recent Labs  Lab 02/22/20 0041 02/23/20 0450  WBC 10.2 10.3  HGB 9.5* 9.6*  HCT 29.9* 29.5*  MCV 92.6 91.9  PLT 470* [id number]*   Basic Metabolic Panel:  Recent Labs  Lab 02/21/20 0122 02/22/20 0041 02/24/20 0500 02/25/20 0500  NA 137   < > 141 139  K 3.1*   < > 3.3* 3.2*  CL 106   < > 111 107  CO2 25   < > 22 26  GLUCOSE 129*   < > 124* 124*  BUN <5*   < > <5* <5*  CREATININE 0.37*   < > 0.39* 0.39*  CALCIUM 8.9   < > 8.7* 8.7*  MG 2.0  --   --   --    < > = values in this interval not displayed.   Lipid Panel:  No results for input(s): CHOL, TRIG, HDL, CHOLHDL, VLDL, LDLCALC in the last 168 hours.  IMAGING past 24 hours  MR Brain 02/18/2020 1. Motion degraded exam. 2. 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 3. Restricted diffusion at site of an acute/subacute hemorrhagic infarct within the right callosal splenium has subtly increased in extent. 4. T2/FLAIR hyperintensity within the deep gray nuclei persists but has diminished. However, there may be a new punctate acute  infarct within the left thalamocapsular junction. 5. 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 6. Persistent trace subarachnoid hemorrhage along the left frontoparietal convexity.  MRV Head 02/18/2020 1. Flow within the superior sagittal sinus has significantly improved since the prior MRV of 02/15/2020. However, there are persistent foci of nonocclusive thrombus within this vessel. 2. Nonocclusive thrombus within the left transverse and proximal sigmoid dural venous sinuses has increased. 3. Persistent nonocclusive thrombus within the right transverse sinus. 4. Persistent near occlusive or occlusive thrombus within the right sigmoid sinus and visualized right internal jugular vein. 5. The degree of flow within the deep venous system is unchanged. 6. The veins of Trolard and a few additional cerebral veins overlying the bilateral frontoparietal convexities remain thrombosed.  Echocardiogram 02/16/2020 IMPRESSIONS  1. Left ventricular ejection fraction, by estimation, is 55 to 60%. The  left ventricle has normal function. The left ventricle has no regional  wall motion abnormalities. Left ventricular diastolic parameters were  normal.  2. Right ventricular systolic function is normal. The right ventricular  size is normal. There is normal pulmonary artery systolic  pressure.  3. Mild mitral valve leaflet thickening and calcification. Trivial mitral  valve regurgitation.  4. The aortic valve is tricuspid. There is mild thickening of the aortic  valve. Aortic valve regurgitation is not visualized. No aortic stenosis is  present.  5. The inferior vena cava is normal in size with <50% respiratory  variability, suggesting right atrial pressure of 8 mmHg.   CT Head wo contrast 02/16/2020 IMPRESSION: 1. Continued interval decrease in conspicuity of scattered small volume subarachnoid hemorrhage involving both  frontoparietal convexities, now only faintly visible and nearly resolved. 2. Continued interval evolution of bilateral watershed territory infarcts, most pronounced at the left frontoparietal convexity. Overall size and distribution is relatively similar. No hemorrhagic transformation by CT. 3. Persistent but improved hyperdensity about the veins of Trolard since prior CT. Likewise, the remainder of the dural sinuses also appear less dense and engorged as compared to previous. 4. No other new acute intracranial abnormality.  CT Head wo contrast 02/14/2020 IMPRESSION: Small volume bilateral frontoparietal and basilar cistern subarachnoid hemorrhage is unchanged. No new intracranial hemorrhage. Small bilateral cerebral infarcts are not well demonstrated on this exam. Increased conspicuity of left cerebral edema. Hyperdense appearance of the cortical veins, superior sagittal and right transverse sinus is less conspicuous than prior exam.  IR Thrombectomy 02/13/2020 Findings. 1.Extensive thrombus load in the TS bilaterally,the Rt SS ,RT IJV and post one third of the SSS. Large chunks of clot removed with revascularization of the TS bilaterally and partially the RT SS and the torcula. Sig decreased venous congestion of the cortical veins noted post procedure. Post CT brain No ICH or mass effect or hydrocephalus. Clinically stable with pupils 61mm sluggish.  CT Head wo contrast 02/12/2020 IMPRESSION: 1. The study is positive for a small volume of subarachnoid hemorrhage. There is no midline shift. 2. Hyperdense dural sinuses and cortical veins especially on the right in addition to a filling defect within the transverse sinus on the right is concerning for dural venous thrombosis and may be the source of the patient's subarachnoid hemorrhage. Emergent MRI/MRV is recommended for further evaluation.  MR Brain wo contrast MR Venogram Head 02/12/2020 IMPRESSION: 1. Extensive, acute  dural venous sinus thrombosis involving the superior sagittal sinus into the right internal jugular vein via the dominant right transverse and sigmoid system. Both veins of Trolard are thrombosed and there is straight sinus/deep cerebral thrombosis as well. 2. Deep watershed white matter infarcts, small volume subarachnoid hemorrhage, and left more than right frontoparietal cortical swelling due to #1.  MR Brain wo contrast MR Venogram Head 02/15/2020  Brain MRI IMPRESSION: 1. Progressive left perirolandic venous infarction and petechial hemorrhage. 2. New edema in the deep gray nuclei without infarction.  Intracranial MRV: IMPRESSION: 1. Improved flow especially in the deep venous system. Improved flow also seen in the proximal right transverse sinus. 2. Occlusive clot still seen in the superior sagittal, distal right transverse, and right sigmoid dural sinuses. No recanalization of the veins of Trolard.  EEG adult 02/12/2020  ABNORMALITY -Continue slow, generalized IMPRESSION: This study is suggestive of moderate diffuse encephalopathy, nonspecific etiology.  At 1531, patient was noted to have right upper extremity jerking.  Concomitant EEG showed high amplitude 2 to 3 Hz rhythmic delta slowing in bitemporal region with evolution in morphology and amplitude but without definite evolution in frequency concerning for focal motor seizure.  EEG adult  02/16/2020 IMPRESSION: This study is suggestive of moderate to severe diffuse encephalopathy, nonspecific etiology.  No seizures or epileptiform discharges  were seen throughout the recording.  PHYSICAL EXAM GENERAL: Well developed, young african american lady  sitting comfortably in bed, NAD Head: Normocephalic and atraumatic. EENT: No OP obstruction, normal conjunctiva.  LUNGS - Normal respiratory effort on room air CV - No peripheral edema.  ABDOMEN - Soft, non-distended Ext: warm, well perfused.  Neurological:  Mental  Status: Alert, awake, oriented to time, person, place and situation.  Follow midline and appendicular commands.  Speech is improving  with some word finding difficulties.  No paraphasic errors.  Good comprehension.   Cranial Nerves: PERRL, Pt is not able to follow finger but observed to move eyes in all direction otherwise. visual fields full, no facial asymmetry, facial sensation intact, hearing intact, tongue/uvula/soft palate midline, normal sternocleidomastoid and trapezius muscle strength. No evidence of tongue atrophy or fibrillations.  Motor: Mild Drift in Rt Upper Extremity, No Drift in LE's. Strength 4+/5 in Rt UE, mild weakness in RT hand demonstrated on rapid tapping movements, 5 /5 in Lt UE, Strength in Rt LE  4/5, Lt LE 4/5. Poor effort observed.  Reflexes- UE -2+  , LE- 2+,  Tone: is normal and bulk is normal. Sensation- Intact to light touch bilaterally. Coordination: FTN intact bilaterally, no ataxia in BLE. Gait- deferred  ASSESSMENT/PLAN Jill Clarke is a 35 y.o. female with PMHx of asthma and sickle cell trait presented with altered mental status.  CT head revealed a small volume subarachnoid hemorrhage and hyperdense dural sinuses with filling defect concerning for dural venous thrombosis.  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  Extremely delicate situation, that is why, IR is consulted for thrombectomy and patient is with IR now. Pt is medically stable for Rehab.  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    MRI  and MRV 02/12/20 A). Extensive, acute dural venous sinus  thrombosis involving the superior sagittal sinus into the right internal jugular vein via the dominant right transverse and sigmoid system. B).Both veins of Trolard are thrombosed and there is straight sinus/deep cerebral thrombosis as well. C). Deep watershed white matter infarcts, small volume subarachnoid hemorrhage, and left more than right frontoparietal cortical swelling due to #1.  IR Thrombectomy: 02/13/2020- Large chunks of clot removed with revascularization of the TS bilaterally and partially the RT SS and the torcula.Sig decreased venous congestion of the cortical veins noted post procedure.  MRI and MRV 02/15/20 - Improved flow especially in the deep venous system. Improved flow also seen in the proximal right transverse sinus. Occlusive clot still seen in the superior sagittal, distal right transverse, and right sigmoid dural sinuses. No recanalization of the veins of Trolard. Progressive left perirolandic venous infarction and petechial hemorrhage. New edema in the deep gray nuclei without infarction.  IR Thrombectomy: 02/15/2020 - complete revascularization of SSS,Straight S,V of [place] and ICVs ,and RT TS. Sig focal stenosis at the RT TS/SS junction angioplastied with balloon with  improved flow into RT SS.  MRI and MRV 02/18/20: acute/subacute left frontoparietal and right CC hemorrhagic venous infarct, b/l deep watershed white matter infarcts appear similar to the prior MRI. Flow within the SSS has significantly  improved since the prior MRV of 02/15/2020. However, there are persistent foci of occlusive or nonocclusive thrombus within SSS, b/l TS, b/l SS, and right IJ. Degree of flow within deep venous system unchanged, veins of Trolard remain to be thrombosed  2D Echo - EF is 55 to 60%.  LDL 108  HgbA1c 4.9   Hypercoagulable panel: mildly decreased protein S, but may be an acute phase reactant and should be rechecked as an outpatient in 6 to 8 weeks.  Repeat COVID testing: negative on  02/14/20 and on 02/11/20  VTE prophylaxis - Was on IV heparin now on Pradaxa.   No antithrombotic prior to admission, was on IV heparin now changed to Pradaxa 150 mg twice daily. Stop SCD's  On amantadine to facilitate arousal  Therapy recommendations: CIR [employer] not on file, Rehab coordinator will reach out to Development worker, community for medicaid screening)  Disposition: TBD  Cerebral edema   On 3% saline -> NS>D5  MRI 1/23 new edema in the deep gray nuclei without infarction  MRI 1/26: Dominant acute/subacute left frontoparietal hemorrhagic venous infarct not significantly changed from MRI done on 1/23.   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  on keppra 500 bid now.  Repeat EEG 02/16/20 - moderate to severe diffuse encephalopathy  Continue keppra  Respiratory failure  Intubated on sedation -> extubated 1/25  Tolerating well so far  Hypotension  BP stable on the low end  Phenylephrine started now off  . On IVF and TF . BP goal 120-160 . Long-term BP goal normotensive  Anemia - acute blood loss  Hgb - 11.0->8.2->8.6->7.2->7.0->8.2->8.0->8.2>9.4>8.9>9.5>9.6.   May related to repeated procedure and IV heparin  1 unit PRBC transfusion on 02/16/2020  CBC monitoring  Hyperlipidemia  Home meds: None  LDL 108, goal < 70  On Lipitor 40 mg  Continue statin at discharge  Dysphagia   Off OG 02/17/20  Modified Barium Swallow - There was no penetration/aspiration. Recommended advancing diet to regular solids, thin liquids; continue to crush meds.  Regular diet  Other Stroke Risk Factors  Sickle Cell Trait: No associated risk found due to sickle cell trait.    Other  Active Problems  Asthma   Hypokalemia - potassium - 2.9->3.0->3.6->3.1 >3.8->2.8->3.4->3.1-> supplement ->3.3-> supplement>3.3>supplement -> 3.2 Supplement 40 meq x2 doses (one dose given in the morning, She will get one more dose at 3 pm . Will recheck BMP tomorrow.   Leukocytosis - WBC's - 13.3->15.9 (afebrile) >15.2->12.3->10.3 (afebrile)  Mastoiditis -> Unasyn started 02/18/20  Hospital day # 13  I have personally obtained history,examined this patient, reviewed notes, independently viewed imaging studies, participated in medical decision making and plan of care.ROS completed by me personally and pertinent positives fully documented  I have made any additions or clarifications directly to the above note. Agree with note above.  Replace potassium. Await rehab bed when available  Antony Contras, MD Medical Director [place] Pager: [phone] 02/25/2020 1:44 PM       To contact Stroke Continuity provider, please refer to [email or web address] After hours, contact General Neurology

## 2020-02-25 NOTE — Progress Notes (Signed)
Occupational Therapy Treatment Patient Details Name: Jill Clarke MRN: [id number] DOB: [birth year] Today's Date: 02/25/2020    History of present illness 35 y.o. female with a history significant for asthma and sickle cell trait presented to ED 1/19 for N/V and discharged home. Returned 1/20 due to AMS. CT Head revealing a small volume subarachnoid hemorrhage and hyperdense dural sinuses with a filling defect concerning for dural venous thrombosis. 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 Intubated 1/21-1/25.   OT comments  Pt making steady progress towards OT goals this session. Session focus on BADL reeducation and functional mobility. Pt continues to present with impulsivity, impaired balance, impaired RUE strength and FMC, and decreased activity tolerance. Pt requires MOD A for toilet transfer to [facility], and min guard for UB/LB bathing from standing. Pt liable throughout session becoming tearful about progress she has already made. Pt would continue to benefit from skilled occupational therapy while admitted and after d/c to address the below listed limitations in order to improve overall functional mobility and facilitate independence with BADL participation. DC plan remains appropriate, will follow acutely per POC.     Follow Up Recommendations  CIR    Equipment Recommendations  Wheelchair (measurements OT);Wheelchair cushion (measurements OT);Hospital bed;Other (comment);3 in 1 bedside commode    Recommendations for Other Services      Precautions / Restrictions Precautions Precautions: Other (comment) Precaution Comments: fall on 1/31 attempting to get OOB alone Restrictions Weight Bearing Restrictions: No       Mobility Bed Mobility Overal bed mobility: Needs Assistance Bed Mobility: Supine to Sit     Supine to sit: Min assist     General bed mobility comments: pt OOB in recliner and returned to  recliner at end of session  Transfers Overall transfer level: Needs assistance Equipment used: None Transfers: Sit to/from [facility] Sit to Stand: Min assist;Min guard Stand pivot transfers: Mod assist       General transfer comment: MOD A for stand pivot for balance and cues for hand placement as pt impulsively attempting to sit prematurely. min A to stand initially from recliner however progressing to min guard from for bathing task    Balance Overall balance assessment: Needs assistance Sitting-balance support: No upper extremity supported;Feet supported Sitting balance-Leahy Scale: Fair     Standing balance support: During functional activity;Single extremity supported Standing balance-Leahy Scale: Poor Standing balance comment: at least one UE supported during bathing tasks at sink                           ADL either performed or assessed with clinical judgement   ADL Overall ADL's : Needs assistance/impaired     Grooming: Wash/dry face;Sitting;Supervision/safety;Set up Grooming Details (indicate cue type and reason): from sitting in recliner in front of sink Upper Body Bathing: Supervision/ safety;Set up;Min guard;Standing Upper Body Bathing Details (indicate cue type and reason): min guard for dynamic standing tasks Lower Body Bathing: Min guard;Sit to/from stand Lower Body Bathing Details (indicate cue type and reason): minguard for dynamic LB bathing tasks with RUE supported on sink Upper Body Dressing : Minimal assistance;Sitting Upper Body Dressing Details (indicate cue type and reason): to don new gown     Toilet Transfer: Minimal assistance;Stand-pivot;BSC Toilet Transfer Details (indicate cue type and reason): MIN for balance and safety as pt attempting to sit prematurely Toileting- Clothing Manipulation and Hygiene: Supervision/safety;Set up;Min guard;Sit to/from stand Toileting -  Clothing Manipulation Details (indicate cue type  and reason): min guard for balance in standing     Functional mobility during ADLs: Minimal assistance;Cueing for safety General ADL Comments: pt continues to present with impaired balance, impaired RUE strength and FMC, decreased activity tolerance     Vision   Additional Comments: R inattention   Perception     Praxis      Cognition Arousal/Alertness: Awake/alert Behavior During Therapy: Impulsive Overall Cognitive Status: Impaired/Different from baseline Area of Impairment: Safety/judgement;Awareness                   Current Attention Level: Sustained Memory: Decreased recall of precautions;Decreased short-term memory Following Commands: Follows one step commands consistently Safety/Judgement: Decreased awareness of safety;Decreased awareness of deficits Awareness: Intellectual Problem Solving: Slow processing;Difficulty sequencing General Comments: pt continues to be impulsive attempting to get out of recliner with legs still elevated, pt very liable during session. pt starting to process all that she has been through and very grateful for abilty to be able to walk today with PT and wash her self with COTA. issued pt paper and pen as pt wants to try to process emotions through writing. encouraged pt to work on gratitude list        Exercises     Shoulder Instructions       General Comments HR max 13- bpm with standing grooming tasks    Pertinent Vitals/ Pain       Pain Assessment: No/denies pain  Home Living                                          Prior Functioning/Environment              Frequency  Min 2X/week        Progress Toward Goals  OT Goals(current goals can now be found in the care plan section)  Progress towards OT goals: Progressing toward goals  Acute Rehab OT Goals Patient Stated Goal: to return to independent mobility  Plan Discharge plan remains appropriate;Frequency remains appropriate     Co-evaluation                 AM-PAC OT "6 Clicks" Daily Activity     Outcome Measure   Help from another person eating meals?: A Little Help from another person taking care of personal grooming?: A Little Help from another person toileting, which includes using toliet, bedpan, or urinal?: A Little Help from another person bathing (including washing, rinsing, drying)?: A Little Help from another person to put on and taking off regular upper body clothing?: A Little Help from another person to put on and taking off regular lower body clothing?: A Lot 6 Click Score: 17    End of Session Equipment Utilized During Treatment: Rolling walker  OT Visit Diagnosis: Unsteadiness on feet (R26.81);Muscle weakness (generalized) (M62.81)   Activity Tolerance Patient tolerated treatment well   Patient Left in chair;with call bell/phone within reach;with chair alarm set   Nurse Communication Mobility status;Other (comment) (BM and washed up)        Time: 1610-9604 OT Time Calculation (min): 42 min  Charges: OT General Charges $OT Visit: 1 Visit OT Treatments $Self Care/Home Management : 38-52 mins  Harley Alto., COTA/L Acute Rehabilitation Services [phone]    Jill Clarke 02/25/2020, 12:28 PM

## 2020-02-25 NOTE — Progress Notes (Signed)
Inpatient Rehab Admissions Coordinator:   I was able to locate a policy and open a a case with [facility]. I have received insurance authorization for CIR admit. I do not currently have a bed for her but will follow for potential admit pending bed availability.   Clemens Catholic, [place], [place] Admissions Coordinator  [phone] ([place]) [phone] (office)

## 2020-02-25 NOTE — Progress Notes (Signed)
Nutrition Follow-up  DOCUMENTATION CODES:   Not applicable  INTERVENTION:   -continue Ensure Enlive BID, each supplement provides 350 kcal, 20 g protein  -snacks BID   -continue monitor po intake  NUTRITION DIAGNOSIS:   Inadequate oral intake related to acute illness as evidenced by meal completion < 50%.  Progressing.  GOAL:   Patient will meet greater than or equal to 90% of their needs  Progressing.  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  Intern discussed pt's appetite while at the hospital and pt indicated that she was excited for her diet advancement. She mentioned that she had breakfast this morning, but could not recall what she had or how much she consumed.   Pt mentioned that she sometimes still feels hungry, therefore intern discussed adding extra snacks throughout the day. Intern also discussed with pt the Ensure Enlive she had been receiving. Pt told intern she was enjoying these and knew the benefits these had with the extra protein and kcals they provided to aid in her healing.   1/25 extubated  1/26 failed swallow  1/28 diet advanced to Dysphagia 1 with thin liquids 02/01 MBS completed, SLP recommends regular diet with thin liquids  Intern asked pt about UBW and pt mentioned that her UBW was 180#. Pt did indicate that she felt she had lost some weight during her hospital stay. Pt's weight has steadily increased (13%) since 01/28. However, prior pt had lost 12% of body weight between 01/26 and 01/28. It's likely these weights may not be accurate due to the drastic changes.  Meds reviewed: Colace (100 mg, BID), Ensure Enlive BID, Miralax (17 g, daily),  Klor-Con (40 mEq, once)   Labs reviewed: Potassium (3.2 mmol/L), BUN (<5 mg/dL), Creatinine (0.39 mg/dL), Calcium (8.7 mg/dL)  Diet Order:   Diet Order            Diet regular Room service appropriate? Yes; Fluid consistency: Thin  Diet effective now                 EDUCATION NEEDS:   No education needs have been identified at this time  Skin:  Skin Assessment: Reviewed RN Assessment  Last BM:  02/01 (type 6)  Height:   Ht Readings from Last 1 Encounters:  02/13/20 5\' 5"  (1.651 m)    Weight:   Wt Readings from Last 1 Encounters:  02/25/20 71.7 kg    Ideal Body Weight:  56.8 kg  BMI:  Body mass index is 26.29 kg/m.  Estimated Nutritional Needs:   Kcal:  1850-2050  Protein:  85-100 g  Fluid:  >/=1.85 L/day    Jill Clarke, Dietetic Intern 02/25/2020 2:47 PM

## 2020-02-25 NOTE — Progress Notes (Signed)
Physical Therapy Treatment Patient Details Name: Jill Clarke MRN: [id number] DOB: December 08, 1985 Today's Date: 02/25/2020    History of Present Illness 35 y.o. female with a history significant for asthma and sickle cell trait presented to ED 1/19 for N/V and discharged home. Returned 1/20 due to AMS. CT Head revealing a small volume subarachnoid hemorrhage and hyperdense dural sinuses with a filling defect concerning for dural venous thrombosis. 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 Intubated 1/21-1/25.    PT Comments    Pt tolerates treatment well but continues to demonstrates reduced attention, some R inattention, and impaired motor planning (see bed mobility). Pt experiences multiple losses of balance when ambulating in hallway and bumps into multiple objects on R side. Pt with impaired short term memory, quickly forgetting PT cues to attend to R side when mobilizing. Pt will continue to benefit from acute PT POC to improve mobility quality and to reduce falls risk. PT continues to recommend CIR placement at this time.   Follow Up Recommendations  CIR     Equipment Recommendations  Rolling walker with 5" wheels    Recommendations for Other Services       Precautions / Restrictions Precautions Precautions: Other (comment) Precaution Comments: fall on 1/31 attempting to get OOB alone Restrictions Weight Bearing Restrictions: No    Mobility  Bed Mobility Overal bed mobility: Needs Assistance Bed Mobility: Supine to Sit     Supine to sit: Min assist     General bed mobility comments: pt with motor planning deficits, transitions from supine directly to standing. Has difficulty attempting to get LLE off bed. Pt places R foot on floor easily but has difficulty planning to get L foot off bed. Almost attempts to stand up on bed prior to PT cues for safety. Pt then turns 180 degrees and transitions from supine to  standing  Transfers Overall transfer level: Needs assistance Equipment used: None Transfers: Sit to/from [facility] Sit to Stand: Min assist Stand pivot transfers: Mod assist       General transfer comment: pt with lateral LOB with SPT  Ambulation/Gait Ambulation/Gait assistance: Min assist Gait Distance (Feet): 150 Feet Assistive device: None Gait Pattern/deviations: Step-through pattern Gait velocity: reduced Gait velocity interpretation: <1.8 ft/sec, indicate of risk for recurrent falls General Gait Details: pt with R inattention, bumping into multiple objects on R side. Pt with posterior LOB with one backwards step which she is unable to correct for without UE support   Stairs Stairs: Yes Stairs assistance: Mod assist Stair Management: One rail Right;Step to pattern Number of Stairs: 2 General stair comments: pt with posterior LOB immediately prior to starting stairs, then another posterior LOB after descending 2nd step   Wheelchair Mobility    Modified Rankin (Stroke Patients Only) Modified Rankin (Stroke Patients Only) Pre-Morbid Rankin Score: No symptoms Modified Rankin: Moderately severe disability     Balance Overall balance assessment: Needs assistance Sitting-balance support: No upper extremity supported;Feet supported Sitting balance-Leahy Scale: Fair     Standing balance support: No upper extremity supported Standing balance-Leahy Scale: Poor Standing balance comment: minA without UE support                            Cognition Arousal/Alertness: Awake/alert Behavior During Therapy: Impulsive Overall Cognitive Status: Impaired/Different from baseline Area of Impairment: Attention;Memory;Following commands;Safety/judgement;Problem solving;Awareness  Current Attention Level: Sustained Memory: Decreased recall of precautions;Decreased short-term memory Following Commands: Follows one step  commands consistently Safety/Judgement: Decreased awareness of safety;Decreased awareness of deficits Awareness: Intellectual Problem Solving: Slow processing;Difficulty sequencing        Exercises      General Comments General comments (skin integrity, edema, etc.): VSS on RA      Pertinent Vitals/Pain Pain Assessment: No/denies pain    Home Living                      Prior Function            PT Goals (current goals can now be found in the care plan section) Acute Rehab PT Goals Patient Stated Goal: to return to independent mobility Progress towards PT goals: Progressing toward goals    Frequency    Min 4X/week      PT Plan Current plan remains appropriate    Co-evaluation              AM-PAC PT "6 Clicks" Mobility   Outcome Measure  Help needed turning from your back to your side while in a flat bed without using bedrails?: A Little Help needed moving from lying on your back to sitting on the side of a flat bed without using bedrails?: A Little Help needed moving to and from a bed to a chair (including a wheelchair)?: A Little Help needed standing up from a chair using your arms (e.g., wheelchair or bedside chair)?: A Little Help needed to walk in hospital room?: A Little Help needed climbing 3-5 steps with a railing? : A Lot 6 Click Score: 17    End of Session Equipment Utilized During Treatment: Gait belt Activity Tolerance: Patient tolerated treatment well Patient left: in chair;with call bell/phone within reach;with chair alarm set Nurse Communication: Mobility status PT Visit Diagnosis: Other symptoms and signs involving the nervous system (R29.898)     Time: 1610-9604 PT Time Calculation (min) (ACUTE ONLY): 23 min  Charges:  $Gait Training: 8-22 mins $Therapeutic Activity: 8-22 mins                     Zenaida Niece, PT, DPT Acute Rehabilitation Pager: [phone]    Zenaida Niece 02/25/2020, 10:51 AM

## 2020-02-26 LAB — CBC WITH DIFFERENTIAL/PLATELET
Abs Immature Granulocytes: 0.15 10*3/uL — ABNORMAL HIGH (ref 0.00–0.07)
Basophils Absolute: 0 10*3/uL (ref 0.0–0.1)
Basophils Relative: 0 %
Eosinophils Absolute: 0.3 10*3/uL (ref 0.0–0.5)
Eosinophils Relative: 4 %
HCT: 27.9 % — ABNORMAL LOW (ref 36.0–46.0)
Hemoglobin: 8.6 g/dL — ABNORMAL LOW (ref 12.0–15.0)
Immature Granulocytes: 2 %
Lymphocytes Relative: 26 %
Lymphs Abs: 1.7 10*3/uL (ref 0.7–4.0)
MCH: 29.1 pg (ref 26.0–34.0)
MCHC: 30.8 g/dL (ref 30.0–36.0)
MCV: 94.3 fL (ref 80.0–100.0)
Monocytes Absolute: 0.8 10*3/uL (ref 0.1–1.0)
Monocytes Relative: 12 %
Neutro Abs: 3.7 10*3/uL (ref 1.7–7.7)
Neutrophils Relative %: 56 %
Platelets: 465 10*3/uL — ABNORMAL HIGH (ref 150–400)
RBC: 2.96 MIL/uL — ABNORMAL LOW (ref 3.87–5.11)
RDW: 15.2 % (ref 11.5–15.5)
WBC: 6.5 10*3/uL (ref 4.0–10.5)
nRBC: 0 % (ref 0.0–0.2)

## 2020-02-26 LAB — BASIC METABOLIC PANEL
Anion gap: 7 (ref 5–15)
BUN: <5 mg/dL — ABNORMAL LOW (ref 6–20)
CO2: 27 mmol/L (ref 22–32)
Calcium: 8.9 mg/dL (ref 8.9–10.3)
Chloride: 105 mmol/L (ref 98–111)
Creatinine, Ser: 0.43 mg/dL — ABNORMAL LOW (ref 0.44–1.00)
GFR, Estimated: >60 mL/min (ref >60)
Glucose, Bld: 106 mg/dL — ABNORMAL HIGH (ref 70–99)
Potassium: 3.5 mmol/L (ref 3.5–5.1)
Sodium: 139 mmol/L (ref 135–145)

## 2020-02-26 LAB — GLUCOSE, CAPILLARY
Glucose-Capillary: 116 mg/dL — ABNORMAL HIGH (ref 70–99)
Glucose-Capillary: 105 mg/dL — ABNORMAL HIGH (ref 70–99)
Glucose-Capillary: 143 mg/dL — ABNORMAL HIGH (ref 70–99)
Glucose-Capillary: 152 mg/dL — ABNORMAL HIGH (ref 70–99)
Glucose-Capillary: 180 mg/dL — ABNORMAL HIGH (ref 70–99)

## 2020-02-26 NOTE — Progress Notes (Signed)
  Speech Language Pathology Treatment: Dysphagia;Cognitive-Linquistic  Patient Details Name: Jill Clarke MRN: [id number] DOB: 11/08/1985 Today's Date: 02/26/2020 Time: 1540-0867 SLP Time Calculation (min) (ACUTE ONLY): 31 min  Assessment / Plan / Recommendation Clinical Impression  Followed up for diet tolerance. Pt denies difficulty since diet upgrade to regular textures and thin liquids. Assessed with thin liquids. Delayed cough x1 noted. Pt declined solid snack, reports awaiting lunch. Continue regular thin liquid diet with standard aspiration precautions.   Pt oriented to environment and situation. Pt fluent with expressive language and appeared to comprehend verbal information during clinical interaction with SLP. Pt had difficulty independently scanning to right visual filed during functional task challenge with menu at bedside. With cueing pt able to follow commands, localize information. Pt continues to exhibit reduced awareness to deficits post CVA. SLP to follow up.    HPI HPI: 35 yo presented with AMS. MRI >  left frontoparietal hemorrhagic venous infarct (no significant changed 02/15/2020.),  hemorrhagic infarct within the right callosal, possible new punctate acute infarct within the left thalamocapsular junction, bilateral deep watershed white matter infarcts, persistent trace subarachnoid hemorrhage along the left  frontoparietal convexity. Intubated 1/20-1/25. CXR Diffuse bilateral pulmonary infiltrates/edema. PMH: DM, sickle cell trait, asthma.      SLP Plan  Continue with current plan of care       Recommendations  Diet recommendations: Regular;Thin liquid Liquids provided via: Cup;Straw Medication Administration: Whole meds with liquid Supervision: Patient able to self feed;Full supervision/cueing for compensatory strategies Compensations: Minimize environmental distractions;Monitor for anterior loss Postural Changes and/or Swallow Maneuvers: Seated upright 90  degrees;Upright 30-60 min after meal                Oral Care Recommendations: Oral care BID Follow up Recommendations: Inpatient Rehab SLP Visit Diagnosis: Dysphagia, oral phase (R13.11);Cognitive communication deficit (R41.841) Plan: Continue with current plan of care       [place], [place]   02/26/2020, 1:20 PM

## 2020-02-26 NOTE — Progress Notes (Signed)
STROKE TEAM PROGRESS NOTE  INTERVAL HISTORY No overnight events.  Patient is evaluated at bedside this morning.  No family present in the room.  She is alert, active, cooperative with exam.  Pt was eating regular food with no complaints. She is on Pradaxa. She is afebrile. Vitals stable.  Neuro exam unchanged, still have mild right hand and right leg weakness.  Hematocrit stable. K normal at 3.5. Patient is medically stable for rehab awaiting bed availability. We'll discontinue IV fluids as Pt is tolerating orally with no difficulty. Encourage Pt to drink fluids orally.   Vitals:   02/26/20 0323 02/26/20 0635 02/26/20 0757 02/26/20 1132  BP: 124/84  106/76 98/66  Pulse: (!) 108  87 90  Resp: 17  17 18   Temp: 97.7 F (36.5 C)  98 F (36.7 C) 98.3 F (36.8 C)  TempSrc: Oral  Oral Oral  SpO2: 100%  100% 100%  Weight:  69.4 kg    Height:       CBC:  Recent Labs  Lab 02/23/20 0450 02/26/20 0830  WBC 10.3 6.5  NEUTROABS  --  3.7  HGB 9.6* 8.6*  HCT 29.5* 27.9*  MCV 91.9 94.3  PLT 512* 409*   Basic Metabolic Panel:  Recent Labs  Lab 02/21/20 0122 02/22/20 0041 02/25/20 0500 02/26/20 0830  NA 137   < > 139 139  K 3.1*   < > 3.2* 3.5  CL 106   < > 107 105  CO2 25   < > 26 27  GLUCOSE 129*   < > 124* 106*  BUN <5*   < > <5* <5*  CREATININE 0.37*   < > 0.39* 0.43*  CALCIUM 8.9   < > 8.7* 8.9  MG 2.0  --   --   --    < > = values in this interval not displayed.   Lipid Panel:  No results for input(s): CHOL, TRIG, HDL, CHOLHDL, VLDL, LDLCALC in the last 168 hours.  IMAGING past 24 hours  MR Brain 02/18/2020 1. Motion degraded exam. 2. 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 3. Restricted diffusion at site of an acute/subacute hemorrhagic infarct within the right callosal splenium has subtly increased in extent. 4. T2/FLAIR hyperintensity within the deep gray nuclei persists  but has diminished. However, there may be a new punctate acute infarct within the left thalamocapsular junction. 5. 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 6. Persistent trace subarachnoid hemorrhage along the left frontoparietal convexity.  MRV Head 02/18/2020 1. Flow within the superior sagittal sinus has significantly improved since the prior MRV of 02/15/2020. However, there are persistent foci of nonocclusive thrombus within this vessel. 2. Nonocclusive thrombus within the left transverse and proximal sigmoid dural venous sinuses has increased. 3. Persistent nonocclusive thrombus within the right transverse sinus. 4. Persistent near occlusive or occlusive thrombus within the right sigmoid sinus and visualized right internal jugular vein. 5. The degree of flow within the deep venous system is unchanged. 6. The veins of [place]rolard and a few additional cerebral veins overlying the bilateral frontoparietal convexities remain thrombosed.  Echocardiogram 02/16/2020 IMPRESSIONS  1. Left ventricular ejection fraction, by estimation, is 55 to 60%. The  left ventricle has normal function. The left ventricle has no regional  wall motion abnormalities. Left ventricular diastolic parameters were  normal.  2. Right ventricular systolic function is normal. The right ventricular  size  is normal. There is normal pulmonary artery systolic pressure.  3. Mild mitral valve leaflet thickening and calcification. Trivial mitral  valve regurgitation.  4. The aortic valve is tricuspid. There is mild thickening of the aortic  valve. Aortic valve regurgitation is not visualized. No aortic stenosis is  present.  5. The inferior vena cava is normal in size with <50% respiratory  variability, suggesting right atrial pressure of 8 mmHg.   CT Head wo contrast 02/16/2020 IMPRESSION: 1. Continued interval decrease in conspicuity of  scattered small volume subarachnoid hemorrhage involving both frontoparietal convexities, now only faintly visible and nearly resolved. 2. Continued interval evolution of bilateral watershed territory infarcts, most pronounced at the left frontoparietal convexity. Overall size and distribution is relatively similar. No hemorrhagic transformation by CT. 3. Persistent but improved hyperdensity about the veins of Trolard since prior CT. Likewise, the remainder of the dural sinuses also appear less dense and engorged as compared to previous. 4. No other new acute intracranial abnormality.  CT Head wo contrast 02/14/2020 IMPRESSION: Small volume bilateral frontoparietal and basilar cistern subarachnoid hemorrhage is unchanged. No new intracranial hemorrhage. Small bilateral cerebral infarcts are not well demonstrated on this exam. Increased conspicuity of left cerebral edema. Hyperdense appearance of the cortical veins, superior sagittal and right transverse sinus is less conspicuous than prior exam.  IR Thrombectomy 02/13/2020 Findings. 1.Extensive thrombus load in the TS bilaterally,the Rt SS ,RT IJV and post one third of the SSS. Large chunks of clot removed with revascularization of the TS bilaterally and partially the RT SS and the torcula. Sig decreased venous congestion of the cortical veins noted post procedure. Post CT brain No ICH or mass effect or hydrocephalus. Clinically stable with pupils 4mm sluggish.  CT Head wo contrast 02/12/2020 IMPRESSION: 1. The study is positive for a small volume of subarachnoid hemorrhage. There is no midline shift. 2. Hyperdense dural sinuses and cortical veins especially on the right in addition to a filling defect within the transverse sinus on the right is concerning for dural venous thrombosis and may be the source of the patient's subarachnoid hemorrhage. Emergent MRI/MRV is recommended for further evaluation.  MR Brain wo  contrast MR Venogram Head 02/12/2020 IMPRESSION: 1. Extensive, acute dural venous sinus thrombosis involving the superior sagittal sinus into the right internal jugular vein via the dominant right transverse and sigmoid system. Both veins of Trolard are thrombosed and there is straight sinus/deep cerebral thrombosis as well. 2. Deep watershed white matter infarcts, small volume subarachnoid hemorrhage, and left more than right frontoparietal cortical swelling due to #1.  MR Brain wo contrast MR Venogram Head 02/15/2020  Brain MRI IMPRESSION: 1. Progressive left perirolandic venous infarction and petechial hemorrhage. 2. New edema in the deep gray nuclei without infarction.  Intracranial MRV: IMPRESSION: 1. Improved flow especially in the deep venous system. Improved flow also seen in the proximal right transverse sinus. 2. Occlusive clot still seen in the superior sagittal, distal right transverse, and right sigmoid dural sinuses. No recanalization of the veins of Trolard.  EEG adult 02/12/2020  ABNORMALITY -Continue slow, generalized IMPRESSION: This study is suggestive of moderate diffuse encephalopathy, nonspecific etiology.  At 1531, patient was noted to have right upper extremity jerking.  Concomitant EEG showed high amplitude 2 to 3 Hz rhythmic delta slowing in bitemporal region with evolution in morphology and amplitude but without definite evolution in frequency concerning for focal motor seizure.  EEG adult  02/16/2020 IMPRESSION: This study is suggestive of moderate to severe diffuse encephalopathy,  nonspecific etiology.  No seizures or epileptiform discharges were seen throughout the recording.  PHYSICAL EXAM GENERAL: Well developed, young african american lady  sitting comfortably in bed, NAD Head: Normocephalic and atraumatic. EENT: No OP obstruction, normal conjunctiva.  LUNGS - Normal respiratory effort on room air CV - No peripheral edema.  ABDOMEN - Soft,  non-distended Ext: warm, well perfused.  Neurological:  Mental Status: Alert, awake, oriented to time, person, place and situation.  Follow midline and appendicular commands.  Speech is improving  with some word finding difficulties.  No paraphasic errors.  Good comprehension.   Cranial Nerves: PERRL, Pt is not able to follow finger but observed to move eyes in all direction otherwise. visual fields full, no facial asymmetry, facial sensation intact, hearing intact, tongue/uvula/soft palate midline, normal sternocleidomastoid and trapezius muscle strength. No evidence of tongue atrophy or fibrillations.  Motor: Mild Drift in Rt Upper Extremity, No Drift in LE's. Strength 4+/5 in Rt UE, mild weakness in RT hand demonstrated on rapid tapping movements, 5 /5 in Lt UE, Strength in Rt LE  4/5, Lt LE 4/5. Poor effort observed.  Reflexes- UE -2+  , LE- 2+,  Tone: is normal and bulk is normal. Sensation- Intact to light touch bilaterally. Coordination: FTN intact bilaterally, no ataxia in BLE. Gait- deferred  ASSESSMENT/PLAN Ms. KENECIA KLENKE is a 35 y.o. female with PMHx of asthma and sickle cell trait presented with altered mental status.  CT head revealed a small volume subarachnoid hemorrhage and hyperdense dural sinuses with filling defect concerning for dural venous thrombosis.  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  Extremely delicate situation, that is why, IR is consulted for thrombectomy and patient is with IR now. Pt is medically stable for Rehab.  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     MRI  and MRV 02/12/20 A). Extensive, acute dural venous sinus thrombosis involving the superior sagittal sinus into the right internal jugular vein via the dominant right transverse and sigmoid system. B).Both veins of Trolard are thrombosed and there is straight sinus/deep cerebral thrombosis as well. C). Deep watershed white matter infarcts, small volume subarachnoid hemorrhage, and left more than right frontoparietal cortical swelling due to #1.  IR Thrombectomy: 02/13/2020- Large chunks of clot removed with revascularization of the TS bilaterally and partially the RT SS and the torcula.Sig decreased venous congestion of the cortical veins noted post procedure.  MRI and MRV 02/15/20 - Improved flow especially in the deep venous system. Improved flow also seen in the proximal right transverse sinus. Occlusive clot still seen in the superior sagittal, distal right transverse, and right sigmoid dural sinuses. No recanalization of the veins of Trolard. Progressive left perirolandic venous infarction and petechial hemorrhage. New edema in the deep gray nuclei without infarction.  IR Thrombectomy: 02/15/2020 - complete revascularization of SSS,Straight S,V of [place] and ICVs ,and RT TS. Sig focal stenosis at the RT TS/SS junction angioplastied with balloon with  improved flow into RT SS.  MRI and MRV 02/18/20: acute/subacute left frontoparietal and right CC hemorrhagic venous infarct, b/l deep watershed white matter infarcts appear similar to the  prior MRI. Flow within the SSS has significantly improved since the prior MRV of 02/15/2020. However, there are persistent foci of occlusive or nonocclusive thrombus within SSS, b/l TS, b/l SS, and right IJ. Degree of flow within deep venous system unchanged, veins of Trolard remain to be thrombosed  2D Echo - EF is 55 to 60%.  LDL 108  HgbA1c 4.9   Hypercoagulable panel: mildly decreased protein S, but may be an acute phase reactant and should be rechecked as  an outpatient in 6 to 8 weeks.  Repeat COVID testing: negative on 02/14/20 and on 02/11/20  VTE prophylaxis - Was on IV heparin now on Pradaxa.   No antithrombotic prior to admission, was on IV heparin now on Pradaxa 150 mg twice daily.   On amantadine to facilitate arousal  Therapy recommendations: CIR [employer] not on file, Rehab coordinator will reach out to Development worker, community for medicaid screening)  Disposition: TBD  Cerebral edema   On D5 with NS > Discontinued (02/26/20)  MRI 1/23 new edema in the deep gray nuclei without infarction  MRI 1/26: Dominant acute/subacute left frontoparietal hemorrhagic venous infarct not significantly changed from MRI done on 1/23.   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  on keppra 500 bid now.  Repeat EEG 02/16/20 - moderate to severe diffuse encephalopathy  Continue keppra  Respiratory failure  Intubated on sedation -> extubated 1/25  Tolerating well so far  Hypotension  BP stable on the low end  Phenylephrine started now off  . On IVF and TF . BP goal 120-160 . Long-term BP goal normotensive  Anemia - acute blood loss  Hgb - 11.0->8.2->8.6->7.2->7.0->8.2->8.0->8.2>9.4>8.9>9.5>9.6 >8.6  May related to repeated procedure and IV heparin- Heparin stopped now on Pradaxa.   1 unit PRBC transfusion on 02/16/2020  CBC monitoring  Hyperlipidemia  Home meds: None  LDL 108, goal < 70  On Lipitor 40 mg  Continue statin at discharge  Dysphagia   Off OG 02/17/20  Modified Barium Swallow - There was no penetration/aspiration. Recommended advancing diet to regular solids, thin liquids; continue to crush meds.  Regular diet  Other Stroke  Risk Factors  Sickle Cell Trait: No associated risk found due to sickle cell trait.    Other Active Problems  Asthma   Hypokalemia - potassium - 2.9->3.0->3.6->3.1 >3.8->2.8->3.4->3.1-> supplement ->3.3-> supplement>3.3>supplement -> 3.5  Leukocytosis - WBC's - 13.3->15.9 (afebrile) >15.2->12.3->10.3>6.5 (afebrile)  Mastoiditis -> Unasyn started 02/18/20.   Hospital day # 14   I have personally obtained history,examined this patient, reviewed notes, independently viewed imaging studies, participated in medical decision making and plan of care.ROS completed by me personally and pertinent positives fully documented  I have made any additions or clarifications directly to the above note. Agree with note above. Greater than 50% time during this 25-minute visit was spent on counseling and coordination of care about her deep cerebral venous sinus thrombosis and discussion with care team and answering questions. Medically stable to be transferred to inpatient rehab when bed available  Antony Contras, MD Medical Director [facility] Pager: [phone] 02/26/2020 3:42 PM   To contact Stroke Continuity provider, please refer to [email or web address] After hours, contact General Neurology

## 2020-02-26 NOTE — Progress Notes (Signed)
Physical Therapy Treatment Patient Details Name: MARCUS GROLL MRN: [id number] DOB: 07/08/1985 Today's Date: 02/26/2020    History of Present Illness 35 y.o. female with a history significant for asthma and sickle cell trait presented to ED 1/19 for N/V and discharged home. Returned 1/20 due to AMS. CT Head revealing a small volume subarachnoid hemorrhage and hyperdense dural sinuses with a filling defect concerning for dural venous thrombosis. 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 Intubated 1/21-1/25.    PT Comments    Pt tolerates treatment well but continues to demonstrate reduced attention, impaired short term memory, and decreased ability to follow multi-step commands. Pt demonstrates a slight posterior lean in standing and experiences LOBs which require PT assistance to correct. Balance deficits are exacerbated by dual-task activities.  Pt will continue to benefit from acute PT POC to improve quality of mobility and reduce fall risk. PT continues to recommend CIR placement at this time.   Follow Up Recommendations  CIR     Equipment Recommendations  None recommended by PT    Recommendations for Other Services       Precautions / Restrictions Precautions Precautions: Fall Precaution Comments: fall on 1/31 attempting to get OOB alone Restrictions Weight Bearing Restrictions: No    Mobility  Bed Mobility Overal bed mobility: Needs Assistance Bed Mobility: Supine to Sit     Supine to sit: Supervision        Transfers Overall transfer level: Needs assistance Equipment used: None Transfers: Sit to/from Stand Sit to Stand: Min assist            Ambulation/Gait Ambulation/Gait assistance: Min assist Gait Distance (Feet): 100 Feet (100 x 5, separated by dual-task activities) Assistive device: None Gait Pattern/deviations: Step-through pattern Gait velocity: reduced Gait velocity  interpretation: <1.8 ft/sec, indicate of risk for recurrent falls General Gait Details: posterior lean in standing; able to turn head side to side and up and down while ambulating without LOB; unable to increase speed from baseline; minimal ability to slow speed from baseline; difficulty following multi-step commands; easily distracted by environment   Stairs Stairs: Yes Stairs assistance: Min guard Stair Management: One rail Left;Step to pattern Number of Stairs: 3     Wheelchair Mobility    Modified Rankin (Stroke Patients Only) Modified Rankin (Stroke Patients Only) Pre-Morbid Rankin Score: No symptoms Modified Rankin: Moderately severe disability     Balance Overall balance assessment: Needs assistance Sitting-balance support: No upper extremity supported;Feet supported Sitting balance-Leahy Scale: Good     Standing balance support: No upper extremity supported Standing balance-Leahy Scale: Poor                              Cognition Arousal/Alertness: Awake/alert Behavior During Therapy: WFL for tasks assessed/performed Overall Cognitive Status: Impaired/Different from baseline Area of Impairment: Attention;Memory;Following commands;Safety/judgement;Awareness;Problem solving                   Current Attention Level: Sustained Memory: Decreased short-term memory Following Commands: Follows one step commands consistently;Follows multi-step commands inconsistently Safety/Judgement: Decreased awareness of deficits Awareness: Emergent Problem Solving: Difficulty sequencing        Exercises      General Comments        Pertinent Vitals/Pain Pain Assessment: No/denies pain    Home Living  Prior Function            PT Goals (current goals can now be found in the care plan section) Acute Rehab PT Goals Patient Stated Goal: to return to independent mobility Progress towards PT goals: Progressing toward  goals    Frequency    Min 4X/week      PT Plan Current plan remains appropriate    Co-evaluation              AM-PAC PT "6 Clicks" Mobility   Outcome Measure  Help needed turning from your back to your side while in a flat bed without using bedrails?: A Little Help needed moving from lying on your back to sitting on the side of a flat bed without using bedrails?: A Little Help needed moving to and from a bed to a chair (including a wheelchair)?: A Little Help needed standing up from a chair using your arms (e.g., wheelchair or bedside chair)?: A Little Help needed to walk in hospital room?: A Little Help needed climbing 3-5 steps with a railing? : A Little 6 Click Score: 18    End of Session Equipment Utilized During Treatment: Gait belt Activity Tolerance: Patient tolerated treatment well Patient left: in chair;with call bell/phone within reach;with chair alarm set Nurse Communication: Mobility status PT Visit Diagnosis: Other symptoms and signs involving the nervous system (R29.898)     Time: 5284-1324 PT Time Calculation (min) (ACUTE ONLY): 23 min  Charges:  $Gait Training: 8-22 mins $Therapeutic Activity: 8-22 mins                     Curley Spice, SPT  Pager: [phone]  Ward Givens 02/26/2020, 11:52 AM

## 2020-02-26 NOTE — Progress Notes (Signed)
Inpatient Rehabilitation-Admissions Coordinator   I do not have a bed available for this patient in CIR today. Will follow up tomorrow for possible admit, pending bed availability. Pt is aware.   Raechel Ache, OTR/L  Rehab Admissions Coordinator  [phone] 02/26/2020 10:58 AM

## 2020-02-27 LAB — GLUCOSE, CAPILLARY
Glucose-Capillary: 127 mg/dL — ABNORMAL HIGH (ref 70–99)
Glucose-Capillary: 106 mg/dL — ABNORMAL HIGH (ref 70–99)
Glucose-Capillary: 137 mg/dL — ABNORMAL HIGH (ref 70–99)
Glucose-Capillary: 136 mg/dL — ABNORMAL HIGH (ref 70–99)
Glucose-Capillary: 166 mg/dL — ABNORMAL HIGH (ref 70–99)

## 2020-02-27 LAB — CBC
HCT: 28.4 % — ABNORMAL LOW (ref 36.0–46.0)
Hemoglobin: 8.8 g/dL — ABNORMAL LOW (ref 12.0–15.0)
MCH: 29.5 pg (ref 26.0–34.0)
MCHC: 31 g/dL (ref 30.0–36.0)
MCV: 95.3 fL (ref 80.0–100.0)
Platelets: 504 10*3/uL — ABNORMAL HIGH (ref 150–400)
RBC: 2.98 MIL/uL — ABNORMAL LOW (ref 3.87–5.11)
RDW: 15.1 % (ref 11.5–15.5)
WBC: 7.4 10*3/uL (ref 4.0–10.5)
nRBC: 0 % (ref 0.0–0.2)

## 2020-02-27 NOTE — Progress Notes (Signed)
Physical Therapy Treatment Patient Details Name: Jill Clarke MRN: [id number] DOB: Jun 14, 1985 Today's Date: 02/27/2020    History of Present Illness 35 y.o. female with a history significant for asthma and sickle cell trait presented to ED 1/19 for N/V and discharged home. Returned 1/20 due to AMS. CT Head revealing a small volume subarachnoid hemorrhage and hyperdense dural sinuses with a filling defect concerning for dural venous thrombosis. 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 Intubated 1/21-1/25.    PT Comments    Pt limited by reports of HA, lightheadedness, and dizziness at this time. Pt continues to demonstrate impaired memory and very poor attention at this time. Pt also demonstrates and impaired ability to maintain balance when performing dynamic gait tasks. Pt's poor attention exacerbates her falls risk as she bumps into multiple objects this session when distracted. Pt will benefit from continued acute PT POC to improve attention to tasks at hand and to improve balance. PT continues to recommend CIR placement. If the patient is unable to be admitted to CIR then the patient will benefit from supervision for all out of bed mobility.  Follow Up Recommendations  CIR     Equipment Recommendations  None recommended by PT    Recommendations for Other Services       Precautions / Restrictions Precautions Precautions: Fall Precaution Comments: fall on 1/31 attempting to get OOB alone Restrictions Weight Bearing Restrictions: No    Mobility  Bed Mobility               General bed mobility comments: pt received and left in recliner  Transfers Overall transfer level: Needs assistance Equipment used: None Transfers: Sit to/from Stand Sit to Stand: Min guard            Ambulation/Gait Ambulation/Gait assistance: Min assist Gait Distance (Feet): 80 Feet (80' x 2) Assistive device: None Gait  Pattern/deviations: Step-through pattern Gait velocity: reduced Gait velocity interpretation: <1.8 ft/sec, indicate of risk for recurrent falls General Gait Details: pt with step-through gait, does demonstrate increased lateral sway with dual task head turns. Pt also bumping into multiple objects on R side when distracted   Stairs             Wheelchair Mobility    Modified Rankin (Stroke Patients Only) Modified Rankin (Stroke Patients Only) Pre-Morbid Rankin Score: No symptoms Modified Rankin: Moderately severe disability     Balance Overall balance assessment: Needs assistance Sitting-balance support: No upper extremity supported;Feet supported Sitting balance-Leahy Scale: Good     Standing balance support: No upper extremity supported Standing balance-Leahy Scale: Fair                              Cognition Arousal/Alertness: Awake/alert Behavior During Therapy: WFL for tasks assessed/performed Overall Cognitive Status: Impaired/Different from baseline Area of Impairment: Attention;Memory;Following commands;Safety/judgement;Awareness;Problem solving                   Current Attention Level: Sustained Memory: Decreased short-term memory Following Commands: Follows one step commands consistently;Follows multi-step commands inconsistently Safety/Judgement: Decreased awareness of safety;Decreased awareness of deficits Awareness: Emergent Problem Solving: Difficulty sequencing General Comments: pt demonstrates very poor attention during session, easily distracted when ambulating and bumping into multiple objects      Exercises      General Comments General comments (skin integrity, edema, etc.): pt tachy up to 137 observed on monitor. Pt reports some  lightheadedness with mobility, BP of 107/73 taken after return to room. Pt's HA and reports of dizziness/lightheadedness limiting activity tolerance this session      Pertinent Vitals/Pain Pain  Assessment: 0-10 Pain Score: 9  Pain Location: head Pain Descriptors / Indicators: Headache Pain Intervention(s): Patient requesting pain meds-RN notified    Home Living                      Prior Function            PT Goals (current goals can now be found in the care plan section) Acute Rehab PT Goals Patient Stated Goal: to return to independent mobility Progress towards PT goals: Not progressing toward goals - comment (limited by HA)    Frequency    Min 4X/week      PT Plan Current plan remains appropriate    Co-evaluation              AM-PAC PT "6 Clicks" Mobility   Outcome Measure  Help needed turning from your back to your side while in a flat bed without using bedrails?: A Little Help needed moving from lying on your back to sitting on the side of a flat bed without using bedrails?: A Little Help needed moving to and from a bed to a chair (including a wheelchair)?: A Little Help needed standing up from a chair using your arms (e.g., wheelchair or bedside chair)?: A Little Help needed to walk in hospital room?: A Little Help needed climbing 3-5 steps with a railing? : A Little 6 Click Score: 18    End of Session Equipment Utilized During Treatment: Gait belt Activity Tolerance: Patient limited by pain Patient left: in chair;with call bell/phone within reach;with chair alarm set Nurse Communication: Mobility status PT Visit Diagnosis: Other symptoms and signs involving the nervous system (R29.898)     Time: 9767-3419 PT Time Calculation (min) (ACUTE ONLY): 24 min  Charges:  $Gait Training: 8-22 mins $Therapeutic Activity: 8-22 mins                     Zenaida Niece, PT, DPT Acute Rehabilitation Pager: [phone]    Zenaida Niece 02/27/2020, 1:05 PM

## 2020-02-27 NOTE — Discharge Summary (Signed)
Stroke Discharge Summary  Patient ID: Jill Clarke   MRN: [id number]      DOB: May 30, 1985  Date of Admission: 02/12/2020 Date of Discharge: 02/28/2020  Attending Physician:  No att. providers found, Stroke Clarke Consultant(s):   [facility] Patient's PCP:  Pcp, No  Discharge Diagnoses:  Active Problems:   Acute cerebral venous sinus thrombosis   Cerebral thrombosis with cerebral infarction   Respiratory failure (HCC)   Middle cerebral artery embolism, left   Dural venous sinus thrombosis   Hypokalemia  Medications to be continued on Rehab Allergies as of 02/28/2020   No Known Allergies     Medication List    You have not been prescribed any medications.     LABORATORY STUDIES CBC    Component Value Date/Time   WBC 6.0 02/28/2020 0413   RBC 2.86 (L) 02/28/2020 0413   HGB 8.4 (L) 02/28/2020 0413   HCT 27.3 (L) 02/28/2020 0413   PLT 462 (H) 02/28/2020 0413   MCV 95.5 02/28/2020 0413   MCH 29.4 02/28/2020 0413   MCHC 30.8 02/28/2020 0413   RDW 14.8 02/28/2020 0413   LYMPHSABS 1.7 02/26/2020 0830   MONOABS 0.8 02/26/2020 0830   EOSABS 0.3 02/26/2020 0830   BASOSABS 0.0 02/26/2020 0830   CMP    Component Value Date/Time   NA 139 02/28/2020 0413   K 3.6 02/28/2020 0413   CL 102 02/28/2020 0413   CO2 28 02/28/2020 0413   GLUCOSE 114 (H) 02/28/2020 0413   BUN 6 02/28/2020 0413   CREATININE 0.45 02/28/2020 0413   CREATININE 0.44 (L) 01/30/2011 1147   CALCIUM 9.2 02/28/2020 0413   PROT 8.3 (H) 02/12/2020 0127   ALBUMIN 3.6 02/12/2020 0127   AST 16 02/12/2020 0127   ALT 30 02/12/2020 0127   ALKPHOS 52 02/12/2020 0127   BILITOT 1.0 02/12/2020 0127   GFRNONAA >60 02/28/2020 0413   GFRAA 60 (L) 12/01/2016 1806   COAGS Lab Results  Component Value Date   INR 1.2 02/12/2020   Lipid Panel    Component Value Date/Time   CHOL 148 02/13/2020 0333   TRIG 86 02/17/2020 0500   HDL 28 (L) 02/13/2020 0333   CHOLHDL 5.3 02/13/2020 0333   VLDL 12 02/13/2020  0333   LDLCALC 108 (H) 02/13/2020 0333   HgbA1C  Lab Results  Component Value Date   HGBA1C 4.9 02/13/2020   Urinalysis    Component Value Date/Time   COLORURINE YELLOW 02/12/2020 0701   APPEARANCEUR CLOUDY (A) 02/12/2020 0701   LABSPEC 1.024 02/12/2020 0701   PHURINE 6.0 02/12/2020 0701   GLUCOSEU 50 (A) 02/12/2020 0701   HGBUR NEGATIVE 02/12/2020 0701   BILIRUBINUR NEGATIVE 02/12/2020 0701   KETONESUR 80 (A) 02/12/2020 0701   PROTEINUR 100 (A) 02/12/2020 0701   UROBILINOGEN 0.2 09/23/2011 1722   NITRITE NEGATIVE 02/12/2020 0701   LEUKOCYTESUR NEGATIVE 02/12/2020 0701   Urine Drug Screen     Component Value Date/Time   LABOPIA NONE DETECTED 02/12/2020 0701   COCAINSCRNUR NONE DETECTED 02/12/2020 0701   LABBENZ NONE DETECTED 02/12/2020 0701   AMPHETMU NONE DETECTED 02/12/2020 0701   THCU NONE DETECTED 02/12/2020 0701   LABBARB NONE DETECTED 02/12/2020 0701    Alcohol Level    Component Value Date/Time   ETH <10 02/12/2020 0417     SIGNIFICANT DIAGNOSTIC STUDIES DG Abd 1 View  Result Date: 02/14/2020 CLINICAL DATA:  OG tube placement. EXAM: ABDOMEN - 1 VIEW COMPARISON:  None. FINDINGS: Enteric  tube tip and side-port project over the stomach. Lung bases are clear. IMPRESSION: Enteric tube tip and side-port project over the stomach. Electronically Signed   By: Lovey Newcomer M.D.   On: 02/14/2020 13:07   CT Head Wo Contrast  Result Date: 02/16/2020 CLINICAL DATA:  Follow-up examination for acute stroke. EXAM: CT HEAD WITHOUT CONTRAST TECHNIQUE: Contiguous axial images were obtained from the base of the skull through the vertex without intravenous contrast. COMPARISON:  Prior CT and MRI from 02/15/2020. FINDINGS: Brain: Continued interval decrease in conspicuity of scattered small volume subarachnoid hemorrhage involving both frontal parietal convexities, now only faintly visible and nearly resolved. No significant basilar cistern hemorrhage now evident. Continued interval  evolution of bilateral watershed territory infarcts, most pronounced at the left frontoparietal convexity. Overall size and distribution is relatively similar. No hemorrhagic transformation evident by CT. Previously noted edema involving the deep gray nuclei difficult to visualize by CT, although there are suspected persistent changes. No new hemorrhage or acute ischemic changes. No midline shift or significant mass effect. No hydrocephalus or extra-axial fluid collection. Vascular: Persistent but improved hyperdensity seen about the veins of true large since prior CT. Likewise, the superior sagittal sinus appears less dense and engorged as compared to previous. Remainder of the dural sinuses also appear less dense. No hyperdense arterial vessel. Skull: Scalp soft tissues demonstrate no new finding. Calvarium unchanged. Sinuses/Orbits: Globes and orbital soft tissues within normal limits. Extensive mucosal thickening with air-fluid levels noted throughout the paranasal sinuses similar to previous. Trace right mastoid effusion. Other: None. IMPRESSION: 1. Continued interval decrease in conspicuity of scattered small volume subarachnoid hemorrhage involving both frontoparietal convexities, now only faintly visible and nearly resolved. 2. Continued interval evolution of bilateral watershed territory infarcts, most pronounced at the left frontoparietal convexity. Overall size and distribution is relatively similar. No hemorrhagic transformation by CT. 3. Persistent but improved hyperdensity about the veins of [place]rolard since prior CT. Likewise, the remainder of the dural sinuses also appear less dense and engorged as compared to previous. 4. No other new acute intracranial abnormality. Electronically Signed   By: Jeannine Boga M.D.   On: 02/16/2020 00:59   CT HEAD WO CONTRAST  Result Date: 02/14/2020 CLINICAL DATA:  Stroke, follow up EXAM: CT HEAD WITHOUT CONTRAST TECHNIQUE: Contiguous axial images were  obtained from the base of the skull through the vertex without intravenous contrast. COMPARISON:  02/12/2020. FINDINGS: Brain: Decreased conspicuity of small volume subarachnoid hemorrhage along the bilateral frontoparietal regions within the basilar cistern. No new site of intracranial hemorrhage. Known small bilateral cerebral infarcts are not well demonstrated on this exam. Increased conspicuity of left cerebral edema. No midline shift or mass lesion.  No ventriculomegaly. Vascular: Hyperdense appearance of the cortical veins, posterosuperior sagittal sinus and right transverse sinus is less conspicuous than prior exam. No hyperdense arterial vessels. Skull: Negative for fracture or focal lesion. Sinuses/Orbits: No acute finding. Mild pansinus mucosal thickening with layering secretions. Other: None. IMPRESSION: Small volume bilateral frontoparietal and basilar cistern subarachnoid hemorrhage is unchanged. No new intracranial hemorrhage. Small bilateral cerebral infarcts are not well demonstrated on this exam. Increased conspicuity of left cerebral edema. Hyperdense appearance of the cortical veins, superior sagittal and right transverse sinus is less conspicuous than prior exam. Electronically Signed   By: Primitivo Gauze M.D.   On: 02/14/2020 09:58   CT Head Wo Contrast  Result Date: 02/12/2020 CLINICAL DATA:  Mental status change. EXAM: CT HEAD WITHOUT CONTRAST TECHNIQUE: Contiguous axial images were obtained from  the base of the skull through the vertex without intravenous contrast. COMPARISON:  None. FINDINGS: Brain: There is a small volume of subarachnoid hemorrhage bilaterally, most evident in the right frontal parietal region (axial series 3, image 14). No midline shift. The ventricular system is unremarkable.There is a small volume of extra-axial hemorrhage in the basal cisterns. The gray-white differentiation is unremarkable. The cortical veins appear hyperdense and somewhat enlarged. There is a  questionable thrombus within the right transverse sinus. Vascular: There is no hyperdense arterial structure. Skull: The calvarium is unremarkable. The skull base is unremarkable. The visualized upper cervical spine is unremarkable. Sinuses/Orbits: The visualized orbits are unremarkable. The paranasal sinuses are unremarkable. The mastoid air cells are clear. Other: The visualized parotid gland is unremarkable. There is no scalp soft tissue swelling. IMPRESSION: 1. The study is positive for a small volume of subarachnoid hemorrhage. There is no midline shift. 2. Hyperdense dural sinuses and cortical veins especially on the right in addition to a filling defect within the transverse sinus on the right is concerning for dural venous thrombosis and may be the source of the patient's subarachnoid hemorrhage. Emergent MRI/MRV is recommended for further evaluation. These results were called by telephone at the time of interpretation on 02/12/2020 at 4:10 am to provider Veryl Speak , who verbally acknowledged these results. Electronically Signed   By: Constance Holster M.D.   On: 02/12/2020 04:13   MR BRAIN WO CONTRAST  Result Date: 02/18/2020 CLINICAL DATA:  Stroke, follow-up. EXAM: MRI HEAD WITH CONTRAST MRV HEAD WITHOUT AND WITH CONTRAST TECHNIQUE: Multiplanar, multiecho pulse sequences of the brain and surrounding structures were obtained with intravenous contrast. Angiographic images of the intracranial venous structures were obtained using MRV technique without and with intravenous contrast. COMPARISON:  Head CT 02/16/2020, report from thrombectomy 02/15/2020, MRI/MRV head 02/15/2020 CONTRAST:  57m GADAVIST GADOBUTROL 1 MMOL/ML IV SOLN FINDINGS: MR HEAD FINDINGS Brain: The examination is intermittently motion degraded, limiting evaluation. Most notably, there is severe motion degradation of the sagittal T1 weighted sequence and moderate motion degradation of the coronal T2 weighted sequence. The dominant  acute/subacute hemorrhagic venous infarct within the left frontoparietal lobes has not significantly changed. Unchanged local mass effect with partial effacement of the left lateral ventricle. Restricted diffusion at site of an acute/early subacute hemorrhagic infarct within the right callosal splenium has subtly increased in extent. Persistent although decreased T2/FLAIR hyperintensity within the bilateral basal ganglia and thalami. Possible new punctate acute infarct within the left thalamus capsular junction (series 4, image 24). Bilateral acute/early subacute deep watershed white matter infarcts otherwise appear similar in distribution and extent. Persistent trace subarachnoid hemorrhage along the left frontoparietal convexity. No midline shift. Vascular: Expected proximal arterial flow voids. Skull and upper cervical spine: Within limitations of motion degradation, no focal marrow lesion is identified. Sinuses/Orbits: Visualized orbits show no acute finding. Fluid within the posterior ethmoid air cells and sphenoid sinuses bilaterally. Right mastoid effusion. MRV HEAD FINDINGS Flow within the superior sagittal sinus has significantly improved since the prior MRV of 02/15/2020. However, there are persistent foci of nonocclusive thrombus within this vessel. Nonocclusive thrombus within the left transverse and proximal sigmoid dural venous sinuses has increased. Persistent nonocclusive thrombus within the distal right transverse sinus. Persistent near occlusive or occlusive thrombus within the right sigmoid sinus and visualized right internal jugular vein. The degree of flow within the deep venous system is unchanged. The veins of [place] and a few additional cerebral veins overlying the bilateral frontoparietal convexities remain thrombosed (best  appreciated on the axial precontrast T1 weighted sequence). MRV impression #2 will be called to the ordering clinician or representative by the Radiologist Assistant,  and communication documented in the PACS or [employer]. IMPRESSION: MRI brain: 1. Motion degraded exam. 2. 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 3. Restricted diffusion at site of an acute/subacute hemorrhagic infarct within the right callosal splenium has subtly increased in extent. 4. T2/FLAIR hyperintensity within the deep gray nuclei persists but has diminished. However, there may be a new punctate acute infarct within the left thalamocapsular junction. 5. 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 6. Persistent trace subarachnoid hemorrhage along the left frontoparietal convexity. MRV head: 1. Flow within the superior sagittal sinus has significantly improved since the prior MRV of 02/15/2020. However, there are persistent foci of nonocclusive thrombus within this vessel. 2. Nonocclusive thrombus within the left transverse and proximal sigmoid dural venous sinuses has increased. 3. Persistent nonocclusive thrombus within the right transverse sinus. 4. Persistent near occlusive or occlusive thrombus within the right sigmoid sinus and visualized right internal jugular vein. 5. The degree of flow within the deep venous system is unchanged. 6. The veins of [place]rolard and a few additional cerebral veins overlying the bilateral frontoparietal convexities remain thrombosed. Electronically Signed   By: Kellie Simmering DO   On: 02/18/2020 11:38   MR BRAIN WO CONTRAST  Result Date: 02/15/2020 CLINICAL DATA:  Follow-up dural sinus thrombosis. Interval heparin drip and mechanical thrombectomy. EXAM: MRI HEAD WITHOUT CONTRAST MRV HEAD WITHOUT CONTRAST TECHNIQUE: Multiplanar, multiecho pulse sequences of the brain and surrounding structures were obtained without intravenous contrast. Angiographic images of the intracranial venous structures were obtained using MRV  technique without intravenous contrast. COMPARISON:  Brain MRI from 3 days ago FINDINGS: MRI HEAD WITHOUT CONTRAST Brain: Bilateral deep watershed white matter infarcts which appears similar in extent. The degree of restricted diffusion appears greater in these infarcts, compatible with interval evolution. Venous infarct along the high left central sulcus is progressed in extent. Small infarct in the right paramedian splenium of the corpus callosum with mild increase. T2 signal without restricted diffusion in the right more than left basal ganglia and in the bilateral thalamus, progressed. There is increased FLAIR signal along the cerebral convexities which may be from engorged vessels or the subarachnoid blood seen on admission CT. On gradient imaging there is less vessel congestion suspected. Increased petechial hemorrhage along the left-sided venous infarct. Vascular: T1 hyperintense evolution of the dural venous sinus thrombosis which is most confluent in the superior sagittal sinus, right transverse sinus, and right sigmoid sinus. Clot is also seen in the bilateral vein of [place]rolard. Skull and upper cervical spine: Normal marrow signal Sinuses/Orbits: Negative MR VENOGRAM WITHOUT CONTRAST There has been improvement of venous flow after thrombectomy. Improvements are primarily seen at the level of the torcula and proximal right transverse sinus. There is also now straight sinus and internal cerebral vein flow. Occlusive thrombus is still seen at the level of the superior sagittal sinus, distal right transverse, and right sigmoid sinuses. Veins of Trolard remain thrombosed, best seen by noncontrast T1 weighted imaging. IMPRESSION: Brain MRI: 1. Progressive left perirolandic venous infarction and petechial hemorrhage. 2. New edema in the deep gray nuclei without infarction. Intracranial MRV: 1. Improved flow especially in the deep venous system. Improved flow also seen in the proximal right transverse sinus. 2.  Occlusive clot still seen in  the superior sagittal, distal right transverse, and right sigmoid dural sinuses. No recanalization of the veins of Trolard. Electronically Signed   By: Monte Fantasia M.D.   On: 02/15/2020 05:10   MR BRAIN WO CONTRAST  Result Date: 02/12/2020 CLINICAL DATA:  Mental status change with unknown cause. EXAM: MRI HEAD WITHOUT CONTRAST MRV HEAD WITHOUT CONTRAST TECHNIQUE: Multiplanar, multiecho pulse sequences of the brain and surrounding structures were obtained without intravenous contrast. Angiographic images of the intracranial venous structures were obtained using MRV technique without intravenous contrast. COMPARISON:  Head CT from earlier today FINDINGS: MRI HEAD WITHOUT CONTRAST Brain: Small patchy acute infarcts in the bilateral cerebral white matter and along the high left sylvian fissure where there is likely accentuation by susceptibility artifact. These are in a deep watershed pattern. Limited sulci, even for age which likely reflects diffuse swelling. Focal swelling is seen to a greater extent than the infarction along the high perirolandic cortex, left more than right. Small volume subarachnoid hemorrhage as seen on head CT, mainly at the areas of greatest swelling. There is marked intravascular susceptibility signal involving the right more than left cortical, dural sinus, and deep veins due to intravascular deoxygenation/slow flow. No hydrocephalus. Vascular: Dural venous sinus thrombosis with further description below. Skull and upper cervical spine: No focal marrow signal abnormality Sinuses/Orbits: Negative MR VENOGRAM WITHOUT CONTRAST Abrupt occlusion of the mid superior sagittal sinus with confluent thrombus extending to the torcula, right transverse, right sigmoid, and right upper internal jugular vein. Bilateral vein of Trolard thrombosis. The straight sinus and vein of [place] are also non flowing. No internal cerebral flow on either side. Critical Value/emergent  results were called by telephone at the time of interpretation on 02/12/2020 at 6:49 am to provider Veryl Speak , who verbally acknowledged these results. IMPRESSION: 1. Extensive, acute dural venous sinus thrombosis involving the superior sagittal sinus into the right internal jugular vein via the dominant right transverse and sigmoid system. Both veins of Trolard are thrombosed and there is straight sinus/deep cerebral thrombosis as well. 2. Deep watershed white matter infarcts, small volume subarachnoid hemorrhage, and left more than right frontoparietal cortical swelling due to #1. Electronically Signed   By: Monte Fantasia M.D.   On: 02/12/2020 06:50   MR Venogram Head  Result Date: 02/15/2020 CLINICAL DATA:  Follow-up dural sinus thrombosis. Interval heparin drip and mechanical thrombectomy. EXAM: MRI HEAD WITHOUT CONTRAST MRV HEAD WITHOUT CONTRAST TECHNIQUE: Multiplanar, multiecho pulse sequences of the brain and surrounding structures were obtained without intravenous contrast. Angiographic images of the intracranial venous structures were obtained using MRV technique without intravenous contrast. COMPARISON:  Brain MRI from 3 days ago FINDINGS: MRI HEAD WITHOUT CONTRAST Brain: Bilateral deep watershed white matter infarcts which appears similar in extent. The degree of restricted diffusion appears greater in these infarcts, compatible with interval evolution. Venous infarct along the high left central sulcus is progressed in extent. Small infarct in the right paramedian splenium of the corpus callosum with mild increase. T2 signal without restricted diffusion in the right more than left basal ganglia and in the bilateral thalamus, progressed. There is increased FLAIR signal along the cerebral convexities which may be from engorged vessels or the subarachnoid blood seen on admission CT. On gradient imaging there is less vessel congestion suspected. Increased petechial hemorrhage along the left-sided  venous infarct. Vascular: T1 hyperintense evolution of the dural venous sinus thrombosis which is most confluent in the superior sagittal sinus, right transverse sinus, and right sigmoid sinus. Clot is  also seen in the bilateral vein of Trolard. Skull and upper cervical spine: Normal marrow signal Sinuses/Orbits: Negative MR VENOGRAM WITHOUT CONTRAST There has been improvement of venous flow after thrombectomy. Improvements are primarily seen at the level of the torcula and proximal right transverse sinus. There is also now straight sinus and internal cerebral vein flow. Occlusive thrombus is still seen at the level of the superior sagittal sinus, distal right transverse, and right sigmoid sinuses. Veins of Trolard remain thrombosed, best seen by noncontrast T1 weighted imaging. IMPRESSION: Brain MRI: 1. Progressive left perirolandic venous infarction and petechial hemorrhage. 2. New edema in the deep gray nuclei without infarction. Intracranial MRV: 1. Improved flow especially in the deep venous system. Improved flow also seen in the proximal right transverse sinus. 2. Occlusive clot still seen in the superior sagittal, distal right transverse, and right sigmoid dural sinuses. No recanalization of the veins of Trolard. Electronically Signed   By: Monte Fantasia M.D.   On: 02/15/2020 05:10   MR Venogram Head  Result Date: 02/12/2020 CLINICAL DATA:  Mental status change with unknown cause. EXAM: MRI HEAD WITHOUT CONTRAST MRV HEAD WITHOUT CONTRAST TECHNIQUE: Multiplanar, multiecho pulse sequences of the brain and surrounding structures were obtained without intravenous contrast. Angiographic images of the intracranial venous structures were obtained using MRV technique without intravenous contrast. COMPARISON:  Head CT from earlier today FINDINGS: MRI HEAD WITHOUT CONTRAST Brain: Small patchy acute infarcts in the bilateral cerebral white matter and along the high left sylvian fissure where there is likely  accentuation by susceptibility artifact. These are in a deep watershed pattern. Limited sulci, even for age which likely reflects diffuse swelling. Focal swelling is seen to a greater extent than the infarction along the high perirolandic cortex, left more than right. Small volume subarachnoid hemorrhage as seen on head CT, mainly at the areas of greatest swelling. There is marked intravascular susceptibility signal involving the right more than left cortical, dural sinus, and deep veins due to intravascular deoxygenation/slow flow. No hydrocephalus. Vascular: Dural venous sinus thrombosis with further description below. Skull and upper cervical spine: No focal marrow signal abnormality Sinuses/Orbits: Negative MR VENOGRAM WITHOUT CONTRAST Abrupt occlusion of the mid superior sagittal sinus with confluent thrombus extending to the torcula, right transverse, right sigmoid, and right upper internal jugular vein. Bilateral vein of [place]rolard thrombosis. The straight sinus and vein of Galen are also non flowing. No internal cerebral flow on either side. Critical Value/emergent results were called by telephone at the time of interpretation on 02/12/2020 at 6:49 am to provider Veryl Speak , who verbally acknowledged these results. IMPRESSION: 1. Extensive, acute dural venous sinus thrombosis involving the superior sagittal sinus into the right internal jugular vein via the dominant right transverse and sigmoid system. Both veins of Trolard are thrombosed and there is straight sinus/deep cerebral thrombosis as well. 2. Deep watershed white matter infarcts, small volume subarachnoid hemorrhage, and left more than right frontoparietal cortical swelling due to #1. Electronically Signed   By: Monte Fantasia M.D.   On: 02/12/2020 06:50   IR THROMBECT VENO [facility] MOD SED  Result Date: 02/18/2020 INDICATION: Stuporous.  Unresponsive.  Occluded dural venous sinuses. EXAM: 1. EMERGENT LARGE VESSEL OCCLUSION THROMBOLYSIS (POSTERIOR  CIRCULATION) COMPARISON:  MRI brain February 12, 2020. MRI venogram of February 12, 2020. MEDICATIONS: Ancef 2 g IV antibiotic was administered within 1 hour of the procedure. ANESTHESIA/SEDATION: General anesthesia CONTRAST:  Isovue 300 approximately 200 mL. FLUOROSCOPY TIME:  Fluoroscopy Time: 138 minutes 24 seconds (3981  mGy). COMPLICATIONS: None immediate. TECHNIQUE: Following a full explanation of the procedure along with the potential associated complications, an informed witnessed consent was obtained from the patient's mother. The risks of intracranial hemorrhage of 10%, worsening neurological deficit, ventilator dependency, death and inability to revascularize were all reviewed in detail with the patient's mother. The patient was then put under general anesthesia by the Department of Anesthesiology at [facility]. The right groin was prepped and draped in the usual sterile fashion. Thereafter using modified Seldinger technique, transfemoral access into the right common femoral vein was obtained without difficulty. Over a 0.035 inch guidewire a 5 French Pinnacle sheath was inserted. Through this, and also over a 0.035 inch guidewire a 5 [place] JB 1 catheter was advanced and positioned in the left internal jugular vein to the cranial skull base. The guidewire was removed. Good aspiration was obtained from hub of the diagnostic catheter. A gentle control arteriogram confirmed safe position of tip of the JB 1 diagnostic catheter. Over a 0.035 inch 300 cm Rosen exchange guidewire, an 8 [place] Neuron Max sheath was then inserted after removal of the 5 [place] sheath. The tip of the Neuron Max guide sheath was positioned in the internal jugular vein at the skull base. This was then connected to continuous heparinized saline infusion. Through the left groin, access into the left common femoral artery obtained over a 0.018 inch micro guidewire and a micropuncture set. Over a 0.035 inch J guidewire, a 5 [place]  JB 1 catheter was advanced to the aortic arch region, and selectively positioned in the right common carotid artery, the right vertebral artery and the left common carotid artery. Finally the tip of the diagnostic catheter was positioned in the proximal left internal carotid artery and connected to continuous heparinized saline infusion. FINDINGS: The right common carotid arteriogram demonstrates the right external carotid artery and its major branches to be widely patent. The right internal carotid artery at the bulb to the cranial skull base opacifies widely. The petrous, cavernous and supraclinoid segments are widely patent. The right middle cerebral artery and the right anterior cerebral artery opacify into the capillary phase. The delayed venous phase demonstrates engorged cortical veins in the entirety of the right cerebral hemisphere with stagnation of flow in the parasagittal venous systems of the posterior 2/3 of the superior sagittal is noted. No significant opacification is noted in the transverse sinuses or sigmoid sinuses or marginal sinuses. Prominence of the vein of Labbe is seen projecting into a laterally positioned engorged vein running parallel to the occluded internal jugular vein. The right vertebral artery origin is widely patent. Vessel is seen to opacify normally to the cranial skull base. Patency is maintained of the right posterior-inferior cerebral artery and the right vertebrobasilar junction. The posterior cerebral arteries, the superior cerebellar arteries and the anterior-inferior cerebellar arteries opacify into the capillary and venous phases. The venous phase demonstrates opacification of the distal 2/3 of the left transverse sinus, the left sigmoid sinus and the left internal jugular vein. There is no opacification of the torcula or the inferior sagittal sinus, or the right transverse or right sigmoid sinus. The left common carotid arteriogram demonstrates the left external carotid  artery and its major branches to be widely patent. The left internal carotid artery at the bulb to the cranial skull base is widely patent. The petrous, cavernous and supraclinoid segments are functional. The left middle cerebral artery and the left anterior cerebral artery opacify into the capillary phase with delayed  exit of contrast in the capillary phase with attenuated caliber of the cortical veins. No opacification is seen of the distal 2/3 of the superior sagittal sinus, the torcula or the right-sided dural venous sinuses. There was no significant opacification seen of the left transverse sinus either. However, there is opacification of vein of the Labbe, and the posterior temporal vein which project into the left transverse sinus sigmoid sinus junction. Patency of the left sigmoid sinus is noted. Lateral to this vein projecting parallel to the internal jugular vein. PROCEDURE: Arterial injection through the left common carotid artery into the venous phase were utilized for venous road maps. Over a 0.035 inch Roadrunner guidewire a 105 cm 071 Benchmark guide catheter was advanced to the distal end of the 8 [place] Neuron Max sheath. The 035 inch Roadrunner guidewire was easily advanced through the left internal jugular bulb, the left sigmoid sinus, the left transverse sinus and into mid right transverse sinus followed by the Benchmark catheter. The guidewire was removed. Resistance to aspiration was noted. Through the [place] guide sheath, over a 0.014 inch standard Synchro micro guidewire, with a J configuration a Marksman 027 microcatheter was then advanced without difficulty to the right transverse sinus sigmoid sinus junction. Using a torque device the micro guidewire was eventually advanced into the distal right sigmoid sinus at the junction with the internal jugular bulb followed by the microcatheter. The guidewire was removed. Safe position of the tip of the microcatheter was confirmed. A 6 mm x 45 mm  Solitaire X retrieval device was then advanced to the distal end of the microcatheter and deployed in the usual manner by unsheathing the microcatheter. Constant aspiration was then applied at the hub of the 6 [place] guide catheter now positioned in the distal right transverse sinus as gradually the retrieval device was retrieved into the guide sheath and removed. The guide sheath was also retrieved more proximally into the left sigmoid sinus. Copious amounts of clot were noted in the aspirate, and also on the stent retriever. Multiple passes were then made again as described above with access obtained into the right sigmoid sinus and the right internal jugular vein followed by deployment of the 6 x 45 retriever. This was then followed by slow aspiration at the hub of the Benchmark catheter which continued to retrieve into the left sigmoid sinus. Again 3 or 4 passes were made with copious amounts of clot retrieved into the canister and also in the aspirate, and in the retrieval device. Eventually arterial injection in the venous phase demonstrated significantly improved caliber of the distal right transverse sinus and the left transverse sinus. A control arteriogram performed now demonstrates significant decrease in the stagnation of flow in the cortical veins and the left cerebral hemisphere in general. Vein of Trolard was now noted to be filling though with nonocclusive filling defects consistent with clot. No significant improvement was noted in the superior sagittal sinus. Flow was also noted to be more brisk in the vein of Labbe and the left posterior temporal vein. Control arteriograms also demonstrated flow into the now recanalized right transverse sinus and to the left transverse sinus and egress into the left sigmoid sinus and left internal jugular vein. Despite multiple attempts with the microcatheter and retriever in the right sigmoid sinus, no significant opacification was realized despite removal of  clot. Advancement at the right sigmoid sinus transverse sinus junction was met with significant resistance raising the possibly of an underlying severe transverse sinus sigmoid sinus stenosis. Multiple  attempts were also made to access the superior sagittal sinus through the torcula which was met with significant resistance. Multiple attempts were made to access the superior sagittal sinus, and the torcula. Access was obtained into the straight sinus, and with retriever and aspiration clot was retrieved from this site. In view of the amount of contrast utilized, and the restoration of antegrade flow in the right transverse sinus, the left transverse sinus sigmoid sinus, and significantly improved flow through the left cerebral hemisphere, the procedure was stopped. A CT of the brain demonstrated no evidence of intracranial hemorrhages, or mass effect. There appeared to be generalized improvement in the left cerebral hemispheric swelling. 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 On the CT also noted was the posterior cerebral veins, the vein of Galen and the straight sinus. The 8 [place] Neuron Max was removed and replaced with a short 8 [place] Pinnacle sheath which was connected to continuous heparinized saline infusion. The 5 French left common femoral artery sheath was left in-situ, and be readily available in case of a second treatment. Patient was left intubated and returned to the floor in stable neurologic condition. IMPRESSION: Status post endovascular revascularization of occluded right transverse sinus, left transverse sinus and partially the right sigmoid sinus with significant improved flow and decompression of the left cerebral hemispheric venous system. Partial recanalization of the vein of Trolard noted. PLAN: Follow patient clinically for signs of decrease in intracranial pressure, increased improvement and improved sensorium. Probable repeat MRI of the brain and  MRA of the brain in a day or two. Continue with IV heparin treatment. Electronically Signed   By: Luanne Bras M.D.   On: 02/17/2020 10:40   IR THROMBECT VENO MECH REPT MOD SED  Result Date: 02/18/2020 INDICATION: Stuporous.  Dural venous sinus thrombosis. EXAM: 1. EMERGENT LARGE VESSEL OCCLUSION THROMBOLYSIS (POSTERIOR CIRCULATION) COMPARISON:  MRI MRV of February 15, 2020. MEDICATIONS: Ancef 2 g IV antibiotic was administered within 1 hour of the procedure. ANESTHESIA/SEDATION: General anesthesia CONTRAST:  Isovue 300 100 mL FLUOROSCOPY TIME:  Fluoroscopy Time: 96 minutes 12 seconds (3924 mGy). COMPLICATIONS: None immediate. TECHNIQUE: Following a full explanation of the procedure along with the potential associated complications, an informed witnessed consent was obtained. The risks of intracranial hemorrhage of 10%, worsening neurological deficit, ventilator dependency, death and inability to revascularize were all reviewed in detail with the patient's mother. The patient was then put under general anesthesia by the Department of Anesthesiology at [facility]. The in situ right common femoral vein 8 [place], and in situ left common femoral arterial French sheath were exchanged over a 0.035 inch guidewire for a new sheath which were then connected to continuous heparinized saline infusion. Over a 0.035 inch Roadrunner guidewire, a 5 [place] JB 1 catheter was advanced to the aortic arch region and positioned in the right common carotid artery. Through the right common femoral vein, over a 0.035 inch Roadrunner guidewire, a 5 French catheter was administered to the right internal jugular vein to the skull base. This in turn was then exchanged over a 0.035 inch 300 cm Constance Holster exchange guidewire for an 8 French 80 cm Neuron Max sheath. The guidewire was removed. Good aspiration obtained from the hub of the Neuron Max sheath. A gentle control arteriogram performed through this demonstrates safe position  of the tip of the Neuron Max sheath. This was then connected to continuous heparinized saline infusion. FINDINGS: The right common carotid arteriogram demonstrates the right external  carotid artery and its major branches to be widely patent. The right internal carotid artery at the bulb to the cranial skull base is widely patent. The petrous, cavernous and supraclinoid segments are also widely patent. The right middle cerebral artery and the right anterior cerebral opacify into the capillary and venous phases. The venous phase demonstrates again significantly decreased venous congestion overlying both cerebral convexities. Patency is noted of the right transverse sinus to the right transverse sinus sigmoid sinus junction. This vein of Labbe and the posterior temporal vein are patent draining into venous channel running parallel to the to the occluded right internal jugular vein. Patency is maintained at the torcula, the left transverse sinus, the left sigmoid sinus and the left internal jugular vein. PROCEDURE: An 071 95 cm Benchmark catheter was advanced through the Neuron Max guide sheath to the left internal jugular occluded vein. An 035 inch Roadrunner wire was first advanced without difficulty into the right sigmoid sinus and the distal right transverse sinus. However, advancement of the 6 [place] Benchmark was met with significant resistance. At this time, in a coaxial manner and with constant heparinized saline infusion over a 016 inch Fathom micro guidewire, a Marksman 027 microcatheter was advanced without difficulty to the torcula. The guidewire was removed. Control arteriogram was then performed from the proximal right transverse sinus. This confirmed brisk flow through the right transverse and left transverse sinuses. This also demonstrated the partially occluded on the torcula. The 016 micro guidewire was reintroduced, and the torque device was advanced without difficulty into the distal 1/3 of the  superior sagittal sinus and eventually into the junction of the anterior and the middle 1/3. Micro guidewire was removed. Good aspiration was obtained from the hub of the microcatheter. A gentle control arteriogram performed through this demonstrated extensive occlusion of the anterior 2/3 of the superior sagittal sinus. Through the microcatheter, a 6 mm x 40 mm Solitaire X retrieval device was advanced and deployed through the microcatheter. The microcatheter was then gently retrieved. The 6 [place] Benchmark guide catheter was then advanced to just inside the torcula having the hub at the right groin. At this time as constant aspiration was applied through the Benchmark, the retrieval device and the microcatheter were retrieved and removed. Copious amounts of clot were seen in the canister, and also in the retrieval device. Three further passes were made with a similar arrangement resulting in more clot being removed. A final pass was then made through a 071 136 cm Zoom aspiration catheter which replaced the Benchmark catheter. This could be advanced more distally to the anterior third of the superior sagittal sinus. Constant aspiration was applied as the aspiration catheter was retrieved slowly more proximally and into the right transverse sinus. Again more clot was obtained especially in the canister. A control arteriogram performed through the diagnostic catheter in the right common carotid artery into the venous phase now demonstrated complete recanalization of the superior sagittal sinus, the right transverse sinus, the left transverse sinus, the left sigmoid sinus and the left internal jugular vein. Gentle control injection performed through the Zoom aspiration catheter in the mid right transverse sinus demonstrated significant stenosis of the right transverse sinus sigmoid sinus junction. Also noted was severely contracted right sigmoid sinus to the internal jugular vein. At this time, a 8 mm x 40 mm  sterling balloon angioplasty catheter was advanced over a 0.014 inch exchange micro guidewire. Balloon angioplasty was then performed extending from the distal 1/3 of the transverse sinus  on the right side, the transverse sinus sigmoid sinus junction, and the sigmoid sinus. At the end of this, a control arteriogram performed in the venous phase demonstrated marginally improved flow through the right sigmoid sinus into the now patent right internal jugular vein. However, there continued be prominent collateral just lateral to this at the level of the transverse sinus sigmoid sinus junction draining the posterior temporal vein, and the vein of Labbe. There continued be stenosis at the right transverse sinus sigmoid sinus junction partially improved. As venous grafts continued through superior sagittal sinus and transverse sinuses and the right sigmoid sinus and through a prominent collateral just into the right sigmoid sinus, it was elected to stop. 071 Zoom aspiration catheter, and the Neuron Max sheath were removed with hemostasis achieved at the right groin venous puncture site. The left femoral sheath was then removed and hemostasis achieved with a 5 [place] ExoSeal closure device. Distal pulses remained palpable. A CT of the brain demonstrated no gross subarachnoid hemorrhage or mass effect or midline shift. The patient was left intubated and transferred to the neuro ICU. IMPRESSION: Status post endovascular complete revascularization of entire superior sagittal sinus, the right transverse sinus, the left transverse sinus, the left sigmoid sinus, the left internal jugular vein, and partially the right sigmoid sinus and right internal jugular vein using the aspiration, and stent retrieval technique as described. PLAN: Follow-up in the clinic 4 to 6 weeks post discharge. Electronically Signed   By: Luanne Bras M.D.   On: 02/17/2020 13:36   IR CT Head Ltd  Result Date: 02/18/2020 INDICATION: Stuporous.   Dural venous sinus thrombosis. EXAM: 1. EMERGENT LARGE VESSEL OCCLUSION THROMBOLYSIS (POSTERIOR CIRCULATION) COMPARISON:  MRI MRV of February 15, 2020. MEDICATIONS: Ancef 2 g IV antibiotic was administered within 1 hour of the procedure. ANESTHESIA/SEDATION: General anesthesia CONTRAST:  Isovue 300 100 mL FLUOROSCOPY TIME:  Fluoroscopy Time: 96 minutes 12 seconds (3924 mGy). COMPLICATIONS: None immediate. TECHNIQUE: Following a full explanation of the procedure along with the potential associated complications, an informed witnessed consent was obtained. The risks of intracranial hemorrhage of 10%, worsening neurological deficit, ventilator dependency, death and inability to revascularize were all reviewed in detail with the patient's mother. The patient was then put under general anesthesia by the Department of Anesthesiology at [facility]. The in situ right common femoral vein 8 [place], and in situ left common femoral arterial French sheath were exchanged over a 0.035 inch guidewire for a new sheath which were then connected to continuous heparinized saline infusion. Over a 0.035 inch Roadrunner guidewire, a 5 [place] JB 1 catheter was advanced to the aortic arch region and positioned in the right common carotid artery. Through the right common femoral vein, over a 0.035 inch Roadrunner guidewire, a 5 French catheter was administered to the right internal jugular vein to the skull base. This in turn was then exchanged over a 0.035 inch 300 cm Constance Holster exchange guidewire for an 8 French 80 cm Neuron Max sheath. The guidewire was removed. Good aspiration obtained from the hub of the Neuron Max sheath. A gentle control arteriogram performed through this demonstrates safe position of the tip of the Neuron Max sheath. This was then connected to continuous heparinized saline infusion. FINDINGS: The right common carotid arteriogram demonstrates the right external carotid artery and its major branches to be widely  patent. The right internal carotid artery at the bulb to the cranial skull base is widely patent. The petrous, cavernous and supraclinoid segments are  also widely patent. The right middle cerebral artery and the right anterior cerebral opacify into the capillary and venous phases. The venous phase demonstrates again significantly decreased venous congestion overlying both cerebral convexities. Patency is noted of the right transverse sinus to the right transverse sinus sigmoid sinus junction. This vein of Labbe and the posterior temporal vein are patent draining into venous channel running parallel to the to the occluded right internal jugular vein. Patency is maintained at the torcula, the left transverse sinus, the left sigmoid sinus and the left internal jugular vein. PROCEDURE: An 071 95 cm Benchmark catheter was advanced through the Neuron Max guide sheath to the left internal jugular occluded vein. An 035 inch Roadrunner wire was first advanced without difficulty into the right sigmoid sinus and the distal right transverse sinus. However, advancement of the 6 [place] Benchmark was met with significant resistance. At this time, in a coaxial manner and with constant heparinized saline infusion over a 016 inch Fathom micro guidewire, a Marksman 027 microcatheter was advanced without difficulty to the torcula. The guidewire was removed. Control arteriogram was then performed from the proximal right transverse sinus. This confirmed brisk flow through the right transverse and left transverse sinuses. This also demonstrated the partially occluded on the torcula. The 016 micro guidewire was reintroduced, and the torque device was advanced without difficulty into the distal 1/3 of the superior sagittal sinus and eventually into the junction of the anterior and the middle 1/3. Micro guidewire was removed. Good aspiration was obtained from the hub of the microcatheter. A gentle control arteriogram performed through this  demonstrated extensive occlusion of the anterior 2/3 of the superior sagittal sinus. Through the microcatheter, a 6 mm x 40 mm Solitaire X retrieval device was advanced and deployed through the microcatheter. The microcatheter was then gently retrieved. The 6 [place] Benchmark guide catheter was then advanced to just inside the torcula having the hub at the right groin. At this time as constant aspiration was applied through the Benchmark, the retrieval device and the microcatheter were retrieved and removed. Copious amounts of clot were seen in the canister, and also in the retrieval device. Three further passes were made with a similar arrangement resulting in more clot being removed. A final pass was then made through a 071 136 cm Zoom aspiration catheter which replaced the Benchmark catheter. This could be advanced more distally to the anterior third of the superior sagittal sinus. Constant aspiration was applied as the aspiration catheter was retrieved slowly more proximally and into the right transverse sinus. Again more clot was obtained especially in the canister. A control arteriogram performed through the diagnostic catheter in the right common carotid artery into the venous phase now demonstrated complete recanalization of the superior sagittal sinus, the right transverse sinus, the left transverse sinus, the left sigmoid sinus and the left internal jugular vein. Gentle control injection performed through the Zoom aspiration catheter in the mid right transverse sinus demonstrated significant stenosis of the right transverse sinus sigmoid sinus junction. Also noted was severely contracted right sigmoid sinus to the internal jugular vein. At this time, a 8 mm x 40 mm sterling balloon angioplasty catheter was advanced over a 0.014 inch exchange micro guidewire. Balloon angioplasty was then performed extending from the distal 1/3 of the transverse sinus on the right side, the transverse sinus sigmoid sinus  junction, and the sigmoid sinus. At the end of this, a control arteriogram performed in the venous phase demonstrated marginally improved flow through the  right sigmoid sinus into the now patent right internal jugular vein. However, there continued be prominent collateral just lateral to this at the level of the transverse sinus sigmoid sinus junction draining the posterior temporal vein, and the vein of Labbe. There continued be stenosis at the right transverse sinus sigmoid sinus junction partially improved. As venous grafts continued through superior sagittal sinus and transverse sinuses and the right sigmoid sinus and through a prominent collateral just into the right sigmoid sinus, it was elected to stop. 071 Zoom aspiration catheter, and the Neuron Max sheath were removed with hemostasis achieved at the right groin venous puncture site. The left femoral sheath was then removed and hemostasis achieved with a 5 [place] ExoSeal closure device. Distal pulses remained palpable. A CT of the brain demonstrated no gross subarachnoid hemorrhage or mass effect or midline shift. The patient was left intubated and transferred to the neuro ICU. IMPRESSION: Status post endovascular complete revascularization of entire superior sagittal sinus, the right transverse sinus, the left transverse sinus, the left sigmoid sinus, the left internal jugular vein, and partially the right sigmoid sinus and right internal jugular vein using the aspiration, and stent retrieval technique as described. PLAN: Follow-up in the clinic 4 to 6 weeks post discharge. Electronically Signed   By: Luanne Bras M.D.   On: 02/17/2020 13:36   IR CT Head Ltd  Result Date: 02/18/2020 INDICATION: Stuporous.  Unresponsive.  Occluded dural venous sinuses. EXAM: 1. EMERGENT LARGE VESSEL OCCLUSION THROMBOLYSIS (POSTERIOR CIRCULATION) COMPARISON:  MRI brain February 12, 2020. MRI venogram of February 12, 2020. MEDICATIONS: Ancef 2 g IV antibiotic was  administered within 1 hour of the procedure. ANESTHESIA/SEDATION: General anesthesia CONTRAST:  Isovue 300 approximately 200 mL. FLUOROSCOPY TIME:  Fluoroscopy Time: 138 minutes 24 seconds (3981 mGy). COMPLICATIONS: None immediate. TECHNIQUE: Following a full explanation of the procedure along with the potential associated complications, an informed witnessed consent was obtained from the patient's mother. The risks of intracranial hemorrhage of 10%, worsening neurological deficit, ventilator dependency, death and inability to revascularize were all reviewed in detail with the patient's mother. The patient was then put under general anesthesia by the Department of Anesthesiology at [facility]. The right groin was prepped and draped in the usual sterile fashion. Thereafter using modified Seldinger technique, transfemoral access into the right common femoral vein was obtained without difficulty. Over a 0.035 inch guidewire a 5 French Pinnacle sheath was inserted. Through this, and also over a 0.035 inch guidewire a 5 [place] JB 1 catheter was advanced and positioned in the left internal jugular vein to the cranial skull base. The guidewire was removed. Good aspiration was obtained from hub of the diagnostic catheter. A gentle control arteriogram confirmed safe position of tip of the JB 1 diagnostic catheter. Over a 0.035 inch 300 cm Rosen exchange guidewire, an 8 [place] Neuron Max sheath was then inserted after removal of the 5 [place] sheath. The tip of the Neuron Max guide sheath was positioned in the internal jugular vein at the skull base. This was then connected to continuous heparinized saline infusion. Through the left groin, access into the left common femoral artery obtained over a 0.018 inch micro guidewire and a micropuncture set. Over a 0.035 inch J guidewire, a 5 [place] JB 1 catheter was advanced to the aortic arch region, and selectively positioned in the right common carotid artery, the right  vertebral artery and the left common carotid artery. Finally the tip of the diagnostic catheter was positioned in  the proximal left internal carotid artery and connected to continuous heparinized saline infusion. FINDINGS: The right common carotid arteriogram demonstrates the right external carotid artery and its major branches to be widely patent. The right internal carotid artery at the bulb to the cranial skull base opacifies widely. The petrous, cavernous and supraclinoid segments are widely patent. The right middle cerebral artery and the right anterior cerebral artery opacify into the capillary phase. The delayed venous phase demonstrates engorged cortical veins in the entirety of the right cerebral hemisphere with stagnation of flow in the parasagittal venous systems of the posterior 2/3 of the superior sagittal is noted. No significant opacification is noted in the transverse sinuses or sigmoid sinuses or marginal sinuses. Prominence of the vein of Labbe is seen projecting into a laterally positioned engorged vein running parallel to the occluded internal jugular vein. The right vertebral artery origin is widely patent. Vessel is seen to opacify normally to the cranial skull base. Patency is maintained of the right posterior-inferior cerebral artery and the right vertebrobasilar junction. The posterior cerebral arteries, the superior cerebellar arteries and the anterior-inferior cerebellar arteries opacify into the capillary and venous phases. The venous phase demonstrates opacification of the distal 2/3 of the left transverse sinus, the left sigmoid sinus and the left internal jugular vein. There is no opacification of the torcula or the inferior sagittal sinus, or the right transverse or right sigmoid sinus. The left common carotid arteriogram demonstrates the left external carotid artery and its major branches to be widely patent. The left internal carotid artery at the bulb to the cranial skull base is  widely patent. The petrous, cavernous and supraclinoid segments are functional. The left middle cerebral artery and the left anterior cerebral artery opacify into the capillary phase with delayed exit of contrast in the capillary phase with attenuated caliber of the cortical veins. No opacification is seen of the distal 2/3 of the superior sagittal sinus, the torcula or the right-sided dural venous sinuses. There was no significant opacification seen of the left transverse sinus either. However, there is opacification of vein of the Labbe, and the posterior temporal vein which project into the left transverse sinus sigmoid sinus junction. Patency of the left sigmoid sinus is noted. Lateral to this vein projecting parallel to the internal jugular vein. PROCEDURE: Arterial injection through the left common carotid artery into the venous phase were utilized for venous road maps. Over a 0.035 inch Roadrunner guidewire a 105 cm 071 Benchmark guide catheter was advanced to the distal end of the 8 [place] Neuron Max sheath. The 035 inch Roadrunner guidewire was easily advanced through the left internal jugular bulb, the left sigmoid sinus, the left transverse sinus and into mid right transverse sinus followed by the Benchmark catheter. The guidewire was removed. Resistance to aspiration was noted. Through the [facility] guide sheath, over a 0.014 inch standard Synchro micro guidewire, with a J configuration a Marksman 027 microcatheter was then advanced without difficulty to the right transverse sinus sigmoid sinus junction. Using a torque device the micro guidewire was eventually advanced into the distal right sigmoid sinus at the junction with the internal jugular bulb followed by the microcatheter. The guidewire was removed. Safe position of the tip of the microcatheter was confirmed. A 6 mm x 45 mm Solitaire X retrieval device was then advanced to the distal end of the microcatheter and deployed in the usual manner by  unsheathing the microcatheter. Constant aspiration was then applied at the hub of the 6  [place] guide catheter now positioned in the distal right transverse sinus as gradually the retrieval device was retrieved into the guide sheath and removed. The guide sheath was also retrieved more proximally into the left sigmoid sinus. Copious amounts of clot were noted in the aspirate, and also on the stent retriever. Multiple passes were then made again as described above with access obtained into the right sigmoid sinus and the right internal jugular vein followed by deployment of the 6 x 45 retriever. This was then followed by slow aspiration at the hub of the Benchmark catheter which continued to retrieve into the left sigmoid sinus. Again 3 or 4 passes were made with copious amounts of clot retrieved into the canister and also in the aspirate, and in the retrieval device. Eventually arterial injection in the venous phase demonstrated significantly improved caliber of the distal right transverse sinus and the left transverse sinus. A control arteriogram performed now demonstrates significant decrease in the stagnation of flow in the cortical veins and the left cerebral hemisphere in general. Vein of Trolard was now noted to be filling though with nonocclusive filling defects consistent with clot. No significant improvement was noted in the superior sagittal sinus. Flow was also noted to be more brisk in the vein of Labbe and the left posterior temporal vein. Control arteriograms also demonstrated flow into the now recanalized right transverse sinus and to the left transverse sinus and egress into the left sigmoid sinus and left internal jugular vein. Despite multiple attempts with the microcatheter and retriever in the right sigmoid sinus, no significant opacification was realized despite removal of clot. Advancement at the right sigmoid sinus transverse sinus junction was met with significant resistance raising the  possibly of an underlying severe transverse sinus sigmoid sinus stenosis. Multiple attempts were also made to access the superior sagittal sinus through the torcula which was met with significant resistance. Multiple attempts were made to access the superior sagittal sinus, and the torcula. Access was obtained into the straight sinus, and with retriever and aspiration clot was retrieved from this site. In view of the amount of contrast utilized, and the restoration of antegrade flow in the right transverse sinus, the left transverse sinus sigmoid sinus, and significantly improved flow through the left cerebral hemisphere, the procedure was stopped. A CT of the brain demonstrated no evidence of intracranial hemorrhages, or mass effect. There appeared to be generalized improvement in the left cerebral hemispheric swelling. 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 On the CT also noted was the posterior cerebral veins, the vein of Galen and the straight sinus. The 8 [place] Neuron Max was removed and replaced with a short 8 [place] Pinnacle sheath which was connected to continuous heparinized saline infusion. The 5 French left common femoral artery sheath was left in-situ, and be readily available in case of a second treatment. Patient was left intubated and returned to the floor in stable neurologic condition. IMPRESSION: Status post endovascular revascularization of occluded right transverse sinus, left transverse sinus and partially the right sigmoid sinus with significant improved flow and decompression of the left cerebral hemispheric venous system. Partial recanalization of the vein of Trolard noted. PLAN: Follow patient clinically for signs of decrease in intracranial pressure, increased improvement and improved sensorium. Probable repeat MRI of the brain and MRA of the brain in a day or two. Continue with IV heparin treatment. Electronically Signed   By: Luanne Bras  M.D.   On: 02/17/2020 10:40  DG Chest Port 1 View  Result Date: 02/18/2020 CLINICAL DATA:  Abnormal respirations. EXAM: PORTABLE CHEST 1 VIEW COMPARISON:  02/14/2020. FINDINGS: Interim extubation of NG tube. Right PICC line noted with tip over cavoatrial junction. Borderline cardiomegaly. Diffuse bilateral pulmonary infiltrates/edema. No pleural effusion or pneumothorax. IMPRESSION: 1. Interim extubation and removal of NG tube. Right PICC line noted with tip over cavoatrial junction. 2. Borderline cardiomegaly. 3. Diffuse bilateral pulmonary infiltrates/edema noted on today's exam. Electronically Signed   By: Marcello Moores  Register   On: 02/18/2020 05:36   DG CHEST PORT 1 VIEW  Result Date: 02/14/2020 CLINICAL DATA:  Endotracheal tube placement. EXAM: PORTABLE CHEST 1 VIEW COMPARISON:  One day prior FINDINGS: Endotracheal tube terminates 3.9 cm above carina. Nasogastric tube extends beyond the inferior aspect of the film. Right-sided PICC line is difficult to follow centrally. Most likely with tip at cavoatrial junction. Midline trachea. Normal heart size. No pleural effusion or pneumothorax. Improved left lower lobe aeration with mild bibasilar atelectasis remaining. IMPRESSION: Appropriate position of endotracheal tube. Bibasilar atelectasis with significantly improved left lower lobe aeration. Electronically Signed   By: Abigail Miyamoto M.D.   On: 02/14/2020 13:08   DG Chest Port 1 View  Result Date: 02/13/2020 CLINICAL DATA:  01/19/2014 EXAM: PORTABLE CHEST 1 VIEW COMPARISON:  Respiratory FINDINGS: Later endotracheal tube is seen with its tip just within the right mainstem bronchus. Nasogastric tube tip is seen within the proximal body of the stomach with its proximal side hole at the expected gastroesophageal junction. Retrocardiac opacity likely relates to partial left lower lobe collapse. Right lung is clear. No pneumothorax or pleural effusion. Cardiac size within normal limits. Pulmonary vascularity is  normal. IMPRESSION: Right mainstem intubation. Withdrawal of the endotracheal tube by approximately 2.5 cm may better position the catheter. Partial left lower lobe collapse. Nasogastric tube proximal side hole at the gastroesophageal junction. Advancement by roughly 5 cm may better position the catheter. These results will be called to the ordering clinician or representative by the Radiologist Assistant, and communication documented in the PACS or [employer]. Electronically Signed   By: Fidela Salisbury Clarke   On: 02/13/2020 23:35   DG Swallowing Func-Speech Pathology  Result Date: 02/24/2020 Objective Swallowing Evaluation: Type of Study: MBS-Modified Barium Swallow Study  Patient Details Name: Jill Clarke MRN: [id number] Date of Birth: Jun 13, 1985 Today's Date: 02/24/2020 Time: SLP Start Time (ACUTE ONLY): 38 -SLP Stop Time (ACUTE ONLY): 1050 SLP Time Calculation (min) (ACUTE ONLY): 30 min Past Medical History: Past Medical History: Diagnosis Date . Asthma   uses inhaler prn . Diabetes mellitus without complication ([place])  . Sickle cell trait ([place])  . Unspecified high-risk pregnancy 01/27/2011 Past Surgical History: Past Surgical History: Procedure Laterality Date . IR ANGIO INTRA EXTRACRAN SEL COM CAROTID INNOMINATE UNI R MOD SED  02/13/2020 . IR ANGIO INTRA EXTRACRAN SEL COM CAROTID INNOMINATE UNI R MOD SED  02/15/2020 . IR ANGIO INTRA EXTRACRAN SEL INTERNAL CAROTID UNI L MOD SED  02/13/2020 . IR ANGIO VERTEBRAL SEL VERTEBRAL UNI R MOD SED  02/13/2020 . IR CT HEAD LTD  02/13/2020 . IR CT HEAD LTD  02/15/2020 . IR PTA VENOUS EXCEPT DIALYSIS CIRCUIT  02/15/2020 . IR THROMBECT VENO MECH MOD SED  02/13/2020 . IR THROMBECT VENO MECH REPT MOD SED  02/15/2020 . RADIOLOGY WITH ANESTHESIA N/A 02/13/2020  Procedure: IR WITH ANESTHESIA;  Surgeon: Luanne Bras, Clarke;  Location: [place];  Service: Radiology;  Laterality: N/A; . RADIOLOGY WITH ANESTHESIA N/A 02/15/2020  Procedure:  IR WITH ANESTHESIA;  Surgeon: Radiologist,  Medication, Clarke;  Location: [place];  Service: Radiology;  Laterality: N/A; . TONSILLECTOMY   . WISDOM TOOTH EXTRACTION   HPI: 35 yo presented with AMS. MRI >  left frontoparietal hemorrhagic venous infarct (no significant changed 02/15/2020.),  hemorrhagic infarct within the right callosal, possible new punctate acute infarct within the left thalamocapsular junction, bilateral deep watershed white matter infarcts, persistent trace subarachnoid hemorrhage along the left  frontoparietal convexity. Intubated 1/20-1/25. CXR Diffuse bilateral pulmonary infiltrates/edema. PMH: DM, sickle cell trait, asthma.  Subjective: alert, participatory Assessment / Plan / Recommendation CHL IP CLINICAL IMPRESSIONS 02/24/2020 Clinical Impression Pt presented with mild oral dysphagia marked by piecemeal bolusing of solids into the pharynx.  Once bolus reached the valleculae/pyriforms, pharyngeal swallow was initiated with reliable laryngeal vestibule closure. There was no penetration/aspiration, even when taxed with large, successive boluses of thin and mixed thin/solid consistencies.  Pt had diffulty manipulating barium pill and transferring to posterior tongue - pill was manually removed.  Recommend advancing diet to regular solids, thin liquids; continue to crush meds.  Pt was excited about results of test. SLP Visit Diagnosis Dysphagia, oral phase (R13.11) Attention and concentration deficit following -- Frontal lobe and executive function deficit following -- Impact on safety and function No limitations   CHL IP TREATMENT RECOMMENDATION 02/24/2020 Treatment Recommendations Therapy as outlined in treatment plan below   Prognosis 02/19/2020 Prognosis for Safe Diet Advancement Good Barriers to Reach Goals Cognitive deficits;Severity of deficits Barriers/Prognosis Comment -- CHL IP DIET RECOMMENDATION 02/24/2020 SLP Diet Recommendations Regular solids;Thin liquid Liquid Administration via Cup;Straw Medication Administration Crushed with puree  Compensations Minimize environmental distractions;Monitor for anterior loss Postural Changes Seated upright at 90 degrees   CHL IP OTHER RECOMMENDATIONS 02/24/2020 Recommended Consults -- Oral Care Recommendations Oral care BID Other Recommendations --   CHL IP FOLLOW UP RECOMMENDATIONS 02/24/2020 Follow up Recommendations Inpatient Rehab   CHL IP FREQUENCY AND DURATION 02/24/2020 Speech Therapy Frequency (ACUTE ONLY) min 2x/week Treatment Duration 2 weeks      CHL IP ORAL PHASE 02/24/2020 Oral Phase Impaired Oral - Pudding Teaspoon -- Oral - Pudding Cup -- Oral - Honey Teaspoon -- Oral - Honey Cup -- Oral - Nectar Teaspoon -- Oral - Nectar Cup -- Oral - Nectar Straw -- Oral - Thin Teaspoon -- Oral - Thin Cup -- Oral - Thin Straw -- Oral - Puree Piecemeal swallowing Oral - Mech Soft -- Oral - Regular Piecemeal swallowing Oral - Multi-Consistency -- Oral - Pill -- Oral Phase - Comment --  CHL IP PHARYNGEAL PHASE 02/24/2020 Pharyngeal Phase WFL Pharyngeal- Pudding Teaspoon -- Pharyngeal -- Pharyngeal- Pudding Cup -- Pharyngeal -- Pharyngeal- Honey Teaspoon -- Pharyngeal -- Pharyngeal- Honey Cup -- Pharyngeal -- Pharyngeal- Nectar Teaspoon -- Pharyngeal -- Pharyngeal- Nectar Cup -- Pharyngeal -- Pharyngeal- Nectar Straw -- Pharyngeal -- Pharyngeal- Thin Teaspoon -- Pharyngeal -- Pharyngeal- Thin Cup -- Pharyngeal -- Pharyngeal- Thin Straw -- Pharyngeal -- Pharyngeal- Puree -- Pharyngeal -- Pharyngeal- Mechanical Soft -- Pharyngeal -- Pharyngeal- Regular -- Pharyngeal -- Pharyngeal- Multi-consistency -- Pharyngeal -- Pharyngeal- Pill -- Pharyngeal -- Pharyngeal Comment --  No flowsheet data found. [name] 02/24/2020, 11:14 AM              EEG adult  Result Date: 02/16/2020 Jill Havens, Clarke     02/16/2020 10:46 AM Patient Name: Jill Clarke MRN: [id number] Epilepsy Attending: Lora Clarke Referring Physician/Provider: Dr Rosalin Clarke Date: 02/16/2020 Duration: 24.48 mins Patient history: 35 year old  female with altered mental status.  EEG to evaluate for seizures. Level of alertness: Comatose AEDs during EEG study: LEV Technical aspects: This EEG study was done with scalp electrodes positioned according to the 10-20 International system of electrode placement. Electrical activity was acquired at a sampling rate of '500Hz'  and reviewed with a high frequency filter of '70Hz'  and a low frequency filter of '1Hz' . EEG data were recorded continuously and digitally stored. Description: EEG showed continuous polymorphic 3 to 6 Hz theta-delta slowing. Hperventilation and photic stimulation were not performed.   ABNORMALITY -Continuous slow, generalized IMPRESSION: This study is suggestive of moderate to severe diffuse encephalopathy, nonspecific etiology.  No seizures or epileptiform discharges were seen throughout the recording. Jill Clarke   EEG adult  Result Date: 02/12/2020 Jill Havens, Clarke     02/12/2020  4:06 PM Patient Name: KHALEELAH YOWELL MRN: [id number] Epilepsy Attending: Lora Clarke Referring Physician/Provider: Dr. Kathrynn Speed Date: 02/12/2020 Duration: 25.25 minutes Patient history: 35 year old female with altered mental status.  EEG to evaluate for seizures. Level of alertness: Awake, asleep AEDs during EEG study: None Technical aspects: This EEG study was done with scalp electrodes positioned according to the 10-20 International system of electrode placement. Electrical activity was acquired at a sampling rate of '500Hz'  and reviewed with a high frequency filter of '70Hz'  and a low frequency filter of '1Hz' . EEG data were recorded continuously and digitally stored. Description: Np posterior dominant rhythm was seen. Sleep was characterized by sleep spindles (12 to 14 Hz), maximal frontocentral region.  EEG showed continuous generalized 3 to 5 Hz theta and delta slowing.  Hyperventilation and photic stimulation were not performed. At 1531, patient was noted to move her right arm towards her  chest followed by slow but rhythmic jerking of right upper extremity.  Concomitant EEG showed high amplitude 2 to 3 Hz rhythmic delta slowing in bitemporal region which gradually increases in amplitude and appears more sharply contoured without definite evolution in frequency. ABNORMALITY -Continue slow, generalized IMPRESSION: This study is suggestive of moderate diffuse encephalopathy, nonspecific etiology.  At 1531, patient was noted to have right upper extremity jerking.  Concomitant EEG showed high amplitude 2 to 3 Hz rhythmic delta slowing in bitemporal region with evolution in morphology and amplitude but without definite evolution in frequency concerning for focal motor seizure. Jill. Leonel Ramsay was notified. Jill Clarke   MR MRV HEAD W WO CONTRAST  Result Date: 02/18/2020 CLINICAL DATA:  Stroke, follow-up. EXAM: MRI HEAD WITH CONTRAST MRV HEAD WITHOUT AND WITH CONTRAST TECHNIQUE: Multiplanar, multiecho pulse sequences of the brain and surrounding structures were obtained with intravenous contrast. Angiographic images of the intracranial venous structures were obtained using MRV technique without and with intravenous contrast. COMPARISON:  Head CT 02/16/2020, report from thrombectomy 02/15/2020, MRI/MRV head 02/15/2020 CONTRAST:  61m GADAVIST GADOBUTROL 1 MMOL/ML IV SOLN FINDINGS: MR HEAD FINDINGS Brain: The examination is intermittently motion degraded, limiting evaluation. Most notably, there is severe motion degradation of the sagittal T1 weighted sequence and moderate motion degradation of the coronal T2 weighted sequence. The dominant acute/subacute hemorrhagic venous infarct within the left frontoparietal lobes has not significantly changed. Unchanged local mass effect with partial effacement of the left lateral ventricle. Restricted diffusion at site of an acute/early subacute hemorrhagic infarct within the right callosal splenium has subtly increased in extent. Persistent although decreased  T2/FLAIR hyperintensity within the bilateral basal ganglia and thalami. Possible new punctate acute infarct within the left thalamus capsular junction (series 4, image  24). Bilateral acute/early subacute deep watershed white matter infarcts otherwise appear similar in distribution and extent. Persistent trace subarachnoid hemorrhage along the left frontoparietal convexity. No midline shift. Vascular: Expected proximal arterial flow voids. Skull and upper cervical spine: Within limitations of motion degradation, no focal marrow lesion is identified. Sinuses/Orbits: Visualized orbits show no acute finding. Fluid within the posterior ethmoid air cells and sphenoid sinuses bilaterally. Right mastoid effusion. MRV HEAD FINDINGS Flow within the superior sagittal sinus has significantly improved since the prior MRV of 02/15/2020. However, there are persistent foci of nonocclusive thrombus within this vessel. Nonocclusive thrombus within the left transverse and proximal sigmoid dural venous sinuses has increased. Persistent nonocclusive thrombus within the distal right transverse sinus. Persistent near occlusive or occlusive thrombus within the right sigmoid sinus and visualized right internal jugular vein. The degree of flow within the deep venous system is unchanged. The veins of [place]rolard and a few additional cerebral veins overlying the bilateral frontoparietal convexities remain thrombosed (best appreciated on the axial precontrast T1 weighted sequence). MRV impression #2 will be called to the ordering clinician or representative by the Radiologist Assistant, and communication documented in the PACS or [employer]. IMPRESSION: MRI brain: 1. Motion degraded exam. 2. 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 3. Restricted diffusion at site of an acute/subacute hemorrhagic infarct within the right callosal splenium has subtly  increased in extent. 4. T2/FLAIR hyperintensity within the deep gray nuclei persists but has diminished. However, there may be a new punctate acute infarct within the left thalamocapsular junction. 5. 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 6. Persistent trace subarachnoid hemorrhage along the left frontoparietal convexity. MRV head: 1. Flow within the superior sagittal sinus has significantly improved since the prior MRV of 02/15/2020. However, there are persistent foci of nonocclusive thrombus within this vessel. 2. Nonocclusive thrombus within the left transverse and proximal sigmoid dural venous sinuses has increased. 3. Persistent nonocclusive thrombus within the right transverse sinus. 4. Persistent near occlusive or occlusive thrombus within the right sigmoid sinus and visualized right internal jugular vein. 5. The degree of flow within the deep venous system is unchanged. 6. The veins of Trolard and a few additional cerebral veins overlying the bilateral frontoparietal convexities remain thrombosed. Electronically Signed   By: Kellie Simmering DO   On: 02/18/2020 11:38   IR PTA VENOUS EXCEPT DIALYSIS CIRCUIT  Result Date: 02/24/2020 INDICATION: Stuporous.  Dural venous sinus thrombosis.  EXAM: 1. EMERGENT LARGE VESSEL OCCLUSION THROMBOLYSIS (POSTERIOR CIRCULATION)  COMPARISON:  MRI MRV of February 15, 2020.  MEDICATIONS: Ancef 2 g IV antibiotic was administered within 1 hour of the procedure.  ANESTHESIA/SEDATION: General anesthesia  CONTRAST:  Isovue 300 100 mL  FLUOROSCOPY TIME:  Fluoroscopy Time: 96 minutes 12 seconds (3924 mGy).  COMPLICATIONS: None immediate.  TECHNIQUE: Following a full explanation of the procedure along with the potential associated complications, an informed witnessed consent was obtained. The risks of intracranial hemorrhage of 10%, worsening neurological deficit, ventilator dependency, death and inability to  revascularize were all reviewed in detail with the patient's mother.  The patient was then put under general anesthesia by the Department of Anesthesiology at [facility]  The in situ right common femoral vein 8 [place], and in situ left common femoral arterial French sheath were exchanged over a 0.035 inch guidewire for a new sheath which were then connected to continuous heparinized saline infusion.  Over a 0.035 inch Roadrunner guidewire, a 5 [place] JB 1 catheter was advanced to the aortic arch region and positioned in the right common carotid artery.  Through the right common femoral vein, over a 0.035 inch Roadrunner guidewire, a 5 French catheter was administered to the right internal jugular vein to the skull base. This in turn was then exchanged over a 0.035 inch 300 cm Constance Holster exchange guidewire for an 8 French 80 cm Neuron Max sheath. The guidewire was removed. Good aspiration obtained from the hub of the Neuron Max sheath. A gentle control arteriogram performed through this demonstrates safe position of the tip of the Neuron Max sheath. This was then connected to continuous heparinized saline infusion.  FINDINGS: The right common carotid arteriogram demonstrates the right external carotid artery and its major branches to be widely patent.  The right internal carotid artery at the bulb to the cranial skull base is widely patent.  The petrous, cavernous and supraclinoid segments are also widely patent.  The right middle cerebral artery and the right anterior cerebral opacify into the capillary and venous phases.  The venous phase demonstrates again significantly decreased venous congestion overlying both cerebral convexities. Patency is noted of the right transverse sinus to the right transverse sinus sigmoid sinus junction.  This vein of Labbe and the posterior temporal vein are patent draining into venous channel running parallel to the to the occluded right internal jugular vein.   Patency is maintained at the torcula, the left transverse sinus, the left sigmoid sinus and the left internal jugular vein.  PROCEDURE: An 071 95 cm Benchmark catheter was advanced through the Neuron Max guide sheath to the left internal jugular occluded vein.  An 035 inch Roadrunner wire was first advanced without difficulty into the right sigmoid sinus and the distal right transverse sinus. However, advancement of the 6 [place] Benchmark was met with significant resistance.  At this time, in a coaxial manner and with constant heparinized saline infusion over a 016 inch Fathom micro guidewire, a Marksman 027 microcatheter was advanced without difficulty to the torcula. The guidewire was removed. Control arteriogram was then performed from the proximal right transverse sinus. This confirmed brisk flow through the right transverse and left transverse sinuses.  This also demonstrated the partially occluded on the torcula. The 016 micro guidewire was reintroduced, and the torque device was advanced without difficulty into the distal 1/3 of the superior sagittal sinus and eventually into the junction of the anterior and the middle 1/3. Micro guidewire was removed. Good aspiration was obtained from the hub of the microcatheter. A gentle control arteriogram performed through this demonstrated extensive occlusion of the anterior 2/3 of the superior sagittal sinus.  Through the microcatheter, a 6 mm x 40 mm Solitaire X retrieval device was advanced and deployed through the microcatheter. The microcatheter was then gently retrieved. The 6 [place] Benchmark guide catheter was then advanced to just inside the torcula having the hub at the right groin.  At this time as constant aspiration was applied through the Benchmark, the retrieval device and the microcatheter were retrieved and removed. Copious amounts of clot were seen in the canister, and also in the retrieval device. Three further passes were made with a similar  arrangement resulting in more clot being removed.  A final pass was then made through a 071 136 cm Zoom aspiration catheter which replaced the Benchmark catheter. This could be advanced more distally to the anterior third of the superior sagittal sinus.  Constant  aspiration was applied as the aspiration catheter was retrieved slowly more proximally and into the right transverse sinus.  Again more clot was obtained especially in the canister.  A control arteriogram performed through the diagnostic catheter in the right common carotid artery into the venous phase now demonstrated complete recanalization of the superior sagittal sinus, the right transverse sinus, the left transverse sinus, the left sigmoid sinus and the left internal jugular vein.  Gentle control injection performed through the Zoom aspiration catheter in the mid right transverse sinus demonstrated significant stenosis of the right transverse sinus sigmoid sinus junction.  Also noted was severely contracted right sigmoid sinus to the internal jugular vein.  At this time, a 8 mm x 40 mm sterling balloon angioplasty catheter was advanced over a 0.014 inch exchange micro guidewire. Balloon angioplasty was then performed extending from the distal 1/3 of the transverse sinus on the right side, the transverse sinus sigmoid sinus junction, and the sigmoid sinus. At the end of this, a control arteriogram performed in the venous phase demonstrated marginally improved flow through the right sigmoid sinus into the now patent right internal jugular vein.  However, there continued be prominent collateral just lateral to this at the level of the transverse sinus sigmoid sinus junction draining the posterior temporal vein, and the vein of Labbe. There continued be stenosis at the right transverse sinus sigmoid sinus junction partially improved.  As venous grafts continued through superior sagittal sinus and transverse sinuses and the right sigmoid sinus and  through a prominent collateral just into the right sigmoid sinus, it was elected to stop.  071 Zoom aspiration catheter, and the Neuron Max sheath were removed with hemostasis achieved at the right groin venous puncture site. The left femoral sheath was then removed and hemostasis achieved with a 5 [place] ExoSeal closure device. Distal pulses remained palpable.  A CT of the brain demonstrated no gross subarachnoid hemorrhage or mass effect or midline shift.  The patient was left intubated and transferred to the neuro ICU.  IMPRESSION: Status post endovascular complete revascularization of entire superior sagittal sinus, the right transverse sinus, the left transverse sinus, the left sigmoid sinus, the left internal jugular vein, and partially the right sigmoid sinus and right internal jugular vein using the aspiration, and stent retrieval technique as described.  PLAN: Follow-up in the clinic 4 to 6 weeks post discharge.   Electronically Signed   By: Luanne Bras M.D.   On: 02/17/2020 13:36  ECHOCARDIOGRAM COMPLETE  Result Date: 02/16/2020    ECHOCARDIOGRAM REPORT   Patient Name:   ANALISA SLEDD Date of Exam: 02/16/2020 Medical Rec #:  [id number]          Height:       65.0 in Accession #:    [id number]         Weight:       153.9 lb Date of Birth:  1985-09-22         BSA:          1.770 m Patient Age:    34 years           BP:           99/80 mmHg Patient Gender: F                  HR:           96 bpm. Exam Location:  Inpatient Procedure: 2D Echo Indications:    stroke 434.91  History:  Patient has no prior history of Echocardiogram examinations.  Sonographer:    Johny Chess Referring Phys: [place]  1. Left ventricular ejection fraction, by estimation, is 55 to 60%. The left ventricle has normal function. The left ventricle has no regional wall motion abnormalities. Left ventricular diastolic parameters were normal.  2. Right ventricular systolic function  is normal. The right ventricular size is normal. There is normal pulmonary artery systolic pressure.  3. Mild mitral valve leaflet thickening and calcification. Trivial mitral valve regurgitation.  4. The aortic valve is tricuspid. There is mild thickening of the aortic valve. Aortic valve regurgitation is not visualized. No aortic stenosis is present.  5. The inferior vena cava is normal in size with <50% respiratory variability, suggesting right atrial pressure of 8 mmHg. Conclusion(s)/Recommendation(s): No intracardiac source of embolism detected on this transthoracic study. A transesophageal echocardiogram is recommended to exclude cardiac source of embolism if clinically indicated. FINDINGS  Left Ventricle: Left ventricular ejection fraction, by estimation, is 55 to 60%. The left ventricle has normal function. The left ventricle has no regional wall motion abnormalities. The left ventricular internal cavity size was normal in size. There is  no left ventricular hypertrophy. Left ventricular diastolic parameters were normal. Right Ventricle: The right ventricular size is normal. Right vetricular wall thickness was not well visualized. Right ventricular systolic function is normal. There is normal pulmonary artery systolic pressure. The tricuspid regurgitant velocity is 2.06 m/s, and with an assumed right atrial pressure of 3 mmHg, the estimated right ventricular systolic pressure is 94.3 mmHg. Left Atrium: Left atrial size was normal in size. Right Atrium: Right atrial size was normal in size. Pericardium: There is no evidence of pericardial effusion. Mitral Valve: The mitral valve is abnormal. There is mild thickening of the mitral valve leaflet(s). There is mild calcification of the mitral valve leaflet(s). Trivial mitral valve regurgitation. Tricuspid Valve: The tricuspid valve is normal in structure. Tricuspid valve regurgitation is trivial. Aortic Valve: The aortic valve is tricuspid. There is mild  thickening of the aortic valve. Aortic valve regurgitation is not visualized. No aortic stenosis is present. Pulmonic Valve: The pulmonic valve was normal in structure. Pulmonic valve regurgitation is trivial. Aorta: The aortic root and ascending aorta are structurally normal, with no evidence of dilitation. Venous: The inferior vena cava is normal in size with less than 50% respiratory variability, suggesting right atrial pressure of 8 mmHg. IAS/Shunts: No atrial level shunt detected by color flow Doppler.  LEFT VENTRICLE PLAX 2D LVIDd:         4.40 cm  Diastology LVIDs:         3.20 cm  LV e' medial:    12.70 cm/s LV PW:         1.00 cm  LV E/e' medial:  7.3 LV IVS:        0.80 cm  LV e' lateral:   15.60 cm/s LVOT diam:     1.90 cm  LV E/e' lateral: 6.0 LV SV:         50 LV SV Index:   28 LVOT Area:     2.84 cm  RIGHT VENTRICLE             IVC RV S prime:     15.70 cm/s  IVC diam: 1.90 cm TAPSE (M-mode): 2.3 cm LEFT ATRIUM             Index       RIGHT ATRIUM  Index LA diam:        3.00 cm 1.70 cm/m  RA Area:     11.90 cm LA Vol (A2C):   32.6 ml 18.42 ml/m RA Volume:   27.70 ml  15.65 ml/m LA Vol (A4C):   32.5 ml 18.37 ml/m LA Biplane Vol: 34.1 ml 19.27 ml/m  AORTIC VALVE LVOT Vmax:   97.30 cm/s LVOT Vmean:  73.400 cm/s LVOT VTI:    0.177 m  AORTA Ao Root diam: 2.50 cm Ao Asc diam:  2.30 cm MITRAL VALVE               TRICUSPID VALVE MV Area (PHT): 4.31 cm    TR Peak grad:   17.0 mmHg MV Decel Time: 176 msec    TR Vmax:        206.00 cm/s MV E velocity: 93.00 cm/s MV A velocity: 81.80 cm/s  SHUNTS MV E/A ratio:  1.14        Systemic VTI:  0.18 m                            Systemic Diam: 1.90 cm Jill Clarke Electronically signed by Jill Clarke Signature Date/Time: 02/16/2020/2:49:18 PM    Final    [place] EKG SITE RITE  Result Date: 02/13/2020 If Site Rite image not attached, placement could not be confirmed due to current cardiac rhythm.  IR ANGIO INTRA EXTRACRAN SEL INTERNAL  CAROTID UNI L MOD SED  Result Date: 02/18/2020 INDICATION: Stuporous.  Unresponsive.  Occluded dural venous sinuses. EXAM: 1. EMERGENT LARGE VESSEL OCCLUSION THROMBOLYSIS (POSTERIOR CIRCULATION) COMPARISON:  MRI brain February 12, 2020. MRI venogram of February 12, 2020. MEDICATIONS: Ancef 2 g IV antibiotic was administered within 1 hour of the procedure. ANESTHESIA/SEDATION: General anesthesia CONTRAST:  Isovue 300 approximately 200 mL. FLUOROSCOPY TIME:  Fluoroscopy Time: 138 minutes 24 seconds (3981 mGy). COMPLICATIONS: None immediate. TECHNIQUE: Following a full explanation of the procedure along with the potential associated complications, an informed witnessed consent was obtained from the patient's mother. The risks of intracranial hemorrhage of 10%, worsening neurological deficit, ventilator dependency, death and inability to revascularize were all reviewed in detail with the patient's mother. The patient was then put under general anesthesia by the Department of Anesthesiology at [facility]. The right groin was prepped and draped in the usual sterile fashion. Thereafter using modified Seldinger technique, transfemoral access into the right common femoral vein was obtained without difficulty. Over a 0.035 inch guidewire a 5 French Pinnacle sheath was inserted. Through this, and also over a 0.035 inch guidewire a 5 [place] JB 1 catheter was advanced and positioned in the left internal jugular vein to the cranial skull base. The guidewire was removed. Good aspiration was obtained from hub of the diagnostic catheter. A gentle control arteriogram confirmed safe position of tip of the JB 1 diagnostic catheter. Over a 0.035 inch 300 cm Rosen exchange guidewire, an 8 [place] Neuron Max sheath was then inserted after removal of the 5 [place] sheath. The tip of the Neuron Max guide sheath was positioned in the internal jugular vein at the skull base. This was then connected to continuous heparinized saline  infusion. Through the left groin, access into the left common femoral artery obtained over a 0.018 inch micro guidewire and a micropuncture set. Over a 0.035 inch J guidewire, a 5 [place] JB 1 catheter was advanced to the aortic arch region, and selectively positioned in the  right common carotid artery, the right vertebral artery and the left common carotid artery. Finally the tip of the diagnostic catheter was positioned in the proximal left internal carotid artery and connected to continuous heparinized saline infusion. FINDINGS: The right common carotid arteriogram demonstrates the right external carotid artery and its major branches to be widely patent. The right internal carotid artery at the bulb to the cranial skull base opacifies widely. The petrous, cavernous and supraclinoid segments are widely patent. The right middle cerebral artery and the right anterior cerebral artery opacify into the capillary phase. The delayed venous phase demonstrates engorged cortical veins in the entirety of the right cerebral hemisphere with stagnation of flow in the parasagittal venous systems of the posterior 2/3 of the superior sagittal is noted. No significant opacification is noted in the transverse sinuses or sigmoid sinuses or marginal sinuses. Prominence of the vein of Labbe is seen projecting into a laterally positioned engorged vein running parallel to the occluded internal jugular vein. The right vertebral artery origin is widely patent. Vessel is seen to opacify normally to the cranial skull base. Patency is maintained of the right posterior-inferior cerebral artery and the right vertebrobasilar junction. The posterior cerebral arteries, the superior cerebellar arteries and the anterior-inferior cerebellar arteries opacify into the capillary and venous phases. The venous phase demonstrates opacification of the distal 2/3 of the left transverse sinus, the left sigmoid sinus and the left internal jugular vein. There is  no opacification of the torcula or the inferior sagittal sinus, or the right transverse or right sigmoid sinus. The left common carotid arteriogram demonstrates the left external carotid artery and its major branches to be widely patent. The left internal carotid artery at the bulb to the cranial skull base is widely patent. The petrous, cavernous and supraclinoid segments are functional. The left middle cerebral artery and the left anterior cerebral artery opacify into the capillary phase with delayed exit of contrast in the capillary phase with attenuated caliber of the cortical veins. No opacification is seen of the distal 2/3 of the superior sagittal sinus, the torcula or the right-sided dural venous sinuses. There was no significant opacification seen of the left transverse sinus either. However, there is opacification of vein of the Labbe, and the posterior temporal vein which project into the left transverse sinus sigmoid sinus junction. Patency of the left sigmoid sinus is noted. Lateral to this vein projecting parallel to the internal jugular vein. PROCEDURE: Arterial injection through the left common carotid artery into the venous phase were utilized for venous road maps. Over a 0.035 inch Roadrunner guidewire a 105 cm 071 Benchmark guide catheter was advanced to the distal end of the 8 [place] Neuron Max sheath. The 035 inch Roadrunner guidewire was easily advanced through the left internal jugular bulb, the left sigmoid sinus, the left transverse sinus and into mid right transverse sinus followed by the Benchmark catheter. The guidewire was removed. Resistance to aspiration was noted. Through the [facility] guide sheath, over a 0.014 inch standard Synchro micro guidewire, with a J configuration a Marksman 027 microcatheter was then advanced without difficulty to the right transverse sinus sigmoid sinus junction. Using a torque device the micro guidewire was eventually advanced into the distal right sigmoid  sinus at the junction with the internal jugular bulb followed by the microcatheter. The guidewire was removed. Safe position of the tip of the microcatheter was confirmed. A 6 mm x 45 mm Solitaire X retrieval device was then advanced to the distal end  of the microcatheter and deployed in the usual manner by unsheathing the microcatheter. Constant aspiration was then applied at the hub of the 6 [place] guide catheter now positioned in the distal right transverse sinus as gradually the retrieval device was retrieved into the guide sheath and removed. The guide sheath was also retrieved more proximally into the left sigmoid sinus. Copious amounts of clot were noted in the aspirate, and also on the stent retriever. Multiple passes were then made again as described above with access obtained into the right sigmoid sinus and the right internal jugular vein followed by deployment of the 6 x 45 retriever. This was then followed by slow aspiration at the hub of the Benchmark catheter which continued to retrieve into the left sigmoid sinus. Again 3 or 4 passes were made with copious amounts of clot retrieved into the canister and also in the aspirate, and in the retrieval device. Eventually arterial injection in the venous phase demonstrated significantly improved caliber of the distal right transverse sinus and the left transverse sinus. A control arteriogram performed now demonstrates significant decrease in the stagnation of flow in the cortical veins and the left cerebral hemisphere in general. Vein of Trolard was now noted to be filling though with nonocclusive filling defects consistent with clot. No significant improvement was noted in the superior sagittal sinus. Flow was also noted to be more brisk in the vein of Labbe and the left posterior temporal vein. Control arteriograms also demonstrated flow into the now recanalized right transverse sinus and to the left transverse sinus and egress into the left sigmoid sinus  and left internal jugular vein. Despite multiple attempts with the microcatheter and retriever in the right sigmoid sinus, no significant opacification was realized despite removal of clot. Advancement at the right sigmoid sinus transverse sinus junction was met with significant resistance raising the possibly of an underlying severe transverse sinus sigmoid sinus stenosis. Multiple attempts were also made to access the superior sagittal sinus through the torcula which was met with significant resistance. Multiple attempts were made to access the superior sagittal sinus, and the torcula. Access was obtained into the straight sinus, and with retriever and aspiration clot was retrieved from this site. In view of the amount of contrast utilized, and the restoration of antegrade flow in the right transverse sinus, the left transverse sinus sigmoid sinus, and significantly improved flow through the left cerebral hemisphere, the procedure was stopped. A CT of the brain demonstrated no evidence of intracranial hemorrhages, or mass effect. There appeared to be generalized improvement in the left cerebral hemispheric swelling. 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 On the CT also noted was the posterior cerebral veins, the vein of Galen and the straight sinus. The 8 [place] Neuron Max was removed and replaced with a short 8 [place] Pinnacle sheath which was connected to continuous heparinized saline infusion. The 5 French left common femoral artery sheath was left in-situ, and be readily available in case of a second treatment. Patient was left intubated and returned to the floor in stable neurologic condition. IMPRESSION: Status post endovascular revascularization of occluded right transverse sinus, left transverse sinus and partially the right sigmoid sinus with significant improved flow and decompression of the left cerebral hemispheric venous system. Partial recanalization of the  vein of Trolard noted. PLAN: Follow patient clinically for signs of decrease in intracranial pressure, increased improvement and improved sensorium. Probable repeat MRI of the brain and MRA of the brain in a  day or two. Continue with IV heparin treatment. Electronically Signed   By: Luanne Bras M.D.   On: 02/17/2020 10:40       HISTORY OF PRESENT ILLNESS JAELINE WHOBREY is a 35 y.o. female with a history significant for asthma and sickle cell trait. Ms. Elmore presented to the ED 02/11/20 when her mother noticed she had altered mental status at home.  HOSPITAL COURSE Pt presented initially with Altered mental status on 02/12/20 following one week of GI symptoms. CT head was obtained revealing a small volume subarachnoid hemorrhage and hyperdense dural sinuses with a filling defect concerning for dural venous thrombosis. 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 IR was consulted for thrombectomy. S/p thrombectomy on 02/13/20 where large chunks of clot removed with revascularization of the TS bilaterally and partially the RT SS and the torcula. Pt was intubated then transferred to Neuro ICU .Repeat MRI Brain showed progressive left perirolandic venous infarction with petechial hemorrhage and new edema developing now in the deep gray nuclei but without infarction. MRV after procedure showed improved flow in the deep venous system and proximal right transverse sinus. Hypercoagulable panel revealed Decreased protein S at 68 and will have to be repeated at 6-8 weeks. Protein C total and activity as well as Antithrombin III levels and lupus anticoagulants were all normal.  Beta-2 glycoprotein and homocystine were also normal. Pt was started on IV heparin initially and treated with aggressive IV  fluids.Repeat CT Head scan does not show any acute abnormality. Pt had focal seizure on 02/12/20. EEG on 02/12/20 showed 2-3 Hz rhythmic delta slowing  inbitemporal region. She was started on Keppra 1000 mg BID which was reduced later to 500 mg BID. Repeat EEG on 02/16/20 suggestive of moderate to severe diffuse encephalopathy, nonspecific etiology. Negative for seizure or epileptiform discharges. Her Hb dropped to 7 after procedure which may be related to repeated procedure and IV heparin. Pt received 1 Units of PRBC on 02/16/20. Pt blood pressure controlled with Phenylephrine, Electrolytes were monitored and replaced accordingly. Pt was started on Unasyn on 02/18/20 for ?Mastoiditis on MRI. Pt extubated on 02/17/20. Pt tolerated procedure well. Hypertonic saline stopped and started her on D5 with NS. Repeat MRV Head showed significant improvement of flow within superior sagittal sinus since the prior MRV of 02/15/2020 and persistent foci of nonocclusive thrombus within this vessel.  Pt was transferred to progressive unit.  Hematocrit stable. Pt transitioned to oral diet and IV fluids discontinued on 02/26/20. Heparin changed to Pradaxa. Lipitor was started. Unasyn was stopped on 02/27/20. PT /OT suggest CIR. Pt is medically stable to go to CIR.  DISCHARGE EXAM Blood pressure 118/77, pulse (!) 107, temperature 98.3 F (36.8 C), temperature source Oral, resp. rate 16, height '5\' 5"'  (1.651 m), weight 69.4 kg, last menstrual period 02/03/2020, SpO2 100 %.  PHYSICAL EXAM GENERAL: Well developed, young african american lady  sitting comfortably in bed, NAD Head: Normocephalic and atraumatic. EENT: No OP obstruction, normal conjunctiva.  LUNGS - Normal respiratory effort on room air CV - No peripheral edema.  ABDOMEN - Soft, non-distended Ext: warm, well perfused.  Neurological:  Mental Status: Alert, awake, oriented to time, person, place and situation. Followmidline and appendicular commands. Speech is improving with some word finding difficulties. Pt is able to name only 6 four legged animals. Pt is able to read fluently from pamphlet.No paraphasic  errors. Good comprehension.  Cranial Nerves: PERRL, Pt is not able to  follow finger but observed to move eyes in all direction otherwise. visual fields full, no facial asymmetry, facial sensation intact, hearing intact, tongue/uvula/soft palate midline, normal sternocleidomastoid and trapezius muscle strength. No evidence of tongue atrophy or fibrillations.  Motor: Mild Drift in Rt Upper Extremity, No Drift in LE's. Strength 4+/5 in Rt UE, mild weakness in RT hand demonstrated on rapid tapping movements, 5 /5 in Lt UE, Strength in Rt LE  4/5, Lt LE 4/5. Poor effort observed.  Reflexes- UE -2+  , LE- 2+,  Tone: is normal and bulk is normal. Sensation- Intact to light touch bilaterally. Coordination: FTN intact bilaterally, no ataxia in BLE. Gait- deferred  Discharge Diet      Diet   Diet regular Room service appropriate? Yes; Fluid consistency: Thin   liquids  DISCHARGE PLAN  Disposition:  Transfer to [place] for ongoing PT, OT and ST  Pradaxa (dabigatran) twice a day for secondary stroke prevention for 6 months.   Recommend ongoing stroke risk factor control by Primary Care Physician at time of discharge from inpatient rehabilitation.  Follow-up PCP Pcp, No in 2 weeks following discharge from rehab.  Follow-up in [facility] in 4-6 weeks following discharge from rehab, office to schedule an appointment.   35 minutes were spent preparing discharge.  Rosalin Hawking, MD PhD Stroke Neurology 02/28/2020 4:35 PM

## 2020-02-27 NOTE — Progress Notes (Addendum)
OT Cancellation Note  Patient Details Name: Jill Clarke MRN: [id number] DOB: 11-15-1985   Cancelled Treatment:    Reason Eval/Treat Not Completed: Other (comment) ("Not right now. I will do all that later with you." in response to OOB and grooming)  [place], OTR/L Acute Rehab Pager: [phone] Office: [phone] 02/27/2020, 9:41 AM

## 2020-02-27 NOTE — Progress Notes (Signed)
Inpatient Rehab Admissions Coordinator:  There is a bed available for pt to admit to CIR Saturday, February 4th.  Dr. [name] aware and in agreement.  Dr. Posey Pronto will see pt in the morning to confirm readiness.  Floor nurse can call [facility] nursing station at [phone] by noon to give report.   Gayland Curry, [place], [place] Admissions Coordinator [phone]

## 2020-02-27 NOTE — H&P (Signed)
Physical Medicine and Rehabilitation Admission H&P    Chief Complaint  Patient presents with  . Stroke with functional deficits    HPI:  Jill Clarke. Jill Clarke is a 35 year old left handed female with history of T2DM, sickle cell trait who was admitted on 02/12/2020 with one week history of N/V, headaches and diarrhea. History taken from chart review and patient. She was seen in the ED on 02/10/2020-work-up was negative and she was discharged home.  She was readmitted on 02/12/2020 with nausea and MRI brain done revealing patchy acute infarcts in bilateral cerebral deep watershed white matter and along high left sylvian fissure with focal swelling and small volume SAH. 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 SAH felt to be in setting of significant thrombosis and she was started on hypertonic saline. She underwent cerebral angio with revascularization of occluded right and left transverse sinuses and partially of right sigmoid sinus with improvement in flow and decompression of venous system. She was noted to be somnolent with decreased movement on the right.  EEG was done, which showed moderate diffuse encephalopathy with delta slowing and bitemporal region. 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  Hospital course further complicated by hypercoagulability, panel showed low protein S and recommendations are to repeat in 6 to 8 weeks as this may be due to acute phase reaction.  Cerebral edema treated with hypertonic saline and phenylephrine added to help manage hypotension with SBP goal 120-160. Repeat MRV showed improved flow but occlusive clot still seen in superior sinus, distal right transverse and right sigmoid dural sinus with lack of recanalization of the vein of [place]. She underwent repeat cerebral angio 01/23  with  complete revascularization of SSS, straight sinus, vein of [place], ICD and right transverse sinus.  Follow-up EEG showed diffuse encephalopathy and Dr. [name] recommended continuing [place] for now. She was started on IV heparin and has been transitioned to Pradaxa on 01/31. She tolerated extubation without difficulty and diet has been advanced to regular diet. She was started on vancomycin and cefepime for mastoiditis on 02/15/2020-->narrowed to Unasyn. She continues to be limtied by HA, dizziness/lightheadedness, balance deficits with poor attention and tendency to bump into items on the right as well as difficulty completing ADL tasks. She has had decline in ability to carry out ADLs and as well as mobility therefore CIR recommended for progressive therapies.  Please see preadmission assessment from earlier today as well.   Review of Systems  Constitutional: Positive for malaise/fatigue.  Respiratory: Negative for shortness of breath.   Cardiovascular: Negative for chest pain.  Gastrointestinal: Negative for abdominal pain.  Musculoskeletal: Negative for myalgias.  Neurological: Positive for weakness. Negative for sensory change and speech change.  Psychiatric/Behavioral: Positive for memory loss.  All other systems reviewed and are negative.     Past Medical History:  Diagnosis Date  . Asthma    uses inhaler prn  . Diabetes mellitus without complication ([place])   . Sickle cell trait ([place])   . Unspecified high-risk pregnancy 01/27/2011    Past Surgical History:  Procedure Laterality Date  . IR ANGIO INTRA EXTRACRAN SEL COM CAROTID INNOMINATE UNI R MOD SED  02/13/2020  . IR ANGIO INTRA EXTRACRAN SEL COM CAROTID INNOMINATE UNI R MOD SED  02/15/2020  . IR ANGIO INTRA EXTRACRAN SEL INTERNAL CAROTID UNI L MOD SED  02/13/2020  . IR ANGIO VERTEBRAL SEL VERTEBRAL UNI R MOD SED  02/13/2020  . IR CT HEAD LTD  02/13/2020  . IR CT HEAD LTD  02/15/2020  . IR PTA VENOUS EXCEPT DIALYSIS CIRCUIT  02/15/2020  .  IR THROMBECT VENO MECH MOD SED  02/13/2020  . IR THROMBECT VENO MECH REPT MOD SED  02/15/2020  . RADIOLOGY WITH ANESTHESIA N/A 02/13/2020   Procedure: IR WITH ANESTHESIA;  Surgeon: Luanne Bras, MD;  Location: [place];  Service: Radiology;  Laterality: N/A;  . RADIOLOGY WITH ANESTHESIA N/A 02/15/2020   Procedure: IR WITH ANESTHESIA;  Surgeon: Radiologist, Medication, MD;  Location: [place];  Service: Radiology;  Laterality: N/A;  . TONSILLECTOMY    . WISDOM TOOTH EXTRACTION      Family History  Problem Relation Age of Onset  . Sickle cell trait Mother   . Depression Mother   . Asthma Father   . Drug abuse Father        heroin abuse  . Kidney disease Father        due to heroin abuse  . Diabetes Maternal Grandmother     Social History:  Lives with mother and 2 young children. 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 Per reports she has quit smoking. She has never used smokeless tobacco. She reports that she does not drink alcohol and does not use drugs.    Allergies: No Known Allergies    No medications prior to admission.    Drug Regimen Review  Drug regimen was reviewed and remains appropriate with no significant issues identified  Home: Home Living Family/patient expects to be discharged to:: Unsure Living Arrangements: Other relatives Additional Comments: per chart was "in between places" and staying with friends   Functional History: Prior Function Level of Independence: Independent Comments: assumed  Functional Status:  Mobility: Bed Mobility Overal bed mobility: Needs Assistance Bed Mobility: Supine to Sit Supine to sit: Supervision General bed mobility comments: pt received and left in recliner Transfers Overall transfer level: Needs assistance Equipment used: None Transfers: Sit to/from Stand Sit to Stand: Min guard Stand pivot transfers: Mod assist General transfer comment: Min Guard A for  safety Ambulation/Gait Ambulation/Gait assistance: Min Web designer (Feet): 80 Feet (80' x 2) Assistive device: None Gait Pattern/deviations: Step-through pattern General Gait Details: pt with step-through gait, does demonstrate increased lateral sway with dual task head turns. Pt also bumping into multiple objects on R side when distracted Gait velocity: reduced Gait velocity interpretation: <1.8 ft/sec, indicate of risk for recurrent falls Stairs: Yes Stairs assistance: Min guard Stair Management: One rail Left,Step to pattern Number of Stairs: 3 General stair comments: pt with posterior LOB immediately prior to starting stairs, then another posterior LOB after descending 2nd step    ADL: ADL Overall ADL's : Needs assistance/impaired Eating/Feeding: NPO Grooming: Wash/dry hands,Wash/dry face,Standing,Min guard Grooming Details (indicate cue type and reason): Cuing pt to perform hand hygiene at sink after toileting, pt getting to sink and then washing her face. Requiring Max cues to recall that she was asked to wash her hands. Upper Body Bathing: Supervision/ safety,Set up,Min guard,Standing Upper Body Bathing Details (indicate cue type and reason): min guard for dynamic standing tasks Lower Body Bathing: Min guard,Sit to/from stand Lower Body Bathing Details (indicate cue type and reason): minguard for dynamic LB bathing tasks with RUE supported on sink Upper Body Dressing : Minimal assistance,Sitting Upper Body Dressing Details (indicate cue type  and reason): to don new gown Lower Body Dressing: Min guard,Sit to/from stand Lower Body Dressing Details (indicate cue type and reason): Pt donning new underwear with Min Guard A for safety. Significant time for problem sovling and sequencing Toilet Transfer: Min Public house manager Details (indicate cue type and reason): MIN for balance and safety as pt attempting to sit prematurely Toileting- Marine scientist and Hygiene: Min guard,Sit to/from Sales promotion account executive Details (indicate cue type and reason): min guard for balance in standing Functional mobility during ADLs: Min guard General ADL Comments: Pt presenting with decreased cognition, balance, strength, and safety.  Cognition: Cognition Overall Cognitive Status: Impaired/Different from baseline Arousal/Alertness:  (awake but "foggy") Orientation Level: Oriented to person,Oriented to place,Oriented to situation,Disoriented to time Attention: Sustained Sustained Attention: Impaired Sustained Attention Impairment:  (will continue to assess) Awareness: Impaired Awareness Impairment: Intellectual impairment Problem Solving: Impaired Problem Solving Impairment: Functional basic Behaviors:  (flat affect) Safety/Judgment: Impaired Cognition Arousal/Alertness: Awake/alert Behavior During Therapy: WFL for tasks assessed/performed Overall Cognitive Status: Impaired/Different from baseline Area of Impairment: Attention,Memory,Following commands,Safety/judgement,Awareness,Problem solving Orientation Level: Disoriented to,Time (Using external clues for stating month and day of week, stated at [employer] but when asked if this place looks like that she said "no, i'm at [facility]") Current Attention Level: Sustained Memory: Decreased short-term memory Following Commands: Follows one step commands with increased time,Follows multi-step commands inconsistently Safety/Judgement: Decreased awareness of safety,Decreased awareness of deficits Awareness: Emergent Problem Solving: Difficulty sequencing General Comments: Upon arrival, pt attempting to get OOB to use bathroom (urinary urgency). Requiring cues to wait and maintain safety. Pt continues to present with poro safety, attention, and awareness. Pt demonstrate poor selective attention and easily distracted by external stimuli. Pt requiring cues for  safety and problem solving Difficult to assess due to: Level of arousal   Blood pressure 106/63, pulse 99, temperature 97.9 F (36.6 C), temperature source Axillary, resp. rate 16, height 5\' 5"  (1.651 m), weight 69.4 kg, last menstrual period 02/03/2020, SpO2 100 %. Physical Exam Vitals and nursing note reviewed.  Constitutional:      General: She is not in acute distress.    Appearance: She is normal weight.  HENT:     Head: Normocephalic and atraumatic.     Right Ear: External ear normal.     Left Ear: External ear normal.     Nose: Nose normal.  Eyes:     General:        Right eye: No discharge.        Left eye: No discharge.     Extraocular Movements: Extraocular movements intact.  Cardiovascular:     Rate and Rhythm: Regular rhythm. Tachycardia present.  Pulmonary:     Effort: Pulmonary effort is normal. No respiratory distress.     Breath sounds: No stridor.  Abdominal:     General: Abdomen is flat. Bowel sounds are normal. There is no distension.  Musculoskeletal:     Cervical back: Normal range of motion and neck supple.     Comments: No edema or tenderness in extremities  Skin:    General: Skin is warm and dry.  Neurological:     Mental Status: She is alert and easily aroused.     Comments: Alert and oriented self and place.  Unable to recall month or name 2nd month and needed cues to state age of her children.  Motor: RUE: 5/5 proximal to distal LUE: 5/5 proximal to distal RLE: HF 5/5, KE 4+/5,  ADF 4/5 LLE: HF: 4+/5, KE, ADF 4-/5 Sensation intact to light touch  Psychiatric:        Mood and Affect: Affect is blunt.        Behavior: Behavior is slowed.     Results for orders placed or performed during the hospital encounter of 02/12/20 (from the past 48 hour(s))  Glucose, capillary     Status: Abnormal   Collection Time: 02/25/20  8:14 PM  Result Value Ref Range   Glucose-Capillary 151 (H) 70 - 99 mg/dL    Comment: Glucose reference range applies only to  samples taken after fasting for at least 8 hours.  Glucose, capillary     Status: Abnormal   Collection Time: 02/25/20 11:39 PM  Result Value Ref Range   Glucose-Capillary 161 (H) 70 - 99 mg/dL    Comment: Glucose reference range applies only to samples taken after fasting for at least 8 hours.  Glucose, capillary     Status: Abnormal   Collection Time: 02/26/20  3:24 AM  Result Value Ref Range   Glucose-Capillary 116 (H) 70 - 99 mg/dL    Comment: Glucose reference range applies only to samples taken after fasting for at least 8 hours.   Comment 1 Notify RN    Comment 2 Document in Chart   Glucose, capillary     Status: Abnormal   Collection Time: 02/26/20  8:02 AM  Result Value Ref Range   Glucose-Capillary 105 (H) 70 - 99 mg/dL    Comment: Glucose reference range applies only to samples taken after fasting for at least 8 hours.  Basic metabolic panel     Status: Abnormal   Collection Time: 02/26/20  8:30 AM  Result Value Ref Range   Sodium 139 135 - 145 mmol/L   Potassium 3.5 3.5 - 5.1 mmol/L   Chloride 105 98 - 111 mmol/L   CO2 27 22 - 32 mmol/L   Glucose, Bld 106 (H) 70 - 99 mg/dL    Comment: Glucose reference range applies only to samples taken after fasting for at least 8 hours.   BUN <5 (L) 6 - 20 mg/dL   Creatinine, Ser 0.43 (L) 0.44 - 1.00 mg/dL   Calcium 8.9 8.9 - 10.3 mg/dL   GFR, Estimated >60 >60 mL/min    Comment: (NOTE) Calculated using the CKD-EPI Creatinine Equation (2021)    Anion gap 7 5 - 15    Comment: Performed at [facility] [address], [place], [place] [address]  CBC with Differential/Platelet     Status: Abnormal   Collection Time: 02/26/20  8:30 AM  Result Value Ref Range   WBC 6.5 4.0 - 10.5 K/uL   RBC 2.96 (L) 3.87 - 5.11 MIL/uL   Hemoglobin 8.6 (L) 12.0 - 15.0 g/dL   HCT 27.9 (L) 36.0 - 46.0 %   MCV 94.3 80.0 - 100.0 fL   MCH 29.1 26.0 - 34.0 pg   MCHC 30.8 30.0 - 36.0 g/dL   RDW 15.2 11.5 - 15.5 %   Platelets 465 (H) 150 - 400  K/uL   nRBC 0.0 0.0 - 0.2 %   Neutrophils Relative % 56 %   Neutro Abs 3.7 1.7 - 7.7 K/uL   Lymphocytes Relative 26 %   Lymphs Abs 1.7 0.7 - 4.0 K/uL   Monocytes Relative 12 %   Monocytes Absolute 0.8 0.1 - 1.0 K/uL   Eosinophils Relative 4 %   Eosinophils Absolute 0.3 0.0 - 0.5 K/uL  Basophils Relative 0 %   Basophils Absolute 0.0 0.0 - 0.1 K/uL   Immature Granulocytes 2 %   Abs Immature Granulocytes 0.15 (H) 0.00 - 0.07 K/uL    Comment: Performed at [facility], [place] [address], [place], [place] [address]  Glucose, capillary     Status: Abnormal   Collection Time: 02/26/20 11:51 AM  Result Value Ref Range   Glucose-Capillary 143 (H) 70 - 99 mg/dL    Comment: Glucose reference range applies only to samples taken after fasting for at least 8 hours.  Glucose, capillary     Status: Abnormal   Collection Time: 02/26/20  4:11 PM  Result Value Ref Range   Glucose-Capillary 152 (H) 70 - 99 mg/dL    Comment: Glucose reference range applies only to samples taken after fasting for at least 8 hours.  Glucose, capillary     Status: Abnormal   Collection Time: 02/26/20  8:40 PM  Result Value Ref Range   Glucose-Capillary 180 (H) 70 - 99 mg/dL    Comment: Glucose reference range applies only to samples taken after fasting for at least 8 hours.  Glucose, capillary     Status: Abnormal   Collection Time: 02/27/20  4:03 AM  Result Value Ref Range   Glucose-Capillary 127 (H) 70 - 99 mg/dL    Comment: Glucose reference range applies only to samples taken after fasting for at least 8 hours.  CBC     Status: Abnormal   Collection Time: 02/27/20  5:00 AM  Result Value Ref Range   WBC 7.4 4.0 - 10.5 K/uL   RBC 2.98 (L) 3.87 - 5.11 MIL/uL   Hemoglobin 8.8 (L) 12.0 - 15.0 g/dL   HCT 28.4 (L) 36.0 - 46.0 %   MCV 95.3 80.0 - 100.0 fL   MCH 29.5 26.0 - 34.0 pg   MCHC 31.0 30.0 - 36.0 g/dL   RDW 15.1 11.5 - 15.5 %   Platelets 504 (H) 150 - 400 K/uL   nRBC 0.0 0.0 - 0.2 %    Comment:  Performed at [facility], [place] [address] [place], [place] [address]  Glucose, capillary     Status: Abnormal   Collection Time: 02/27/20  7:52 AM  Result Value Ref Range   Glucose-Capillary 106 (H) 70 - 99 mg/dL    Comment: Glucose reference range applies only to samples taken after fasting for at least 8 hours.   Comment 1 Notify RN    Comment 2 Document in Chart   Glucose, capillary     Status: Abnormal   Collection Time: 02/27/20 11:50 AM  Result Value Ref Range   Glucose-Capillary 137 (H) 70 - 99 mg/dL    Comment: Glucose reference range applies only to samples taken after fasting for at least 8 hours.   Comment 1 Notify RN    Comment 2 Document in Chart   Glucose, capillary     Status: Abnormal   Collection Time: 02/27/20  4:01 PM  Result Value Ref Range   Glucose-Capillary 136 (H) 70 - 99 mg/dL    Comment: Glucose reference range applies only to samples taken after fasting for at least 8 hours.   No results found.     Medical Problem List and Plan: 1.  HA, dizziness/lightheadedness, balance deficits with poor attention,  decline in ability to carry out ADLs as well as mobility secondary to bilateral SAH, watershed infarcts, and venous thrombosis  -patient may shower  -ELOS/Goals: 9-14 days/supervision/mod  I  Admit to CIR 2.  Antithrombotics: -DVT/anticoagulation:  Pharmaceutical: Pradexa  -antiplatelet therapy: N/A 3. Headaches/Pain Management: Tylenol prn.  4. Mood: LCSW to follow for evaluation and support.   -antipsychotic agents: N/A 5. Neuropsych: This patient is not capable of making decisions on her own behalf. 6. Skin/Wound Care: Routine pressure relief measures.  7. Fluids/Electrolytes/Nutrition: Monitor I/O.   CMP ordered for Monday 8. 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   9. ABLA: Monitor CBC with serial checks--check CBC on Monday.   Transfused with 1 unit on 01/24  CBC ordered for Monday  10. Mastoiditis:  Complete course of Unasyn 11. Hypotension: SBP goal 120-160 range. Long term normotensive.   --Monitor orthostatic BP due to reports of dizziness 12.  Lethargy: On amantadine for attention/activation. Keppra decreased to 500 mg on 02/24/20  Bary Leriche, PA-C 02/27/2020  I have personally performed a face to face diagnostic evaluation, including, but not limited to relevant history and physical exam findings, of this patient and developed relevant assessment and plan.  Additionally, I have reviewed and concur with the physician assistant's documentation above.  Delice Lesch, MD, ABPMR

## 2020-02-27 NOTE — Progress Notes (Signed)
Occupational Therapy Treatment Patient Details Name: Jill Clarke MRN: [id number] DOB: 1985/02/24 Today's Date: 02/27/2020    History of present illness 35 y.o. female with a history significant for asthma and sickle cell trait presented to ED 1/19 for N/V and discharged home. Returned 1/20 due to AMS. CT Head revealing a small volume subarachnoid hemorrhage and hyperdense dural sinuses with a filling defect concerning for dural venous thrombosis. 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 Intubated 1/21-1/25.   OT comments  Pt continues to present with decreased balance, cognition, and safety. Upon arrival, pt attempting to step over bed rail with bed alarm sounding. Reports needing to use restroom and requiring cues for waiting to optimize safety on environment. Pt performing grooming at sink with Max A to recall which tasks she was asked to perform. Completing mobility in hallway with Min guard A and cues for poor attention, pt very distracted by environment and running into wall on right. Continue to recommend dc to CIR and will continue to follow acutely as admitted.    Follow Up Recommendations  CIR    Equipment Recommendations  Wheelchair (measurements OT);Wheelchair cushion (measurements OT);Hospital bed;Other (comment);3 in 1 bedside commode    Recommendations for Other Services Rehab consult    Precautions / Restrictions Precautions Precautions: Fall Precaution Comments: fall on 1/31 attempting to get OOB alone Restrictions Weight Bearing Restrictions: No       Mobility Bed Mobility Overal bed mobility: Needs Assistance Bed Mobility: Supine to Sit     Supine to sit: Supervision     General bed mobility comments: pt received and left in recliner  Transfers Overall transfer level: Needs assistance Equipment used: None Transfers: Sit to/from Stand Sit to Stand: Min guard         General  transfer comment: Min Guard A for safety    Balance Overall balance assessment: Needs assistance Sitting-balance support: No upper extremity supported;Feet supported Sitting balance-Leahy Scale: Good     Standing balance support: No upper extremity supported;During functional activity Standing balance-Leahy Scale: Fair                             ADL either performed or assessed with clinical judgement   ADL Overall ADL's : Needs assistance/impaired     Grooming: Wash/dry hands;Wash/dry face;Standing;Min guard Grooming Details (indicate cue type and reason): Cuing pt to perform hand hygiene at sink after toileting, pt getting to sink and then washing her face. Requiring Max cues to recall that she was asked to wash her hands.             Lower Body Dressing: Min guard;Sit to/from stand Lower Body Dressing Details (indicate cue type and reason): Pt donning new underwear with Min Guard A for safety. Significant time for problem sovling and sequencing Toilet Transfer: Min guard;Stand-pivot;BSC   Toileting- Clothing Manipulation and Hygiene: Min guard;Sit to/from stand       Functional mobility during ADLs: Min guard General ADL Comments: Pt presenting with decreased cognition, balance, strength, and safety.     Vision   Additional Comments: R inattention   Perception     Praxis      Cognition Arousal/Alertness: Awake/alert Behavior During Therapy: WFL for tasks assessed/performed Overall Cognitive Status: Impaired/Different from baseline Area of Impairment: Attention;Memory;Following commands;Safety/judgement;Awareness;Problem solving                   Current  Attention Level: Sustained Memory: Decreased short-term memory Following Commands: Follows one step commands with increased time;Follows multi-step commands inconsistently Safety/Judgement: Decreased awareness of safety;Decreased awareness of deficits Awareness: Emergent Problem Solving:  Difficulty sequencing General Comments: Upon arrival, pt attempting to get OOB to use bathroom (urinary urgency). Requiring cues to wait and maintain safety. Pt continues to present with poro safety, attention, and awareness. Pt demonstrate poor selective attention and easily distracted by external stimuli. Pt requiring cues for safety and problem solving        Exercises     Shoulder Instructions       General Comments pt tachy up to 137 observed on monitor. Pt reports some lightheadedness with mobility, BP of 107/73 taken after return to room. Pt's HA and reports of dizziness/lightheadedness limiting activity tolerance this session    Pertinent Vitals/ Pain       Pain Assessment: No/denies pain Pain Score: 9  Pain Location: head Pain Descriptors / Indicators: Headache Pain Intervention(s): Patient requesting pain meds-RN notified  Home Living                                          Prior Functioning/Environment              Frequency  Min 2X/week        Progress Toward Goals  OT Goals(current goals can now be found in the care plan section)  Progress towards OT goals: Progressing toward goals  Acute Rehab OT Goals Patient Stated Goal: to return to independent mobility OT Goal Formulation: Patient unable to participate in goal setting Potential to Achieve Goals: Fair ADL Goals Additional ADL Goal #1: pt will tolerate eob sitting 5 minutes with stable VSS Additional ADL Goal #2: Pt will complete 2 step command 50% of attempts Additional ADL Goal #3: pt will demonstrate sustained attention for adl  Plan Discharge plan remains appropriate    Co-evaluation                 AM-PAC OT "6 Clicks" Daily Activity     Outcome Measure   Help from another person eating meals?: A Little Help from another person taking care of personal grooming?: A Little Help from another person toileting, which includes using toliet, bedpan, or urinal?: A  Little Help from another person bathing (including washing, rinsing, drying)?: A Little Help from another person to put on and taking off regular upper body clothing?: A Little Help from another person to put on and taking off regular lower body clothing?: A Little 6 Click Score: 18    End of Session Equipment Utilized During Treatment: Gait belt  OT Visit Diagnosis: Unsteadiness on feet (R26.81);Muscle weakness (generalized) (M62.81)   Activity Tolerance Patient tolerated treatment well   Patient Left in chair;with call bell/phone within reach;with chair alarm set;with nursing/sitter in room   Nurse Communication Mobility status;Precautions        Time: 0626-9485 OT Time Calculation (min): 23 min  Charges: OT General Charges $OT Visit: 1 Visit OT Treatments $Self Care/Home Management : 23-37 mins  [place], OTR/L Acute Rehab Pager: [phone] Office: [place] 02/27/2020, 1:06 PM

## 2020-02-27 NOTE — Progress Notes (Signed)
STROKE TEAM PROGRESS NOTE  INTERVAL HISTORY No overnight events.  Patient is evaluated at bedside this morning.  No family present in the room.  She is alert, active, cooperative with exam.  She is on Pradaxa. She is afebrile. Vitals stable.  Neuro exam unchanged, still have mild right hand and right leg weakness which is continue to improve. Speech has improved but still has word finding difficulty .Pt is able to name only 6 four legged animals. Pt is able to read fluently from pamphlet. No paraphasic errors. Hematocrit stable.  Iv fluids stopped yesterday. Pt is tolerating Orally. Patient is medically stable for rehab awaiting bed availability.  Vitals:   02/26/20 2007 02/27/20 0018 02/27/20 0405 02/27/20 0755  BP: 113/71 110/74 111/61 99/60  Pulse: (!) 104 (!) 105 (!) 102 94  Resp: 18 18 19 16   Temp: 97.9 F (36.6 C) 98.2 F (36.8 C) 98 F (36.7 C) 98.6 F (37 C)  TempSrc: Oral Oral Oral Oral  SpO2: 99% 100% 99% 99%  Weight:      Height:       CBC:  Recent Labs  Lab 02/26/20 0830 02/27/20 0500  WBC 6.5 7.4  NEUTROABS 3.7  --   HGB 8.6* 8.8*  HCT 27.9* 28.4*  MCV 94.3 95.3  PLT 465* 725*   Basic Metabolic Panel:  Recent Labs  Lab 02/21/20 0122 02/22/20 0041 02/25/20 0500 02/26/20 0830  NA 137   < > 139 139  K 3.1*   < > 3.2* 3.5  CL 106   < > 107 105  CO2 25   < > 26 27  GLUCOSE 129*   < > 124* 106*  BUN <5*   < > <5* <5*  CREATININE 0.37*   < > 0.39* 0.43*  CALCIUM 8.9   < > 8.7* 8.9  MG 2.0  --   --   --    < > = values in this interval not displayed.   Lipid Panel:  No results for input(s): CHOL, TRIG, HDL, CHOLHDL, VLDL, LDLCALC in the last 168 hours.  IMAGING past 24 hours  MR Brain 02/18/2020 1. Motion degraded exam. 2. 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 3. Restricted diffusion at site of an acute/subacute hemorrhagic infarct within the right callosal  splenium has subtly increased in extent. 4. T2/FLAIR hyperintensity within the deep gray nuclei persists but has diminished. However, there may be a new punctate acute infarct within the left thalamocapsular junction. 5. 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 6. Persistent trace subarachnoid hemorrhage along the left frontoparietal convexity.  MRV Head 02/18/2020 1. Flow within the superior sagittal sinus has significantly improved since the prior MRV of 02/15/2020. However, there are persistent foci of nonocclusive thrombus within this vessel. 2. Nonocclusive thrombus within the left transverse and proximal sigmoid dural venous sinuses has increased. 3. Persistent nonocclusive thrombus within the right transverse sinus. 4. Persistent near occlusive or occlusive thrombus within the right sigmoid sinus and visualized right internal jugular vein. 5. The degree of flow within the deep venous system is unchanged. 6. The veins of [place] and a few additional cerebral veins overlying the bilateral frontoparietal convexities remain thrombosed.  Echocardiogram 02/16/2020 IMPRESSIONS  1. Left ventricular ejection fraction, by estimation, is 55 to 60%. The  left ventricle has normal function. The left ventricle has no regional  wall motion abnormalities. Left ventricular diastolic parameters  were  normal.  2. Right ventricular systolic function is normal. The right ventricular  size is normal. There is normal pulmonary artery systolic pressure.  3. Mild mitral valve leaflet thickening and calcification. Trivial mitral  valve regurgitation.  4. The aortic valve is tricuspid. There is mild thickening of the aortic  valve. Aortic valve regurgitation is not visualized. No aortic stenosis is  present.  5. The inferior vena cava is normal in size with <50% respiratory  variability, suggesting right atrial pressure of 8 mmHg.    CT Head wo contrast 02/16/2020 IMPRESSION: 1. Continued interval decrease in conspicuity of scattered small volume subarachnoid hemorrhage involving both frontoparietal convexities, now only faintly visible and nearly resolved. 2. Continued interval evolution of bilateral watershed territory infarcts, most pronounced at the left frontoparietal convexity. Overall size and distribution is relatively similar. No hemorrhagic transformation by CT. 3. Persistent but improved hyperdensity about the veins of Trolard since prior CT. Likewise, the remainder of the dural sinuses also appear less dense and engorged as compared to previous. 4. No other new acute intracranial abnormality.  CT Head wo contrast 02/14/2020 IMPRESSION: Small volume bilateral frontoparietal and basilar cistern subarachnoid hemorrhage is unchanged. No new intracranial hemorrhage. Small bilateral cerebral infarcts are not well demonstrated on this exam. Increased conspicuity of left cerebral edema. Hyperdense appearance of the cortical veins, superior sagittal and right transverse sinus is less conspicuous than prior exam.  IR Thrombectomy 02/13/2020 Findings. 1.Extensive thrombus load in the TS bilaterally,the Rt SS ,RT IJV and post one third of the SSS. Large chunks of clot removed with revascularization of the TS bilaterally and partially the RT SS and the torcula. Sig decreased venous congestion of the cortical veins noted post procedure. Post CT brain No ICH or mass effect or hydrocephalus. Clinically stable with pupils 88mm sluggish.  CT Head wo contrast 02/12/2020 IMPRESSION: 1. The study is positive for a small volume of subarachnoid hemorrhage. There is no midline shift. 2. Hyperdense dural sinuses and cortical veins especially on the right in addition to a filling defect within the transverse sinus on the right is concerning for dural venous thrombosis and may be the source of the patient's  subarachnoid hemorrhage. Emergent MRI/MRV is recommended for further evaluation.  MR Brain wo contrast MR Venogram Head 02/12/2020 IMPRESSION: 1. Extensive, acute dural venous sinus thrombosis involving the superior sagittal sinus into the right internal jugular vein via the dominant right transverse and sigmoid system. Both veins of Trolard are thrombosed and there is straight sinus/deep cerebral thrombosis as well. 2. Deep watershed white matter infarcts, small volume subarachnoid hemorrhage, and left more than right frontoparietal cortical swelling due to #1.  MR Brain wo contrast MR Venogram Head 02/15/2020  Brain MRI IMPRESSION: 1. Progressive left perirolandic venous infarction and petechial hemorrhage. 2. New edema in the deep gray nuclei without infarction.  Intracranial MRV: IMPRESSION: 1. Improved flow especially in the deep venous system. Improved flow also seen in the proximal right transverse sinus. 2. Occlusive clot still seen in the superior sagittal, distal right transverse, and right sigmoid dural sinuses. No recanalization of the veins of Trolard.  EEG adult 02/12/2020  ABNORMALITY -Continue slow, generalized IMPRESSION: This study is suggestive of moderate diffuse encephalopathy, nonspecific etiology.  At 1531, patient was noted to have right upper extremity jerking.  Concomitant EEG showed high amplitude 2 to 3 Hz rhythmic delta slowing in bitemporal region with evolution in morphology and amplitude but without definite evolution in frequency concerning for focal motor seizure.  EEG adult  02/16/2020 IMPRESSION: This study is suggestive of moderate to severe diffuse encephalopathy, nonspecific etiology.  No seizures or epileptiform discharges were seen throughout the recording.  PHYSICAL EXAM GENERAL: Well developed, young african american lady  sitting comfortably in bed, NAD Head: Normocephalic and atraumatic. EENT: No OP obstruction, normal  conjunctiva.  LUNGS - Normal respiratory effort on room air CV - No peripheral edema.  ABDOMEN - Soft, non-distended Ext: warm, well perfused.  Neurological:  Mental Status: Alert, awake, oriented to time, person, place and situation.  Follow midline and appendicular commands.  Speech is improving  with some word finding difficulties. Pt is able to name only 6 four legged animals. Pt is able to read fluently from pamphlet. No paraphasic errors.  Good comprehension.   Cranial Nerves: PERRL, Pt is not able to follow finger but observed to move eyes in all direction otherwise. visual fields full, no facial asymmetry, facial sensation intact, hearing intact, tongue/uvula/soft palate midline, normal sternocleidomastoid and trapezius muscle strength. No evidence of tongue atrophy or fibrillations.  Motor: Mild Drift in Rt Upper Extremity, No Drift in LE's. Strength 4+/5 in Rt UE, mild weakness in RT hand demonstrated on rapid tapping movements, 5 /5 in Lt UE, Strength in Rt LE  4/5, Lt LE 4/5. Poor effort observed.  Reflexes- UE -2+  , LE- 2+,  Tone: is normal and bulk is normal. Sensation- Intact to light touch bilaterally. Coordination: FTN intact bilaterally, no ataxia in BLE. Gait- deferred  ASSESSMENT/PLAN Ms. Jill Clarke is a 35 y.o. female with PMHx of asthma and sickle cell trait presented with altered mental status.  CT head revealed a small volume subarachnoid hemorrhage and hyperdense dural sinuses with filling defect concerning for dural venous thrombosis.  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  Extremely delicate situation, that is why, IR is consulted for thrombectomy and patient is with IR now. Pt is medically stable for Rehab.  CVST - involving SSS, straight sinus, deep cerebral vein, right TS, SS and IJ as well as b/l veins of [place]ard, likely due to dehydration at this point but looking into hypercoagulable  state Venous infarcts - Deep watershed white matter infarct, left frontoparietal cortical infarct and small volume subarachnoid hemorrhage   Code Stroke CT head: positive for a small volume of SAH.    MRI  and MRV 02/12/20 A). Extensive, acute dural venous sinus thrombosis involving the superior sagittal sinus into the right internal jugular vein via the dominant right transverse and sigmoid system. B).Both veins of Trolard are thrombosed and there is straight sinus/deep cerebral thrombosis as well. C). Deep watershed white matter infarcts, small volume subarachnoid hemorrhage, and left more than right frontoparietal cortical swelling due to #1.  IR Thrombectomy: 02/13/2020- Large chunks of clot removed with revascularization of the TS bilaterally and partially the RT SS and the torcula.Sig decreased venous congestion of the cortical veins noted post procedure.  MRI and MRV 02/15/20 - Improved flow especially in the deep venous system. Improved flow also seen in the proximal right transverse sinus. Occlusive clot still seen in the superior sagittal, distal right transverse, and right sigmoid dural sinuses. No recanalization of the veins of Trolard. Progressive left perirolandic venous infarction and petechial hemorrhage. New edema in the deep gray nuclei without infarction.  IR Thrombectomy: 02/15/2020 - complete revascularization of SSS,Straight S,V of [place] and ICVs ,and RT TS. Sig focal stenosis at the RT TS/SS junction angioplastied with  balloon with  improved flow into RT SS.  MRI and MRV 02/18/20: acute/subacute left frontoparietal and right CC hemorrhagic venous infarct, b/l deep watershed white matter infarcts appear similar to the prior MRI. Flow within the SSS has significantly improved since the prior MRV of 02/15/2020. However, there are persistent foci of occlusive or nonocclusive thrombus within SSS, b/l TS, b/l SS, and right IJ. Degree of flow within deep venous system unchanged, veins of  [place]rolard remain to be thrombosed  2D Echo - EF is 55 to 60%.  LDL 108  HgbA1c 4.9   Hypercoagulable panel: mildly decreased protein S, but may be an acute phase reactant and should be rechecked as an outpatient in 6 to 8 weeks.  Repeat COVID testing: negative on 02/14/20 and on 02/11/20  VTE prophylaxis - Was on IV heparin now on Pradaxa.   No antithrombotic prior to admission, was on IV heparin now on Pradaxa 150 mg twice daily.   On amantadine to facilitate arousal  Therapy recommendations: CIR - pending bed availability.   Disposition: Medically stable for d/c CIR pending bed availability.     Cerebral edema   On D5 with NS > Discontinued (02/26/20)  MRI 1/23 new edema in the deep gray nuclei without infarction  MRI 1/26: Dominant acute/subacute left frontoparietal hemorrhagic venous infarct not significantly changed from MRI done on 1/23.   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  on keppra 500 bid now.  Repeat EEG 02/16/20 - moderate to severe diffuse encephalopathy  Continue keppra  Respiratory failure  Intubated on sedation -> extubated 1/25  Tolerating well so far  Hypotension  BP stable on the low end  Phenylephrine started now off  . On IVF and TF . BP goal 120-160 . Long-term BP goal normotensive  Anemia - acute blood loss  Hgb - 11.0->8.2->8.6->7.2->7.0->8.2->8.0->8.2>9.4>8.9>9.5>9.6 >8.6  May related to repeated procedure and IV heparin- Heparin stopped now on Pradaxa.   1 unit PRBC transfusion on 02/16/2020  CBC monitoring  Hyperlipidemia  Home meds: None  LDL 108, goal < 70  On Lipitor 40 mg  Continue statin at discharge  Dysphagia   Off OG  02/17/20  Modified Barium Swallow - There was no penetration/aspiration. Recommended advancing diet to regular solids, thin liquids; continue to crush meds.  Regular diet  Other Stroke Risk Factors  Sickle Cell Trait: No associated risk found due to sickle cell trait.    Other Active Problems  Asthma   Hypokalemia - potassium - 2.9->3.0->3.6->3.1 >3.8->2.8->3.4->3.1-> supplement ->3.3-> supplement>3.3>supplement -> 3.5  Leukocytosis - WBC's - 13.3->15.9 (afebrile) >15.2->12.3->10.3>6.5>7.4 (afebrile)  Mastoiditis -> Unasyn started 02/18/20.   Hospital day # 15  Continue Pradaxa and patient encouraged to drink plenty of fluids.  Medically stable to be transferred to inpatient rehab hopefully tomorrow when bed becomes available Jill Contras, MD To contact Stroke Continuity provider, please refer to [email or web address] After hours, contact General Neurology

## 2020-02-27 NOTE — Plan of Care (Signed)

## 2020-02-28 ENCOUNTER — Inpatient Hospital Stay (HOSPITAL_COMMUNITY)
Admission: RE | Admit: 2020-02-28 | Discharge: 2020-03-05 | DRG: 056 | Disposition: A | Discharge status: Home / Self Care | Payer: 59 | Source: Intra-hospital | Attending: Physical Medicine & Rehabilitation | Admitting: Physical Medicine & Rehabilitation

## 2020-02-28 ENCOUNTER — Encounter (HOSPITAL_COMMUNITY): Payer: Self-pay | Admitting: Physical Medicine & Rehabilitation

## 2020-02-28 DIAGNOSIS — R569 Unspecified convulsions: Secondary | ICD-10-CM

## 2020-02-28 DIAGNOSIS — G9349 Other encephalopathy: Secondary | ICD-10-CM | POA: Diagnosis present

## 2020-02-28 DIAGNOSIS — J45909 Unspecified asthma, uncomplicated: Secondary | ICD-10-CM | POA: Diagnosis present

## 2020-02-28 DIAGNOSIS — D62 Acute posthemorrhagic anemia: Secondary | ICD-10-CM | POA: Diagnosis present

## 2020-02-28 DIAGNOSIS — H7090 Unspecified mastoiditis, unspecified ear: Secondary | ICD-10-CM | POA: Diagnosis present

## 2020-02-28 DIAGNOSIS — E876 Hypokalemia: Secondary | ICD-10-CM

## 2020-02-28 DIAGNOSIS — I69098 Other sequelae following nontraumatic subarachnoid hemorrhage: Secondary | ICD-10-CM

## 2020-02-28 DIAGNOSIS — G936 Cerebral edema: Secondary | ICD-10-CM | POA: Diagnosis present

## 2020-02-28 DIAGNOSIS — Z87891 Personal history of nicotine dependence: Secondary | ICD-10-CM

## 2020-02-28 DIAGNOSIS — I959 Hypotension, unspecified: Secondary | ICD-10-CM | POA: Diagnosis present

## 2020-02-28 DIAGNOSIS — D573 Sickle-cell trait: Secondary | ICD-10-CM | POA: Diagnosis present

## 2020-02-28 DIAGNOSIS — R5383 Other fatigue: Secondary | ICD-10-CM

## 2020-02-28 DIAGNOSIS — I6901 Attention and concentration deficit following nontraumatic subarachnoid hemorrhage: Principal | ICD-10-CM

## 2020-02-28 DIAGNOSIS — R414 Neurologic neglect syndrome: Secondary | ICD-10-CM | POA: Diagnosis present

## 2020-02-28 DIAGNOSIS — G08 Intracranial and intraspinal phlebitis and thrombophlebitis: Secondary | ICD-10-CM

## 2020-02-28 DIAGNOSIS — E119 Type 2 diabetes mellitus without complications: Secondary | ICD-10-CM | POA: Diagnosis present

## 2020-02-28 DIAGNOSIS — I69018 Other symptoms and signs involving cognitive functions following nontraumatic subarachnoid hemorrhage: Secondary | ICD-10-CM

## 2020-02-28 DIAGNOSIS — I6602 Occlusion and stenosis of left middle cerebral artery: Secondary | ICD-10-CM

## 2020-02-28 DIAGNOSIS — I69011 Memory deficit following nontraumatic subarachnoid hemorrhage: Secondary | ICD-10-CM

## 2020-02-28 DIAGNOSIS — E8809 Other disorders of plasma-protein metabolism, not elsewhere classified: Secondary | ICD-10-CM | POA: Diagnosis present

## 2020-02-28 DIAGNOSIS — Z79899 Other long term (current) drug therapy: Secondary | ICD-10-CM | POA: Diagnosis not present

## 2020-02-28 DIAGNOSIS — I6330 Cerebral infarction due to thrombosis of unspecified cerebral artery: Secondary | ICD-10-CM | POA: Diagnosis present

## 2020-02-28 LAB — CBC
HCT: 27.3 % — ABNORMAL LOW (ref 36.0–46.0)
Hemoglobin: 8.4 g/dL — ABNORMAL LOW (ref 12.0–15.0)
MCH: 29.4 pg (ref 26.0–34.0)
MCHC: 30.8 g/dL (ref 30.0–36.0)
MCV: 95.5 fL (ref 80.0–100.0)
Platelets: 462 10*3/uL — ABNORMAL HIGH (ref 150–400)
RBC: 2.86 MIL/uL — ABNORMAL LOW (ref 3.87–5.11)
RDW: 14.8 % (ref 11.5–15.5)
WBC: 6 10*3/uL (ref 4.0–10.5)
nRBC: 0 % (ref 0.0–0.2)

## 2020-02-28 LAB — BASIC METABOLIC PANEL WITH GFR
Anion gap: 9 (ref 5–15)
BUN: 6 mg/dL (ref 6–20)
CO2: 28 mmol/L (ref 22–32)
Calcium: 9.2 mg/dL (ref 8.9–10.3)
Chloride: 102 mmol/L (ref 98–111)
Creatinine, Ser: 0.45 mg/dL (ref 0.44–1.00)
GFR, Estimated: >60 mL/min
Glucose, Bld: 114 mg/dL — ABNORMAL HIGH (ref 70–99)
Potassium: 3.6 mmol/L (ref 3.5–5.1)
Sodium: 139 mmol/L (ref 135–145)

## 2020-02-28 LAB — GLUCOSE, CAPILLARY
Glucose-Capillary: 94 mg/dL (ref 70–99)
Glucose-Capillary: 94 mg/dL (ref 70–99)
Glucose-Capillary: 99 mg/dL (ref 70–99)

## 2020-02-28 MED ORDER — PROCHLORPERAZINE MALEATE 5 MG PO TABS: 5.0000 mg | ORAL_TABLET | Freq: Four times a day (QID) | ORAL | Status: DC | PRN

## 2020-02-28 MED ORDER — PANTOPRAZOLE SODIUM 40 MG PO TBEC
40.0000 mg | DELAYED_RELEASE_TABLET | Freq: Every day | ORAL | Status: DC
  Administered 2020-02-29 – 2020-03-05 (×6): 40 mg via ORAL
  Filled 2020-02-28 (×6): qty 1

## 2020-02-28 MED ORDER — ALBUTEROL SULFATE (2.5 MG/3ML) 0.083% IN NEBU: 2.5000 mg | INHALATION_SOLUTION | RESPIRATORY_TRACT | Status: DC | PRN

## 2020-02-28 MED ORDER — GUAIFENESIN-DM 100-10 MG/5ML PO SYRP: 5.0000 mL | ORAL_SOLUTION | Freq: Four times a day (QID) | ORAL | Status: DC | PRN

## 2020-02-28 MED ORDER — FLEET ENEMA 7-19 GM/118ML RE ENEM: 1.0000 | ENEMA | Freq: Once | RECTAL | Status: DC | PRN

## 2020-02-28 MED ORDER — LORAZEPAM 2 MG/ML IJ SOLN: 1.0000 mg | INTRAMUSCULAR | Status: DC | PRN

## 2020-02-28 MED ORDER — ORAL CARE MOUTH RINSE
15.0000 mL | Freq: Two times a day (BID) | OROMUCOSAL | Status: DC
  Administered 2020-02-28 – 2020-03-03 (×5): 15 mL via OROMUCOSAL

## 2020-02-28 MED ORDER — ALUM & MAG HYDROXIDE-SIMETH 200-200-20 MG/5ML PO SUSP: 30.0000 mL | ORAL | Status: DC | PRN

## 2020-02-28 MED ORDER — ENSURE ENLIVE PO LIQD
237.0000 mL | Freq: Two times a day (BID) | ORAL | Status: DC
  Administered 2020-02-28 – 2020-03-04 (×9): 237 mL via ORAL

## 2020-02-28 MED ORDER — ATORVASTATIN CALCIUM 40 MG PO TABS
40.0000 mg | ORAL_TABLET | Freq: Every day | ORAL | Status: DC
  Administered 2020-02-29 – 2020-03-05 (×6): 40 mg via ORAL
  Filled 2020-02-28 (×6): qty 1

## 2020-02-28 MED ORDER — DOCUSATE SODIUM 50 MG/5ML PO LIQD
100.0000 mg | Freq: Two times a day (BID) | ORAL | Status: DC
  Administered 2020-02-28: 100 mg via ORAL
  Filled 2020-02-28: qty 10

## 2020-02-28 MED ORDER — TRAZODONE HCL 50 MG PO TABS
25.0000 mg | ORAL_TABLET | Freq: Every evening | ORAL | Status: DC | PRN
  Administered 2020-03-01: 50 mg via ORAL
  Filled 2020-02-28: qty 1

## 2020-02-28 MED ORDER — SODIUM CHLORIDE 0.9 % IV SOLN
3.0000 g | Freq: Four times a day (QID) | INTRAVENOUS | Status: AC
  Administered 2020-02-28 (×2): 3 g via INTRAVENOUS
  Filled 2020-02-28 (×2): qty 3

## 2020-02-28 MED ORDER — DIPHENHYDRAMINE HCL 12.5 MG/5ML PO ELIX: 12.5000 mg | ORAL_SOLUTION | Freq: Four times a day (QID) | ORAL | Status: DC | PRN

## 2020-02-28 MED ORDER — POLYETHYLENE GLYCOL 3350 17 G PO PACK: 17.0000 g | PACK | Freq: Every day | ORAL | Status: DC | PRN

## 2020-02-28 MED ORDER — DOCUSATE SODIUM 100 MG PO CAPS
100.0000 mg | ORAL_CAPSULE | Freq: Two times a day (BID) | ORAL | Status: DC
  Administered 2020-02-29 – 2020-03-05 (×11): 100 mg via ORAL
  Filled 2020-02-28 (×11): qty 1

## 2020-02-28 MED ORDER — LEVETIRACETAM 500 MG PO TABS
500.0000 mg | ORAL_TABLET | Freq: Two times a day (BID) | ORAL | Status: DC
  Administered 2020-02-28 – 2020-03-05 (×12): 500 mg via ORAL
  Filled 2020-02-28 (×12): qty 1

## 2020-02-28 MED ORDER — POLYETHYLENE GLYCOL 3350 17 G PO PACK
17.0000 g | PACK | Freq: Every day | ORAL | Status: DC
  Administered 2020-02-29 – 2020-03-04 (×5): 17 g via ORAL
  Filled 2020-02-28 (×5): qty 1

## 2020-02-28 MED ORDER — AMANTADINE HCL 100 MG PO CAPS
100.0000 mg | ORAL_CAPSULE | Freq: Two times a day (BID) | ORAL | Status: DC
  Administered 2020-02-29 – 2020-03-01 (×4): 100 mg via ORAL
  Filled 2020-02-28 (×4): qty 1

## 2020-02-28 MED ORDER — DABIGATRAN ETEXILATE MESYLATE 150 MG PO CAPS
150.0000 mg | ORAL_CAPSULE | Freq: Two times a day (BID) | ORAL | Status: DC
  Administered 2020-02-28 – 2020-03-05 (×12): 150 mg via ORAL
  Filled 2020-02-28 (×13): qty 1

## 2020-02-28 MED ORDER — AMANTADINE HCL 50 MG/5ML PO SOLN
100.0000 mg | Freq: Two times a day (BID) | ORAL | Status: DC
  Administered 2020-02-28: 100 mg via ORAL
  Filled 2020-02-28 (×2): qty 10

## 2020-02-28 MED ORDER — BISACODYL 10 MG RE SUPP: 10.0000 mg | Freq: Every day | RECTAL | Status: DC | PRN

## 2020-02-28 MED ORDER — PROCHLORPERAZINE 25 MG RE SUPP: 12.5000 mg | Freq: Four times a day (QID) | RECTAL | Status: DC | PRN

## 2020-02-28 MED ORDER — ACETAMINOPHEN 325 MG PO TABS
325.0000 mg | ORAL_TABLET | ORAL | Status: DC | PRN
  Administered 2020-03-01 – 2020-03-05 (×2): 650 mg via ORAL
  Filled 2020-02-28 (×2): qty 2

## 2020-02-28 MED ORDER — PROCHLORPERAZINE EDISYLATE 10 MG/2ML IJ SOLN: 5.0000 mg | Freq: Four times a day (QID) | INTRAMUSCULAR | Status: DC | PRN

## 2020-02-28 NOTE — IPOC Note (Signed)
Individualized overall Plan of Care [facility]) Patient Details Name: Jill Clarke MRN: [id number] DOB: July 14, 1985  Admitting Diagnosis: Cerebral thrombosis with cerebral infarction [facility])  Hospital Problems: Principal Problem:   Cerebral thrombosis with cerebral infarction Active Problems:   Cerebral vein thrombosis   Hypotension     Functional Problem List: Nursing Safety,Perception,Motor,Nutrition,Endurance  PT Balance,Endurance,Safety,Motor  OT Balance,Cognition,Endurance,Motor,Safety,Behavior,Perception  SLP Cognition  TR         Basic ADL's: OT Bathing,Dressing,Toileting     Advanced  ADL's: OT Simple Meal Preparation     Transfers: PT Floor,Furniture,Car,Bed to Chair,Bed Mobility  OT Toilet,Tub/Shower     Locomotion: PT Ambulation,Stairs     Additional Impairments: OT None  SLP Social Cognition   Problem Solving,Memory,Attention,Awareness  TR      Anticipated Outcomes Item Anticipated Outcome  Self Feeding no goal set  Swallowing      Basic self-care  mod I  Toileting  mod I   Bathroom Transfers supervision  Bowel/Bladder  Pt will maintain continence of bowel and bladder during hospital stay.  Transfers  mod I  Locomotion  supervision  Communication     Cognition  supervision  Pain  Pt will maintain tolerable pain level during hospital stay.  Safety/Judgment  Pt will maintain safety/judgement awareness during hospital stay.   Therapy Plan: PT Intensity: Minimum of 1-2 x/day ,45 to 90 minutes PT Frequency: 5 out of 7 days PT Duration Estimated Length of Stay: 7-10 days OT Intensity: Minimum of 1-2 x/day, 45 to 90 minutes OT Frequency: 5 out of 7 days OT Duration/Estimated Length of Stay: 7-10 weeks SLP Intensity: Minumum of 1-2 x/day, 30 to 90 minutes SLP Frequency: 3 to 5 out of 7 days SLP Duration/Estimated Length of Stay: 7-10 days    Team Interventions: Nursing Interventions Patient/Family Education,Discharge  Planning,Psychosocial Support,Cognitive Remediation/Compensation,Medication Management  PT interventions Ambulation/gait training,Balance/vestibular training,Cognitive remediation/compensation,Community reintegration,Discharge planning,Functional mobility training,Neuromuscular re-education,Pain management,Patient/family education,Psychosocial support,Stair training,Therapeutic Activities,Therapeutic Exercise,UE/LE Strength taining/ROM,UE/LE Coordination activities,Visual/perceptual remediation/compensation  OT Interventions Balance/vestibular training,Discharge planning,Pain management,Self Care/advanced ADL retraining,Therapeutic Activities,UE/LE Coordination activities,Cognitive remediation/compensation,Functional mobility training,Patient/family education,Therapeutic Exercise,DME/adaptive equipment instruction,Psychosocial support,UE/LE Strength taining/ROM  SLP Interventions Cognitive remediation/compensation,Cueing hierarchy,Patient/family education,Functional tasks,Internal/external aids  TR Interventions    SW/CM Interventions     Barriers to Discharge MD  Medical stability  Nursing      PT Home environment access/layout,Behavior (decreased safety awareness, impaired memory)    OT      SLP      SW       Team Discharge Planning: Destination: PT-Home ,OT- Home , SLP-Home Projected Follow-up: PT-Outpatient PT, OT-  Outpatient OT, SLP-24 hour supervision/assistance,Home Health SLP,Outpatient SLP Projected Equipment Needs: PT-None recommended by PT, OT- To be determined, SLP-None recommended by SLP Equipment Details: PT- , OT-  Patient/family involved in discharge planning: PT- Patient,  OT-Patient, SLP-Patient  MD ELOS: 6-9 days. Medical Rehab Prognosis:  Good Assessment: 35 year old left handed female with history of T2DM, sickle cell trait who was admitted on 02/12/2020 with one week history of N/V, headaches and diarrhea. History taken from chart review and patient. She was seen in  the ED on 02/10/2020-work-up was negative and she was discharged home.  She was readmitted on 02/12/2020 with nausea and MRI brain done revealing patchy acute infarcts in bilateral cerebral deep watershed white matter and along high left sylvian fissure with focal swelling and small volume SAH. MRV brain showed dural venous sinus thrombosis involving superior sagittal sinus into the right jugular vein, straight sinus/deep cerebral thrombosis and both veins  of Trolard were thrombosed. SAH felt to be in setting of significant thrombosis and she was started on hypertonic saline. She underwent cerebral angio with revascularization of occluded right and left transverse sinuses and partially of right sigmoid sinus with improvement in flow and decompression of venous system. She was noted to be somnolent with decreased movement on the right.  EEG was done, which showed moderate diffuse encephalopathy with delta slowing and bitemporal region. 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  Hospital course further complicated by hypercoagulability, panel showed low protein S and recommendations are to repeat in 6 to 8 weeks as this may be due to acute phase reaction. Cerebral edema treated with hypertonic saline and phenylephrine added to help manage hypotension with SBP goal 120-160. Repeat MRV showed improved flow but occlusive clot still seen in superior sinus, distal right transverse and right sigmoid dural sinus with lack of recanalization of the vein of Trolard. She underwent repeat cerebral angio 01/23  with complete revascularization of SSS, straight sinus, vein of Galen, ICD and right transverse sinus.  Follow-up EEG showed diffuse encephalopathy and Dr. [name] recommended continuing [place] for now. She was started on IV heparin and has been transitioned to Pradaxa on 01/31. She tolerated extubation without difficulty and diet has been advanced to regular diet. She was started on vancomycin and  cefepime for mastoiditis on 02/15/2020-->narrowed to Unasyn. She continues to be limtied by HA, dizziness/lightheadedness, balance deficits with poor attention and tendency to bump into items on the right as well as difficulty completing ADL tasks. She has had decline in ability to carry out ADLs and as well as mobility therefore CIR recommended for progressive therapies.  Will set goals for mod I with PT/OT and supervision with SLP.  Due to the current state of emergency, patients may not be receiving their 3-hours of Medicare-mandated therapy.  See Team Conference Notes for weekly updates to the plan of care

## 2020-02-28 NOTE — Progress Notes (Signed)
Report called to RN  for admission to Rehab; all belongings packed and patient transferred via wheelchair to 4W.

## 2020-02-28 NOTE — Progress Notes (Signed)
Inpatient Rehabilitation Medication Review by a Pharmacist  A complete drug regimen review was completed for this patient to identify any potential clinically significant medication issues.  Clinically significant medication issues were identified:  no  Check AMION for pharmacist assigned to patient if future medication questions/issues arise during this admission.  Time spent performing this drug regimen review (minutes):  [place], PharmD PGY1 Pharmacy Resident 02/28/2020 1:01 PM  Please check [email or web address] for unit-specific pharmacy phone numbers.

## 2020-02-28 NOTE — Progress Notes (Signed)
Signed             Physical Medicine and Rehabilitation Consult  Reason for Consult:Stroke with functional deficits.  Referring Physician: Dr. Erlinda Hong   HPI: Jill Clarke is a 35 y.o. Left handed female with history of T2DM, Sickle cell trait who was admitted on 02/12/20 with one week history of H/V, HA and diarrhea. She was seen in ED 02/10/20 with negative work up and discharged to home. She was readmitted on  02/11/20 with nausea and MRI brain done revealing patchy acute infarcts in bilateral cerebral deep watershed white matter and along high left sylvian fissure with focal swelling and small volume SAH. 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  SAH felt to be in setting of significant thrombosis. She was started on hypertonic saline and underwent cerebral angio with revascularization of occluded right and left transverse sinuses and partially of the right sigmoid sinus with improvement in flow and decompression of venous system. She was noted to be somnolent with decreased moment on the right.  and EEG done due to lethargy and showed moderate diffuse encephalopathy with delta slowing in bitemporal region and RUE jerking without definite changes concerning for focal motor seizure--started on Keppra 1000 mg bid. Hypercoagulopathy panel showed low proteins S --to be repeated in 6-8 weeks as may be acute phase reaction.   Repeat MRV showed improved flow but occlusive clot still seen in superior sinus, distal right transverse and right sigmoid dural sinuses and lack of recanalization of vein of Trolard. She underwent repeat cerebral angio with complete revascularization of SSS, straight sinus, vein of Galen, ICV and right transverse sinus on 01/23. Follow up EEK showed diffuse encephalopathy-->Dr. [name] recommends continuing Keppra bid. On IV heparin  and Vanc/Cefepime added due to possible mastoiditis. She tolerated extubation by 01/25 and started on dysphagia 1, nectars as mentation improved. Transitioned to pradaxa today. Physical Medicine & Rehabilitation was consulted to assess candidacy for CIR given left gaze preference, cognitive deficits, hallucinations, right?left side weakness, delay in processing, and poor awareness of deficits.     Review of Systems  Unable to perform ROS: Mental acuity  Constitutional: Negative for chills and fever.  Eyes: Double vision: on the right today.  Respiratory: Negative for cough and shortness of breath.   Cardiovascular: Negative for chest pain and leg swelling.  Gastrointestinal: Negative for abdominal pain.  Musculoskeletal: Negative for myalgias.  Skin: Negative for rash.  Neurological: Positive for sensory change, focal weakness and headaches.         Past Medical History:  Diagnosis Date  . Asthma    uses inhaler prn  . Diabetes mellitus without complication ([place])   . Sickle cell trait ([place])   . Unspecified high-risk pregnancy 01/27/2011         Past Surgical History:  Procedure Laterality Date  . IR ANGIO INTRA EXTRACRAN SEL COM CAROTID INNOMINATE BILAT MOD SED  02/13/2020  . IR ANGIO INTRA EXTRACRAN SEL INTERNAL CAROTID UNI L MOD SED  02/13/2020  . IR ANGIO INTRA EXTRACRAN SEL INTERNAL CAROTID UNI R MOD SED  02/15/2020  . IR ANGIO VERTEBRAL SEL VERTEBRAL BILAT MOD SED  02/13/2020  . IR CT HEAD LTD  02/13/2020  . IR CT HEAD LTD  02/15/2020  . IR PTA VENOUS EXCEPT DIALYSIS CIRCUIT  02/13/2020  . IR THROMBECT VENO MECH MOD SED  02/13/2020  . IR THROMBECT  VENO MECH REPT MOD SED  02/15/2020  . RADIOLOGY WITH ANESTHESIA N/A 02/13/2020   Procedure: IR WITH ANESTHESIA;  Surgeon: Luanne Bras, MD;  Location: [place];  Service: Radiology;  Laterality: N/A;  . RADIOLOGY WITH ANESTHESIA N/A 02/15/2020   Procedure: IR WITH ANESTHESIA;  Surgeon: Radiologist, Medication, MD;   Location: [place];  Service: Radiology;  Laterality: N/A;  . TONSILLECTOMY    . WISDOM TOOTH EXTRACTION           Family History  Problem Relation Age of Onset  . Sickle cell trait Mother   . Depression Mother   . Asthma Father   . Drug abuse Father        heroin abuse  . Kidney disease Father        due to heroin abuse  . Diabetes Maternal Grandmother     Social History:  Lives with mother and 2 young children. Was working in housekeeping at [employer]. Per reports that she has quit smoking. She has never used smokeless tobacco. She reports that she does not drink alcohol and does not use drugs.    Allergies: No Known Allergies No medications prior to admission.    Home: Home Living Family/patient expects to be discharged to:: Unsure Living Arrangements: Other relatives Additional Comments: per chart was "in between places" and staying with friends  Functional History: Prior Function Level of Independence: Independent Comments: assumed Functional Status:  Mobility: Bed Mobility Overal bed mobility: Needs Assistance Bed Mobility: Supine to Sit Supine to sit: Total assist,HOB elevated General bed mobility comments: total assist supine to chair position; head leans to right and repositioned to neutral with pillows and towels rolls Transfers Overall transfer level: Needs assistance Equipment used: 2 person hand held assist Transfers: Sit to/from [employer] Sit to Stand: Max assist,+2 physical assistance Stand pivot transfers: Max assist,+2 physical assistance General transfer comment: pt requires R knee block due to mild R knee buckling. transferring to left side as pt demonstrates some R neglect  ADL: ADL Overall ADL's : Needs assistance/impaired Eating/Feeding: NPO Grooming: Oral care,Maximal assistance,Sitting Grooming Details (indicate cue type and reason): able to sustain grasp with wash cloth over yonker tooth brush Upper Body  Bathing: Maximal assistance Lower Body Bathing: Total assistance Upper Body Dressing : Maximal assistance Lower Body Dressing: Total assistance Toilet Transfer: +2 for physical assistance,Maximal assistance General ADL Comments: pt with poor recall of information provided but when questiosn about long term memory making sounds and faces to indicate annoyance with questioning. Pt very strong response to PT cold hands and verbalized so several times. this was information she sustained after phsycial input.  Cognition: Cognition Overall Cognitive Status: Impaired/Different from baseline Arousal/Alertness:  (awake but "foggy") Orientation Level: Oriented to place,Disoriented to situation,Disoriented to time Attention: Sustained Sustained Attention: Impaired Sustained Attention Impairment:  (will continue to assess) Awareness: Impaired Awareness Impairment: Intellectual impairment Problem Solving: Impaired Problem Solving Impairment: Functional basic Behaviors:  (flat affect) Safety/Judgment: Impaired Cognition Arousal/Alertness: Awake/alert Behavior During Therapy: WFL for tasks assessed/performed Overall Cognitive Status: Impaired/Different from baseline Area of Impairment: Orientation,Attention,Memory,Following commands,Safety/judgement,Awareness,Problem solving Orientation Level: Disoriented to,Time,Situation Current Attention Level: Sustained Memory: Decreased short-term memory,Decreased recall of precautions Following Commands: Follows one step commands with increased time Safety/Judgement: Decreased awareness of safety,Decreased awareness of deficits Awareness: Intellectual Problem Solving: Slow processing,Decreased initiation,Difficulty sequencing,Requires verbal cues,Requires tactile cues General Comments: poor recall of information provided during session. pt appears shocked to learn that she has been in hospital 8 days. pt does state she  is in acute. Difficult to assess due  to: Level of arousal   Blood pressure 115/89, pulse 96, temperature 98.1 F (36.7 C), temperature source Oral, resp. rate 18, height 5\' 5"  (1.651 m), weight 66 kg, last menstrual period 02/03/2020, SpO2 100 %. Physical Exam  General: Alert, No apparent distress HEENT: Head is normocephalic, atraumatic, PERRLA, EOMI, sclera anicteric, oral mucosa pink and moist, dentition intact, ext ear canals clear,  Neck: Supple without JVD or lymphadenopathy Heart: Reg rate and rhythm. No murmurs rubs or gallops Chest: CTA bilaterally without wheezes, rales, or rhonchi; no distress Abdomen: Soft, non-tender, non-distended, bowel sounds positive. Extremities: No clubbing, cyanosis, or edema. Pulses are 2+ Psych: Pt's affect is appropriate. Pt is cooperative Skin: Clean and intact without signs of breakdown Neuro: Neurological:     Mental Status: She is alert and oriented to person, place, and time.     Comments: Right facial weakness without dysarthria. Slow to process but able to follow simple one step commands with delay. Able to scan to right without cues.  Right > left sided weakness.  4/5 strength with exception of 3/5 in RUE.     Lab Results Last 24 Hours       Results for orders placed or performed during the hospital encounter of 02/12/20 (from the past 24 hour(s))  Glucose, capillary     Status: None   Collection Time: 02/22/20 11:59 AM  Result Value Ref Range   Glucose-Capillary 97 70 - 99 mg/dL  Glucose, capillary     Status: Abnormal   Collection Time: 02/22/20  3:44 PM  Result Value Ref Range   Glucose-Capillary 131 (H) 70 - 99 mg/dL  Glucose, capillary     Status: Abnormal   Collection Time: 02/22/20  7:37 PM  Result Value Ref Range   Glucose-Capillary 135 (H) 70 - 99 mg/dL   Comment 1 Notify RN    Comment 2 Document in Chart   Glucose, capillary     Status: Abnormal   Collection Time: 02/23/20 12:39 AM  Result Value Ref Range   Glucose-Capillary 134 (H) 70 -  99 mg/dL   Comment 1 Notify RN    Comment 2 Document in Chart   Glucose, capillary     Status: Abnormal   Collection Time: 02/23/20  4:35 AM  Result Value Ref Range   Glucose-Capillary 103 (H) 70 - 99 mg/dL   Comment 1 Notify RN    Comment 2 Document in Chart   Heparin level (unfractionated)     Status: None   Collection Time: 02/23/20  4:50 AM  Result Value Ref Range   Heparin Unfractionated 0.45 0.30 - 0.70 IU/mL  CBC     Status: Abnormal   Collection Time: 02/23/20  4:50 AM  Result Value Ref Range   WBC 10.3 4.0 - 10.5 K/uL   RBC 3.21 (L) 3.87 - 5.11 MIL/uL   Hemoglobin 9.6 (L) 12.0 - 15.0 g/dL   HCT 29.5 (L) 36.0 - 46.0 %   MCV 91.9 80.0 - 100.0 fL   MCH 29.9 26.0 - 34.0 pg   MCHC 32.5 30.0 - 36.0 g/dL   RDW 15.0 11.5 - 15.5 %   Platelets 512 (H) 150 - 400 K/uL   nRBC 0.0 0.0 - 0.2 %  Basic metabolic panel     Status: Abnormal   Collection Time: 02/23/20  4:50 AM  Result Value Ref Range   Sodium 138 135 - 145 mmol/L   Potassium 3.3 (L) 3.5 -  5.1 mmol/L   Chloride 106 98 - 111 mmol/L   CO2 23 22 - 32 mmol/L   Glucose, Bld 116 (H) 70 - 99 mg/dL   BUN <5 (L) 6 - 20 mg/dL   Creatinine, Ser <0.30 (L) 0.44 - 1.00 mg/dL   Calcium 9.2 8.9 - 10.3 mg/dL   GFR, Estimated NOT CALCULATED >60 mL/min   Anion gap 9 5 - 15  Glucose, capillary     Status: Abnormal   Collection Time: 02/23/20  7:25 AM  Result Value Ref Range   Glucose-Capillary 110 (H) 70 - 99 mg/dL   Comment 1 Notify RN    Comment 2 Document in Chart      Imaging Results (Last 48 hours)  No results found.     Assessment/Plan: Diagnosis: Deep watershed white matter infarct/SAH 1. Does the need for close, 24 hr/day medical supervision in concert with the patient's rehab needs make it unreasonable for this patient to be served in a less intensive setting? Yes 2. Co-Morbidities requiring supervision/potential complications:  1. Asthma 2. sickle cell trait 3. Altered  mental status 4. Decreased arousal: continue amantadine 5. Cerebral edema: Na reviewed, has been trending downward.  3. Due to bladder management, bowel management, safety, skin/wound care, disease management, medication administration, pain management and patient education, does the patient require 24 hr/day rehab nursing? Yes 4. Does the patient require coordinated care of a physician, rehab nurse, therapy disciplines of PT, OT, SLP to address physical and functional deficits in the context of the above medical diagnosis(es)? Yes Addressing deficits in the following areas: balance, endurance, locomotion, strength, transferring, bowel/bladder control, bathing, dressing, feeding, grooming, toileting, cognition and psychosocial support 5. Can the patient actively participate in an intensive therapy program of at least 3 hrs of therapy per day at least 5 days per week? Yes 6. The potential for patient to make measurable gains while on inpatient rehab is excellent 7. Anticipated functional outcomes upon discharge from inpatient rehab are modified independent  with PT, supervision with OT, supervision with SLP. 8. Estimated rehab length of stay to reach the above functional goals is: 10-14 days 9. Anticipated discharge destination: Home 10. Overall Rehab/Functional Prognosis: excellent  RECOMMENDATIONS: This patient's condition is appropriate for continued rehabilitative care in the following setting: CIR Patient has agreed to participate in recommended program. Yes Note that insurance prior authorization may be required for reimbursement for recommended care.  Comment: Thank you for this consult. Admission coordinator to follow.   I have personally performed a face to face diagnostic evaluation, including, but not limited to relevant history and physical exam findings, of this patient and developed relevant assessment and plan.  Additionally, I have reviewed and concur with the physician  assistant's documentation above.  Leeroy Cha, MD  Bary Leriche, PA-C

## 2020-02-28 NOTE — H&P (Signed)
Physical Medicine and Rehabilitation Admission H&P    Chief Complaint  Patient presents with  . Stroke with functional deficits    HPI:  Jill Clarke. Jill Clarke is a 35 year old left handed female with history of T2DM, sickle cell trait who was admitted on 02/12/2020 with one week history of N/V, headaches and diarrhea. History taken from chart review and patient. She was seen in the ED on 02/10/2020-work-up was negative and she was discharged home.  She was readmitted on 02/12/2020 with nausea and MRI brain done revealing patchy acute infarcts in bilateral cerebral deep watershed white matter and along high left sylvian fissure with focal swelling and small volume SAH. 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 SAH felt to be in setting of significant thrombosis and she was started on hypertonic saline. She underwent cerebral angio with revascularization of occluded right and left transverse sinuses and partially of right sigmoid sinus with improvement in flow and decompression of venous system. She was noted to be somnolent with decreased movement on the right.  EEG was done, which showed moderate diffuse encephalopathy with delta slowing and bitemporal region. 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  Hospital course further complicated by hypercoagulability, panel showed low protein S and recommendations are to repeat in 6 to 8 weeks as this may be due to acute phase reaction.  Cerebral edema treated with hypertonic saline and phenylephrine added to help manage hypotension with SBP goal 120-160. Repeat MRV showed improved flow but occlusive clot still seen in superior sinus, distal right transverse and right sigmoid dural sinus with lack of recanalization of the vein of [place]. She underwent repeat cerebral angio 01/23  with  complete revascularization of SSS, straight sinus, vein of [place], ICD and right transverse sinus.  Follow-up EEG showed diffuse encephalopathy and Dr. [name] recommended continuing [place] for now. She was started on IV heparin and has been transitioned to Pradaxa on 01/31. She tolerated extubation without difficulty and diet has been advanced to regular diet. She was started on vancomycin and cefepime for mastoiditis on 02/15/2020-->narrowed to Unasyn. She continues to be limtied by HA, dizziness/lightheadedness, balance deficits with poor attention and tendency to bump into items on the right as well as difficulty completing ADL tasks. She has had decline in ability to carry out ADLs and as well as mobility therefore CIR recommended for progressive therapies.  Please see preadmission assessment from earlier today as well.   Review of Systems  Constitutional: Positive for malaise/fatigue.  Respiratory: Negative for shortness of breath.   Cardiovascular: Negative for chest pain.  Gastrointestinal: Negative for abdominal pain.  Musculoskeletal: Negative for myalgias.  Neurological: Positive for weakness. Negative for sensory change and speech change.  Psychiatric/Behavioral: Positive for memory loss.  All other systems reviewed and are negative.     Past Medical History:  Diagnosis Date  . Asthma    uses inhaler prn  . Diabetes mellitus without complication ([place])   . Sickle cell trait ([place])   . Unspecified high-risk pregnancy 01/27/2011    Past Surgical History:  Procedure Laterality Date  . IR ANGIO INTRA EXTRACRAN SEL COM CAROTID INNOMINATE UNI R MOD SED  02/13/2020  . IR ANGIO INTRA EXTRACRAN SEL COM CAROTID INNOMINATE UNI R MOD SED  02/15/2020  . IR ANGIO INTRA EXTRACRAN SEL INTERNAL CAROTID UNI L MOD SED  02/13/2020  . IR ANGIO VERTEBRAL SEL VERTEBRAL UNI R MOD SED  02/13/2020  . IR CT HEAD LTD  02/13/2020  . IR CT HEAD LTD  02/15/2020  . IR PTA VENOUS EXCEPT DIALYSIS CIRCUIT  02/15/2020  .  IR THROMBECT VENO MECH MOD SED  02/13/2020  . IR THROMBECT VENO MECH REPT MOD SED  02/15/2020  . RADIOLOGY WITH ANESTHESIA N/A 02/13/2020   Procedure: IR WITH ANESTHESIA;  Surgeon: Luanne Bras, MD;  Location: [place];  Service: Radiology;  Laterality: N/A;  . RADIOLOGY WITH ANESTHESIA N/A 02/15/2020   Procedure: IR WITH ANESTHESIA;  Surgeon: Radiologist, Medication, MD;  Location: [place];  Service: Radiology;  Laterality: N/A;  . TONSILLECTOMY    . WISDOM TOOTH EXTRACTION      Family History  Problem Relation Age of Onset  . Sickle cell trait Mother   . Depression Mother   . Asthma Father   . Drug abuse Father        heroin abuse  . Kidney disease Father        due to heroin abuse  . Diabetes Maternal Grandmother     Social History:  Lives with mother and 2 young children. 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 Per reports she has quit smoking. She has never used smokeless tobacco. She reports that she does not drink alcohol and does not use drugs.    Allergies: No Known Allergies    No medications prior to admission.    Drug Regimen Review  Drug regimen was reviewed and remains appropriate with no significant issues identified  Home: Home Living Family/patient expects to be discharged to:: Unsure Living Arrangements: Other relatives Additional Comments: per chart was "in between places" and staying with friends   Functional History: Prior Function Level of Independence: Independent Comments: assumed  Functional Status:  Mobility: Bed Mobility Overal bed mobility: Needs Assistance Bed Mobility: Supine to Sit Supine to sit: Supervision General bed mobility comments: pt received and left in recliner Transfers Overall transfer level: Needs assistance Equipment used: None Transfers: Sit to/from Stand Sit to Stand: Min guard Stand pivot transfers: Mod assist General transfer comment: Min Guard A for  safety Ambulation/Gait Ambulation/Gait assistance: Min Web designer (Feet): 80 Feet (80' x 2) Assistive device: None Gait Pattern/deviations: Step-through pattern General Gait Details: pt with step-through gait, does demonstrate increased lateral sway with dual task head turns. Pt also bumping into multiple objects on R side when distracted Gait velocity: reduced Gait velocity interpretation: <1.8 ft/sec, indicate of risk for recurrent falls Stairs: Yes Stairs assistance: Min guard Stair Management: One rail Left,Step to pattern Number of Stairs: 3 General stair comments: pt with posterior LOB immediately prior to starting stairs, then another posterior LOB after descending 2nd step    ADL: ADL Overall ADL's : Needs assistance/impaired Eating/Feeding: NPO Grooming: Wash/dry hands,Wash/dry face,Standing,Min guard Grooming Details (indicate cue type and reason): Cuing pt to perform hand hygiene at sink after toileting, pt getting to sink and then washing her face. Requiring Max cues to recall that she was asked to wash her hands. Upper Body Bathing: Supervision/ safety,Set up,Min guard,Standing Upper Body Bathing Details (indicate cue type and reason): min guard for dynamic standing tasks Lower Body Bathing: Min guard,Sit to/from stand Lower Body Bathing Details (indicate cue type and reason): minguard for dynamic LB bathing tasks with RUE supported on sink Upper Body Dressing : Minimal assistance,Sitting Upper Body Dressing Details (indicate cue type  and reason): to don new gown Lower Body Dressing: Min guard,Sit to/from stand Lower Body Dressing Details (indicate cue type and reason): Pt donning new underwear with Min Guard A for safety. Significant time for problem sovling and sequencing Toilet Transfer: Min Public house manager Details (indicate cue type and reason): MIN for balance and safety as pt attempting to sit prematurely Toileting- Marine scientist and Hygiene: Min guard,Sit to/from Sales promotion account executive Details (indicate cue type and reason): min guard for balance in standing Functional mobility during ADLs: Min guard General ADL Comments: Pt presenting with decreased cognition, balance, strength, and safety.  Cognition: Cognition Overall Cognitive Status: Impaired/Different from baseline Arousal/Alertness:  (awake but "foggy") Orientation Level: Oriented to person,Oriented to place,Oriented to situation,Disoriented to time Attention: Sustained Sustained Attention: Impaired Sustained Attention Impairment:  (will continue to assess) Awareness: Impaired Awareness Impairment: Intellectual impairment Problem Solving: Impaired Problem Solving Impairment: Functional basic Behaviors:  (flat affect) Safety/Judgment: Impaired Cognition Arousal/Alertness: Awake/alert Behavior During Therapy: WFL for tasks assessed/performed Overall Cognitive Status: Impaired/Different from baseline Area of Impairment: Attention,Memory,Following commands,Safety/judgement,Awareness,Problem solving Orientation Level: Disoriented to,Time (Using external clues for stating month and day of week, stated at [employer] but when asked if this place looks like that she said "no, i'm at [facility]") Current Attention Level: Sustained Memory: Decreased short-term memory Following Commands: Follows one step commands with increased time,Follows multi-step commands inconsistently Safety/Judgement: Decreased awareness of safety,Decreased awareness of deficits Awareness: Emergent Problem Solving: Difficulty sequencing General Comments: Upon arrival, pt attempting to get OOB to use bathroom (urinary urgency). Requiring cues to wait and maintain safety. Pt continues to present with poro safety, attention, and awareness. Pt demonstrate poor selective attention and easily distracted by external stimuli. Pt requiring cues for  safety and problem solving Difficult to assess due to: Level of arousal   Blood pressure 106/63, pulse 99, temperature 97.9 F (36.6 C), temperature source Axillary, resp. rate 16, height 5\' 5"  (1.651 m), weight 69.4 kg, last menstrual period 02/03/2020, SpO2 100 %. Physical Exam Vitals and nursing note reviewed.  Constitutional:      General: She is not in acute distress.    Appearance: She is normal weight.  HENT:     Head: Normocephalic and atraumatic.     Right Ear: External ear normal.     Left Ear: External ear normal.     Nose: Nose normal.  Eyes:     General:        Right eye: No discharge.        Left eye: No discharge.     Extraocular Movements: Extraocular movements intact.  Cardiovascular:     Rate and Rhythm: Regular rhythm. Tachycardia present.  Pulmonary:     Effort: Pulmonary effort is normal. No respiratory distress.     Breath sounds: No stridor.  Abdominal:     General: Abdomen is flat. Bowel sounds are normal. There is no distension.  Musculoskeletal:     Cervical back: Normal range of motion and neck supple.     Comments: No edema or tenderness in extremities  Skin:    General: Skin is warm and dry.  Neurological:     Mental Status: She is alert and easily aroused.     Comments: Alert and oriented self and place.  Unable to recall month or name 2nd month and needed cues to state age of her children.  Motor: RUE: 5/5 proximal to distal LUE: 5/5 proximal to distal RLE: HF 5/5, KE 4+/5,  ADF 4/5 LLE: HF: 4+/5, KE, ADF 4-/5 Sensation intact to light touch  Psychiatric:        Mood and Affect: Affect is blunt.        Behavior: Behavior is slowed.     Results for orders placed or performed during the hospital encounter of 02/12/20 (from the past 48 hour(s))  Glucose, capillary     Status: Abnormal   Collection Time: 02/25/20  8:14 PM  Result Value Ref Range   Glucose-Capillary 151 (H) 70 - 99 mg/dL    Comment: Glucose reference range applies only to  samples taken after fasting for at least 8 hours.  Glucose, capillary     Status: Abnormal   Collection Time: 02/25/20 11:39 PM  Result Value Ref Range   Glucose-Capillary 161 (H) 70 - 99 mg/dL    Comment: Glucose reference range applies only to samples taken after fasting for at least 8 hours.  Glucose, capillary     Status: Abnormal   Collection Time: 02/26/20  3:24 AM  Result Value Ref Range   Glucose-Capillary 116 (H) 70 - 99 mg/dL    Comment: Glucose reference range applies only to samples taken after fasting for at least 8 hours.   Comment 1 Notify RN    Comment 2 Document in Chart   Glucose, capillary     Status: Abnormal   Collection Time: 02/26/20  8:02 AM  Result Value Ref Range   Glucose-Capillary 105 (H) 70 - 99 mg/dL    Comment: Glucose reference range applies only to samples taken after fasting for at least 8 hours.  Basic metabolic panel     Status: Abnormal   Collection Time: 02/26/20  8:30 AM  Result Value Ref Range   Sodium 139 135 - 145 mmol/L   Potassium 3.5 3.5 - 5.1 mmol/L   Chloride 105 98 - 111 mmol/L   CO2 27 22 - 32 mmol/L   Glucose, Bld 106 (H) 70 - 99 mg/dL    Comment: Glucose reference range applies only to samples taken after fasting for at least 8 hours.   BUN <5 (L) 6 - 20 mg/dL   Creatinine, Ser 0.43 (L) 0.44 - 1.00 mg/dL   Calcium 8.9 8.9 - 10.3 mg/dL   GFR, Estimated >60 >60 mL/min    Comment: (NOTE) Calculated using the CKD-EPI Creatinine Equation (2021)    Anion gap 7 5 - 15    Comment: Performed at [facility] [address], [place], [place] [address]  CBC with Differential/Platelet     Status: Abnormal   Collection Time: 02/26/20  8:30 AM  Result Value Ref Range   WBC 6.5 4.0 - 10.5 K/uL   RBC 2.96 (L) 3.87 - 5.11 MIL/uL   Hemoglobin 8.6 (L) 12.0 - 15.0 g/dL   HCT 27.9 (L) 36.0 - 46.0 %   MCV 94.3 80.0 - 100.0 fL   MCH 29.1 26.0 - 34.0 pg   MCHC 30.8 30.0 - 36.0 g/dL   RDW 15.2 11.5 - 15.5 %   Platelets 465 (H) 150 - 400  K/uL   nRBC 0.0 0.0 - 0.2 %   Neutrophils Relative % 56 %   Neutro Abs 3.7 1.7 - 7.7 K/uL   Lymphocytes Relative 26 %   Lymphs Abs 1.7 0.7 - 4.0 K/uL   Monocytes Relative 12 %   Monocytes Absolute 0.8 0.1 - 1.0 K/uL   Eosinophils Relative 4 %   Eosinophils Absolute 0.3 0.0 - 0.5 K/uL  Basophils Relative 0 %   Basophils Absolute 0.0 0.0 - 0.1 K/uL   Immature Granulocytes 2 %   Abs Immature Granulocytes 0.15 (H) 0.00 - 0.07 K/uL    Comment: Performed at [facility], [place] [address], [place], [place] [address]  Glucose, capillary     Status: Abnormal   Collection Time: 02/26/20 11:51 AM  Result Value Ref Range   Glucose-Capillary 143 (H) 70 - 99 mg/dL    Comment: Glucose reference range applies only to samples taken after fasting for at least 8 hours.  Glucose, capillary     Status: Abnormal   Collection Time: 02/26/20  4:11 PM  Result Value Ref Range   Glucose-Capillary 152 (H) 70 - 99 mg/dL    Comment: Glucose reference range applies only to samples taken after fasting for at least 8 hours.  Glucose, capillary     Status: Abnormal   Collection Time: 02/26/20  8:40 PM  Result Value Ref Range   Glucose-Capillary 180 (H) 70 - 99 mg/dL    Comment: Glucose reference range applies only to samples taken after fasting for at least 8 hours.  Glucose, capillary     Status: Abnormal   Collection Time: 02/27/20  4:03 AM  Result Value Ref Range   Glucose-Capillary 127 (H) 70 - 99 mg/dL    Comment: Glucose reference range applies only to samples taken after fasting for at least 8 hours.  CBC     Status: Abnormal   Collection Time: 02/27/20  5:00 AM  Result Value Ref Range   WBC 7.4 4.0 - 10.5 K/uL   RBC 2.98 (L) 3.87 - 5.11 MIL/uL   Hemoglobin 8.8 (L) 12.0 - 15.0 g/dL   HCT 28.4 (L) 36.0 - 46.0 %   MCV 95.3 80.0 - 100.0 fL   MCH 29.5 26.0 - 34.0 pg   MCHC 31.0 30.0 - 36.0 g/dL   RDW 15.1 11.5 - 15.5 %   Platelets 504 (H) 150 - 400 K/uL   nRBC 0.0 0.0 - 0.2 %    Comment:  Performed at [facility], [place] [address], [place], [place] [address]  Glucose, capillary     Status: Abnormal   Collection Time: 02/27/20  7:52 AM  Result Value Ref Range   Glucose-Capillary 106 (H) 70 - 99 mg/dL    Comment: Glucose reference range applies only to samples taken after fasting for at least 8 hours.   Comment 1 Notify RN    Comment 2 Document in Chart   Glucose, capillary     Status: Abnormal   Collection Time: 02/27/20 11:50 AM  Result Value Ref Range   Glucose-Capillary 137 (H) 70 - 99 mg/dL    Comment: Glucose reference range applies only to samples taken after fasting for at least 8 hours.   Comment 1 Notify RN    Comment 2 Document in Chart   Glucose, capillary     Status: Abnormal   Collection Time: 02/27/20  4:01 PM  Result Value Ref Range   Glucose-Capillary 136 (H) 70 - 99 mg/dL    Comment: Glucose reference range applies only to samples taken after fasting for at least 8 hours.   No results found.     Medical Problem List and Plan: 1.  HA, dizziness/lightheadedness, balance deficits with poor attention,  decline in ability to carry out ADLs as well as mobility secondary to bilateral SAH, watershed infarcts, and venous thrombosis  -patient may shower  -ELOS/Goals: 9-14 days/supervision/mod  I  Admit to CIR 2.  Antithrombotics: -DVT/anticoagulation:  Pharmaceutical: Pradexa  -antiplatelet therapy: N/A 3. Headaches/Pain Management: Tylenol prn.  4. Mood: LCSW to follow for evaluation and support.   -antipsychotic agents: N/A 5. Neuropsych: This patient is not capable of making decisions on her own behalf. 6. Skin/Wound Care: Routine pressure relief measures.  7. Fluids/Electrolytes/Nutrition: Monitor I/O.   CMP ordered for Monday 8. 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   9. ABLA: Monitor CBC with serial checks--check CBC on Monday.   Transfused with 1 unit on 01/24  CBC ordered for Monday  10. Mastoiditis:  Complete course of Unasyn 11. Hypotension: SBP goal 120-160 range. Long term normotensive.   --Monitor orthostatic BP due to reports of dizziness 12.  Lethargy: On amantadine for attention/activation. Keppra decreased to 500 mg on 02/24/20  Bary Leriche, PA-C 02/27/2020  I have personally performed a face to face diagnostic evaluation, including, but not limited to relevant history and physical exam findings, of this patient and developed relevant assessment and plan.  Additionally, I have reviewed and concur with the physician assistant's documentation above.  Delice Lesch, MD, ABPMR  The patient's status has not changed. Any changes from the pre-admission screening or documentation from the acute chart are noted above.   Delice Lesch, MD, ABPMR

## 2020-02-28 NOTE — Progress Notes (Signed)
Show:Clear all '[x]' Manual'[x]' Template'[x]' Copied  Added by: '[x]' Raechel Ache, OT'[x]' Lind Covert, Lauren [name], CCC-SLP'[x]' Genella Mech, CCC-SLP   '[]' Hover for details  PMR Admission Coordinator Pre-Admission Assessment  Patient: Jill Clarke is an 35 y.o., female MRN: [id number] DOB: 1985/07/06 Height: '5\' 5"'  (165.1 cm) Weight: 69.4 kg                                                                                                                                                  Insurance Information HMO:     PPO:      PCP:      IPA:      80/20:      OTHER:  PRIMARY: [employer]      Policy#: [id number]      Subscriber: Pt. CM Name: via faxed approval      Phone#: via fax     Fax#: [phone] Pre-Cert#: [id number]      Employer:  Josem Kaufmann provided via fax from utilization department for admit to CIR. Pt is approved for 7 days with start date 02/24/20 and end date 03/01/20. Clinical updates to be faxed to [phone]  Benefits:  Phone #: [phone]     Name: NA Eff. Date: 08/24/2018 - 03/22/2020  Deductible: $0 OOP Max: $1,500 ($269.09 met) CIR: 80% coverage, 20% co insurance SNF: 80% coverage, 20% co-insurance: Limited to 60 days per cal. yr., Prior Auth required OP: 80% coverage, 20% co-insurance; 30 per calendar year combined therapies, Prior Auth required HH: 80% coverage, 20% co-insurance DME: 80% coverage, 20% co-insurance Prior Auth required  SECONDARY: none      Policy#:       Phone#:   The "Data Collection Information Summary" for patients in Inpatient Rehabilitation Facilities with attached "Privacy Act [place] Records" was provided and verbally reviewed with: N/A  Emergency Contact Information         Contact Information    Name Relation Home Work Mobile   Anthon Mother [phone]     Jelene, Albano   [phone]     Current Medical History  Patient Admitting Diagnosis: CVA History of Present Illness: HPI:  35 yo presented with AMS. MRI >left frontoparietal hemorrhagic venous infarct (no significant changed 02/15/2020.), hemorrhagic infarct within the right callosal, possible new punctate acute infarct within the left thalamocapsular junction, bilateral deep watershed white matter infarcts, persistent trace subarachnoid hemorrhage along the left frontoparietal convexity. Intubated 1/20-1/25. CXR Diffuse bilateral pulmonary infiltrates/edema. PMH: DM, sickle cell trait, asthma.  Complete NIHSS TOTAL: 2 Glasgow Coma Scale Score: 14  Past Medical History      Past Medical History:  Diagnosis Date  . Asthma    uses inhaler prn  . Diabetes mellitus without complication ([place])   . Sickle cell trait ([place])   . Unspecified high-risk pregnancy 01/27/2011    Family History  family history includes Asthma in her father; Depression in her mother; Diabetes in her maternal grandmother; Drug abuse in her father; Kidney disease in her father; Sickle cell trait in her mother.  Prior Rehab/Hospitalizations:  Has the patient had prior rehab or hospitalizations prior to admission? No  Has the patient had major surgery during 100 days prior to admission? Yes  Current Medications   Current Facility-Administered Medications:  .  acetaminophen (TYLENOL) tablet 650 mg, 650 mg, Oral, Q4H PRN, 650 mg at 02/27/20 1154 **OR** acetaminophen (TYLENOL) 160 MG/5ML solution 650 mg, 650 mg, Per Tube, Q4H PRN **OR** acetaminophen (TYLENOL) suppository 650 mg, 650 mg, Rectal, Q4H PRN, Deveshwar, Sanjeev, MD .  albuterol (PROVENTIL) (2.5 MG/3ML) 0.083% nebulizer solution 2.5 mg, 2.5 mg, Nebulization, Q4H PRN, Whiteheart, Kathryn A, NP .  amantadine (SYMMETREL) 50 MG/5ML solution 100 mg, 100 mg, Oral, BID, Rosalin Hawking, MD, 100 mg at 02/27/20 1009 .  Ampicillin-Sulbactam (UNASYN) 3 g in sodium chloride 0.9 % 100 mL IVPB, 3 g, Intravenous, Q6H, Garvin Fila, MD, Last Rate: 200 mL/hr at 02/27/20 1206, 3 g at  02/27/20 1206 .  atorvastatin (LIPITOR) tablet 40 mg, 40 mg, Oral, Daily, Dagar, Anjali, MD, 40 mg at 02/27/20 1009 .  bisacodyl (DULCOLAX) suppository 10 mg, 10 mg, Rectal, Daily PRN, Rigoberto Noel, MD, 10 mg at 02/18/20 1503 .  chlorhexidine (PERIDEX) 0.12 % solution 15 mL, 15 mL, Mouth Rinse, BID, Rosalin Hawking, MD, 15 mL at 02/27/20 1009 .  Chlorhexidine Gluconate Cloth 2 % PADS 6 each, 6 each, Topical, Daily, Greta Doom, MD, 6 each at 02/26/20 1110 .  dabigatran (PRADAXA) capsule 150 mg, 150 mg, Oral, Q12H, Garvin Fila, MD, 150 mg at 02/27/20 1008 .  docusate (COLACE) 50 MG/5ML liquid 100 mg, 100 mg, Oral, BID, Rosalin Hawking, MD, 100 mg at 02/27/20 1009 .  feeding supplement (ENSURE ENLIVE / ENSURE PLUS) liquid 237 mL, 237 mL, Oral, BID BM, Rosalin Hawking, MD, 237 mL at 02/27/20 1026 .  levETIRAcetam (KEPPRA) tablet 500 mg, 500 mg, Oral, BID, Garvin Fila, MD, 500 mg at 02/27/20 1009 .  LORazepam (ATIVAN) injection 2 mg, 2 mg, Intravenous, Q1H PRN, Kipp Brood, MD, 2 mg at 02/18/20 1020 .  MEDLINE mouth rinse, 15 mL, Mouth Rinse, q12n4p, Rosalin Hawking, MD, 15 mL at 02/25/20 1635 .  pantoprazole (PROTONIX) EC tablet 40 mg, 40 mg, Oral, Daily, Garvin Fila, MD, 40 mg at 02/27/20 1009 .  polyethylene glycol (MIRALAX / GLYCOLAX) packet 17 g, 17 g, Oral, Daily, Rosalin Hawking, MD, 17 g at 02/27/20 1009 .  Resource ThickenUp Clear, , Oral, PRN, Rosalin Hawking, MD .  sodium chloride flush (NS) 0.9 % injection 10-40 mL, 10-40 mL, Intracatheter, Q12H, Garvin Fila, MD, 10 mL at 02/27/20 1027 .  sodium chloride flush (NS) 0.9 % injection 10-40 mL, 10-40 mL, Intracatheter, PRN, Garvin Fila, MD  Patients Current Diet:     Diet Order                  Diet regular Room service appropriate? Yes; Fluid consistency: Thin  Diet effective now                  Precautions / Restrictions Precautions Precautions: Fall Precaution Comments: fall on 1/31 attempting to get OOB  alone Restrictions Weight Bearing Restrictions: No   Has the patient had 2 or more falls or a fall with injury in the past year?No  Prior Activity Level  Community (5-7x/wk): Pt. was actine in the commmunity PTA  Prior Functional Level Prior Function Level of Independence: Independent Comments: assumed  Self Care: Did the patient need help bathing, dressing, using the toilet or eating?  Independent  Indoor Mobility: Did the patient need assistance with walking from room to room (with or without device)? Independent  Stairs: Did the patient need assistance with internal or external stairs (with or without device)? Independent  Functional Cognition: Did the patient need help planning regular tasks such as shopping or remembering to take medications? Independent  Home Assistive Devices / Equipment Home Assistive Devices/Equipment: None  Prior Device Use: Indicate devices/aids used by the patient prior to current illness, exacerbation or injury? None of the above  Current Functional Level Cognition  Arousal/Alertness:  (awake but "foggy") Overall Cognitive Status: Impaired/Different from baseline Difficult to assess due to: Level of arousal Current Attention Level: Sustained Orientation Level: Oriented to person,Oriented to place,Oriented to situation,Disoriented to time Following Commands: Follows one step commands consistently,Follows multi-step commands inconsistently Safety/Judgement: Decreased awareness of deficits General Comments: pt continues to be impulsive attempting to get out of recliner with legs still elevated, pt very liable during session. pt starting to process all that she has been through and very grateful for abilty to be able to walk today with PT and wash her self with COTA. issued pt paper and pen as pt wants to try to process emotions through writing. encouraged pt to work on gratitude list Attention: Sustained Sustained Attention:  Impaired Sustained Attention Impairment:  (will continue to assess) Awareness: Impaired Awareness Impairment: Intellectual impairment Problem Solving: Impaired Problem Solving Impairment: Functional basic Behaviors:  (flat affect) Safety/Judgment: Impaired    Extremity Assessment (includes Sensation/Coordination)  Upper Extremity Assessment: RUE deficits/detail RUE Deficits / Details: Poor AROM. Weak grasp. Noting pt reaching to right when performing targeted reach at [place]. RUE Coordination: decreased fine motor,decreased gross motor LUE Deficits / Details: moving 4th and 5th digits on command. pt holding against gravity in painful stimuli of neck rotation  Lower Extremity Assessment: Defer to PT evaluation RLE Deficits / Details: no tone noted; PROM WNL LLE Deficits / Details: actively moved toes on command; PROM WFL    ADLs  Overall ADL's : Needs assistance/impaired Eating/Feeding: NPO Grooming: Wash/dry face,Sitting,Supervision/safety,Set up Grooming Details (indicate cue type and reason): from sitting in recliner in front of sink Upper Body Bathing: Supervision/ safety,Set up,Min guard,Standing Upper Body Bathing Details (indicate cue type and reason): min guard for dynamic standing tasks Lower Body Bathing: Min guard,Sit to/from stand Lower Body Bathing Details (indicate cue type and reason): minguard for dynamic LB bathing tasks with RUE supported on sink Upper Body Dressing : Minimal assistance,Sitting Upper Body Dressing Details (indicate cue type and reason): to don new gown Lower Body Dressing: Total assistance Toilet Transfer: Minimal assistance,Stand-pivot,BSC Toilet Transfer Details (indicate cue type and reason): MIN for balance and safety as pt attempting to sit prematurely Toileting- Clothing Manipulation and Hygiene: Supervision/safety,Set up,Min guard,Sit to/from stand Toileting - Clothing Manipulation Details (indicate cue type and reason): min guard for balance  in standing Functional mobility during ADLs: Minimal assistance,Cueing for safety General ADL Comments: pt continues to present with impaired balance, impaired RUE strength and FMC, decreased activity tolerance    Mobility  Overal bed mobility: Needs Assistance Bed Mobility: Supine to Sit Supine to sit: Supervision General bed mobility comments: pt OOB in recliner and returned to recliner at end of session    Transfers  Overall transfer level: Needs assistance  Equipment used: None Transfers: Sit to/from [employer] to Stand: Min assist Stand pivot transfers: Mod assist General transfer comment: MOD A for stand pivot for balance and cues for hand placement as pt impulsively attempting to sit prematurely. min A to stand initially from recliner however progressing to min guard from for bathing task    Ambulation / Gait / Stairs / Wheelchair Mobility  Ambulation/Gait Ambulation/Gait assistance: Herbalist (Feet): 100 Feet (100 x 5, separated by dual-task activities) Assistive device: None Gait Pattern/deviations: Step-through pattern General Gait Details: posterior lean in standing; able to turn head side to side and up and down while ambulating without LOB; unable to increase speed from baseline; minimal ability to slow speed from baseline; difficulty following multi-step commands; easily distracted by environment Gait velocity: reduced Gait velocity interpretation: <1.8 ft/sec, indicate of risk for recurrent falls Stairs: Yes Stairs assistance: Min guard Stair Management: One rail Left,Step to pattern Number of Stairs: 3 General stair comments: pt with posterior LOB immediately prior to starting stairs, then another posterior LOB after descending 2nd step    Posture / Balance Dynamic Sitting Balance Sitting balance - Comments: modA with cues, maxA intermittently due to impaired attention to balance Balance Overall balance assessment: Needs  assistance Sitting-balance support: No upper extremity supported,Feet supported Sitting balance-Leahy Scale: Good Sitting balance - Comments: modA with cues, maxA intermittently due to impaired attention to balance Postural control: Left lateral lean,Right lateral lean,Posterior lean Standing balance support: No upper extremity supported Standing balance-Leahy Scale: Poor Standing balance comment: at least one UE supported during bathing tasks at sink    Special needs/care consideration Skin: Dry skin at groin     Previous Home Environment (from acute therapy documentation) Living Arrangements: Other relatives Home Care Services: No Additional Comments: per chart was "in between places" and staying with friends  Discharge Living Setting Plans for Discharge Living Setting: House Type of Home at Discharge: House Discharge Home Layout: One level Discharge Home Access: Stairs to enter Entrance Stairs-Rails: Can reach both,Right,Left Entrance Stairs-Number of Steps: 4 Discharge Bathroom Shower/Tub: Tub/shower unit Discharge Bathroom Toilet: Handicapped height Discharge Bathroom Accessibility: Yes How Accessible: Accessible via walker Does the patient have any problems obtaining your medications?: Yes (Describe)  Social/Family/Support Systems Patient Roles: Parent Contact Information: [phone] Anticipated Caregiver: Alfredia Desanctis (mother) Anticipated Caregiver's Contact Information: [phone] Ability/Limitations of Caregiver: Can provide min-mod A Caregiver Availability: 24/7 Discharge Plan Discussed with Primary Caregiver: Yes Is Caregiver In Agreement with Plan?: Yes Does Caregiver/Family have Issues with Lodging/Transportation while Pt is in Rehab?: No   Goals Patient/Family Goal for Rehab: PT: Mod I; OT: Supervision;  SLP: Supervision Expected length of stay: 10-14 days Pt/Family Agrees to Admission and willing to participate: Yes Program Orientation  Provided & Reviewed with Pt/Caregiver Including Roles  & Responsibilities: Yes   Decrease burden of Care through IP rehab admission: NA  Possible need for SNF placement upon discharge: Not anticipated; pt has good social support at DC.    Patient Condition: This patient's medical and functional status has changed since the consult dated: 02/23/20 in which the Rehabilitation Physician determined and documented that the patient's condition is appropriate for intensive rehabilitative care in an inpatient rehabilitation facility. See "History of Present Illness" (above) for medical update. Functional changes are: Pt currently supervision with mobility, Min A with transfers, Min A 100' with gait and supervision-min A with upper body ADLs and Min G with lower body ADLs.. Patient's medical and functional status update has been discussed  with the Rehabilitation physician and patient remains appropriate for inpatient rehabilitation. Will admit to inpatient rehab today.  Preadmission Screen Completed By: Raechel Ache, OTR/L, with day of admission updates by Bethel Born, CCC-SLP, 02/27/2020 12:51 PM ______________________________________________________________________   Discussed status with Dr. Posey Pronto on 02/27/20  at 12:51 PM and received approval for admission today.  Admission Coordinator: Raechel Ache, with day of admission updates by Bethel Born, time 12:51 PM Sudie Grumbling 02/27/20

## 2020-02-29 DIAGNOSIS — I959 Hypotension, unspecified: Secondary | ICD-10-CM

## 2020-02-29 DIAGNOSIS — I6330 Cerebral infarction due to thrombosis of unspecified cerebral artery: Secondary | ICD-10-CM

## 2020-02-29 NOTE — Evaluation (Signed)
Speech Language Pathology Assessment and Plan  Patient Details  Name: Jill Clarke MRN: [id number] Date of Birth: 01-23-1986  SLP Diagnosis: Cognitive Impairments  Rehab Potential: Good ELOS: 7-10 days    Today's Date: 02/29/2020 SLP Individual Time: 0805-0900 SLP Individual Time Calculation (min): 55 min   Hospital Problem: Principal Problem:   Cerebral thrombosis with cerebral infarction Active Problems:   Cerebral vein thrombosis   Hypotension  Past Medical History:  Past Medical History:  Diagnosis Date  . Asthma    uses inhaler prn  . Diabetes mellitus without complication ([place])   . Sickle cell trait ([place])   . Unspecified high-risk pregnancy 01/27/2011   Past Surgical History:  Past Surgical History:  Procedure Laterality Date  . IR ANGIO INTRA EXTRACRAN SEL COM CAROTID INNOMINATE UNI R MOD SED  02/13/2020  . IR ANGIO INTRA EXTRACRAN SEL COM CAROTID INNOMINATE UNI R MOD SED  02/15/2020  . IR ANGIO INTRA EXTRACRAN SEL INTERNAL CAROTID UNI L MOD SED  02/13/2020  . IR ANGIO VERTEBRAL SEL VERTEBRAL UNI R MOD SED  02/13/2020  . IR CT HEAD LTD  02/13/2020  . IR CT HEAD LTD  02/15/2020  . IR PTA VENOUS EXCEPT DIALYSIS CIRCUIT  02/15/2020  . IR THROMBECT VENO MECH MOD SED  02/13/2020  . IR THROMBECT VENO MECH REPT MOD SED  02/15/2020  . RADIOLOGY WITH ANESTHESIA N/A 02/13/2020   Procedure: IR WITH ANESTHESIA;  Surgeon: Luanne Bras, MD;  Location: [facility];  Service: Radiology;  Laterality: N/A;  . RADIOLOGY WITH ANESTHESIA N/A 02/15/2020   Procedure: IR WITH ANESTHESIA;  Surgeon: Radiologist, Medication, MD;  Location: [facility];  Service: Radiology;  Laterality: N/A;  . TONSILLECTOMY    . WISDOM TOOTH EXTRACTION      Assessment / Plan / Recommendation Clinical Impression   Jill Clarke is a 35 year old left handed female with history of T2DM, sickle cell trait who was admitted on 02/12/2020 with one week history of N/V, headaches and diarrhea. History taken from chart  review and patient. She was seen in the ED on 02/10/2020-work-up was negative and she was discharged home.  She was readmitted on 02/12/2020 with nausea and MRI brain done revealing patchy acute infarcts in bilateral cerebral deep watershed white matter and along high left sylvian fissure with focal swelling and small volume SAH. 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 SAH felt to be in setting of significant thrombosis and she was started on hypertonic saline. She underwent cerebral angio with revascularization of occluded right and left transverse sinuses and partially of right sigmoid sinus with improvement in flow and decompression of venous system. She was noted to be somnolent with decreased movement on the right.  EEG was done, which showed moderate diffuse encephalopathy with delta slowing and bitemporal region. 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  Hospital course further complicated by hypercoagulability, panel showed low protein S and recommendations are to repeat in 6 to 8 weeks as this may be due to acute phase reaction.  Cerebral edema treated with hypertonic saline and phenylephrine added to help manage hypotension with SBP goal 120-160. Repeat MRV showed improved flow but occlusive clot still seen in superior sinus, distal right transverse and right sigmoid dural sinus with lack of recanalization of the vein of [place]. She underwent repeat cerebral angio 01/23  with  complete revascularization of SSS, straight sinus, vein of [place], ICD and right transverse sinus.  Follow-up EEG showed diffuse encephalopathy and Dr. [name] recommended continuing [place] for now. She was started on IV heparin and has been transitioned to Pradaxa on 01/31. She tolerated extubation without difficulty and diet has been advanced to regular diet.  She was started on vancomycin and cefepime for mastoiditis on 02/15/2020-->narrowed to Unasyn. She continues to be limtied by HA, dizziness/lightheadedness, balance deficits with poor attention and tendency to bump into items on the right as well as difficulty completing ADL tasks. She has had decline in ability to carry out ADLs and as well as mobility therefore CIR recommended for progressive therapies.  Please see preadmission assessment from earlier today as well.  SLP evaluation completed on 02/29/2020 with results as follows:  Bedside swallow evaluation: Pt presents with excellent toleration of current diet of regular textures and thin liquids.  She has no overt s/s of aspiration with solids or liquids and she was able to clear solids from the oral cavity without difficulty or residue post swallow.  Pt may need intermittent supervision for use of universal swallowing precautions due to deficits outlined in cognitive-linguistic evaluation below, but no further ST needs identified for dysphagia at this time.    Cognitive-linguistic evaluation: Pt presents with moderate cognitive deficits, most specifically related to impairments in memory, attention, problem solving, and awareness of deficits.  These results were verified by scores on the Cognistat as follows:  Calculations- 2, memory- 7, visuspatial-constructional- 1, judgment- 3.  Pt reports good insight into her current memory deficits but struggles to recognize and correct errors in the moment.    Given the deficits mentioned above, pt would benefit from skilled ST while inpatient in order to maximize functional independence and reduce burden of care prior to discharge.  Anticipate that pt may need 24/7 supervision at discharge in addition to [place] follow up at next level of care.     Skilled Therapeutic Interventions          Cognitive-linguistic and bedside swallow evaluation completed with results and recommendations reviewed with patient.     SLP  Assessment  Patient will need skilled Speech Lanaguage Pathology Services during CIR admission    Recommendations  SLP Diet Recommendations: Age appropriate regular solids;Thin Liquid Administration via: Cup;Straw Medication Administration: Whole meds with liquid Supervision: Patient able to self feed;Intermittent supervision to cue for compensatory strategies Compensations: Minimize environmental distractions;Slow rate;Small sips/bites Postural Changes and/or Swallow Maneuvers: Seated upright 90 degrees;Upright 30-60 min after meal Oral Care Recommendations: Oral care BID Recommendations for Other Services: Neuropsych consult Patient destination: Home Follow up Recommendations: 24 hour supervision/assistance;Home Health SLP;Outpatient SLP Equipment Recommended: None recommended by SLP    SLP Frequency 3 to 5 out of 7 days   SLP Duration  SLP Intensity  SLP Treatment/Interventions 7-10 days  Minumum of 1-2 x/day, 30 to 90 minutes  Cognitive remediation/compensation;Cueing hierarchy;Patient/family education;Functional tasks;Internal/external aids    Pain Pain Assessment Pain Scale: 0-10 Pain Score: 0-No pain Pain Type: Acute pain Pain Location: Head Pain Orientation: Left Pain Descriptors / Indicators: Aching Pain Onset: On-going Pain Intervention(s): Shower  Prior Functioning Cognitive/Linguistic Baseline: Within functional limits Type of Home: House  Lives With: Family Available Help at Discharge: Family Vocation: Full time employment  SLP Evaluation Cognition Overall Cognitive Status: Impaired/Different from baseline Arousal/Alertness: Awake/alert Orientation Level: Oriented X4 Attention: Selective Sustained Attention: Impaired Sustained Attention Impairment: Verbal basic;Functional basic Selective Attention: Impaired Selective Attention Impairment: Functional basic;Verbal basic  Memory: Impaired Memory Impairment: Decreased recall of new information;Retrieval  deficit Decreased Long Term Memory: Verbal basic;Functional complex Decreased Short Term Memory: Verbal basic;Functional complex Immediate Memory Recall: Sock;Bed;Blue Memory Recall Sock: Not able to recall Memory Recall Blue: Not able to recall Memory Recall Bed: Not able to recall Awareness: Impaired Awareness Impairment: Emergent impairment Problem Solving: Impaired Problem Solving Impairment: Functional basic Executive Function: Self Monitoring;Self Correcting Self Monitoring: Impaired Self Monitoring Impairment: Functional basic Self Correcting: Impaired Self Correcting Impairment: Functional basic Safety/Judgment: Impaired  Comprehension Auditory Comprehension Overall Auditory Comprehension: Appears within functional limits for tasks assessed Expression Expression Primary Mode of Expression: Verbal Verbal Expression Overall Verbal Expression: Appears within functional limits for tasks assessed Written Expression Dominant Hand: Left Oral Motor Oral Motor/Sensory Function Overall Oral Motor/Sensory Function: Within functional limits Motor Speech Overall Motor Speech: Appears within functional limits for tasks assessed  Care Tool Care Tool Cognition Expression of Ideas and Wants Expression of Ideas and Wants: Without difficulty (complex and basic) - expresses complex messages without difficulty and with speech that is clear and easy to understand   Understanding Verbal and Non-Verbal Content Understanding Verbal and Non-Verbal Content: Understands (complex and basic) - clear comprehension without cues or repetitions   Memory/Recall Ability *first 3 days only Memory/Recall Ability *first 3 days only: Current season;That he or she is in a hospital/hospital unit    Bedside Swallowing Assessment General Previous Swallow Assessment: 02/19/20 Diet Prior to this Study: Regular;Thin liquids Temperature Spikes Noted: No Respiratory Status: Room air History of Recent  Intubation: Yes Length of Intubations (days): 5 days Date extubated: 02/17/20 Behavior/Cognition: Alert;Cooperative;Pleasant mood Oral Cavity - Dentition: Adequate natural dentition Self-Feeding Abilities: Able to feed self Vision: Functional for self-feeding Patient Positioning: Upright in bed Baseline Vocal Quality: Normal Volitional Cough: Strong Volitional Swallow: Able to elicit  Oral Care Assessment   Ice Chips   Thin Liquid Thin Liquid: Within functional limits Nectar Thick   Honey Thick   Puree Puree: Within functional limits Solid Solid: Within functional limits BSE Assessment Risk for Aspiration Impact on safety and function: Mild aspiration risk Other Related Risk Factors: Deconditioning;Prolonged intubation;Cognitive impairment  Short Term Goals: Week 1: SLP Short Term Goal 1 (Week 1): STG=LTG due to ELOS  Refer to Care Plan for Long Term Goals  Recommendations for other services: Neuropsych  Discharge Criteria: Patient will be discharged from SLP if patient refuses treatment 3 consecutive times without medical reason, if treatment goals not met, if there is a change in medical status, if patient makes no progress towards goals or if patient is discharged from hospital.  The above assessment, treatment plan, treatment alternatives and goals were discussed and mutually agreed upon: by patient  Emilio Math 02/29/2020, 12:46 PM

## 2020-02-29 NOTE — Evaluation (Signed)
Occupational Therapy Assessment and Plan  Patient Details  Name: Jill Clarke MRN: [id number] Date of Birth: September 08, 1985  OT Diagnosis: altered mental status, cognitive deficits and hemiplegia affecting non-dominant side Rehab Potential: Rehab Potential (ACUTE ONLY): Good ELOS: 7-10 weeks   Today's Date: 02/29/2020 OT Individual Time: 0930-1030 OT Individual Time Calculation (min): 60 min     Hospital Problem: Principal Problem:   Cerebral thrombosis with cerebral infarction Active Problems:   Cerebral vein thrombosis   Hypotension   Past Medical History:  Past Medical History:  Diagnosis Date  . Asthma    uses inhaler prn  . Diabetes mellitus without complication ([place])   . Sickle cell trait ([place])   . Unspecified high-risk pregnancy 01/27/2011   Past Surgical History:  Past Surgical History:  Procedure Laterality Date  . IR ANGIO INTRA EXTRACRAN SEL COM CAROTID INNOMINATE UNI R MOD SED  02/13/2020  . IR ANGIO INTRA EXTRACRAN SEL COM CAROTID INNOMINATE UNI R MOD SED  02/15/2020  . IR ANGIO INTRA EXTRACRAN SEL INTERNAL CAROTID UNI L MOD SED  02/13/2020  . IR ANGIO VERTEBRAL SEL VERTEBRAL UNI R MOD SED  02/13/2020  . IR CT HEAD LTD  02/13/2020  . IR CT HEAD LTD  02/15/2020  . IR PTA VENOUS EXCEPT DIALYSIS CIRCUIT  02/15/2020  . IR THROMBECT VENO MECH MOD SED  02/13/2020  . IR THROMBECT VENO MECH REPT MOD SED  02/15/2020  . RADIOLOGY WITH ANESTHESIA N/A 02/13/2020   Procedure: IR WITH ANESTHESIA;  Surgeon: Luanne Bras, MD;  Location: [facility];  Service: Radiology;  Laterality: N/A;  . RADIOLOGY WITH ANESTHESIA N/A 02/15/2020   Procedure: IR WITH ANESTHESIA;  Surgeon: Radiologist, Medication, MD;  Location: [facility];  Service: Radiology;  Laterality: N/A;  . TONSILLECTOMY    . WISDOM TOOTH EXTRACTION      Assessment & Plan Clinical Impression: Jill Clarke is a 35 year old left handed female with history of T2DM, sickle cell trait who was admitted on 02/12/2020 with one  week history of N/V, headaches and diarrhea. History taken from chart review and patient. She was seen in the ED on 02/10/2020-work-up was negative and she was discharged home.  She was readmitted on 02/12/2020 with nausea and MRI brain done revealing patchy acute infarcts in bilateral cerebral deep watershed white matter and along high left sylvian fissure with focal swelling and small volume SAH. 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 SAH felt to be in setting of significant thrombosis and she was started on hypertonic saline. She underwent cerebral angio with revascularization of occluded right and left transverse sinuses and partially of right sigmoid sinus with improvement in flow and decompression of venous system. She was noted to be somnolent with decreased movement on the right.  EEG was done, which showed moderate diffuse encephalopathy with delta slowing and bitemporal region. 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  Hospital course further complicated by hypercoagulability, panel showed low protein S and recommendations are to repeat in 6 to 8 weeks as this may be due to acute phase reaction.  Cerebral edema treated with hypertonic saline and phenylephrine added to help manage hypotension with SBP goal 120-160. Repeat MRV showed improved flow but occlusive clot still seen in superior sinus, distal right transverse and right sigmoid dural sinus with lack of recanalization of the vein of [place].  She underwent repeat cerebral angio 01/23  with complete revascularization of SSS, straight sinus, vein of [place], ICD and right transverse sinus.  Follow-up EEG showed diffuse encephalopathy and Dr. [name] recommended continuing [place] for now. She was started on IV heparin and has been transitioned to Pradaxa on 01/31. She tolerated  extubation without difficulty and diet has been advanced to regular diet. She was started on vancomycin and cefepime for mastoiditis on 02/15/2020-->narrowed to Unasyn. She continues to be limtied by HA, dizziness/lightheadedness, balance deficits with poor attention and tendency to bump into items on the right as well as difficulty completing ADL tasks. She has had decline in ability to carry out ADLs and as well as mobility therefore CIR recommended for progressive therapies.  Please see preadmission assessment from earlier today as well.  Patient transferred to CIR on 02/28/2020 .    Patient currently requires min with basic self-care skills secondary to muscle weakness, decreased attention to right, decreased attention, decreased awareness, decreased problem solving, decreased safety awareness, decreased memory and delayed processing and decreased sitting balance, decreased standing balance, hemiplegia and decreased balance strategies.  Prior to hospitalization, patient could complete ADLs with independent .  Patient will benefit from skilled intervention to decrease level of assist with basic self-care skills and increase level of independence with iADL prior to discharge home with care partner.  Anticipate patient will require 24 hour supervision and follow up outpatient.  OT - End of Session Activity Tolerance: Tolerates 10 - 20 min activity with multiple rests Endurance Deficit: Yes Endurance Deficit Description: generalized weakness OT Assessment Rehab Potential (ACUTE ONLY): Good OT Patient demonstrates impairments in the following area(s): Balance;Cognition;Endurance;Motor;Safety;Behavior;Perception OT Basic ADL's Functional Problem(s): Bathing;Dressing;Toileting OT Advanced ADL's Functional Problem(s): Simple Meal Preparation OT Transfers Functional Problem(s): Toilet;Tub/Shower OT Additional Impairment(s): None OT Plan OT Intensity: Minimum of 1-2 x/day, 45 to 90 minutes OT Frequency: 5  out of 7 days OT Duration/Estimated Length of Stay: 7-10 weeks OT Treatment/Interventions: Balance/vestibular training;Discharge planning;Pain management;Self Care/advanced ADL retraining;Therapeutic Activities;UE/LE Coordination activities;Cognitive remediation/compensation;Functional mobility training;Patient/family education;Therapeutic Exercise;DME/adaptive equipment instruction;Psychosocial support;UE/LE Strength taining/ROM OT Self Feeding Anticipated Outcome(s): no goal set OT Basic Self-Care Anticipated Outcome(s): mod I OT Toileting Anticipated Outcome(s): mod I OT Bathroom Transfers Anticipated Outcome(s): supervision OT Recommendation Recommendations for Other Services: Neuropsych consult;Therapeutic Recreation consult Therapeutic Recreation Interventions: [place] group Patient destination: Home Follow Up Recommendations: Outpatient OT Equipment Recommended: To be determined   OT Evaluation Precautions/Restrictions  Precautions Precautions: Fall Precaution Comments: fall, reduced awareness Restrictions Weight Bearing Restrictions: No General Chart Reviewed: Yes Family/Caregiver Present: No   Pain Pain Assessment Pain Scale: 0-10 Pain Score: 7  Pain Type: Acute pain Pain Location: Head Pain Orientation: Left Pain Descriptors / Indicators: Aching Pain Onset: On-going Pain Intervention(s): Shower Home Living/Prior Functioning Home Living Family/patient expects to be discharged to:: Private residence Living Arrangements: Parent Available Help at Discharge: Family Type of Home: House Home Layout: One level  Lives With: Family (mother, kids) IADL History Homemaking Responsibilities: Yes Meal Prep Responsibility: Primary Laundry Responsibility: Primary Cleaning Responsibility: Primary Bill Paying/Finance Responsibility: Primary Shopping Responsibility: Primary Child Care Responsibility: Primary Current License: Yes Mode of Transportation: Car Occupation: Full  time employment Prior Function Level of Independence: Independent with basic ADLs,Independent with transfers,Independent with gait  Able to Take Stairs?: Yes Driving: Yes Vocation: Full time employment Vocation Requirements: Worked at [employer] in [place]- lifting and carrying Comments: 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 Vision Baseline Vision/History: No visual deficits Patient Visual Report: No change from baseline Vision Assessment?:  No apparent visual deficits Perception  Perception: Impaired Inattention/Neglect: Does not attend to right visual field Praxis Praxis: Intact Cognition Overall Cognitive Status: Impaired/Different from baseline Arousal/Alertness: Awake/alert Orientation Level: Person;Place;Situation Person: Oriented Place: Oriented Situation: Oriented Year: 2022 Month: February Day of Week: Correct Memory: Impaired Memory Impairment: Storage deficit;Decreased short term memory;Decreased recall of new information Decreased Long Term Memory: Verbal basic;Functional complex Decreased Short Term Memory: Verbal basic;Functional complex Immediate Memory Recall: Sock;Bed;Blue Memory Recall Sock: Not able to recall Memory Recall Blue: Not able to recall Memory Recall Bed: Not able to recall Attention: Sustained Sustained Attention: Impaired Sustained Attention Impairment: Verbal basic;Functional basic Awareness: Impaired Awareness Impairment: Intellectual impairment Problem Solving: Impaired Problem Solving Impairment: Functional basic Safety/Judgment: Impaired Sensation Sensation Light Touch: Appears Intact Hot/Cold: Appears Intact Proprioception: Appears Intact Coordination Gross Motor Movements are Fluid and Coordinated: No Fine Motor Movements are Fluid and Coordinated: No Coordination and Movement Description: inattention, generalized weakness Finger Nose Finger Test: WFL Motor  Motor Motor: Hemiplegia Motor - Skilled Clinical Observations: very mild R hemi   Trunk/Postural Assessment  Cervical Assessment Cervical Assessment: Within Functional Limits Thoracic Assessment Thoracic Assessment: Within Functional Limits Lumbar Assessment Lumbar Assessment: Within Functional Limits Postural Control Postural Control: Deficits on evaluation Righting Reactions: delayed  Balance Balance Balance Assessed: Yes Standardized Balance Assessment Standardized Balance Assessment: (P) Berg Balance Test Berg Balance Test Sit to Stand: (P) Able to stand without using hands and stabilize independently Standing Unsupported: (P) Able to stand safely 2 minutes Sitting with Back Unsupported but Feet Supported on Floor or Stool: (P) Able to sit safely and securely 2 minutes Stand to Sit: (P) Sits safely with minimal use of hands Transfers: (P) Able to transfer safely, minor use of hands Standing Unsupported with Eyes Closed: (P) Able to stand 10 seconds safely Standing Ubsupported with Feet Together: (P) Able to place feet together independently and stand 1 minute safely From Standing, Reach Forward with Outstretched Arm: (P) Can reach forward >12 cm safely (5") From Standing Position, Pick up Object from Floor: (P) Able to pick up shoe safely and easily From Standing Position, Turn to Look Behind Over each Shoulder: (P) Looks behind one side only/other side shows less weight shift Turn 360 Degrees: (P) Able to turn 360 degrees safely but slowly Standing Unsupported, Alternately Place Feet on Step/Stool: (P) Able to stand independently and safely and complete 8 steps in 20 seconds Standing Unsupported, One Foot in Front: (P) Able to place foot tandem independently and hold 30 seconds Standing on One Leg: (P) Able to lift leg independently and hold > 10 seconds Total Score: (P) 52 Static Sitting Balance Static Sitting - Balance Support: Feet supported Static Sitting - Level of Assistance: 5: Stand by assistance Dynamic Sitting Balance Dynamic Sitting - Balance  Support: Feet supported Dynamic Sitting - Level of Assistance: 5: Stand by assistance Static Standing Balance Static Standing - Balance Support: During functional activity Static Standing - Level of Assistance: 4: Min assist Dynamic Standing Balance Dynamic Standing - Balance Support: During functional activity Dynamic Standing - Level of Assistance: 4: Min assist Extremity/Trunk Assessment RUE Assessment RUE Assessment: Exceptions to [facility] General Strength Comments: AROM WFL, strength 4-/5 LUE Assessment LUE Assessment: Within Functional Limits  Care Tool Care Tool Self Care Eating   Eating Assist Level: Independent with assistive device    Oral Care    Oral Care Assist Level: Supervision/Verbal cueing    Bathing   Body parts bathed by patient: Right arm;Left upper leg;Left arm;Right lower  leg;Chest;Abdomen;Left lower leg;Face;Front perineal area;Buttocks;Right upper leg     Assist Level: Contact Guard/Touching assist    Upper Body Dressing(including orthotics)   What is the patient wearing?: Pull over shirt;Bra   Assist Level: Moderate Assistance - Patient 50 - 74%    Lower Body Dressing (excluding footwear)   What is the patient wearing?: Underwear/pull up;Pants Assist for lower body dressing: Contact Guard/Touching assist    Putting on/Taking off footwear   What is the patient wearing?: Non-skid slipper socks Assist for footwear: Supervision/Verbal cueing       Care Tool Toileting Toileting activity   Assist for toileting: Contact Guard/Touching assist     Care Tool Bed Mobility Roll left and right activity   Roll left and right assist level: Independent    Sit to lying activity   Sit to lying assist level: Independent    Lying to sitting edge of bed activity   Lying to sitting edge of bed assist level: Independent     Care Tool Transfers Sit to stand transfer   Sit to stand assist level: Contact Guard/Touching assist    Chair/bed transfer   Chair/bed  transfer assist level: Contact Guard/Touching assist     Toilet transfer   Assist Level: Contact Guard/Touching assist     Care Tool Cognition Expression of Ideas and Wants Expression of Ideas and Wants: Some difficulty - exhibits some difficulty with expressing needs and ideas (e.g, some words or finishing thoughts) or speech is not clear   Understanding Verbal and Non-Verbal Content Understanding Verbal and Non-Verbal Content: Understands (complex and basic) - clear comprehension without cues or repetitions   Memory/Recall Ability *first 3 days only Memory/Recall Ability *first 3 days only: Location of own room;Staff names and faces;That he or she is in a hospital/hospital unit    Refer to Care Plan for [place] 1 OT Short Term Goal 1 (Week 1): Pt will don bra with supervision OT Short Term Goal 2 (Week 1): Pt will maintain selective attention during ADLs with min cueing OT Short Term Goal 3 (Week 1): Pt will complete toileting tasks with supervision  Recommendations for other services: Neuropsych and Therapeutic Recreation  Kitchen group   Skilled Therapeutic Intervention ADL ADL Eating: Modified independent Where Assessed-Eating: Edge of bed Grooming: Supervision/safety Where Assessed-Grooming: Edge of bed Upper Body Bathing: Supervision/safety Where Assessed-Upper Body Bathing: Shower Lower Body Bathing: Contact guard Where Assessed-Lower Body Bathing: Shower Where Assessed-Toileting: Glass blower/designer: Psychiatric nurse Method: Magazine features editor: Environmental education officer Method: Heritage manager: Shower seat with back Mobility  Bed Mobility Bed Mobility: Sit to Supine;Supine to Sit Supine to Sit: Supervision/Verbal cueing Sit to Supine: Supervision/Verbal cueing Transfers Sit to Stand: Contact Guard/Touching assist Stand to Sit: Contact Guard/Touching  assist   Skilled OT evaluation completed as described above. Pt with good awareness into memory deficits but requiring cueing for awareness of R inattention and balance. Pt slightly perseverative on bathing, unclear if this was just d/t it being her first shower. Pt with min cueing required for safety awareness. Poor proprioceptive awareness of the RUE and attention. Pt left supine with bed alarm set. Edu provided re ELOS, OT POC, and goals.   Discharge Criteria: Patient will be discharged from OT if patient refuses treatment 3 consecutive times without medical reason, if treatment goals not met, if there is a change in medical status, if patient makes no progress towards goals or if  patient is discharged from hospital.  The above assessment, treatment plan, treatment alternatives and goals were discussed and mutually agreed upon: by patient  Curtis Sites 02/29/2020, 12:30 PM

## 2020-02-29 NOTE — Plan of Care (Signed)
  Problem: Consults Goal: RH STROKE PATIENT EDUCATION Description: See Patient Education module for education specifics  Outcome: Progressing   Problem: RH SAFETY Goal: RH STG ADHERE TO SAFETY PRECAUTIONS W/ASSISTANCE/DEVICE Description: STG Adhere to Safety Precautions With Mod I Assistance/Device. Outcome: Progressing Goal: RH STG DECREASED RISK OF FALL WITH ASSISTANCE Description: STG Decreased Risk of Fall With Mod I Assistance. Outcome: Progressing   Problem: RH COGNITION-NURSING Goal: RH STG USES MEMORY AIDS/STRATEGIES W/ASSIST TO PROBLEM SOLVE Description: STG Uses Memory Aids/Strategies With Mod I Assistance to Problem Solve. Outcome: Progressing   Problem: RH KNOWLEDGE DEFICIT Goal: RH STG INCREASE KNOWLEDGE OF HYPERTENSION Description: Mod I  Outcome: Progressing   

## 2020-02-29 NOTE — Evaluation (Addendum)
Physical Therapy Assessment and Plan  Patient Details  Name: Jill Clarke MRN: [id number] Date of Birth: January 12, 1986  PT Diagnosis: Abnormality of gait, Difficulty walking, Hemiparesis dominant, Impaired cognition and Muscle weakness Rehab Potential: Good ELOS: 7-10 days   Today's Date: 02/29/2020 PT Individual Time:  1100-1208    PT minutes: 68  Hospital Problem: Principal Problem:   Cerebral thrombosis with cerebral infarction Active Problems:   Cerebral vein thrombosis   Hypotension   Past Medical History:  Past Medical History:  Diagnosis Date  . Asthma    uses inhaler prn  . Diabetes mellitus without complication ([place])   . Sickle cell trait ([place])   . Unspecified high-risk pregnancy 01/27/2011   Past Surgical History:  Past Surgical History:  Procedure Laterality Date  . IR ANGIO INTRA EXTRACRAN SEL COM CAROTID INNOMINATE UNI R MOD SED  02/13/2020  . IR ANGIO INTRA EXTRACRAN SEL COM CAROTID INNOMINATE UNI R MOD SED  02/15/2020  . IR ANGIO INTRA EXTRACRAN SEL INTERNAL CAROTID UNI L MOD SED  02/13/2020  . IR ANGIO VERTEBRAL SEL VERTEBRAL UNI R MOD SED  02/13/2020  . IR CT HEAD LTD  02/13/2020  . IR CT HEAD LTD  02/15/2020  . IR PTA VENOUS EXCEPT DIALYSIS CIRCUIT  02/15/2020  . IR THROMBECT VENO MECH MOD SED  02/13/2020  . IR THROMBECT VENO MECH REPT MOD SED  02/15/2020  . RADIOLOGY WITH ANESTHESIA N/A 02/13/2020   Procedure: IR WITH ANESTHESIA;  Surgeon: Luanne Bras, MD;  Location: [facility];  Service: Radiology;  Laterality: N/A;  . RADIOLOGY WITH ANESTHESIA N/A 02/15/2020   Procedure: IR WITH ANESTHESIA;  Surgeon: Radiologist, Medication, MD;  Location: [facility];  Service: Radiology;  Laterality: N/A;  . TONSILLECTOMY    . WISDOM TOOTH EXTRACTION      Assessment & Plan Clinical Impression: Jill Clarke. Hollingshead is a 35 year old left handed female with history of T2DM, sickle cell trait who was admitted on 02/12/2020 with one week history of N/V, headaches and diarrhea.  History taken from chart review and patient. She was seen in the ED on 02/10/2020-work-up was negative and she was discharged home.  She was readmitted on 02/12/2020 with nausea and MRI brain done revealing patchy acute infarcts in bilateral cerebral deep watershed white matter and along high left sylvian fissure with focal swelling and small volume SAH. 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 SAH felt to be in setting of significant thrombosis and she was started on hypertonic saline. She underwent cerebral angio with revascularization of occluded right and left transverse sinuses and partially of right sigmoid sinus with improvement in flow and decompression of venous system. She was noted to be somnolent with decreased movement on the right.  EEG was done, which showed moderate diffuse encephalopathy with delta slowing and bitemporal region. 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  Hospital course further complicated by hypercoagulability, panel showed low protein S and recommendations are to repeat in 6 to 8 weeks as this may be due to acute phase reaction.  Cerebral edema treated with hypertonic saline and phenylephrine added to help manage hypotension with SBP goal 120-160. Repeat MRV showed improved flow but occlusive clot still seen in superior sinus, distal right transverse and right sigmoid dural sinus with lack of recanalization of the vein of [place]. She underwent repeat cerebral angio  01/23  with complete revascularization of SSS, straight sinus, vein of [place], ICD and right transverse sinus.  Follow-up EEG showed diffuse encephalopathy and Dr. [name] recommended continuing [place] for now. She was started on IV heparin and has been transitioned to Pradaxa on 01/31. She tolerated extubation without difficulty and diet has been  advanced to regular diet. She was started on vancomycin and cefepime for mastoiditis on 02/15/2020-->narrowed to Unasyn. She continues to be limtied by HA, dizziness/lightheadedness, balance deficits with poor attention and tendency to bump into items on the right as well as difficulty completing ADL tasks. She has had decline in ability to carry out ADLs and as well as mobility therefore CIR recommended for progressive therapies. Patient transferred to CIR on 02/28/2020 .   Patient currently requires min with mobility secondary to muscle weakness, decreased cardiorespiratoy endurance, unbalanced muscle activation and decreased coordination, decreased attention, decreased safety awareness and decreased memory and decreased sitting balance, decreased standing balance and hemiplegia.  Prior to hospitalization, patient was independent  with mobility and lived with Family in a House home.  Home access is 8Stairs to enter.  Patient will benefit from skilled PT intervention to maximize safe functional mobility, minimize fall risk and decrease caregiver burden for planned discharge home with 24 hour supervision.  Anticipate patient will benefit from follow up OP at discharge.  PT - End of Session Activity Tolerance: Tolerates 30+ min activity with multiple rests Endurance Deficit: Yes Endurance Deficit Description: generalized weakness, fatigue with activity PT Assessment Rehab Potential (ACUTE/IP ONLY): Good PT Barriers to Discharge: Home environment access/layout;Behavior (decreased safety awareness, impaired memory) PT Plan PT Intensity: Minimum of 1-2 x/day ,45 to 90 minutes PT Frequency: 5 out of 7 days PT Duration Estimated Length of Stay: 7-10 days PT Treatment/Interventions: Ambulation/gait training;Balance/vestibular training;Cognitive remediation/compensation;Community reintegration;Discharge planning;Functional mobility training;Neuromuscular re-education;Pain management;Patient/family  education;Psychosocial support;Stair training;Therapeutic Activities;Therapeutic Exercise;UE/LE Strength taining/ROM;UE/LE Coordination activities;Visual/perceptual remediation/compensation PT Recommendation Recommendations for Other Services: Therapeutic Recreation consult Therapeutic Recreation Interventions: Stress management Follow Up Recommendations: Outpatient PT Patient destination: Home Equipment Recommended: None recommended by PT   PT Evaluation Precautions/Restrictions Precautions Precautions: Fall;Other (comment) (Reduced awareness) Precaution Comments: fall, reduced awareness General   Vital Signs Pain   Home Living/Prior Functioning Home Living Available Help at Discharge: Family Type of Home: House Home Access: Stairs to enter Technical brewer of Steps: 8 Entrance Stairs-Rails: Can reach both Home Layout: One level  Lives With: Family Prior Function Level of Independence: Independent with gait;Independent with transfers  Able to Take Stairs?: Yes Driving: Yes Vocation: Full time employment Vocation Requirements: Worked at [employer] in [place]- lifting and carrying Vision/Perception  Perception Perception: Impaired Inattention/Neglect: Does not attend to right visual field Praxis Praxis: Intact  Cognition Overall Cognitive Status: Impaired/Different from baseline Arousal/Alertness: Awake/alert Attention: Focused;Sustained Focused Attention: Appears intact Sustained Attention: Impaired Sustained Attention Impairment: Verbal basic;Functional basic Memory: Impaired Memory Impairment: Storage deficit;Decreased short term memory;Decreased recall of new information Awareness: Impaired Awareness Impairment: Intellectual impairment Problem Solving: Impaired Problem Solving Impairment: Functional basic Safety/Judgment: Impaired Comments: Impulsivity, inattention to R side Sensation Sensation Light Touch: Appears Intact Coordination Gross Motor  Movements are Fluid and Coordinated: No Coordination and Movement Description: muscle weakness, poor balance, inattention Motor  Motor Motor: Hemiplegia Motor - Skilled Clinical Observations: very mild R hemi   Trunk/Postural Assessment  Cervical Assessment Cervical Assessment: Within Functional Limits Thoracic Assessment Thoracic Assessment: Within Functional Limits Lumbar Assessment Lumbar Assessment: Within Functional Limits Postural Control Postural Control: Deficits on evaluation Righting Reactions: delayed  Balance Balance Balance Assessed: Yes Standardized Balance Assessment Standardized Balance Assessment: Berg Balance Test (52/59) Berg Balance Test Sit to Stand: Able to stand without using hands and stabilize independently Standing Unsupported: Able to stand 2 minutes with supervision Sitting with Back Unsupported but Feet Supported on Floor or Stool: Able to sit safely and securely 2 minutes Stand to Sit: Sits safely with minimal use of hands Transfers: Able to transfer with verbal cueing and /or supervision Standing Unsupported with Eyes Closed: Able to stand 10 seconds safely Standing Ubsupported with Feet Together: Able to place feet together independently and stand for 1 minute with supervision From Standing, Reach Forward with Outstretched Arm: Can reach forward >12 cm safely (5") From Standing Position, Pick up Object from Floor: Able to pick up shoe, needs supervision From Standing Position, Turn to Look Behind Over each Shoulder: Needs supervision when turning Turn 360 Degrees: Needs close supervision or verbal cueing Standing Unsupported, Alternately Place Feet on Step/Stool: Able to complete 4 steps without aid or supervision Standing Unsupported, One Foot in Front: Able to place foot tandem independently and hold 30 seconds Standing on One Leg: Able to lift leg independently and hold > 10 seconds Total Score: 42 Static Sitting Balance Static Sitting -  Balance Support: Feet supported Static Sitting - Level of Assistance: 5: Stand by assistance Dynamic Sitting Balance Dynamic Sitting - Balance Support: Feet supported;During functional activity Dynamic Sitting - Level of Assistance: 5: Stand by assistance Static Standing Balance Static Standing - Balance Support: During functional activity Static Standing - Level of Assistance: 4: Min assist Static Stance: Eyes closed Extremity Assessment      RLE Assessment RLE Assessment: Exceptions to [facility] General Strength Comments: See below for specific impairments RLE Strength RLE Overall Strength: Deficits Right Hip Flexion: 4+/5 Right Knee Flexion: 4/5 Right Knee Extension: 5/5 Right Ankle Dorsiflexion: 5/5 Right Ankle Plantar Flexion: 5/5 LLE Assessment LLE Assessment: Within Functional Limits General Strength Comments: Grossly 5/5  Care Tool Care Tool Bed Mobility Roll left and right activity   Roll left and right assist level: Independent    Sit to lying activity   Sit to lying assist level: Supervision/Verbal cueing    Lying to sitting edge of bed activity   Lying to sitting edge of bed assist level: Supervision/Verbal cueing     Care Tool Transfers Sit to stand transfer   Sit to stand assist level: Contact Guard/Touching assist    Chair/bed transfer   Chair/bed transfer assist level: Contact Guard/Touching assist     Toilet transfer   Assist Level: Contact Guard/Touching assist    Car transfer   Car transfer assist level: Supervision/Verbal cueing      Care Tool Locomotion Ambulation   Assist level: Contact Guard/Touching assist   Max distance: 150  Walk 10 feet activity   Assist level: Contact Guard/Touching assist     Walk 50 feet with 2 turns activity   Assist level: Contact Guard/Touching assist    Walk 150 feet activity   Assist level: Contact Guard/Touching assist    Walk 10 feet on uneven surfaces activity   Assist level: Contact Guard/Touching  assist    Stairs   Assist level: Contact Guard/Touching assist Stairs assistive device: 2 hand rails    Walk up/down 1 step activity   Walk up/down 1 step (curb) assist level: Contact Guard/Touching assist Walk up/down 1 step or curb assistive device: No device    Walk up/down 4 steps activity Walk up/down 4 steps assist level:  Contact Guard/Touching assist Walk up/down 4 steps assistive device: 2 hand rails  Walk up/down 12 steps activity   Walk up/down 12 steps assist level: Contact Guard/Touching assist Walk up/down 12 steps assistive device: 2 hand rails  Pick up small objects from floor   Pick up small object from the floor assist level: Supervision/Verbal cueing    Wheelchair Will patient use wheelchair at discharge?: No          Wheel 50 feet with 2 turns activity      Wheel 150 feet activity        Refer to Care Plan for Long Term Goals  SHORT TERM GOAL WEEK 1 PT Short Term Goal 1 (Week 1): STG=LTG due to ELOS  Recommendations for other services: Therapeutic Recreation  Stress management  Skilled Therapeutic Intervention  Evaluation completed (see details above and below) with education on PT POC and goals and individual treatment initiated with focus on improving endurance, improving balance, gait and stair training.  Pt received in bed with friend present and is agreeable to evaluation. Pt states her biggest goal is to work on her memory. Pt supervision with bed mobility due to impulsivity and decreased attention to R side. Sit <> stand and bed <> chair transfers CGA.   Orthostatics taken this session: Supine: 97/66 Sitting EOB: 105/83 Standing 104/74  Gait x200 ft, 150 ft, 200 ft, x 100 ft with CGA for minimal LOB and safety awareness. Pt demonstrates step through pattern, with difficulty maintaining course due to balance and R sided inattention. Occasionally bumps into things on R side. Pt performed 12 stairs with CGA, reciprocal pattern ascending, step to  pattern descending. Pt states she is tired, but able to perform stairs. Pt performed car transfer with supervision. Pt navigated ramp, uneven surface, and stepping down from uneven surface with CGA and no UE support. Pt demonstrated small LOB and decreased safety awareness throughout.   Patient demonstrates increased fall risk as noted by score of   42/56 on Berg Balance Scale.  (<36= high risk for falls, close to 100%; 37-45 significant >80%; 46-51 moderate >50%; 52-55 lower >25%)  Pt returned to bed after session and was left with all needs in reach and bed alarm active.     Mobility Bed Mobility Bed Mobility: Rolling Right;Rolling Left;Supine to Sit;Sit to Supine Rolling Right: Independent Rolling Left: Independent Supine to Sit: Supervision/Verbal cueing Sit to Supine: Supervision/Verbal cueing Transfers Transfers: Sit to Stand;Stand Pivot Transfers;Stand to Sit Sit to Stand: Contact Guard/Touching assist Stand to Sit: Contact Guard/Touching assist Stand Pivot Transfers: Contact Guard/Touching assist Transfer (Assistive device): None Locomotion  Gait Assistive device: None Gait Gait Pattern: Impaired (ataxic at times) Gait velocity: reduced Stairs / Additional Locomotion Stairs: Yes Stairs Assistance: Contact Guard/Touching assist Stair Management Technique: Two rails;Alternating pattern;Step to pattern Height of Stairs: 6 Ramp: Contact Guard/touching assist Curb: Nurse, mental health Mobility: No   Discharge Criteria: Patient will be discharged from PT if patient refuses treatment 3 consecutive times without medical reason, if treatment goals not met, if there is a change in medical status, if patient makes no progress towards goals or if patient is discharged from hospital.  The above assessment, treatment plan, treatment alternatives and goals were discussed and mutually agreed upon: by patient  Sharen Counter, SPT 02/29/2020, 5:40  PM

## 2020-02-29 NOTE — Progress Notes (Signed)
[facility] PHYSICAL MEDICINE & REHABILITATION PROGRESS NOTE  Subjective/Complaints: Patient seen sitting up in bed this morning.  He states he slept well overnight.  She states she is ready begin therapies.  ROS: Denies CP, SOB, N/V/D  Objective: Vital Signs: Blood pressure 104/72, pulse 84, temperature 98.2 F (36.8 C), temperature source Oral, resp. rate 15, height 5\' 5"  (1.651 m), last menstrual period 02/03/2020, SpO2 100 %. No results found. Recent Labs    02/27/20 0500 02/28/20 0413  WBC 7.4 6.0  HGB 8.8* 8.4*  HCT 28.4* 27.3*  PLT 504* 462*   Recent Labs    02/28/20 0413  NA 139  K 3.6  CL 102  CO2 28  GLUCOSE 114*  BUN 6  CREATININE 0.45  CALCIUM 9.2    Intake/Output Summary (Last 24 hours) at 02/29/2020 0847 Last data filed at 02/28/2020 1854 Gross per 24 hour  Intake 240 ml  Output --  Net 240 ml        Physical Exam: BP 104/72 (BP Location: Left Arm)   Pulse 84   Temp 98.2 F (36.8 C) (Oral)   Resp 15   Ht 5\' 5"  (1.651 m)   LMP 02/03/2020 (Approximate)   SpO2 100%   BMI 25.46 kg/m  Constitutional: No distress . Vital signs reviewed. HENT: Normocephalic.  Atraumatic. Eyes: EOMI. No discharge. Cardiovascular: No JVD.  RRR.Marland Kitchen Respiratory: Normal effort.  No stridor.  Bilateral clear to auscultation. GI: Non-distended.  BS +. Skin: Warm and dry.  Intact. Psych: Blunt.  Normal behavior. Musc: No edema in extremities.  No tenderness in extremities. Neuro: Alert and oriented, except for date of month Motor: RUE: 5/5 proximal to distal LUE: 5/5 proximal to distal RLE: HF 5/5, KE 4+/5, ADF 4/5 LLE: HF: 4+/5, KE, ADF 4-/5, stable Sensation intact to light touch   Assessment/Plan: 1. Functional deficits which require 3+ hours per day of interdisciplinary therapy in a comprehensive inpatient rehab setting.  Physiatrist is providing close team supervision and 24 hour management of active medical problems listed below.  Physiatrist and rehab team  continue to assess barriers to discharge/monitor patient progress toward functional and medical goals   Care Tool:  Bathing              Bathing assist       Upper Body Dressing/Undressing Upper body dressing        Upper body assist      Lower Body Dressing/Undressing Lower body dressing            Lower body assist       Toileting Toileting    Toileting assist Assist for toileting: Contact Guard/Touching assist     Transfers Chair/bed transfer  Transfers assist           Locomotion Ambulation   Ambulation assist      Assist level: Contact Guard/Touching assist       Walk 10 feet activity   Assist     Assist level: Contact Guard/Touching assist     Walk 50 feet activity   Assist           Walk 150 feet activity   Assist           Walk 10 feet on uneven surface  activity   Assist           Wheelchair     Assist               Wheelchair 50 feet with 2 turns  activity    Assist            Wheelchair 150 feet activity     Assist           Medical Problem List and Plan: 1.  HA, dizziness/lightheadedness, balance deficits with poor attention,  decline in ability to carry out ADLs as well as mobility secondary to bilateral SAH, watershed infarcts, and venous thrombosis  Begin CIR evaluations 2.  Antithrombotics: -DVT/anticoagulation:  Pharmaceutical: Pradexa             -antiplatelet therapy: N/A 3. Headaches/Pain Management: Tylenol prn.  4. Mood: LCSW to follow for evaluation and support.              -antipsychotic agents: N/A 5. Neuropsych: This patient is not fully capable of making decisions on her own behalf. 6. Skin/Wound Care: Routine pressure relief measures.  7. Fluids/Electrolytes/Nutrition: Monitor I/Os .             CMP ordered for tomorrow 8. 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    Monitor for breakthrough seizures while in  rehab 9. ABLA: Monitor CBC with serial checks             Transfused with 1 unit on 01/24  Hemoglobin 8.4 on 2/5             Labs ordered for tomorrow 10. Mastoiditis: Complete course of Unasyn 11. Hypotension: SBP goal 120-160 range. Long term normotensive.              Monitor with increased exertion 12.  Lethargy: On amantadine for attention/activation. Keppra decreased to 500 mg on 02/24/20   LOS: 1 days A FACE TO FACE EVALUATION WAS PERFORMED  Madylyn Insco Lorie Phenix 02/29/2020, 8:47 AM

## 2020-03-01 ENCOUNTER — Other Ambulatory Visit: Payer: Self-pay

## 2020-03-01 LAB — CBC WITH DIFFERENTIAL/PLATELET
Abs Immature Granulocytes: 0.09 10*3/uL — ABNORMAL HIGH (ref 0.00–0.07)
Basophils Absolute: 0 10*3/uL (ref 0.0–0.1)
Basophils Relative: 1 %
Eosinophils Absolute: 0.3 10*3/uL (ref 0.0–0.5)
Eosinophils Relative: 5 %
HCT: 31.6 % — ABNORMAL LOW (ref 36.0–46.0)
Hemoglobin: 9.7 g/dL — ABNORMAL LOW (ref 12.0–15.0)
Immature Granulocytes: 2 %
Lymphocytes Relative: 34 %
Lymphs Abs: 2.1 10*3/uL (ref 0.7–4.0)
MCH: 29 pg (ref 26.0–34.0)
MCHC: 30.7 g/dL (ref 30.0–36.0)
MCV: 94.3 fL (ref 80.0–100.0)
Monocytes Absolute: 0.6 10*3/uL (ref 0.1–1.0)
Monocytes Relative: 9 %
Neutro Abs: 3.1 10*3/uL (ref 1.7–7.7)
Neutrophils Relative %: 49 %
Platelets: 554 10*3/uL — ABNORMAL HIGH (ref 150–400)
RBC: 3.35 MIL/uL — ABNORMAL LOW (ref 3.87–5.11)
RDW: 14.7 % (ref 11.5–15.5)
WBC: 6.2 10*3/uL (ref 4.0–10.5)
nRBC: 0 % (ref 0.0–0.2)

## 2020-03-01 LAB — COMPREHENSIVE METABOLIC PANEL
ALT: 35 U/L (ref 0–44)
AST: 26 U/L (ref 15–41)
Albumin: 3.1 g/dL — ABNORMAL LOW (ref 3.5–5.0)
Alkaline Phosphatase: 48 U/L (ref 38–126)
Anion gap: 9 (ref 5–15)
BUN: 10 mg/dL (ref 6–20)
CO2: 28 mmol/L (ref 22–32)
Calcium: 9.6 mg/dL (ref 8.9–10.3)
Chloride: 100 mmol/L (ref 98–111)
Creatinine, Ser: 0.56 mg/dL (ref 0.44–1.00)
GFR, Estimated: >60 mL/min (ref >60)
Glucose, Bld: 98 mg/dL (ref 70–99)
Potassium: 3.9 mmol/L (ref 3.5–5.1)
Sodium: 137 mmol/L (ref 135–145)
Total Bilirubin: 0.7 mg/dL (ref 0.3–1.2)
Total Protein: 6.9 g/dL (ref 6.5–8.1)

## 2020-03-01 NOTE — Progress Notes (Signed)
Inpatient Rehabilitation Care Coordinator Assessment and Plan Patient Details  Name: Jill Clarke MRN: [id number] Date of Birth: 1985/10/29  Today's Date: 03/01/2020  Hospital Problems: Principal Problem:   Cerebral thrombosis with cerebral infarction Active Problems:   Cerebral vein thrombosis   Hypotension  Past Medical History:  Past Medical History:  Diagnosis Date  . Asthma    uses inhaler prn  . Diabetes mellitus without complication ([place])   . Sickle cell trait ([place])   . Unspecified high-risk pregnancy 01/27/2011   Past Surgical History:  Past Surgical History:  Procedure Laterality Date  . IR ANGIO INTRA EXTRACRAN SEL COM CAROTID INNOMINATE UNI R MOD SED  02/13/2020  . IR ANGIO INTRA EXTRACRAN SEL COM CAROTID INNOMINATE UNI R MOD SED  02/15/2020  . IR ANGIO INTRA EXTRACRAN SEL INTERNAL CAROTID UNI L MOD SED  02/13/2020  . IR ANGIO VERTEBRAL SEL VERTEBRAL UNI R MOD SED  02/13/2020  . IR CT HEAD LTD  02/13/2020  . IR CT HEAD LTD  02/15/2020  . IR PTA VENOUS EXCEPT DIALYSIS CIRCUIT  02/15/2020  . IR THROMBECT VENO MECH MOD SED  02/13/2020  . IR THROMBECT VENO MECH REPT MOD SED  02/15/2020  . RADIOLOGY WITH ANESTHESIA N/A 02/13/2020   Procedure: IR WITH ANESTHESIA;  Surgeon: Luanne Bras, MD;  Location: [facility];  Service: Radiology;  Laterality: N/A;  . RADIOLOGY WITH ANESTHESIA N/A 02/15/2020   Procedure: IR WITH ANESTHESIA;  Surgeon: Radiologist, Medication, MD;  Location: [facility];  Service: Radiology;  Laterality: N/A;  . TONSILLECTOMY    . WISDOM TOOTH EXTRACTION     Social History:  reports that she has quit smoking. She has never used smokeless tobacco. She reports that she does not drink alcohol and does not use drugs.  Family / Support Systems Marital Status: Divorced Patient Roles: Parent,Other (Comment) (employee) Children: Has a 83 & 41 yo Other Supports: Banita Lehn  [phone] Anticipated Caregiver: Joycelyn Schmid Ability/Limitations of Caregiver: Can  assist caring for pt's children now while pt is here. Caregiver Availability: 24/7 Family Dynamics: Close with Mom and children, Mom helps pt out with her children. Pt is grateful to have her and doesn't know what she would do without her.  Social History Preferred language: English Religion: None Cultural Background: No issues Education: HS Read: Yes Write: Yes Employment Status: Employed Name of Employer: [employer] Return to Work Plans: Hopefully will be able to return, she needs to talk with them Legal History/Current Legal Issues: No issues Guardian/Conservator: according to MD pt is not fully capable of making her own decisions for herself, so will go back to Mom since only family and caring for her children. Hopefully with progress she will regain this capacity   Abuse/Neglect Abuse/Neglect Assessment Can Be Completed: Yes Physical Abuse: Denies Verbal Abuse: Denies Sexual Abuse: Denies Exploitation of patient/patient's resources: Denies Self-Neglect: Denies  Emotional Status Pt's affect, behavior and adjustment status: Pt is motivated to do well and discharge from the hospital. She is doing so much better and feels she has made good progress since coming to rehab. She hopes to be home soon Recent Psychosocial Issues: other health issues was not going to a MD prior to admission Psychiatric History: No history do feel with her young age she would benefit from seeing neuro-psych while here. Substance Abuse History: No issues  Patient / Family Perceptions, Expectations & Goals Pt/Family understanding of illness & functional limitations: Pt and Mom can explain her stroke, although pt doesn't realize all of  her medical issues. Mom has spoken with the MD and has a good understanding of her daughter's issues Premorbid pt/family roles/activities: Mom, daughter, employee, friend, etc Anticipated changes in roles/activities/participation: resume Pt/family expectations/goals: Pt states:  " I hope to go home soon and see my kids."  Mom states: " I hope she is doing well and doesn;t need much help."  [employer]: None Premorbid Home Care/DME Agencies: None Transportation available at discharge: Family pt drove PTA Resource referrals recommended: Neuropsychology  Discharge Planning Living Arrangements: [place]: Dance movement psychotherapist Type of Residence: Private residence Insurance Resources: Multimedia programmer (specify) ([employer]) Financial Resources: Water quality scientist Screen Referred: No Living Expenses: Lives with family Money Management: Patient,Family Does the patient have any problems obtaining your medications?: Yes (Describe) (doesn;t have a PCP) Home Management: Mom and pt Patient/Family Preliminary Plans: Return to [place] home where her children are and will hopefully go back to work when able. Team feels can reach supervision level goals, which Mom can provide Care Coordinator Barriers to Discharge: Medication compliance Care Coordinator Anticipated Follow Up Needs: HH/OP  Clinical Impression Pleasant young female who is laid back and carefree regarding being in the hospital. She does miss her children and wants to be home with them. Her Mom is her main support and currently taking care of her children and will assist her when discharged. Will need to set up with PCP prior to discharge.  Elease Hashimoto 03/01/2020, 10:08 AM

## 2020-03-01 NOTE — Progress Notes (Signed)
Inpatient Rehabilitation  Patient information reviewed and entered into eRehab system by Dorwin Fitzhenry M. Antwanette Wesche, M.A., CCC/SLP, PPS Coordinator.  Information including medical coding, functional ability and quality indicators will be reviewed and updated through discharge.    

## 2020-03-01 NOTE — Progress Notes (Signed)
Occupational Therapy Session Note  Patient Details  Name: Jill Clarke MRN: [id number] Date of Birth: 11-17-1985  Today's Date: 03/01/2020 OT Individual Time: 3967-2897 OT Individual Time Calculation (min): 55 min    Short Term Goals: Week 1:  OT Short Term Goal 1 (Week 1): Pt will don bra with supervision OT Short Term Goal 2 (Week 1): Pt will maintain selective attention during ADLs with min cueing OT Short Term Goal 3 (Week 1): Pt will complete toileting tasks with supervision  Skilled Therapeutic Interventions/Progress Updates:    Pt seen this session for ADL training with a focus on balance and R side coordination.  Pt did very well today, only needing Supervision except for min A to fasten bra. She is able to unfasten and fasten bra when clasp is in front of her but due to the bra style (underwire) it was too difficult to twist around her torso.  Pt ambulated to bathroom and back with Supervision.  No LOB and the only proprioceptive limitations noted this session were with her challenge reaching her back to wash it and fastening her bra.  Because she is so mobile, pt often gets up and walks on her own. Spent quite a bit of time with her explaining why she is not ready to do that yet and that she needs to call for help. Explained chair alarm system.  Pt nodded in agreement.    She opted to sit in recliner with chair alarm on and all needs met.  Call light in reach.    Therapy Documentation Precautions:  Precautions Precautions: Fall,Other (comment) (Reduced awareness) Precaution Comments: fall, reduced awareness Restrictions Weight Bearing Restrictions: No  Pain: Pain Assessment Pain Score: 0-No pain ADL: ADL Eating: Modified independent Where Assessed-Eating: Edge of bed Grooming: Supervision/safety Where Assessed-Grooming: Edge of bed Upper Body Bathing: Supervision/safety Where Assessed-Upper Body Bathing: Shower Lower Body Bathing: Supervision/safety Where  Assessed-Lower Body Bathing: Shower Upper Body Dressing: Minimal assistance Where Assessed-Upper Body Dressing: Edge of bed Lower Body Dressing: Supervision/safety Where Assessed-Lower Body Dressing: Edge of bed Toileting: Supervision/safety Where Assessed-Toileting: Glass blower/designer: Close supervision Armed forces technical officer Method: Magazine features editor: Close supervision Social research officer, government Method: Heritage manager: Shower seat with back   Therapy/Group: Individual Therapy  [place] 03/01/2020, 10:14 AM

## 2020-03-01 NOTE — Progress Notes (Signed)
Physical Therapy Session Note  Patient Details  Name: Jill Clarke MRN: [id number] Date of Birth: May 07, 1985  Today's Date: 03/01/2020 PT Individual Time: 4742-5956 PT Individual Time Calculation (min): 56 min   Short Term Goals: Week 1:  PT Short Term Goal 1 (Week 1): STG=LTG due to ELOS  Skilled Therapeutic Interventions/Progress Updates:    Pt received sitting upright in recliner, awake and agreeable to therapy. No reports of pain. She initially had difficulty understanding scheduling and was unaware that she had therapy scheduled at this time. It took some effort to educate her on policies, scheduling, etc. Pt ultimately understandable. Pt requesting to "work on her memory" as she reports this is her primary deficits since her stroke. She was able to don tennis shoes with setupA but noted difficulty with tieing them with use of R hand due to limited FMC/dexterity. Pt reporting urgent need to void, so she ambulated to the bathroom with supervision and no AD, able to manage lower body dressing with distant supervision, continent of bladder. She then ambulated to ortho gym, ~225ft, with close supervision and no AD. Gait deficits include decreased gait speed, increased lateral unsteadiness, ?R inattention. Focused remainder of session on NMR for memory and balance. While at the [facility] system, performed several tasks including memory with verbal/visual presentation and geoboard duplication. With memory, she was able to get up to 5 words and numbers but struggled with anything more than that. She had 58% accuracy with words and 57% accuracy with numbers. With geoboard duplication, she was unable to draw accuractely with both the 5 second and 10 second flashing time. In fact, she was unable to draw accurately without any flashing time with simple duplication drawing of 1 line geoboard. Pt also had difficulty understanding and becoming aware of her errors with drawing duplicating geboards despite obvious  defects. Pt reporting mental fatigue after completing these tasks, ambulated back to her room with close supervision with similar gait deficits noted above, ~273ft. She ended session seated in recliner with chair alarm on and her needs within reach.   Therapy Documentation Precautions:  Precautions Precautions: Fall,Other (comment) (Reduced awareness) Precaution Comments: fall, reduced awareness Restrictions Weight Bearing Restrictions: No  Therapy/Group: Individual Therapy  [name] P Seabron Iannello PT 03/01/2020, 2:57 PM

## 2020-03-01 NOTE — Progress Notes (Signed)
[place] PHYSICAL MEDICINE & REHABILITATION PROGRESS NOTE  Subjective/Complaints: No complaints this morning. Denies pain, constipation, insomnia.  Discussed lab results with her.  BP soft at 98/59.   ROS: Denies CP, SOB, N/V/D  Objective: Vital Signs: Blood pressure (!) 98/59, pulse 100, temperature 98.8 F (37.1 C), resp. rate 18, height 5\' 5"  (1.651 m), last menstrual period 02/03/2020, SpO2 100 %. No results found. Recent Labs    02/28/20 0413 03/01/20 0644  WBC 6.0 6.2  HGB 8.4* 9.7*  HCT 27.3* 31.6*  PLT 462* 554*   Recent Labs    02/28/20 0413 03/01/20 0644  NA 139 137  K 3.6 3.9  CL 102 100  CO2 28 28  GLUCOSE 114* 98  BUN 6 10  CREATININE 0.45 0.56  CALCIUM 9.2 9.6    Intake/Output Summary (Last 24 hours) at 03/01/2020 1008 Last data filed at 03/01/2020 0850 Gross per 24 hour  Intake 1260 ml  Output --  Net 1260 ml        Physical Exam: BP (!) 98/59   Pulse 100   Temp 98.8 F (37.1 C)   Resp 18   Ht 5\' 5"  (1.651 m)   LMP 02/03/2020 (Approximate)   SpO2 100%   BMI 25.46 kg/m  Gen: no distress, normal appearing HEENT: oral mucosa pink and moist, NCAT Cardio: Reg rate Chest: normal effort, normal rate of breathing Abd: soft, non-distended Ext: no edema Psych: Blunt.  Normal behavior. Musc: No edema in extremities.  No tenderness in extremities. Neuro: Alert and oriented, except for date of month Motor: RUE: 5/5 proximal to distal LUE: 5/5 proximal to distal RLE: HF 5/5, KE 4+/5, ADF 4/5 LLE: HF: 4+/5, KE, ADF 4-/5, stable Sensation intact to light touch   Assessment/Plan: 1. Functional deficits which require 3+ hours per day of interdisciplinary therapy in a comprehensive inpatient rehab setting.  Physiatrist is providing close team supervision and 24 hour management of active medical problems listed below.  Physiatrist and rehab team continue to assess barriers to discharge/monitor patient progress toward functional and medical  goals   Care Tool:  Bathing    Body parts bathed by patient: Right arm,Left upper leg,Left arm,Right lower leg,Chest,Abdomen,Left lower leg,Face,Front perineal area,Buttocks,Right upper leg         Bathing assist Assist Level: Supervision/Verbal cueing     Upper Body Dressing/Undressing Upper body dressing   What is the patient wearing?: Pull over shirt,Bra    Upper body assist Assist Level: Minimal Assistance - Patient > 75%    Lower Body Dressing/Undressing Lower body dressing      What is the patient wearing?: Underwear/pull up,Pants     Lower body assist Assist for lower body dressing: Supervision/Verbal cueing     Toileting Toileting    Toileting assist Assist for toileting: Supervision/Verbal cueing     Transfers Chair/bed transfer  Transfers assist     Chair/bed transfer assist level: Supervision/Verbal cueing     Locomotion Ambulation   Ambulation assist      Assist level: Contact Guard/Touching assist   Max distance: 150   Walk 10 feet activity   Assist     Assist level: Contact Guard/Touching assist     Walk 50 feet activity   Assist    Assist level: Contact Guard/Touching assist      Walk 150 feet activity   Assist    Assist level: Contact Guard/Touching assist      Walk 10 feet on uneven surface  activity  Assist     Assist level: Contact Guard/Touching assist     Wheelchair     Assist Will patient use wheelchair at discharge?: No   Wheelchair activity did not occur: N/A         Wheelchair 50 feet with 2 turns activity    Assist    Wheelchair 50 feet with 2 turns activity did not occur: N/A       Wheelchair 150 feet activity     Assist  Wheelchair 150 feet activity did not occur: N/A        Medical Problem List and Plan: 1.  HA, dizziness/lightheadedness, balance deficits with poor attention,  decline in ability to carry out ADLs as well as mobility secondary to bilateral  SAH, watershed infarcts, and venous thrombosis  Continue CIR 2.  Antithrombotics: -DVT/anticoagulation:  Pharmaceutical: Pradexa             -antiplatelet therapy: N/A 3. Headaches/Pain Management: Tylenol prn.  4. Mood: LCSW to follow for evaluation and support.              -antipsychotic agents: N/A 5. Neuropsych: This patient is not fully capable of making decisions on her own behalf. 6. Skin/Wound Care: Routine pressure relief measures.  7. Fluids/Electrolytes/Nutrition: Monitor I/Os .             CMP reviewed and electrolytes stable. 8. 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    Monitor for breakthrough seizures while in rehab 9. ABLA: Monitor CBC with serial checks             Transfused with 1 unit on 01/24  Hemoglobin 8.4 on 2/5             Hgb 9.7 on 2/7, repeat weekly.  10. Mastoiditis: Complete course of Unasyn 11. Hypotension: SBP goal 120-160 range. Long term normotensive.              Monitor with increased exertion 12.  Lethargy: On amantadine for attention/activation. Keppra decreased to 500 mg on 02/24/20 13. Hypoalbuminemia: 3.1 on 2/7- discussed choosing more high protein foods.    LOS: 2 days A FACE TO FACE EVALUATION WAS PERFORMED  Kissie Ziolkowski P [place] 03/01/2020, 10:08 AM

## 2020-03-01 NOTE — Progress Notes (Signed)
[place] Individual Statement of Services  Patient Name:  Jill Clarke  Date:  03/01/2020  Welcome to the [facility].  Our goal is to provide you with an individualized program based on your diagnosis and situation, designed to meet your specific needs.  With this comprehensive rehabilitation program, you will be expected to participate in at least 3 hours of rehabilitation therapies Monday-Friday, with modified therapy programming on the weekends.  Your rehabilitation program will include the following services:  Physical Therapy (PT), Occupational Therapy (OT), Speech Therapy (ST), 24 hour per day rehabilitation nursing, Neuropsychology, Care Coordinator, Rehabilitation Medicine, Nutrition Services and Pharmacy Services  Weekly team conferences will be held on Wednesday to discuss your progress.  Your Inpatient Rehabilitation Care Coordinator will talk with you frequently to get your input and to update you on team discussions.  Team conferences with you and your family in attendance may also be held.  Expected length of stay: 7-10 days  Overall anticipated outcome: supervision cues  Depending on your progress and recovery, your program may change. Your Inpatient Rehabilitation Care Coordinator will coordinate services and will keep you informed of any changes. Your Inpatient Rehabilitation Care Coordinator's name and contact numbers are listed  below.  The following services may also be recommended but are not provided by the [place] will be made to provide these services after discharge if needed.  Arrangements include referral to agencies that provide these services.  Your insurance has been verified to be:  [employer] Your primary doctor is:  None  Pertinent information  will be shared with your doctor and your insurance company.  Inpatient Rehabilitation Care Coordinator:  Ovidio Kin, [place] or Emilia Beck  Information discussed with and copy given to patient by: Elease Hashimoto, 03/01/2020, 9:47 AM

## 2020-03-01 NOTE — Progress Notes (Signed)
Speech Language Pathology Daily Session Note  Patient Details  Name: Jill Clarke MRN: [id number] Date of Birth: 1985-02-02  Today's Date: 03/01/2020 SLP Individual Time: 1118-1200 SLP Individual Time Calculation (min): 42 min  Short Term Goals: Week 1: SLP Short Term Goal 1 (Week 1): STG=LTG due to ELOS  Skilled Therapeutic Interventions: Pt seen for skilled ST with focus on cognitive goals. Pt continues to endorse decline in memory and attention. Pt participating in portions of the ALFA with scores as follows: "Telling Time" - 90%, "Counting Money" - 60% and "Solving Daily Math Problems" - patient unable to finish 2' cognitive fatigue. Pt benefits from extra processing time and repetition throughout to increase participation, attention and accuracy of responses. Pt often stating "I'm trying to make it all connect". Pt re-educated on goals of ST as well as compensatory memory strategies to initiate (primarily "write it down" due to patient's hobby of writing). Pt left in recliner with alarm on and all needs within reach. Cont ST POC.   Pain Pain Assessment Pain Scale: 0-10 Pain Score: 0-No pain  Therapy/Group: Individual Therapy  Dewaine Conger 03/01/2020, 11:55 AM

## 2020-03-02 DIAGNOSIS — I959 Hypotension, unspecified: Secondary | ICD-10-CM

## 2020-03-02 DIAGNOSIS — R5383 Other fatigue: Secondary | ICD-10-CM

## 2020-03-02 DIAGNOSIS — D62 Acute posthemorrhagic anemia: Secondary | ICD-10-CM

## 2020-03-02 DIAGNOSIS — G08 Intracranial and intraspinal phlebitis and thrombophlebitis: Secondary | ICD-10-CM

## 2020-03-02 DIAGNOSIS — R569 Unspecified convulsions: Secondary | ICD-10-CM

## 2020-03-02 MED ORDER — EXERCISE FOR HEART AND HEALTH BOOK
Freq: Once | Status: AC
  Filled 2020-03-02: qty 1

## 2020-03-02 NOTE — Progress Notes (Signed)
Physician notified of EKG result, no new orders noted.

## 2020-03-02 NOTE — Progress Notes (Signed)
Yellow MEWS protocol in place d/t pt HR 115. Pt assessed, alert and oriented x4, no s/sx of distress, denies dizziness/CP/SOB Charge nurse and NP Reesa Chew notifed of pt vitals and status. New order for EKG and orthostatic vitals noted.

## 2020-03-02 NOTE — Progress Notes (Addendum)
Occupational Therapy Session Note  Patient Details  Name: Jill Clarke MRN: [id number] Date of Birth: Jun 16, 1985  Today's Date: 03/02/2020 OT Individual Time: 1430-1515 OT Individual Time Calculation (min): 45 min    Short Term Goals: Week 1:  OT Short Term Goal 1 (Week 1): Pt will don bra with supervision OT Short Term Goal 2 (Week 1): Pt will maintain selective attention during ADLs with min cueing OT Short Term Goal 3 (Week 1): Pt will complete toileting tasks with supervision  Skilled Therapeutic Interventions/Progress Updates:    Pt sitting EOB, no c/o pain, agreeable to OT session however due to receiving phone call at beginning of session, OT returned once pt completed.  Upon OT returning, pt ambulated to bathroom, and completed shower bench transfer at walk in shower level with distant supervision.  UB/LB dressing completed with supervision and increased time to unthread RUE from bra.  UB Bathing completed with mod I.  LB dressing completed with supervision including pt standing and raising LE to wash supported on tub bench without LOB noted.  Pt ambulated to EOB and dressed UB with min assist to clasp bra, otherwise donned shirt with setup.  Pt donned underwear and pants with setup as well.  Call bell in reach, bed alarm on at end of session. 15 minutes missed treatment at beginning of session due to pt taking phone call.  Therapy Documentation Precautions:  Precautions Precautions: Fall,Other (comment) (Reduced awareness) Precaution Comments: fall, reduced awareness Restrictions Weight Bearing Restrictions: No   Therapy/Group: Individual Therapy  Ezekiel Slocumb 03/02/2020, 4:21 PM

## 2020-03-02 NOTE — Discharge Instructions (Addendum)
Inpatient Rehab Discharge Instructions  Jill Clarke Discharge date and time:  03/05/20  Activities/Precautions/ Functional Status: Activity: no lifting, driving, or strenuous exercise till cleared by MD Diet: low fat, low cholesterol diet Wound Care: none needed    Functional status:  ___ No restrictions     ___ Walk up steps independently _X__ 24/7 supervision/assistance   ___ Walk up steps with assistance ___ Intermittent supervision/assistance  ___ Bathe/dress independently ___ Walk with walker     ___ Bathe/dress with assistance ___ Walk Independently    ___ Shower independently ___ Walk with assistance    _X__ Shower with assistance _X__ No alcohol     ___ Return to work/school ________   Special Instructions: 1. No driving till cleared by Neurology.     COMMUNITY REFERRALS UPON DISCHARGE:    Outpatient: PT [facility] SP             Agency:[facility] [phone]              Appointment Date/Time:WILL CALL YOU TO SET UP APPOINTMENTS  Medical Equipment/Items Ordered:NO NEEDS                                                   My questions have been answered and I understand these instructions. I will adhere to these goals and the provided educational materials after my discharge from the hospital.  Patient/Caregiver Signature _______________________________ Date __________  Clinician Signature _______________________________________ Date __________  Please bring this form and your medication list with you to all your follow-up doctor's appointments. Information on my medicine - Pradaxa (dabigatran)  This medication education was reviewed with me or my healthcare representative as part of my discharge preparation.    Why was Pradaxa prescribed for you? Pradaxa was prescribed to treat blood clots that may have been found in the veins of your legs (deep vein thrombosis) or in your lungs (pulmonary embolism) and to reduce the risk of them  occurring again.   What do you Need to know about PradAXa? Take your Pradaxa TWICE DAILY - one capsule in the morning and one tablet in the evening with or without food.  It would be best to take the doses about the same time each day.  The capsules should not be broken, chewed or opened - they must be swallowed whole.  Do not store Pradaxa in other medication containers - once the bottle is opened the Pradaxa should be used within FOUR months; throw away any capsules that havent been by that time.  Take Pradaxa exactly as prescribed by your doctor.  DO NOT stop taking Pradaxa without talking to the doctor who prescribed the medication.  Refill your prescription before you run out.  After discharge, you should have regular check-up appointments with your healthcare provider that is prescribing your Pradaxa.  In the future your dose may need to be changed if your kidney function or weight changes by a significant amount.  What do you do if you miss a dose? If you miss a dose, take it as soon as you remember on the same day.  If your next dose is less than 6 hours away, skip the missed dose.  Do not take two doses of PRADAXA at the same time.  Important Safety Information A possible side effect of Pradaxa is bleeding. You should call  your healthcare provider right away if you experience any of the following: ? Bleeding from an injury or your nose that does not stop. ? Unusual colored urine (red or dark brown) or unusual colored stools (red or black). ? Unusual bruising for unknown reasons. ? A serious fall or if you hit your head (even if there is no bleeding).  Some medicines may interact with Pradaxa and might increase your risk of bleeding or clotting while on Pradaxa. To help avoid this, consult your healthcare provider or pharmacist prior to using any new prescription or non-prescription medications, including herbals, vitamins, non-steroidal anti-inflammatory drugs (NSAIDs) and  supplements.  This website has more information on Pradaxa (dabigatran): www.[employer].cz.

## 2020-03-02 NOTE — Progress Notes (Signed)
   03/02/20 0746  Assess: MEWS Score  Temp 98.2 F (36.8 C)  BP 93/60  Pulse Rate (!) 115  Resp 16  SpO2 100 %  O2 Device Room Air  Assess: MEWS Score  MEWS Temp 0  MEWS Systolic 1  MEWS Pulse 2  MEWS RR 0  MEWS LOC 0  MEWS Score 3  MEWS Score Color Yellow  Treat  MEWS Interventions Other (Comment) (PA notified at 0803.)  Pain Scale 0-10  Pain Score 0  Escalate  MEWS: Escalate Yellow: discuss with charge nurse/RN and consider discussing with provider and RRT  Notify: Charge Nurse/RN  Name of Charge Nurse/RN Notified [place]  Date Charge Nurse/RN Notified 03/02/20  Time Charge Nurse/RN Notified 4128  Notify: Provider  Provider Name/Title Algis Liming, PA  Date Provider Notified 03/02/20  Time Provider Notified [phone]  Notification Type Call  Notification Reason Other (Comment) (pulse rate up.)  Response No new orders;Other (Comment) (Monitor per protocol)  Date of Provider Response 03/02/20  Time of Provider Response [phone]  Document  Patient Outcome  (Pulse holding stedy. Patient feels no change.)

## 2020-03-02 NOTE — Progress Notes (Signed)
PHYSICAL MEDICINE & REHABILITATION PROGRESS NOTE  Subjective/Complaints: Patient seen sitting up in bed this morning, conversing on the phone.  She states she slept well overnight.  She remained on the phone throughout the encounter.  ROS: Denies CP, SOB, N/V/D  Objective: Vital Signs: Blood pressure 101/64, pulse (!) 118, temperature 98.2 F (36.8 C), temperature source Oral, resp. rate 17, height 5\' 5"  (1.651 m), last menstrual period 02/03/2020, SpO2 100 %. No results found. Recent Labs    03/01/20 0644  WBC 6.2  HGB 9.7*  HCT 31.6*  PLT 554*   Recent Labs    03/01/20 0644  NA 137  K 3.9  CL 100  CO2 28  GLUCOSE 98  BUN 10  CREATININE 0.56  CALCIUM 9.6    Intake/Output Summary (Last 24 hours) at 03/02/2020 0843 Last data filed at 03/02/2020 0700 Gross per 24 hour  Intake 1070 ml  Output --  Net 1070 ml        Physical Exam: BP 101/64 (BP Location: Left Arm)   Pulse (!) 118   Temp 98.2 F (36.8 C) (Oral)   Resp 17   Ht 5\' 5"  (1.651 m)   LMP 02/03/2020 (Approximate)   SpO2 100%   BMI 25.46 kg/m  Constitutional: No distress . Vital signs reviewed. HENT: Normocephalic.  Atraumatic. Eyes: EOMI. No discharge. Cardiovascular: No JVD.  RRR. Respiratory: Normal effort.  No stridor.  Bilateral clear to auscultation. GI: Non-distended.  BS +. Skin: Warm and dry.  Intact. Psych: Flat.  Normal behavior. Musc: No edema in extremities.  No tenderness in extremities. Neuro; Alert Motor: RUE: 5/5 proximal to distal LUE: 5/5 proximal to distal RLE: HF 5/5, KE 4+/5, ADF 4/5 LLE: HF: 4+/5, KE, ADF 4-/5, improving  Assessment/Plan: 1. Functional deficits which require 3+ hours per day of interdisciplinary therapy in a comprehensive inpatient rehab setting.  Physiatrist is providing close team supervision and 24 hour management of active medical problems listed below.  Physiatrist and rehab team continue to assess barriers to discharge/monitor patient  progress toward functional and medical goals   Care Tool:  Bathing    Body parts bathed by patient: Right arm,Left upper leg,Left arm,Right lower leg,Chest,Abdomen,Left lower leg,Face,Front perineal area,Buttocks,Right upper leg         Bathing assist Assist Level: Supervision/Verbal cueing     Upper Body Dressing/Undressing Upper body dressing   What is the patient wearing?: Pull over shirt,Bra    Upper body assist Assist Level: Minimal Assistance - Patient > 75%    Lower Body Dressing/Undressing Lower body dressing      What is the patient wearing?: Underwear/pull up,Pants     Lower body assist Assist for lower body dressing: Supervision/Verbal cueing     Toileting Toileting    Toileting assist Assist for toileting: Contact Guard/Touching assist     Transfers Chair/bed transfer  Transfers assist     Chair/bed transfer assist level: Supervision/Verbal cueing     Locomotion Ambulation   Ambulation assist      Assist level: Contact Guard/Touching assist   Max distance: 150   Walk 10 feet activity   Assist     Assist level: Contact Guard/Touching assist     Walk 50 feet activity   Assist    Assist level: Contact Guard/Touching assist      Walk 150 feet activity   Assist    Assist level: Contact Guard/Touching assist      Walk 10 feet on uneven surface  activity  Assist     Assist level: Contact Guard/Touching assist     Wheelchair     Assist Will patient use wheelchair at discharge?: No   Wheelchair activity did not occur: N/A         Wheelchair 50 feet with 2 turns activity    Assist    Wheelchair 50 feet with 2 turns activity did not occur: N/A       Wheelchair 150 feet activity     Assist  Wheelchair 150 feet activity did not occur: N/A        Medical Problem List and Plan: 1.  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  Antithrombotics: -DVT/anticoagulation:  Pharmaceutical: Pradexa             -antiplatelet therapy: N/A 3. Headaches/Pain Management: Tylenol prn.  4. Mood: LCSW to follow for evaluation and support.              -antipsychotic agents: N/A 5. Neuropsych: This patient is not fully capable of making decisions on her own behalf. 6. Skin/Wound Care: Routine pressure relief measures.  7. Fluids/Electrolytes/Nutrition: Monitor I/Os .             BMP WNL on 2/7 8. 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    No breakthrough seizures from admission to 2/8 9. ABLA: Monitor CBC with serial checks             Transfused with 1 unit on 01/24  Hemoglobin 9.7 on 2/7 10. Mastoiditis: Complete course of Unasyn 11. Hypotension: SBP goal 120-160 range. Long term normotensive.              Not on any prescription medications at present  Relatively soft on 2/8  Monitor with increased exertion 12.  Lethargy: On amantadine for attention/activation. Keppra decreased to 500 mg on 02/24/20  Amantadine DC'd on 2/8, continue to monitor 13. Hypoalbuminemia: 3.1 on 2/7- discussed choosing more high protein foods.    LOS: 3 days A FACE TO FACE EVALUATION WAS PERFORMED  Andy Allende Lorie Phenix 03/02/2020, 8:43 AM

## 2020-03-02 NOTE — Progress Notes (Signed)
Physical Therapy Session Note  Patient Details  Name: Jill Clarke MRN: [id number] Date of Birth: 1985/04/26  Today's Date: 03/02/2020 PT Individual Time: 0915-1010 + 1300-1340 PT Individual Time Calculation (min): 55 min  + 40 min  Short Term Goals: Week 1:  PT Short Term Goal 1 (Week 1): STG=LTG due to ELOS  Skilled Therapeutic Interventions/Progress Updates:     1st session: Pt received supine in bed, awake talking on the phone to family. Pt unaware of scheduled time for therapy session. Even though we went over this yesterday, she required education on scheduling system and reviewed the schedule with the handout that was provided yesterday. Also educated her on purposes of rehab as she was c/o having to work her brain yesterday. She stated several times, "I'm over it" (meaning therapy). Eventually she was redirectable to our session. She performed supine<>sit mod I with bed features. Able to sit EOB with supervision and she donned tennis shoes with setupA although requiring extra time/effort for tieing her shoe laces. She reported being pleased being able to do this although she completed this task yesterday as well. Removed hospital gown with minA for untieing in the back and provided privacy for changing her t-shirt which she completed indep. Sit<>stand with supervision and no AD and she ambulated within her room, looking for her hair bonnet; demonstrated appropriate ability to navigate tight spaces to locate hair bonnet. At sinkside, she completed oral care with setupA, using her LUE to brush teeth. She then reported urgent need to void, so she ambulated the bathroom with supervision rather quickly with cues for paced activity and safety awareness. Pt able to manage lower body dressing with distant supervision, stand>sit to toilet and pt was continent of bladder and bowel, charted. Ambulate back to bed with supervision and NT arriving to assess routine vitals. Noted sitting BP to be soft,  reading 98/73 with HR 116. Retaken in standing, reading 98/77 with HR 135. Oxygen 100% on RA. Attempted to retreive knee-high TED's but unable to locate in clean utility. Notified RN of low BP and asked to have TED's ordered. Pt denies any lightheadedness or dizziness but does report feeling tired. W/c transport from her room to ortho gym for time management and energy conservation. While seated in w/c, performed BITS for NMR working on memory to carryover into functional mobility tasks. She completed geoboard duplication, using LUE to draw (pt is L handed). She was able to complete with 75% accuracy of moderate complexity but she had significant trouble with error recognition, requiring max cues for this. Pt was returned to her room in her w/c, stand<>pivot with supervision to her bed. She was able to remove her tennis shoes indep and she completed sit>supine mod I with bed features. Reminded her of her upcoming schedule therapy for the afternoon and she ended session with needs in reach and bed alarm on.  2nd session: Pt received laying in bed, awake and agreeable to therapy. Pt denies pain but reports feeling "drained." She was able to recall her lunch without difficulty but unable to assess accuracy with no tray in room. Supine<>sit mod I with bed features. She donned shoes with setupA with increased difficulty tieing them due to RUE [facility] deficits. Pt reporting need to void, ambulated to bathroom with supervision and pt was continent of bladder. She then ambulated ~168ft with CGA to day room gym. Performed NMR with foam pad and tasked to place clothespin on line. Required constraint-induced to prevent her form using her  LUE. She also struggled with motor apraxia and planning on how to use the clothes pin and how to use a claw pinch grasp. Eventually, she improved with this with repetitions. She then completed the PVC puzzle, a simple square, requiring min cues with appropriate ability to perform visual-spatial  recognition with no errors made which is interesting compared to her difficulty with geoboard on BITS sytem. Pt then ambulated back to her room from day room gym with supervision, ended session supine in bed with needs in reach and bed alarm on. Made patient aware of her upcoming therapy schedule with OT, pt verbalized understanding.  Therapy Documentation Precautions:  Precautions Precautions: Fall,Other (comment) (Reduced awareness) Precaution Comments: fall, reduced awareness Restrictions Weight Bearing Restrictions: No  Therapy/Group: Individual Therapy  Jill Clarke Jill Clarke PT 03/02/2020, 7:38 AM

## 2020-03-03 DIAGNOSIS — R414 Neurologic neglect syndrome: Secondary | ICD-10-CM

## 2020-03-03 NOTE — Patient Care Conference (Signed)
Inpatient RehabilitationTeam Conference and Plan of Care Update Date: 03/03/2020   Time: 11:42 AM    Patient Name: Jill Clarke      Medical Record Number: [id number]  Date of Birth: [birth year] Sex: Female         Room/Bed: 4W08C/4W08C-01 Payor Info: Payor: [employer]  / Plan: [employer] / Product Type: *No Product type* /    Admit Date/Time:  02/28/2020 12:36 PM  Primary Diagnosis:  Cerebral thrombosis with cerebral infarction [facility])  Hospital Problems: Principal Problem:   Cerebral thrombosis with cerebral infarction Active Problems:   Cerebral vein thrombosis   Hypotension   Lethargy    Expected Discharge Date: Expected Discharge Date: 03/05/20  Team Members Present: Physician leading conference: Dr. Delice Lesch Care Coodinator Present: Dorien Chihuahua, RN, BSN, CRRN;Becky Dupree, LCSW Nurse Present: Other (comment) Philip Aspen, RN) PT Present: Ginnie Smart, PT OT Present: Meriel Pica, OT SLP Present: Jettie Booze, CF-SLP PPS Coordinator present : Gunnar Fusi, SLP     Current Status/Progress Goal Weekly Team Focus  Bowel/Bladder   Continent of bowel and bladder. Incontinent x1 of bladder r/t urgency.  Remain continet.  Assess toileting needs every round.   Swallow/Nutrition/ Hydration             ADL's   Supervision to ambulate to bathroom, mod I toileting, distant S bathing and dressing except for A with clasping bra and tying shoes  supervision with dynamic balance, toilet and tub transfers, and simple meal prep. (may be able to upgrade to mod I); mod I bathing, Independent with dressing  ADL training, balance, R side awareness and coordination, cognition - memory, problem solving, pt education   Mobility   Supervision overall  supervision/mod I  dynamic standing balance, gait training, dual-cog tasks, memory/recall -  concern for returning to driving!!   Communication             Safety/Cognition/ Behavioral Observations  Min A  Supervision   mildly complex problem solving, memory, selective attention, emergent awareness   Pain   Ocassional headache 7 to 9 out of 10. Tylenol effective.  Equal to or less than 3.  Assess q shift and PRN.   Skin   No skin issues.  No skin breakdown.  Assess q shift.     Discharge Planning:  Home with Mom who takes care of her two children (7 & 8) can provide supervision. Pt will be a safety risk due to feels can drive and not fully aware of her deficits.   Team Discussion: BP soft; MD monitoring and weaning seizure prophylaxis. Otherwise medically stable. Progress limited by memory, error awareness issues. Previous right side inattention, poor coordination and preperception has improved; good balance noted.   Patient on target to meet rehab goals: yes, able to toilet independently; can be left in the bathroom solo and requires distant supervision for showers.Supervision overall with PT.   *See Care Plan and progress notes for long and short-term goals.   Revisions to Treatment Plan:  BIT system for attention issues  Teaching Needs: Safety, medications, etc.   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, watershed infarcts, and venous thrombosis  Barriers to Discharge: Behavior;Medical stability   Possible Resolutions to [employer] Focus: Therapies, pt and family edu, monitor BP,  follow Hb, monitor for seizures   Continued Need for Acute Rehabilitation Level of Care: The patient requires daily medical management by a physician with specialized training in physical medicine and rehabilitation for the following reasons: Direction of a multidisciplinary physical rehabilitation program to maximize functional independence :  Yes Medical management of patient stability for increased activity during participation in an intensive rehabilitation regime.: Yes Analysis of laboratory values and/or radiology reports with any subsequent need for medication adjustment and/or medical intervention. : Yes   I attest that I was present, lead the team conference, and concur with the assessment and plan of the team.   Dorien Chihuahua B 03/03/2020, 1:41 PM

## 2020-03-03 NOTE — Progress Notes (Signed)
Occupational Therapy Session Note  Patient Details  Name: Jill Clarke MRN: [id number] Date of Birth: 1985-12-28  Today's Date: 03/03/2020 OT Individual Time: 1400-1500 OT Individual Time Calculation (min): 60 min    Short Term Goals: Week 1:  OT Short Term Goal 1 (Week 1): Pt will don bra with supervision OT Short Term Goal 2 (Week 1): Pt will maintain selective attention during ADLs with min cueing OT Short Term Goal 3 (Week 1): Pt will complete toileting tasks with supervision  Skilled Therapeutic Interventions/Progress Updates:    Pt semi upright in bed, agreeable to OT session, no c/o pain. Pt ambulated approximately 200 feet without AD with supervision needing occasional VCs to avoid obstacles on right side.  Pt completed cooking task in kitchen using stovetop to make spaghetti including item retrieval and straining of noodles in standing at ambulatory level with distant supervision primarily due to novel environment. Pt demonstrated good safety awareness throughout task.  Pt requesting to use bathroom.  Pt ambulated to toilet, toilet transfer, and toileting with distant supervision.  Pt had continent episode of urine.  Pt washed hands standing at sink with mod I.  Pt ambulated back to room approximately 200 feet without AD, also requiring occasional VCs to avoid obstacles on right side.  No LOB noted throughout ambulation.  Pt sitting EOB at end of session, call bell in reach, bed alarm on.  Therapy Documentation Precautions:  Precautions Precautions: Fall,Other (comment) (Reduced awareness) Precaution Comments: fall, reduced awareness Restrictions Weight Bearing Restrictions: No General:     Therapy/Group: Individual Therapy  Ezekiel Slocumb 03/03/2020, 5:08 PM

## 2020-03-03 NOTE — Progress Notes (Signed)
Speech Language Pathology Daily Session Note  Patient Details  Name: Jill Clarke MRN: [id number] Date of Birth: December 11, 1985  Today's Date: 03/03/2020 SLP Individual Time: 4680-3212 SLP Individual Time Calculation (min): 26 min  Short Term Goals: Week 1: SLP Short Term Goal 1 (Week 1): STG=LTG due to ELOS  Skilled Therapeutic Interventions: Pt was seen for skilled ST targeting cognitive goals. Pt received in her room from OT. SLP facilitated session with a semi-complex monthly calendar/scheduling task. Pt required overall Supervision A verbal and visual cues for error awareness and problem solving strategies. One verbal cue provided for organization within task. Pt attended to tasks and intermittently divided attention between conversation and writing on calendar Mod I. She does continue to report decline in her functional short term memory, therefore she would continue to benefit from education and skilled interventions targeting compensatory memory strategies and cognitive rehabilitation.  Pt left sitting in recliner with seat alarm set and needs within reach. Continue per current plan of care.          Pain Pain Assessment Pain Scale: 0-10 Pain Score: 0-No pain  Therapy/Group: Individual Therapy  Jill Clarke 03/03/2020, 7:21 AM

## 2020-03-03 NOTE — Progress Notes (Signed)
Physical Therapy Session Note  Patient Details  Name: Jill Clarke MRN: [id number] Date of Birth: 1985/10/26  Today's Date: 03/03/2020 PT Individual Time: 1015-1100 PT Individual Time Calculation (min): 45 min   Short Term Goals: Week 1:  PT Short Term Goal 1 (Week 1): STG=LTG due to ELOS  Skilled Therapeutic Interventions/Progress Updates:    Pt received sitting upright in recliner, talking on the phone to her brother. Recognizes me as her therapist but required therapist intervening to end phone conversation and pt required redirection and encouragement to participate. Sit<>stand with distant supervision and she ambulated ~259ft with supervision and no AD to main therapy gym. Navigated up/down x12 steps with supervision and no hand rail with self-selected sequencing of alternating pattern. She then ambulated to ortho gym with supervision and performed car transfer with supervision. She then performed NMR with BITS system while standing. She completed the Maze test without difficulty, trail making sequencing (#1-#25) with min cues, and also completed geoboard duplication which she showed significant improvement on since prior sessions as she was able to complete with min cues but also showed improved error recognition. She then ambulated back to her room with supervision and ended session supine in bed with needs in reach and bed alarm on.  Therapy Documentation Precautions:  Precautions Precautions: Fall,Other (comment) (Reduced awareness) Precaution Comments: fall, reduced awareness Restrictions Weight Bearing Restrictions: No General:    Therapy/Group: Individual Therapy  Monnie Anspach P Derek Laughter PT 03/03/2020, 7:41 AM

## 2020-03-03 NOTE — Progress Notes (Signed)
Patient ID: Jill Clarke, female   DOB: 23-Jan-1986, 35 y.o.   MRN: [id number] Met with pt and spoke with mom via telephone to discuss team conference goals supervision level and discharge date 2/11. No equipment needs and follow up PT and SP as OP. Will make referral for OP therapies. Mom can provide supervision level. Both agreeable to discharge and recommendations.

## 2020-03-03 NOTE — Progress Notes (Signed)
[facility] PHYSICAL MEDICINE & REHABILITATION PROGRESS NOTE  Subjective/Complaints: Patient seen laying in bed this AM.  She states she slept well overnight.  She would like to know her discharge date.   ROS: Denies CP, SOB, N/V/D  Objective: Vital Signs: Blood pressure 100/68, pulse 98, temperature 97.6 F (36.4 C), temperature source Oral, resp. rate 16, height 5\' 5"  (1.651 m), last menstrual period 02/03/2020, SpO2 100 %. No results found. Recent Labs    03/01/20 0644  WBC 6.2  HGB 9.7*  HCT 31.6*  PLT 554*   Recent Labs    03/01/20 0644  NA 137  K 3.9  CL 100  CO2 28  GLUCOSE 98  BUN 10  CREATININE 0.56  CALCIUM 9.6    Intake/Output Summary (Last 24 hours) at 03/03/2020 0957 Last data filed at 03/02/2020 1700 Gross per 24 hour  Intake 120 ml  Output --  Net 120 ml        Physical Exam: BP 100/68 (BP Location: Right Arm)   Pulse 98   Temp 97.6 F (36.4 C) (Oral)   Resp 16   Ht 5\' 5"  (1.651 m)   LMP 02/03/2020 (Approximate)   SpO2 100%   BMI 25.46 kg/m  Constitutional: No distress . Vital signs reviewed. HENT: Normocephalic.  Atraumatic. Eyes: EOMI. No discharge. Cardiovascular: No JVD.  RRR. Respiratory: Normal effort.  No stridor.  Bilateral clear to auscultation. GI: Non-distended.  BS +. Skin: Warm and dry.  Intact. Psych: Flat. Normal behavior. Musc: No edema in extremities.  No tenderness in extremities. Neuro; Alert Motor: RUE: 5/5 proximal to distal LUE: 5/5 proximal to distal RLE: HF 5/5, KE 4+/5, ADF 4-/5 LLE: HF: 4+/5, KE, ADF 4-/5  Assessment/Plan: 1. Functional deficits which require 3+ hours per day of interdisciplinary therapy in a comprehensive inpatient rehab setting.  Physiatrist is providing close team supervision and 24 hour management of active medical problems listed below.  Physiatrist and rehab team continue to assess barriers to discharge/monitor patient progress toward functional and medical goals   Care  Tool:  Bathing    Body parts bathed by patient: Right arm,Left upper leg,Left arm,Right lower leg,Chest,Abdomen,Left lower leg,Face,Front perineal area,Buttocks,Right upper leg         Bathing assist Assist Level: Supervision/Verbal cueing     Upper Body Dressing/Undressing Upper body dressing   What is the patient wearing?: Pull over shirt,Bra    Upper body assist Assist Level: Minimal Assistance - Patient > 75%    Lower Body Dressing/Undressing Lower body dressing      What is the patient wearing?: Underwear/pull up,Pants     Lower body assist Assist for lower body dressing: Supervision/Verbal cueing     Toileting Toileting    Toileting assist Assist for toileting: Independent     Transfers Chair/bed transfer  Transfers assist     Chair/bed transfer assist level: Supervision/Verbal cueing     Locomotion Ambulation   Ambulation assist      Assist level: Contact Guard/Touching assist   Max distance: 150   Walk 10 feet activity   Assist     Assist level: Contact Guard/Touching assist     Walk 50 feet activity   Assist    Assist level: Contact Guard/Touching assist      Walk 150 feet activity   Assist    Assist level: Contact Guard/Touching assist      Walk 10 feet on uneven surface  activity   Assist     Assist level: Contact  Guard/Touching assist     Wheelchair     Assist Will patient use wheelchair at discharge?: No   Wheelchair activity did not occur: N/A         Wheelchair 50 feet with 2 turns activity    Assist    Wheelchair 50 feet with 2 turns activity did not occur: N/A       Wheelchair 150 feet activity     Assist  Wheelchair 150 feet activity did not occur: N/A        Medical Problem List and Plan: 1.  HA, dizziness/lightheadedness, balance deficits with poor attention,  decline in ability to carry out ADLs as well as mobility secondary to bilateral SAH, watershed infarcts, and  venous thrombosis  Continue CIR  Team conference today to discuss current and goals and coordination of care, home and environmental barriers, and discharge planning with nursing, case manager, and therapies. Please see conference note from today as well.  2.  Antithrombotics: -DVT/anticoagulation:  Pharmaceutical: Pradexa             -antiplatelet therapy: N/A 3. Headaches/Pain Management: Tylenol prn.  4. Mood: LCSW to follow for evaluation and support.              -antipsychotic agents: N/A 5. Neuropsych: This patient is not fully capable of making decisions on her own behalf. 6. Skin/Wound Care: Routine pressure relief measures.  7. Fluids/Electrolytes/Nutrition: Monitor I/Os .             BMP WNL on 2/7 8. 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    No breakthrough seizures from admission to 2/9 9. ABLA: Monitor CBC with serial checks             Transfused with 1 unit on 01/24  Hemoglobin 9.7 on 2/7 10. Mastoiditis: Complete course of Unasyn 11. Hypotension: SBP goal 120-160 range. Long term normotensive.              Not on any prescription medications at present  Relatively soft on 2/9  Monitor with increased exertion 12.  Lethargy: On amantadine for attention/activation. Keppra decreased to 500 mg on 02/24/20  Amantadine DC'd on 2/8, continue to monitor, tolerating 13. Hypoalbuminemia: 3.1 on 2/7- High protein foods.    LOS: 4 days A FACE TO FACE EVALUATION WAS PERFORMED  Anthonny Schiller Lorie Phenix 03/03/2020, 9:57 AM

## 2020-03-03 NOTE — Consult Note (Signed)
Neuropsychological Consultation   Patient:   Jill Clarke   DOB:   01-Dec-1985  MR Number:  [id number]  Location:  [facility] [id number] [place] [address] Dept: [place]: [phone]           Date of Service:   03/03/2020  Start Time:   3 PM End Time:   4 PM  Provider/Observer:  Ilean Skill, Psy.D.       Clinical Neuropsychologist       Billing Code/Service: 81829  Chief Complaint:    Jill Clarke is a 35 year old female with a history of type 2 diabetes, sickle cell trait. Patient was admitted on 02/12/2020 with 1 week history of nausea and vomiting, headaches and diarrhea. Patient was seen in the ED on 02/10/2020 with work-up negative and she was discharged home. Patient was readmitted on 02/12/2020 with nausea and MRI brain done revealing patchy acute infarcts in bilateral cerebral deep watershed white matter along high left sylvian fissure with focal swelling and small volume subarachnoid hemorrhage. Patient underwent cerebral angio with revascularization of occluded right and left transverse sinuses and partially of right sigmoid sinus with improvement in flow and decompression of venous system. She was noted to be somnolent with decreased movement on the right. EEG was done which showed moderate diffuse encephalopathy with delta slowing and bitemporal regions. Patient was started on Keppra for focal motor seizures. Patient with ongoing issues of poor attention and visual as well as proprioceptive deficits consistent with significant neurological neglect syndrome.  Reason for Service:  Patient was referred for neuropsychological consultation due to residual changes in deficits from recent CVA. Below is the HPI for the current admission for greater detail.  HPI:  Jill Clarke is a 35 year old left handed female with history of T2DM, sickle cell trait who was  admitted on 02/12/2020 with one week history of N/V, headaches and diarrhea. History taken from chart review and patient. She was seen in the ED on 02/10/2020-work-up was negative and she was discharged home.  She was readmitted on 02/12/2020 with nausea and MRI brain done revealing patchy acute infarcts in bilateral cerebral deep watershed white matter and along high left sylvian fissure with focal swelling and small volume SAH. 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 SAH felt to be in setting of significant thrombosis and she was started on hypertonic saline. She underwent cerebral angio with revascularization of occluded right and left transverse sinuses and partially of right sigmoid sinus with improvement in flow and decompression of venous system. She was noted to be somnolent with decreased movement on the right.  EEG was done, which showed moderate diffuse encephalopathy with delta slowing and bitemporal region. 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  Hospital course further complicated by hypercoagulability, panel showed low protein S and recommendations are to repeat in 6 to 8 weeks as this may be due to acute phase reaction.  Cerebral edema treated with hypertonic saline and phenylephrine added to help manage hypotension with SBP goal 120-160. Repeat MRV showed improved flow but occlusive clot still seen in superior sinus, distal right transverse and right sigmoid dural sinus with lack of recanalization of the vein of [place]. She underwent repeat cerebral angio 01/23  with complete revascularization of SSS, straight sinus,  vein of [place], ICD and right transverse sinus.  Follow-up EEG showed diffuse encephalopathy and Dr. [name] recommended continuing [place] for now. She was started on IV heparin and has been transitioned to  Pradaxa on 01/31. She tolerated extubation without difficulty and diet has been advanced to regular diet. She was started on vancomycin and cefepime for mastoiditis on 02/15/2020-->narrowed to Unasyn. She continues to be limtied by HA, dizziness/lightheadedness, balance deficits with poor attention and tendency to bump into items on the right as well as difficulty completing ADL tasks. She has had decline in ability to carry out ADLs and as well as mobility therefore CIR recommended for progressive therapies.  Please see preadmission assessment from earlier today as well.   Current Status:  Upon entering the room, the patient was awake and alert and talking to her children's father on the phone. While she acknowledged me appropriately the patient did not identify the need to end the conversation with her father's children on the phone and showed limited awareness of clear social cues. The patient's expressive and receptive language was quite good but it was clear that her executive functioning and decision-making was poor. The patient acknowledged repeated difficulties with bumping into objects on the right side of her visual field and has also had difficulties with awareness of her right arm during therapies. The patient clearly has right visual field inattention as well as right proprioceptive deficits consistent with an overall neurologic neglect syndrome. The patient appeared to understand and began relating symptoms that she has had related to neglect symptoms but described limited awareness and difficulties with overall comprehension of these facets of her neglect syndrome. The patient's memory appears to be intact as she was able to effectively describe many aspects about her life and described therapeutic efforts and other activities she is engaged in during her CIR. However some of the aspects of her recent experiences did not freely occurred to her in free recall appear to be somewhat impaired but  cueing helped considerably.  Behavioral Observation: Jill Clarke  presents as a 35 y.o.-year-old Left African American Female who appeared her stated age. her dress was Appropriate and she was Well Groomed and her manners were Appropriate, inappropriate to the situation.  her participation was indicative of Intrusive and Redirectable behaviors.  There were not physical disabilities noted.  she displayed an appropriate level of cooperation and motivation.     Interactions:    Active Inattentive and Redirectable  Attention:   abnormal and attention span appeared shorter than expected for age  Memory:   within normal limits; recent and remote memory intact  Visuo-spatial:  abnormal  Speech (Volume):  normal  Speech:   normal; normal  Thought Process:  Circumstantial and Tangential  Though Content:  WNL; not suicidal and not homicidal  Orientation:   person, place and time/date  Judgment:   Poor  Planning:   Poor  Affect:    Appropriate  Mood:    Euthymic  Insight:   Shallow  Intelligence:   normal  Medical History:   Past Medical History:  Diagnosis Date  . Asthma    uses inhaler prn  . Diabetes mellitus without complication ([place])   . Sickle cell trait ([place])   . Unspecified high-risk pregnancy 01/27/2011         Patient Active Problem List   Diagnosis Date Noted  . Neurologic neglect syndrome   . Lethargy   . Hypotension   . Cerebral vein thrombosis 02/28/2020  .  Hypokalemia 02/28/2020  . Mastoiditis   . Acute blood loss anemia   . Seizures ([place])   . 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 Respiratory failure ([place])   . Acute cerebral venous sinus thrombosis 02/12/2020  . Unspecified contraceptive management 10/02/2011  . Unspecified high-risk pregnancy 01/27/2011  . Low birth weight 01/23/2011  . Asthma 01/23/2011  . Short interval  between pregnancies complicating pregnancy, antepartum 01/23/2011  . Nausea and vomiting in pregnancy 12/28/2010         Psychiatric History:  No prior psychiatric history  Family Med/Psych History:  Family History  Problem Relation Age of Onset  . Sickle cell trait Mother   . Depression Mother   . Asthma Father   . Drug abuse Father        heroin abuse  . Kidney disease Father        due to heroin abuse  . Diabetes Maternal Grandmother     Risk of Suicide/Violence: low there are no risk of suicide or violence with the patient's significant neglect syndrome does place her at great risk of harm and patient will need external guidance and external executive oversight.  Impression/DX:  Jill Clarke is a 35 year old female with a history of type 2 diabetes, sickle cell trait. Patient was admitted on 02/12/2020 with 1 week history of nausea and vomiting, headaches and diarrhea. Patient was seen in the ED on 02/10/2020 with work-up negative and she was discharged home. Patient was readmitted on 02/12/2020 with nausea and MRI brain done revealing patchy acute infarcts in bilateral cerebral deep watershed white matter along high left sylvian fissure with focal swelling and small volume subarachnoid hemorrhage. Patient underwent cerebral angio with revascularization of occluded right and left transverse sinuses and partially of right sigmoid sinus with improvement in flow and decompression of venous system. She was noted to be somnolent with decreased movement on the right. EEG was done which showed moderate diffuse encephalopathy with delta slowing and bitemporal regions. Patient was started on Keppra for focal motor seizures. Patient with ongoing issues of poor attention and visual as well as proprioceptive deficits consistent with significant neurological neglect syndrome.  Upon entering the room, the patient was awake and alert and talking to her children's father on the phone. While she  acknowledged me appropriately the patient did not identify the need to end the conversation with her father's children on the phone and showed limited awareness of clear social cues. The patient's expressive and receptive language was quite good but it was clear that her executive functioning and decision-making was poor. The patient acknowledged repeated difficulties with bumping into objects on the right side of her visual field and has also had difficulties with awareness of her right arm during therapies. The patient clearly has right visual field inattention as well as right proprioceptive deficits consistent with an overall neurologic neglect syndrome. The patient appeared to understand and began relating symptoms that she has had related to neglect symptoms but described limited awareness and difficulties with overall comprehension of these facets of her neglect syndrome. The patient's memory appears to be intact as she was able to effectively describe many aspects about her life and described therapeutic efforts and other activities she is engaged in during her CIR. However some of the aspects of her recent experiences did not freely occurred to her in free recall appear to be somewhat impaired but  cueing helped considerably.  Disposition/Plan:  The patient will be discharged home soon but will need considerable support upon discharge. The patient has significant neurologic neglect syndrome that goes beyond visual neglect. The patient is having difficulty with executive functioning, free recall of information but is able to effectively learn new information with help through cueing. Patient displayed both visual neglect as well as proprioceptive and somatosensory neglect. There is very likely involvement of the PTO junction in the left hemisphere.  Diagnosis:    CVA with severe neurological neglect syndrome        Electronically Signed   _______________________ Ilean Skill, Psy.D. Clinical  Neuropsychologist

## 2020-03-03 NOTE — Progress Notes (Signed)
Occupational Therapy Session Note  Patient Details  Name: Jill Clarke MRN: [id number] Date of Birth: 16-Jul-1985  Today's Date: 03/03/2020 OT Individual Time: 0830-0930 OT Individual Time Calculation (min): 60 min    Short Term Goals: Week 1:  OT Short Term Goal 1 (Week 1): Pt will don bra with supervision OT Short Term Goal 2 (Week 1): Pt will maintain selective attention during ADLs with min cueing OT Short Term Goal 3 (Week 1): Pt will complete toileting tasks with supervision  Skilled Therapeutic Interventions/Progress Updates:      Pt seen for BADL retraining of toileting, bathing, and dressing with a focus on R side awareness and coordination.  Pt quite distracted at start of session as she was worried about several calls that had been blocked on her cell phone. Mod cues to get pt to focus on getting started.  Pt picked out clothing from her bag and spent extra time sorting through clothes.  At one point her R hand "stuck" under clothing and she did not realize.  Cued her to look at her hand and then she noticed how it was placed. Distant S to ambulate to toilet and toilet with mod I. Sat on tub bench to bathe, standing propping foot on bench to wash legs with no LOB.  Ambulated to bed to dress.  Cued pt to sit down to don tight leggings over feet vs standing.  With cues, had pt fasten bra in front and then she was able to pull around to front.  She did well donning socks and tying shoes without difficulty.  She stood at sink to complete oral care without difficulty.   Pt with questions about what her therapy team is observing with her as far as deficits. Pt remarked on how much she has progressed and feels her progress is significant from where she was on admission on January 20. Pt sat in recliner with alarm on.  All needs met.   Therapy Documentation Precautions:  Precautions Precautions: Fall,Other (comment) (Reduced awareness) Precaution Comments: fall, reduced  awareness Restrictions Weight Bearing Restrictions: No       Pain: Pain Assessment Pain Scale: 0-10 Pain Score: 0-No pain ADL: ADL Eating: Modified independent Where Assessed-Eating: Edge of bed Grooming: Independent Where Assessed-Grooming: Standing at sink Upper Body Bathing: Supervision/safety Where Assessed-Upper Body Bathing: Shower Lower Body Bathing: Supervision/safety Where Assessed-Lower Body Bathing: Shower Upper Body Dressing: Supervision/safety Where Assessed-Upper Body Dressing: Edge of bed Lower Body Dressing: Supervision/safety Where Assessed-Lower Body Dressing: Edge of bed Toileting: Modified independent Where Assessed-Toileting: Glass blower/designer: Close supervision Armed forces technical officer Method: Magazine features editor: Close supervision Social research officer, government Method: Heritage manager: Shower seat with back   Therapy/Group: Individual Therapy  [place] 03/03/2020, 11:15 AM

## 2020-03-03 NOTE — Plan of Care (Signed)
  Problem: Consults Goal: RH STROKE PATIENT EDUCATION Description: See Patient Education module for education specifics  Outcome: Progressing   Problem: RH SAFETY Goal: RH STG ADHERE TO SAFETY PRECAUTIONS W/ASSISTANCE/DEVICE Description: STG Adhere to Safety Precautions With Mod I Assistance/Device. Outcome: Progressing Goal: RH STG DECREASED RISK OF FALL WITH ASSISTANCE Description: STG Decreased Risk of Fall With Mod I Assistance. Outcome: Progressing   Problem: RH COGNITION-NURSING Goal: RH STG USES MEMORY AIDS/STRATEGIES W/ASSIST TO PROBLEM SOLVE Description: STG Uses Memory Aids/Strategies With Mod I Assistance to Problem Solve. Outcome: Progressing   Problem: RH KNOWLEDGE DEFICIT Goal: RH STG INCREASE KNOWLEDGE OF HYPERTENSION Description: Mod I  Outcome: Progressing

## 2020-03-04 ENCOUNTER — Other Ambulatory Visit: Payer: Self-pay | Admitting: Physical Medicine and Rehabilitation

## 2020-03-04 MED ORDER — DABIGATRAN ETEXILATE MESYLATE 150 MG PO CAPS: 150.0000 mg | ORAL_CAPSULE | Freq: Two times a day (BID) | ORAL | 0 refills | Status: DC

## 2020-03-04 MED ORDER — ACETAMINOPHEN 325 MG PO TABS: 325.0000 mg | ORAL_TABLET | ORAL | Status: DC | PRN

## 2020-03-04 MED FILL — PRADAXA 150 MG CAP: 150 | 30 days supply | Qty: 60 | Fill #0

## 2020-03-04 NOTE — Discharge Summary (Signed)
Physical Therapy Discharge Summary  Patient Details  Name: Jill Clarke MRN: [id number] Date of Birth: [birth year]  Today's Date: 03/04/2020 PT Individual Time: 0800-0900 PT Individual Time Calculation (min): 60 min    Patient has met 9 of 9 long term goals due to improved activity tolerance, improved balance, improved postural control, increased strength, improved attention and improved awareness.  Patient to discharge at an ambulatory level Supervision.  Patient's care partner is independent to provide the necessary physical and cognitive assistance at discharge.  Reasons goals not met: n/a  Recommendation:  Patient will benefit from ongoing skilled PT services in outpatient setting to continue to advance safe functional mobility, address ongoing impairments in R hemibody weakness, R inattention/neglect, higher level cognitive functioning, and minimize fall risk.  Equipment: No equipment provided  Reasons for discharge: treatment goals met and discharge from hospital  Patient/family agrees with progress made and goals achieved: Yes  PT Discharge Precautions/Restrictions Precautions Precautions: Fall Precaution Comments: R inattention Restrictions Weight Bearing Restrictions: No Vital Signs Therapy Vitals Temp: 98 F (36.7 C) Pulse Rate: 95 Resp: 15 BP: 106/74 Patient Position (if appropriate): Lying Oxygen Therapy SpO2: 100 % Vision/Perception  Perception Perception: Impaired Inattention/Neglect: Does not attend to right visual field Praxis Praxis: Intact Praxis Impairment Details: Motor planning;Ideation  Cognition Overall Cognitive Status: Impaired/Different from baseline Arousal/Alertness: Awake/alert Orientation Level: Oriented X4 Attention: Focused;Sustained;Selective Focused Attention: Appears intact Sustained Attention: Impaired Sustained Attention Impairment: Functional basic;Functional complex Selective Attention: Impaired Selective Attention  Impairment: Functional basic;Functional complex Memory: Impaired Memory Impairment: Decreased short term memory;Decreased recall of new information Decreased Long Term Memory: Functional complex;Functional basic Decreased Short Term Memory: Functional basic;Functional complex Awareness: Appears intact Problem Solving: Impaired Problem Solving Impairment: Functional basic;Functional complex Self Correcting: Impaired Self Correcting Impairment: Functional basic;Functional complex Safety/Judgment: Appears intact Sensation Sensation Light Touch: Appears Intact Hot/Cold: Appears Intact Proprioception: Appears Intact Stereognosis: Not tested Coordination Gross Motor Movements are Fluid and Coordinated: Yes Fine Motor Movements are Fluid and Coordinated: Yes Coordination and Movement Description: R inattention Motor  Motor Motor: Hemiplegia Motor - Skilled Clinical Observations: Mild R hemi  Mobility Bed Mobility Bed Mobility: Rolling Right;Supine to Sit;Sit to Supine Rolling Right: Independent Rolling Left: Independent Supine to Sit: Independent Sit to Supine: Independent Transfers Transfers: Sit to Stand;Stand Pivot Transfers;Stand to Sit Sit to Stand: Independent Stand to Sit: Independent Stand Pivot Transfers: Independent Transfer (Assistive device): None Locomotion  Gait Ambulation: Yes Gait Assistance: Independent Gait Distance (Feet): 300 Feet Assistive device: None Gait Gait: Yes Gait Pattern: Within Functional Limits Gait Pattern: Lateral hip instability;Narrow base of support Gait velocity: reduced Stairs / Additional Locomotion Stairs: Yes Stairs Assistance: Independent Stair Management Technique: No rails Number of Stairs: 12 Height of Stairs: 6 Wheelchair Mobility Wheelchair Mobility: No  Trunk/Postural Assessment  Cervical Assessment Cervical Assessment: Within Functional Limits Thoracic Assessment Thoracic Assessment: Within Functional  Limits Lumbar Assessment Lumbar Assessment: Within Functional Limits Postural Control Postural Control: Within Functional Limits  Balance Balance Balance Assessed: Yes Standardized Balance Assessment Standardized Balance Assessment: Berg Balance Test Berg Balance Test Sit to Stand: Able to stand without using hands and stabilize independently Standing Unsupported: Able to stand safely 2 minutes Sitting with Back Unsupported but Feet Supported on Floor or Stool: Able to sit safely and securely 2 minutes Stand to Sit: Sits safely with minimal use of hands Transfers: Able to transfer safely, minor use of hands Standing Unsupported with Eyes Closed: Able to stand 10 seconds safely Standing Ubsupported with Feet Together: Able to  place feet together independently and stand 1 minute safely From Standing, Reach Forward with Outstretched Arm: Can reach confidently >25 cm (10") From Standing Position, Pick up Object from Floor: Able to pick up shoe safely and easily From Standing Position, Turn to Look Behind Over each Shoulder: Looks behind from both sides and weight shifts well Turn 360 Degrees: Able to turn 360 degrees safely in 4 seconds or less Standing Unsupported, Alternately Place Feet on Step/Stool: Able to stand independently and safely and complete 8 steps in 20 seconds Standing Unsupported, One Foot in Front: Able to place foot tandem independently and hold 30 seconds Standing on One Leg: Able to lift leg independently and hold > 10 seconds Total Score: 56   Patient demonstrates increased fall risk as noted by score of   56/56 on Berg Balance Scale.  (<36= high risk for falls, close to 100%; 37-45 significant >80%; 46-51 moderate >50%; 52-55 lower >25%)  Extremity Assessment  RLE Assessment RLE Assessment: Within Functional Limits General Strength Comments: grossly 4+/5 LLE Assessment LLE Assessment: Within Functional Limits General Strength Comments: Grossly 5/5  Skilled  intervention: Pt greeted supine in bed, conversing on the phone and required redirection for social cues to engage in therapy session. Supine<>sit mod I with bed features. She donned tennis shoes indep via figure-4 technique while seated EOB. Sit<>stand indep with no AD from EOB. Ambulated indep with no AD from her room to ortho gym, ~320f. No knee buckling or LOB and avoided objects appropriately on R side. Completed BERG balance test as outlined above. She scored 56/56 which is a 14 point improvement since date of evaluation. Ambulated around the hospital, down the atrium floor and assessed patient's ability to perform community ambulation without controlled environment. She was able to safety navigate her environment indep with no AD. Returned to rehab floor and performed NMR with BITS system. She performed word recall with visual presentation only. At first she was able to recall only x5 words in a row prior to errors but when provided strategies for naming words out loud, she was able to improve to 8 words in a row prior to errors. She then performed higher complexity sequencing on BITS with 1-A, 2-B, 3-C, 4-D, etc.. but she struggled with this and had difficulty remember letter/number combination, requiring mod cues for completing. She ambulated back to her room indep and she ended session seated EOB with MD present for morning rounds. All questions and concerns addressed from patient from PT standpoint. Pt is excited regarding tomorrow's DC.   Nikolaus Pienta P Xyler Terpening PT, DPT 03/04/2020, 7:38 AM

## 2020-03-04 NOTE — Progress Notes (Signed)
Speech Language Pathology Discharge Summary  Patient Details  Name: Jill Clarke MRN: [id number] Date of Birth: 1986/01/01  Today's Date: 03/04/2020 SLP Individual Time: 9432-7614 SLP Individual Time Calculation (min): 42 min   Skilled Therapeutic Interventions:  Pt was seen for skilled ST targeting cognitive goals and education with pt. SLP facilitated session with a complex medication management task. Pt used a list of her current medications to organize a BID pill box with Supervision A verbal and visual cues for complex problem solving and redirection to appropriately atted to task. She continues to report changes in memory that are concerning to her. Although no family present, education provided to pt regarding recommendation for 24/7 supervision and follow up OP ST to maximize her cognitive rehabilitation and functional independence. Also discussed compensatory strategies for short term memory deficits, and handout provided to support conversation.       Patient has met 4 of 4 long term goals.  Patient to discharge at overall Supervision level.  Reasons goals not met: n/a   Clinical Impression/Discharge Summary:   Pt made functional gains and met 4 out of 4 long term goals this admission. Pt currently requires Supervision assist for complex tasks and will require 24/7 supervision at discharge due to mild cognitive impairments primarily in attention, short term memory, anticipatory awareness, and problem solving. Pt has demonstrated improved use of compensatory memory strategies and carryover of new information learned in CIR, problem solving, and emergent awareness. However, given cognitive deficits still present, recommend pt continue to receive skilled ST services upon discharge with a focus on higher level memory, attention, and awareness tasks. No family was present for ST sessions, however pt education is complete at this time.   Care Partner:  Caregiver Able to Provide  Assistance: Yes  Type of Caregiver Assistance: Physical;Cognitive  Recommendation:  24 hour supervision/assistance;Outpatient SLP  Rationale for SLP Follow Up: Maximize cognitive function and independence;Reduce caregiver burden   Equipment: none   Reasons for discharge: Discharged from hospital   Patient/Family Agrees with Progress Made and Goals Achieved: Yes    Arbutus Leas 03/04/2020, 7:28 AM

## 2020-03-04 NOTE — Progress Notes (Signed)
Occupational Therapy Discharge Summary  Patient Details  Name: Jill Clarke MRN: [id number] Date of Birth: 1985/10/29  Today's Date: 03/04/2020 OT Individual Time: 1100-1200 OT Individual Time Calculation (min): 60 min    Patient has met 8 of 8 long term goals due to improved balance, ability to compensate for deficits, functional use of  RIGHT upper extremity, improved attention, improved awareness and improved coordination.  Patient to discharge at overall Modified Independent level.  Patient's care partner is independent to provide the necessary cognitive assistance at discharge.     Recommendation:  Patient does not require further skilled OT services at this time.  Equipment: No equipment provided  Reasons for discharge: treatment goals met and discharge from hospital   Skilled intervention:  First session: Pt sitting up EOB, no c/o pain, agreeable to OT session.  Pt initially attempting to ambulate with therapist while talking on phone requiring VCs for redirection and educated pt on safety hazard due to distraction impairing pts ability to focus on attending to right side.  Pt reports good understanding. Pt ambulated to ADL suite bathroom, approximately 200 feet without AD with min VCs needed to implement visual scanning to the right for obstacle maneuvering.  Pt completed bathing and dressing tasks and tub transfer with mod I overall.  Pt ambulated back to room requiring mod VCs to avoid obstacles on right side.  Pt sitting EOB at end of session, call bell in reach, bed alarm on.  Second session:  Pt asleep in bed, requesting to defer OT session due to needing "to rest".  Pt reports no questions or concerns for OT at this time.  Missed treatment time: 30 minutes.   Patient/family agrees with progress made and goals achieved: Yes  OT Discharge Precautions/Restrictions  Precautions Precautions: Fall Precaution Comments: R inattention Restrictions Weight Bearing  Restrictions: No Pain Pain Assessment Pain Scale: 0-10 Pain Score: 0-No pain ADL ADL Eating: Modified independent Where Assessed-Eating: Edge of bed Grooming: Independent Where Assessed-Grooming: Standing at sink Upper Body Bathing: Independent Where Assessed-Upper Body Bathing: Shower Lower Body Bathing: Modified independent Where Assessed-Lower Body Bathing: Shower Upper Body Dressing: Independent Where Assessed-Upper Body Dressing: Chair Lower Body Dressing: Modified independent Where Assessed-Lower Body Dressing: Chair Toileting: Independent Where Assessed-Toileting: Glass blower/designer: Programmer, applications Method: Human resources officer: Modified independent Clinical cytogeneticist Method: Optometrist: Civil engineer, contracting with back Social research officer, government: Close supervision Social research officer, government Method: Heritage manager: Civil engineer, contracting with back Vision Baseline Vision/History: No visual deficits Patient Visual Report: No change from baseline Vision Assessment?: Yes Eye Alignment: Within Functional Limits Tracking/Visual Pursuits: Able to track stimulus in all quads without difficulty Saccades: Within functional limits Perception  Perception: Impaired Inattention/Neglect: Does not attend to right visual field Praxis Praxis: Intact Praxis Impairment Details: Motor planning;Ideation Cognition Overall Cognitive Status: Impaired/Different from baseline Arousal/Alertness: Awake/alert Orientation Level: Oriented X4 Attention: Focused;Sustained;Selective Focused Attention: Appears intact Sustained Attention: Impaired Sustained Attention Impairment: Functional basic;Functional complex Selective Attention: Impaired Selective Attention Impairment: Functional basic;Functional complex Memory: Impaired Memory Impairment: Decreased short term memory;Decreased recall of new information Decreased Long Term Memory: Functional  complex;Functional basic Decreased Short Term Memory: Functional basic;Functional complex Awareness: Impaired Awareness Impairment: Anticipatory impairment Problem Solving: Impaired Problem Solving Impairment: Functional basic;Functional complex Executive Function: Self Monitoring;Self Correcting Self Monitoring: Impaired Self Monitoring Impairment: Functional basic Self Correcting: Impaired Self Correcting Impairment: Functional basic Safety/Judgment: Appears intact Sensation   Motor  Motor Motor: Hemiplegia Motor - Skilled Clinical Observations: Mild R hemi Mobility  Bed Mobility Bed Mobility: Rolling Right;Supine to Sit;Sit to Supine Rolling Right: Independent Rolling Left: Independent Supine to Sit: Independent Sit to Supine: Independent Transfers Sit to Stand: Independent Stand to Sit: Independent  Trunk/Postural Assessment  Cervical Assessment Cervical Assessment: Within Functional Limits Thoracic Assessment Thoracic Assessment: Within Functional Limits Lumbar Assessment Lumbar Assessment: Within Functional Limits Postural Control Postural Control: Within Functional Limits  Balance Balance Balance Assessed: Yes Static Sitting Balance Static Sitting - Balance Support: Feet supported Static Sitting - Level of Assistance: 7: Independent Dynamic Sitting Balance Dynamic Sitting - Balance Support: Feet supported;During functional activity Dynamic Sitting - Level of Assistance: 7: Independent Static Standing Balance Static Standing - Balance Support: During functional activity Static Standing - Level of Assistance: 7: Independent Dynamic Standing Balance Dynamic Standing - Balance Support: No upper extremity supported Dynamic Standing - Level of Assistance: 6: Modified independent (Device/Increase time) Extremity/Trunk Assessment RUE Assessment General Strength Comments: AROM WFL, strength 4/5 LUE Assessment LUE Assessment: Within Functional Limits   Ezekiel Slocumb 03/04/2020, 12:43 PM

## 2020-03-04 NOTE — Progress Notes (Signed)
[place] PHYSICAL MEDICINE & REHABILITATION PROGRESS NOTE  Subjective/Complaints: Patient seen sitting up at the edge of her bed this morning.  Good sitting balance noted.  She states she slept well overnight.  She is very excited about plans for discharge tomorrow.  ROS: Denies CP, SOB, N/V/D  Objective: Vital Signs: Blood pressure 106/74, pulse 95, temperature 98 F (36.7 C), resp. rate 15, height 5\' 5"  (1.651 m), last menstrual period 02/03/2020, SpO2 100 %. No results found. No results for input(s): WBC, HGB, HCT, PLT in the last 72 hours. No results for input(s): NA, K, CL, CO2, GLUCOSE, BUN, CREATININE, CALCIUM in the last 72 hours.  Intake/Output Summary (Last 24 hours) at 03/04/2020 1250 Last data filed at 03/04/2020 1100 Gross per 24 hour  Intake 880 ml  Output 2 ml  Net 878 ml        Physical Exam: BP 106/74 (BP Location: Left Arm)   Pulse 95   Temp 98 F (36.7 C)   Resp 15   Ht 5\' 5"  (1.651 m)   LMP 02/03/2020 (Approximate)   SpO2 100%   BMI 25.46 kg/m  Constitutional: No distress . Vital signs reviewed. HENT: Normocephalic.  Atraumatic. Eyes: EOMI. No discharge. Cardiovascular: No JVD.  RRR. Respiratory: Normal effort.  No stridor.  Bilateral clear to auscultation. GI: Non-distended.  BS +. Skin: Warm and dry.  Intact. Psych: Normal mood.  Normal behavior. Musc: No edema in extremities.  No tenderness in extremities. Neuro; alert Motor: RUE: 5/5 proximal to distal LUE: 5/5 proximal to distal RLE: HF 5/5, KE 4+/5, ADF 4/5 LLE: HF: 4+/5, KE, ADF 4/5  Assessment/Plan: 1. Functional deficits which require 3+ hours per day of interdisciplinary therapy in a comprehensive inpatient rehab setting.  Physiatrist is providing close team supervision and 24 hour management of active medical problems listed below.  Physiatrist and rehab team continue to assess barriers to discharge/monitor patient progress toward functional and medical goals   Care  Tool:  Bathing    Body parts bathed by patient: Right arm,Left upper leg,Left arm,Right lower leg,Chest,Abdomen,Left lower leg,Face,Front perineal area,Buttocks,Right upper leg         Bathing assist Assist Level: Supervision/Verbal cueing     Upper Body Dressing/Undressing Upper body dressing   What is the patient wearing?: Pull over shirt,Bra    Upper body assist Assist Level: Supervision/Verbal cueing    Lower Body Dressing/Undressing Lower body dressing      What is the patient wearing?: Underwear/pull up,Pants     Lower body assist Assist for lower body dressing: Supervision/Verbal cueing     Toileting Toileting    Toileting assist Assist for toileting: Independent     Transfers Chair/bed transfer  Transfers assist     Chair/bed transfer assist level: Independent     Locomotion Ambulation   Ambulation assist      Assist level: Independent Assistive device: No Device Max distance: 1000   Walk 10 feet activity   Assist     Assist level: Independent Assistive device: No Device   Walk 50 feet activity   Assist    Assist level: Independent Assistive device: No Device    Walk 150 feet activity   Assist    Assist level: Independent Assistive device: No Device    Walk 10 feet on uneven surface  activity   Assist     Assist level: Independent     Wheelchair     Assist Will patient use wheelchair at discharge?: No   Wheelchair  activity did not occur: N/A         Wheelchair 50 feet with 2 turns activity    Assist    Wheelchair 50 feet with 2 turns activity did not occur: N/A       Wheelchair 150 feet activity     Assist  Wheelchair 150 feet activity did not occur: N/A        Medical Problem List and Plan: 1.  HA, decline in ability to carry out ADLs as well as mobility secondary to bilateral SAH, watershed infarcts, and venous thrombosis  Continue CIR, patient and family education 2.   Antithrombotics: -DVT/anticoagulation:  Pharmaceutical: Pradexa             -antiplatelet therapy: N/A 3. Headaches/Pain Management: Tylenol prn.  4. Mood: LCSW to follow for evaluation and support.              -antipsychotic agents: N/A 5. Neuropsych: This patient is not fully capable of making decisions on her own behalf. 6. Skin/Wound Care: Routine pressure relief measures.  7. Fluids/Electrolytes/Nutrition: Monitor I/Os .             BMP WNL on 2/7 8. 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    No breakthrough seizures from admission-2/10 9. ABLA: Monitor              Transfused with 1 unit on 01/24  Hemoglobin 9.7 on 2/7 10. Mastoiditis: Complete course of Unasyn 11. Hypotension:              Not on any prescription medications at present  Relatively soft on 2/10  Monitor with increased exertion 12.  Lethargy: On amantadine for attention/activation. Keppra decreased to 500 mg on 02/24/20  Amantadine DC'd on 2/8, continue to monitor, no issues 13. Hypoalbuminemia: 3.1 on 2/7- High protein foods.    LOS: 5 days A FACE TO FACE EVALUATION WAS PERFORMED  Ankit Lorie Phenix 03/04/2020, 12:50 PM

## 2020-03-04 NOTE — Progress Notes (Incomplete)
Inpatient Rehabilitation Care Coordinator Discharge Note  The overall goal for the admission was met for:   Discharge location: Yes-HOME WITH MOM WHO ALSO CARES FOR HER TWO CHILDREN 7 & 8  Length of Stay: Yes-6 DAYS  Discharge activity level: Yes-SUPERVISION LEVEL  Home/community participation: Yes  Services provided included: MD, RD, PT, OT, SLP, RN, CM, Pharmacy, Neuropsych and SW  Financial Services: Private Insurance: [employer]. Pt insurance has been terminated and now can receive Match program for medication assistance. TOC pharmacy filling scripts  Choices offered to/list presented to:YES  Follow-up services arranged: Outpatient: [facility] -PT & SP WILL CALL TO SET UP APPOINTMENTS  Comments (or additional information):PT DID WELL AND PROGRESSED QUICKLY. MOM CAN PROVIDE SUPERVISION LEVEL. NO EQUIPMENT NEEDS.GAVE PT INFORMATION REGARDING OBTAINING PCP-DR CARMEN ROBINSON-[facility] WILL NEED TO GO TO OFFICE TO COMPLETE PAPERWORK AND MAKE AN APPOINTMENT FOR FOLLOW FOR PCP AND ASSISTANCE WITH MEDICATIONS.  Patient/Family verbalized understanding of follow-up arrangements: Yes  Individual responsible for coordination of the follow-up plan: MARGARET-MOM [phone]  Confirmed correct DME delivered: Elease Hashimoto 03/04/2020    Elease Hashimoto

## 2020-03-05 ENCOUNTER — Other Ambulatory Visit: Payer: Self-pay | Admitting: Physical Medicine and Rehabilitation

## 2020-03-05 MED ORDER — LEVETIRACETAM 500 MG PO TABS: 500.0000 mg | ORAL_TABLET | Freq: Two times a day (BID) | ORAL | 0 refills | Status: DC

## 2020-03-05 MED ORDER — POLYETHYLENE GLYCOL 3350 17 G PO PACK: 17.0000 g | PACK | Freq: Every day | ORAL | 0 refills | Status: DC

## 2020-03-05 MED ORDER — ATORVASTATIN CALCIUM 40 MG PO TABS: 40.0000 mg | ORAL_TABLET | Freq: Every day | ORAL | 0 refills | Status: DC

## 2020-03-05 MED ORDER — PANTOPRAZOLE SODIUM 40 MG PO TBEC: 40.0000 mg | DELAYED_RELEASE_TABLET | Freq: Every day | ORAL | 0 refills | Status: DC

## 2020-03-05 MED ORDER — DOCUSATE SODIUM 100 MG PO CAPS
100.0000 mg | ORAL_CAPSULE | Freq: Two times a day (BID) | ORAL | 0 refills | Status: DC
Start: 2020-03-05 — End: 2020-10-13

## 2020-03-05 MED FILL — POLYETHYLENE GLYCOL 3350 PO: 17 | 30 days supply | Qty: 510 | Fill #0

## 2020-03-05 MED FILL — DOCUSATE SODIUM 100 MG CAPS: 100 | 30 days supply | Qty: 60 | Fill #0

## 2020-03-05 MED FILL — PANTOPRAZOLE SOD DR 40 MG T: 40 | 30 days supply | Qty: 30 | Fill #0

## 2020-03-05 MED FILL — levETIRAcetam 500 MG TABS: 500 | 30 days supply | Qty: 60 | Fill #0

## 2020-03-05 MED FILL — ATORVASTATIN CALCIUM 40 MG: 40 | 30 days supply | Qty: 30 | Fill #0

## 2020-03-05 NOTE — Progress Notes (Signed)
[place] PHYSICAL MEDICINE & REHABILITATION PROGRESS NOTE  Subjective/Complaints: Patient seen sitting up in her chair this morning with a bed alarm going off.  She states she does not know why [place] bed alarm is going off.  I informed her because she is not supposed to transfer without notifying staff.  She is very excited about discharge today.  ROS: Denies CP, SOB, N/V/D  Objective: Vital Signs: Blood pressure 106/75, pulse 91, temperature 98.1 F (36.7 C), temperature source Oral, resp. rate 16, height 5\' 5"  (1.651 m), SpO2 99 %. No results found. No results for input(s): WBC, HGB, HCT, PLT in the last 72 hours. No results for input(s): NA, K, CL, CO2, GLUCOSE, BUN, CREATININE, CALCIUM in the last 72 hours.  Intake/Output Summary (Last 24 hours) at 03/05/2020 0856 Last data filed at 03/04/2020 1743 Gross per 24 hour  Intake 400 ml  Output --  Net 400 ml        Physical Exam: BP 106/75 (BP Location: Left Arm)   Pulse 91   Temp 98.1 F (36.7 C) (Oral)   Resp 16   Ht 5\' 5"  (1.651 m)   SpO2 99%   BMI 25.46 kg/m  Constitutional: No distress . Vital signs reviewed. HENT: Normocephalic.  Atraumatic. Eyes: EOMI. No discharge. Cardiovascular: No JVD.  RRR. Respiratory: Normal effort.  No stridor.  Bilateral clear to auscultation. GI: Non-distended.  BS +. Skin: Warm and dry.  Intact. Psych: Normal mood.  Normal behavior. Musc: No edema in extremities.  No tenderness in extremities. Neuro; Alert Motor: RUE: 5/5 proximal to distal LUE: 5/5 proximal to distal RLE: HF 5/5, KE 4+/5, ADF 4/5, improving LLE: HF: 4+/5, KE, ADF 4/5, improving  Assessment/Plan: 1. Functional deficits which require 3+ hours per day of interdisciplinary therapy in a comprehensive inpatient rehab setting.  Physiatrist is providing close team supervision and 24 hour management of active medical problems listed below.  Physiatrist and rehab team continue to assess barriers to discharge/monitor  patient progress toward functional and medical goals   Care Tool:  Bathing    Body parts bathed by patient: Right arm,Left upper leg,Left arm,Right lower leg,Chest,Abdomen,Left lower leg,Face,Front perineal area,Buttocks,Right upper leg         Bathing assist Assist Level: Supervision/Verbal cueing     Upper Body Dressing/Undressing Upper body dressing   What is the patient wearing?: Pull over shirt,Bra    Upper body assist Assist Level: Supervision/Verbal cueing    Lower Body Dressing/Undressing Lower body dressing      What is the patient wearing?: Underwear/pull up,Pants     Lower body assist Assist for lower body dressing: Supervision/Verbal cueing     Toileting Toileting    Toileting assist Assist for toileting: Independent     Transfers Chair/bed transfer  Transfers assist     Chair/bed transfer assist level: Independent     Locomotion Ambulation   Ambulation assist      Assist level: Independent Assistive device: No Device Max distance: 1000   Walk 10 feet activity   Assist     Assist level: Independent Assistive device: No Device   Walk 50 feet activity   Assist    Assist level: Independent Assistive device: No Device    Walk 150 feet activity   Assist    Assist level: Independent Assistive device: No Device    Walk 10 feet on uneven surface  activity   Assist     Assist level: Independent     Wheelchair  Assist Will patient use wheelchair at discharge?: No   Wheelchair activity did not occur: N/A         Wheelchair 50 feet with 2 turns activity    Assist    Wheelchair 50 feet with 2 turns activity did not occur: N/A       Wheelchair 150 feet activity     Assist  Wheelchair 150 feet activity did not occur: N/A        Medical Problem List and Plan: 1.  HA, decline in ability to carry out ADLs as well as mobility secondary to bilateral SAH, watershed infarcts, and venous  thrombosis  DC today  Will see patient for hospital follow-up in 1 month post-discharge 2.  Antithrombotics: -DVT/anticoagulation:  Pharmaceutical: Pradexa             -antiplatelet therapy: N/A 3. Headaches/Pain Management: Tylenol prn.  4. Mood: LCSW to follow for evaluation and support.              -antipsychotic agents: N/A 5. Neuropsych: This patient is not fully capable of making decisions on her own behalf. 6. Skin/Wound Care: Routine pressure relief measures.  7. Fluids/Electrolytes/Nutrition: Monitor I/Os .             BMP WNL on 2/7 8. 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    No breakthrough seizures from admission - 2/11 9. ABLA: Monitor              Transfused with 1 unit on 01/24  Hemoglobin 9.7 on 2/7 10. Mastoiditis: Complete course of Unasyn 11. Hypotension:              Not on any prescription medications at present  Relatively soft on 2/11  Monitor with increased exertion 12.  Lethargy: On amantadine for attention/activation. Keppra decreased to 500 mg on 02/24/20  Amantadine DC'd on 2/8, continue to monitor, no issues 13. Hypoalbuminemia: 3.1 on 2/7- High protein foods.   > 30 minutes spent in total in discharge planning between myself and PA regarding aforementioned, as well discussion regarding DME equipment, follow-up appointments, follow-up therapies, discharge medications, discharge recommendations, answering questions.  Please see discharge summary as well.  LOS: 6 days A FACE TO FACE EVALUATION WAS PERFORMED  Ellionna Buckbee Lorie Phenix 03/05/2020, 8:56 AM

## 2020-03-05 NOTE — Progress Notes (Signed)
Patient ID: Jill Clarke, female   DOB: 1985-04-30, 35 y.o.   MRN: [id number] Met with the patient to review role of the nurse CM and address educational needs. Reviewed collaboration with the SW to facilitate preparation for discharge. Discussed risk factors for secondary stroke including  HLD; LDL 108 and low HDL along with DAPT + Pradaxa. Patient with T2 DM although A1C is 4.9. Patient reported going "vegan" for over a year. Reviewed Albumin level of 3.1 and protein source options. Continue to follow along to discharge and address educational needs. No questions noted at present. Margarito Liner

## 2020-03-05 NOTE — Discharge Summary (Addendum)
Physician Discharge Summary  Patient ID: Jill Clarke MRN: [id number] DOB/AGE: [birth year] 35 y.o.  Admit date: 02/28/2020 Discharge date: 03/05/2020  Discharge Diagnoses:  Principal Problem:   Cerebral thrombosis with cerebral infarction Active Problems:   Cerebral vein thrombosis   Acute blood loss anemia   Hypotension   Neurologic neglect syndrome   Discharged Condition: stable   Significant Diagnostic Studies: N/A   Labs:  Basic Metabolic Panel: BMP Latest Ref Rng & Units 03/01/2020 02/28/2020 02/26/2020  Glucose 70 - 99 mg/dL 98 114(H) 106(H)  BUN 6 - 20 mg/dL 10 6 <5(L)  Creatinine 0.44 - 1.00 mg/dL 0.56 0.45 0.43(L)  Sodium 135 - 145 mmol/L 137 139 139  Potassium 3.5 - 5.1 mmol/L 3.9 3.6 3.5  Chloride 98 - 111 mmol/L 100 102 105  CO2 22 - 32 mmol/L 28 28 27   Calcium 8.9 - 10.3 mg/dL 9.6 9.2 8.9    CBC: CBC Latest Ref Rng & Units 03/01/2020 02/28/2020 02/27/2020  WBC 4.0 - 10.5 K/uL 6.2 6.0 7.4  Hemoglobin 12.0 - 15.0 g/dL 9.7(L) 8.4(L) 8.8(L)  Hematocrit 36.0 - 46.0 % 31.6(L) 27.3(L) 28.4(L)  Platelets 150 - 400 K/uL 554(H) 462(H) 504(H)    CBG: Recent Labs  Lab 02/27/20 2025 02/28/20 0631 02/28/20 0740 02/28/20 1138  GLUCAP 166* 94 94 99   Vitals:  Vitals with BMI 03/05/2020 03/04/2020 03/04/2020  Height - - -  Weight - - -  BMI - - -  Systolic 017 793 903  Diastolic 75 75 77  Pulse 91 107 102     Brief HPI:   Jill Clarke is a 35 y.o. female with T2DM, sickle cell trait who was admitted on 02/12/2020 with 1 week history of nausea vomiting and headaches.  MRI brain done revealing patchy acute infarcts in bilateral cerebral deep watershed white matter and along high left sylvian fissure with focal swelling and small volume of SAH.  MRV brain showed dural venous sinus thrombosis involving superior sagittal sinus into the right jugular vein, break sinus/deep cerebral thrombosis and both veins of [place] were thrombosed.  SAH was felt to be due to  significant thrombosis that she was started on hypertonic saline.  She underwent cerebral angio with revascularization of occluded right and left frontal sinuses and partially of  right sigmoid sinus with improvement in flow with decompression of venous system    She was noted to be somnolent decreased movement on the right and EEG done showing moderate diffuse encephalopathy with delta slowing in bitemporal region.  She was started on Keppra due to concerns of focal motor seizures.  She underwent repeat cerebral angio on 01/23 with repeat revascularization of SSS, straight sinus, vein of [place], ICVs and right transverse sinus.  MRI/MRV brain 01/26 showed acute/subacute left frontoparietal and right CC hemorrhagic venous infarct in bilateral reported she had white matter without change.  IV heparin was transitioned to Pradaxa and Dr. [name] recommended continuing Keppra --dose was decreased to 500 mg twice daily and amantadine was added to help with activation.  She tolerated extubation and was on regular diet.  She was found to have mastoiditis and started on IV antibiotics on 01/23. She continued to be limited by headaches, dizziness, balance deficits with poor attention and tendency to bump into items on right as well as weakness affecting ADLs and mobility.  CIR was recommended due to functional decline.   Hospital Course: [name] was admitted to rehab 02/28/2020 for inpatient therapies to consist of PT,  ST and OT at least three hours five days a week. Past admission physiatrist, therapy team and rehab RN have worked together to provide customized collaborative inpatient rehab.  Her blood pressures were monitored on TID basis and remains relatively soft however patient without any symptoms.  She completed course of Unasyn.  Serial check of CBC shows H&H to be 6 slowly improving.  Check of electrolytes showed BMP/renal status to be within normal limits.  No signs of lethargy noted during his stay and  amantadine was DC'd on 02/08.  She continues on Keppra 500 mg twice daily and has been seizure-free on this dose.  He has made good gains during her rehab stay and supervision is recommended for safety.  During patient's stay in rehab team conferences were held to monitor patient's progress, set goals and discuss barriers to discharge. At admission, patient required min assist with basic ADL tasks and with mobility. Exhibited moderate cognitive deficits affecting memory, attention, problem-solving and awareness. She  has had improvement in activity tolerance, balance, postural control as well as ability to compensate for deficits.  She is able to complete ADL tasks at modified independent level.  She requires supervision for complex tasks and continues to have deficits in attention, short-term memory anticipation.  Awareness of problem solving.  She requires supervision with mobility.   Family education was completed with mother.  Medications refilled by transition of care and patient/mother have been advised on importance of setting up with Dr. Quentin Cornwall for primary care.    Disposition:  Home  Diet: Regular  Special Instructions: 1.  No driving till cleared by neurology. 2.  Will need protein as repeated in 5 to 6 weeks.  Discharge Instructions     Ambulatory referral to Physical Medicine Rehab   Complete by: As directed    4 weeks follow up appointment      Allergies as of 03/05/2020   No Known Allergies      Medication List     TAKE these medications    acetaminophen 325 MG tablet Commonly known as: TYLENOL Take 1-2 tablets (325-650 mg total) by mouth every 4 (four) hours as needed for mild pain.   atorvastatin 40 MG tablet Commonly known as: LIPITOR Take 1 tablet (40 mg total) by mouth daily. Start taking on: March 06, 2020   dabigatran 150 MG Caps capsule Commonly known as: PRADAXA Take 1 capsule (150 mg total) by mouth every 12 (twelve) hours.   docusate sodium  100 MG capsule Commonly known as: COLACE Take 1 capsule (100 mg total) by mouth 2 (two) times daily. Notes to patient: For constipation   levETIRAcetam 500 MG tablet Commonly known as: KEPPRA Take 1 tablet (500 mg total) by mouth 2 (two) times daily. Notes to patient: To prevent seizures   pantoprazole 40 MG tablet Commonly known as: PROTONIX Take 1 tablet (40 mg total) by mouth daily. Start taking on: March 06, 2020 Notes to patient: For reflux/indigestion   polyethylene glycol 17 g packet Commonly known as: MIRALAX / GLYCOLAX Take 17 g by mouth daily. Start taking on: March 06, 2020 Notes to patient: For constipation        Follow-up Information     Drue Flirt, MD Follow up.   Specialty: Family Medicine Why: Needs to go into office sign consent forms and provide picture ID, will make an appointment for you then. She accepts [facility] Contact information: [address] [place] [address] [phone]  Jamse Arn, MD Follow up.   Specialty: Physical Medicine and Rehabilitation Why: Office will call you with follow up appointment Contact information: [place] [address] [phone]         [facility]. Call.   Why: for stroke follow up Contact information: [address]     [place] [address] [phone]                Signed: Bary Leriche 03/05/2020, 7:37 PM Patient was seen, face-face, and physical exam performed by me on day of discharge, greater than 30 minutes of total time spent.. Please see progress note from day of discharge as well.  Delice Lesch, MD, ABPMR

## 2020-03-05 NOTE — Progress Notes (Signed)
PA Pam in with pt/family to discuss discharge instructions. Belongings gathered. Pt/family in agreement with no further concerns. Pt left per wheelchair to private vehicle. No complications noted.  Sheela Stack, LPN

## 2020-03-09 ENCOUNTER — Telehealth (HOSPITAL_COMMUNITY): Payer: Self-pay

## 2020-03-09 NOTE — Telephone Encounter (Signed)
Pharmacy Transitions of Care Follow-up Telephone Call  Date of discharge: 03/04/20 Discharge Diagnosis: cerebral thrombosis with cerebral infarction  How have you been since you were released from the hospital? Currently with her mother and father. She is doing well but she's tired. Knows s/sx of bleeding to watch out for.   Medication changes made at discharge: yes Medication changes obtained and verified? yes    Medication Accessibility:  . Home Pharmacy: patient doesn't have a home pharmacy. She is currently uninsured and isn't sure if she will continue to take the medication. She was advised to call [facility] so she could make an appointment with them for a follow up and receive advice on patient assistance.    Medication Review:  Pradaxa 150mg  BID start 03/04/20 - Discussed importance of taking medication around the same time everyday   - Advised patient of medications to avoid (NSAIDs, ASA)  - Educated that Tylenol (acetaminophen) will be the preferred analgesic to prevent risk of bleeding  - Emphasized importance of monitoring for signs and symptoms of bleeding (abnormal bruising, prolonged bleeding, nose bleeds, bleeding from gums, discolored urine, black tarry stools)  - Advised patient to alert all providers of anticoagulation therapy prior to starting a new medication or having a procedure   Follow-up Appointments:  Currently does not have follow up scheduled. Was advised to make appointment at [facility] for follow up.   If their condition worsens, is the pt aware to call PCP or go to the Emergency Dept.? yes  Final Patient Assessment: Patient is doing well. Does not have refills or follow up scheduled but was directed to [facility] for next steps.

## 2020-03-15 ENCOUNTER — Ambulatory Visit: Payer: 59

## 2020-03-15 ENCOUNTER — Ambulatory Visit: Payer: 59 | Attending: Physical Medicine & Rehabilitation

## 2020-03-29 ENCOUNTER — Ambulatory Visit: Payer: 59

## 2020-03-29 ENCOUNTER — Other Ambulatory Visit: Payer: Self-pay

## 2020-03-29 ENCOUNTER — Encounter: Payer: Self-pay | Admitting: Physical Therapy

## 2020-03-29 ENCOUNTER — Ambulatory Visit: Payer: 59 | Attending: Physical Medicine & Rehabilitation | Admitting: Physical Therapy

## 2020-03-29 VITALS — BP 108/84 | HR 92

## 2020-03-29 DIAGNOSIS — R2689 Other abnormalities of gait and mobility: Secondary | ICD-10-CM | POA: Diagnosis not present

## 2020-03-29 DIAGNOSIS — R41841 Cognitive communication deficit: Secondary | ICD-10-CM

## 2020-03-29 NOTE — Therapy (Signed)
Jill Clarke [address] [place], [place], [address] Phone: [phone]   Fax:  [phone]  Physical Therapy Evaluation  Jill Clarke Details  Name: Jill Clarke MRN: [id number] Date of Birth: 1985-12-31 Referring Provider (PT): Delice Lesch   Encounter Date: 03/29/2020   PT End of Session - 03/29/20 1444    Visit Number 1    Number of Visits 1    Authorization Type [employer]    PT Start Time 1406    PT Stop Time 1438    PT Time Calculation (min) 32 min    Activity Tolerance Jill Clarke tolerated treatment well    Behavior During Therapy [facility] for tasks assessed/performed           Past Medical History:  Diagnosis Date  . Asthma    uses inhaler prn  . Diabetes mellitus without complication ([place])   . Sickle cell trait ([place])   . Unspecified high-risk pregnancy 01/27/2011    Past Surgical History:  Procedure Laterality Date  . IR ANGIO INTRA EXTRACRAN SEL COM CAROTID INNOMINATE UNI R MOD SED  02/13/2020  . IR ANGIO INTRA EXTRACRAN SEL COM CAROTID INNOMINATE UNI R MOD SED  02/15/2020  . IR ANGIO INTRA EXTRACRAN SEL INTERNAL CAROTID UNI L MOD SED  02/13/2020  . IR ANGIO VERTEBRAL SEL VERTEBRAL UNI R MOD SED  02/13/2020  . IR CT HEAD LTD  02/13/2020  . IR CT HEAD LTD  02/15/2020  . IR PTA VENOUS EXCEPT DIALYSIS CIRCUIT  02/15/2020  . IR THROMBECT VENO MECH MOD SED  02/13/2020  . IR THROMBECT VENO MECH REPT MOD SED  02/15/2020  . RADIOLOGY WITH ANESTHESIA N/A 02/13/2020   Procedure: IR WITH ANESTHESIA;  Surgeon: Luanne Bras, MD;  Location: [facility];  Service: Radiology;  Laterality: N/A;  . RADIOLOGY WITH ANESTHESIA N/A 02/15/2020   Procedure: IR WITH ANESTHESIA;  Surgeon: Radiologist, Medication, MD;  Location: [facility];  Service: Radiology;  Laterality: N/A;  . TONSILLECTOMY    . WISDOM TOOTH EXTRACTION      Vitals:   03/29/20 1444  BP: 108/84  Pulse: 92      Subjective Assessment - 03/29/20 1410    Subjective  Pt reports before she left hospital, she needed a little more assistance.  Since being home from the hospital, have not had any problems. Notes L sided weakness.  No falls.  Feels back to normal.    Jill Clarke Stated Goals No specific goals    Currently in Pain? No/denies              [facility] PT Assessment - 03/29/20 1413      Assessment   Medical Diagnosis SDH and CVA    Referring Provider (PT) Posey Pronto, Ankit    Onset Date/Surgical Date 02/12/20    Hand Dominance Left      Precautions   Precautions Fall    Precaution Comments No driving, not yet returned to work      Balance Screen   Has the Jill Clarke fallen in the past 6 months No    Has the Jill Clarke had a decrease in activity level because of a fear of falling?  No    Is the Jill Clarke reluctant to leave their home because of a fear of falling?  No      Home Environment   Living Environment Private residence    [place]   Mother is living with her now   Available Help at Discharge Family  Type of [place] to enter    Entrance Stairs-Number of Steps 5-6    Entrance Stairs-Rails Right;Left;Can reach both    Home Layout One level    Home Equipment None      Prior Function   Level of Independence Independent    Vocation Full time employment    Vocation Requirements Worked at distribution center, pulling and carrying parts, walking distances      ROM / Strength   AROM / PROM / Strength Strength      Strength   Overall Strength Within functional limits for tasks performed      Transfers   Transfers Sit to Stand;Stand to Sit    Sit to Stand 6: Modified independent (Device/Increase time);Without upper extremity assist;From chair/3-in-1    Five time sit to stand comments  4.68    Stand to Sit Without upper extremity assist;To chair/3-in-1      Ambulation/Gait   Ambulation/Gait Yes    Ambulation/Gait Assistance 7: Independent    Ambulation Distance (Feet) 200 Feet    Assistive device  None    Gait Pattern Within Functional Limits    Ambulation Surface Level;Indoor    Gait velocity 10.79 sec = 3.04 ft/sec      Standardized Balance Assessment   Standardized Balance Assessment Timed Up and Go Test      Timed Up and Go Test   Normal TUG (seconds) 11.25    TUG Comments Scores >13.5 sec indicate increased fall risk.      High Level Balance   High Level Balance Comments EO and EC solid surface x 30 sec;  EO and EC foam surface x 30 sec, No sway and No LOB.      Functional Gait  Assessment   Gait assessed  Yes    Gait Level Surface Walks 20 ft in less than 7 sec but greater than 5.5 sec, uses assistive device, slower speed, mild gait deviations, or deviates 6-10 in outside of the 12 in walkway width.   6.72   Change in Gait Speed Able to smoothly change walking speed without loss of balance or gait deviation. Deviate no more than 6 in outside of the 12 in walkway width.    Gait with Horizontal Head Turns Performs head turns smoothly with slight change in gait velocity (eg, minor disruption to smooth gait path), deviates 6-10 in outside 12 in walkway width, or uses an assistive device.   7.69   Gait with Vertical Head Turns Performs head turns with no change in gait. Deviates no more than 6 in outside 12 in walkway width.   7.06   Gait and Pivot Turn Pivot turns safely within 3 sec and stops quickly with no loss of balance.    Step Over Obstacle Is able to step over 2 stacked shoe boxes taped together (9 in total height) without changing gait speed. No evidence of imbalance.    Gait with Narrow Base of Support Is able to ambulate for 10 steps heel to toe with no staggering.    Gait with Eyes Closed Walks 20 ft, slow speed, abnormal gait pattern, evidence for imbalance, deviates 10-15 in outside 12 in walkway width. Requires more than 9 sec to ambulate 20 ft.   10.53   Ambulating Backwards Walks 20 ft, no assistive devices, good speed, no evidence for imbalance, normal gait     Steps Alternating feet, no rail.    Total Score 26  FGA comment: Scores <22/30 indicate increased fall risk.                      Objective measurements completed on examination: See above findings.               PT Education - 03/29/20 1443    Education Details PT eval results, no skilled need for OPPT services at this time    Person(s) Educated Jill Clarke    Methods Explanation    Comprehension Verbalized understanding                       Plan - 03/29/20 1445    Clinical Impression Statement Pt is a 36 y.o. female with a history significant for asthma, T2DM, and sickle cell trait presented to ED 01/1920 for N/V and discharged home. Returned 02/12/20 due to AMS. CT Head revealing a small volume subarachnoid hemorrhage and hyperdense dural sinuses with a filling defect concerning for dural venous thrombosis. 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  Pt was hospitalized, then participated in therapies in [facility] 2/5-2/11/2020, when she was discharged home.  She presents to OPPT for PT evaluation today, reporting she feels back to normal and does not feel she has any balance or gait deficits.  Pt in agreement to proceed with eval.  Objective measures show strength, balance, and gait WFL.  Pt does not appear to be at fall risk per TUG, FGA or gait velocity measures.  Pt is an unlimited community ambulator per gait velocity.  She does not appear to have any skilled PT needs at this time.    Personal Factors and Comorbidities Comorbidity 3+    Comorbidities see above    Stability/Clinical Decision Making Stable/Uncomplicated    Clinical Decision Making Low    PT Frequency One time visit    PT Next Visit Plan Eval only, no further skilled PT needs    Consulted and Agree with Plan of Care Jill Clarke           Jill Clarke will benefit from skilled therapeutic intervention in order to improve the following  deficits and impairments:     Visit Diagnosis: Other abnormalities of gait and mobility     Problem List Jill Clarke Active Problem List   Diagnosis Date Noted  . Neurologic neglect syndrome   . Hypotension   . Cerebral vein thrombosis 02/28/2020  . Hypokalemia 02/28/2020  . Mastoiditis   . Acute blood loss anemia   . Seizures ([place])   . 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 Respiratory failure ([place])   . Acute cerebral venous sinus thrombosis 02/12/2020  . Unspecified contraceptive management 10/02/2011  . Unspecified high-risk pregnancy 01/27/2011  . Low birth weight 01/23/2011  . Asthma 01/23/2011  . Short interval between pregnancies complicating pregnancy, antepartum 01/23/2011  . Nausea and vomiting in pregnancy 12/28/2010    Mikolaj Woolstenhulme W. 03/29/2020, 2:51 PM  Frazier Butt., PT   [facility] [address] [place], [place], [address] Phone: [phone]   Fax:  [phone]  Name: YAILIN BIEDERMAN MRN: [id number] Date of Birth: 03-Jul-1985

## 2020-03-29 NOTE — Therapy (Signed)
[facility] [address] [place], [place], [address] Phone: [phone]   Fax:  [phone]  Speech Language Pathology Evaluation  Patient Details  Name: Jill Clarke MRN: [id number] Date of Birth: Mar 27, 1985 Referring Provider (SLP): Jamse Arn MD   Encounter Date: 03/29/2020   End of Session - 03/29/20 1407    Visit Number 1    Number of Visits 1    SLP Start Time 2440    SLP Stop Time  1400    SLP Time Calculation (min) 45 min    Activity Tolerance Patient tolerated treatment well           Past Medical History:  Diagnosis Date  . Asthma    uses inhaler prn  . Diabetes mellitus without complication ([place])   . Sickle cell trait ([place])   . Unspecified high-risk pregnancy 01/27/2011    Past Surgical History:  Procedure Laterality Date  . IR ANGIO INTRA EXTRACRAN SEL COM CAROTID INNOMINATE UNI R MOD SED  02/13/2020  . IR ANGIO INTRA EXTRACRAN SEL COM CAROTID INNOMINATE UNI R MOD SED  02/15/2020  . IR ANGIO INTRA EXTRACRAN SEL INTERNAL CAROTID UNI L MOD SED  02/13/2020  . IR ANGIO VERTEBRAL SEL VERTEBRAL UNI R MOD SED  02/13/2020  . IR CT HEAD LTD  02/13/2020  . IR CT HEAD LTD  02/15/2020  . IR PTA VENOUS EXCEPT DIALYSIS CIRCUIT  02/15/2020  . IR THROMBECT VENO MECH MOD SED  02/13/2020  . IR THROMBECT VENO MECH REPT MOD SED  02/15/2020  . RADIOLOGY WITH ANESTHESIA N/A 02/13/2020   Procedure: IR WITH ANESTHESIA;  Surgeon: Luanne Bras, MD;  Location: [facility];  Service: Radiology;  Laterality: N/A;  . RADIOLOGY WITH ANESTHESIA N/A 02/15/2020   Procedure: IR WITH ANESTHESIA;  Surgeon: Radiologist, Medication, MD;  Location: [facility];  Service: Radiology;  Laterality: N/A;  . TONSILLECTOMY    . WISDOM TOOTH EXTRACTION      There were no vitals filed for this visit.       SLP Evaluation [facility] - 03/29/20 1304      SLP Visit Information   SLP Received On 03/04/20    Referring Provider (SLP) Jamse Arn MD    Onset Date 02/12/20    Medical Diagnosis CVA & SDH      Subjective   Subjective "I don't really know why I'm here. I am back to normal"    Patient/Family Stated Goal Pt elected to defer ST intervention at this time      Pain Assessment   Pain Score 0-No pain      General Information   HPI She was readmitted on 02/12/2020 with nausea and MRI brain done revealing patchy acute infarcts in bilateral cerebral deep watershed white matter and along high left sylvian fissure with focal swelling and small volume SAH. 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 SAH felt to be in setting of significant thrombosis and she was started on hypertonic saline. She underwent cerebral angio with revascularization of occluded right and left transverse sinuses and partially of right sigmoid sinus with improvement in flow and decompression of venous system.    Mobility Status walked independently      Balance Screen   Has the patient fallen in the past 6 months No      Prior Functional Status  Cognitive/Linguistic Baseline Within functional limits    Type of Home House     Lives With Family    Available Support Family    Education college    Vocation On disability      Cognition   Overall Cognitive Status Within Functional Limits for tasks assessed   pt reports she has returned to baseline     Auditory Comprehension   Overall Auditory Comprehension Appears within functional limits for tasks assessed      Verbal Expression   Overall Verbal Expression Appears within functional limits for tasks assessed      Oral Motor/Sensory Function   Overall Oral Motor/Sensory Function Appears within functional limits for tasks assessed      Motor Speech   Overall Motor Speech Appears within functional limits for tasks assessed      Standardized Assessments    Standardized Assessments  Montreal Cognitive Assessment ([place])    Montreal Cognitive Assessment ([place])  WNL 28/30                           SLP Education - 03/29/20 1345    Education Details eval results, recommendations for work environment, request referral as needed    Person(s) Educated Patient    Comprehension Verbalized understanding                Plan - 03/29/20 1402    Clinical Impression Statement Jill Clarke reports she has returned to baseline with no concerns reported warranting ST intervention. Pt denied any difficulty with swallowing, speech/language, or cognition. SLP reviewed medication management, current responsibilites, and opportunities to return to independent living. Pt reports she lives with her mother, who manages cooking, clearning, and majority of finances as "it is her home". Pt endorsed most of her other bills have been paid off and that she manages remainder of bills without difficulty. Pt reports she manages medicines without errors or misdosages. Pt states she is in "rest mode" and does not plan to return to independent living anytime soon. SLP completed MOCA with score of 28/30, which is WNL. Pt immediately recalled 4/5 words after 5 minute delay and reversed drawing of cube. Pt able to follow and perform instructions with rare repetition and no clarification. Pt endorsed she does not require ST intervention at this time as she has returned to baseline. SLP educated patient that she may request referral for ST evaluation if she notices changes or decline in cognitive linguistic skills, in which pt verbalized understanding.    Speech Therapy Frequency 1x /week    Duration 1 week    Treatment/Interventions Patient/family education;SLP instruction and feedback;Functional tasks    Potential to Achieve Goals Good    Consulted and Agree with Plan of Care Patient           Patient will benefit from skilled therapeutic intervention in order to  improve the following deficits and impairments:   Cognitive communication deficit    Problem List Patient Active Problem List   Diagnosis Date Noted  . Neurologic neglect syndrome   . Hypotension   . Cerebral vein thrombosis 02/28/2020  . Hypokalemia 02/28/2020  . Mastoiditis   . Acute blood loss anemia   . Seizures ([place])   . 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 Respiratory failure ([place])   . Acute cerebral venous sinus thrombosis 02/12/2020  . Unspecified contraceptive management 10/02/2011  .  Unspecified high-risk pregnancy 01/27/2011  . Low birth weight 01/23/2011  . Asthma 01/23/2011  . Short interval between pregnancies complicating pregnancy, antepartum 01/23/2011  . Nausea and vomiting in pregnancy 12/28/2010    Alinda Deem, MA CCC-SLP 03/29/2020, 2:12 PM  [facility] [address] [place], [place], [address] Phone: [phone]   Fax:  [phone]  Name: Jill Clarke MRN: [id number] Date of Birth: September 15, 1985

## 2020-04-06 ENCOUNTER — Ambulatory Visit (HOSPITAL_COMMUNITY): Admission: RE | Admit: 2020-04-06 | Payer: 59 | Source: Ambulatory Visit

## 2020-04-15 ENCOUNTER — Encounter: Payer: Self-pay | Admitting: Physical Medicine & Rehabilitation

## 2020-04-15 ENCOUNTER — Other Ambulatory Visit: Payer: Self-pay

## 2020-04-15 ENCOUNTER — Encounter: Payer: 59 | Attending: Physical Medicine & Rehabilitation | Admitting: Physical Medicine & Rehabilitation

## 2020-04-15 ENCOUNTER — Other Ambulatory Visit: Payer: Self-pay | Admitting: Physical Medicine & Rehabilitation

## 2020-04-15 VITALS — BP 93/69 | HR 89 | Temp 99.0°F | Ht 60.0 in | Wt 159.0 lb

## 2020-04-15 DIAGNOSIS — I6330 Cerebral infarction due to thrombosis of unspecified cerebral artery: Secondary | ICD-10-CM | POA: Diagnosis present

## 2020-04-15 DIAGNOSIS — I959 Hypotension, unspecified: Secondary | ICD-10-CM | POA: Diagnosis present

## 2020-04-15 DIAGNOSIS — K219 Gastro-esophageal reflux disease without esophagitis: Secondary | ICD-10-CM

## 2020-04-15 DIAGNOSIS — I6602 Occlusion and stenosis of left middle cerebral artery: Secondary | ICD-10-CM

## 2020-04-15 DIAGNOSIS — R569 Unspecified convulsions: Secondary | ICD-10-CM | POA: Diagnosis not present

## 2020-04-15 MED ORDER — DABIGATRAN ETEXILATE MESYLATE 150 MG PO CAPS: 150.0000 mg | ORAL_CAPSULE | Freq: Two times a day (BID) | ORAL | 0 refills | Status: DC

## 2020-04-15 MED ORDER — LEVETIRACETAM 500 MG PO TABS: 500.0000 mg | ORAL_TABLET | Freq: Two times a day (BID) | ORAL | 0 refills | Status: DC

## 2020-04-15 MED ORDER — ATORVASTATIN CALCIUM 40 MG PO TABS: 40.0000 mg | ORAL_TABLET | Freq: Every day | ORAL | 0 refills | Status: DC

## 2020-04-15 MED ORDER — PANTOPRAZOLE SODIUM 40 MG PO TBEC: 40.0000 mg | DELAYED_RELEASE_TABLET | Freq: Every day | ORAL | 0 refills | Status: DC

## 2020-04-15 MED FILL — levETIRAcetam 500 MG TABS: 500 | 30 days supply | Qty: 60 | Fill #0

## 2020-04-15 MED FILL — ATORVASTATIN CALCIUM 40 MG: 40 | 30 days supply | Qty: 30 | Fill #0

## 2020-04-15 MED FILL — PRADAXA 150 MG CAP: 150 | 30 days supply | Qty: 60 | Fill #0

## 2020-04-15 MED FILL — PANTOPRAZOLE SOD DR 40 MG T: 40 | 30 days supply | Qty: 30 | Fill #0

## 2020-04-15 NOTE — Progress Notes (Signed)
Subjective:    Patient ID: Jill Clarke, female    DOB: 05/25/1985, 35 y.o.   MRN: [id number]  HPI Female with T2DM, sickle cell trait who was admitted on 02/12/2020 with 1 week history of nausea vomiting and headaches presents for follow up for bilateral SAH, watershed infarcts, and venous thrombosis.  Admit date: 02/28/2020 Discharge date: 03/05/2020  At discharge, she was instructed to follow up with PCP, which she has not done yet. She states she Neurology. Denies falls. She states she has problems with memory. Denies seizures, states she was told she never had seizures. She is not in therapies.  She states she is doing HEP.  Did not require DME. Works in a [employer].  Pain Inventory Average Pain 4 Pain Right Now 4 My pain is aching  LOCATION OF PAIN  Head, shoulder  BOWEL Number of stools per week: ? Oral laxative use Yes  Type of laxative miralax Enema or suppository use No  History of colostomy No  Incontinent No   BLADDER Normal In and out cath, frequency n/a Able to self cath n/a Bladder incontinence No  Frequent urination No  Leakage with coughing No  Difficulty starting stream No  Incomplete bladder emptying No    Mobility walk without assistance do you drive?  no Do you have any goals in this area?  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 Sickle cell trait Mother   . Depression Mother   . Asthma Father   . Drug abuse Father        heroin abuse  . Kidney disease Father        due to heroin abuse  . Diabetes Maternal Grandmother    Social History   Socioeconomic History  . Marital status: Divorced    Spouse name: Not on file  . Number of children: Not on file  . Years of education: Not on file  . Highest education level: Not on file  Occupational History  . Not  on file  Tobacco Use  . Smoking status: Former Research scientist (life sciences)  . Smokeless tobacco: Never Used  Substance and Sexual Activity  . Alcohol use: No  . Drug use: No  . Sexual activity: Yes    Birth control/protection: Implant  Other Topics Concern  . Not on file  Social History Narrative  . Not on file   Social Determinants of Health   Financial Resource Strain: Not on file  Food Insecurity: Not on file  Transportation Needs: Not on file  Physical Activity: Not on file  Stress: Not on file  Social Connections: Not on file   Past Surgical History:  Procedure Laterality Date  . IR ANGIO INTRA EXTRACRAN SEL COM CAROTID INNOMINATE UNI R MOD SED  02/13/2020  . IR ANGIO INTRA EXTRACRAN SEL COM CAROTID INNOMINATE UNI R MOD SED  02/15/2020  . IR ANGIO INTRA EXTRACRAN SEL INTERNAL CAROTID UNI L MOD SED  02/13/2020  . IR ANGIO VERTEBRAL SEL VERTEBRAL UNI R MOD SED  02/13/2020  . IR CT HEAD LTD  02/13/2020  . IR CT HEAD LTD  02/15/2020  . IR PTA VENOUS EXCEPT DIALYSIS CIRCUIT  02/15/2020  . IR THROMBECT VENO MECH MOD SED  02/13/2020  . IR THROMBECT VENO MECH REPT MOD SED  02/15/2020  . RADIOLOGY WITH  ANESTHESIA N/A 02/13/2020   Procedure: IR WITH ANESTHESIA;  Surgeon: Luanne Bras, MD;  Location: [facility];  Service: Radiology;  Laterality: N/A;  . RADIOLOGY WITH ANESTHESIA N/A 02/15/2020   Procedure: IR WITH ANESTHESIA;  Surgeon: Radiologist, Medication, MD;  Location: [facility];  Service: Radiology;  Laterality: N/A;  . TONSILLECTOMY    . WISDOM TOOTH EXTRACTION     Past Medical History:  Diagnosis Date  . Asthma    uses inhaler prn  . Diabetes mellitus without complication ([place])   . Sickle cell trait ([place])   . Unspecified high-risk pregnancy 01/27/2011   Pulse 89   Temp 99 F (37.2 C)   Ht 5' (1.524 m)   Wt 159 lb (72.1 kg)   SpO2 99%   BMI 31.05 kg/m   Opioid Risk Score:   Fall Risk Score:  `1  Depression screen PHQ 2/9  Depression screen PHQ 2/9 04/15/2020  Down, Depressed, Hopeless 0   PHQ - 2 Score 0  Altered sleeping 1  Tired, decreased energy 1  Change in appetite 1  Feeling bad or failure about yourself  0  Trouble concentrating 0  Moving slowly or fidgety/restless 0  Suicidal thoughts 0  PHQ-9 Score 3  Difficult doing work/chores Somewhat difficult   Review of Systems  Constitutional: Positive for appetite change and unexpected weight change.  HENT: Negative.   Eyes: Negative.   Respiratory: Negative.   Cardiovascular: Negative.   Gastrointestinal: Positive for constipation.  Endocrine: Negative.   Genitourinary: Negative.   Musculoskeletal: Negative.   Skin: Negative.   Allergic/Immunologic: Negative.   Neurological: Positive for weakness. Negative for numbness.  Hematological: Negative.   Psychiatric/Behavioral: Negative.   All other systems reviewed and are negative.     Objective:   Physical Exam Constitutional: No distress . Vital signs reviewed. HENT: Normocephalic.  Atraumatic. Eyes: EOMI. No discharge. Cardiovascular: No JVD.   Respiratory: Normal effort.  No stridor.   GI: Non-distended.   Skin: Warm and dry.  Intact. Psych: Normal mood.  Normal behavior. Musc: No edema in extremities.  No tenderness in extremities. Neuro; Alert Motor: RUE: 5/5 proximal to distal LUE: 5/5 proximal to distal RLE: HF 5/5, KE 4+/5, ADF 4+/5 LLE: HF: 4+/5, KE, ADF 4+/5    Assessment & Plan:  Female with T2DM, sickle cell trait who was admitted on 02/12/2020 with 1 week history of nausea vomiting and headaches presents for follow up for bilateral SAH, watershed infarcts, and venous thrombosis.  1.  HA, decline in ability to carry out ADLs as well as mobility secondary to bilateral SAH, watershed infarcts, and venous thrombosis             Cont HEP  Pradaxa refilled, encouraged follow up with Neuro  2. Focal motor seizure:   Cont meds  Needs to follow up with Neuro  Keprra refilled x2, encouraged follow up with Neuro  Patient states she was told  she did not have seizures - encouraged discussion with Neuro  Abstain from driving until follow up with Neuro  3. Hypotension:              Not on any prescription medications at present             Remains soft, but asymptomatic  4.  Dyslipidemia  Continue Lipitor, refilled  Follow up with PCP - needs to schedule appointment  5. GERD  Protonix refilled  Meds reviewed Referrals reviewed - needs PCP All questions

## 2020-04-27 ENCOUNTER — Inpatient Hospital Stay: Payer: 59 | Admitting: Neurology

## 2020-04-27 ENCOUNTER — Encounter: Payer: Self-pay | Admitting: Neurology

## 2020-05-27 ENCOUNTER — Encounter: Payer: 59 | Attending: Physical Medicine & Rehabilitation | Admitting: Physical Medicine & Rehabilitation

## 2020-05-27 DIAGNOSIS — I6602 Occlusion and stenosis of left middle cerebral artery: Secondary | ICD-10-CM | POA: Insufficient documentation

## 2020-05-27 DIAGNOSIS — K219 Gastro-esophageal reflux disease without esophagitis: Secondary | ICD-10-CM | POA: Insufficient documentation

## 2020-05-27 DIAGNOSIS — I6330 Cerebral infarction due to thrombosis of unspecified cerebral artery: Secondary | ICD-10-CM | POA: Insufficient documentation

## 2020-05-27 DIAGNOSIS — R569 Unspecified convulsions: Secondary | ICD-10-CM | POA: Insufficient documentation

## 2020-05-27 DIAGNOSIS — I959 Hypotension, unspecified: Secondary | ICD-10-CM | POA: Insufficient documentation

## 2020-06-20 ENCOUNTER — Encounter (HOSPITAL_COMMUNITY): Payer: Self-pay | Admitting: Emergency Medicine

## 2020-06-20 ENCOUNTER — Ambulatory Visit (INDEPENDENT_AMBULATORY_CARE_PROVIDER_SITE_OTHER)
Admission: EM | Admit: 2020-06-20 | Discharge: 2020-06-20 | Disposition: A | Discharge status: Home / Self Care | Payer: Medicaid Other | Source: Home / Self Care

## 2020-06-20 ENCOUNTER — Emergency Department (HOSPITAL_COMMUNITY): Payer: Medicaid Other

## 2020-06-20 ENCOUNTER — Encounter (HOSPITAL_COMMUNITY): Payer: Self-pay

## 2020-06-20 ENCOUNTER — Emergency Department (HOSPITAL_COMMUNITY)
Admission: EM | Admit: 2020-06-20 | Discharge: 2020-06-21 | Disposition: A | Discharge status: Home / Self Care | Payer: Medicaid Other | Attending: Emergency Medicine | Admitting: Emergency Medicine

## 2020-06-20 ENCOUNTER — Other Ambulatory Visit: Payer: Self-pay

## 2020-06-20 DIAGNOSIS — R000 Tachycardia, unspecified: Secondary | ICD-10-CM | POA: Diagnosis not present

## 2020-06-20 DIAGNOSIS — R509 Fever, unspecified: Secondary | ICD-10-CM | POA: Diagnosis not present

## 2020-06-20 DIAGNOSIS — R1110 Vomiting, unspecified: Secondary | ICD-10-CM | POA: Insufficient documentation

## 2020-06-20 DIAGNOSIS — Z20822 Contact with and (suspected) exposure to covid-19: Secondary | ICD-10-CM | POA: Insufficient documentation

## 2020-06-20 DIAGNOSIS — E119 Type 2 diabetes mellitus without complications: Secondary | ICD-10-CM | POA: Insufficient documentation

## 2020-06-20 DIAGNOSIS — Z87891 Personal history of nicotine dependence: Secondary | ICD-10-CM | POA: Insufficient documentation

## 2020-06-20 DIAGNOSIS — J039 Acute tonsillitis, unspecified: Secondary | ICD-10-CM | POA: Insufficient documentation

## 2020-06-20 DIAGNOSIS — J029 Acute pharyngitis, unspecified: Secondary | ICD-10-CM | POA: Diagnosis not present

## 2020-06-20 LAB — CBC WITH DIFFERENTIAL/PLATELET
Abs Immature Granulocytes: 0.1 10*3/uL — ABNORMAL HIGH (ref 0.00–0.07)
Basophils Absolute: 0.1 10*3/uL (ref 0.0–0.1)
Basophils Relative: 0 %
Eosinophils Absolute: 0.1 10*3/uL (ref 0.0–0.5)
Eosinophils Relative: 0 %
HCT: 36.9 % (ref 36.0–46.0)
Hemoglobin: 11.4 g/dL — ABNORMAL LOW (ref 12.0–15.0)
Immature Granulocytes: 1 %
Lymphocytes Relative: 11 %
Lymphs Abs: 1.8 10*3/uL (ref 0.7–4.0)
MCH: 27.1 pg (ref 26.0–34.0)
MCHC: 30.9 g/dL (ref 30.0–36.0)
MCV: 87.6 fL (ref 80.0–100.0)
Monocytes Absolute: 1.4 10*3/uL — ABNORMAL HIGH (ref 0.1–1.0)
Monocytes Relative: 8 %
Neutro Abs: 13.8 10*3/uL — ABNORMAL HIGH (ref 1.7–7.7)
Neutrophils Relative %: 80 %
Platelets: 721 10*3/uL — ABNORMAL HIGH (ref 150–400)
RBC: 4.21 MIL/uL (ref 3.87–5.11)
RDW: 16.5 % — ABNORMAL HIGH (ref 11.5–15.5)
WBC: 17.2 10*3/uL — ABNORMAL HIGH (ref 4.0–10.5)
nRBC: 0 % (ref 0.0–0.2)

## 2020-06-20 LAB — COMPREHENSIVE METABOLIC PANEL
ALT: 21 U/L (ref 0–44)
AST: 20 U/L (ref 15–41)
Albumin: 4.3 g/dL (ref 3.5–5.0)
Alkaline Phosphatase: 78 U/L (ref 38–126)
Anion gap: 8 (ref 5–15)
BUN: 8 mg/dL (ref 6–20)
CO2: 23 mmol/L (ref 22–32)
Calcium: 9.6 mg/dL (ref 8.9–10.3)
Chloride: 105 mmol/L (ref 98–111)
Creatinine, Ser: 0.51 mg/dL (ref 0.44–1.00)
GFR, Estimated: >60 mL/min (ref >60)
Glucose, Bld: 127 mg/dL — ABNORMAL HIGH (ref 70–99)
Potassium: 3.3 mmol/L — ABNORMAL LOW (ref 3.5–5.1)
Sodium: 136 mmol/L (ref 135–145)
Total Bilirubin: 0.6 mg/dL (ref 0.3–1.2)
Total Protein: 8.8 g/dL — ABNORMAL HIGH (ref 6.5–8.1)

## 2020-06-20 LAB — POCT RAPID STREP A, ED / UC: Streptococcus, Group A Screen (Direct): NEGATIVE

## 2020-06-20 LAB — LACTIC ACID, PLASMA: Lactic Acid, Venous: 1.3 mmol/L (ref 0.5–1.9)

## 2020-06-20 LAB — I-STAT BETA HCG BLOOD, ED (MC, WL, AP ONLY): I-stat hCG, quantitative: <5 m[IU]/mL (ref <5)

## 2020-06-20 IMAGING — CT CT NECK W/O CM
3 of 4 series · 12 of 33 positions shown, 14 images · non-contrast
Comparison: None.

CLINICAL DATA: Initial evaluation for acute sore throat, vomiting.

EXAM:
CT NECK WITHOUT CONTRAST
TECHNIQUE: Multidetector CT imaging of the neck was performed following the
standard protocol without intravenous contrast.

[Series 5: orthogonal ax · axial · 0.31mm/px · z∈[-374,-190]mm · 4 of 134 slices shown, 5 images]
[im 20/134  soft-tissue]
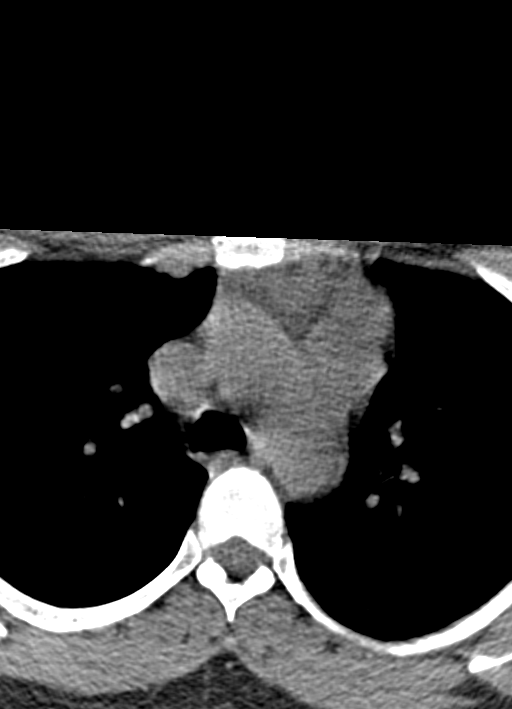
[im 20/134  bone]
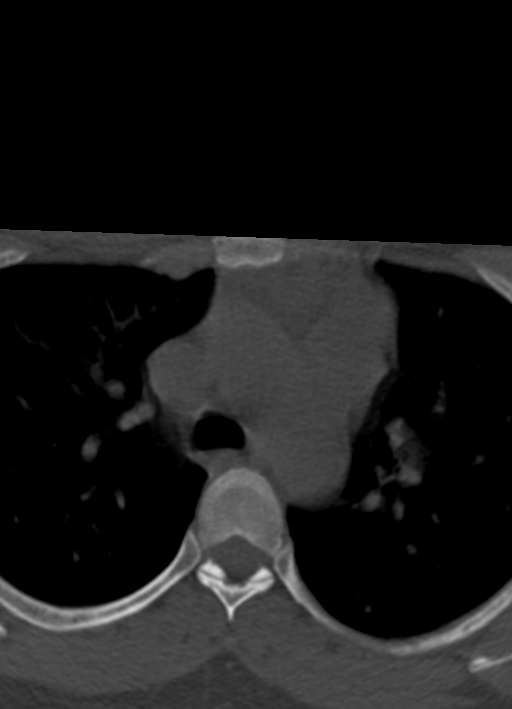
[im 58/134  bone]
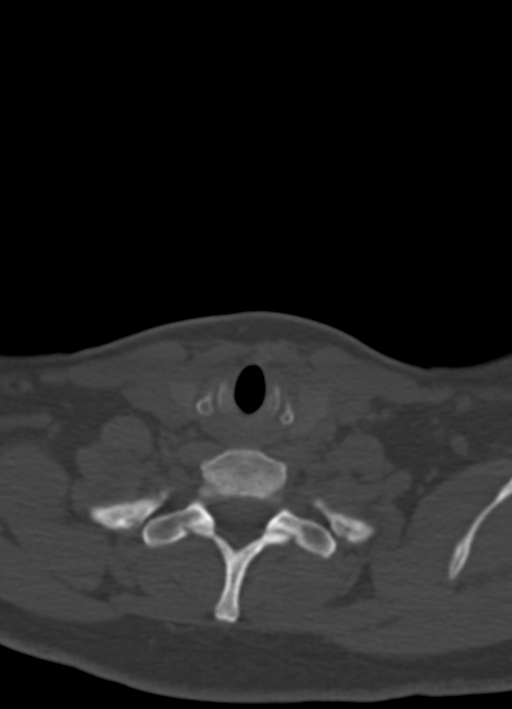
[im 77/134  bone]
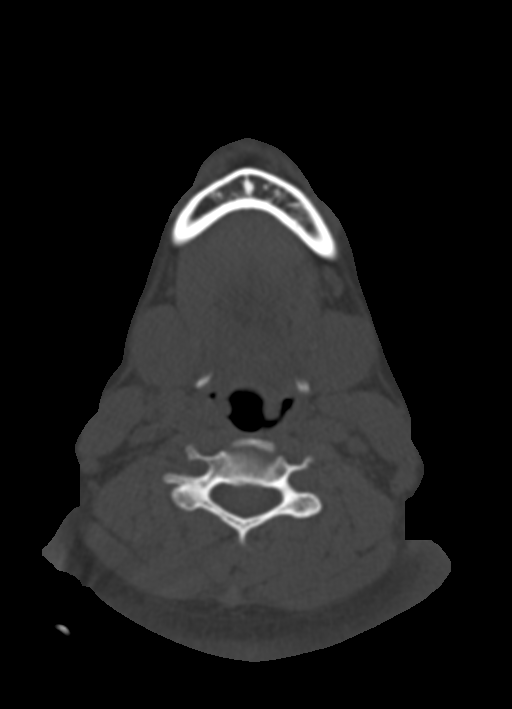
[im 115/134  bone]
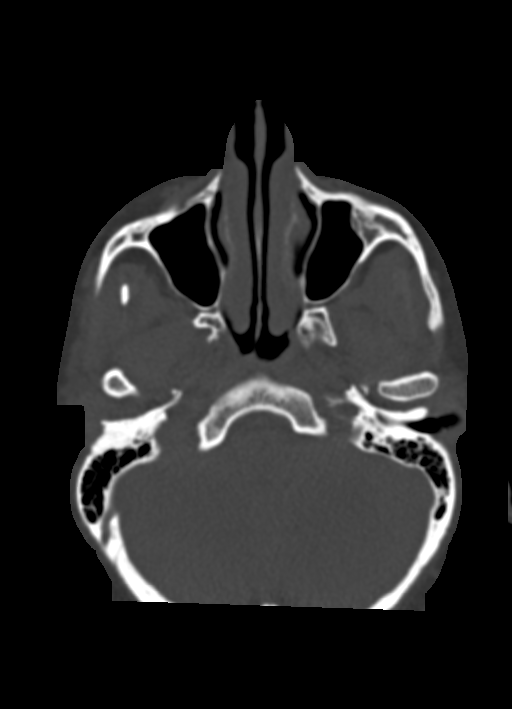

[Series 6: cor neck · coronal · 0.31mm/px · 3 of 112 slices shown]
[im 26/112  bone]
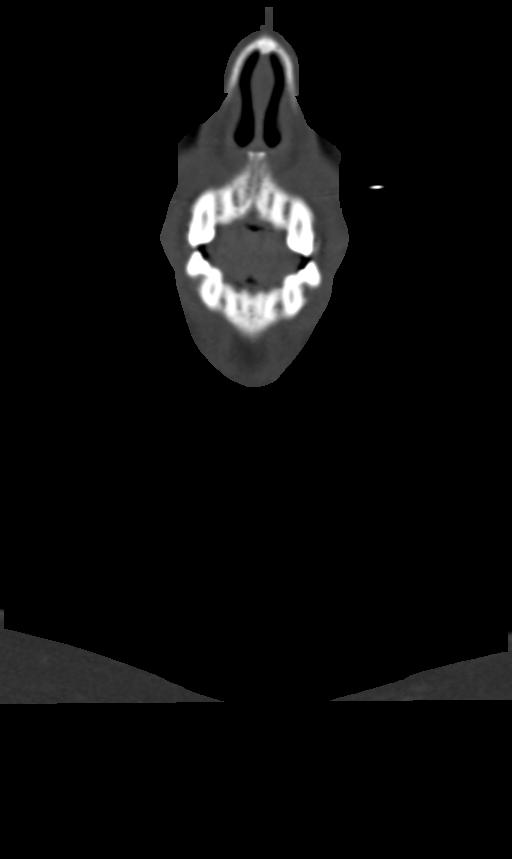
[im 46/112  bone]
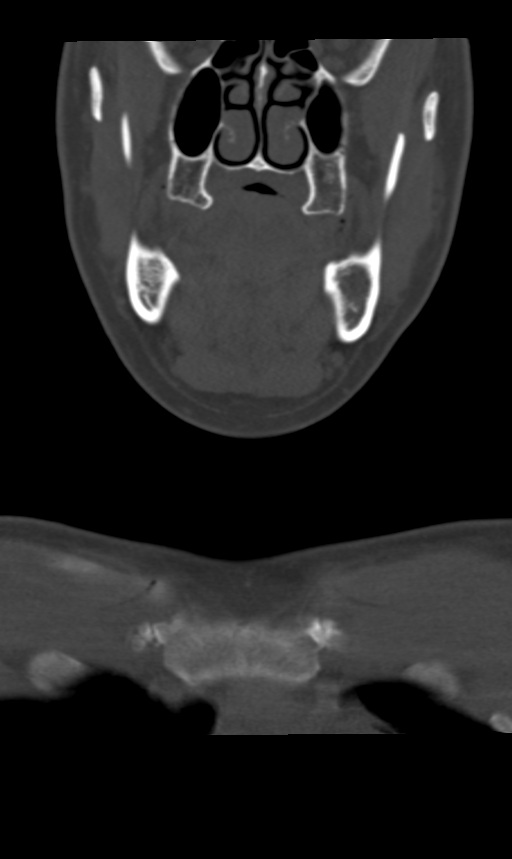
[im 66/112  bone]
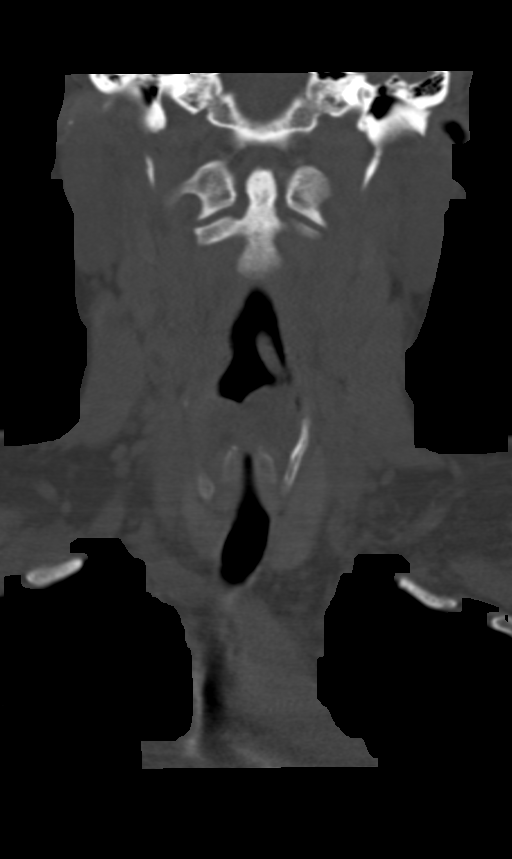

[Series 7: sag neck · sagittal · 0.44mm/px · 5 of 81 slices shown, 6 images]
[im 27/81  bone]
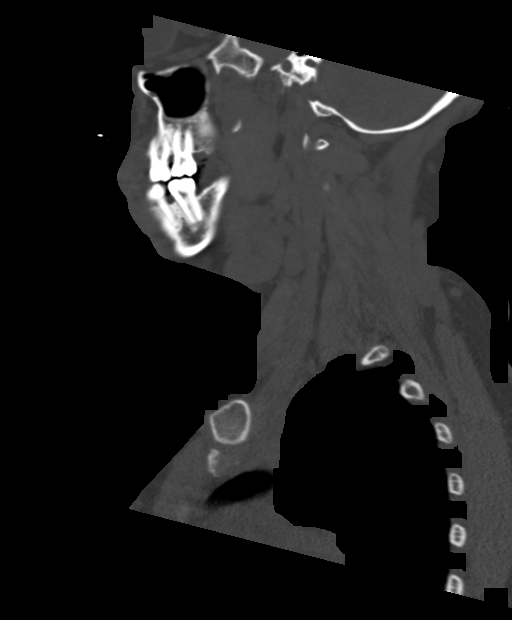
[im 34/81  bone]
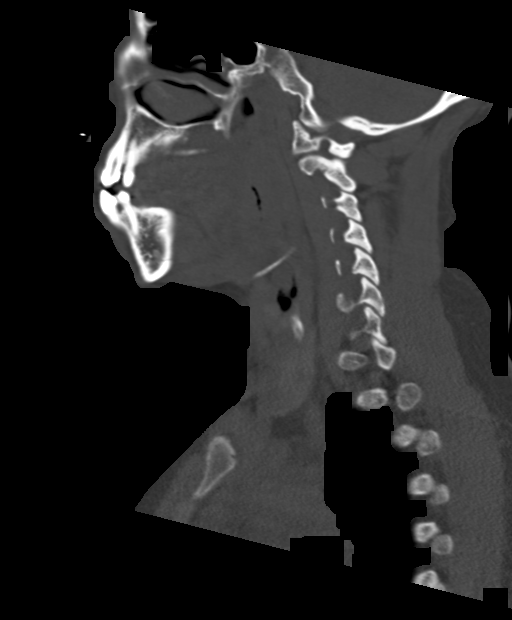
[im 41/81  soft-tissue]
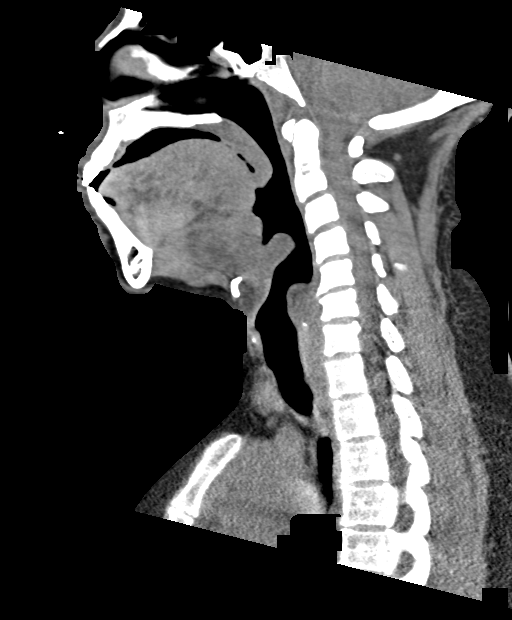
[im 41/81  bone]
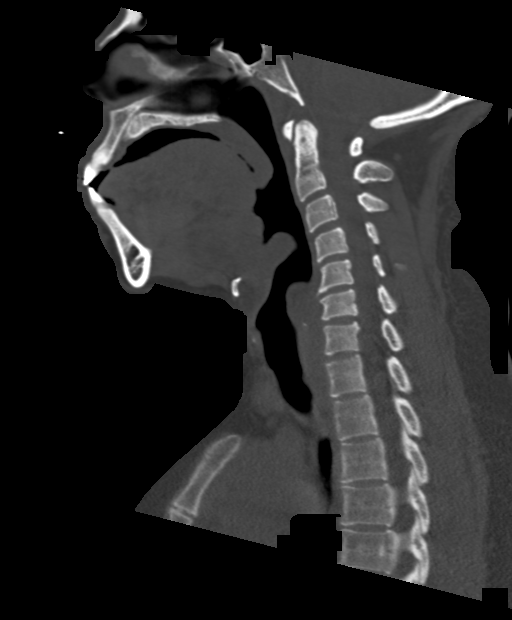
[im 47/81  bone]
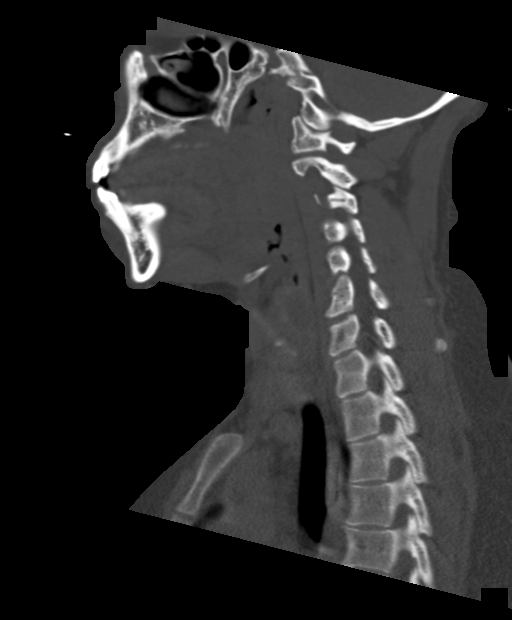
[im 54/81  bone]
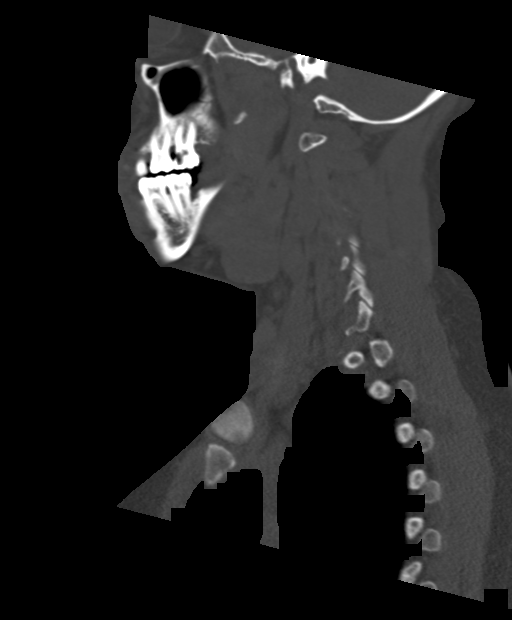

[12 of 33 positions shown; findings below may reference images not displayed]

FINDINGS: Pharynx and larynx: Oral cavity within normal limits. No acute
abnormality about the dentition. Palatine tonsils symmetric and
within normal limits. No tonsillar or peritonsillar abscess.
Nasopharynx normal. No retropharyngeal collection or swelling. There
is abnormal thickening and edema involving the epiglottis and
aryepiglottic folds, consistent with acute
epiglottitis/supraglottitis. No discrete abscess or collection. No
more than mild narrowing of the supraglottic airway at this time.
Glottis normal. Subglottic airway patent clear.

Salivary glands: Salivary glands including the parotid and
submandibular glands are within normal limits.

Thyroid: Normal.

Lymph nodes: No enlarged or pathologic adenopathy within the neck.

Vascular: Evaluation of the vascular structures limited by lack of
IV contrast.

Limited intracranial: Unremarkable.

Visualized orbits: Unremarkable.

Mastoids and visualized paranasal sinuses: Visualized paranasal
sinuses are clear. Visualized mastoid air cells and middle ear
cavities are well pneumatized and free of fluid.

Skeleton: No acute osseous finding. No discrete or worrisome osseous
lesions.

Upper chest: Visualized upper chest demonstrates no acute finding.
Partially visualized lungs are clear.

Other: None.
IMPRESSION: Findings consistent with acute epiglottitis/supraglottitis. No
discrete abscess or collection. Finding could be either infectious
or inflammatory/allergic in nature. Close clinical monitoring
recommended, as this patient is potentially at risk for developing
airway compromise.

## 2020-06-20 MED ORDER — DEXAMETHASONE SODIUM PHOSPHATE 10 MG/ML IJ SOLN
10.0000 mg | Freq: Once | INTRAMUSCULAR | Status: AC
  Administered 2020-06-20: 10 mg via INTRAVENOUS
  Filled 2020-06-20: qty 1

## 2020-06-20 MED ORDER — AMOXICILLIN-POT CLAVULANATE 875-125 MG PO TABS: 1.0000 | ORAL_TABLET | Freq: Two times a day (BID) | ORAL | 0 refills | Status: DC

## 2020-06-20 MED ORDER — SODIUM CHLORIDE 0.9 % IV BOLUS
1000.0000 mL | Freq: Once | INTRAVENOUS | Status: AC
  Administered 2020-06-20: 1000 mL via INTRAVENOUS

## 2020-06-20 MED ORDER — KETOROLAC TROMETHAMINE 30 MG/ML IJ SOLN
30.0000 mg | Freq: Once | INTRAMUSCULAR | Status: AC
  Administered 2020-06-20: 30 mg via INTRAVENOUS
  Filled 2020-06-20: qty 1

## 2020-06-20 MED ORDER — ONDANSETRON HCL 4 MG/2ML IJ SOLN
4.0000 mg | Freq: Once | INTRAMUSCULAR | Status: AC
  Administered 2020-06-20: 4 mg via INTRAVENOUS
  Filled 2020-06-20: qty 2

## 2020-06-20 MED ORDER — SODIUM CHLORIDE 0.9 % IV SOLN
3.0000 g | Freq: Once | INTRAVENOUS | Status: AC
  Administered 2020-06-20: 3 g via INTRAVENOUS
  Filled 2020-06-20: qty 8

## 2020-06-20 MED ORDER — ONDANSETRON 4 MG PO TBDP
4.0000 mg | ORAL_TABLET | Freq: Once | ORAL | Status: AC | PRN
  Administered 2020-06-20: 4 mg via ORAL
  Filled 2020-06-20: qty 1

## 2020-06-20 NOTE — ED Triage Notes (Signed)
Patient arrives complaining of vomiting. Patient was recently prescribed abx for a possible infection. Patient states sore throat. Patient states she got choked on her abx today and began vomiting and has not been able to stop.

## 2020-06-20 NOTE — ED Triage Notes (Signed)
Pt present sore throat with difficulty swallowing. Symptoms started yesterday. Pt state it feels like something is stuck in her throat.

## 2020-06-20 NOTE — ED Provider Notes (Signed)
[facility] DEPT Provider Note   CSN: [id number] Arrival date & time: 06/20/20  2213     History Chief Complaint  Patient presents with  . Vomiting    Jill Clarke is a 35 y.o. female.  She was seen in urgent care today and tested for strep.  Strep test was negative.  She was given Augmentin.  She states that she has been vomiting and unable to tolerate anything by mouth since then.  The history is provided by the patient.  Sore Throat This is a new problem. The current episode started 12 to 24 hours ago. The problem occurs constantly. The problem has been rapidly worsening. Pertinent negatives include no chest pain, no abdominal pain, no headaches and no shortness of breath. The symptoms are aggravated by swallowing. Nothing relieves the symptoms. Treatments tried: augmentin x 1. The treatment provided no relief.       Past Medical History:  Diagnosis Date  . Asthma    uses inhaler prn  . Diabetes mellitus without complication ([place])   . Sickle cell trait ([place])   . Unspecified high-risk pregnancy 01/27/2011    Patient Active Problem List   Diagnosis Date Noted  . Gastroesophageal reflux disease 04/15/2020  . Neurologic neglect syndrome   . Hypotension   . Cerebral vein thrombosis 02/28/2020  . Hypokalemia 02/28/2020  . Mastoiditis   . Acute blood loss anemia   . Seizures ([place])   . 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 Respiratory failure ([place])   . Acute cerebral venous sinus thrombosis 02/12/2020  . Unspecified contraceptive management 10/02/2011  . Unspecified high-risk pregnancy 01/27/2011  . Low birth weight 01/23/2011  . Asthma 01/23/2011  . Short interval between pregnancies complicating pregnancy, antepartum 01/23/2011  . Nausea and vomiting in pregnancy 12/28/2010    Past Surgical History:  Procedure  Laterality Date  . IR ANGIO INTRA EXTRACRAN SEL COM CAROTID INNOMINATE UNI R MOD SED  02/13/2020  . IR ANGIO INTRA EXTRACRAN SEL COM CAROTID INNOMINATE UNI R MOD SED  02/15/2020  . IR ANGIO INTRA EXTRACRAN SEL INTERNAL CAROTID UNI L MOD SED  02/13/2020  . IR ANGIO VERTEBRAL SEL VERTEBRAL UNI R MOD SED  02/13/2020  . IR CT HEAD LTD  02/13/2020  . IR CT HEAD LTD  02/15/2020  . IR PTA VENOUS EXCEPT DIALYSIS CIRCUIT  02/15/2020  . IR THROMBECT VENO MECH MOD SED  02/13/2020  . IR THROMBECT VENO MECH REPT MOD SED  02/15/2020  . RADIOLOGY WITH ANESTHESIA N/A 02/13/2020   Procedure: IR WITH ANESTHESIA;  Surgeon: Luanne Bras, MD;  Location: [facility];  Service: Radiology;  Laterality: N/A;  . RADIOLOGY WITH ANESTHESIA N/A 02/15/2020   Procedure: IR WITH ANESTHESIA;  Surgeon: Radiologist, Medication, MD;  Location: [facility];  Service: Radiology;  Laterality: N/A;  . TONSILLECTOMY    . WISDOM TOOTH EXTRACTION       OB History    Gravida  2   Para  2   Term  2   Preterm  0   AB  0   Living  2     SAB  0   IAB  0   Ectopic  0   Multiple  0   Live Births  2           Family History  Problem Relation Age of Onset  . Sickle cell trait  Mother   . Depression Mother   . Asthma Father   . Drug abuse Father        heroin abuse  . Kidney disease Father        due to heroin abuse  . Diabetes Maternal Grandmother     Social History   Tobacco Use  . Smoking status: Former Research scientist (life sciences)  . Smokeless tobacco: Never Used  Substance Use Topics  . Alcohol use: No  . Drug use: No    Home Medications Prior to Admission medications   Medication Sig Start Date End Date Taking? Authorizing Provider  acetaminophen (TYLENOL) 325 MG tablet Take 1-2 tablets (325-650 mg total) by mouth every 4 (four) hours as needed for mild pain. 03/04/20   Love, Ivan Anchors, PA-C  amoxicillin-clavulanate (AUGMENTIN) 875-125 MG tablet Take 1 tablet by mouth every 12 (twelve) hours. 06/20/20   Pearson Forster, NP   atorvastatin (LIPITOR) 40 MG tablet Take 1 tablet (40 mg total) by mouth daily. 04/15/20   Jamse Arn, MD  atorvastatin (LIPITOR) 40 MG tablet TAKE 1 TABLET (40 MG TOTAL) BY MOUTH DAILY. 03/05/20 03/05/21  Love, Ivan Anchors, PA-C  atorvastatin (LIPITOR) 40 MG tablet TAKE 1 TABLET BY MOUTH ONCE A DAY 04/15/20 04/15/21  Jamse Arn, MD  dabigatran (PRADAXA) 150 MG CAPS capsule Take 1 capsule (150 mg total) by mouth every 12 (twelve) hours. 04/15/20   Jamse Arn, MD  dabigatran (PRADAXA) 150 MG CAPS capsule TAKE 1 CAPSULE (150 MG TOTAL) BY MOUTH EVERY TWELVE HOURS. 03/04/20 03/04/21  Bary Leriche, PA-C  dabigatran (PRADAXA) 150 MG CAPS capsule TAKE 1 CAPSULE BY MOUTH EVERY 12 HOURS 04/15/20 04/15/21  Jamse Arn, MD  docusate sodium (COLACE) 100 MG capsule Take 1 capsule (100 mg total) by mouth 2 (two) times daily. 03/05/20   Love, Ivan Anchors, PA-C  docusate sodium (COLACE) 100 MG capsule TAKE 1 CAPSULE (100 MG TOTAL) BY MOUTH TWO TIMES DAILY. 03/05/20 03/05/21  Love, Ivan Anchors, PA-C  levETIRAcetam (KEPPRA) 500 MG tablet Take 1 tablet (500 mg total) by mouth 2 (two) times daily. 04/15/20   Jamse Arn, MD  levETIRAcetam (KEPPRA) 500 MG tablet TAKE 1 TABLET (500 MG TOTAL) BY MOUTH TWO TIMES DAILY. 03/05/20 03/05/21  Love, Ivan Anchors, PA-C  levETIRAcetam (KEPPRA) 500 MG tablet TAKE 1 TABLET BY MOUTH 2 TIMES DAILY 04/15/20 04/15/21  Jamse Arn, MD  pantoprazole (PROTONIX) 40 MG tablet Take 1 tablet (40 mg total) by mouth daily. 04/15/20   Jamse Arn, MD  pantoprazole (PROTONIX) 40 MG tablet TAKE 1 TABLET (40 MG TOTAL) BY MOUTH DAILY. 03/05/20 03/05/21  Love, Ivan Anchors, PA-C  pantoprazole (PROTONIX) 40 MG tablet TAKE 1 TABLET BY MOUTH ONCE A DAY 04/15/20 04/15/21  Jamse Arn, MD  polyethylene glycol (MIRALAX / GLYCOLAX) 17 g packet Take 17 g by mouth daily. 03/06/20   Love, Ivan Anchors, PA-C  polyethylene glycol powder (GLYCOLAX/MIRALAX) 17 GM/SCOOP powder DISSOLVE 1 CAPFUL (17 G) IN  WATE AND DRINK DAILY. 03/05/20 03/05/21  Bary Leriche, PA-C    Allergies    Patient has no known allergies.  Review of Systems   Review of Systems  Constitutional: Positive for fever. Negative for chills.  HENT: Positive for sore throat. Negative for ear pain.   Eyes: Negative for pain and visual disturbance.  Respiratory: Negative for cough and shortness of breath.   Cardiovascular: Negative for chest pain and palpitations.  Gastrointestinal: Positive for nausea and  vomiting. Negative for abdominal pain.  Genitourinary: Negative for dysuria and hematuria.  Musculoskeletal: Negative for arthralgias and back pain.  Skin: Negative for color change and rash.  Neurological: Negative for seizures, syncope and headaches.  All other systems reviewed and are negative.   Physical Exam Updated Vital Signs BP 125/86 (BP Location: Left Arm)   Pulse (!) 123   Temp (!) 101 F (38.3 C) (Oral)   Resp (!) 24   Ht 5' (1.524 m)   Wt 72.1 kg   SpO2 99%   BMI 31.05 kg/m   Physical Exam Vitals and nursing note reviewed.  Constitutional:      Appearance: She is ill-appearing.     Comments: Actively vomiting. Able to handle secretions.  HENT:     Head: Normocephalic and atraumatic.     Mouth/Throat:     Mouth: Mucous membranes are moist.     Pharynx: Uvula midline. Posterior oropharyngeal erythema present. No oropharyngeal exudate.     Tonsils: No tonsillar exudate.     Comments: Erythema at the posterior pharynx.  There appears to be some palatal elevation at the left peritonsillar region. Speaks in full sentences Eyes:     General: No scleral icterus. Cardiovascular:     Rate and Rhythm: Tachycardia present.  Pulmonary:     Effort: Pulmonary effort is normal. No respiratory distress.  Musculoskeletal:     Cervical back: Normal range of motion.  Lymphadenopathy:     Cervical: Cervical adenopathy present.  Skin:    General: Skin is warm and dry.  Neurological:     Mental Status:  She is alert.  Psychiatric:        Mood and Affect: Mood normal.     ED Results / Procedures / Treatments   Labs (all labs ordered are listed, but only abnormal results are displayed) Labs Reviewed  COMPREHENSIVE METABOLIC PANEL - Abnormal; Notable for the following components:      Result Value   Potassium 3.3 (*)    Glucose, Bld 127 (*)    Total Protein 8.8 (*)    All other components within normal limits  CBC WITH DIFFERENTIAL/PLATELET - Abnormal; Notable for the following components:   WBC 17.2 (*)    Hemoglobin 11.4 (*)    RDW 16.5 (*)    Platelets 721 (*)    Neutro Abs 13.8 (*)    Monocytes Absolute 1.4 (*)    Abs Immature Granulocytes 0.10 (*)    All other components within normal limits  RESP PANEL BY RT-PCR (FLU A&B, COVID) ARPGX2  LACTIC ACID, PLASMA  I-STAT BETA HCG BLOOD, ED ([facility], [facility], [facility] ONLY)    EKG None  Radiology CT Soft Tissue Neck Wo Contrast  Result Date: 06/20/2020 CLINICAL DATA:  Initial evaluation for acute sore throat, vomiting. EXAM: CT NECK WITHOUT CONTRAST TECHNIQUE: Multidetector CT imaging of the neck was performed following the standard protocol without intravenous contrast. COMPARISON:  None. FINDINGS: Pharynx and larynx: Oral cavity within normal limits. No acute abnormality about the dentition. Palatine tonsils symmetric and within normal limits. No tonsillar or peritonsillar abscess. Nasopharynx normal. No retropharyngeal collection or swelling. There is abnormal thickening and edema involving the epiglottis and aryepiglottic folds, consistent with acute epiglottitis/supraglottitis. No discrete abscess or collection. No more than mild narrowing of the supraglottic airway at this time. Glottis normal. Subglottic airway patent clear. Salivary glands: Salivary glands including the parotid and submandibular glands are within normal limits. Thyroid: Normal. Lymph nodes: No enlarged or pathologic adenopathy within  the neck. Vascular: Evaluation of the  vascular structures limited by lack of IV contrast. Limited intracranial: Unremarkable. Visualized orbits: Unremarkable. Mastoids and visualized paranasal sinuses: Visualized paranasal sinuses are clear. Visualized mastoid air cells and middle ear cavities are well pneumatized and free of fluid. Skeleton: No acute osseous finding. No discrete or worrisome osseous lesions. Upper chest: Visualized upper chest demonstrates no acute finding. Partially visualized lungs are clear. Other: None. IMPRESSION: Findings consistent with acute epiglottitis/supraglottitis. No discrete abscess or collection. Finding could be either infectious or inflammatory/allergic in nature. Close clinical monitoring recommended, as this patient is potentially at risk for developing airway compromise. Electronically Signed   By: Jeannine Boga M.D.   On: 06/20/2020 23:45    Procedures Procedures   Medications Ordered in ED Medications  sodium chloride 0.9 % bolus 1,000 mL (has no administration in time range)  ketorolac (TORADOL) 30 MG/ML injection 30 mg (has no administration in time range)  dexamethasone (DECADRON) injection 10 mg (has no administration in time range)  ondansetron (ZOFRAN) injection 4 mg (has no administration in time range)  Ampicillin-Sulbactam (UNASYN) 3 g in sodium chloride 0.9 % 100 mL IVPB (has no administration in time range)  ondansetron (ZOFRAN-ODT) disintegrating tablet 4 mg (4 mg Oral Given 06/20/20 2230)    ED Course  I have reviewed the triage vital signs and the nursing notes.  Pertinent labs & imaging results that were available during my care of the patient were reviewed by me and considered in my medical decision making (see chart for details).    MDM Rules/Calculators/A&P                          Jill Clarke presents with fever and sore throat.  She is also having vomiting.  I am concerned about a possible peritonsillar abscess or other deep, soft tissue infection.  She  will be given symptomatic management with IV fluids, pain medications, and steroids.  I have also written for Un[facility].  She will have labs and a CT scan. Final Clinical Impression(s) / ED Diagnoses Final diagnoses:  Fever, unspecified fever cause  Pharyngitis, unspecified etiology    Rx / DC Orders ED Discharge Orders    None       Arnaldo Natal, MD 06/21/20 1718

## 2020-06-20 NOTE — Discharge Instructions (Addendum)
Take the Augmentin twice a day for the next 7 days.    You can take Tylenol and/or Ibuprofen as needed for fever reduction and pain relief.   For sore throat: try warm salt water gargles, cepacol lozenges, throat spray, warm tea or water with lemon/honey, popsicles or ice, or OTC cold relief medicine for throat discomfort.    It is important to stay hydrated: drink plenty of fluids (water, gatorade/powerade/pedialyte, juices, or teas) to keep your throat moisturized and help further relieve irritation/discomfort.   Return or go to the Emergency Department if symptoms worsen or do not improve in the next few days.

## 2020-06-20 NOTE — ED Notes (Signed)
Patient transported to CT 

## 2020-06-20 NOTE — ED Provider Notes (Signed)
[facility]    CSN: [id number] Arrival date & time: 06/20/20  1450      History   Chief Complaint Chief Complaint  Patient presents with  . Sore Throat    HPI Jill Clarke is a 35 y.o. female.   Patient here for evaluation of sore throat and painful swallowing that has been ongoing for the past several days.  Reports trying OTC treatments with minimal relief.  Denies any recent sick contacts.  Denies any trauma, injury, or other precipitating event.  Denies any fevers, chest pain, shortness of breath, N/V/D, numbness, tingling, weakness, abdominal pain, or headaches.    The history is provided by the patient.    Past Medical History:  Diagnosis Date  . Asthma    uses inhaler prn  . Diabetes mellitus without complication ([place])   . Sickle cell trait ([place])   . Unspecified high-risk pregnancy 01/27/2011    Patient Active Problem List   Diagnosis Date Noted  . Gastroesophageal reflux disease 04/15/2020  . Neurologic neglect syndrome   . Hypotension   . Cerebral vein thrombosis 02/28/2020  . Hypokalemia 02/28/2020  . Mastoiditis   . Acute blood loss anemia   . Seizures ([place])   . 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 Respiratory failure ([place])   . Acute cerebral venous sinus thrombosis 02/12/2020  . Unspecified contraceptive management 10/02/2011  . Unspecified high-risk pregnancy 01/27/2011  . Low birth weight 01/23/2011  . Asthma 01/23/2011  . Short interval between pregnancies complicating pregnancy, antepartum 01/23/2011  . Nausea and vomiting in pregnancy 12/28/2010    Past Surgical History:  Procedure Laterality Date  . IR ANGIO INTRA EXTRACRAN SEL COM CAROTID INNOMINATE UNI R MOD SED  02/13/2020  . IR ANGIO INTRA EXTRACRAN SEL COM CAROTID INNOMINATE UNI R MOD SED  02/15/2020  . IR ANGIO INTRA EXTRACRAN SEL INTERNAL CAROTID UNI L  MOD SED  02/13/2020  . IR ANGIO VERTEBRAL SEL VERTEBRAL UNI R MOD SED  02/13/2020  . IR CT HEAD LTD  02/13/2020  . IR CT HEAD LTD  02/15/2020  . IR PTA VENOUS EXCEPT DIALYSIS CIRCUIT  02/15/2020  . IR THROMBECT VENO MECH MOD SED  02/13/2020  . IR THROMBECT VENO MECH REPT MOD SED  02/15/2020  . RADIOLOGY WITH ANESTHESIA N/A 02/13/2020   Procedure: IR WITH ANESTHESIA;  Surgeon: Luanne Bras, MD;  Location: [facility];  Service: Radiology;  Laterality: N/A;  . RADIOLOGY WITH ANESTHESIA N/A 02/15/2020   Procedure: IR WITH ANESTHESIA;  Surgeon: Radiologist, Medication, MD;  Location: [facility];  Service: Radiology;  Laterality: N/A;  . TONSILLECTOMY    . WISDOM TOOTH EXTRACTION      OB History    Gravida  2   Para  2   Term  2   Preterm  0   AB  0   Living  2     SAB  0   IAB  0   Ectopic  0   Multiple  0   Live Births  2            Home Medications    Prior to Admission medications   Medication Sig Start Date End Date Taking? Authorizing Provider  amoxicillin-clavulanate (AUGMENTIN) 875-125 MG tablet Take 1 tablet by mouth every 12 (twelve) hours. 06/20/20  Yes Pearson Forster, NP  acetaminophen (TYLENOL) 325 MG tablet Take 1-2 tablets (  325-650 mg total) by mouth every 4 (four) hours as needed for mild pain. 03/04/20   Love, Ivan Anchors, PA-C  atorvastatin (LIPITOR) 40 MG tablet Take 1 tablet (40 mg total) by mouth daily. 04/15/20   Jamse Arn, MD  atorvastatin (LIPITOR) 40 MG tablet TAKE 1 TABLET (40 MG TOTAL) BY MOUTH DAILY. 03/05/20 03/05/21  Love, Ivan Anchors, PA-C  atorvastatin (LIPITOR) 40 MG tablet TAKE 1 TABLET BY MOUTH ONCE A DAY 04/15/20 04/15/21  Jamse Arn, MD  dabigatran (PRADAXA) 150 MG CAPS capsule Take 1 capsule (150 mg total) by mouth every 12 (twelve) hours. 04/15/20   Jamse Arn, MD  dabigatran (PRADAXA) 150 MG CAPS capsule TAKE 1 CAPSULE (150 MG TOTAL) BY MOUTH EVERY TWELVE HOURS. 03/04/20 03/04/21  Bary Leriche, PA-C  dabigatran (PRADAXA) 150 MG  CAPS capsule TAKE 1 CAPSULE BY MOUTH EVERY 12 HOURS 04/15/20 04/15/21  Jamse Arn, MD  docusate sodium (COLACE) 100 MG capsule Take 1 capsule (100 mg total) by mouth 2 (two) times daily. 03/05/20   Love, Ivan Anchors, PA-C  docusate sodium (COLACE) 100 MG capsule TAKE 1 CAPSULE (100 MG TOTAL) BY MOUTH TWO TIMES DAILY. 03/05/20 03/05/21  Love, Ivan Anchors, PA-C  levETIRAcetam (KEPPRA) 500 MG tablet Take 1 tablet (500 mg total) by mouth 2 (two) times daily. 04/15/20   Jamse Arn, MD  levETIRAcetam (KEPPRA) 500 MG tablet TAKE 1 TABLET (500 MG TOTAL) BY MOUTH TWO TIMES DAILY. 03/05/20 03/05/21  Love, Ivan Anchors, PA-C  levETIRAcetam (KEPPRA) 500 MG tablet TAKE 1 TABLET BY MOUTH 2 TIMES DAILY 04/15/20 04/15/21  Jamse Arn, MD  pantoprazole (PROTONIX) 40 MG tablet Take 1 tablet (40 mg total) by mouth daily. 04/15/20   Jamse Arn, MD  pantoprazole (PROTONIX) 40 MG tablet TAKE 1 TABLET (40 MG TOTAL) BY MOUTH DAILY. 03/05/20 03/05/21  Love, Ivan Anchors, PA-C  pantoprazole (PROTONIX) 40 MG tablet TAKE 1 TABLET BY MOUTH ONCE A DAY 04/15/20 04/15/21  Jamse Arn, MD  polyethylene glycol (MIRALAX / GLYCOLAX) 17 g packet Take 17 g by mouth daily. 03/06/20   Love, Ivan Anchors, PA-C  polyethylene glycol powder (GLYCOLAX/MIRALAX) 17 GM/SCOOP powder DISSOLVE 1 CAPFUL (17 G) IN WATE AND DRINK DAILY. 03/05/20 03/05/21  Bary Leriche, PA-C    Family History Family History  Problem Relation Age of Onset  . Sickle cell trait Mother   . Depression Mother   . Asthma Father   . Drug abuse Father        heroin abuse  . Kidney disease Father        due to heroin abuse  . Diabetes Maternal Grandmother     Social History Social History   Tobacco Use  . Smoking status: Former Research scientist (life sciences)  . Smokeless tobacco: Never Used  Substance Use Topics  . Alcohol use: No  . Drug use: No     Allergies   Patient has no known allergies.   Review of Systems Review of Systems  HENT: Positive for sore throat and voice  change.   All other systems reviewed and are negative.    Physical Exam Triage Vital Signs ED Triage Vitals  Enc Vitals Group     BP 06/20/20 1557 124/86     Pulse Rate 06/20/20 1557 (!) 109     Resp 06/20/20 1557 16     Temp 06/20/20 1557 99.5 F (37.5 C)     Temp Source 06/20/20 1557 Oral  SpO2 06/20/20 1557 100 %     Weight --      Height --      Head Circumference --      Peak Flow --      Pain Score 06/20/20 1558 0     Pain Loc --      Pain Edu? --      Excl. in [place]? --    No data found.  Updated Vital Signs BP 124/86 (BP Location: Right Arm)   Pulse (!) 109   Temp 99.5 F (37.5 C) (Oral)   Resp 16   SpO2 100%   Visual Acuity Right Eye Distance:   Left Eye Distance:   Bilateral Distance:    Right Eye Near:   Left Eye Near:    Bilateral Near:     Physical Exam Vitals and nursing note reviewed.  Constitutional:      General: She is not in acute distress.    Appearance: Normal appearance. She is not ill-appearing, toxic-appearing or diaphoretic.  HENT:     Head: Normocephalic and atraumatic.     Nose: No congestion.     Mouth/Throat:     Mouth: Mucous membranes are moist.     Pharynx: Uvula midline. Pharyngeal swelling and posterior oropharyngeal erythema present. No oropharyngeal exudate or uvula swelling.     Tonsils: No tonsillar exudate. 0 on the right. 1+ on the left.  Eyes:     Conjunctiva/sclera: Conjunctivae normal.  Cardiovascular:     Rate and Rhythm: Normal rate.     Pulses: Normal pulses.  Pulmonary:     Effort: Pulmonary effort is normal.  Abdominal:     General: Abdomen is flat.  Musculoskeletal:        General: Normal range of motion.     Cervical back: Normal range of motion.  Skin:    General: Skin is warm and dry.  Neurological:     General: No focal deficit present.     Mental Status: She is alert and oriented to person, place, and time.  Psychiatric:        Mood and Affect: Mood normal.      UC Treatments / Results   Labs (all labs ordered are listed, but only abnormal results are displayed) Labs Reviewed  CULTURE, GROUP A STREP [facility])  POCT RAPID STREP A, ED / UC    EKG   Radiology No results found.  Procedures Procedures (including critical care time)  Medications Ordered in UC Medications - No data to display  Initial Impression / Assessment and Plan / UC Course  I have reviewed the triage vital signs and the nursing notes.  Pertinent labs & imaging results that were available during my care of the patient were reviewed by me and considered in my medical decision making (see chart for details).    Tonsillitis.  Rapid strep negative and throat culture pending.  Patient is in no apparent distress and handling secretions.  Will treat with Augmentin twice a day for the next 7 days. Discussed conservative symptom management as described in discharge instructions.  Strict ED follow up for any worsening symptoms, difficulty swallowing, or difficulty breathing.  Final Clinical Impressions(s) / UC Diagnoses   Final diagnoses:  Acute tonsillitis, unspecified etiology     Discharge Instructions     Take the Augmentin twice a day for the next 7 days.    You can take Tylenol and/or Ibuprofen as needed for fever reduction and pain relief.   For  sore throat: try warm salt water gargles, cepacol lozenges, throat spray, warm tea or water with lemon/honey, popsicles or ice, or OTC cold relief medicine for throat discomfort.    It is important to stay hydrated: drink plenty of fluids (water, gatorade/powerade/pedialyte, juices, or teas) to keep your throat moisturized and help further relieve irritation/discomfort.   Return or go to the Emergency Department if symptoms worsen or do not improve in the next few days.     ED Prescriptions    Medication Sig Dispense Auth. Provider   amoxicillin-clavulanate (AUGMENTIN) 875-125 MG tablet Take 1 tablet by mouth every 12 (twelve) hours. 14 tablet  Pearson Forster, NP     PDMP not reviewed this encounter.   Pearson Forster, NP 06/20/20 [phone]

## 2020-06-21 ENCOUNTER — Telehealth (HOSPITAL_COMMUNITY): Payer: Self-pay | Admitting: Emergency Medicine

## 2020-06-21 ENCOUNTER — Telehealth (HOSPITAL_COMMUNITY): Payer: Self-pay | Admitting: Physician Assistant

## 2020-06-21 ENCOUNTER — Telehealth (HOSPITAL_COMMUNITY): Payer: Self-pay | Admitting: Medical

## 2020-06-21 LAB — RESP PANEL BY RT-PCR (FLU A&B, COVID) ARPGX2
Influenza A by PCR: NEGATIVE
Influenza B by PCR: NEGATIVE
SARS Coronavirus 2 by RT PCR: NEGATIVE

## 2020-06-21 MED ORDER — METHYLPREDNISOLONE 4 MG PO TBPK: ORAL_TABLET | ORAL | 0 refills | Status: DC

## 2020-06-21 MED ORDER — ONDANSETRON 4 MG PO TBDP: ORAL_TABLET | ORAL | 0 refills | Status: DC

## 2020-06-21 MED ORDER — CLINDAMYCIN HCL 150 MG PO CAPS: 450.0000 mg | ORAL_CAPSULE | Freq: Three times a day (TID) | ORAL | 0 refills | Status: DC

## 2020-06-21 MED ORDER — CLINDAMYCIN HCL 300 MG PO CAPS
450.0000 mg | ORAL_CAPSULE | Freq: Once | ORAL | Status: AC
  Administered 2020-06-21: 450 mg via ORAL
  Filled 2020-06-21: qty 1

## 2020-06-21 NOTE — Discharge Instructions (Signed)
Take tylenol 2 pills 4 times a day and motrin 4 pills 3 times a day.  Drink plenty of fluids.  Return for worsening shortness of breath, headache, confusion. Follow up with your family doctor.   It is important to continue eating and drinking.  Please return for sensation of difficulty breathing if you are unable to swallow at all.  Please follow-up with ENT in the office.

## 2020-06-21 NOTE — Telephone Encounter (Signed)
Patient is Medrol Dosepak reportedly did not send to [employer].  I have resent this.

## 2020-06-21 NOTE — Telephone Encounter (Signed)
Pt requested prescriptions be sent to a 24 hour pharmacy as her pharmacy is closed for [facility]. Prescriptions have been sent to pharmacy of her choosing.

## 2020-06-21 NOTE — Telephone Encounter (Signed)
Medrol dose pak called to different pharmacy.

## 2020-06-21 NOTE — ED Provider Notes (Signed)
I received the patient in signout from Dr. Joya Gaskins, briefly the patient is a 35 year old female with a chief complaint of a sore throat.  Was seen earlier today and has developed some vomiting.  On initial exam there is some concern for a peritonsillar abscess and so a CT scan was ordered.  Plan to follow-up CT and blood work.  CT scan has resulted with some concern for epiglottitis versus supraglottitis.  On my exam the patient is tolerating her secretions without difficulty I do not appreciate any uvula deviation.  She is able to rotate her head without issue.  No difficulty breathing or swallowing here.  I discussed the case with ENT on-call, Dr. Isaias Cowman, recommended if the patient was clinically well-appearing and had no significant signs of airway compromise to change antibiotics to clindamycin and started on a Medrol Dosepak and have her follow-up as an outpatient.  Strict return precautions are given.   Deno Etienne, DO 06/21/20 [phone]

## 2020-06-23 LAB — CULTURE, GROUP A STREP (THRC)

## 2020-07-08 ENCOUNTER — Encounter: Payer: Self-pay | Admitting: Neurology

## 2020-07-08 ENCOUNTER — Inpatient Hospital Stay: Payer: Self-pay | Admitting: Neurology

## 2020-08-13 ENCOUNTER — Other Ambulatory Visit: Payer: Self-pay | Admitting: Physical Medicine & Rehabilitation

## 2020-08-13 ENCOUNTER — Other Ambulatory Visit: Payer: Self-pay | Admitting: Physical Medicine and Rehabilitation

## 2020-08-13 ENCOUNTER — Other Ambulatory Visit: Payer: Self-pay

## 2020-08-13 MED FILL — Pantoprazole Sodium EC Tab 40 MG (Base Equiv): ORAL | 30 days supply | Qty: 30 | Fill #0 | Status: CN

## 2020-08-16 ENCOUNTER — Other Ambulatory Visit: Payer: Self-pay | Admitting: Physical Medicine & Rehabilitation

## 2020-08-16 ENCOUNTER — Other Ambulatory Visit: Payer: Self-pay

## 2020-08-17 ENCOUNTER — Telehealth: Payer: Self-pay | Admitting: Neurology

## 2020-08-17 NOTE — Telephone Encounter (Signed)
Pt states Dr [name] put put her on these medications when he saw her in the hospital:atorvastatin (LIPITOR) 40 MG tablet, dabigatran (PRADAXA) 150 MG CAPS capsule, docusate sodium (COLACE) 100 MG capsule, levETIRAcetam (KEPPRA) 500 MG tablet, pantoprazole (PROTONIX) 40 MG tablet & polyethylene glycol (MIRALAX / GLYCOLAX) 17 g packet & acetaminophen (TYLENOL) 325 MG tablet .  Pt is asking for a call to discuss her not being able to get these thru her pharmacy.  Please call

## 2020-08-17 NOTE — Telephone Encounter (Signed)
Pt has not been seen yet in the clinic post stroke in January.  Pt sts she needs refills of the Pradaxa, Colace, Keppra, Protonix, Miralax and Tylenol. Dr. Posey Pronto with Rehab has refilled meds in the past. Pt is on the schedule for f/u with [facility] 10/13/20. Will ask Dr. [name] about refills.

## 2020-08-18 ENCOUNTER — Other Ambulatory Visit: Payer: Self-pay

## 2020-08-18 MED ORDER — PANTOPRAZOLE SODIUM 40 MG PO TBEC
40.0000 mg | DELAYED_RELEASE_TABLET | Freq: Every day | ORAL | 0 refills | Status: DC
  Filled 2020-08-18 – 2020-08-19 (×2): qty 30, 30d supply, fill #0

## 2020-08-18 MED ORDER — LEVETIRACETAM 500 MG PO TABS
ORAL_TABLET | Freq: Two times a day (BID) | ORAL | 0 refills | Status: DC
  Filled 2020-08-18: qty 60, 30d supply, fill #0

## 2020-08-18 MED ORDER — DABIGATRAN ETEXILATE MESYLATE 150 MG PO CAPS
150.0000 mg | ORAL_CAPSULE | Freq: Two times a day (BID) | ORAL | 0 refills | Status: DC
  Filled 2020-08-18: qty 60, 30d supply, fill #0

## 2020-08-18 NOTE — Telephone Encounter (Signed)
[facility] called, patient told us prescriptions was going to be sent to [place] . We have not received any prescriptions. Would like a call from the nurse.

## 2020-08-18 NOTE — Addendum Note (Signed)
Addended by: Verlin Grills on: 08/18/2020 04:32 PM   Modules accepted: Orders

## 2020-08-18 NOTE — Telephone Encounter (Addendum)
Per Dr. [name], ok to refill meds x1 but after pt will need to establish with PCP for continuing refills.   I called the pt and left a vm asking her to call back, need to verify what pharmacy needs reills and to educate that tylenol, colace and miralax all can be purchased over the counter. Phone staff can relay this information along with obtain the pharmacy for other meds to be sent to.

## 2020-08-18 NOTE — Telephone Encounter (Signed)
1 time refill for Protonix, Pradaxa, and Keppra have been sent.  Per Dr. [name] further refills to come from PCP. Tylenol, Miralax and Colace can be purchased over the counter.

## 2020-08-19 ENCOUNTER — Other Ambulatory Visit: Payer: Self-pay

## 2020-08-26 ENCOUNTER — Other Ambulatory Visit: Payer: Self-pay

## 2020-10-13 ENCOUNTER — Encounter: Payer: Self-pay | Admitting: Neurology

## 2020-10-13 ENCOUNTER — Ambulatory Visit: Payer: Medicaid Other | Admitting: Neurology

## 2020-10-13 ENCOUNTER — Telehealth: Payer: Self-pay | Admitting: Neurology

## 2020-10-13 VITALS — BP 106/72 | HR 90 | Ht 60.0 in | Wt 156.5 lb

## 2020-10-13 DIAGNOSIS — Z86718 Personal history of other venous thrombosis and embolism: Secondary | ICD-10-CM

## 2020-10-13 DIAGNOSIS — Z8673 Personal history of transient ischemic attack (TIA), and cerebral infarction without residual deficits: Secondary | ICD-10-CM

## 2020-10-13 DIAGNOSIS — G40109 Localization-related (focal) (partial) symptomatic epilepsy and epileptic syndromes with simple partial seizures, not intractable, without status epilepticus: Secondary | ICD-10-CM

## 2020-10-13 MED ORDER — LEVETIRACETAM 500 MG PO TABS: 500.0000 mg | ORAL_TABLET | Freq: Two times a day (BID) | ORAL | 11 refills | Status: DC

## 2020-10-13 MED ORDER — ASPIRIN EC 81 MG PO TBEC: 81.0000 mg | DELAYED_RELEASE_TABLET | Freq: Every day | ORAL | 11 refills | Status: DC

## 2020-10-13 NOTE — Progress Notes (Signed)
[facility] [address] [place]. [place] [address] [phone]       OFFICE FOLLOW-UP NOTE  Ms. 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  History is obtained from patient and review of electronic medical records and personally reviewed pertinent imaging films in PACS.  She has past medical history of sickle cell trait, diabetes and asthma.  She presented initially with Altered mental status on 02/12/20 following one week of GI symptoms. CT head was obtained revealing a small volume subarachnoid hemorrhage and hyperdense dural sinuses with a filling defect concerning for dural venous thrombosis. 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 IR was consulted for thrombectomy. S/p thrombectomy on 02/13/20 where large chunks of clot removed with revascularization of the TS bilaterally and partially the RT SS and the torcula. Pt was intubated then transferred to Neuro ICU .Repeat MRI Brain showed progressive left perirolandic venous infarction with petechial hemorrhage and new edema developing now in the deep gray nuclei but without infarction. MRV after procedure showed improved flow in the deep venous system and proximal right transverse sinus. Hypercoagulable panel revealed Decreased protein S at 68 and will have to be repeated at 6-8 weeks. Protein C total and activity as well as Antithrombin III levels and lupus anticoagulants were all normal.  Beta-2 glycoprotein and homocystine were also normal. Pt was started on IV heparin initially and treated with aggressive IV  fluids.Repeat CT Head scan does not show any acute abnormality. Pt had focal seizure on 02/12/20. EEG on 02/12/20 showed 2-3 Hz rhythmic  delta slowing in bitemporal region. She was started on Keppra 1000 mg BID which was reduced later to 500 mg BID. Repeat EEG on 02/16/20 suggestive of moderate to severe diffuse encephalopathy, nonspecific etiology. Negative for seizure or epileptiform discharges.  Her Hb dropped to 7 after procedure which may be related to repeated procedure and IV heparin. Pt received 1 Units of PRBC on 02/16/20. Pt blood pressure controlled with Phenylephrine, Electrolytes were monitored and replaced accordingly. Pt was started on Unasyn on 02/18/20 for ?Mastoiditis on MRI. Pt extubated on 02/17/20. Pt tolerated procedure well. Hypertonic saline stopped and started her on D5 with NS. Repeat MRV Head showed significant improvement of flow within superior sagittal sinus since the prior MRV of 02/15/2020 and persistent foci of nonocclusive thrombus within this vessel.  Pt was transferred to progressive unit.  Hematocrit stable. Pt transitioned to oral diet and IV fluids discontinued on 02/26/20. Heparin changed to Pradaxa. Lipitor was started. Unasyn was stopped on 02/27/20. PT /OT suggest CIR. Pt was transferred to inpatient rehab and subsequently was discharged home.  She finished outpatient physical Occupational Therapy and has been home for the last 7 months.  Patient states that she ran out of Pradaxa and did not have insurance and could not afford to buy it and also stop Keppra and oral medications.  She has done remarkably well without any recurrent seizures and in fact has been able to start a new job and has been driving.  She is currently involved in a custody battle with her husband for her kids.  She denies any headache, blurred vision, extremity weakness numbness gait or balance problems.  She has had no recurrent focal seizures either.  She denies any  family history of DVT, pulmonary embolism or strokes or heart attacks in a young age  ROS:   84 system review of systems is positive for memory difficulties only all other  systems negative  PMH:  Past Medical History:  Diagnosis Date   Asthma    uses inhaler prn   Diabetes mellitus without complication ([place])    Sickle cell trait (Jill Clarke)    Stroke (Jill Clarke)    Unspecified high-risk pregnancy 01/27/2011    Social History:  Social History   Socioeconomic History   Marital status: Divorced    Spouse name: Not on file   Number of children: Not on file   Years of education: Not on file   Highest education level: Not on file  Occupational History   Not on file  Tobacco Use   Smoking status: Former   Smokeless tobacco: Never  Substance and Sexual Activity   Alcohol use: No   Drug use: No   Sexual activity: Yes    Birth control/protection: Implant  Other Topics Concern   Not on file  Social History Narrative   Lives with mother and 2 kids   Left Handed   Drinks 1-2 cups caffeine daily   Social Determinants of Health   Financial Resource Strain: Not on file  Food Insecurity: Not on file  Transportation Needs: Not on file  Physical Activity: Not on file  Stress: Not on file  Social Connections: Not on file  Intimate Partner Violence: Not on file    Medications:   Current Outpatient Medications on File Prior to Visit  Medication Sig Dispense Refill   acetaminophen (TYLENOL) 500 MG tablet Take 500-1,000 mg by mouth every 6 (six) hours as needed.     No current facility-administered medications on file prior to visit.    Allergies:  No Known Allergies  Physical Exam General: Obese young African-American lady, seated, in no evident distress Head: head normocephalic and atraumatic.  Neck: supple with no carotid or supraclavicular bruits Cardiovascular: regular rate and rhythm, no murmurs Musculoskeletal: no deformity Skin:  no rash/petichiae Vascular:  Normal pulses all extremities Vitals:   10/13/20 0818  BP: 106/72  Pulse: 90   Neurologic Exam Mental Status: Awake and fully alert. Oriented to place and time. Recent and remote  memory intact. Attention span, concentration and fund of knowledge appropriate. Mood and affect appropriate.  Initial recall 2/3.  Able to name 11 animals which can walk on 4 legs.  Clock drawing 4/4. Cranial Nerves: Fundoscopic exam reveals sharp disc margins. Pupils equal, briskly reactive to light. Extraocular movements full without nystagmus. Visual fields full to confrontation. Hearing intact. Facial sensation intact. Face, tongue, palate moves normally and symmetrically.  Motor: Normal bulk and tone. Normal strength in all tested extremity muscles. Sensory.: intact to touch ,pinprick .position and vibratory sensation.  Coordination: Rapid alternating movements normal in all extremities. Finger-to-nose and heel-to-shin performed accurately bilaterally. Gait and Station: Arises from chair without difficulty. Stance is normal. Gait demonstrates normal stride length and balance . Able to heel, toe and tandem walk without difficulty.  Reflexes: 1+ and symmetric. Toes downgoing.   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  No obvious risk factors except possible dehydration from preceding GI illness.  Protein S activity was marginally low and  will repeat hypercoagulable panel now     PLAN: I had a long discussion with the patient with regards to her prolonged hospitalization for cerebral venous sinus thrombosis and venous infarct as well as partial seizures and answered questions.  She had discontinued anticoagulation and seizure medications as she had no insurance and could not afford the medicines.  Since it has been 8 months I am not so sure she still needs anticoagulation.  Recommend check MRI scan of the brain with MR venogram and EEG.  Start  aspirin 81 mg daily and Keppra 500 mgtwice daily.  Repeat hypercoagulable panel labs and if abnormal she may need to go back on anticoagulation.  Patient has done quite well and is hardly any deficits and may return back to work without restrictions.  Return for follow-up in 3 months with my nurse practitioner [name] or call earlier if necessary Greater than 50% of time during this 35 minute visit was spent on counseling,explanation of diagnosis cerebral venous sinus thrombosis, venous infarction and seizures, planning of further management, discussion with patient and family and coordination of care Antony Contras, MD Note: This document was prepared with digital dictation and possible smart phrase technology. Any transcriptional errors that result from this process are unintentional

## 2020-10-13 NOTE — Patient Instructions (Signed)
I had a long discussion with the patient with regards to her prolonged hospitalization for cerebral venous sinus thrombosis and venous infarct as well as partial seizures and answered questions.  She had discontinued anticoagulation and seizure medications as she had no insurance and could not afford the medicines.  Since it has been 8 months I am not so sure she still needs anticoagulation.  Recommend check MRI scan of the brain with MR venogram and EEG.  Start aspirin 81 mg daily and Keppra 500 twice daily.  Repeat hypercoagulable panel labs and if abnormal she may need to go back on anticoagulation.  Patient has done quite well and is hardly any deficits and may return back to work without restrictions.  Return for follow-up in 3 months with my nurse practitioner [name] or call earlier if necessary

## 2020-10-13 NOTE — Telephone Encounter (Signed)
EEG order sent to [facility] for scheduling.

## 2020-10-20 ENCOUNTER — Telehealth: Payer: Self-pay | Admitting: Neurology

## 2020-10-20 NOTE — Telephone Encounter (Signed)
[facility] pending faxed notes

## 2020-10-22 NOTE — Telephone Encounter (Signed)
[facility] Josem Kaufmann: [id number] (exp. 10/20/20 to 11/19/20) [id number] (exp. 10/20/20 to 11/19/20)  Order sent to GI, they will reach out to the patient to schedule.

## 2020-10-25 LAB — HYPERCOAGULABLE PANEL, COMPREHENSIVE
APTT: 27.5 s
AT III Act/Nor PPP Chro: 116 %
Act. Prt C Resist w/FV Defic.: 2.5 ratio
Anticardiolipin Ab, IgG: <10 [GPL'U]
Anticardiolipin Ab, IgM: 14 [MPL'U]
Beta-2 Glycoprotein I, IgA: <10 SAU
Beta-2 Glycoprotein I, IgG: <10 SGU
Beta-2 Glycoprotein I, IgM: <10 SMU
DRVVT Screen Seconds: 29 s
Factor VII Antigen**: 106 %
Factor VIII Activity: 101 %
Hexagonal Phospholipid Neutral: 0 s
Homocysteine: 6.4 umol/L
Prot C Ag Act/Nor PPP Imm: 82 %
Prot S Ag Act/Nor PPP Imm: 67 %
Protein C Ag/FVII Ag Ratio**: 0.8 ratio
Protein S Ag/FVII Ag Ratio**: 0.6 ratio

## 2020-10-26 ENCOUNTER — Telehealth: Payer: Self-pay | Admitting: *Deleted

## 2020-10-26 NOTE — Telephone Encounter (Signed)
-----   Message from Garvin Fila, MD sent at 10/26/2020  8:41 AM EDT ----- Jill Clarke inform the patient that hypercoagulable panel lab work to look for abnormal reasons for blood clotting was normal ----- Message ----- From: Interface, [facility] Lab Results In Sent: 10/25/2020  11:36 AM EDT To: Garvin Fila, MD

## 2020-10-26 NOTE — Telephone Encounter (Signed)
I spoke to patient and she verbalized understanding of the lab results.

## 2020-11-02 ENCOUNTER — Ambulatory Visit: Payer: Medicaid Other | Admitting: Neurology

## 2020-11-02 DIAGNOSIS — G40109 Localization-related (focal) (partial) symptomatic epilepsy and epileptic syndromes with simple partial seizures, not intractable, without status epilepticus: Secondary | ICD-10-CM | POA: Diagnosis not present

## 2020-11-02 DIAGNOSIS — Z8673 Personal history of transient ischemic attack (TIA), and cerebral infarction without residual deficits: Secondary | ICD-10-CM

## 2020-11-06 ENCOUNTER — Ambulatory Visit
Admission: RE | Admit: 2020-11-06 | Discharge: 2020-11-06 | Disposition: A | Discharge status: Home / Self Care | Payer: Medicaid Other | Source: Ambulatory Visit | Attending: Neurology | Admitting: Neurology

## 2020-11-06 ENCOUNTER — Other Ambulatory Visit: Payer: Self-pay

## 2020-11-06 DIAGNOSIS — Z8673 Personal history of transient ischemic attack (TIA), and cerebral infarction without residual deficits: Secondary | ICD-10-CM

## 2020-11-06 MED ORDER — GADOBENATE DIMEGLUMINE 529 MG/ML IV SOLN
15.0000 mL | Freq: Once | INTRAVENOUS | Status: AC | PRN
  Administered 2020-11-06: 15 mL via INTRAVENOUS

## 2020-11-09 ENCOUNTER — Telehealth: Payer: Self-pay | Admitting: *Deleted

## 2020-11-09 NOTE — Telephone Encounter (Signed)
-----   Message from Garvin Fila, MD sent at 11/09/2020  9:09 AM EDT ----- [name] inform the patient that brainwave study was normal and did not show any irritability or seizure activity. ----- Message ----- From: Garvin Fila, MD Sent: 11/08/2020  12:49 PM EDT To: Garvin Fila, MD

## 2020-11-09 NOTE — Telephone Encounter (Signed)
I spoke to the patient and she verbalized understanding of the findings.

## 2020-11-15 ENCOUNTER — Telehealth: Payer: Self-pay

## 2020-11-15 NOTE — Telephone Encounter (Signed)
I called patient. I discussed her MRI results. Pt verbalized understanding of results. Pt had no questions at this time but was encouraged to call back if questions arise.

## 2020-11-15 NOTE — Telephone Encounter (Signed)
-----   Message from Garvin Fila, MD sent at 11/13/2020  1:04 PM EDT ----- [name] inform the patient that MR venogram study of the brain shows a lot of improvement of the blood flow in the previously clot cerebral venous sinuses on the top and the back portion of the brain.  These are expected findings.  Nothing to worry about ----- Message ----- From: Garvin Fila, MD Sent: 11/12/2020   9:30 AM EDT To: Garvin Fila, MD

## 2020-11-15 NOTE — Telephone Encounter (Signed)
-----   Message from Garvin Fila, MD sent at 11/13/2020  1:03 PM EDT ----- [name] inform the patient that MRI scan of the brain with and without dye injection shows old strokes on the left side towards the top and in the deep portions with expected evolutionary changes compared to the previous MRI scan with improvement in the blood flow in her previously blocked cerebral venous sinuses.  Nothing to worry about ----- Message ----- From: Garvin Fila, MD Sent: 11/12/2020   9:23 AM EDT To: Garvin Fila, MD

## 2020-11-15 NOTE — Telephone Encounter (Signed)
I called patient. I discussed her MRV results with her. Pt verbalized understanding of results. Pt had no questions at this time but was encouraged to call back if questions arise.

## 2021-02-02 ENCOUNTER — Encounter: Payer: Self-pay | Admitting: Adult Health

## 2021-02-02 ENCOUNTER — Ambulatory Visit: Payer: Medicaid Other | Admitting: Adult Health

## 2021-02-02 NOTE — Progress Notes (Deleted)
[facility] [address] [place]. [place] [address] [phone]       OFFICE FOLLOW-UP NOTE  Jill Clarke Date of Birth:  Feb 21, 1985 Medical Record Number:  [id number]   HPI:   Update 02/02/2021, JM: Jill Clarke is here today for a stroke and seizure follow-up accompanied by ***. *** new stroke/TIA symptoms. *** seizure activity. Remains on Aspirin and Keppra ***. BP today ***. Labs A1c 4.9, LDL 108 (02/13/2020). PCP ***  Repeat studies: MRI 11/06/2020: Compared to previous MRI from January 2022 expected evolutionary changes are noted in the multiple cerebral infarcts as well as there is improved flow in the superior sagittal, transverse and sigmoid sinuses. MRV 11/06/2020: MR venogram of the brain showing partial nonobstructive filling defects in posterior portion of the superior sagittal sinus and bilateral transverse sinuses and right sigmoid sinus and jugular vein due to partial recannalization of previous cerebral venous thrombosis but overall much improved compared with previous MR venogram from 02/18/2020 EEG 11/08/2020: Normal electroencephalogram, awake, asleep and with activation procedures. There are no focal lateralizing or epileptiform features. Hypercoag panel 10/13/2020: normal    Initial visit 10/13/2020, Dr. 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  History is obtained from patient and review of electronic medical records and personally reviewed pertinent imaging films in PACS.  She has past medical history of sickle cell trait, diabetes and asthma.  She presented initially with Altered mental status on 02/12/20 following one week of GI symptoms. CT head was obtained revealing a small volume subarachnoid hemorrhage and hyperdense dural sinuses with a filling defect concerning for dural venous thrombosis. 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 IR was consulted for thrombectomy. S/p thrombectomy on 02/13/20 where large chunks of clot removed with revascularization of the TS bilaterally and partially the RT SS and the torcula. Pt was intubated then transferred to Neuro ICU .Repeat MRI Brain showed progressive left perirolandic venous infarction with petechial hemorrhage and new edema developing now in the deep gray nuclei but without infarction. MRV after procedure showed improved flow in the deep venous system and proximal right transverse sinus. Hypercoagulable panel revealed Decreased protein S at 68 and will have to be repeated at 6-8 weeks. Protein C total and activity as well as Antithrombin III levels and lupus anticoagulants were all normal.  Beta-2 glycoprotein and homocystine were also normal. Pt was started on IV heparin initially and treated with aggressive IV  fluids.Repeat CT Head scan does not show any acute abnormality. Pt had focal seizure on 02/12/20. EEG on 02/12/20 showed 2-3 Hz rhythmic delta slowing in bitemporal region. She was started on Keppra 1000 mg BID which was reduced later to 500 mg BID. Repeat EEG on 02/16/20 suggestive of moderate to severe diffuse encephalopathy, nonspecific etiology. Negative for seizure or epileptiform discharges.  Her Hb dropped to 7 after procedure which may be related to repeated procedure and IV heparin. Pt received 1 Units of PRBC on 02/16/20. Pt blood pressure controlled with Phenylephrine, Electrolytes were monitored and replaced accordingly. Pt was started on Unasyn on 02/18/20 for ?Mastoiditis on MRI. Pt extubated on 02/17/20. Pt tolerated procedure well. Hypertonic saline stopped and started her on D5 with NS. Repeat MRV Head showed significant improvement of flow within superior sagittal sinus since the prior MRV of 02/15/2020 and persistent foci of nonocclusive thrombus within  this vessel.  Pt was  transferred to progressive unit.  Hematocrit stable. Pt transitioned to oral diet and IV fluids discontinued on 02/26/20. Heparin changed to Pradaxa. Lipitor was started. Unasyn was stopped on 02/27/20. PT /OT suggest CIR. Pt was transferred to inpatient rehab and subsequently was discharged home.  She finished outpatient physical Occupational Therapy and has been home for the last 7 months.  Patient states that she ran out of Pradaxa and did not have insurance and could not afford to buy it and also stop Keppra and oral medications.  She has done remarkably well without any recurrent seizures and in fact has been able to start a new job and has been driving.  She is currently involved in a custody battle with her husband for her kids.  She denies any headache, blurred vision, extremity weakness numbness gait or balance problems.  She has had no recurrent focal seizures either.  She denies any family history of DVT, pulmonary embolism or strokes or heart attacks in a young age  ROS:   33 system review of systems is positive for memory difficulties only all other systems negative  PMH:  Past Medical History:  Diagnosis Date   Asthma    uses inhaler prn   Diabetes mellitus without complication ([place])    Sickle cell trait ([place])    Stroke ([place])    Unspecified high-risk pregnancy 01/27/2011    Social History:  Social History   Socioeconomic History   Marital status: Divorced    Spouse name: Not on file   Number of children: Not on file   Years of education: Not on file   Highest education level: Not on file  Occupational History   Not on file  Tobacco Use   Smoking status: Former   Smokeless tobacco: Never  Substance and Sexual Activity   Alcohol use: No   Drug use: No   Sexual activity: Yes    Birth control/protection: Implant  Other Topics Concern   Not on file  Social History Narrative   Lives with mother and 2 kids   Left Handed   Drinks 1-2 cups caffeine daily   Social  Determinants of Health   Financial Resource Strain: Not on file  Food Insecurity: Not on file  Transportation Needs: Not on file  Physical Activity: Not on file  Stress: Not on file  Social Connections: Not on file  Intimate Partner Violence: Not on file    Medications:   Current Outpatient Medications on File Prior to Visit  Medication Sig Dispense Refill   acetaminophen (TYLENOL) 500 MG tablet Take 500-1,000 mg by mouth every 6 (six) hours as needed.     aspirin EC 81 MG tablet Take 1 tablet (81 mg total) by mouth daily. Swallow whole. 30 tablet 11   levETIRAcetam (KEPPRA) 500 MG tablet Take 1 tablet (500 mg total) by mouth 2 (two) times daily. 120 tablet 11   No current facility-administered medications on file prior to visit.    Allergies:  No Known Allergies  Physical Exam General: Obese young African-American lady, seated, in no evident distress Head: head normocephalic and atraumatic Neck: supple with no carotid or supraclavicular bruits Cardiovascular: regular rate and rhythm, no murmurs Musculoskeletal: no deformity Skin:  no rash/petichiae Vascular:  Normal pulses all extremities There were no vitals filed for this visit.  Neurologic Exam Mental Status: Awake and fully alert. Oriented to place and time. Recent and remote memory intact. Attention span, concentration and fund of knowledge  appropriate. Mood and affect appropriate.  Initial recall 2/3.  Able to name 11 animals which can walk on 4 legs.  Clock drawing 4/4. Cranial Nerves: Fundoscopic exam reveals sharp disc margins. Pupils equal, briskly reactive to light. Extraocular movements full without nystagmus. Visual fields full to confrontation. Hearing intact. Facial sensation intact. Face, tongue, palate moves normally and symmetrically.  Motor: Normal bulk and tone. Normal strength in all tested extremity muscles. Sensory: intact to touch, pinprick, position and vibratory sensation.  Coordination: Rapid  alternating movements normal in all extremities. Finger-to-nose and heel-to-shin performed accurately bilaterally. Gait and Station: Arises from chair without difficulty. Stance is normal. Gait demonstrates normal stride length and balance . Able to heel, toe and tandem walk without difficulty.  Reflexes: 1+ and symmetric. Toes downgoing.   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  No obvious risk factors except possible dehydration from preceding GI illness.  Protein S activity was marginally low and will repeat hypercoagulable panel now  CVST Repeat MRV 10/2020 improved flow Repeat MRI 10/2020 expected evolutionary changes EEG 10/2020 no seizure activity Hypercoag labs 09/2020 normal History of Stroke Continue Aspirin for secondary stroke prevention HLD: LDL goal <70, prior LDL 108 (01/2020), not currently on statin, repeat lipids today, if LDL >70 start statin DM: A1c goal <7, prior A1c 4.9 *** Encouraged close PCP follow up for secondary risk factor management   PLAN: I had a long discussion with the patient with regards to her prolonged hospitalization for cerebral venous sinus thrombosis and venous infarct as well as partial seizures and answered questions.  She had discontinued anticoagulation and seizure medications as she had no insurance and could not afford the medicines.  Since it has been 8 months I am not so sure she still needs anticoagulation.  Recommend check MRI scan of the brain with MR venogram and EEG.  Start aspirin 81 mg daily and Keppra 500 mgtwice daily.  Repeat hypercoagulable panel labs and if abnormal she may need to go back on anticoagulation.  Patient has done quite well and is hardly any deficits and may return  back to work without restrictions.  Return for follow-up in 3 months with my nurse practitioner [name] or call earlier if necessary Greater than 50% of time during this 35 minute visit was spent on counseling,explanation of diagnosis cerebral venous sinus thrombosis, venous infarction and seizures, planning of further management, discussion with patient and family and coordination of care Antony Contras, MD Note: This document was prepared with digital dictation and possible smart phrase technology. Any transcriptional errors that result from this process are unintentional

## 2021-02-07 ENCOUNTER — Ambulatory Visit: Payer: Medicaid Other | Admitting: Adult Health

## 2021-02-07 ENCOUNTER — Other Ambulatory Visit: Payer: Self-pay

## 2021-02-07 ENCOUNTER — Encounter: Payer: Self-pay | Admitting: Adult Health

## 2021-02-07 VITALS — BP 120/82 | HR 93 | Ht 60.0 in | Wt 157.0 lb

## 2021-02-07 DIAGNOSIS — R569 Unspecified convulsions: Secondary | ICD-10-CM

## 2021-02-07 DIAGNOSIS — E785 Hyperlipidemia, unspecified: Secondary | ICD-10-CM

## 2021-02-07 DIAGNOSIS — Z9189 Other specified personal risk factors, not elsewhere classified: Secondary | ICD-10-CM

## 2021-02-07 DIAGNOSIS — I6330 Cerebral infarction due to thrombosis of unspecified cerebral artery: Secondary | ICD-10-CM | POA: Diagnosis not present

## 2021-02-07 DIAGNOSIS — I639 Cerebral infarction, unspecified: Secondary | ICD-10-CM

## 2021-02-07 DIAGNOSIS — F411 Generalized anxiety disorder: Secondary | ICD-10-CM | POA: Diagnosis not present

## 2021-02-07 DIAGNOSIS — R413 Other amnesia: Secondary | ICD-10-CM

## 2021-02-07 DIAGNOSIS — Z86718 Personal history of other venous thrombosis and embolism: Secondary | ICD-10-CM | POA: Diagnosis not present

## 2021-02-07 DIAGNOSIS — Z8673 Personal history of transient ischemic attack (TIA), and cerebral infarction without residual deficits: Secondary | ICD-10-CM

## 2021-02-07 MED ORDER — ASPIRIN EC 81 MG PO TBEC: 81.0000 mg | DELAYED_RELEASE_TABLET | Freq: Every day | ORAL | 11 refills | Status: DC

## 2021-02-07 NOTE — Patient Instructions (Addendum)
Start  aspirin 81 mg daily  for secondary stroke prevention - very important to take life long as you have had prior strokes and taking this daily will help reduce risk of recurrent strokes  You will be called to schedule initial evaluation with our sleep clinic for possible sleep apnea  You will be called to establish care with a PCP (primary care doctor)  Repeat lipid panel today - if your LDL or bad cholesterol remains above goal of 70, it will be recommended to start statin therapy for further stroke prevention measures   Okay to hold off on restarting Keppra but please monitor for any potential seizure activity  Continue to follow up with PCP regarding cholesterol management  Maintain strict control of cholesterol with LDL cholesterol (bad cholesterol) goal below 70 mg/dL.   Signs of a Stroke? Follow the BEFAST method:  Balance Watch for a sudden loss of balance, trouble with coordination or vertigo Eyes Is there a sudden loss of vision in one or both eyes? Or double vision?  Face: Ask the person to smile. Does one side of the face droop or is it numb?  Arms: Ask the person to raise both arms. Does one arm drift downward? Is there weakness or numbness of a leg? Speech: Ask the person to repeat a simple phrase. Does the speech sound slurred/strange? Is the person confused ? Time: If you observe any of these signs, call 911.        Thank you for coming to see [place] at [facility]. I hope we have been able to provide you high quality care today.  You may receive a patient satisfaction survey over the next few weeks. We would appreciate your feedback and comments so that we may continue to improve ourselves and the health of our patients.

## 2021-02-07 NOTE — Progress Notes (Signed)
[facility] [address] [place]. [place] [address] [phone]       OFFICE FOLLOW-UP NOTE  Jill Clarke Date of Birth:  [birth year] Medical Record Number:  [id number]    Reason for visit: hx of CVST    Chief Complaint  Patient presents with   Follow-up    RM 3 alone Pt is well and stable, no sz or new stroke symptoms.       HPI:   Update 02/07/2021 JM: Returns for follow-up after prior visit with Dr. Leonie Clarke approximately 4 months ago.  Stable from stroke standpoint.  Denies new or reoccurring stroke/TIA symptoms. Repeat MRV showed improvement of prior CVST and repeat hypercoag labs normal. She has not been taking aspirin nor keppra which was previously advised by Dr. Leonie Clarke - reports she was not aware of this recommendation.  She has not had any seizure activity since initial seizure 01/2020. Repeat EEG (10/2020) no evidence of seizure activity. She does c/o brain fog with short term memory difficulties, generalized anxiety, insomnia, daytime fatigue and snoring. She has not previously completed a sleep study. Currently in process of finding a PCP.  No further concerns at this time    History provided for reference purposes only Initial visit 10/13/2020 Dr. 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  History is obtained from patient and review of electronic medical records and personally reviewed pertinent imaging films in PACS.  She has past medical history of sickle cell trait, diabetes and asthma.  She presented initially with Altered mental status on 02/12/20 following one week of GI symptoms. CT head was obtained revealing a small volume subarachnoid hemorrhage and hyperdense dural sinuses with a filling defect concerning for dural venous thrombosis. MRI confirmed an acute, extensive dural venous sinus thrombosis  with deep watershed white matter infarcts. IR was consulted for thrombectomy. S/p thrombectomy on 02/13/20 where large chunks of clot removed with revascularization of the TS bilaterally and partially the RT SS and the torcula. Pt was intubated then transferred to Neuro ICU .Repeat MRI Brain showed progressive left perirolandic venous infarction with petechial hemorrhage and new edema developing now in the deep gray nuclei but without infarction. MRV after procedure showed improved flow in the deep venous system and proximal right transverse sinus. Hypercoagulable panel revealed Decreased protein S at 68 and will have to be repeated at 6-8 weeks. Protein C total and activity as well as Antithrombin III levels and lupus anticoagulants were all normal.  Beta-2 glycoprotein and homocystine were also normal. Pt was started on IV heparin initially and treated with aggressive IV  fluids.Repeat CT Head scan does not show any acute abnormality. Pt had focal seizure on 02/12/20. EEG on 02/12/20 showed 2-3 Hz rhythmic delta slowing in bitemporal region. She was started on Keppra 1000 mg BID which was reduced later to 500 mg BID. Repeat EEG on 02/16/20 suggestive of moderate to severe diffuse encephalopathy, nonspecific etiology. Negative for seizure or epileptiform discharges.  Her Hb dropped to 7 after procedure which may be related to repeated procedure and IV heparin. Pt received 1 Units of PRBC on 02/16/20. Pt blood pressure controlled with Phenylephrine, Electrolytes were monitored and replaced accordingly. Pt was started on Unasyn on 02/18/20 for ?Mastoiditis on MRI. Pt extubated on 02/17/20. Pt tolerated procedure well. Hypertonic saline stopped and started her on D5 with NS. Repeat MRV Head showed significant  improvement of flow within superior sagittal sinus since the prior MRV of 02/15/2020 and persistent foci of nonocclusive thrombus within this vessel.  Pt was transferred to progressive unit.  Hematocrit stable. Pt  transitioned to oral diet and IV fluids discontinued on 02/26/20. Heparin changed to Pradaxa. Lipitor was started. Unasyn was stopped on 02/27/20. PT /OT suggest CIR. Pt was transferred to inpatient rehab and subsequently was discharged home.  She finished outpatient physical Occupational Therapy and has been home for the last 7 months.  Patient states that she ran out of Pradaxa and did not have insurance and could not afford to buy it and also stop Keppra and oral medications.  She has done remarkably well without any recurrent seizures and in fact has been able to start a new job and has been driving.  She is currently involved in a custody battle with her husband for her kids.  She denies any headache, blurred vision, extremity weakness numbness gait or balance problems.  She has had no recurrent focal seizures either.  She denies any family history of DVT, pulmonary embolism or strokes or heart attacks in a young age    ROS:   75 system review of systems is positive for those listed in HPI and all other systems negative  PMH:  Past Medical History:  Diagnosis Date   Asthma    uses inhaler prn   Diabetes mellitus without complication ([place])    Sickle cell trait ([place])    Stroke ([place])    Unspecified high-risk pregnancy 01/27/2011    Social History:  Social History   Socioeconomic History   Marital status: Divorced    Spouse name: Not on file   Number of children: Not on file   Years of education: Not on file   Highest education level: Not on file  Occupational History   Not on file  Tobacco Use   Smoking status: Former   Smokeless tobacco: Never  Substance and Sexual Activity   Alcohol use: No   Drug use: No   Sexual activity: Yes    Birth control/protection: Implant  Other Topics Concern   Not on file  Social History Narrative   Lives with mother and 2 kids   Left Handed   Drinks 1-2 cups caffeine daily   Social Determinants of Health   Financial Resource Strain: Not on  file  Food Insecurity: Not on file  Transportation Needs: Not on file  Physical Activity: Not on file  Stress: Not on file  Social Connections: Not on file  Intimate Partner Violence: Not on file    Medications:   No current outpatient medications on file prior to visit.   No current facility-administered medications on file prior to visit.    Allergies:  No Known Allergies  Physical Exam Today's Vitals   02/07/21 0850  BP: 120/82  Pulse: 93  Weight: 157 lb (71.2 kg)  Height: 5' (1.524 m)   Body mass index is 30.66 kg/m.'  General: Obese middle-aged very pleasant African-American lady, seated, in no evident distress Head: head normocephalic and atraumatic.  Neck: supple with no carotid or supraclavicular bruits Cardiovascular: regular rate and rhythm, no murmurs Musculoskeletal: no deformity Skin:  no rash/petichiae Vascular:  Normal pulses all extremities  Neurologic Exam Mental Status: Awake and fully alert.  Fluent speech and language.  Oriented to place and time. Recent and remote memory intact. Attention span, concentration and fund of knowledge appropriate. Mood and affect appropriate.  Initial recall 3/3 (prior  2/3).  Able to name 10 (prior 11) animals which can walk on 4 legs.  Clock drawing 4/4. Serial addition good.  Cranial Nerves: Pupils equal, briskly reactive to light. Extraocular movements full without nystagmus. Visual fields full to confrontation. Hearing intact. Facial sensation intact. Face, tongue, palate moves normally and symmetrically.  Motor: Normal bulk and tone. Normal strength in all tested extremity muscles. Sensory.: intact to touch ,pinprick .position and vibratory sensation.  Coordination: Rapid alternating movements normal in all extremities. Finger-to-nose and heel-to-shin performed accurately bilaterally. Gait and Station: Arises from chair without difficulty. Stance is normal. Gait demonstrates normal stride length and balance . Able to  heel, toe and tandem walk without difficulty.  Reflexes: 1+ and symmetric. Toes downgoing.     IMAGING:  MR BRAIN W/WO CONTRAST 11/06/2020 IMPRESSION: MRI scan of the brain with and without contrast showing remote age hemorrhagic infarct in the left frontoparietal region with encephalomalacia and gliosis as well as small remote age infarcts in bilateral peripheral ventricular white matter, left basal ganglia and left periatrial region.  No acute infarct is noted.  No abnormal enhancement is noted after contrast.  Compared to previous MRI from January 2022 expected evolutionary changes are noted in the multiple cerebral infarcts as well as there is improved flow in the superior sagittal, transverse and sigmoid sinuses.   MR MRV HEAD WO CONTRAST 11/06/2020 IMPRESSION: MR venogram of the brain showing partial nonobstructive filling defects in posterior portion of the superior sagittal sinus and bilateral transverse sinuses and right sigmoid sinus and jugular vein due to partial recannalization of previous cerebral venous thrombosis but overall much improved compared with previous MR venogram from 02/18/2020   EEG 11/08/2020 Summary  Normal electroencephalogram, awake, asleep and with activation procedures. There are no focal lateralizing or epileptiform features.       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  No obvious risk factors except possible dehydration from preceding GI illness.      1.  CVST 2.  Ischemic strokes -Restart aspirin 81 mg daily for secondary stroke prevention measures - discussed importance of aspirin daily  -Establish care with PCP for routine follow-up for aggressive stroke risk factor management/monitoring -Repeat lipid panel today -  if LDL remains above 70, will recommend starting statin therapy -repeat MRV 11/06/2020 overall improvement of CVST  -repeat MR brain w/wo 11/06/2020 no new findings - old strokes noted -Hypercoagulable panel 09/2020 WNL  3.  Partial seizures -has not been on AED since approx 04/2020 - did not start keppra as previously advised -no additional seizure activity - recent repeat EEG (10/2020) no evidence of seizures  -okay to hold off on restarting AED as no seizures over the past year (since initial seizure 01/2020) -Discussed avoidance of seizure provoking triggers -Call with any potential seizure activity  4.  At risk for sleep apnea -Referral placed to [place] sleep clinic   5.  Short-term memory complaints 6. Anxiety, generalized -likely from prior strokes and underlying anxiety. If sleep apnea found, this could also be contributing. Referral placed to internal medicine to establish care with PCP for further treatment of anxiety. Discussed stress reduction activities.  -check B12 and TSH   Follow up as needed from stroke standpoint as stable as long as she ensures she establishes care with PCP    Frann Rider, AGNP-BC  [facility] [address] [place], [place] [address]  Phone [phone] Fax [phone] Note: This document was prepared with digital dictation and possible smart phrase technology. Any transcriptional errors that result from this process are unintentional.  I spent 31 minutes of face-to-face and non-face-to-face time with patient.  This included previsit chart review, lab review, study review, order entry, electronic health record documentation, patient education and discussion regarding history of prior strokes and further secondary stroke prevention measures and importance of aggressive stroke risk factor management, CVST with prior repeat imaging, prior partial seizure, possible sleep apnea and other general concerns as above and  answered all other questions to patient satisfaction

## 2021-02-08 ENCOUNTER — Other Ambulatory Visit: Payer: Self-pay | Admitting: Adult Health

## 2021-02-08 DIAGNOSIS — Z8673 Personal history of transient ischemic attack (TIA), and cerebral infarction without residual deficits: Secondary | ICD-10-CM

## 2021-02-08 DIAGNOSIS — D75839 Thrombocytosis, unspecified: Secondary | ICD-10-CM

## 2021-02-08 DIAGNOSIS — Z86718 Personal history of other venous thrombosis and embolism: Secondary | ICD-10-CM

## 2021-02-08 LAB — COMPREHENSIVE METABOLIC PANEL
ALT: 11 IU/L (ref 0–32)
AST: 12 IU/L (ref 0–40)
Albumin/Globulin Ratio: 1.5 (ref 1.2–2.2)
Albumin: 4.1 g/dL (ref 3.8–4.8)
Alkaline Phosphatase: 81 IU/L (ref 44–121)
BUN/Creatinine Ratio: 11 (ref 9–23)
BUN: 7 mg/dL (ref 6–20)
Bilirubin Total: 0.4 mg/dL (ref 0.0–1.2)
CO2: 25 mmol/L (ref 20–29)
Calcium: 9.6 mg/dL (ref 8.7–10.2)
Chloride: 106 mmol/L (ref 96–106)
Creatinine, Ser: 0.64 mg/dL (ref 0.57–1.00)
Globulin, Total: 2.8 g/dL (ref 1.5–4.5)
Glucose: 91 mg/dL (ref 70–99)
Potassium: 4.9 mmol/L (ref 3.5–5.2)
Sodium: 143 mmol/L (ref 134–144)
Total Protein: 6.9 g/dL (ref 6.0–8.5)
eGFR: 117 mL/min/{1.73_m2} (ref >59)

## 2021-02-08 LAB — CBC
Hematocrit: 37.6 % (ref 34.0–46.6)
Hemoglobin: 12.3 g/dL (ref 11.1–15.9)
MCH: 30.3 pg (ref 26.6–33.0)
MCHC: 32.7 g/dL (ref 31.5–35.7)
MCV: 93 fL (ref 79–97)
Platelets: 805 10*3/uL (ref 150–450)
RBC: 4.06 x10E6/uL (ref 3.77–5.28)
RDW: 12.6 % (ref 11.7–15.4)
WBC: 5.7 10*3/uL (ref 3.4–10.8)

## 2021-02-08 LAB — LIPID PANEL
Chol/HDL Ratio: 3 ratio (ref 0.0–4.4)
Cholesterol, Total: 149 mg/dL (ref 100–199)
HDL: 49 mg/dL (ref >39)
LDL Chol Calc (NIH): 91 mg/dL (ref 0–99)
Triglycerides: 37 mg/dL (ref 0–149)
VLDL Cholesterol Cal: 9 mg/dL (ref 5–40)

## 2021-02-08 LAB — VITAMIN B12: Vitamin B-12: 733 pg/mL (ref 232–1245)

## 2021-02-08 LAB — TSH: TSH: 1.28 u[IU]/mL (ref 0.450–4.500)

## 2021-02-09 ENCOUNTER — Telehealth: Payer: Self-pay

## 2021-02-09 ENCOUNTER — Telehealth: Payer: Self-pay | Admitting: Hematology and Oncology

## 2021-02-09 NOTE — Telephone Encounter (Signed)
Scheduled appt per 1/17 referral. Pt is aware of appt date and time. Pt is aware arrive 15 mins prior to appt.

## 2021-02-09 NOTE — Telephone Encounter (Signed)
-----   Message from Frann Rider, NP sent at 02/08/2021  4:39 PM EST ----- Please advise patient that recent lab work showed persistent gradual elevation of platelet count. Will refer to hematology/oncology for further evaluation

## 2021-02-09 NOTE — Telephone Encounter (Signed)
Contacted pt, informed her of results. She ask what is her platelets do, informed her they are assist with blood clotting. Also asked if referral was placed for sleep study, informed her it was on 02/07/21 and both hema/onco and sleep lab will give her a call for scheduling. She understood and was appreciative.

## 2021-02-25 ENCOUNTER — Other Ambulatory Visit: Payer: Self-pay

## 2021-02-25 ENCOUNTER — Encounter: Payer: Self-pay | Admitting: Hematology and Oncology

## 2021-02-25 ENCOUNTER — Inpatient Hospital Stay: Payer: Medicaid Other

## 2021-02-25 ENCOUNTER — Inpatient Hospital Stay: Payer: Medicaid Other | Attending: Hematology and Oncology | Admitting: Hematology and Oncology

## 2021-02-25 VITALS — BP 109/72 | HR 95 | Temp 97.7°F | Resp 18 | Ht 60.0 in | Wt 163.1 lb

## 2021-02-25 DIAGNOSIS — Z814 Family history of other substance abuse and dependence: Secondary | ICD-10-CM | POA: Insufficient documentation

## 2021-02-25 DIAGNOSIS — Z818 Family history of other mental and behavioral disorders: Secondary | ICD-10-CM | POA: Diagnosis not present

## 2021-02-25 DIAGNOSIS — Z79899 Other long term (current) drug therapy: Secondary | ICD-10-CM | POA: Insufficient documentation

## 2021-02-25 DIAGNOSIS — Z832 Family history of diseases of the blood and blood-forming organs and certain disorders involving the immune mechanism: Secondary | ICD-10-CM | POA: Diagnosis not present

## 2021-02-25 DIAGNOSIS — Z87891 Personal history of nicotine dependence: Secondary | ICD-10-CM | POA: Insufficient documentation

## 2021-02-25 DIAGNOSIS — D573 Sickle-cell trait: Secondary | ICD-10-CM | POA: Diagnosis not present

## 2021-02-25 DIAGNOSIS — E611 Iron deficiency: Secondary | ICD-10-CM | POA: Insufficient documentation

## 2021-02-25 DIAGNOSIS — D75839 Thrombocytosis, unspecified: Secondary | ICD-10-CM | POA: Diagnosis not present

## 2021-02-25 DIAGNOSIS — Z8673 Personal history of transient ischemic attack (TIA), and cerebral infarction without residual deficits: Secondary | ICD-10-CM | POA: Diagnosis not present

## 2021-02-25 DIAGNOSIS — Z841 Family history of disorders of kidney and ureter: Secondary | ICD-10-CM | POA: Diagnosis not present

## 2021-02-25 DIAGNOSIS — J45909 Unspecified asthma, uncomplicated: Secondary | ICD-10-CM | POA: Diagnosis not present

## 2021-02-25 DIAGNOSIS — R519 Headache, unspecified: Secondary | ICD-10-CM | POA: Diagnosis not present

## 2021-02-25 DIAGNOSIS — Z833 Family history of diabetes mellitus: Secondary | ICD-10-CM | POA: Diagnosis not present

## 2021-02-25 DIAGNOSIS — Z836 Family history of other diseases of the respiratory system: Secondary | ICD-10-CM | POA: Diagnosis not present

## 2021-02-25 DIAGNOSIS — D473 Essential (hemorrhagic) thrombocythemia: Secondary | ICD-10-CM | POA: Diagnosis not present

## 2021-02-25 DIAGNOSIS — N920 Excessive and frequent menstruation with regular cycle: Secondary | ICD-10-CM | POA: Diagnosis not present

## 2021-02-25 DIAGNOSIS — E119 Type 2 diabetes mellitus without complications: Secondary | ICD-10-CM | POA: Diagnosis not present

## 2021-02-25 LAB — CBC WITH DIFFERENTIAL/PLATELET
Abs Immature Granulocytes: 0.05 10*3/uL (ref 0.00–0.07)
Basophils Absolute: 0 10*3/uL (ref 0.0–0.1)
Basophils Relative: 1 %
Eosinophils Absolute: 0.3 10*3/uL (ref 0.0–0.5)
Eosinophils Relative: 4 %
HCT: 35 % — ABNORMAL LOW (ref 36.0–46.0)
Hemoglobin: 11.3 g/dL — ABNORMAL LOW (ref 12.0–15.0)
Immature Granulocytes: 1 %
Lymphocytes Relative: 26 %
Lymphs Abs: 1.8 10*3/uL (ref 0.7–4.0)
MCH: 29.9 pg (ref 26.0–34.0)
MCHC: 32.3 g/dL (ref 30.0–36.0)
MCV: 92.6 fL (ref 80.0–100.0)
Monocytes Absolute: 0.8 10*3/uL (ref 0.1–1.0)
Monocytes Relative: 11 %
Neutro Abs: 4.2 10*3/uL (ref 1.7–7.7)
Neutrophils Relative %: 57 %
Platelets: 622 10*3/uL — ABNORMAL HIGH (ref 150–400)
RBC: 3.78 MIL/uL — ABNORMAL LOW (ref 3.87–5.11)
RDW: 13.2 % (ref 11.5–15.5)
WBC: 7.2 10*3/uL (ref 4.0–10.5)
nRBC: 0 % (ref 0.0–0.2)

## 2021-02-25 LAB — COMPREHENSIVE METABOLIC PANEL
ALT: 12 U/L (ref 0–44)
AST: 13 U/L — ABNORMAL LOW (ref 15–41)
Albumin: 4.1 g/dL (ref 3.5–5.0)
Alkaline Phosphatase: 62 U/L (ref 38–126)
Anion gap: 5 (ref 5–15)
BUN: 9 mg/dL (ref 6–20)
CO2: 25 mmol/L (ref 22–32)
Calcium: 9.3 mg/dL (ref 8.9–10.3)
Chloride: 104 mmol/L (ref 98–111)
Creatinine, Ser: 0.52 mg/dL (ref 0.44–1.00)
GFR, Estimated: >60 mL/min (ref >60)
Glucose, Bld: 113 mg/dL — ABNORMAL HIGH (ref 70–99)
Potassium: 4.1 mmol/L (ref 3.5–5.1)
Sodium: 134 mmol/L — ABNORMAL LOW (ref 135–145)
Total Bilirubin: 0.6 mg/dL (ref 0.3–1.2)
Total Protein: 7.7 g/dL (ref 6.5–8.1)

## 2021-02-25 LAB — IRON AND IRON BINDING CAPACITY (CC-WL,HP ONLY)
Iron: 116 ug/dL (ref 28–170)
Saturation Ratios: 26 % (ref 10.4–31.8)
TIBC: 448 ug/dL (ref 250–450)
UIBC: 332 ug/dL (ref 148–442)

## 2021-02-25 LAB — FERRITIN: Ferritin: 17 ng/mL (ref 11–307)

## 2021-02-25 NOTE — Progress Notes (Signed)
[place] CONSULT NOTE  Patient Care Team: Pcp, No as PCP - General  CHIEF COMPLAINTS/PURPOSE OF CONSULTATION:  Thrombocytosis.  ASSESSMENT & PLAN:   This is a 36 year old female patient with thrombocytosis referred to hematology for additional recommendations.  Patient is not the greatest historian but he tried to discuss the following details about thrombocytosis.  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 She took anticoagulation for 3 months,  She also does not eat red meat and has not been eating red meat for the past 2 years, she says tried me does not agree with her.  She reports some heavy menstrual cycles.  Orders Placed This Encounter  Procedures   JAK2 (INCLUDING V617F AND EXON 12), MPL,& CALR W/RFL MPN PANEL (NGS)   CBC with Differential/Platelet    Standing Status:   Standing    Number of Occurrences:   22    Standing Expiration Date:   02/25/2022   Comprehensive metabolic panel    Standing Status:   Standing    Number of Occurrences:   33    Standing Expiration Date:   02/25/2022   Ferritin    Standing Status:   Future    Number of Occurrences:   1    Standing Expiration Date:   02/25/2022   Pathologist smear review    Standing Status:   Future    Number of Occurrences:   1    Standing Expiration Date:   02/25/2022     Differential diagnosis  1. Primary thrombocytosis: Related to myeloproliferative disorders of the bone marrow especially essential thrombocytosis and CML. I would like to send for BCR-ABL as well as JAK-2 mutation analysis. Patient understands that JAK2 mutation is only present in 50% of essential thrombocytosis so the test is advantageous only if it is positive. If it is negative, it does not rule out. 2. Secondary/reactive thrombocytosis Different causes  including infections, inflammation, iron deficiency.  I would like to send out iron studies with ferritin to complete the workup.  Treatment options: 1. If it is primary essential thrombocytosis, treatment would depend on platelet count level as well as history of thrombosis. A. For low risk patients, the treatment would be with aspirin therapy B. For high risk patients, the treatment would be platelet lowering therapy with aspirin 2. Treatment of secondary thrombocytosis would be to treat underlying cause. There would not be any risk of thrombosis with secondary thrombocytosis.  Although this could very well be from iron deficiency or secondary thrombocytosis, given her history of cerebral venous sinus thrombosis, I have asked JAK2 panel to look into the possibility of essential thrombocytosis. Return to clinic in 3 to 4 weeks to discuss the results of these tests.  With regards to cerebral venous sinus thrombosis, have discussed that if she wishes to pursue pregnancy, she will be at higher risk of having another blood clot during pregnancy or immediate postpartum.  I do not quite know if she understands the consequences of recurrence/stroke.  I tried to at least encourage her to take a baby aspirin a day.  Thank you for consulting [place] in the care of this patient.  Please do not hesitate to contact us with any additional questions or concerns.  HISTORY OF PRESENTING ILLNESS:  Jill Clarke 36 y.o. female is here  because of thrombocytosis.  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  She arrived to the appointment today by herself.  She does not appear to have a good understanding of her underlying health conditions.  She tells me that she was starving herself for fasting herself for 7 days prior to  that admission with cerebral venous sinus thrombosis.  She denies any pregnancy or being postpartum at that time.  She was not on any birth control or hormones at the time of her clot as well.  She is not a great historian.  She took Pradaxa for maybe 3 months, she could not afford buying the medication.  Most recently she had a repeat MRI/MRV which showed improving cerebral venous sinus thrombus.  She also had some labs in January which showed increasing platelet count and hence referred to hematology for evaluation of thrombocytosis. She denies any fevers, drenching night sweats, loss of weight.  She does not have good appetite.  No change in breathing although she has always had some asthma related issues.  No change in bowel habits or urinary habits.  No new neurological complaints.   Rest of the pertinent 10 point ROS reviewed and negative.  MEDICAL HISTORY:  Past Medical History:  Diagnosis Date   Asthma    uses inhaler prn   Diabetes mellitus without complication ([facility])    Sickle cell trait ([place])    Stroke ([place])    Unspecified high-risk pregnancy 01/27/2011    SURGICAL HISTORY: Past Surgical History:  Procedure Laterality Date   IR ANGIO INTRA EXTRACRAN SEL COM CAROTID INNOMINATE UNI R MOD SED  02/13/2020   IR ANGIO INTRA EXTRACRAN SEL COM CAROTID INNOMINATE UNI R MOD SED  02/15/2020   IR ANGIO INTRA EXTRACRAN SEL INTERNAL CAROTID UNI L MOD SED  02/13/2020   IR ANGIO VERTEBRAL SEL VERTEBRAL UNI R MOD SED  02/13/2020   IR CT HEAD LTD  02/13/2020   IR CT HEAD LTD  02/15/2020   IR PTA VENOUS EXCEPT DIALYSIS CIRCUIT  02/15/2020   IR THROMBECT VENO MECH MOD SED  02/13/2020   IR THROMBECT VENO MECH REPT MOD SED  02/15/2020   RADIOLOGY WITH ANESTHESIA N/A 02/13/2020   Procedure: IR WITH ANESTHESIA;  Surgeon: Luanne Bras, MD;  Location: [facility];  Service: Radiology;  Laterality: N/A;   RADIOLOGY WITH ANESTHESIA N/A 02/15/2020   Procedure: IR WITH ANESTHESIA;  Surgeon: Radiologist, Medication,  MD;  Location: [facility];  Service: Radiology;  Laterality: N/A;   TONSILLECTOMY     WISDOM TOOTH EXTRACTION      SOCIAL HISTORY: Social History   Socioeconomic History   Marital status: Divorced    Spouse name: Not on file   Number of children: Not on file   Years of education: Not on file   Highest education level: Not on file  Occupational History   Not on file  Tobacco Use   Smoking status: Former   Smokeless tobacco: Never  Substance and Sexual Activity   Alcohol use: No   Drug use: No   Sexual activity: Yes    Birth control/protection: Implant  Other Topics Concern   Not on file  Social History Narrative   Lives with mother and 2 kids   Left Handed   Drinks 1-2 cups caffeine daily   Social Determinants of Health   Financial  Resource Strain: Not on file  Food Insecurity: Not on file  Transportation Needs: Not on file  Physical Activity: Not on file  Stress: Not on file  Social Connections: Not on file  Intimate Partner Violence: Not on file    FAMILY HISTORY: Family History  Problem Relation Age of Onset   Sickle cell trait Mother    Depression Mother    Asthma Father    Drug abuse Father        heroin abuse   Kidney disease Father        due to heroin abuse   Diabetes Maternal Grandmother     ALLERGIES:  has No Known Allergies.  MEDICATIONS:  Current Outpatient Medications  Medication Sig Dispense Refill   aspirin EC 81 MG tablet Take 1 tablet (81 mg total) by mouth daily. Swallow whole. 30 tablet 11   No current facility-administered medications for this visit.     PHYSICAL EXAMINATION: ECOG PERFORMANCE STATUS: 0 - Asymptomatic  Vitals:   02/25/21 1033  BP: 109/72  Pulse: 95  Resp: 18  Temp: 97.7 F (36.5 C)  SpO2: 100%   Filed Weights   02/25/21 1033  Weight: 163 lb 1 oz (74 kg)   GENERAL:alert, no distress and comfortable SKIN: skin color, texture, turgor are normal, no rashes or significant lesions EYES: normal, conjunctiva are  pink and non-injected, sclera clear OROPHARYNX:no exudate, no erythema and lips, buccal mucosa, and tongue normal  NECK: supple, thyroid normal size, non-tender, without nodularity LYMPH:  no palpable lymphadenopathy in the cervical, axillary LUNGS: clear to auscultation and percussion with normal breathing effort HEART: regular rate & rhythm and no murmurs and no lower extremity edema ABDOMEN:abdomen soft, non-tender and normal bowel sounds Musculoskeletal:no cyanosis of digits and no clubbing  PSYCH: alert & oriented x 3 with fluent speech NEURO: no focal motor/sensory deficits  LABORATORY DATA:  I have reviewed the data as listed Lab Results  Component Value Date   WBC 7.2 02/25/2021   HGB 11.3 (L) 02/25/2021   HCT 35.0 (L) 02/25/2021   MCV 92.6 02/25/2021   PLT 622 (H) 02/25/2021     Chemistry      Component Value Date/Time   NA 134 (L) 02/25/2021 1127   NA 143 02/07/2021 0921   K 4.1 02/25/2021 1127   CL 104 02/25/2021 1127   CO2 25 02/25/2021 1127   BUN 9 02/25/2021 1127   BUN 7 02/07/2021 0921   CREATININE 0.52 02/25/2021 1127   CREATININE 0.44 (L) 01/30/2011 1147      Component Value Date/Time   CALCIUM 9.3 02/25/2021 1127   ALKPHOS 62 02/25/2021 1127   AST 13 (L) 02/25/2021 1127   ALT 12 02/25/2021 1127   BILITOT 0.6 02/25/2021 1127   BILITOT 0.4 02/07/2021 6314       RADIOGRAPHIC STUDIES: I have personally reviewed the radiological images as listed and agreed with the findings in the report.  No results found.  All questions were answered. The patient knows to call the clinic with any problems, questions or concerns. I spent 45 minutes in the care of this patient including H and P, review of records, counseling and coordination of care.     Benay Pike, MD 02/25/2021 12:34 PM

## 2021-02-28 ENCOUNTER — Telehealth: Payer: Self-pay | Admitting: Hematology and Oncology

## 2021-02-28 LAB — PATHOLOGIST SMEAR REVIEW

## 2021-02-28 NOTE — Telephone Encounter (Signed)
Scheduled appointment per 02/03 los.Left message.

## 2021-03-02 LAB — JAK2 (INCLUDING V617F AND EXON 12), MPL,& CALR W/RFL MPN PANEL (NGS)

## 2021-03-09 ENCOUNTER — Telehealth: Payer: Self-pay | Admitting: *Deleted

## 2021-03-09 MED ORDER — FERROUS SULFATE 325 (65 FE) MG PO TBEC: 325.0000 mg | DELAYED_RELEASE_TABLET | Freq: Two times a day (BID) | ORAL | 3 refills | Status: AC

## 2021-03-09 MED ORDER — DOCUSATE SODIUM 100 MG PO CAPS: 100.0000 mg | ORAL_CAPSULE | Freq: Two times a day (BID) | ORAL | 0 refills | Status: DC

## 2021-03-09 NOTE — Telephone Encounter (Signed)
This RN spoke with pt per MD review of labs and need to start iron supplement.  Discussed use and prescriptions sent to verified pharmacy.

## 2021-03-17 ENCOUNTER — Encounter: Payer: Self-pay | Admitting: Hematology and Oncology

## 2021-03-17 ENCOUNTER — Inpatient Hospital Stay (HOSPITAL_BASED_OUTPATIENT_CLINIC_OR_DEPARTMENT_OTHER): Payer: Medicaid Other | Admitting: Hematology and Oncology

## 2021-03-17 ENCOUNTER — Other Ambulatory Visit: Payer: Self-pay

## 2021-03-17 VITALS — BP 116/77 | HR 107 | Temp 97.9°F | Wt 157.0 lb

## 2021-03-17 DIAGNOSIS — E119 Type 2 diabetes mellitus without complications: Secondary | ICD-10-CM | POA: Diagnosis not present

## 2021-03-17 DIAGNOSIS — Z79899 Other long term (current) drug therapy: Secondary | ICD-10-CM | POA: Diagnosis not present

## 2021-03-17 DIAGNOSIS — R519 Headache, unspecified: Secondary | ICD-10-CM | POA: Diagnosis not present

## 2021-03-17 DIAGNOSIS — E611 Iron deficiency: Secondary | ICD-10-CM | POA: Diagnosis not present

## 2021-03-17 DIAGNOSIS — Z833 Family history of diabetes mellitus: Secondary | ICD-10-CM | POA: Diagnosis not present

## 2021-03-17 DIAGNOSIS — Z8673 Personal history of transient ischemic attack (TIA), and cerebral infarction without residual deficits: Secondary | ICD-10-CM | POA: Diagnosis not present

## 2021-03-17 DIAGNOSIS — Z832 Family history of diseases of the blood and blood-forming organs and certain disorders involving the immune mechanism: Secondary | ICD-10-CM | POA: Diagnosis not present

## 2021-03-17 DIAGNOSIS — D75839 Thrombocytosis, unspecified: Secondary | ICD-10-CM | POA: Diagnosis not present

## 2021-03-17 DIAGNOSIS — Z87891 Personal history of nicotine dependence: Secondary | ICD-10-CM | POA: Diagnosis not present

## 2021-03-17 DIAGNOSIS — Z836 Family history of other diseases of the respiratory system: Secondary | ICD-10-CM | POA: Diagnosis not present

## 2021-03-17 DIAGNOSIS — Z818 Family history of other mental and behavioral disorders: Secondary | ICD-10-CM | POA: Diagnosis not present

## 2021-03-17 DIAGNOSIS — N920 Excessive and frequent menstruation with regular cycle: Secondary | ICD-10-CM | POA: Diagnosis not present

## 2021-03-17 DIAGNOSIS — D573 Sickle-cell trait: Secondary | ICD-10-CM | POA: Diagnosis not present

## 2021-03-17 DIAGNOSIS — Z814 Family history of other substance abuse and dependence: Secondary | ICD-10-CM | POA: Diagnosis not present

## 2021-03-17 DIAGNOSIS — Z841 Family history of disorders of kidney and ureter: Secondary | ICD-10-CM | POA: Diagnosis not present

## 2021-03-17 DIAGNOSIS — D473 Essential (hemorrhagic) thrombocythemia: Secondary | ICD-10-CM

## 2021-03-17 DIAGNOSIS — J45909 Unspecified asthma, uncomplicated: Secondary | ICD-10-CM | POA: Diagnosis not present

## 2021-03-17 DIAGNOSIS — Z1589 Genetic susceptibility to other disease: Secondary | ICD-10-CM | POA: Diagnosis not present

## 2021-03-17 MED ORDER — APIXABAN 5 MG PO TABS: 5.0000 mg | ORAL_TABLET | Freq: Two times a day (BID) | ORAL | 2 refills | Status: DC

## 2021-03-17 NOTE — Progress Notes (Signed)
[facility] CONSULT NOTE  Patient Care Team: Pcp, No as PCP - General  CHIEF COMPLAINTS/PURPOSE OF CONSULTATION:  Thrombocytosis.  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 She is here for a follow up to discuss results and treatment recommendations. Tried to explain to her that she has JAK2 mutation which is Janus kinase mutation which increases sensitivity of the bone marrow stimulus and hence results and increased blood counts such as leukocytosis or thrombocytosis.  I also explained to her that JAK2 makes the blood sticky and can increase risk of blood clots such as heart attacks, strokes and other blood clots.  I have explained to her that typically with JAK2 positive essential thrombocytosis especially given her past medical history of cerebral venous sinus thrombosis, she should consider taking blood thinners.  I have also briefly mentioned that Hydrea can be used to decrease the platelet production.  Initially when I tried to explain this to her, she kept saying that she is absolutely not taking anything that we give her.  She does not believe in medications, she will try natural blood thinners.  She started making statements that because of her color, she has been under oppression and we think we can give her any medication. She was quite upset about Hydrea being one of the chemotherapy medications.  She is started making statements that none of the treatment makes any sense.  I tried to explain to her that this is standard of care treatment and without blood thinners, she can have a fatal heart attack or a stroke or a blood clot and can die from this.  At the end of our conversation, she said she will try the blood thinner.  We have discussed that risk of blood thinners include bleeding which rarely can be life-threatening.  She said to prescribe blood thinners to her pharmacy and she will think  about it.  In the meantime of also encouraged her to read about the Hydrea and to return to clinic in 4 weeks.  I have asked her to consider taking a baby aspirin at least if she decides not to take anticoagulation.   She was also given my card to call for any other questions or concerns.  Thank you for consulting [place] in the care of this patient.  Please do not hesitate to contact us with any additional questions or concerns.  HISTORY OF PRESENTING ILLNESS:  Jill Clarke 36 y.o. female is here because of thrombocytosis.  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  Since the last visit, she had no complaints except for some headaches with iron deficiency. No other complaints.  MEDICAL HISTORY:  Past Medical History:  Diagnosis Date   Asthma    uses inhaler prn   Diabetes mellitus without complication ([place])    Sickle cell trait ([place])    Stroke ([place])    Unspecified high-risk pregnancy 01/27/2011    SURGICAL HISTORY: Past Surgical History:  Procedure Laterality Date   IR ANGIO INTRA EXTRACRAN SEL COM CAROTID INNOMINATE UNI R MOD SED  02/13/2020   IR ANGIO INTRA EXTRACRAN SEL COM CAROTID INNOMINATE UNI R MOD SED  02/15/2020   IR ANGIO INTRA EXTRACRAN SEL INTERNAL CAROTID UNI L MOD SED  02/13/2020   IR ANGIO VERTEBRAL  SEL VERTEBRAL UNI R MOD SED  02/13/2020   IR CT HEAD LTD  02/13/2020   IR CT HEAD LTD  02/15/2020   IR PTA VENOUS EXCEPT DIALYSIS CIRCUIT  02/15/2020   IR THROMBECT VENO MECH MOD SED  02/13/2020   IR THROMBECT VENO MECH REPT MOD SED  02/15/2020   RADIOLOGY WITH ANESTHESIA N/A 02/13/2020   Procedure: IR WITH ANESTHESIA;  Surgeon: Luanne Bras, MD;  Location: [facility];  Service: Radiology;  Laterality: N/A;   RADIOLOGY WITH ANESTHESIA N/A 02/15/2020   Procedure: IR WITH ANESTHESIA;   Surgeon: Radiologist, Medication, MD;  Location: [facility];  Service: Radiology;  Laterality: N/A;   TONSILLECTOMY     WISDOM TOOTH EXTRACTION      SOCIAL HISTORY: Social History   Socioeconomic History   Marital status: Divorced    Spouse name: Not on file   Number of children: Not on file   Years of education: Not on file   Highest education level: Not on file  Occupational History   Not on file  Tobacco Use   Smoking status: Former   Smokeless tobacco: Never  Substance and Sexual Activity   Alcohol use: No   Drug use: No   Sexual activity: Yes    Birth control/protection: Implant  Other Topics Concern   Not on file  Social History Narrative   Lives with mother and 2 kids   Left Handed   Drinks 1-2 cups caffeine daily   Social Determinants of Health   Financial Resource Strain: Not on file  Food Insecurity: Not on file  Transportation Needs: Not on file  Physical Activity: Not on file  Stress: Not on file  Social Connections: Not on file  Intimate Partner Violence: Not on file    FAMILY HISTORY: Family History  Problem Relation Age of Onset   Sickle cell trait Mother    Depression Mother    Asthma Father    Drug abuse Father        heroin abuse   Kidney disease Father        due to heroin abuse   Diabetes Maternal Grandmother     ALLERGIES:  has No Known Allergies.  MEDICATIONS:  Current Outpatient Medications  Medication Sig Dispense Refill   aspirin EC 81 MG tablet Take 1 tablet (81 mg total) by mouth daily. Swallow whole. 30 tablet 11   docusate sodium (COLACE) 100 MG capsule Take 1 capsule (100 mg total) by mouth 2 (two) times daily. 10 capsule 0   ferrous sulfate 325 (65 FE) MG EC tablet Take 1 tablet (325 mg total) by mouth in the morning and at bedtime. 60 tablet 3   No current facility-administered medications for this visit.     PHYSICAL EXAMINATION: ECOG PERFORMANCE STATUS: 0 - Asymptomatic  Vitals:   03/17/21 1607  BP: 116/77  Pulse:  (!) 107  Temp: 97.9 F (36.6 C)  SpO2: 100%   Filed Weights   03/17/21 1607  Weight: 157 lb (71.2 kg)   PE deferred in lieu of counseling.  LABORATORY DATA:  I have reviewed the data as listed Lab Results  Component Value Date   WBC 7.2 02/25/2021   HGB 11.3 (L) 02/25/2021   HCT 35.0 (L) 02/25/2021   MCV 92.6 02/25/2021   PLT 622 (H) 02/25/2021     Chemistry      Component Value Date/Time   NA 134 (L) 02/25/2021 1127   NA 143 02/07/2021 0921   K 4.1 02/25/2021  1127   CL 104 02/25/2021 1127   CO2 25 02/25/2021 1127   BUN 9 02/25/2021 1127   BUN 7 02/07/2021 0921   CREATININE 0.52 02/25/2021 1127   CREATININE 0.44 (L) 01/30/2011 1147      Component Value Date/Time   CALCIUM 9.3 02/25/2021 1127   ALKPHOS 62 02/25/2021 1127   AST 13 (L) 02/25/2021 1127   ALT 12 02/25/2021 1127   BILITOT 0.6 02/25/2021 1127   BILITOT 0.4 02/07/2021 0921     Labs show JAK2 [id number] mutation. CBC showed elevated platelet count at least for the past 1 year.  Normal WBC count on the most recent labs.  Normocytic normochromic anemia  RADIOGRAPHIC STUDIES: I have personally reviewed the radiological images as listed and agreed with the findings in the report.  No results found.  All questions were answered. The patient knows to call the clinic with any problems, questions or concerns. I spent 40 minutes in the care of this patient including H and P, review of records, counseling and coordination of care.  I tried to explain to her the results, role of JAK2, essential thrombocytosis treatment, complications of VTE, Hydrea ,risk factors without treatment for ET.     Benay Pike, MD 03/17/2021 5:10 PM

## 2021-03-18 ENCOUNTER — Telehealth: Payer: Self-pay | Admitting: Hematology and Oncology

## 2021-03-18 NOTE — Telephone Encounter (Signed)
Scheduled appointment epr 02/23 los. Patient aware.

## 2021-04-18 ENCOUNTER — Ambulatory Visit: Payer: Medicaid Other | Admitting: Adult Health

## 2021-04-19 ENCOUNTER — Inpatient Hospital Stay: Payer: Medicaid Other | Admitting: Adult Health

## 2021-04-19 ENCOUNTER — Telehealth: Payer: Self-pay

## 2021-04-19 ENCOUNTER — Telehealth: Payer: Self-pay | Admitting: Adult Health

## 2021-04-19 NOTE — Telephone Encounter (Signed)
Patient called in response to message regarding missed appointment today. Patient did not know why she had the appointment. I explained need for follow-up regarding medications she is taking to thin her blood. Patient voiced understanding and agreed for appointment to be rescheduled. ?

## 2021-04-19 NOTE — Telephone Encounter (Signed)
.  Called patient to schedule appointment per 3/28 inbasket, patient is aware of date and time.   ?

## 2021-04-20 ENCOUNTER — Ambulatory Visit (INDEPENDENT_AMBULATORY_CARE_PROVIDER_SITE_OTHER): Payer: Medicaid Other | Admitting: Neurology

## 2021-04-20 ENCOUNTER — Encounter: Payer: Self-pay | Admitting: Neurology

## 2021-04-20 VITALS — BP 107/74 | HR 98 | Ht 60.0 in | Wt 157.0 lb

## 2021-04-20 DIAGNOSIS — G4719 Other hypersomnia: Secondary | ICD-10-CM

## 2021-04-20 DIAGNOSIS — R0683 Snoring: Secondary | ICD-10-CM | POA: Diagnosis not present

## 2021-04-20 DIAGNOSIS — Z8673 Personal history of transient ischemic attack (TIA), and cerebral infarction without residual deficits: Secondary | ICD-10-CM | POA: Diagnosis not present

## 2021-04-20 DIAGNOSIS — R569 Unspecified convulsions: Secondary | ICD-10-CM | POA: Diagnosis not present

## 2021-04-20 DIAGNOSIS — D75839 Thrombocytosis, unspecified: Secondary | ICD-10-CM

## 2021-04-20 DIAGNOSIS — R0681 Apnea, not elsewhere classified: Secondary | ICD-10-CM | POA: Diagnosis not present

## 2021-04-20 DIAGNOSIS — Z86718 Personal history of other venous thrombosis and embolism: Secondary | ICD-10-CM | POA: Diagnosis not present

## 2021-04-20 NOTE — Progress Notes (Signed)
Subjective:  ?  ?Patient ID: Jill Clarke is a 36 y.o. female. ? ?HPI ? ? ? ?History:  ? ?Dear Jill Clarke and Jill Clarke,  ? ?I saw your patient, Jill Clarke, upon your kind request in my sleep clinic today for initial consultation of her sleep disorder, combined for underlying obstructive sleep apnea.  The patient is unaccompanied today.  As you know, Jill Clarke is a 36 year old right-handed woman with an underlying medical history of cerebral venous sinus thrombosis, seizure disorder, thrombocytosis, sickle cell trait, asthma, diabetes, and borderline obesity, who reports snoring and excessive daytime somnolence.  I reviewed your office note from 02/07/2021.  Her Epworth sleepiness score is 14 out of 24, fatigue severity score is 61 of 63.  She works as a Recruitment consultant.  Her schedule varies.  She has difficulty falling asleep.  She goes to bed around 2 or 3 AM and gets up around 6 AM.  She reports that her spacing at night, she has not tried any over-the-counter or prescription sleep aids.  She has a longer commute as well.  She does not drink any caffeine and quit smoking over 10 years ago.  She does not drink any alcohol.  She lives with her mom.  Mom has reported apneic pauses while she is asleep.  She is a mouth breather and has snored since childhood.  She has 2 children.  She does not watch TV in her bedroom, no pets in the household.  She suffers from allergies and currently has significant nasal congestion and drainage.  She has not seen an allergy specialist. ? ?Her Past Medical History Is Significant For: ?Past Medical History:  ?Diagnosis Date  ? Asthma   ? uses inhaler prn  ? Diabetes mellitus without complication ([place])   ? Sickle cell trait ([place])   ? Stroke [facility])   ? Unspecified high-risk pregnancy 01/27/2011  ? ? ?Her Past Surgical History Is Significant For: ?Past Surgical History:  ?Procedure Laterality Date  ? IR ANGIO INTRA EXTRACRAN SEL COM CAROTID INNOMINATE UNI R MOD SED  02/13/2020  ? IR ANGIO  INTRA EXTRACRAN SEL COM CAROTID INNOMINATE UNI R MOD SED  02/15/2020  ? IR ANGIO INTRA EXTRACRAN SEL INTERNAL CAROTID UNI L MOD SED  02/13/2020  ? IR ANGIO VERTEBRAL SEL VERTEBRAL UNI R MOD SED  02/13/2020  ? IR CT HEAD LTD  02/13/2020  ? IR CT HEAD LTD  02/15/2020  ? IR PTA VENOUS EXCEPT DIALYSIS CIRCUIT  02/15/2020  ? IR THROMBECT VENO MECH MOD SED  02/13/2020  ? IR THROMBECT VENO MECH REPT MOD SED  02/15/2020  ? RADIOLOGY WITH ANESTHESIA N/A 02/13/2020  ? Procedure: IR WITH ANESTHESIA;  Surgeon: Luanne Bras, MD;  Location: [facility];  Service: Radiology;  Laterality: N/A;  ? RADIOLOGY WITH ANESTHESIA N/A 02/15/2020  ? Procedure: IR WITH ANESTHESIA;  Surgeon: Radiologist, Medication, MD;  Location: [facility];  Service: Radiology;  Laterality: N/A;  ? TONSILLECTOMY    ? WISDOM TOOTH EXTRACTION    ? ? ?Her Family History Is Significant For: ?Family History  ?Problem Relation Age of Onset  ? Sickle cell trait Mother   ? Depression Mother   ? Asthma Father   ? Drug abuse Father   ?     heroin abuse  ? Kidney disease Father   ?     due to heroin abuse  ? Diabetes Maternal Grandmother   ? Sleep apnea Neg Hx   ? Snoring Neg Hx   ? ? ?  Her Social History Is Significant For: ?Social History  ? ?Socioeconomic History  ? Marital status: Divorced  ?  Spouse name: Not on file  ? Number of children: 2  ? Years of education: Not on file  ? Highest education level: Not on file  ?Occupational History  ? Not on file  ?Tobacco Use  ? Smoking status: Former  ? Smokeless tobacco: Never  ?Substance and Sexual Activity  ? Alcohol use: No  ? Drug use: No  ? Sexual activity: Yes  ?  Birth control/protection: None, Condom, Abstinence  ?  Comment: h/o implant, depo  ?Other Topics Concern  ? Not on file  ?Social History Narrative  ? Lives with mother and her 2 kids  ? Left Handed  ? None   ? ?Social Determinants of Health  ? ?Financial Resource Strain: Not on file  ?Food Insecurity: Not on file  ?Transportation Needs: Not on file  ?Physical Activity:  Not on file  ?Stress: Not on file  ?Social Connections: Not on file  ? ? ?Her Allergies Are:  ?No Known Allergies:  ? ?Her Current Medications Are:  ?Outpatient Encounter Medications as of 04/20/2021  ?Medication Sig  ? apixaban (ELIQUIS) 5 MG TABS tablet Take 1 tablet (5 mg total) by mouth 2 (two) times daily.  ? aspirin EC 81 MG tablet Take 1 tablet (81 mg total) by mouth daily. Swallow whole.  ? docusate sodium (COLACE) 100 MG capsule Take 1 capsule (100 mg total) by mouth 2 (two) times daily.  ? ferrous sulfate 325 (65 FE) MG EC tablet Take 1 tablet (325 mg total) by mouth in the morning and at bedtime.  ? ?No facility-administered encounter medications on file as of 04/20/2021.  ?: ? ?Review of Systems:  ?Out of a complete 14 point review of systems, all are reviewed and negative with the exception of these symptoms as listed below: ? ?Review of Systems  ?Neurological:   ?     Rm 8 here alone for sleep consult. She states she has been snoring since she was a child. She has trouble going to sleep. She feels her body is on overdrive at night and her mind is racing. She wakes up tired. There are times when she goes to sleep she sleeps hard and its hard to wake her up. She denies having any sleep studies in the past. She states she could sleep all day. She always feel like energy is being drained off her. In January 2022 she had a stroke. ESS 14.   ? ?Objective:  ?Neurological Exam ? ?Physical Exam ?Physical Examination:  ? ?Vitals:  ? 04/20/21 1451 04/20/21 1457  ?BP: 103/73 107/74  ?Pulse: (!) 104 98  ? ? ?General Examination: The patient is a very pleasant 36 y.o. female in no acute distress. She appears well-developed and well-nourished and well groomed.  ? ?HEENT: Normocephalic, atraumatic, pupils are equal, round and reactive to light, extraocular tracking is good without limitation to gaze excursion or nystagmus noted. Hearing is grossly intact. Face is symmetric with normal facial animation. Speech sounds  nasally congested, otherwise no dysarthria, hypophonia or voice tremor. Neck is supple with FROM. There are no carotid bruits on auscultation. Oropharynx exam reveals: mild mouth dryness, adequate dental hygiene and marked airway crowding secondary to tonsillar size of about 3+ and prominent uvula, Mallampati class II, neck circumference of 14 inches.  She has a minimal overbite.  Tongue protrudes centrally and palate elevates symmetrically, mild pharyngeal erythema and clear  drainage noted.  Nasal inspection reveals mucosal swelling in the nose, no obvious septal deviation, she has a tendency to breathe through her mouth.   ? ?Chest: Clear to auscultation without wheezing, rhonchi or crackles noted. ? ?Heart: S1+S2+0, regular and normal without murmurs, rubs or gallops noted.  ? ?Abdomen: Soft, non-tender and non-distended. ? ?Extremities: There is no obvious edema.  ? ?Skin: Warm and dry without trophic changes noted.  ? ?Musculoskeletal: exam reveals no obvious joint deformities.  ? ?Neurologically:  ?Mental status: The patient is awake, alert and oriented in all 4 spheres. Her immediate and remote memory, attention, language skills and fund of knowledge are appropriate. There is no evidence of aphasia, agnosia, apraxia or anomia. Speech is clear with normal prosody and enunciation. Thought process is linear. Mood is normal and affect is normal.  ?Cranial nerves II - XII are as described above under HEENT exam.  ?Motor exam: Normal bulk, strength and tone is noted. There is no tremor, Romberg is negative. Reflexes are 2+ throughout. Fine motor skills and coordination: grossly intact.  ?Cerebellar testing: No dysmetria or intention tremor. There is no truncal or gait ataxia.  ?Sensory exam: intact to light touch in the upper and lower extremities.  ?Gait, station and balance: She stands easily. No veering to one side is noted. No leaning to one side is noted. Posture is age-appropriate and stance is narrow based.  Gait shows normal stride length and normal pace. No problems turning are noted.  ? ?Assessment and Plan:  ?In summary, JASSICA ZAZUETA is a very pleasant 36 y.o.-year old female with an underlying medi

## 2021-04-20 NOTE — Patient Instructions (Signed)

## 2021-04-27 ENCOUNTER — Inpatient Hospital Stay: Payer: Medicaid Other | Attending: Hematology and Oncology | Admitting: Adult Health

## 2021-04-27 DIAGNOSIS — Z91199 Patient's noncompliance with other medical treatment and regimen due to unspecified reason: Secondary | ICD-10-CM

## 2021-04-27 NOTE — Progress Notes (Signed)
Jill Clarke did not show up for her appointment today.  A letter has been mailed ?

## 2021-05-02 ENCOUNTER — Encounter (HOSPITAL_COMMUNITY): Payer: Self-pay

## 2021-05-02 ENCOUNTER — Emergency Department (HOSPITAL_COMMUNITY)
Admission: EM | Admit: 2021-05-02 | Discharge: 2021-05-03 | Disposition: A | Discharge status: Home / Self Care | Payer: Medicaid Other | Attending: Emergency Medicine | Admitting: Emergency Medicine

## 2021-05-02 ENCOUNTER — Other Ambulatory Visit: Payer: Self-pay

## 2021-05-02 DIAGNOSIS — R197 Diarrhea, unspecified: Secondary | ICD-10-CM | POA: Diagnosis not present

## 2021-05-02 DIAGNOSIS — R109 Unspecified abdominal pain: Secondary | ICD-10-CM | POA: Diagnosis not present

## 2021-05-02 DIAGNOSIS — Z7982 Long term (current) use of aspirin: Secondary | ICD-10-CM | POA: Diagnosis not present

## 2021-05-02 DIAGNOSIS — R112 Nausea with vomiting, unspecified: Secondary | ICD-10-CM

## 2021-05-02 DIAGNOSIS — Z7901 Long term (current) use of anticoagulants: Secondary | ICD-10-CM | POA: Insufficient documentation

## 2021-05-02 LAB — I-STAT BETA HCG BLOOD, ED (MC, WL, AP ONLY): I-stat hCG, quantitative: <5 m[IU]/mL (ref <5)

## 2021-05-02 LAB — COMPREHENSIVE METABOLIC PANEL
ALT: 19 U/L (ref 0–44)
AST: 19 U/L (ref 15–41)
Albumin: 3.9 g/dL (ref 3.5–5.0)
Alkaline Phosphatase: 93 U/L (ref 38–126)
Anion gap: 4 — ABNORMAL LOW (ref 5–15)
BUN: 16 mg/dL (ref 6–20)
CO2: 25 mmol/L (ref 22–32)
Calcium: 9.4 mg/dL (ref 8.9–10.3)
Chloride: 111 mmol/L (ref 98–111)
Creatinine, Ser: 0.7 mg/dL (ref 0.44–1.00)
GFR, Estimated: >60 mL/min (ref >60)
Glucose, Bld: 116 mg/dL — ABNORMAL HIGH (ref 70–99)
Potassium: 3.5 mmol/L (ref 3.5–5.1)
Sodium: 140 mmol/L (ref 135–145)
Total Bilirubin: 0.5 mg/dL (ref 0.3–1.2)
Total Protein: 7.8 g/dL (ref 6.5–8.1)

## 2021-05-02 LAB — CBC WITH DIFFERENTIAL/PLATELET
Abs Immature Granulocytes: 0.11 10*3/uL — ABNORMAL HIGH (ref 0.00–0.07)
Basophils Absolute: 0.1 10*3/uL (ref 0.0–0.1)
Basophils Relative: 1 %
Eosinophils Absolute: 0.4 10*3/uL (ref 0.0–0.5)
Eosinophils Relative: 4 %
HCT: 38.6 % (ref 36.0–46.0)
Hemoglobin: 12.6 g/dL (ref 12.0–15.0)
Immature Granulocytes: 1 %
Lymphocytes Relative: 34 %
Lymphs Abs: 3.1 10*3/uL (ref 0.7–4.0)
MCH: 30.2 pg (ref 26.0–34.0)
MCHC: 32.6 g/dL (ref 30.0–36.0)
MCV: 92.6 fL (ref 80.0–100.0)
Monocytes Absolute: 0.7 10*3/uL (ref 0.1–1.0)
Monocytes Relative: 8 %
Neutro Abs: 4.7 10*3/uL (ref 1.7–7.7)
Neutrophils Relative %: 52 %
Platelets: 732 10*3/uL — ABNORMAL HIGH (ref 150–400)
RBC: 4.17 MIL/uL (ref 3.87–5.11)
RDW: 13.1 % (ref 11.5–15.5)
WBC: 9.1 10*3/uL (ref 4.0–10.5)
nRBC: 0 % (ref 0.0–0.2)

## 2021-05-02 LAB — LIPASE, BLOOD: Lipase: 28 U/L (ref 11–51)

## 2021-05-02 NOTE — ED Triage Notes (Signed)
Pt states that she has food poison after eating some chicken from [employer]. Pt complains of abdominal pain, vomiting, and diarrhea x 3 days.  ?

## 2021-05-03 LAB — URINALYSIS, ROUTINE W REFLEX MICROSCOPIC
Bilirubin Urine: NEGATIVE
Glucose, UA: NEGATIVE mg/dL
Hgb urine dipstick: NEGATIVE
Ketones, ur: 5 mg/dL — AB
Nitrite: NEGATIVE
Protein, ur: 30 mg/dL — AB
Specific Gravity, Urine: 1.032 — ABNORMAL HIGH (ref 1.005–1.030)
pH: 5 (ref 5.0–8.0)

## 2021-05-03 MED ORDER — ONDANSETRON 4 MG PO TBDP: 4.0000 mg | ORAL_TABLET | Freq: Three times a day (TID) | ORAL | 0 refills | Status: DC | PRN

## 2021-05-03 MED ORDER — DICYCLOMINE HCL 10 MG PO CAPS
20.0000 mg | ORAL_CAPSULE | Freq: Once | ORAL | Status: AC
Start: 2021-05-03 — End: 2021-05-03
  Administered 2021-05-03: 20 mg via ORAL
  Filled 2021-05-03: qty 2

## 2021-05-03 MED ORDER — DICYCLOMINE HCL 20 MG PO TABS: 20.0000 mg | ORAL_TABLET | Freq: Two times a day (BID) | ORAL | 0 refills | Status: DC

## 2021-05-03 MED ORDER — ONDANSETRON 4 MG PO TBDP
4.0000 mg | ORAL_TABLET | Freq: Once | ORAL | Status: AC
  Administered 2021-05-03: 4 mg via ORAL
  Filled 2021-05-03: qty 1

## 2021-05-03 NOTE — ED Notes (Signed)
Pt is currently by vending machine eating chips in the lobby. ?

## 2021-05-03 NOTE — ED Provider Notes (Signed)
?[facility] DEPT ?Provider Note ? ? ?CSN: [id number] ?Arrival date & time: 05/02/21  2217 ? ?  ? ?History ? ?Chief Complaint  ?Patient presents with  ? Emesis  ? Diarrhea  ? Abdominal Pain  ? ? ?Jill Clarke is a 36 y.o. female. ? ? ?Emesis ?Associated symptoms: abdominal pain and diarrhea   ?Diarrhea ?Associated symptoms: abdominal pain and vomiting   ?Abdominal Pain ?Associated symptoms: diarrhea and vomiting   ? ?Patient is a 36 year old female presented emergency room today with 3 days of nausea vomiting diarrhea.  States that she feels that it is related to eating some chicken she states that she thoroughly cooked this.  She has not had any fevers no blood in the stool.  She denies any recent travel.  No hematemesis.  States that her emesis is nonbloody nonbilious. ? ?She denies significant abdominal pain although she does have some cramps occasionally.  No vaginal bleeding no urinary frequency urgency dysuria hematuria.  No chest pain difficulty breathing.  No other associate symptoms. ? ? ?  ? ?Home Medications ?Prior to Admission medications   ?Medication Sig Start Date End Date Taking? Authorizing Provider  ?dicyclomine (BENTYL) 20 MG tablet Take 1 tablet (20 mg total) by mouth 2 (two) times daily. 05/03/21  Yes Navia Lindahl, Kathleene Hazel, PA  ?ondansetron (ZOFRAN-ODT) 4 MG disintegrating tablet Take 1 tablet (4 mg total) by mouth every 8 (eight) hours as needed for nausea or vomiting. 05/03/21  Yes Giovanie Lefebre, Kathleene Hazel, PA  ?apixaban (ELIQUIS) 5 MG TABS tablet Take 1 tablet (5 mg total) by mouth 2 (two) times daily. 03/17/21   Benay Pike, MD  ?aspirin EC 81 MG tablet Take 1 tablet (81 mg total) by mouth daily. Swallow whole. 02/07/21   Frann Rider, NP  ?docusate sodium (COLACE) 100 MG capsule Take 1 capsule (100 mg total) by mouth 2 (two) times daily. 03/09/21   Benay Pike, MD  ?ferrous sulfate 325 (65 FE) MG EC tablet Take 1 tablet (325 mg total) by mouth in the morning and at  bedtime. 03/09/21   Benay Pike, MD  ?   ? ?Allergies    ?Patient has no known allergies.   ? ?Review of Systems   ?Review of Systems  ?Gastrointestinal:  Positive for abdominal pain, diarrhea and vomiting.  ? ?Physical Exam ?Updated Vital Signs ?BP 95/69   Pulse 79   Temp 98.1 ?F (36.7 ?C) (Oral)   Resp 18   Ht 5' (1.524 m)   Wt 70.8 kg   SpO2 100%   BMI 30.47 kg/m?  ?Physical Exam ?Vitals and nursing note reviewed.  ?Constitutional:   ?   General: She is not in acute distress. ?HENT:  ?   Head: Normocephalic and atraumatic.  ?   Nose: Nose normal.  ?   Mouth/Throat:  ?   Mouth: Mucous membranes are moist.  ?Eyes:  ?   General: No scleral icterus. ?Cardiovascular:  ?   Rate and Rhythm: Normal rate and regular rhythm.  ?   Pulses: Normal pulses.  ?   Heart sounds: Normal heart sounds.  ?Pulmonary:  ?   Effort: Pulmonary effort is normal. No respiratory distress.  ?   Breath sounds: No wheezing.  ?Abdominal:  ?   Palpations: Abdomen is soft.  ?   Tenderness: There is no abdominal tenderness. There is no right CVA tenderness, left CVA tenderness, guarding or rebound.  ?Musculoskeletal:  ?   Cervical back: Normal range of motion.  ?  Right lower leg: No edema.  ?   Left lower leg: No edema.  ?Skin: ?   General: Skin is warm and dry.  ?   Capillary Refill: Capillary refill takes less than 2 seconds.  ?Neurological:  ?   Mental Status: She is alert. Mental status is at baseline.  ?Psychiatric:     ?   Mood and Affect: Mood normal.     ?   Behavior: Behavior normal.  ? ? ?ED Results / Procedures / Treatments   ?Labs ?(all labs ordered are listed, but only abnormal results are displayed) ?Labs Reviewed  ?COMPREHENSIVE METABOLIC PANEL - Abnormal; Notable for the following components:  ?    Result Value  ? Glucose, Bld 116 (*)   ? Anion gap 4 (*)   ? All other components within normal limits  ?CBC WITH DIFFERENTIAL/PLATELET - Abnormal; Notable for the following components:  ? Platelets 732 (*)   ? Abs Immature  Granulocytes 0.11 (*)   ? All other components within normal limits  ?URINALYSIS, ROUTINE W REFLEX MICROSCOPIC - Abnormal; Notable for the following components:  ? APPearance HAZY (*)   ? Specific Gravity, Urine 1.032 (*)   ? Ketones, ur 5 (*)   ? Protein, ur 30 (*)   ? Leukocytes,Ua TRACE (*)   ? Bacteria, UA RARE (*)   ? Crystals PRESENT (*)   ? All other components within normal limits  ?LIPASE, BLOOD  ?I-STAT BETA HCG BLOOD, ED ([facility], [facility], [facility] ONLY)  ? ? ?EKG ?None ? ?Radiology ?No results found. ? ?Procedures ?Procedures  ? ? ?Medications Ordered in ED ?Medications  ?ondansetron (ZOFRAN-ODT) disintegrating tablet 4 mg (4 mg Oral Given 05/03/21 0047)  ?dicyclomine (BENTYL) capsule 20 mg (20 mg Oral Given 05/03/21 0047)  ? ? ?ED Course/ Medical Decision Making/ A&P ?  ?                        ?Medical Decision Making ?Amount and/or Complexity of Data Reviewed ?Labs: ordered. ? ?Risk ?Prescription drug management. ? ? ?This patient presents to the ED for concern of nausea vomiting diarrhea, this involves a number of treatment options, and is a complaint that carries with it a moderate risk of complications and morbidity.  The differential diagnosis includes The emergent differential diagnosis for vomiting includes, but is not limited to ACS/MI, Boerhaave's, DKA, Intracranial Hemorrhage, Ischemic bowel, Meningitis, Sepsis, Acute gastric dilation, Acetaminophen toxicity, Adrenal insufficiency, Appendicitis, Aspirin toxicity, Bowel obstruction/ileus, Cholecystitis, CNS tumor. Electrolyte abnormalities, Elevated ICP, Gastric outlet obstruction, Hyperemesis gravidarum, Pancreatitis, Peritonitis, Ruptured viscus, Testicular torsion/ovarian torsion, Biliary colic, Cannabinoid hyperemesis syndrome, Disulfiram effect, ETOH, Gastritis, Gastroenteritis, Gastroparesis, Hepatitis, Ibuprofen, Labyrinthitis, Migraine, Motion sickness, Narcotic withdrawal, Thyroid, Pregnancy, Peptic ulcer disease, Renal colic, and UTI  ? ? ?Co  morbidities: ?Discussed in HPI ? ? ?Brief History: ? ?Patient is a 36 year old female presented emergency room today with 3 days of nausea vomiting diarrhea.  States that she feels that it is related to eating some chicken she states that she thoroughly cooked this.  She has not had any fevers no blood in the stool.  She denies any recent travel.  No hematemesis.  States that her emesis is nonbloody nonbilious. ? ?She denies significant abdominal pain although she does have some cramps occasionally.  No vaginal bleeding no urinary frequency urgency dysuria hematuria.  No chest pain difficulty breathing.  No other associate symptoms. ? ? ?Physical exam unremarkable.   ? ?She declined IV  hydration.  She would prefer p.o. medications.  We will provide Zofran and Bentyl and reassess. ? ? ? ?EMR reviewed including pt PMHx, past surgical history and past visits to ER.  ? ?See HPI for more details ? ? ?Lab Tests: ? ? ?I ordered and independently interpreted labs. Labs notable for normal chemistry CBC without leukocytosis or anemia elevation in platelets likely dehydration.  Hemoglobin is within normal limits.  Urinalysis with rare bacteria and trace leukocytes however no urinary symptoms.  Lipase within normal limits i-STAT hCG negative for pregnancy. ? ? ?Imaging Studies: ? ?No imaging studies ordered for this patient ? ? ? ?Cardiac Monitoring: ? ?NA ?NA ? ? ?Medicines ordered: ? ?I ordered medication including Bentyl Zofran for abdominal cramps and nausea ?Reevaluation of the patient after these medicines showed that the patient resolved ?I have reviewed the patients home medicines and have made adjustments as needed ? ? ?Critical Interventions: ? ? ? ? ?Consults/Attending Physician ? ? ? ? ? ?Reevaluation: ? ?After the interventions noted above I re-evaluated patient and found that they have :resolved ? ? ?Social Determinants of Health: ? ?The patient's social determinants of health were a factor in the care of this  patient ? ? ? ?Problem List / ED Course: ? ?Nausea vomiting diarrhea ?Symptoms likely viral.  Improved with Bentyl and Zofran.  Tolerating p.o.  Will discharge home at this time with close follow-up.  Oral hydration reco

## 2021-05-03 NOTE — Discharge Instructions (Addendum)
Your symptoms are consistent with a viral illness.  Please drink plenty of water eat bland foods which I have printed information for you about.  Specifically bananas, rice, applesauce, toast, avocados ? ?I have prescribed you Zofran for nausea and Bentyl for abdominal pain.  Return to the ER for any new or concerning symptoms.  Otherwise please follow-up with your primary care provider. ?

## 2021-06-08 ENCOUNTER — Ambulatory Visit: Payer: Medicaid Other | Admitting: Neurology

## 2021-06-08 DIAGNOSIS — G4733 Obstructive sleep apnea (adult) (pediatric): Secondary | ICD-10-CM

## 2021-06-08 DIAGNOSIS — G4719 Other hypersomnia: Secondary | ICD-10-CM

## 2021-06-08 DIAGNOSIS — R569 Unspecified convulsions: Secondary | ICD-10-CM

## 2021-06-08 DIAGNOSIS — R0683 Snoring: Secondary | ICD-10-CM

## 2021-06-08 DIAGNOSIS — Z8673 Personal history of transient ischemic attack (TIA), and cerebral infarction without residual deficits: Secondary | ICD-10-CM

## 2021-06-08 DIAGNOSIS — R0681 Apnea, not elsewhere classified: Secondary | ICD-10-CM

## 2021-06-08 DIAGNOSIS — D75839 Thrombocytosis, unspecified: Secondary | ICD-10-CM

## 2021-06-09 ENCOUNTER — Telehealth: Payer: Self-pay

## 2021-06-09 NOTE — Telephone Encounter (Signed)
Spoke with patient about not receiving her sleep device back and she said she is going to bring it now and place it in the drop box - pt aware it has to be returned today

## 2021-06-14 NOTE — Addendum Note (Signed)
Addended by: Star Age on: 06/14/2021 05:38 PM   Modules accepted: Orders

## 2021-06-14 NOTE — Procedures (Signed)
   [facility]  HOME SLEEP TEST (Watch PAT) REPORT  STUDY DATE: 06/08/2021  DOB: 1985-12-17  MRN: [id number]  ORDERING CLINICIAN: Star Age, MD, PhD   REFERRING CLINICIAN: Dr. [name]  CLINICAL INFORMATION/HISTORY: 36 year old right-handed woman with an underlying medical history of cerebral venous sinus thrombosis, seizure disorder, thrombocytosis, sickle cell trait, asthma, diabetes, and borderline obesity, who reports snoring and excessive daytime somnolence.  Epworth sleepiness score: 14/24.  BMI: 30.7 kg/m  FINDINGS:   Sleep Summary:   Total Recording Time (hours, min): 10 hours, 5 minutes  Total Sleep Time (hours, min):  7 hours, 10 minutes   Percent REM (%):    22.9%   Respiratory Indices:   Calculated pAHI (per hour):  16.3/hour         REM pAHI:    37.4/hour       NREM pAHI: 10.1/hour  Oxygen Saturation Statistics:    Oxygen Saturation (%) Mean: 95%   Minimum oxygen saturation (%):                 84%   O2 Saturation Range (%): 84-100%    O2 Saturation (minutes) <=88%: 0.2 min  Pulse Rate Statistics:   Pulse Mean (bpm):    84/min    Pulse Range (67-125/min)   IMPRESSION: OSA (obstructive sleep apnea)   RECOMMENDATION:  This home sleep test demonstrates moderate obstructive sleep apnea with a total AHI of 16.3/hour and O2 nadir of 84%.  Mild to moderate snoring was detected. Treatment with positive airway pressure is recommended. The patient will be advised to proceed with an autoPAP titration/trial at home for now. A full night titration study may be considered to optimize treatment settings, if needed down the road.  Alternative treatment options may include a dental device with the help of a dentist or orthodontist.  Concomitant weight loss may help reduce the severity of her sleep apnea and improve her snoring.  Please note that untreated obstructive sleep apnea may carry additional perioperative morbidity. Patients with  significant obstructive sleep apnea should receive perioperative PAP therapy and the surgeons and particularly the anesthesiologist should be informed of the diagnosis and the severity of the sleep disordered breathing. The patient should be cautioned not to drive, work at heights, or operate dangerous or heavy equipment when tired or sleepy. Review and reiteration of good sleep hygiene measures should be pursued with any patient. Other causes of the patient's symptoms, including circadian rhythm disturbances, an underlying mood disorder, medication effect and/or an underlying medical problem cannot be ruled out based on this test. Clinical correlation is recommended. The patient and her referring provider will be notified of the test results. The patient will be seen in follow up in sleep clinic at [facility].  I certify that I have reviewed the raw data recording prior to the issuance of this report in accordance with the standards of the American Academy of Sleep Medicine (AASM).  INTERPRETING PHYSICIAN:   Star Age, MD, PhD  Board Certified in Neurology and Sleep Medicine  [facility] [address], [place], [place] [address] [phone]

## 2021-06-15 ENCOUNTER — Telehealth: Payer: Self-pay | Admitting: *Deleted

## 2021-06-15 ENCOUNTER — Encounter: Payer: Self-pay | Admitting: *Deleted

## 2021-06-15 NOTE — Telephone Encounter (Signed)
-----   Message from Star Age, MD sent at 06/14/2021  5:38 PM EDT ----- Patient referred by [name] and Dr. [name], seen by me on 04/20/2021, patient had a HST on 06/08/2021.    Please call and notify the patient that the recent home sleep test showed obstructive sleep apnea in the moderate range. I recommend treatment in the form of autoPAP, which means, that we don't have to bring her in for a sleep study with CPAP, but will let her start using a so called autoPAP machine at home, which is a CPAP-like machine with self-adjusting pressures. We will send the order to a local DME company (of her choice, or as per insurance requirement). The DME representative will fit her with a mask, educate her on how to use the machine, how to put the mask on, etc. I have placed an order in the chart. Please send the order, talk to patient, send report to referring MD. We will need a FU in sleep clinic for 10 weeks post-PAP set up, please arrange that with me or one of our NPs. Also reinforce the need for compliance with treatment. Thanks,   Star Age, MD, PhD [facility])

## 2021-06-15 NOTE — Telephone Encounter (Signed)
I called pt. I advised pt that Dr. Rexene Alberts  reviewed their sleep study results and found that pt has moderate OSA . Dr. Rexene Alberts recommends that pt start APAP. I reviewed PAP compliance expectations with the pt. Pt is agreeable to starting an auto-PAP. I advised pt that an order will be sent to a DME, [facility], and they will call the pt within about one week after they file with the pt's insurance. [facility] will show the pt how to use the machine, fit for masks, and troubleshoot the auto-PAP if needed. A follow up appt was made for insurance purposes with Dr. Ward Givens NP or other provider on 09-12-2021 at 0830. Pt verbalized understanding to arrive 15 minutes early and bring their auto-PAP. A letter with all of this information in it will be mailed to the pt as a reminder. I verified with the pt that the address we have on file is correct. Pt verbalized understanding of results. Pt had no questions at this time but was encouraged to call back if questions arise. I have sent the order to [facility] and have received confirmation that they have received the order. Mailed letter to pt awell with above.

## 2021-07-12 DIAGNOSIS — G4733 Obstructive sleep apnea (adult) (pediatric): Secondary | ICD-10-CM | POA: Diagnosis not present

## 2021-07-25 ENCOUNTER — Ambulatory Visit (INDEPENDENT_AMBULATORY_CARE_PROVIDER_SITE_OTHER): Payer: Medicaid Other | Admitting: Family Medicine

## 2021-07-25 ENCOUNTER — Encounter: Payer: Self-pay | Admitting: Family Medicine

## 2021-07-25 VITALS — BP 124/85 | HR 86 | Ht 60.0 in | Wt 158.6 lb

## 2021-07-25 DIAGNOSIS — Z411 Encounter for cosmetic surgery: Secondary | ICD-10-CM

## 2021-07-25 DIAGNOSIS — Z1159 Encounter for screening for other viral diseases: Secondary | ICD-10-CM

## 2021-07-25 DIAGNOSIS — E559 Vitamin D deficiency, unspecified: Secondary | ICD-10-CM

## 2021-07-25 DIAGNOSIS — Z0001 Encounter for general adult medical examination with abnormal findings: Secondary | ICD-10-CM

## 2021-07-25 DIAGNOSIS — R519 Headache, unspecified: Secondary | ICD-10-CM | POA: Diagnosis not present

## 2021-07-25 DIAGNOSIS — R569 Unspecified convulsions: Secondary | ICD-10-CM

## 2021-07-25 DIAGNOSIS — R7301 Impaired fasting glucose: Secondary | ICD-10-CM

## 2021-07-25 NOTE — Assessment & Plan Note (Signed)
-   current breast size is 36G. She reports having back and shoulder pain from her enlarged breast - she will like to get a breast reduction -referral placed

## 2021-07-25 NOTE — Patient Instructions (Signed)
I appreciate the opportunity to provide care to you today!    Follow up:  2 weeks for pap smear  Labs: please stop by the lab during the week to get your blood drawn (CBC, CMP, TSH, Lipid profile, HgA1c, Vit D)  Screening: Hep C   Referrals today- General surgery   Please continue to a heart-healthy diet and increase your physical activities. Try to exercise for 14mns at least three times a week.      It was a pleasure to see you and I look forward to continuing to work together on your health and well-being. Please do not hesitate to call the office if you need care or have questions about your care.   Have a wonderful day and week. With Gratitude, GAlvira MondayMSN, FNP-BC

## 2021-07-25 NOTE — Assessment & Plan Note (Signed)
Reports not taking her keppra She is following up with neurology

## 2021-07-25 NOTE — Progress Notes (Signed)
New Patient Office Visit  Subjective:  Patient ID: Jill Clarke, female    DOB: 1985/02/28  Age: 36 y.o. MRN: [id number]  CC:  Chief Complaint  Patient presents with   New Patient (Initial Visit)    Pt establishing care, would like to discuss headaches, and sugar levels needs blood work, due for pap smear, would like to discuss breast concerns    HPI Jill Clarke is a 36 y.o. female with past medical history of cerebral venous sinus thrombosis, seizure disorder, thrombocytosis, sickle cell trait, asthma, diabetes, and borderline obesity presents for establishing care.  Breast augmentation: current breast size is 36G. She reports having back pain,  shoulder pain and poor posture from her enlarged breast. She will like to get a breast reduction.  Headaches: reports occasional headaches since her Interventional radiology procedure on 02/13/20. She admits to not drinking enough water. No meds were taken for pain relief. She rates pain 10/10.   Pt has a hx of stroke and reports not taking her eliquis and baby aspirin for 1 year. She does follow up with neurology.   Past Medical History:  Diagnosis Date   Asthma    uses inhaler prn   Diabetes mellitus without complication (HCC)    Sickle cell trait ([place])    Stroke ([place])    Unspecified high-risk pregnancy 01/27/2011    Past Surgical History:  Procedure Laterality Date   IR ANGIO INTRA EXTRACRAN SEL COM CAROTID INNOMINATE UNI R MOD SED  02/13/2020   IR ANGIO INTRA EXTRACRAN SEL COM CAROTID INNOMINATE UNI R MOD SED  02/15/2020   IR ANGIO INTRA EXTRACRAN SEL INTERNAL CAROTID UNI L MOD SED  02/13/2020   IR ANGIO VERTEBRAL SEL VERTEBRAL UNI R MOD SED  02/13/2020   IR CT HEAD LTD  02/13/2020   IR CT HEAD LTD  02/15/2020   IR PTA VENOUS EXCEPT DIALYSIS CIRCUIT  02/15/2020   IR THROMBECT VENO MECH MOD SED  02/13/2020   IR THROMBECT VENO MECH REPT MOD SED  02/15/2020   RADIOLOGY WITH ANESTHESIA N/A 02/13/2020   Procedure: IR WITH  ANESTHESIA;  Surgeon: Luanne Bras, MD;  Location: [facility];  Service: Radiology;  Laterality: N/A;   RADIOLOGY WITH ANESTHESIA N/A 02/15/2020   Procedure: IR WITH ANESTHESIA;  Surgeon: Radiologist, Medication, MD;  Location: [facility];  Service: Radiology;  Laterality: N/A;   TONSILLECTOMY     WISDOM TOOTH EXTRACTION      Family History  Problem Relation Age of Onset   Sickle cell trait Mother    Depression Mother    Asthma Father    Drug abuse Father        heroin abuse   Kidney disease Father        due to heroin abuse   Diabetes Maternal Grandmother    Sleep apnea Neg Hx    Snoring Neg Hx     Social History   Socioeconomic History   Marital status: Single    Spouse name: Not on file   Number of children: 2   Years of education: Not on file   Highest education level: Not on file  Occupational History   Not on file  Tobacco Use   Smoking status: Former   Smokeless tobacco: Never  Substance and Sexual Activity   Alcohol use: No   Drug use: No   Sexual activity: Yes    Birth control/protection: None, Condom, Abstinence    Comment: h/o implant, depo  Other Topics Concern  Not on file  Social History Narrative   Lives with mother and her 2 kids   Left Handed   None    Social Determinants of Health   Financial Resource Strain: Not on file  Food Insecurity: Not on file  Transportation Needs: Not on file  Physical Activity: Not on file  Stress: Not on file  Social Connections: Not on file  Intimate Partner Violence: Not on file    ROS Review of Systems  Constitutional:  Negative for chills, fatigue and fever.  HENT:  Negative for rhinorrhea, sinus pressure and sinus pain.   Eyes:  Negative for photophobia, pain and redness.  Respiratory:  Negative for chest tightness and shortness of breath.   Cardiovascular:  Negative for chest pain and palpitations.  Gastrointestinal:  Negative for blood in stool and constipation.  Endocrine: Negative for polydipsia,  polyphagia and polyuria.  Genitourinary:  Negative for frequency and urgency.  Musculoskeletal:  Positive for back pain.  Skin:  Negative for rash and wound.  Neurological:  Positive for headaches.  Psychiatric/Behavioral:  Negative for self-injury and suicidal ideas.     Objective:   Today's Vitals: BP 124/85   Pulse 86   Ht 5' (1.524 m)   Wt 158 lb 9.6 oz (71.9 kg)   SpO2 96%   BMI 30.97 kg/m   Physical Exam HENT:     Head: Normocephalic.     Right Ear: External ear normal.     Left Ear: External ear normal.     Nose: No congestion or rhinorrhea.     Mouth/Throat:     Mouth: Mucous membranes are moist.  Eyes:     Extraocular Movements: Extraocular movements intact.     Pupils: Pupils are equal, round, and reactive to light.  Cardiovascular:     Rate and Rhythm: Normal rate and regular rhythm.  Pulmonary:     Effort: Pulmonary effort is normal.     Breath sounds: Normal breath sounds.  Abdominal:     Palpations: Abdomen is soft.  Musculoskeletal:     Cervical back: No rigidity.     Right lower leg: No edema.     Left lower leg: No edema.  Skin:    General: Skin is warm.  Neurological:     Mental Status: She is alert and oriented to person, place, and time.  Psychiatric:     Comments: Normal affect     Assessment & Plan:   Problem List Items Addressed This Visit       Other   Encounter for breast augmentation - Primary    - current breast size is 36G. She reports having back and shoulder pain from her enlarged breast - she will like to get a breast reduction -referral placed      Relevant Orders   Ambulatory referral to Plastic Surgery   Seizures [facility])    Reports not taking her keppra She is following up with neurology      Frequent headaches    -reports occasional headaches since her Interventional radiology procedure on 02/13/20 -she admits to not drinking enough water -no meds were taken for pain relief -she rates pain 10/10 -encouraged  increasing fluids intake and taking tylenol as needed      Other Visit Diagnoses     Encounter for general adult medical examination with abnormal findings       Relevant Orders   CBC with Differential/Platelet   CMP14+EGFR   TSH + free T4   Lipid panel  IFG (impaired fasting glucose)       Relevant Orders   Hemoglobin A1C   Vitamin D deficiency       Relevant Orders   Vitamin D (25 hydroxy)   Need for hepatitis C screening test       Relevant Orders   Hepatitis C Antibody       Outpatient Encounter Medications as of 07/25/2021  Medication Sig   apixaban (ELIQUIS) 5 MG TABS tablet Take 1 tablet (5 mg total) by mouth 2 (two) times daily. (Patient not taking: Reported on 07/25/2021)   aspirin EC 81 MG tablet Take 1 tablet (81 mg total) by mouth daily. Swallow whole. (Patient not taking: Reported on 07/25/2021)   dicyclomine (BENTYL) 20 MG tablet Take 1 tablet (20 mg total) by mouth 2 (two) times daily. (Patient not taking: Reported on 07/25/2021)   docusate sodium (COLACE) 100 MG capsule Take 1 capsule (100 mg total) by mouth 2 (two) times daily. (Patient not taking: Reported on 07/25/2021)   ferrous sulfate 325 (65 FE) MG EC tablet Take 1 tablet (325 mg total) by mouth in the morning and at bedtime. (Patient not taking: Reported on 07/25/2021)   ondansetron (ZOFRAN-ODT) 4 MG disintegrating tablet Take 1 tablet (4 mg total) by mouth every 8 (eight) hours as needed for nausea or vomiting. (Patient not taking: Reported on 07/25/2021)   No facility-administered encounter medications on file as of 07/25/2021.    Follow-up: Return in about 2 weeks (around 08/08/2021) for pap smear.   Alvira Monday, FNP

## 2021-07-25 NOTE — Assessment & Plan Note (Signed)
-  reports occasional headaches since her Interventional radiology procedure on 02/13/20 -she admits to not drinking enough water -no meds were taken for pain relief -she rates pain 10/10 -encouraged increasing fluids intake and taking tylenol as needed

## 2021-07-26 LAB — CMP14+EGFR
ALT: 12 IU/L (ref 0–32)
AST: 14 IU/L (ref 0–40)
Albumin/Globulin Ratio: 1.4 (ref 1.2–2.2)
Albumin: 4.2 g/dL (ref 3.8–4.8)
Alkaline Phosphatase: 79 IU/L (ref 44–121)
BUN/Creatinine Ratio: 21 (ref 9–23)
BUN: 10 mg/dL (ref 6–20)
Bilirubin Total: 0.4 mg/dL (ref 0.0–1.2)
CO2: 23 mmol/L (ref 20–29)
Calcium: 9.4 mg/dL (ref 8.7–10.2)
Chloride: 103 mmol/L (ref 96–106)
Creatinine, Ser: 0.48 mg/dL — ABNORMAL LOW (ref 0.57–1.00)
Globulin, Total: 3 g/dL (ref 1.5–4.5)
Glucose: 80 mg/dL (ref 70–99)
Potassium: 4.4 mmol/L (ref 3.5–5.2)
Sodium: 140 mmol/L (ref 134–144)
Total Protein: 7.2 g/dL (ref 6.0–8.5)
eGFR: 126 mL/min/{1.73_m2} (ref >59)

## 2021-07-26 LAB — VITAMIN D 25 HYDROXY (VIT D DEFICIENCY, FRACTURES): Vit D, 25-Hydroxy: 17.4 ng/mL — ABNORMAL LOW (ref 30.0–100.0)

## 2021-07-26 LAB — LIPID PANEL
Chol/HDL Ratio: 2.8 ratio (ref 0.0–4.4)
Cholesterol, Total: 162 mg/dL (ref 100–199)
HDL: 57 mg/dL (ref >39)
LDL Chol Calc (NIH): 94 mg/dL (ref 0–99)
Triglycerides: 52 mg/dL (ref 0–149)
VLDL Cholesterol Cal: 11 mg/dL (ref 5–40)

## 2021-07-26 LAB — HEPATITIS C ANTIBODY: Hep C Virus Ab: UNDETERMINED — AB

## 2021-07-26 LAB — CBC WITH DIFFERENTIAL/PLATELET
Basophils Absolute: 0 10*3/uL (ref 0.0–0.2)
Basos: 1 %
EOS (ABSOLUTE): 0.3 10*3/uL (ref 0.0–0.4)
Eos: 4 %
Hematocrit: 35.2 % (ref 34.0–46.6)
Hemoglobin: 11.8 g/dL (ref 11.1–15.9)
Immature Grans (Abs): 0.1 10*3/uL (ref 0.0–0.1)
Immature Granulocytes: 1 %
Lymphocytes Absolute: 2.3 10*3/uL (ref 0.7–3.1)
Lymphs: 34 %
MCH: 30.5 pg (ref 26.6–33.0)
MCHC: 33.5 g/dL (ref 31.5–35.7)
MCV: 91 fL (ref 79–97)
Monocytes Absolute: 0.5 10*3/uL (ref 0.1–0.9)
Monocytes: 8 %
Neutrophils Absolute: 3.6 10*3/uL (ref 1.4–7.0)
Neutrophils: 52 %
Platelets: 793 10*3/uL — ABNORMAL HIGH (ref 150–450)
RBC: 3.87 x10E6/uL (ref 3.77–5.28)
RDW: 12.3 % (ref 11.7–15.4)
WBC: 6.8 10*3/uL (ref 3.4–10.8)

## 2021-07-26 LAB — TSH+FREE T4
Free T4: 1.02 ng/dL (ref 0.82–1.77)
TSH: 2.33 u[IU]/mL (ref 0.450–4.500)

## 2021-07-26 LAB — HEMOGLOBIN A1C
Est. average glucose Bld gHb Est-mCnc: 97 mg/dL
Hgb A1c MFr Bld: 5 % (ref 4.8–5.6)

## 2021-07-27 ENCOUNTER — Other Ambulatory Visit: Payer: Self-pay | Admitting: Family Medicine

## 2021-07-27 DIAGNOSIS — Z1159 Encounter for screening for other viral diseases: Secondary | ICD-10-CM

## 2021-07-27 DIAGNOSIS — D75839 Thrombocytosis, unspecified: Secondary | ICD-10-CM

## 2021-07-27 DIAGNOSIS — D696 Thrombocytopenia, unspecified: Secondary | ICD-10-CM

## 2021-07-27 DIAGNOSIS — E559 Vitamin D deficiency, unspecified: Secondary | ICD-10-CM

## 2021-07-27 MED ORDER — VITAMIN D (ERGOCALCIFEROL) 1.25 MG (50000 UNIT) PO CAPS: 50000.0000 [IU] | ORAL_CAPSULE | ORAL | 1 refills | Status: AC

## 2021-07-27 NOTE — Progress Notes (Signed)
Please inform the patient that a referral has been placed to hematology for her elevated platelets count.  I sent a prescription for Vit.D supplement to her pharmacy. Her vit d is low.   Please inform the patient to get labs to confirm that she doesn't have Hep C.

## 2021-08-01 ENCOUNTER — Telehealth: Payer: Self-pay | Admitting: Hematology and Oncology

## 2021-08-01 NOTE — Telephone Encounter (Signed)
Scheduled appt per 7/6 referral. Pt was already established with Dr. Chryl Heck. Pt is aware of appt date and time. Pt is aware to arrive 15 mins prior to appt time and to bring and updated insurance card. Pt is aware of appt location.

## 2021-08-07 ENCOUNTER — Other Ambulatory Visit: Payer: Self-pay | Admitting: Family Medicine

## 2021-08-07 DIAGNOSIS — A599 Trichomoniasis, unspecified: Secondary | ICD-10-CM

## 2021-08-07 MED ORDER — METRONIDAZOLE 500 MG PO TABS: 500.0000 mg | ORAL_TABLET | Freq: Two times a day (BID) | ORAL | 0 refills | Status: AC

## 2021-08-11 DIAGNOSIS — G4733 Obstructive sleep apnea (adult) (pediatric): Secondary | ICD-10-CM | POA: Diagnosis not present

## 2021-08-15 ENCOUNTER — Other Ambulatory Visit (HOSPITAL_COMMUNITY)
Admission: RE | Admit: 2021-08-15 | Discharge: 2021-08-15 | Disposition: A | Discharge status: Home / Self Care | Payer: Medicaid Other | Source: Ambulatory Visit | Attending: Internal Medicine | Admitting: Internal Medicine

## 2021-08-15 ENCOUNTER — Encounter: Payer: Self-pay | Admitting: Family Medicine

## 2021-08-15 ENCOUNTER — Ambulatory Visit (INDEPENDENT_AMBULATORY_CARE_PROVIDER_SITE_OTHER): Payer: Medicaid Other | Admitting: Family Medicine

## 2021-08-15 VITALS — BP 128/72 | HR 95 | Ht 61.0 in | Wt 158.2 lb

## 2021-08-15 DIAGNOSIS — Z124 Encounter for screening for malignant neoplasm of cervix: Secondary | ICD-10-CM | POA: Insufficient documentation

## 2021-08-15 NOTE — Assessment & Plan Note (Signed)
-   According to the American Cancer Society, cytology and HPV co-testing (preferred) every 5 years or cytology alone (acceptable) every 3 years. - Pap due 2026  

## 2021-08-15 NOTE — Progress Notes (Signed)
Established Patient Office Visit  Subjective:  Patient ID: Jill Clarke, female    DOB: 13-Sep-1985  Age: 36 y.o. MRN: [id number]  CC:  Chief Complaint  Patient presents with   Follow-up    2 week follow up. PAP     HPI Jill Clarke is a 36 y.o. female with past medical history of  asthma, GERD presents for for a pap smear. She has irregular menstrual cycles. She is currently in a monogamous relationship with her boyfriend of 6 years. She was last intimate with her partner 2 weeks ago.  Past Medical History:  Diagnosis Date   Asthma    uses inhaler prn   Diabetes mellitus without complication (HCC)    Sickle cell trait ([place])    Stroke ([place])    Unspecified high-risk pregnancy 01/27/2011    Past Surgical History:  Procedure Laterality Date   IR ANGIO INTRA EXTRACRAN SEL COM CAROTID INNOMINATE UNI R MOD SED  02/13/2020   IR ANGIO INTRA EXTRACRAN SEL COM CAROTID INNOMINATE UNI R MOD SED  02/15/2020   IR ANGIO INTRA EXTRACRAN SEL INTERNAL CAROTID UNI L MOD SED  02/13/2020   IR ANGIO VERTEBRAL SEL VERTEBRAL UNI R MOD SED  02/13/2020   IR CT HEAD LTD  02/13/2020   IR CT HEAD LTD  02/15/2020   IR PTA VENOUS EXCEPT DIALYSIS CIRCUIT  02/15/2020   IR THROMBECT VENO MECH MOD SED  02/13/2020   IR THROMBECT VENO MECH REPT MOD SED  02/15/2020   RADIOLOGY WITH ANESTHESIA N/A 02/13/2020   Procedure: IR WITH ANESTHESIA;  Surgeon: Luanne Bras, MD;  Location: [facility];  Service: Radiology;  Laterality: N/A;   RADIOLOGY WITH ANESTHESIA N/A 02/15/2020   Procedure: IR WITH ANESTHESIA;  Surgeon: Radiologist, Medication, MD;  Location: [facility] [facility];  Service: Radiology;  Laterality: N/A;   TONSILLECTOMY     WISDOM TOOTH EXTRACTION      Family History  Problem Relation Age of Onset   Sickle cell trait Mother    Depression Mother    Asthma Father    Drug abuse Father        heroin abuse   Kidney disease Father        due to heroin abuse   Diabetes Maternal Grandmother    Sleep apnea Neg Hx     Snoring Neg Hx     Social History   Socioeconomic History   Marital status: Single    Spouse name: Not on file   Number of children: 2   Years of education: Not on file   Highest education level: Not on file  Occupational History   Not on file  Tobacco Use   Smoking status: Former   Smokeless tobacco: Never  Substance and Sexual Activity   Alcohol use: No   Drug use: No   Sexual activity: Yes    Birth control/protection: None, Condom, Abstinence    Comment: h/o implant, depo  Other Topics Concern   Not on file  Social History Narrative   Lives with mother and her 2 kids   Left Handed   None    Social Determinants of Health   Financial Resource Strain: Not on file  Food Insecurity: Not on file  Transportation Needs: Not on file  Physical Activity: Not on file  Stress: Not on file  Social Connections: Not on file  Intimate Partner Violence: Not on file    Outpatient Medications Prior to Visit  Medication Sig Dispense Refill  Vitamin D, Ergocalciferol, (DRISDOL) 1.25 MG (50000 UNIT) CAPS capsule Take 1 capsule (50,000 Units total) by mouth every 7 (seven) days. 5 capsule 1   apixaban (ELIQUIS) 5 MG TABS tablet Take 1 tablet (5 mg total) by mouth 2 (two) times daily. (Patient not taking: Reported on 07/25/2021) 60 tablet 2   aspirin EC 81 MG tablet Take 1 tablet (81 mg total) by mouth daily. Swallow whole. (Patient not taking: Reported on 07/25/2021) 30 tablet 11   dicyclomine (BENTYL) 20 MG tablet Take 1 tablet (20 mg total) by mouth 2 (two) times daily. (Patient not taking: Reported on 07/25/2021) 20 tablet 0   docusate sodium (COLACE) 100 MG capsule Take 1 capsule (100 mg total) by mouth 2 (two) times daily. (Patient not taking: Reported on 07/25/2021) 10 capsule 0   ferrous sulfate 325 (65 FE) MG EC tablet Take 1 tablet (325 mg total) by mouth in the morning and at bedtime. (Patient not taking: Reported on 07/25/2021) 60 tablet 3   ondansetron (ZOFRAN-ODT) 4 MG  disintegrating tablet Take 1 tablet (4 mg total) by mouth every 8 (eight) hours as needed for nausea or vomiting. (Patient not taking: Reported on 07/25/2021) 20 tablet 0   No facility-administered medications prior to visit.    No Known Allergies  ROS Review of Systems  Constitutional:  Negative for fatigue and fever.  Endocrine: Negative for polydipsia, polyphagia and polyuria.  Skin:  Negative for rash and wound.  Psychiatric/Behavioral:  Negative for self-injury and suicidal ideas.       Objective:    Physical Exam HENT:     Head: Normocephalic.  Cardiovascular:     Rate and Rhythm: Normal rate and regular rhythm.     Pulses: Normal pulses.     Heart sounds: Normal heart sounds.  Pulmonary:     Effort: Pulmonary effort is normal.     Breath sounds: Normal breath sounds.  Genitourinary:    Exam position: Lithotomy position.     Pubic Area: No rash or pubic lice.      Tanner stage (genital): 5.     Labia:        Right: No rash, tenderness, lesion or injury.        Left: No rash, tenderness, lesion or injury.      Comments: Vaginal wall: pink and rugated, smooth and non-tender; absence of lesions, edema, and erythema. Labia Majora and Minora: present bilaterally, moist, soft tissue, and homogeneous; free of edema and ulcerations. Clitoris is anatomically present, above the urethral, and free of lesions, masses, and ulceration.   Neurological:     Mental Status: She is alert.     BP 128/72   Pulse 95   Ht _0  (1.549 m)   Wt 158 lb 3.2 oz (71.8 kg)   SpO2 95%   BMI 29.89 kg/m  Wt Readings from Last 3 Encounters:  08/15/21 158 lb 3.2 oz (71.8 kg)  07/25/21 158 lb 9.6 oz (71.9 kg)  05/02/21 156 lb (70.8 kg)    Lab Results  Component Value Date   TSH 2.330 07/25/2021   Lab Results  Component Value Date   WBC 6.8 07/25/2021   HGB 11.8 07/25/2021   HCT 35.2 07/25/2021   MCV 91 07/25/2021   PLT 793 (H) 07/25/2021   Lab Results  Component Value Date   NA  140 07/25/2021   K 4.4 07/25/2021   CO2 23 07/25/2021   GLUCOSE 80 07/25/2021   BUN 10 07/25/2021  CREATININE 0.48 (L) 07/25/2021   BILITOT 0.4 07/25/2021   ALKPHOS 79 07/25/2021   AST 14 07/25/2021   ALT 12 07/25/2021   PROT 7.2 07/25/2021   ALBUMIN 4.2 07/25/2021   CALCIUM 9.4 07/25/2021   ANIONGAP 4 (L) 05/02/2021   EGFR 126 07/25/2021   Lab Results  Component Value Date   CHOL 162 07/25/2021   Lab Results  Component Value Date   HDL 57 07/25/2021   Lab Results  Component Value Date   LDLCALC 94 07/25/2021   Lab Results  Component Value Date   TRIG 52 07/25/2021   Lab Results  Component Value Date   CHOLHDL 2.8 07/25/2021   Lab Results  Component Value Date   HGBA1C 5.0 07/25/2021      Assessment & Plan:   Problem List Items Addressed This Visit       Other   Cervical cancer screening - Primary    - According to the [facility], cytology and HPV co-testing (preferred) every 5 years or cytology alone (acceptable) every 3 years. - Pap due 2026       Relevant Orders   Cytology - PAP    No orders of the defined types were placed in this encounter.   Follow-up: Return in about 3 months (around 11/15/2021) for CPE.    Alvira Monday, FNP

## 2021-08-15 NOTE — Patient Instructions (Signed)
I appreciate the opportunity to provide care to you today!  F/U: 3 months CPE   Please continue to a heart-healthy diet and increase your physical activities. Try to exercise for 44mns at least three times a week.      It was a pleasure to see you and I look forward to continuing to work together on your health and well-being. Please do not hesitate to call the office if you need care or have questions about your care.   Have a wonderful day and week. With Gratitude, GAlvira MondayMSN, FNP-BC

## 2021-08-19 ENCOUNTER — Inpatient Hospital Stay: Payer: Medicaid Other | Attending: Hematology and Oncology | Admitting: Hematology and Oncology

## 2021-08-19 ENCOUNTER — Other Ambulatory Visit: Payer: Self-pay | Admitting: Family Medicine

## 2021-08-19 DIAGNOSIS — R87610 Atypical squamous cells of undetermined significance on cytologic smear of cervix (ASC-US): Secondary | ICD-10-CM

## 2021-08-19 LAB — CYTOLOGY - PAP
Chlamydia: NEGATIVE
Comment: NEGATIVE
Comment: NEGATIVE
Comment: NORMAL
Diagnosis: UNDETERMINED — AB
Neisseria Gonorrhea: NEGATIVE
Trichomonas: NEGATIVE

## 2021-08-19 NOTE — Progress Notes (Signed)
Please inform the patient a referral has been placed to gyn regarding abnormal findings on her pap.  Her STD testing was negative

## 2021-08-29 ENCOUNTER — Institutional Professional Consult (permissible substitution): Payer: Medicaid Other | Admitting: Plastic Surgery

## 2021-09-06 ENCOUNTER — Ambulatory Visit (INDEPENDENT_AMBULATORY_CARE_PROVIDER_SITE_OTHER): Payer: Medicaid Other | Admitting: Primary Care

## 2021-09-08 ENCOUNTER — Encounter: Payer: Self-pay | Admitting: *Deleted

## 2021-09-11 DIAGNOSIS — G4733 Obstructive sleep apnea (adult) (pediatric): Secondary | ICD-10-CM | POA: Diagnosis not present

## 2021-09-12 ENCOUNTER — Ambulatory Visit: Payer: Medicaid Other | Admitting: Adult Health

## 2021-09-12 ENCOUNTER — Encounter: Payer: Self-pay | Admitting: Adult Health

## 2021-09-30 ENCOUNTER — Encounter: Payer: Medicaid Other | Admitting: Obstetrics & Gynecology

## 2021-10-11 ENCOUNTER — Encounter: Payer: Self-pay | Admitting: *Deleted

## 2021-10-12 DIAGNOSIS — G4733 Obstructive sleep apnea (adult) (pediatric): Secondary | ICD-10-CM | POA: Diagnosis not present

## 2021-10-26 ENCOUNTER — Emergency Department (HOSPITAL_COMMUNITY): Payer: Medicaid Other

## 2021-10-26 ENCOUNTER — Encounter (HOSPITAL_COMMUNITY): Payer: Self-pay | Admitting: *Deleted

## 2021-10-26 ENCOUNTER — Emergency Department (HOSPITAL_COMMUNITY)
Admission: EM | Admit: 2021-10-26 | Discharge: 2021-10-26 | Disposition: A | Discharge status: Home / Self Care | Payer: Medicaid Other | Attending: Emergency Medicine | Admitting: Emergency Medicine

## 2021-10-26 ENCOUNTER — Other Ambulatory Visit: Payer: Self-pay

## 2021-10-26 DIAGNOSIS — R9431 Abnormal electrocardiogram [ECG] [EKG]: Secondary | ICD-10-CM | POA: Diagnosis not present

## 2021-10-26 DIAGNOSIS — Z7901 Long term (current) use of anticoagulants: Secondary | ICD-10-CM | POA: Diagnosis not present

## 2021-10-26 DIAGNOSIS — R519 Headache, unspecified: Secondary | ICD-10-CM | POA: Diagnosis not present

## 2021-10-26 DIAGNOSIS — R5383 Other fatigue: Secondary | ICD-10-CM | POA: Diagnosis not present

## 2021-10-26 DIAGNOSIS — N760 Acute vaginitis: Secondary | ICD-10-CM | POA: Diagnosis not present

## 2021-10-26 DIAGNOSIS — B9689 Other specified bacterial agents as the cause of diseases classified elsewhere: Secondary | ICD-10-CM | POA: Diagnosis not present

## 2021-10-26 DIAGNOSIS — R42 Dizziness and giddiness: Secondary | ICD-10-CM | POA: Diagnosis not present

## 2021-10-26 LAB — CBC WITH DIFFERENTIAL/PLATELET
Abs Immature Granulocytes: 0.05 10*3/uL (ref 0.00–0.07)
Basophils Absolute: 0.1 10*3/uL (ref 0.0–0.1)
Basophils Relative: 1 %
Eosinophils Absolute: 0.3 10*3/uL (ref 0.0–0.5)
Eosinophils Relative: 3 %
HCT: 36.1 % (ref 36.0–46.0)
Hemoglobin: 11.6 g/dL — ABNORMAL LOW (ref 12.0–15.0)
Immature Granulocytes: 1 %
Lymphocytes Relative: 33 %
Lymphs Abs: 2.5 10*3/uL (ref 0.7–4.0)
MCH: 30.2 pg (ref 26.0–34.0)
MCHC: 32.1 g/dL (ref 30.0–36.0)
MCV: 94 fL (ref 80.0–100.0)
Monocytes Absolute: 0.6 10*3/uL (ref 0.1–1.0)
Monocytes Relative: 8 %
Neutro Abs: 4.1 10*3/uL (ref 1.7–7.7)
Neutrophils Relative %: 54 %
Platelets: 715 10*3/uL — ABNORMAL HIGH (ref 150–400)
RBC: 3.84 MIL/uL — ABNORMAL LOW (ref 3.87–5.11)
RDW: 13.4 % (ref 11.5–15.5)
WBC: 7.6 10*3/uL (ref 4.0–10.5)
nRBC: 0 % (ref 0.0–0.2)

## 2021-10-26 LAB — URINALYSIS, ROUTINE W REFLEX MICROSCOPIC
Bacteria, UA: NONE SEEN
Bilirubin Urine: NEGATIVE
Glucose, UA: NEGATIVE mg/dL
Ketones, ur: NEGATIVE mg/dL
Nitrite: NEGATIVE
Protein, ur: NEGATIVE mg/dL
Specific Gravity, Urine: 1.018 (ref 1.005–1.030)
pH: 7 (ref 5.0–8.0)

## 2021-10-26 LAB — CBG MONITORING, ED: Glucose-Capillary: 73 mg/dL (ref 70–99)

## 2021-10-26 LAB — COMPREHENSIVE METABOLIC PANEL
ALT: 16 U/L (ref 0–44)
AST: 17 U/L (ref 15–41)
Albumin: 3.9 g/dL (ref 3.5–5.0)
Alkaline Phosphatase: 61 U/L (ref 38–126)
Anion gap: 4 — ABNORMAL LOW (ref 5–15)
BUN: 10 mg/dL (ref 6–20)
CO2: 26 mmol/L (ref 22–32)
Calcium: 9 mg/dL (ref 8.9–10.3)
Chloride: 107 mmol/L (ref 98–111)
Creatinine, Ser: 0.55 mg/dL (ref 0.44–1.00)
GFR, Estimated: >60 mL/min (ref >60)
Glucose, Bld: 86 mg/dL (ref 70–99)
Potassium: 3.4 mmol/L — ABNORMAL LOW (ref 3.5–5.1)
Sodium: 137 mmol/L (ref 135–145)
Total Bilirubin: 0.7 mg/dL (ref 0.3–1.2)
Total Protein: 7.7 g/dL (ref 6.5–8.1)

## 2021-10-26 LAB — WET PREP, GENITAL
Sperm: NONE SEEN
Trich, Wet Prep: NONE SEEN
WBC, Wet Prep HPF POC: >=10 — AB (ref <10)
Yeast Wet Prep HPF POC: NONE SEEN

## 2021-10-26 LAB — I-STAT BETA HCG BLOOD, ED (MC, WL, AP ONLY): I-stat hCG, quantitative: <5 m[IU]/mL (ref <5)

## 2021-10-26 MED ORDER — METOCLOPRAMIDE HCL 5 MG/ML IJ SOLN
10.0000 mg | Freq: Once | INTRAMUSCULAR | Status: AC
  Administered 2021-10-26: 10 mg via INTRAVENOUS
  Filled 2021-10-26: qty 2

## 2021-10-26 MED ORDER — DIPHENHYDRAMINE HCL 50 MG/ML IJ SOLN
25.0000 mg | Freq: Once | INTRAMUSCULAR | Status: AC
  Administered 2021-10-26: 25 mg via INTRAVENOUS
  Filled 2021-10-26: qty 1

## 2021-10-26 MED ORDER — LACTATED RINGERS IV BOLUS
1000.0000 mL | Freq: Once | INTRAVENOUS | Status: AC
  Administered 2021-10-26: 1000 mL via INTRAVENOUS

## 2021-10-26 MED ORDER — KETOROLAC TROMETHAMINE 15 MG/ML IJ SOLN
15.0000 mg | Freq: Once | INTRAMUSCULAR | Status: AC
  Administered 2021-10-26: 15 mg via INTRAVENOUS
  Filled 2021-10-26: qty 1

## 2021-10-26 MED ORDER — METRONIDAZOLE 500 MG PO TABS: 500.0000 mg | ORAL_TABLET | Freq: Two times a day (BID) | ORAL | 0 refills | Status: DC

## 2021-10-26 MED ORDER — BUTALBITAL-APAP-CAFFEINE 50-325-40 MG PO TABS: 1.0000 | ORAL_TABLET | Freq: Four times a day (QID) | ORAL | 0 refills | Status: AC | PRN

## 2021-10-26 NOTE — ED Notes (Signed)
Attempted to test pt for COVID. Pt refused COVID test. No reason specified. States "it's my right"

## 2021-10-26 NOTE — ED Notes (Signed)
Pt refused COVID test 

## 2021-10-26 NOTE — ED Provider Notes (Signed)
[facility] DEPT Provider Note   CSN: [id number] Arrival date & time: 10/26/21  0935     History  Chief Complaint  Patient presents with   Headache   Eye Problem   left side pain    Jill Clarke is a 36 y.o. female.  HPI 36 year old female presents with left-sided headache.  She has been having a headache ever since she developed a venous sinus thrombosis last year.  However since 9/29 the headache has been worse and she had other symptoms.  She gets intermittent bilateral blurry vision.  Sometimes double vision at night.  She also gets lightheaded when she stands and fatigued.  Her entire left side, arm and leg, hurt and she states they are weak but not numb.  No fevers.  No urinary symptoms, cough, shortness of breath.  Headache is rated as severe.  She has not taken anything for the headache. She has pain behind both eyes.  Patient notes that she also is not taking the Eliquis when asked.  She states she cannot afford it and has not taken it for months.  Home Medications Prior to Admission medications   Medication Sig Start Date End Date Taking? Authorizing Provider  butalbital-acetaminophen-caffeine (FIORICET) 50-325-40 MG tablet Take 1 tablet by mouth every 6 (six) hours as needed for headache. 10/26/21 10/26/22 Yes Sherwood Gambler, MD  metroNIDAZOLE (FLAGYL) 500 MG tablet Take 1 tablet (500 mg total) by mouth 2 (two) times daily. One po bid x 7 days 10/26/21  Yes Sherwood Gambler, MD  apixaban (ELIQUIS) 5 MG TABS tablet Take 1 tablet (5 mg total) by mouth 2 (two) times daily. Patient not taking: Reported on 07/25/2021 03/17/21   Benay Pike, MD  aspirin EC 81 MG tablet Take 1 tablet (81 mg total) by mouth daily. Swallow whole. Patient not taking: Reported on 07/25/2021 02/07/21   Frann Rider, NP  dicyclomine (BENTYL) 20 MG tablet Take 1 tablet (20 mg total) by mouth 2 (two) times daily. Patient not taking: Reported on 07/25/2021 05/03/21   Tedd Sias, PA  docusate sodium (COLACE) 100 MG capsule Take 1 capsule (100 mg total) by mouth 2 (two) times daily. Patient not taking: Reported on 07/25/2021 03/09/21   Benay Pike, MD  ferrous sulfate 325 (65 FE) MG EC tablet Take 1 tablet (325 mg total) by mouth in the morning and at bedtime. Patient not taking: Reported on 07/25/2021 03/09/21   Benay Pike, MD  ondansetron (ZOFRAN-ODT) 4 MG disintegrating tablet Take 1 tablet (4 mg total) by mouth every 8 (eight) hours as needed for nausea or vomiting. Patient not taking: Reported on 07/25/2021 05/03/21   Tedd Sias, PA  Vitamin D, Ergocalciferol, (DRISDOL) 1.25 MG (50000 UNIT) CAPS capsule Take 1 capsule (50,000 Units total) by mouth every 7 (seven) days. 07/27/21   Alvira Monday, [place]      Allergies    Patient has no known allergies.    Review of Systems   Review of Systems  Constitutional:  Negative for fever.  Eyes:  Positive for visual disturbance.  Respiratory:  Negative for shortness of breath.   Gastrointestinal:  Positive for nausea and vomiting.  Genitourinary:  Negative for dysuria.  Neurological:  Positive for weakness, light-headedness and headaches. Negative for numbness.    Physical Exam Updated Vital Signs BP 112/65 (BP Location: Left Arm)   Pulse 92   Temp 98.4 F (36.9 C) (Oral)   Resp 18   Ht 5' (1.524 m)  Wt 73.9 kg   SpO2 100%   BMI 31.83 kg/m  Physical Exam Vitals and nursing note reviewed. Exam conducted with a chaperone present.  Constitutional:      General: She is not in acute distress.    Appearance: She is well-developed. She is not ill-appearing or diaphoretic.  HENT:     Head: Normocephalic and atraumatic.  Eyes:     Extraocular Movements: Extraocular movements intact.     Pupils: Pupils are equal, round, and reactive to light.  Cardiovascular:     Rate and Rhythm: Normal rate and regular rhythm.     Heart sounds: Normal heart sounds.  Pulmonary:     Effort: Pulmonary effort is  normal.  Abdominal:     Palpations: Abdomen is soft.     Tenderness: There is no abdominal tenderness.  Genitourinary:    Cervix: Discharge (mild) and erythema present. No cervical motion tenderness.     Uterus: Not tender.      Musculoskeletal:     Cervical back: No rigidity.  Skin:    General: Skin is warm and dry.  Neurological:     Mental Status: She is alert.     Comments: CN 3-12 grossly intact. 5/5 strength in all 4 extremities. Grossly normal sensation. Normal finger to nose.      ED Results / Procedures / Treatments   Labs (all labs ordered are listed, but only abnormal results are displayed) Labs Reviewed  WET PREP, GENITAL - Abnormal; Notable for the following components:      Result Value   Clue Cells Wet Prep HPF POC PRESENT (*)    WBC, Wet Prep HPF POC >=10 (*)    All other components within normal limits  COMPREHENSIVE METABOLIC PANEL - Abnormal; Notable for the following components:   Potassium 3.4 (*)    Anion gap 4 (*)    All other components within normal limits  CBC WITH DIFFERENTIAL/PLATELET - Abnormal; Notable for the following components:   RBC 3.84 (*)    Hemoglobin 11.6 (*)    Platelets 715 (*)    All other components within normal limits  URINALYSIS, ROUTINE W REFLEX MICROSCOPIC - Abnormal; Notable for the following components:   Hgb urine dipstick MODERATE (*)    Leukocytes,Ua SMALL (*)    All other components within normal limits  RESP PANEL BY RT-PCR (FLU A&B, COVID) ARPGX2  CBG MONITORING, ED  I-STAT BETA HCG BLOOD, ED ([facility], [facility], [facility] ONLY)  GC/CHLAMYDIA PROBE AMP ([place]) NOT AT [facility]    EKG EKG Interpretation  Date/Time:  Wednesday October 26 2021 11:47:58 EDT Ventricular Rate:  76 PR Interval:  176 QRS Duration: 76 QT Interval:  383 QTC Calculation: 431 R Axis:   66 Text Interpretation: Sinus rhythm Baseline wander in lead(s) I III no acute ST/T changes Confirmed by Sherwood Gambler [phone]) on 10/26/2021 11:50:30  AM  Radiology MR BRAIN WO CONTRAST  Result Date: 10/26/2021 CLINICAL DATA:  Headache and visual disturbance. History of dural venous sinus thrombosis. EXAM: MRI HEAD WITHOUT CONTRAST MRV HEAD WITHOUT CONTRAST TECHNIQUE: Multiplanar, multi-echo pulse sequences of the brain and surrounding structures were acquired without intravenous contrast. Angiographic images of the intracranial venous structures were acquired using MRV technique without intravenous contrast. COMPARISON:  Head CT 10/26/2021.  Head MRI and MRV 11/06/2020. FINDINGS: MRI HEAD WITHOUT CONTRAST Brain: There is no evidence of an acute infarct, mass, midline shift, or extra-axial fluid collection. Hemosiderin stained encephalomalacia in the left parietal lobe corresponds to a  remote hemorrhagic venous infarct. Multiple small chronic watershed infarcts involving the frontal white matter bilaterally are also unchanged from the prior MRI, as is chronic hemorrhage in the splenium of the corpus callosum. The ventricles are normal in size. Vascular: Major intracranial arterial flow voids are preserved. Skull and upper cervical spine: Unremarkable bone marrow signal. Sinuses/Orbits: Unremarkable orbits. Paranasal sinuses and mastoid air cells are clear. Other: None. MR VENOGRAM WITHOUT CONTRAST Residual filling defects compatible with chronic nonocclusive thrombus in the superior sagittal sinus and right greater than left transverse sinuses are similar to the prior MR V. the right sigmoid sinus and right jugular bulb remain patent but diminutive in caliber, also unchanged. No definite new venous sinus thrombosis is identified. IMPRESSION: 1. No acute intracranial abnormality. 2. 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 Electronically Signed   By: Logan Bores M.D.   On: 10/26/2021 13:29   MR Venogram Head  Result Date: 10/26/2021 CLINICAL  DATA:  Headache and visual disturbance. History of dural venous sinus thrombosis. EXAM: MRI HEAD WITHOUT CONTRAST MRV HEAD WITHOUT CONTRAST TECHNIQUE: Multiplanar, multi-echo pulse sequences of the brain and surrounding structures were acquired without intravenous contrast. Angiographic images of the intracranial venous structures were acquired using MRV technique without intravenous contrast. COMPARISON:  Head CT 10/26/2021.  Head MRI and MRV 11/06/2020. FINDINGS: MRI HEAD WITHOUT CONTRAST Brain: There is no evidence of an acute infarct, mass, midline shift, or extra-axial fluid collection. Hemosiderin stained encephalomalacia in the left parietal lobe corresponds to a remote hemorrhagic venous infarct. Multiple small chronic watershed infarcts involving the frontal white matter bilaterally are also unchanged from the prior MRI, as is chronic hemorrhage in the splenium of the corpus callosum. The ventricles are normal in size. Vascular: Major intracranial arterial flow voids are preserved. Skull and upper cervical spine: Unremarkable bone marrow signal. Sinuses/Orbits: Unremarkable orbits. Paranasal sinuses and mastoid air cells are clear. Other: None. MR VENOGRAM WITHOUT CONTRAST Residual filling defects compatible with chronic nonocclusive thrombus in the superior sagittal sinus and right greater than left transverse sinuses are similar to the prior MR V. the right sigmoid sinus and right jugular bulb remain patent but diminutive in caliber, also unchanged. No definite new venous sinus thrombosis is identified. IMPRESSION: 1. No acute intracranial abnormality. 2. 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 Electronically Signed   By: Logan Bores M.D.   On: 10/26/2021 13:29   CT Head Wo Contrast  Result Date: 10/26/2021 CLINICAL DATA:  Headache, new or worsening. Left-sided visual disturbances. EXAM: CT  HEAD WITHOUT CONTRAST TECHNIQUE: Contiguous axial images were obtained from the base of the skull through the vertex without intravenous contrast. RADIATION DOSE REDUCTION: This exam was performed according to the departmental dose-optimization program which includes automated exposure control, adjustment of the mA and/or kV according to patient size and/or use of iterative reconstruction technique. COMPARISON:  CT head without contrast 02/16/2020. MR head without contrast 11/06/2020 FINDINGS: Brain: Remote encephalomalacia of the left frontal parietal lobe is stable. Remote subcortical infarcts are present the anterior frontal lobes bilaterally. No acute infarct, hemorrhage, or mass lesion is present. Basal ganglia are within normal limits. The ventricles are of normal size. No significant extraaxial fluid collection is present. The brainstem and cerebellum are within normal limits. Vascular: No hyperdense vessel or unexpected calcification. Skull: Calvarium is intact. No focal lytic or blastic  lesions are present. No significant extracranial soft tissue lesion is present. Sinuses/Orbits: The paranasal sinuses and mastoid air cells are clear. The globes and orbits are within normal limits. IMPRESSION: 1. Remote encephalomalacia of the left frontal parietal lobe. 2. Remote subcortical infarcts of the anterior frontal lobes bilaterally. 3. No acute intracranial abnormality or significant interval change. Given the patient's previous infarcts, MR head without contrast may be useful for further evaluation. Electronically Signed   By: San Morelle M.D.   On: 10/26/2021 11:21    Procedures Procedures    Medications Ordered in ED Medications  lactated ringers bolus 1,000 mL (0 mLs Intravenous Stopped 10/26/21 1400)  metoCLOPramide (REGLAN) injection 10 mg (10 mg Intravenous Given 10/26/21 1153)  diphenhydrAMINE (BENADRYL) injection 25 mg (25 mg Intravenous Given 10/26/21 1152)  ketorolac (TORADOL) 15 MG/ML  injection 15 mg (15 mg Intravenous Given 10/26/21 1400)    ED Course/ Medical Decision Making/ A&P                           Medical Decision Making Amount and/or Complexity of Data Reviewed External Data Reviewed: notes. Labs: ordered.    Details: Normal WBC.  No hyperglycemia. Radiology: ordered and independent interpretation performed.    Details: No head bleed on CT head.  No acute stroke on MRI ECG/medicine tests: ordered and independent interpretation performed.    Details: No acute ischemia or arrhythmia.  Risk Prescription drug management.   Patient was treated for her headache with Reglan, Benadryl, fluids, and later Toradol.  CT head is unremarkable for acute process such as head bleed.  Labs viewed/interpreted by myself and she has no leukocytosis, significant electrolyte disturbance, and she is not pregnant.  MRI was obtained based on her history but fortunately there is no stroke or venous sinus thrombosis.  She is doing better from a headache perspective.  She denies specific dysuria but states she has been having some vaginal discharge.  Pelvic exam was performed and there is a small amount of discharge and she reports a significant odor so she will be put on Flagyl for what appears to be bacterial vaginitis.  She has pretty low concern for STIs so we will hold on Rocephin or doxycycline for now and send GC/chlamydia testing.  Otherwise, I doubt meningitis/encephalitis, and do not think LP is warranted at this time.  She appears stable for discharge and is instructed to follow-up with her neurologist. Given return precautions.        Final Clinical Impression(s) / ED Diagnoses Final diagnoses:  Left-sided headache  Bacterial vaginitis    Rx / DC Orders ED Discharge Orders          Ordered    butalbital-acetaminophen-caffeine (FIORICET) 50-325-40 MG tablet  Every 6 hours PRN        10/26/21 1540    metroNIDAZOLE (FLAGYL) 500 MG tablet  2 times daily         10/26/21 1540              Sherwood Gambler, MD 10/26/21 1625

## 2021-10-26 NOTE — Discharge Instructions (Signed)
If you develop continued, recurrent, or worsening headache, fever, neck stiffness, vomiting, blurry or double vision, weakness or numbness in your arms or legs, trouble speaking, or any other new/concerning symptoms then return to the ER for evaluation.  

## 2021-10-26 NOTE — ED Triage Notes (Addendum)
Pt has head pain with visual disturbances and left side pain. She was hospitalized for similar symptoms last year, symptoms have become worse since last Friday.

## 2021-10-27 ENCOUNTER — Telehealth: Payer: Self-pay

## 2021-10-27 LAB — GC/CHLAMYDIA PROBE AMP (~~LOC~~) NOT AT ARMC
Chlamydia: NEGATIVE
Comment: NEGATIVE
Comment: NORMAL
Neisseria Gonorrhea: NEGATIVE

## 2021-10-27 NOTE — Telephone Encounter (Signed)
Transition Care Management Follow-up Telephone Call Date of discharge and from where: 10/26/2021 [facility] How have you been since you were released from the hospital? Reports headaches still going on Any questions or concerns? No  Items Reviewed: Did the pt receive and understand the discharge instructions provided? Yes  Medications obtained and verified? Yes  Other?  N/a Any new allergies since your discharge? No  Dietary orders reviewed? No Do you have support at home? Yes   Home Care and Equipment/Supplies: Were home health services ordered? not applicable If so, what is the name of the agency? N/a  Has the agency set up a time to come to the patient's home? not applicable Were any new equipment or medical supplies ordered?  No What is the name of the medical supply agency? N/a  Were you able to get the supplies/equipment? not applicable Do you have any questions related to the use of the equipment or supplies? No  Functional Questionnaire: (I = Independent and D = Dependent) ADLs: i  Bathing/Dressing- i  Meal Prep- i  Eating- i  Maintaining continence- i  Transferring/Ambulation- i  Managing Meds- i  Follow up appointments reviewed:  PCP Hospital f/u appt confirmed? Yes  Scheduled to see Peter Congo on 11/04/21 @ 10:40. [facility] f/u appt confirmed?  N/a  Scheduled to see  on. N/a Are transportation arrangements needed? No  If their condition worsens, is the pt aware to call PCP or go to the Emergency Dept.? Yes Was the patient provided with contact information for the PCP's office or ED? Yes Was to pt encouraged to call back with questions or concerns? Yes

## 2021-11-04 ENCOUNTER — Encounter: Payer: Self-pay | Admitting: Family Medicine

## 2021-11-04 ENCOUNTER — Ambulatory Visit: Payer: Medicaid Other | Admitting: Family Medicine

## 2021-11-04 DIAGNOSIS — R519 Headache, unspecified: Secondary | ICD-10-CM | POA: Insufficient documentation

## 2021-11-04 NOTE — Progress Notes (Signed)
f °

## 2021-11-04 NOTE — Patient Instructions (Signed)
I appreciate the opportunity to provide care to you today!    Follow up:  11/15/21  Please pick up your Fioricet 50-325-40 at the pharmacy and start taking for headaches Please inform if your symptoms worsen or is unrelieved with the medication Please stay hydrated, consume at least 64 ounces of water daily   Please continue to a heart-healthy diet and increase your physical activities. Try to exercise for 89mns at least three times a week.      It was a pleasure to see you and I look forward to continuing to work together on your health and well-being. Please do not hesitate to call the office if you need care or have questions about your care.   Have a wonderful day and week. With Gratitude, GAlvira MondayMSN, FNP-BC

## 2021-11-04 NOTE — Progress Notes (Signed)
Established Patient Office Visit  Subjective:  Patient ID: Jill Clarke, female    DOB: January 14, 1986  Age: 36 y.o. MRN: [id number]  CC:  Chief Complaint  Patient presents with   Follow-up    Er follow up, reports headaches at times with nausea.     HPI Jill Clarke is a 36 y.o. female with past medical history of asthma, hypertension, respiratory failure, and seizures presents for f/u of  chronic medical conditions.  Left-sided headache: She was seen in the ED on 10/26/2021 for worsening of her headaches since 10/21/2021.  She was noted to having blurry and double vision bilaterally.  Imaging studies were unremarkable with no evidence of intracranial bleed, stroke, and venous sinus thrombosis.  She was prescribed Fioricet to take every 6 hours as needed for headache.  She reports that she has not picked up her medication.  She reports having occasional headaches with nausea.  She rates headache 7 out of 10.  Pain is described as aching and throbbing.  Past Medical History:  Diagnosis Date   Asthma    uses inhaler prn   Diabetes mellitus without complication ([place])    Gestational diabetes   Sickle cell trait ([place])    Stroke ([place])    Unspecified high-risk pregnancy 01/27/2011    Past Surgical History:  Procedure Laterality Date   IR ANGIO INTRA EXTRACRAN SEL COM CAROTID INNOMINATE UNI R MOD SED  02/13/2020   IR ANGIO INTRA EXTRACRAN SEL COM CAROTID INNOMINATE UNI R MOD SED  02/15/2020   IR ANGIO INTRA EXTRACRAN SEL INTERNAL CAROTID UNI L MOD SED  02/13/2020   IR ANGIO VERTEBRAL SEL VERTEBRAL UNI R MOD SED  02/13/2020   IR CT HEAD LTD  02/13/2020   IR CT HEAD LTD  02/15/2020   IR PTA VENOUS EXCEPT DIALYSIS CIRCUIT  02/15/2020   IR THROMBECT VENO MECH MOD SED  02/13/2020   IR THROMBECT VENO MECH REPT MOD SED  02/15/2020   RADIOLOGY WITH ANESTHESIA N/A 02/13/2020   Procedure: IR WITH ANESTHESIA;  Surgeon: Luanne Bras, MD;  Location: [facility];  Service: Radiology;  Laterality:  N/A;   RADIOLOGY WITH ANESTHESIA N/A 02/15/2020   Procedure: IR WITH ANESTHESIA;  Surgeon: Radiologist, Medication, MD;  Location: [facility];  Service: Radiology;  Laterality: N/A;   TONSILLECTOMY     WISDOM TOOTH EXTRACTION      Family History  Problem Relation Age of Onset   Sickle cell trait Mother    Depression Mother    Asthma Father    Drug abuse Father        heroin abuse   Kidney disease Father        due to heroin abuse   Diabetes Maternal Grandmother    Sleep apnea Neg Hx    Snoring Neg Hx     Social History   Socioeconomic History   Marital status: Single    Spouse name: Not on file   Number of children: 2   Years of education: Not on file   Highest education level: Not on file  Occupational History   Not on file  Tobacco Use   Smoking status: Former   Smokeless tobacco: Never  Substance and Sexual Activity   Alcohol use: No   Drug use: No   Sexual activity: Yes    Birth control/protection: None, Condom, Abstinence    Comment: h/o implant, depo  Other Topics Concern   Not on file  Social History Narrative   Lives  with mother and her 2 kids   Left Handed   None    Social Determinants of Health   Financial Resource Strain: Not on file  Food Insecurity: Not on file  Transportation Needs: Not on file  Physical Activity: Not on file  Stress: Not on file  Social Connections: Not on file  Intimate Partner Violence: Not on file    Outpatient Medications Prior to Visit  Medication Sig Dispense Refill   apixaban (ELIQUIS) 5 MG TABS tablet Take 1 tablet (5 mg total) by mouth 2 (two) times daily. (Patient not taking: Reported on 11/04/2021) 60 tablet 2   aspirin EC 81 MG tablet Take 1 tablet (81 mg total) by mouth daily. Swallow whole. (Patient not taking: Reported on 11/04/2021) 30 tablet 11   butalbital-acetaminophen-caffeine (FIORICET) 50-325-40 MG tablet Take 1 tablet by mouth every 6 (six) hours as needed for headache. (Patient not taking: Reported on  11/04/2021) 20 tablet 0   dicyclomine (BENTYL) 20 MG tablet Take 1 tablet (20 mg total) by mouth 2 (two) times daily. (Patient not taking: Reported on 11/04/2021) 20 tablet 0   docusate sodium (COLACE) 100 MG capsule Take 1 capsule (100 mg total) by mouth 2 (two) times daily. (Patient not taking: Reported on 11/04/2021) 10 capsule 0   ferrous sulfate 325 (65 FE) MG EC tablet Take 1 tablet (325 mg total) by mouth in the morning and at bedtime. (Patient not taking: Reported on 11/04/2021) 60 tablet 3   metroNIDAZOLE (FLAGYL) 500 MG tablet Take 1 tablet (500 mg total) by mouth 2 (two) times daily. One po bid x 7 days (Patient not taking: Reported on 11/04/2021) 14 tablet 0   ondansetron (ZOFRAN-ODT) 4 MG disintegrating tablet Take 1 tablet (4 mg total) by mouth every 8 (eight) hours as needed for nausea or vomiting. (Patient not taking: Reported on 11/04/2021) 20 tablet 0   Vitamin D, Ergocalciferol, (DRISDOL) 1.25 MG (50000 UNIT) CAPS capsule Take 1 capsule (50,000 Units total) by mouth every 7 (seven) days. (Patient not taking: Reported on 11/04/2021) 5 capsule 1   No facility-administered medications prior to visit.    No Known Allergies  ROS Review of Systems  Constitutional:  Negative for fatigue and fever.  Respiratory:  Negative for chest tightness and shortness of breath.   Cardiovascular:  Negative for chest pain and palpitations.  Neurological:  Positive for headaches. Negative for dizziness.      Objective:    Physical Exam HENT:     Head: Normocephalic.  Cardiovascular:     Rate and Rhythm: Normal rate and regular rhythm.     Pulses: Normal pulses.     Heart sounds: Normal heart sounds.  Pulmonary:     Effort: Pulmonary effort is normal.     Breath sounds: Normal breath sounds.  Neurological:     Mental Status: She is alert.     BP 118/72   Pulse 98   Ht 5' (1.524 m)   Wt 163 lb 0.6 oz (74 kg)   SpO2 94%   BMI 31.84 kg/m  Wt Readings from Last 3 Encounters:   11/04/21 163 lb 0.6 oz (74 kg)  10/26/21 163 lb (73.9 kg)  08/15/21 158 lb 3.2 oz (71.8 kg)    Lab Results  Component Value Date   TSH 2.330 07/25/2021   Lab Results  Component Value Date   WBC 7.6 10/26/2021   HGB 11.6 (L) 10/26/2021   HCT 36.1 10/26/2021   MCV 94.0 10/26/2021  PLT 715 (H) 10/26/2021   Lab Results  Component Value Date   NA 137 10/26/2021   K 3.4 (L) 10/26/2021   CO2 26 10/26/2021   GLUCOSE 86 10/26/2021   BUN 10 10/26/2021   CREATININE 0.55 10/26/2021   BILITOT 0.7 10/26/2021   ALKPHOS 61 10/26/2021   AST 17 10/26/2021   ALT 16 10/26/2021   PROT 7.7 10/26/2021   ALBUMIN 3.9 10/26/2021   CALCIUM 9.0 10/26/2021   ANIONGAP 4 (L) 10/26/2021   EGFR 126 07/25/2021   Lab Results  Component Value Date   CHOL 162 07/25/2021   Lab Results  Component Value Date   HDL 57 07/25/2021   Lab Results  Component Value Date   LDLCALC 94 07/25/2021   Lab Results  Component Value Date   TRIG 52 07/25/2021   Lab Results  Component Value Date   CHOLHDL 2.8 07/25/2021   Lab Results  Component Value Date   HGBA1C 5.0 07/25/2021      Assessment & Plan:   Problem List Items Addressed This Visit       Other   Left-sided headache    Encouraged to pick up prescription, fiorect and take every 6 hours as needed for headaches Encouraged to inform me if her symptoms worsen or are unrelieved by the medication We will consider referral to neurology as indicated for further evaluation of persistent intractable headache       No orders of the defined types were placed in this encounter.   Follow-up: Return in 6 weeks (on 12/16/2021).    Alvira Monday, FNP

## 2021-11-04 NOTE — Assessment & Plan Note (Signed)
Encouraged to pick up prescription, fiorect and take every 6 hours as needed for headaches Encouraged to inform me if her symptoms worsen or are unrelieved by the medication We will consider referral to neurology as indicated for further evaluation of persistent intractable headache

## 2021-11-08 ENCOUNTER — Inpatient Hospital Stay: Payer: Medicaid Other | Admitting: Family Medicine

## 2021-11-15 ENCOUNTER — Encounter: Payer: Medicaid Other | Admitting: Family Medicine

## 2021-11-17 ENCOUNTER — Encounter: Payer: Self-pay | Admitting: Family Medicine

## 2021-11-18 ENCOUNTER — Ambulatory Visit (INDEPENDENT_AMBULATORY_CARE_PROVIDER_SITE_OTHER): Payer: Medicaid Other | Admitting: Primary Care

## 2021-12-02 ENCOUNTER — Encounter: Payer: Medicaid Other | Admitting: Family Medicine

## 2021-12-30 ENCOUNTER — Encounter: Payer: Medicaid Other | Admitting: Family Medicine

## 2021-12-30 ENCOUNTER — Other Ambulatory Visit: Payer: Self-pay

## 2022-01-17 ENCOUNTER — Encounter: Payer: Medicaid Other | Admitting: Internal Medicine

## 2022-01-19 ENCOUNTER — Encounter: Payer: Self-pay | Admitting: Family Medicine

## 2022-01-19 ENCOUNTER — Ambulatory Visit (INDEPENDENT_AMBULATORY_CARE_PROVIDER_SITE_OTHER): Payer: Medicaid Other | Admitting: Family Medicine

## 2022-01-19 ENCOUNTER — Other Ambulatory Visit (HOSPITAL_COMMUNITY): Payer: Self-pay

## 2022-01-19 VITALS — BP 102/67 | HR 106 | Wt 164.0 lb

## 2022-01-19 DIAGNOSIS — R112 Nausea with vomiting, unspecified: Secondary | ICD-10-CM | POA: Diagnosis not present

## 2022-01-19 DIAGNOSIS — R12 Heartburn: Secondary | ICD-10-CM | POA: Diagnosis not present

## 2022-01-19 DIAGNOSIS — R140 Abdominal distension (gaseous): Secondary | ICD-10-CM

## 2022-01-19 DIAGNOSIS — Z0000 Encounter for general adult medical examination without abnormal findings: Secondary | ICD-10-CM

## 2022-01-19 DIAGNOSIS — R109 Unspecified abdominal pain: Secondary | ICD-10-CM | POA: Diagnosis not present

## 2022-01-19 MED ORDER — PANTOPRAZOLE SODIUM 20 MG PO TBEC
20.0000 mg | DELAYED_RELEASE_TABLET | Freq: Every day | ORAL | 0 refills | Status: DC
  Filled 2022-01-19: qty 30, 30d supply, fill #0

## 2022-01-19 MED ORDER — ONDANSETRON HCL 4 MG PO TABS
4.0000 mg | ORAL_TABLET | Freq: Three times a day (TID) | ORAL | 0 refills | Status: DC | PRN
  Filled 2022-01-19: qty 20, 7d supply, fill #0

## 2022-01-19 MED ORDER — DICYCLOMINE HCL 10 MG PO CAPS
10.0000 mg | ORAL_CAPSULE | Freq: Three times a day (TID) | ORAL | 0 refills | Status: DC
  Filled 2022-01-19: qty 30, 10d supply, fill #0

## 2022-01-19 NOTE — Progress Notes (Signed)
   Acute Office Visit  Subjective:     Patient ID: Jill Clarke, female    DOB: 08/26/1985, 36 y.o.   MRN: [id number]  Chief Complaint  Patient presents with   Annual Exam    HPI Abdominal Pain: Patient complains of abdominal pain and bloating The pain is described as aching, cramping, and sharp, and is 10/10 in intensity. Pain is located in the diffusely with radiation to upper back. Onset was 6 month ago. Symptoms have been gradually worsening since eating. Associated symptoms: constipation, nausea, and vomiting.     Review of Systems  Constitutional:  Negative for chills, diaphoresis, fever, malaise/fatigue and weight loss.  Respiratory:  Negative for shortness of breath.   Cardiovascular:  Negative for chest pain.  Gastrointestinal:  Positive for abdominal pain, heartburn, nausea and vomiting. Negative for blood in stool, constipation, diarrhea and melena.  Skin:  Negative for itching and rash.        Objective:    BP 102/67   Pulse (!) 106   Wt 164 lb (74.4 kg)   SpO2 97%   BMI 32.03 kg/m  BP Readings from Last 3 Encounters:  01/19/22 102/67  11/04/21 118/72  10/26/21 112/65      Physical Exam Abdominal:     General: Bowel sounds are normal. There is no distension.     Palpations: Abdomen is soft. There is no mass.     Tenderness: There is abdominal tenderness. There is no right CVA tenderness, left CVA tenderness, guarding or rebound.     Hernia: No hernia is present.  Psychiatric:        Mood and Affect: Mood normal.     No results found for any visits on 01/19/22.      Assessment & Plan:   Problem List Items Addressed This Visit       Other   Abdominal bloating with cramps - Primary    Abdomen is soft, symmetric and non tender without distention. Active bowel sounds auscultated all four quadrants. Discomfort and tenderness was felt on light palpitation right upper quadrant. Liver and spleen are not enlarged . Patient did not have rebound  tenderness.There are no visible lesions. The aorta is midline without bruit or visible pulsation.       Relevant Medications   dicyclomine (BENTYL) 10 MG capsule   Other Relevant Orders   Ambulatory referral to Gastroenterology   Other Visit Diagnoses     Nausea and vomiting, unspecified vomiting type       Relevant Medications   ondansetron (ZOFRAN) 4 MG tablet   Heartburn       Relevant Medications   pantoprazole (PROTONIX) 20 MG tablet       Meds ordered this encounter  Medications   pantoprazole (PROTONIX) 20 MG tablet    Sig: Take 1 tablet (20 mg total) by mouth daily.    Dispense:  30 tablet    Refill:  0   dicyclomine (BENTYL) 10 MG capsule    Sig: Take 1 capsule (10 mg total) by mouth 3 (three) times daily before meals.    Dispense:  30 capsule    Refill:  0   ondansetron (ZOFRAN) 4 MG tablet    Sig: Take 1 tablet (4 mg total) by mouth every 8 (eight) hours as needed for nausea or vomiting.    Dispense:  20 tablet    Refill:  0    No follow-ups on file.  Renard Hamper Ria Comment, FNP

## 2022-01-19 NOTE — Patient Instructions (Addendum)
Please take medications as ordered Please Attend to your GI Referral  If symptoms get worse contact Primary Care Provider or visit the ER Follow up for your Annual exam and Breathing issues in 2 weeks

## 2022-01-20 ENCOUNTER — Other Ambulatory Visit (HOSPITAL_COMMUNITY): Payer: Self-pay

## 2022-01-21 NOTE — Assessment & Plan Note (Addendum)
Abdomen is soft, symmetric and non tender without distention. Active bowel sounds auscultated all four quadrants. Discomfort and tenderness was felt on light palpitation right upper quadrant. Liver and spleen are not enlarged . Patient did not have rebound tenderness.There are no visible lesions. The aorta is midline without bruit or visible pulsation.

## 2022-01-24 ENCOUNTER — Other Ambulatory Visit (HOSPITAL_COMMUNITY): Payer: Self-pay

## 2022-02-02 ENCOUNTER — Encounter: Payer: Medicaid Other | Admitting: Family Medicine

## 2022-02-02 ENCOUNTER — Encounter: Payer: Self-pay | Admitting: Family Medicine

## 2022-02-08 ENCOUNTER — Encounter: Payer: Medicaid Other | Admitting: Family Medicine

## 2022-02-08 ENCOUNTER — Other Ambulatory Visit (HOSPITAL_COMMUNITY): Payer: Self-pay

## 2022-02-17 ENCOUNTER — Ambulatory Visit (INDEPENDENT_AMBULATORY_CARE_PROVIDER_SITE_OTHER): Payer: Medicaid Other | Admitting: Primary Care

## 2022-03-06 ENCOUNTER — Other Ambulatory Visit (HOSPITAL_COMMUNITY): Payer: Self-pay

## 2022-03-06 ENCOUNTER — Other Ambulatory Visit: Payer: Self-pay

## 2022-03-06 ENCOUNTER — Ambulatory Visit (INDEPENDENT_AMBULATORY_CARE_PROVIDER_SITE_OTHER): Payer: Medicaid Other | Admitting: Family Medicine

## 2022-03-06 ENCOUNTER — Encounter: Payer: Self-pay | Admitting: Family Medicine

## 2022-03-06 ENCOUNTER — Telehealth: Payer: Self-pay | Admitting: Family Medicine

## 2022-03-06 VITALS — BP 122/84 | HR 99 | Ht 62.0 in | Wt 165.0 lb

## 2022-03-06 DIAGNOSIS — Z411 Encounter for cosmetic surgery: Secondary | ICD-10-CM

## 2022-03-06 DIAGNOSIS — E039 Hypothyroidism, unspecified: Secondary | ICD-10-CM | POA: Diagnosis not present

## 2022-03-06 DIAGNOSIS — R12 Heartburn: Secondary | ICD-10-CM

## 2022-03-06 DIAGNOSIS — R7301 Impaired fasting glucose: Secondary | ICD-10-CM

## 2022-03-06 DIAGNOSIS — R140 Abdominal distension (gaseous): Secondary | ICD-10-CM | POA: Diagnosis not present

## 2022-03-06 DIAGNOSIS — E785 Hyperlipidemia, unspecified: Secondary | ICD-10-CM

## 2022-03-06 DIAGNOSIS — Z0001 Encounter for general adult medical examination with abnormal findings: Secondary | ICD-10-CM | POA: Diagnosis not present

## 2022-03-06 DIAGNOSIS — R0602 Shortness of breath: Secondary | ICD-10-CM | POA: Diagnosis not present

## 2022-03-06 DIAGNOSIS — R109 Unspecified abdominal pain: Secondary | ICD-10-CM

## 2022-03-06 DIAGNOSIS — Z1159 Encounter for screening for other viral diseases: Secondary | ICD-10-CM

## 2022-03-06 MED ORDER — DICYCLOMINE HCL 10 MG PO CAPS
10.0000 mg | ORAL_CAPSULE | Freq: Three times a day (TID) | ORAL | 0 refills | Status: DC
  Filled 2022-03-06 – 2022-05-17 (×2): qty 30, 10d supply, fill #0

## 2022-03-06 MED ORDER — PANTOPRAZOLE SODIUM 20 MG PO TBEC
20.0000 mg | DELAYED_RELEASE_TABLET | Freq: Every day | ORAL | 0 refills | Status: DC
  Filled 2022-03-06 – 2022-05-17 (×2): qty 30, 30d supply, fill #0

## 2022-03-06 MED ORDER — ALBUTEROL SULFATE HFA 108 (90 BASE) MCG/ACT IN AERS
2.0000 | INHALATION_SPRAY | Freq: Four times a day (QID) | RESPIRATORY_TRACT | 2 refills | Status: AC | PRN
  Filled 2022-03-06 – 2022-05-17 (×2): qty 18, 25d supply, fill #0

## 2022-03-06 NOTE — Progress Notes (Unsigned)
New Patient Office Visit   Subjective   Patient ID: Jill Clarke, female    DOB: 1986-01-16  Age: 37 y.o. MRN: [id number]  CC:  Chief Complaint  Patient presents with   Annual Exam    HPI Jill Clarke presents to establish care. She  has a past medical history of Asthma, Diabetes mellitus without complication ([place]), Sickle cell trait ([place]), Stroke ([place]), and Unspecified high-risk pregnancy (01/27/2011).  HPI   Outpatient Encounter Medications as of 03/06/2022  Medication Sig   apixaban (ELIQUIS) 5 MG TABS tablet Take 1 tablet (5 mg total) by mouth 2 (two) times daily. (Patient not taking: Reported on 11/04/2021)   aspirin EC 81 MG tablet Take 1 tablet (81 mg total) by mouth daily. Swallow whole. (Patient not taking: Reported on 11/04/2021)   butalbital-acetaminophen-caffeine (FIORICET) 50-325-40 MG tablet Take 1 tablet by mouth every 6 (six) hours as needed for headache. (Patient not taking: Reported on 11/04/2021)   dicyclomine (BENTYL) 10 MG capsule Take 1 capsule (10 mg total) by mouth 3 (three) times daily before meals. (Patient not taking: Reported on 03/06/2022)   dicyclomine (BENTYL) 20 MG tablet Take 1 tablet (20 mg total) by mouth 2 (two) times daily. (Patient not taking: Reported on 11/04/2021)   docusate sodium (COLACE) 100 MG capsule Take 1 capsule (100 mg total) by mouth 2 (two) times daily. (Patient not taking: Reported on 11/04/2021)   ferrous sulfate 325 (65 FE) MG EC tablet Take 1 tablet (325 mg total) by mouth in the morning and at bedtime. (Patient not taking: Reported on 11/04/2021)   metroNIDAZOLE (FLAGYL) 500 MG tablet Take 1 tablet (500 mg total) by mouth 2 (two) times daily. One po bid x 7 days (Patient not taking: Reported on 11/04/2021)   ondansetron (ZOFRAN) 4 MG tablet Take 1 tablet (4 mg total) by mouth every 8 (eight) hours as needed for nausea or vomiting. (Patient not taking: Reported on 03/06/2022)   ondansetron (ZOFRAN-ODT) 4 MG  disintegrating tablet Take 1 tablet (4 mg total) by mouth every 8 (eight) hours as needed for nausea or vomiting. (Patient not taking: Reported on 11/04/2021)   pantoprazole (PROTONIX) 20 MG tablet Take 1 tablet (20 mg total) by mouth daily.   Vitamin D, Ergocalciferol, (DRISDOL) 1.25 MG (50000 UNIT) CAPS capsule Take 1 capsule (50,000 Units total) by mouth every 7 (seven) days. (Patient not taking: Reported on 11/04/2021)   No facility-administered encounter medications on file as of 03/06/2022.    Past Surgical History:  Procedure Laterality Date   IR ANGIO INTRA EXTRACRAN SEL COM CAROTID INNOMINATE UNI R MOD SED  02/13/2020   IR ANGIO INTRA EXTRACRAN SEL COM CAROTID INNOMINATE UNI R MOD SED  02/15/2020   IR ANGIO INTRA EXTRACRAN SEL INTERNAL CAROTID UNI L MOD SED  02/13/2020   IR ANGIO VERTEBRAL SEL VERTEBRAL UNI R MOD SED  02/13/2020   IR CT HEAD LTD  02/13/2020   IR CT HEAD LTD  02/15/2020   IR PTA VENOUS EXCEPT DIALYSIS CIRCUIT  02/15/2020   IR THROMBECT VENO MECH MOD SED  02/13/2020   IR THROMBECT VENO MECH REPT MOD SED  02/15/2020   RADIOLOGY WITH ANESTHESIA N/A 02/13/2020   Procedure: IR WITH ANESTHESIA;  Surgeon: Luanne Bras, MD;  Location: [facility];  Service: Radiology;  Laterality: N/A;   RADIOLOGY WITH ANESTHESIA N/A 02/15/2020   Procedure: IR WITH ANESTHESIA;  Surgeon: Radiologist, Medication, MD;  Location: [facility];  Service: Radiology;  Laterality: N/A;  TONSILLECTOMY     WISDOM TOOTH EXTRACTION      Review of Systems  Constitutional:  Negative for chills and fever.  HENT:  Negative for tinnitus.   Eyes:  Negative for blurred vision.  Respiratory:  Positive for shortness of breath.   Cardiovascular:  Negative for chest pain.  Gastrointestinal:  Positive for heartburn. Negative for nausea and vomiting.  Genitourinary:  Negative for dysuria.  Musculoskeletal:  Positive for myalgias.  Skin:  Negative for itching.  Neurological:  Negative for dizziness and headaches.       Objective    BP 122/84   Pulse 99   Ht 5' 2"$  (1.575 m)   Wt 165 lb (74.8 kg)   SpO2 97%   BMI 30.18 kg/m   Physical Exam    Assessment & Plan:  Abdominal bloating with cramps  Heartburn    No follow-ups on file.   Renard Hamper Ria Comment, FNP

## 2022-03-06 NOTE — Patient Instructions (Signed)
It was pleasure meeting with you today. Please take medications as prescribed. Follow up with your primary health provider if any health concerns arises. If symptoms worsen please contact your primary care provider and/or visit the emergency department.

## 2022-03-06 NOTE — Telephone Encounter (Signed)
Pt needs a new referral to [facility]  wants [facility]

## 2022-03-06 NOTE — Telephone Encounter (Signed)
Pt asked for referral to be resent to the same place.

## 2022-03-07 ENCOUNTER — Encounter: Payer: Self-pay | Admitting: Family Medicine

## 2022-03-07 DIAGNOSIS — Z0001 Encounter for general adult medical examination with abnormal findings: Secondary | ICD-10-CM | POA: Insufficient documentation

## 2022-03-07 NOTE — Assessment & Plan Note (Signed)
Physical exam done, labs ordered  Updated screening and health maintenance  Exercise and nutrition counseling BMI 30.18 Explained lifestyle modifications follow diet low in saturated fat, reduce dietary salt intake, avoid fatty foods, maintain an exercise routine 3 to 5 days a week for a minimum total of 150 minutes.  Refilled Medications, Albuterol, Dicyclomine, Protonix

## 2022-03-07 NOTE — Progress Notes (Signed)
Complete physical exam  Patient: Jill Clarke   DOB: July 21, 1985   37 y.o. Female  MRN: [id number]  Subjective:    Chief Complaint  Patient presents with   Annual Exam    SAVAHNA FAULCONER is a 37 y.o. female who presents today for a complete physical exam. She reports consuming a general diet. The patient does not participate in regular exercise at present. She generally feels fairly well. She reports sleeping fairly well. She does not have additional problems to discuss today.    Most recent fall risk assessment:    03/06/2022    3:17 PM  [place] in the past year? 0  Number falls in past yr: 0  Injury with Fall? 0  Risk for fall due to : No Fall Risks  Follow up Falls evaluation completed     Most recent depression screenings:    03/06/2022    3:17 PM 01/19/2022    4:28 PM  PHQ 2/9 Scores  PHQ - 2 Score 2 2  PHQ- 9 Score 10 11    Vision:Not within last year   Patient Care Team: Alvira Monday, FNP as PCP - General (Family Medicine)   Outpatient Medications Prior to Visit  Medication Sig   apixaban (ELIQUIS) 5 MG TABS tablet Take 1 tablet (5 mg total) by mouth 2 (two) times daily. (Patient not taking: Reported on 11/04/2021)   aspirin EC 81 MG tablet Take 1 tablet (81 mg total) by mouth daily. Swallow whole. (Patient not taking: Reported on 11/04/2021)   butalbital-acetaminophen-caffeine (FIORICET) 50-325-40 MG tablet Take 1 tablet by mouth every 6 (six) hours as needed for headache. (Patient not taking: Reported on 11/04/2021)   docusate sodium (COLACE) 100 MG capsule Take 1 capsule (100 mg total) by mouth 2 (two) times daily. (Patient not taking: Reported on 11/04/2021)   ferrous sulfate 325 (65 FE) MG EC tablet Take 1 tablet (325 mg total) by mouth in the morning and at bedtime. (Patient not taking: Reported on 11/04/2021)   metroNIDAZOLE (FLAGYL) 500 MG tablet Take 1 tablet (500 mg total) by mouth 2 (two) times daily. One po bid x 7 days (Patient  not taking: Reported on 11/04/2021)   ondansetron (ZOFRAN) 4 MG tablet Take 1 tablet (4 mg total) by mouth every 8 (eight) hours as needed for nausea or vomiting. (Patient not taking: Reported on 03/06/2022)   ondansetron (ZOFRAN-ODT) 4 MG disintegrating tablet Take 1 tablet (4 mg total) by mouth every 8 (eight) hours as needed for nausea or vomiting. (Patient not taking: Reported on 11/04/2021)   Vitamin D, Ergocalciferol, (DRISDOL) 1.25 MG (50000 UNIT) CAPS capsule Take 1 capsule (50,000 Units total) by mouth every 7 (seven) days. (Patient not taking: Reported on 11/04/2021)   [DISCONTINUED] dicyclomine (BENTYL) 10 MG capsule Take 1 capsule (10 mg total) by mouth 3 (three) times daily before meals. (Patient not taking: Reported on 03/06/2022)   [DISCONTINUED] dicyclomine (BENTYL) 20 MG tablet Take 1 tablet (20 mg total) by mouth 2 (two) times daily. (Patient not taking: Reported on 11/04/2021)   [DISCONTINUED] pantoprazole (PROTONIX) 20 MG tablet Take 1 tablet (20 mg total) by mouth daily.   No facility-administered medications prior to visit.    Review of Systems  Constitutional:  Negative for chills and fever.  HENT:  Negative for congestion and sinus pain.   Eyes:  Negative for pain.  Respiratory:  Positive for shortness of breath. Negative for cough.   Cardiovascular:  Negative for  chest pain.  Gastrointestinal:  Positive for abdominal pain and heartburn.  Genitourinary:  Negative for dysuria.  Musculoskeletal:  Negative for back pain and neck pain.  Skin:  Negative for itching.  Neurological:  Negative for dizziness.  Endo/Heme/Allergies:  Negative for polydipsia.  Psychiatric/Behavioral:  Negative for depression.        Objective:    BP 122/84   Pulse 99   Ht 5' 2"$  (1.575 m)   Wt 165 lb (74.8 kg)   SpO2 97%   BMI 30.18 kg/m  BP Readings from Last 3 Encounters:  03/06/22 122/84  01/19/22 102/67  11/04/21 118/72      Physical Exam HENT:     Head: Normocephalic.      Right Ear: Tympanic membrane normal.     Left Ear: Tympanic membrane normal.     Nose: Nose normal. No congestion or rhinorrhea.     Mouth/Throat:     Mouth: Mucous membranes are moist.  Eyes:     Extraocular Movements: Extraocular movements intact.     Pupils: Pupils are equal, round, and reactive to light.  Cardiovascular:     Rate and Rhythm: Normal rate and regular rhythm.     Pulses: Normal pulses.     Heart sounds: Normal heart sounds.  Pulmonary:     Effort: Pulmonary effort is normal.     Breath sounds: Normal breath sounds.  Abdominal:     General: Bowel sounds are normal.     Palpations: Abdomen is soft. There is no mass.     Tenderness: There is no abdominal tenderness. There is no guarding.     Hernia: No hernia is present.  Musculoskeletal:        General: Normal range of motion.     Cervical back: Normal range of motion and neck supple.  Skin:    General: Skin is warm and dry.     Capillary Refill: Capillary refill takes less than 2 seconds.  Neurological:     General: No focal deficit present.     Mental Status: She is alert.     Coordination: Coordination normal.     Gait: Gait normal.  Psychiatric:        Mood and Affect: Mood normal.        Behavior: Behavior normal.      No results found for any visits on 03/06/22.    Assessment & Plan:    Routine Health Maintenance and Physical Exam  Immunization History  Administered Date(s) Administered   Influenza-Unspecified 01/09/2011   PPD Test 08/01/2021   Tdap 07/15/2011    Health Maintenance  Topic Date Due   DTaP/Tdap/Td (2 - Td or Tdap) 07/14/2021   COVID-19 Vaccine (1) 03/22/2022 (Originally 11/10/1989)   INFLUENZA VACCINE  04/23/2022 (Originally 08/23/2021)   PAP SMEAR-Modifier  08/15/2024   Hepatitis C Screening  Completed   HIV Screening  Completed   HPV VACCINES  Aged Out    Discussed health benefits of physical activity, and encouraged her to engage in regular exercise appropriate for her  age and condition.  SOB (shortness of breath) -     Albuterol Sulfate HFA; Inhale 2 puffs into the lungs every 6 (six) hours as needed for wheezing or shortness of breath.  Dispense: 18 g; Refill: 2  Abdominal bloating with cramps -     Dicyclomine HCl; Take 1 capsule (10 mg total) by mouth 3 (three) times daily before meals.  Dispense: 30 capsule; Refill: 0  Heartburn -  Pantoprazole Sodium; Take 1 tablet (20 mg total) by mouth daily.  Dispense: 30 tablet; Refill: 0  Hypothyroidism, unspecified type -     TSH + free T4  Need for hepatitis C screening test -     Hepatitis C antibody  IFG (impaired fasting glucose) -     Hemoglobin A1c  Hyperlipidemia, unspecified hyperlipidemia type -     Lipid panel  Encounter for routine adult physical exam with abnormal findings Assessment & Plan: Physical exam done, labs ordered  Updated screening and health maintenance  Exercise and nutrition counseling BMI 30.18 Explained lifestyle modifications follow diet low in saturated fat, reduce dietary salt intake, avoid fatty foods, maintain an exercise routine 3 to 5 days a week for a minimum total of 150 minutes.  Refilled Medications, Albuterol, Dicyclomine, Protonix     No follow-ups on file.     Renard Hamper Ria Comment, FNP

## 2022-03-14 ENCOUNTER — Encounter: Payer: Medicaid Other | Admitting: Family Medicine

## 2022-03-21 ENCOUNTER — Other Ambulatory Visit (HOSPITAL_COMMUNITY): Payer: Self-pay

## 2022-04-04 ENCOUNTER — Encounter (INDEPENDENT_AMBULATORY_CARE_PROVIDER_SITE_OTHER): Payer: Medicaid Other | Admitting: Primary Care

## 2022-04-04 DIAGNOSIS — Z7689 Persons encountering health services in other specified circumstances: Secondary | ICD-10-CM

## 2022-04-04 NOTE — Progress Notes (Unsigned)
New Patient Office Visit  Subjective    Patient ID: Jill Clarke, female    DOB: [birth year]  Age: 37 y.o. MRN: [id number]  CC:  Chief Complaint  Patient presents with   New Patient (Initial Visit)    HPI Jill Clarke presents to establish care ***  Outpatient Encounter Medications as of 04/04/2022  Medication Sig   albuterol (VENTOLIN HFA) 108 (90 Base) MCG/ACT inhaler Inhale 2 puffs into the lungs every 6 (six) hours as needed for wheezing or shortness of breath.   apixaban (ELIQUIS) 5 MG TABS tablet Take 1 tablet (5 mg total) by mouth 2 (two) times daily. (Patient not taking: Reported on 11/04/2021)   aspirin EC 81 MG tablet Take 1 tablet (81 mg total) by mouth daily. Swallow whole. (Patient not taking: Reported on 11/04/2021)   butalbital-acetaminophen-caffeine (FIORICET) 50-325-40 MG tablet Take 1 tablet by mouth every 6 (six) hours as needed for headache. (Patient not taking: Reported on 11/04/2021)   dicyclomine (BENTYL) 10 MG capsule Take 1 capsule (10 mg total) by mouth 3 (three) times daily before meals.   docusate sodium (COLACE) 100 MG capsule Take 1 capsule (100 mg total) by mouth 2 (two) times daily. (Patient not taking: Reported on 11/04/2021)   ferrous sulfate 325 (65 FE) MG EC tablet Take 1 tablet (325 mg total) by mouth in the morning and at bedtime. (Patient not taking: Reported on 11/04/2021)   metroNIDAZOLE (FLAGYL) 500 MG tablet Take 1 tablet (500 mg total) by mouth 2 (two) times daily. One po bid x 7 days (Patient not taking: Reported on 11/04/2021)   ondansetron (ZOFRAN) 4 MG tablet Take 1 tablet (4 mg total) by mouth every 8 (eight) hours as needed for nausea or vomiting. (Patient not taking: Reported on 03/06/2022)   ondansetron (ZOFRAN-ODT) 4 MG disintegrating tablet Take 1 tablet (4 mg total) by mouth every 8 (eight) hours as needed for nausea or vomiting. (Patient not taking: Reported on 11/04/2021)   pantoprazole (PROTONIX) 20 MG tablet Take 1  tablet (20 mg total) by mouth daily.   Vitamin D, Ergocalciferol, (DRISDOL) 1.25 MG (50000 UNIT) CAPS capsule Take 1 capsule (50,000 Units total) by mouth every 7 (seven) days. (Patient not taking: Reported on 11/04/2021)   No facility-administered encounter medications on file as of 04/04/2022.    Past Medical History:  Diagnosis Date   Asthma    uses inhaler prn   Diabetes mellitus without complication ([place])    Gestational diabetes   Nausea and vomiting in pregnancy 12/28/2010   Sickle cell trait ([place])    Stroke ([place])    Unspecified high-risk pregnancy 01/27/2011    Past Surgical History:  Procedure Laterality Date   IR ANGIO INTRA EXTRACRAN SEL COM CAROTID INNOMINATE UNI R MOD SED  02/13/2020   IR ANGIO INTRA EXTRACRAN SEL COM CAROTID INNOMINATE UNI R MOD SED  02/15/2020   IR ANGIO INTRA EXTRACRAN SEL INTERNAL CAROTID UNI L MOD SED  02/13/2020   IR ANGIO VERTEBRAL SEL VERTEBRAL UNI R MOD SED  02/13/2020   IR CT HEAD LTD  02/13/2020   IR CT HEAD LTD  02/15/2020   IR PTA VENOUS EXCEPT DIALYSIS CIRCUIT  02/15/2020   IR THROMBECT VENO MECH MOD SED  02/13/2020   IR THROMBECT VENO MECH REPT MOD SED  02/15/2020   RADIOLOGY WITH ANESTHESIA N/A 02/13/2020   Procedure: IR WITH ANESTHESIA;  Surgeon: Luanne Bras, MD;  Location: [place];  Service: Radiology;  Laterality: N/A;  RADIOLOGY WITH ANESTHESIA N/A 02/15/2020   Procedure: IR WITH ANESTHESIA;  Surgeon: Radiologist, Medication, MD;  Location: [place] [facility];  Service: Radiology;  Laterality: N/A;   TONSILLECTOMY     WISDOM TOOTH EXTRACTION      Family History  Problem Relation Age of Onset   Sickle cell trait Mother    Depression Mother    Asthma Father    Drug abuse Father        heroin abuse   Kidney disease Father        due to heroin abuse   Diabetes Maternal Grandmother    Sleep apnea Neg Hx    Snoring Neg Hx     Social History   Socioeconomic History   Marital status: Single    Spouse name: Not on file   Number of children:  2   Years of education: Not on file   Highest education level: Not on file  Occupational History   Not on file  Tobacco Use   Smoking status: Former   Smokeless tobacco: Never  Substance and Sexual Activity   Alcohol use: No   Drug use: No   Sexual activity: Yes    Birth control/protection: None, Condom, Abstinence    Comment: h/o implant, depo  Other Topics Concern   Not on file  Social History Narrative   ** Merged History Encounter **       Lives with mother and her 2 kids Left Handed None    Social Determinants of Health   Financial Resource Strain: Not on file  Food Insecurity: Not on file  Transportation Needs: Not on file  Physical Activity: Not on file  Stress: Not on file  Social Connections: Not on file  Intimate Partner Violence: Not on file    ROS Comprehensive ROS Pertinent positive and negative noted in HPI       Objective    There were no vitals taken for this visit.  Physical Exam Physical exam: General: Vital signs reviewed.  Patient is well-developed and well-nourished, xx in no acute distress and cooperative with exam. Head: Normocephalic and atraumatic. Eyes: EOMI, conjunctivae normal, no scleral icterus. Neck: Supple, trachea midline, normal ROM, no JVD, masses, thyromegaly, or carotid bruit present. Cardiovascular: RRR, S1 normal, S2 normal, no murmurs, gallops, or rubs. Pulmonary/Chest: Clear to auscultation bilaterally, no wheezes, rales, or rhonchi. Abdominal: Soft, non-tender, non-distended, BS +, no masses, organomegaly, or guarding present. Musculoskeletal: No joint deformities, erythema, or stiffness, ROM full and nontender. Extremities: No lower extremity edema bilaterally,  pulses symmetric and intact bilaterally. No cyanosis or clubbing. Neurological: A&O x3, Strength is normal Skin: Warm, dry and intact. No rashes or erythema. Psychiatric: Normal mood and affect. speech and behavior is normal. Cognition and memory are normal.     {Labs (Optional):23779}    Assessment & Plan:   Problem List Items Addressed This Visit   None   No follow-ups on file.   Kerin Perna, NP

## 2022-04-05 NOTE — Progress Notes (Signed)
Patient has a PCP explained can not see 2 providers

## 2022-04-06 ENCOUNTER — Encounter: Payer: Self-pay | Admitting: Plastic Surgery

## 2022-04-06 ENCOUNTER — Ambulatory Visit: Payer: Medicaid Other | Admitting: Plastic Surgery

## 2022-04-06 VITALS — BP 106/76 | HR 99 | Ht 61.0 in | Wt 160.4 lb

## 2022-04-06 DIAGNOSIS — M542 Cervicalgia: Secondary | ICD-10-CM | POA: Diagnosis not present

## 2022-04-06 DIAGNOSIS — D75839 Thrombocytosis, unspecified: Secondary | ICD-10-CM | POA: Diagnosis not present

## 2022-04-06 DIAGNOSIS — M546 Pain in thoracic spine: Secondary | ICD-10-CM | POA: Diagnosis not present

## 2022-04-06 DIAGNOSIS — N62 Hypertrophy of breast: Secondary | ICD-10-CM

## 2022-04-06 DIAGNOSIS — Z6830 Body mass index (BMI) 30.0-30.9, adult: Secondary | ICD-10-CM

## 2022-04-06 NOTE — Progress Notes (Signed)
Referring Provider Alvira Monday, [address] [place],  [place] [id number]   CC:  Chief Complaint  Patient presents with   consult      Jill Clarke is an 37 y.o. female.  HPI: Jill Clarke is a 37 year old female who presents today with complaints of upper back and neck pain shoulder grooving from her bra straps due to the large size of her breasts.  She is requesting a bilateral breast reduction.  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  She also has a diagnosis of diabetes but her last glucoses were in the normal range and her hemoglobin A1c is 5.0  No Known Allergies  Outpatient Encounter Medications as of 04/06/2022  Medication Sig   albuterol (VENTOLIN HFA) 108 (90 Base) MCG/ACT inhaler Inhale 2 puffs into the lungs every 6 (six) hours as needed for wheezing or shortness of breath. (Patient not taking: Reported on 04/06/2022)   apixaban (ELIQUIS) 5 MG TABS tablet Take 1 tablet (5 mg total) by mouth 2 (two) times daily. (Patient not taking: Reported on 04/06/2022)   aspirin EC 81 MG tablet Take 1 tablet (81 mg total) by mouth daily. Swallow whole. (Patient not taking: Reported on 04/06/2022)   butalbital-acetaminophen-caffeine (FIORICET) 50-325-40 MG tablet Take 1 tablet by mouth every 6 (six) hours as needed for headache. (Patient not taking: Reported on 04/06/2022)   dicyclomine (BENTYL) 10 MG capsule Take 1 capsule (10 mg total) by mouth 3 (three) times daily before meals. (Patient not taking: Reported on 04/06/2022)   docusate sodium (COLACE) 100 MG capsule Take 1 capsule (100 mg total) by mouth 2 (two) times daily. (Patient not taking: Reported on 04/06/2022)   ferrous sulfate 325 (65 FE) MG EC tablet Take 1 tablet (325 mg total) by mouth in the morning and at bedtime. (Patient not taking: Reported on 04/06/2022)   metroNIDAZOLE (FLAGYL) 500 MG tablet Take 1  tablet (500 mg total) by mouth 2 (two) times daily. One po bid x 7 days (Patient not taking: Reported on 04/06/2022)   ondansetron (ZOFRAN) 4 MG tablet Take 1 tablet (4 mg total) by mouth every 8 (eight) hours as needed for nausea or vomiting. (Patient not taking: Reported on 04/06/2022)   ondansetron (ZOFRAN-ODT) 4 MG disintegrating tablet Take 1 tablet (4 mg total) by mouth every 8 (eight) hours as needed for nausea or vomiting. (Patient not taking: Reported on 04/06/2022)   pantoprazole (PROTONIX) 20 MG tablet Take 1 tablet (20 mg total) by mouth daily. (Patient not taking: Reported on 04/06/2022)   Vitamin D, Ergocalciferol, (DRISDOL) 1.25 MG (50000 UNIT) CAPS capsule Take 1 capsule (50,000 Units total) by mouth every 7 (seven) days. (Patient not taking: Reported on 04/06/2022)   No facility-administered encounter medications on file as of 04/06/2022.     Past Medical History:  Diagnosis Date   Asthma    uses inhaler prn   Diabetes mellitus without complication ([place])    Gestational diabetes   Nausea and vomiting in pregnancy 12/28/2010   Sickle cell trait ([place])    Stroke ([place])    Unspecified high-risk pregnancy 01/27/2011    Past Surgical History:  Procedure Laterality Date   IR ANGIO INTRA EXTRACRAN SEL COM CAROTID INNOMINATE UNI R MOD SED  02/13/2020   IR ANGIO INTRA EXTRACRAN SEL COM CAROTID INNOMINATE UNI R MOD SED  02/15/2020   IR ANGIO INTRA EXTRACRAN SEL  INTERNAL CAROTID UNI L MOD SED  02/13/2020   IR ANGIO VERTEBRAL SEL VERTEBRAL UNI R MOD SED  02/13/2020   IR CT HEAD LTD  02/13/2020   IR CT HEAD LTD  02/15/2020   IR PTA VENOUS EXCEPT DIALYSIS CIRCUIT  02/15/2020   IR THROMBECT VENO MECH MOD SED  02/13/2020   IR THROMBECT VENO MECH REPT MOD SED  02/15/2020   RADIOLOGY WITH ANESTHESIA N/A 02/13/2020   Procedure: IR WITH ANESTHESIA;  Surgeon: Luanne Bras, MD;  Location: [facility];  Service: Radiology;  Laterality: N/A;   RADIOLOGY WITH ANESTHESIA N/A 02/15/2020   Procedure: IR WITH  ANESTHESIA;  Surgeon: Radiologist, Medication, MD;  Location: [facility];  Service: Radiology;  Laterality: N/A;   TONSILLECTOMY     WISDOM TOOTH EXTRACTION      Family History  Problem Relation Age of Onset   Sickle cell trait Mother    Depression Mother    Asthma Father    Drug abuse Father        heroin abuse   Kidney disease Father        due to heroin abuse   Diabetes Maternal Grandmother    Sleep apnea Neg Hx    Snoring Neg Hx     Social History   Social History Narrative   ** Merged History Encounter **       Lives with mother and her 2 kids Left Handed None      Review of Systems General: Denies fevers, chills, weight loss CV: Denies chest pain, shortness of breath, palpitations Breasts: Patient denies any problems with her breast other than large size.  She states that she has difficulty finding bras that fit and the ones that she does wear leave grooves in her shoulders.  Physical Exam    04/06/2022   10:17 AM 03/06/2022    3:15 PM 01/19/2022    3:45 PM  Vitals with BMI  Height '5\' 1"'$  '5\' 2"'$    Weight 160 lbs 6 oz 165 lbs 164 lbs  BMI 123XX123 [id number]   Systolic A999333 123XX123 [id number]  Diastolic 76 84 67  Pulse 99 99 106    General:  No acute distress,  Alert and oriented, Non-Toxic, Normal speech and affect Breast: Patient has large breasts with grade 3 ptosis.  There are no easily palpable dominant masses and the nipples are normal in appearance without discharge.  Her sternal notch to nipple distance on the right is 36 cm and 36 cm on the left the fold to nipple distance on the right is 16 cm and 15 cm on the left Mammogram: Patient is 36 and no mammograms are available.  . Assessment/Plan Symptomatic macromastia: Patient has large breast and she clearly has grooving of the shoulders from her bra straps.  I can easily remove 600 g per breast.  We discussed breast reductions at length including the location of the incisions and the unpredictable nature of scarring.  We discussed  the risks of bleeding, infection, seroma formation postoperatively.  We discussed the use of drains.  We discussed the need for compression postoperatively.  We discussed the specific risks of loss of the nipple due to loss of blood supply.  We also discussed the fact that there may be difficulty with interpretation of mammograms after breast reduction.  Postoperative restrictions will include no heavy lifting greater than 20 pounds, no vigorous activity, and no submerging the incisions in water for 6 weeks.  All questions were answered to her satisfaction.  Will need to have neurology and primary care clearance prior to surgery.  If she requires anticoagulation due to her thrombocytosis will have to reconsider her surgery.  Camillia Herter 04/06/2022, 12:58 PM

## 2022-05-03 ENCOUNTER — Telehealth: Payer: Self-pay | Admitting: Family Medicine

## 2022-05-03 ENCOUNTER — Telehealth: Payer: Self-pay | Admitting: Plastic Surgery

## 2022-05-03 NOTE — Telephone Encounter (Signed)
Pt called to see if we have received the surgical clearance from her PCP so we can schedule surgery. Please call pt [phone]

## 2022-05-03 NOTE — Telephone Encounter (Signed)
New message   The patient is asking for another recommendation  from NP - surgery

## 2022-05-04 ENCOUNTER — Telehealth: Payer: Self-pay | Admitting: *Deleted

## 2022-05-04 NOTE — Telephone Encounter (Signed)
Request for surgical clearance faxed to Gilmore Laroche, FNP and Dr. Rachel Moulds, with fax confirmation received.

## 2022-05-04 NOTE — Telephone Encounter (Signed)
Kindly clarify the patient's recommendation request

## 2022-05-04 NOTE — Telephone Encounter (Signed)
Returned pt's call. NA/LVM 

## 2022-05-04 NOTE — Telephone Encounter (Signed)
Pt called back. Advised that her case had not yet been submitted for insurance authorization but should be in the next week. Also that I would send pre-op clearance requests to her providers per Dr. Lubertha Basque note in the surgicel route and someone from the office would contact her when we have more information regarding scheduling surgery. She expressed understanding

## 2022-05-05 ENCOUNTER — Telehealth: Payer: Self-pay | Admitting: Plastic Surgery

## 2022-05-05 NOTE — Telephone Encounter (Signed)
Surgical clearance request faxed to Dr. Pearlean Brownie with fax confirmation received

## 2022-05-05 NOTE — Telephone Encounter (Signed)
Faxed clinicals for preauthorization for surgical procedure to [employer].

## 2022-05-08 ENCOUNTER — Telehealth: Payer: Self-pay | Admitting: *Deleted

## 2022-05-08 NOTE — Telephone Encounter (Signed)
Jill Clarke, Jill Clarke,  Given her medical issues, she should not be scheduled for surgery. The clearance, especially for her thrombocytosis, is a requirement for scheduling surgery.   Thanks, Ree Kida

## 2022-05-08 NOTE — Telephone Encounter (Signed)
Received faxed request from [facility] for surgical clearance.  Noted pt has not returned to clinic post no show appts- with previous contact by nurse regarding need to follow up with MD per known diagnosis. ( No show x 3 appts).  Per MD - cannot give clearance due to above and need for follow up.  Fax returned with above information.

## 2022-05-15 ENCOUNTER — Encounter: Payer: Self-pay | Admitting: Physician Assistant

## 2022-05-17 ENCOUNTER — Telehealth: Payer: Self-pay | Admitting: *Deleted

## 2022-05-17 ENCOUNTER — Telehealth: Payer: Self-pay | Admitting: Plastic Surgery

## 2022-05-17 ENCOUNTER — Other Ambulatory Visit (HOSPITAL_COMMUNITY): Payer: Self-pay

## 2022-05-17 NOTE — Telephone Encounter (Signed)
This RN called pt to inquire about need to follow up in this office per prior visits and coordination of care needed for requested medical procedures.  Obtained VM- general message left requesting a return call to Dr Remonia Richter nurse per above.

## 2022-05-17 NOTE — Telephone Encounter (Signed)
I spoke with Ms. Thayer and explained Dr. Al Pimple did not clear her for surgery and that she would need an appointment with her for evaluation before we could consider scheduling surgery. Advised her to contact our office after she has f/u with Dr. Al Pimple. She verbalized understanding

## 2022-05-17 NOTE — Telephone Encounter (Signed)
Notified pt of insurance denial and stating that it was not medically necessary and per [employer] rep, it usually only gets approved if there is [place] involved.  Explained appeal rights and to let us know if that is what she decides to do.

## 2022-05-18 ENCOUNTER — Other Ambulatory Visit (HOSPITAL_COMMUNITY): Payer: Self-pay

## 2022-05-19 ENCOUNTER — Telehealth: Payer: Self-pay | Admitting: *Deleted

## 2022-05-19 NOTE — Telephone Encounter (Signed)
This RN was able to speak with pt per phone call and concern that she is not on medications or follow up for her increased platelets.  Note pt was wanting to obtain surgery for breast reduction- and needed clearance - per chart review noted pt canceled follow up post prior visit to discuss proceeding with appropriate medications.  Saydee stated she understood that " something is going on in my head that I need to take medication for "-   This RN clarified that her body makes too many platelets which can cause a clot - which if a clot happens in her head could be very debilitating or even take her life.  Pt had multiple questions including " why are we worried about this now - I haven't had any problems "  This RN explained her questions can be best answered by the MD- but generally her platelets could continue to get higher if not on medication there fore every day she does not take medication could put her at higher risk.  Pt stated concern about " having to be on a pill for the rest of my life"  This RN validated above - again reassuring her that a lot of people have to be on medications for their entire life but that allows them to be as healthy as they can for as long as they can.  Per end of discussion- pt understood reason her current surgery cannot be cleared by this office and she is willing to come in and discuss proceeding with medications for best medical outcome.  Appointment scheduled by this RN.

## 2022-05-20 ENCOUNTER — Emergency Department (HOSPITAL_COMMUNITY)
Admission: EM | Admit: 2022-05-20 | Discharge: 2022-05-20 | Discharge status: Against Medical Advice | Payer: Medicaid Other | Attending: Emergency Medicine | Admitting: Emergency Medicine

## 2022-05-20 ENCOUNTER — Emergency Department (HOSPITAL_COMMUNITY): Payer: Medicaid Other

## 2022-05-20 ENCOUNTER — Encounter (HOSPITAL_COMMUNITY): Payer: Self-pay

## 2022-05-20 DIAGNOSIS — Z7982 Long term (current) use of aspirin: Secondary | ICD-10-CM | POA: Insufficient documentation

## 2022-05-20 DIAGNOSIS — Z8673 Personal history of transient ischemic attack (TIA), and cerebral infarction without residual deficits: Secondary | ICD-10-CM | POA: Diagnosis not present

## 2022-05-20 DIAGNOSIS — E119 Type 2 diabetes mellitus without complications: Secondary | ICD-10-CM | POA: Insufficient documentation

## 2022-05-20 DIAGNOSIS — Z7901 Long term (current) use of anticoagulants: Secondary | ICD-10-CM | POA: Insufficient documentation

## 2022-05-20 DIAGNOSIS — R531 Weakness: Secondary | ICD-10-CM | POA: Insufficient documentation

## 2022-05-20 DIAGNOSIS — J45909 Unspecified asthma, uncomplicated: Secondary | ICD-10-CM | POA: Diagnosis not present

## 2022-05-20 DIAGNOSIS — I6381 Other cerebral infarction due to occlusion or stenosis of small artery: Secondary | ICD-10-CM | POA: Diagnosis not present

## 2022-05-20 DIAGNOSIS — M546 Pain in thoracic spine: Secondary | ICD-10-CM | POA: Diagnosis not present

## 2022-05-20 DIAGNOSIS — M542 Cervicalgia: Secondary | ICD-10-CM | POA: Diagnosis not present

## 2022-05-20 DIAGNOSIS — Z5329 Procedure and treatment not carried out because of patient's decision for other reasons: Secondary | ICD-10-CM | POA: Insufficient documentation

## 2022-05-20 DIAGNOSIS — R0789 Other chest pain: Secondary | ICD-10-CM | POA: Diagnosis not present

## 2022-05-20 LAB — URINALYSIS, W/ REFLEX TO CULTURE (INFECTION SUSPECTED)
Bacteria, UA: NONE SEEN
Bilirubin Urine: NEGATIVE
Glucose, UA: NEGATIVE mg/dL
Hgb urine dipstick: NEGATIVE
Ketones, ur: NEGATIVE mg/dL
Nitrite: NEGATIVE
Protein, ur: NEGATIVE mg/dL
Specific Gravity, Urine: 1.015 (ref 1.005–1.030)
pH: 7 (ref 5.0–8.0)

## 2022-05-20 LAB — DIFFERENTIAL
Abs Immature Granulocytes: 0.14 10*3/uL — ABNORMAL HIGH (ref 0.00–0.07)
Basophils Absolute: 0.1 10*3/uL (ref 0.0–0.1)
Basophils Relative: 1 %
Eosinophils Absolute: 0.3 10*3/uL (ref 0.0–0.5)
Eosinophils Relative: 3 %
Immature Granulocytes: 1 %
Lymphocytes Relative: 34 %
Lymphs Abs: 3.5 10*3/uL (ref 0.7–4.0)
Monocytes Absolute: 1 10*3/uL (ref 0.1–1.0)
Monocytes Relative: 9 %
Neutro Abs: 5.3 10*3/uL (ref 1.7–7.7)
Neutrophils Relative %: 52 %

## 2022-05-20 LAB — CBC
HCT: 35 % — ABNORMAL LOW (ref 36.0–46.0)
Hemoglobin: 11.2 g/dL — ABNORMAL LOW (ref 12.0–15.0)
MCH: 30 pg (ref 26.0–34.0)
MCHC: 32 g/dL (ref 30.0–36.0)
MCV: 93.8 fL (ref 80.0–100.0)
Platelets: 720 10*3/uL — ABNORMAL HIGH (ref 150–400)
RBC: 3.73 MIL/uL — ABNORMAL LOW (ref 3.87–5.11)
RDW: 13.2 % (ref 11.5–15.5)
WBC: 10.4 10*3/uL (ref 4.0–10.5)
nRBC: 0 % (ref 0.0–0.2)

## 2022-05-20 LAB — COMPREHENSIVE METABOLIC PANEL
ALT: 12 U/L (ref 0–44)
AST: 13 U/L — ABNORMAL LOW (ref 15–41)
Albumin: 3.8 g/dL (ref 3.5–5.0)
Alkaline Phosphatase: 70 U/L (ref 38–126)
Anion gap: 9 (ref 5–15)
BUN: 14 mg/dL (ref 6–20)
CO2: 22 mmol/L (ref 22–32)
Calcium: 8.8 mg/dL — ABNORMAL LOW (ref 8.9–10.3)
Chloride: 105 mmol/L (ref 98–111)
Creatinine, Ser: 0.63 mg/dL (ref 0.44–1.00)
GFR, Estimated: >60 mL/min (ref >60)
Glucose, Bld: 99 mg/dL (ref 70–99)
Potassium: 3.5 mmol/L (ref 3.5–5.1)
Sodium: 136 mmol/L (ref 135–145)
Total Bilirubin: 0.5 mg/dL (ref 0.3–1.2)
Total Protein: 7.2 g/dL (ref 6.5–8.1)

## 2022-05-20 LAB — RAPID URINE DRUG SCREEN, HOSP PERFORMED
Amphetamines: NOT DETECTED
Barbiturates: NOT DETECTED
Benzodiazepines: NOT DETECTED
Cocaine: NOT DETECTED
Opiates: NOT DETECTED
Tetrahydrocannabinol: NOT DETECTED

## 2022-05-20 LAB — I-STAT CHEM 8, ED
BUN: 14 mg/dL (ref 6–20)
Calcium, Ion: 1.26 mmol/L (ref 1.15–1.40)
Chloride: 103 mmol/L (ref 98–111)
Creatinine, Ser: 0.6 mg/dL (ref 0.44–1.00)
Glucose, Bld: 93 mg/dL (ref 70–99)
HCT: 36 % (ref 36.0–46.0)
Hemoglobin: 12.2 g/dL (ref 12.0–15.0)
Potassium: 3.6 mmol/L (ref 3.5–5.1)
Sodium: 141 mmol/L (ref 135–145)
TCO2: 25 mmol/L (ref 22–32)

## 2022-05-20 LAB — ETHANOL: Alcohol, Ethyl (B): <10 mg/dL (ref <10)

## 2022-05-20 LAB — I-STAT BETA HCG BLOOD, ED (MC, WL, AP ONLY): I-stat hCG, quantitative: <5 m[IU]/mL (ref <5)

## 2022-05-20 LAB — PROTIME-INR
INR: 1 (ref 0.8–1.2)
Prothrombin Time: 12.9 seconds (ref 11.4–15.2)

## 2022-05-20 LAB — TROPONIN I (HIGH SENSITIVITY)
Troponin I (High Sensitivity): <2 ng/L (ref <18)
Troponin I (High Sensitivity): <2 ng/L (ref <18)

## 2022-05-20 LAB — APTT: aPTT: 29 seconds (ref 24–36)

## 2022-05-20 MED ORDER — SODIUM CHLORIDE 0.9 % IV SOLN: 1000.0000 mL | INTRAVENOUS | Status: DC

## 2022-05-20 MED ORDER — SODIUM CHLORIDE 0.9 % IV BOLUS (SEPSIS)
1000.0000 mL | Freq: Once | INTRAVENOUS | Status: AC
  Administered 2022-05-20: 1000 mL via INTRAVENOUS

## 2022-05-20 NOTE — ED Notes (Signed)
Attempted to get urine from patient"I'm dehydrated so no"

## 2022-05-20 NOTE — ED Provider Notes (Signed)
[place] EMERGENCY DEPARTMENT AT [facility] Provider Note  CSN: [id number] Arrival date & time: 05/20/22 0022  Chief Complaint(s) Weakness  HPI Jill Clarke is a 37 y.o. 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  Patient reported feeling a tightness in her left upper chest, back, and arm.  She reports that this feels similar to the prior stroke that she had in 2022.  She denies any falls or trauma.  No visual changes.  No speech changes or occultly swallowing.  Chest pain is tightness.  Nonexertional.  Worse with movement and palpation.  No associated shortness of breath, nausea, vomiting.  She denies any recent fevers or infections.  The history is provided by the patient.    Past Medical History Past Medical History:  Diagnosis Date   Asthma    uses inhaler prn   Diabetes mellitus without complication (HCC)    Gestational diabetes   Nausea and vomiting in pregnancy 12/28/2010   Sickle cell trait (HCC)    Stroke (HCC)    Unspecified high-risk pregnancy 01/27/2011   Patient Active Problem List   Diagnosis Date Noted   Encounter for routine adult physical exam with abnormal findings 03/07/2022   Abdominal bloating with cramps 01/19/2022   Left-sided headache 11/04/2021   Cervical cancer screening 08/15/2021   Frequent headaches 07/25/2021   Gastroesophageal reflux disease 04/15/2020   Neurologic neglect syndrome    Hypotension    Cerebral vein thrombosis 02/28/2020   Hypokalemia 02/28/2020   Mastoiditis    Acute blood loss anemia    Seizures (HCC)    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 Authorizing Provider  albuterol (VENTOLIN HFA) 108 (90 Base) MCG/ACT inhaler Inhale 2 puffs into the lungs every 6 (six) hours as needed for wheezing or shortness of breath. Patient not taking: Reported on 04/06/2022 03/06/22   Del Nigel Berthold, FNP  apixaban (ELIQUIS) 5 MG TABS tablet Take 1 tablet (5 mg total) by mouth 2 (two) times daily. Patient not taking: Reported on 04/06/2022 03/17/21   Rachel Moulds, MD  aspirin EC 81 MG tablet Take 1 tablet (81 mg total) by mouth daily. Swallow whole. Patient not taking: Reported on 04/06/2022 02/07/21   Ihor Austin, NP  butalbital-acetaminophen-caffeine (FIORICET) [phone] MG tablet Take 1 tablet by mouth every 6 (six) hours as needed for headache. Patient not taking: Reported on 04/06/2022 10/26/21 10/26/22  Pricilla Loveless, MD  dicyclomine (BENTYL) 10 MG capsule Take 1 capsule (10 mg total) by mouth 3 (three) times daily before meals. Patient not taking: Reported on 04/06/2022 03/06/22   Del Nigel Berthold, FNP  docusate sodium (COLACE) 100 MG capsule Take 1 capsule (100 mg total) by mouth 2 (two) times daily. Patient not taking: Reported on 04/06/2022 03/09/21   Rachel Moulds, MD  ferrous sulfate 325 (65 FE) MG EC tablet Take 1 tablet (325 mg total) by mouth in the morning and at bedtime. Patient not taking: Reported on 04/06/2022 03/09/21   Rachel Moulds, MD  metroNIDAZOLE (FLAGYL) 500 MG tablet Take 1 tablet (500 mg total) by mouth 2 (two) times daily. One po bid x 7 days Patient not taking: Reported on 04/06/2022 10/26/21    Pricilla Loveless, MD  ondansetron (ZOFRAN) 4 MG tablet Take 1 tablet (4 mg total) by mouth every 8 (eight) hours as needed for nausea or vomiting. Patient not taking: Reported on 04/06/2022 01/19/22   Del Nigel Berthold, FNP  ondansetron (ZOFRAN-ODT) 4 MG disintegrating tablet Take 1 tablet (4 mg total) by mouth every 8 (eight) hours as needed for nausea or vomiting. Patient not taking: Reported on 04/06/2022 05/03/21   Gailen Shelter, PA  pantoprazole (PROTONIX) 20 MG tablet Take 1 tablet (20 mg total) by mouth daily. Patient not taking: Reported on 04/06/2022 03/06/22   Del Nigel Berthold, FNP  Vitamin D, Ergocalciferol, (DRISDOL) 1.25 MG (50000 UNIT) CAPS capsule Take 1 capsule (50,000 Units total) by mouth every 7 (seven) days. Patient not taking: Reported on 04/06/2022 07/27/21   Gilmore Laroche, FNP                                                                                                                                    Allergies Patient has no known allergies.  Review of Systems Review of Systems As noted in HPI  Physical Exam Vital Signs  I have reviewed the triage vital signs BP 105/78   Pulse 87   Temp 98.2 F (36.8 C) (Oral)   Resp 19   SpO2 99%   Physical Exam Vitals reviewed.  Constitutional:      General: She is not in acute distress.    Appearance: She is well-developed. She is not diaphoretic.  HENT:     Head: Normocephalic and atraumatic.     Nose: Nose normal.  Eyes:     General: No scleral icterus.       Right eye: No discharge.        Left eye: No discharge.     Conjunctiva/sclera: Conjunctivae normal.     Pupils: Pupils are equal, round, and reactive to light.  Cardiovascular:     Rate and Rhythm: Normal rate and regular rhythm.     Heart sounds: No murmur heard.    No friction rub. No gallop.  Pulmonary:     Effort: Pulmonary effort is normal. No respiratory distress.     Breath sounds: Normal breath sounds. No stridor. No rales.   Chest:     Chest wall: Tenderness present.    Abdominal:     General: There is no distension.     Palpations: Abdomen is soft.     Tenderness: There is no abdominal tenderness.  Musculoskeletal:     Cervical  back: Normal range of motion and neck supple. Tenderness present.     Thoracic back: Tenderness present.       Back:  Skin:    General: Skin is warm and dry.     Findings: No erythema or rash.  Neurological:     Mental Status: She is alert and oriented to person, place, and time.     Comments: Mental Status:  Alert and oriented to person, place, and time.  Attention and concentration normal.  Speech clear.  Recent memory is intact  Cranial Nerves:  II Visual Fields: Intact to confrontation. Visual fields intact. III, IV, VI: Pupils equal and reactive to light and near. Full eye movement without nystagmus  V Facial Sensation: Normal. No weakness of masticatory muscles  VII: No facial weakness or asymmetry  VIII Auditory Acuity: Grossly normal  IX/X: The uvula is midline; the palate elevates symmetrically  XI: Normal sternocleidomastoid and trapezius strength  XII: The tongue is midline. No atrophy or fasciculations.   Motor System: Muscle Strength: 3/4 in the left upper and lower extremities yet slow and purposeful movement. 5/5 in the right upper and lower extremities.   Muscle Tone: Tone and muscle bulk are normal in the upper and lower extremities.  Coordination:  No tremor.  Sensation: Intact to light touch   Gait: deferred      ED Results and Treatments Labs (all labs ordered are listed, but only abnormal results are displayed) Labs Reviewed  CBC - Abnormal; Notable for the following components:      Result Value   RBC 3.73 (*)    Hemoglobin 11.2 (*)    HCT 35.0 (*)    Platelets 720 (*)    All other components within normal limits  DIFFERENTIAL - Abnormal; Notable for the following components:   Abs Immature Granulocytes 0.14 (*)    All other  components within normal limits  COMPREHENSIVE METABOLIC PANEL - Abnormal; Notable for the following components:   Calcium 8.8 (*)    AST 13 (*)    All other components within normal limits  ETHANOL  PROTIME-INR  APTT  RAPID URINE DRUG SCREEN, HOSP PERFORMED  URINALYSIS, W/ REFLEX TO CULTURE (INFECTION SUSPECTED)  I-STAT CHEM 8, ED  I-STAT BETA HCG BLOOD, ED ([facility], [facility], [facility] ONLY)  TROPONIN I (HIGH SENSITIVITY)  TROPONIN I (HIGH SENSITIVITY)                                                                                                                         EKG  EKG Interpretation  Date/Time:  Saturday May 20 2022 00:33:26 EDT Ventricular Rate:  97 PR Interval:  173 QRS Duration: 89 QT Interval:  344 QTC Calculation: 437 R Axis:   72 Text Interpretation: Sinus rhythm Rate faster Confirmed by Drema Pry [phone]) on 05/20/2022 1:16:36 AM       Radiology CT HEAD CODE STROKE WO CONTRAST  Result Date: 05/20/2022 CLINICAL DATA:  Code stroke. EXAM: CT HEAD WITHOUT  CONTRAST TECHNIQUE: Contiguous axial images were obtained from the base of the skull through the vertex without intravenous contrast. RADIATION DOSE REDUCTION: This exam was performed according to the departmental dose-optimization program which includes automated exposure control, adjustment of the mA and/or kV according to patient size and/or use of iterative reconstruction technique. COMPARISON:  Prior study from 10/26/2021. FINDINGS: Brain: Cerebral volume within normal limits. Chronic infarct at the left parietal convexity present. Few additional chronic lacunar infarcts about the subcortical frontal lobes. Minor chronic microvascular ischemic disease. No acute intracranial hemorrhage. No acute large vessel territory infarct. No mass lesion, mass effect or midline shift. No hydrocephalus or extra-axial fluid collection. Vascular: No abnormal hyperdense vessel. Skull: Scalp soft tissues demonstrate no acute finding. Calvarium  intact. Sinuses/Orbits: Globes and orbital soft tissues within normal limits. Paranasal sinuses are clear. No mastoid effusion. Other: None. ASPECTS [facility] Stroke Program Early CT Score) - Ganglionic level infarction (caudate, lentiform nuclei, internal capsule, insula, M1-M3 cortex): 7 - Supraganglionic infarction (M4-M6 cortex): The Total score (0-10 with 10 being normal): 10 IMPRESSION: 1. No acute intracranial abnormality. 2. ASPECTS is 10. 3. Chronic cortical infarct at the left parietal lobe, with a few additional chronic lacunar infarcts at the anterior frontal lobes. Underlying mild chronic microvascular ischemic disease. Overall, appearance is relatively stable as compared to MRI from 10/26/2021. Results were called by telephone at the time of interpretation on 05/20/2022 at 1:05 am to provider [facility] , who verbally acknowledged these results. Electronically Signed   By: Rise Mu M.D.   On: 05/20/2022 01:08    Medications Ordered in ED Medications - No data to display                                                                                                                                   Procedures Procedures  (including critical care time)  Medical Decision Making / ED Course  Click here for ABCD2, HEART and other calculators  Medical Decision Making Amount and/or Complexity of Data Reviewed Labs: ordered. Radiology: ordered.  Risk Prescription drug management.    This patient presents to the ED for concern of left-sided weakness, this involves an extensive number of treatment options, and is a complaint that carries with it a high risk of complications and morbidity. The differential diagnosis includes but not limited to:  Given the patient's prior history, code stroke was initiated to rule out CVA.  Will also assess for recurrence of her sinus thrombosis.  Will rule out ICH though I have low suspicion for this. The patient is extremely tender to  palpation in the left shoulder girdle region which I believe is the cause of her left upper and back pain.  This may also be contributing to the patient's reported weakness.  But will get cardiac work up to rule out ACS. Will also assess for any electrolyte or metabolic derangements. Doubt dissection at time, but may reconsider  Work up Interpretation and Management:  Cardiac Monitoring/EKG: EKG without acute ischemic changes, dysrhythmias, blocks.  Laboratory Tests ordered listed below with my independent interpretation: CBC without leukocytosis.  Mild anemia with stable hemoglobin. CMP without significant electrolyte derangements or renal sufficiency. EtOH negative. hCG ordered to assist in guiding care which was negative. Initial troponin negative   Imaging Studies ordered listed below with my independent interpretation: CT head without evidence of ICH.  Radiology did see any evidence concerning for acute occlusion.  Patient has chronic cortical changes from prior infarcts.    Consult: Teleneurology evaluated the patient and recommended she get an MRI brain and MRV to assess for any acute process and to determine whether the patient requires anticoagulation. I called and spoke with Dr. Iver Nestle regarding possible admission for work up. She recommended getting MRI first and determining need for admission at that time.  Reassessment: Patient is remaining hemodynamically stable.  3:10 AM: patient's weakness and left shoulder girdle pain has resolved.  Clinical Course as of 05/20/22 1610  Sat May 20, 2022  0500 RN reported that patient was up and walking to the bathroom on her own. No weakness. [PC]  [phone] Patient requested to left AMA and did not want to wait for her MRI.  [PC]    Clinical Course User Index [PC] Harlis Champoux, Amadeo Garnet, MD      Final Clinical Impression(s) / ED Diagnoses Final diagnoses:  Left-sided weakness           This chart was dictated using voice  recognition software.  Despite best efforts to proofread,  errors can occur which can change the documentation meaning.    Nira Conn, MD 05/20/22 [phone]

## 2022-05-20 NOTE — ED Notes (Signed)
Patient to Ct. 

## 2022-05-20 NOTE — ED Notes (Signed)
Called to room, patient requesting to leave. Physician notified

## 2022-05-20 NOTE — ED Triage Notes (Signed)
Pt presents to ED for evaluation of left side chest squeezing and sharp pain with intermittent numbness on left side of face. Noted weakness in grip to the left hand. Denies difficulty swallowing or changes in speech.

## 2022-05-20 NOTE — Consult Note (Signed)
[facility] Tele[facility]rology Consult Services   Patient Name:   Jill Clarke, Jill Clarke Date of Birth:   May 28, 1985 Identification Number:   MRN - [id number] Date of Service:   05/20/2022 00:55:40  Diagnosis:       I63.89 - Cerebrovascular accident (CVA) due to other mechanism (HCCC)       G93.41 - Encephalopathy Metabolic  Impression:      30F presents with feeling wrong and left weakness.    Not a thrombolytic candidate 2/2 hc ICH and SAH. Not an IR candidate 2/2NIH low (no severely newly debilitating symptoms and LVO not suspected), CTA (or alternative vessel imaging) can be done routinely. Rec toxic metabolic infectious workup (U/A, COVID/flu +/-RSV, chest xray, UDS, +/- NH3 etc as per ED MD/primary team discretion), rec admission for CVA workup and CTA H&N, MRI brain wo and MRV head for further evaluation. 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  Our recommendations are outlined below.  Recommendations:        Stroke/Telemetry Floor       Neuro Checks       Bedside Swallow Eval       DVT Prophylaxis       IV Fluids, Normal Saline       Head of Bed 30 Degrees       Euglycemia and Avoid Hyperthermia (PRN Acetaminophen)       Initiate or continue Aspirin 325 MG daily       Antihypertensives PRN if Blood pressure is greater than 220/120 or there is a concern for End organ damage/contraindications for permissive HTN. If blood pressure is greater than 220/120 give labetalol PO or IV or Vasotec IV with a goal of 15% reduction in BP during the first 24 hours.  Sign Out:       Discussed with Emergency Department Provider    ------------------------------------------------------------------------------  Advanced Imaging: Advanced Imaging Deferred because:  NIH low (no severely newly debilitating symptoms and LVO not suspected), CTA (or alternative vessel imaging)  can be done routinely   Metrics: Last Known Well: 05/19/2022 22:45:00 [facility] Notification Time: 05/20/2022 00:55:40 Arrival Time: 05/19/2022 23:45:00 Stamp Time: 05/20/2022 00:55:40 Initial Response Time: 05/20/2022 00:57:25 Symptoms: feeling wrong and left weakness. Initial patient interaction: 05/20/2022 01:05:14 NIHSS Assessment Completed: 05/20/2022 01:15:04 Patient is not a candidate for Thrombolytic. Thrombolytic Medical Decision: 05/20/2022 01:15:05 Patient was not deemed candidate for Thrombolytic because of following reasons: Current or Previous ICH.  I personally Reviewed the CT Head and it Showed no acute pathology  Primary Provider Notified of Diagnostic Impression and Management Plan on: 05/20/2022 01:22:31    ------------------------------------------------------------------------------  History of Present Illness: Patient is a 37 year old Female.  Patient was brought by private transportation with symptoms of feeling wrong and left weakness. 30F presents with "feeling wrong and left weakness". Was at mom's and didn't feel right and and had left-sided weakness. She was on a blood thinner but hasn't been on it 2/2 cost. Only took 30 day supply of it. She talked to her MD about not being able to afford it. Left side seems "tight". No vision changes.  LKN 22:45  Had a CVA 2022  Has normal headaches.   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  Past  Surgical History: There Is No Surgical History Contributory To Today's Visit    Examination: BP(117/79), Pulse(97), Blood Glucose(93) 1A: Level of Consciousness - Alert; keenly responsive + 0 1B: Ask Month and Age - Both Questions Right + 0 1C: Blink Eyes & Squeeze Hands - Performs Both Tasks + 0 2: Test Horizontal Extraocular Movements - Normal + 0 3: Test Visual Fields - Complete Hemianopia + 2 4: Test Facial Palsy (Use Grimace if Obtunded) - Normal symmetry + 0 5A: Test Left Arm Motor Drift - Drift, but doesn't hit bed + 1 5B: Test Right Arm Motor Drift - No Drift for 10 Seconds + 0 6A: Test Left Leg Motor Drift - Drift, but doesn't hit bed + 1 6B: Test Right Leg Motor Drift - No Drift for 5 Seconds + 0 7: Test Limb Ataxia (FNF/Heel-Shin) - No Ataxia + 0 8: Test Sensation - Normal; No sensory loss + 0 9: Test Language/Aphasia - Normal; No aphasia + 0 10: Test Dysarthria - Normal + 0 11: Test Extinction/Inattention - No abnormality + 0  NIHSS Score: 4  NIHSS Free Text : very slowed purposeful movements on the left  Pre-Morbid Modified Rankin Scale: 1 Points = No significant disability despite symptoms; able to carry out all usual duties and activities  Spoke with : Dr. Eudelia Bunch  Patient/Family was informed the Neurology Consult would occur via TeleHealth consult by way of interactive audio and video telecommunications and consented to receiving care in this manner.   Patient is being evaluated for possible acute neurologic impairment and high probability of imminent or life-threatening deterioration. I spent total of 25 minutes providing care to this patient, including time for face to face visit via telemedicine, review of medical records, imaging studies and discussion of findings with providers, the patient and/or family.   Dr Amedeo Kinsman   [facility] For Inpatient follow-up with [facility] physician please call [facility] [phone]. This is not an  outpatient service. Post hospital discharge, please contact hospital directly.  Please do not communicate with [facility] physicians via secure chat. If you have any questions, Please contact [facility]. Please call or reconsult our service if there are any clinical or diagnostic changes.

## 2022-05-20 NOTE — ED Notes (Signed)
Patient ambulated to restroom with no assistance.  

## 2022-05-20 NOTE — ED Notes (Signed)
Patient refused to sign ama form.

## 2022-05-20 NOTE — Progress Notes (Signed)
Responded to CODE stroke. Per unit RN, sufficient peripheral access has been obtained.

## 2022-05-22 ENCOUNTER — Telehealth: Payer: Self-pay

## 2022-05-22 NOTE — Transitions of Care (Post Inpatient/ED Visit) (Unsigned)
   05/22/2022  Name: Jill Clarke MRN: [id number] DOB: 1985/07/01  Today's TOC FU Call Status: Today's TOC FU Call Status:: Unsuccessul Call (1st Attempt) Unsuccessful Call (1st Attempt) Date: 05/22/22  Attempted to reach the patient regarding the most recent Inpatient/ED visit.  Follow Up Plan: Additional outreach attempts will be made to reach the patient to complete the Transitions of Care (Post Inpatient/ED visit) call.   Signature   Agnes Lawrence, CMA (AAMA)  [facility] Program [phone]

## 2022-05-24 NOTE — Transitions of Care (Post Inpatient/ED Visit) (Signed)
05/24/2022  Name: Jill Clarke MRN: [id number] DOB: 27-Sep-1985  Today's TOC FU Call Status: Today's TOC FU Call Status:: Successful TOC FU Call Competed Unsuccessful Call (1st Attempt) Date: 05/22/22 [facility] FU Call Complete Date: 05/24/22  Transition Care Management Follow-up Telephone Call Date of Discharge: 05/20/22 Discharge Facility: [facility]) Type of Discharge: Emergency Department Reason for ED Visit: Other: (left sided weakness) How have you been since you were released from the hospital?: Better Any questions or concerns?: No  Items Reviewed: Did you receive and understand the discharge instructions provided?: Yes Medications obtained,verified, and reconciled?: Yes (Medications Reviewed) Any new allergies since your discharge?: No Dietary orders reviewed?: NA Do you have support at home?: Yes  Medications Reviewed Today: Medications Reviewed Today     Reviewed by Santiago Glad, MD (Physician) on 04/06/22 at 1439  Med List Status: <None>   Medication Order Taking? Sig Documenting Provider Last Dose Status Informant  albuterol (VENTOLIN HFA) 108 (90 Base) MCG/ACT inhaler [id number] No Inhale 2 puffs into the lungs every 6 (six) hours as needed for wheezing or shortness of breath.  Patient not taking: Reported on 04/06/2022   Rica Records, FNP Not Taking Active   apixaban (ELIQUIS) 5 MG TABS tablet [id number] No Take 1 tablet (5 mg total) by mouth 2 (two) times daily.  Patient not taking: Reported on 04/06/2022   Rachel Moulds, MD Not Taking Active   aspirin EC 81 MG tablet [id number] No Take 1 tablet (81 mg total) by mouth daily. Swallow whole.  Patient not taking: Reported on 04/06/2022   Ihor Austin, NP Not Taking Active   butalbital-acetaminophen-caffeine (FIORICET) 50-325-40 MG tablet [id number] No Take 1 tablet by mouth every 6 (six) hours as needed for headache.  Patient not taking: Reported on 04/06/2022   Pricilla Loveless, MD Not Taking  Active   dicyclomine (BENTYL) 10 MG capsule [id number] No Take 1 capsule (10 mg total) by mouth 3 (three) times daily before meals.  Patient not taking: Reported on 04/06/2022   Rica Records, FNP Not Taking Active   docusate sodium (COLACE) 100 MG capsule [id number] No Take 1 capsule (100 mg total) by mouth 2 (two) times daily.  Patient not taking: Reported on 04/06/2022   Rachel Moulds, MD Not Taking Active   ferrous sulfate 325 (65 FE) MG EC tablet [id number] No Take 1 tablet (325 mg total) by mouth in the morning and at bedtime.  Patient not taking: Reported on 04/06/2022   Rachel Moulds, MD Not Taking Active   metroNIDAZOLE (FLAGYL) 500 MG tablet [id number] No Take 1 tablet (500 mg total) by mouth 2 (two) times daily. One po bid x 7 days  Patient not taking: Reported on 04/06/2022   Pricilla Loveless, MD Not Taking Active   ondansetron (ZOFRAN) 4 MG tablet [id number] No Take 1 tablet (4 mg total) by mouth every 8 (eight) hours as needed for nausea or vomiting.  Patient not taking: Reported on 04/06/2022   Rica Records, FNP Not Taking Active   ondansetron (ZOFRAN-ODT) 4 MG disintegrating tablet [id number] No Take 1 tablet (4 mg total) by mouth every 8 (eight) hours as needed for nausea or vomiting.  Patient not taking: Reported on 04/06/2022   Gailen Shelter, [place] Not Taking Active   pantoprazole (PROTONIX) 20 MG tablet [id number] No Take 1 tablet (20 mg total) by mouth daily.  Patient not taking: Reported on 04/06/2022   Rica Records, FNP Not Taking  Active   Vitamin D, Ergocalciferol, (DRISDOL) 1.25 MG (50000 UNIT) CAPS capsule [id number] No Take 1 capsule (50,000 Units total) by mouth every 7 (seven) days.  Patient not taking: Reported on 04/06/2022   Gilmore Laroche, FNP Not Taking Active             Home Care and Equipment/Supplies: Were Home Health Services Ordered?: NA Any new equipment or medical supplies ordered?: NA  Functional Questionnaire: Do  you need assistance with bathing/showering or dressing?: No Do you need assistance with meal preparation?: No Do you need assistance with eating?: No Do you have difficulty maintaining continence: No Do you need assistance with getting out of bed/getting out of a chair/moving?: No Do you have difficulty managing or taking your medications?: No  Follow up appointments reviewed: PCP Follow-up appointment confirmed?: NA (declined- stated she would call when she knew her schedule) Specialist Hospital Follow-up appointment confirmed?: NA Do you need transportation to your follow-up appointment?: No Do you understand care options if your condition(s) worsen?: Yes-patient verbalized understanding    SIGNATURE   Agnes Lawrence, CMA (AAMA)  [facility] [phone]

## 2022-05-29 ENCOUNTER — Other Ambulatory Visit (HOSPITAL_COMMUNITY): Payer: Self-pay

## 2022-05-31 ENCOUNTER — Inpatient Hospital Stay: Payer: Medicaid Other | Attending: Hematology and Oncology | Admitting: Hematology and Oncology

## 2022-05-31 ENCOUNTER — Encounter: Payer: Self-pay | Admitting: Hematology and Oncology

## 2022-05-31 ENCOUNTER — Other Ambulatory Visit: Payer: Self-pay

## 2022-05-31 VITALS — BP 125/84 | HR 92 | Temp 97.4°F | Resp 18 | Ht 61.0 in | Wt 162.1 lb

## 2022-05-31 DIAGNOSIS — Z1589 Genetic susceptibility to other disease: Secondary | ICD-10-CM

## 2022-05-31 DIAGNOSIS — D573 Sickle-cell trait: Secondary | ICD-10-CM | POA: Diagnosis not present

## 2022-05-31 DIAGNOSIS — Z79899 Other long term (current) drug therapy: Secondary | ICD-10-CM | POA: Diagnosis not present

## 2022-05-31 DIAGNOSIS — E119 Type 2 diabetes mellitus without complications: Secondary | ICD-10-CM | POA: Diagnosis not present

## 2022-05-31 DIAGNOSIS — Z841 Family history of disorders of kidney and ureter: Secondary | ICD-10-CM | POA: Insufficient documentation

## 2022-05-31 DIAGNOSIS — Z833 Family history of diabetes mellitus: Secondary | ICD-10-CM | POA: Insufficient documentation

## 2022-05-31 DIAGNOSIS — Z7901 Long term (current) use of anticoagulants: Secondary | ICD-10-CM | POA: Insufficient documentation

## 2022-05-31 DIAGNOSIS — I6381 Other cerebral infarction due to occlusion or stenosis of small artery: Secondary | ICD-10-CM | POA: Diagnosis not present

## 2022-05-31 DIAGNOSIS — D75839 Thrombocytosis, unspecified: Secondary | ICD-10-CM | POA: Diagnosis not present

## 2022-05-31 DIAGNOSIS — Z818 Family history of other mental and behavioral disorders: Secondary | ICD-10-CM | POA: Diagnosis not present

## 2022-05-31 DIAGNOSIS — Z825 Family history of asthma and other chronic lower respiratory diseases: Secondary | ICD-10-CM | POA: Insufficient documentation

## 2022-05-31 DIAGNOSIS — R531 Weakness: Secondary | ICD-10-CM | POA: Insufficient documentation

## 2022-05-31 DIAGNOSIS — D473 Essential (hemorrhagic) thrombocythemia: Secondary | ICD-10-CM | POA: Insufficient documentation

## 2022-05-31 DIAGNOSIS — Z8673 Personal history of transient ischemic attack (TIA), and cerebral infarction without residual deficits: Secondary | ICD-10-CM | POA: Diagnosis not present

## 2022-05-31 DIAGNOSIS — Z814 Family history of other substance abuse and dependence: Secondary | ICD-10-CM | POA: Insufficient documentation

## 2022-05-31 DIAGNOSIS — Z832 Family history of diseases of the blood and blood-forming organs and certain disorders involving the immune mechanism: Secondary | ICD-10-CM | POA: Insufficient documentation

## 2022-05-31 NOTE — Progress Notes (Signed)
Cancer Center CONSULT NOTE  Patient Care Team: Gilmore Laroche, FNP as PCP - General (Family Medicine)  CHIEF COMPLAINTS/PURPOSE OF CONSULTATION:  Thrombocytosis.  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 She is here for a follow up to discuss results and treatment recommendations. She was last seen by [place] in February 2023, refused all treatments we were to offer her. She is now back schedule a follow-up because she is anticipating breast reduction surgery and was hoping we could clear her to move forward with surgery.  Unfortunately she is not on any anticoagulation and she will remain a high risk for DVT/PE and other cardiovascular events in the perioperative.  She is willing to consider perioperative anticoagulation.  Hence have sent a message to the plastic surgeon to consider giving her Lovenox at therapeutic dose for about 2 weeks following surgery or until she is completely ambulatory.  After this she will benefit from indefinite anticoagulation with Eliquis.  Once again discussed the role of Hydrea, tried to explain to her the meaning of JAK2 mutation and why she is at risk of DVT/PE or other cardiovascular events from essential thrombocytosis.  She tells me that she does not want to put any kind of medications into her body.  She wants to consider anticoagulation just to proceed with surgery.  I have sent an in basket message to her FNP as well to consider prescribing long-term Eliquis if indeed she does not show up to her follow-up appointments here.  Thank you for consulting [place] in the care of this patient.  Please do not hesitate to contact us with any additional questions or concerns.  HISTORY OF PRESENTING ILLNESS:  Jill Clarke 37 y.o. female is here because of thrombocytosis.  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  During her initial visit, she was quite upset and did not want to do anything with the medications we were going to prescribe.  Please refer to my initial consult back in February 2023 for full note.  She was then lost to follow-up, did not show up to her follow-up appointment.  She most recently was trying to have breast reduction surgery and we had paperwork to clear her for surgery.  We have requested that she come back for follow-up before we can give any kind of clearance for her surgery.  She is not taking her Eliquis.  In fact she is not taking any of her medications.  In the initial part of the conversation she was kind of threatening and told me that she knows what to do to get past [place] one way or the other to move forward with the surgery.  She then mellowed during later part of the conversation and tells me that today is the day she is going to change how she eats and how she can regulate the weight.  She is however not willing to consider Hydrea long-term.  She is agreeable to short-term anticoagulation.  She most recently went to the ED with some left-sided weakness and not feeling well and had some CT imaging which did not show any evidence of new stroke.  Rest of the pertinent 10 point ROS reviewed and negative   MEDICAL HISTORY:  Past Medical History:  Diagnosis Date   Asthma  uses inhaler prn   Diabetes mellitus without complication (HCC)    Gestational diabetes   Nausea and vomiting in pregnancy 12/28/2010   Sickle cell trait (HCC)    Stroke (HCC)    Unspecified high-risk pregnancy 01/27/2011    SURGICAL HISTORY: Past Surgical History:  Procedure Laterality Date   IR ANGIO INTRA EXTRACRAN SEL COM CAROTID INNOMINATE UNI R MOD SED  02/13/2020   IR ANGIO INTRA EXTRACRAN SEL COM CAROTID INNOMINATE UNI R MOD SED  02/15/2020   IR ANGIO INTRA  EXTRACRAN SEL INTERNAL CAROTID UNI L MOD SED  02/13/2020   IR ANGIO VERTEBRAL SEL VERTEBRAL UNI R MOD SED  02/13/2020   IR CT HEAD LTD  02/13/2020   IR CT HEAD LTD  02/15/2020   IR PTA VENOUS EXCEPT DIALYSIS CIRCUIT  02/15/2020   IR THROMBECT VENO MECH MOD SED  02/13/2020   IR THROMBECT VENO MECH REPT MOD SED  02/15/2020   RADIOLOGY WITH ANESTHESIA N/A 02/13/2020   Procedure: IR WITH ANESTHESIA;  Surgeon: Julieanne Cotton, MD;  Location: [facility] OR;  Service: Radiology;  Laterality: N/A;   RADIOLOGY WITH ANESTHESIA N/A 02/15/2020   Procedure: IR WITH ANESTHESIA;  Surgeon: Radiologist, Medication, MD;  Location: [facility] OR;  Service: Radiology;  Laterality: N/A;   TONSILLECTOMY     WISDOM TOOTH EXTRACTION      SOCIAL HISTORY: Social History   Socioeconomic History   Marital status: Single    Spouse name: Not on file   Number of children: 2   Years of education: Not on file   Highest education level: Not on file  Occupational History   Not on file  Tobacco Use   Smoking status: Former   Smokeless tobacco: Never  Substance and Sexual Activity   Alcohol use: No   Drug use: No   Sexual activity: Yes    Birth control/protection: None, Condom, Abstinence    Comment: h/o implant, depo  Other Topics Concern   Not on file  Social History Narrative   ** Merged History Encounter **       Lives with mother and her 2 kids Left Handed None    Social Determinants of Health   Financial Resource Strain: Not on file  Food Insecurity: Not on file  Transportation Needs: Not on file  Physical Activity: Not on file  Stress: Not on file  Social Connections: Not on file  Intimate Partner Violence: Not on file    FAMILY HISTORY: Family History  Problem Relation Age of Onset   Sickle cell trait Mother    Depression Mother    Asthma Father    Drug abuse Father        heroin abuse   Kidney disease Father        due to heroin abuse   Diabetes Maternal Grandmother    Sleep apnea Neg Hx    Snoring  Neg Hx     ALLERGIES:  has No Known Allergies.  MEDICATIONS:  Current Outpatient Medications  Medication Sig Dispense Refill   albuterol (VENTOLIN HFA) 108 (90 Base) MCG/ACT inhaler Inhale 2 puffs into the lungs every 6 (six) hours as needed for wheezing or shortness of breath. (Patient not taking: Reported on 04/06/2022) 18 g 2   apixaban (ELIQUIS) 5 MG TABS tablet Take 1 tablet (5 mg total) by mouth 2 (two) times daily. (Patient not taking: Reported on 04/06/2022) 60 tablet 2   aspirin EC 81 MG tablet Take 1 tablet (81 mg total) by mouth daily.  Swallow whole. (Patient not taking: Reported on 04/06/2022) 30 tablet 11   butalbital-acetaminophen-caffeine (FIORICET) 50-325-40 MG tablet Take 1 tablet by mouth every 6 (six) hours as needed for headache. (Patient not taking: Reported on 04/06/2022) 20 tablet 0   dicyclomine (BENTYL) 10 MG capsule Take 1 capsule (10 mg total) by mouth 3 (three) times daily before meals. (Patient not taking: Reported on 04/06/2022) 30 capsule 0   docusate sodium (COLACE) 100 MG capsule Take 1 capsule (100 mg total) by mouth 2 (two) times daily. (Patient not taking: Reported on 04/06/2022) 10 capsule 0   ferrous sulfate 325 (65 FE) MG EC tablet Take 1 tablet (325 mg total) by mouth in the morning and at bedtime. (Patient not taking: Reported on 04/06/2022) 60 tablet 3   metroNIDAZOLE (FLAGYL) 500 MG tablet Take 1 tablet (500 mg total) by mouth 2 (two) times daily. One po bid x 7 days (Patient not taking: Reported on 04/06/2022) 14 tablet 0   ondansetron (ZOFRAN) 4 MG tablet Take 1 tablet (4 mg total) by mouth every 8 (eight) hours as needed for nausea or vomiting. (Patient not taking: Reported on 04/06/2022) 20 tablet 0   ondansetron (ZOFRAN-ODT) 4 MG disintegrating tablet Take 1 tablet (4 mg total) by mouth every 8 (eight) hours as needed for nausea or vomiting. (Patient not taking: Reported on 04/06/2022) 20 tablet 0   pantoprazole (PROTONIX) 20 MG tablet Take 1 tablet (20 mg  total) by mouth daily. (Patient not taking: Reported on 04/06/2022) 30 tablet 0   Vitamin D, Ergocalciferol, (DRISDOL) 1.25 MG (50000 UNIT) CAPS capsule Take 1 capsule (50,000 Units total) by mouth every 7 (seven) days. (Patient not taking: Reported on 04/06/2022) 5 capsule 1   No current facility-administered medications for this visit.     PHYSICAL EXAMINATION: ECOG PERFORMANCE STATUS: 0 - Asymptomatic  Vitals:   05/31/22 1248  BP: 125/84  Pulse: 92  Resp: 18  Temp: (!) 97.4 F (36.3 C)  SpO2: 99%   Filed Weights   05/31/22 1248  Weight: 162 lb 1.6 oz (73.5 kg)   Physical Exam Constitutional:      Appearance: Normal appearance.  Cardiovascular:     Rate and Rhythm: Normal rate and regular rhythm.     Pulses: Normal pulses.     Heart sounds: Normal heart sounds.  Pulmonary:     Effort: Pulmonary effort is normal.     Breath sounds: Normal breath sounds.  Musculoskeletal:        General: No swelling. Normal range of motion.     Cervical back: Normal range of motion. No rigidity.  Lymphadenopathy:     Cervical: No cervical adenopathy.  Skin:    Coloration: Skin is not jaundiced.  Neurological:     General: No focal deficit present.     Mental Status: She is alert.      LABORATORY DATA:  I have reviewed the data as listed Lab Results  Component Value Date   WBC 10.4 05/20/2022   HGB 12.2 05/20/2022   HCT 36.0 05/20/2022   MCV 93.8 05/20/2022   PLT 720 (H) 05/20/2022     Chemistry      Component Value Date/Time   NA 141 05/20/2022 0056   NA 140 07/25/2021 1250   K 3.6 05/20/2022 0056   CL 103 05/20/2022 0056   CO2 22 05/20/2022 0050   BUN 14 05/20/2022 0056   BUN 10 07/25/2021 1250   CREATININE 0.60 05/20/2022 0056   CREATININE 0.44 (L)  01/30/2011 1147      Component Value Date/Time   CALCIUM 8.8 (L) 05/20/2022 0050   ALKPHOS 70 05/20/2022 0050   AST 13 (L) 05/20/2022 0050   ALT 12 05/20/2022 0050   BILITOT 0.5 05/20/2022 0050   BILITOT 0.4  07/25/2021 1250     Labs show JAK2 [id number] mutation. CBC showed elevated platelet count at least for the past 1 year.  Normal WBC count on the most recent labs.  Normocytic normochromic anemia  RADIOGRAPHIC STUDIES: I have personally reviewed the radiological images as listed and agreed with the findings in the report.  CT HEAD CODE STROKE WO CONTRAST  Result Date: 05/20/2022 CLINICAL DATA:  Code stroke. EXAM: CT HEAD WITHOUT CONTRAST TECHNIQUE: Contiguous axial images were obtained from the base of the skull through the vertex without intravenous contrast. RADIATION DOSE REDUCTION: This exam was performed according to the departmental dose-optimization program which includes automated exposure control, adjustment of the mA and/or kV according to patient size and/or use of iterative reconstruction technique. COMPARISON:  Prior study from 10/26/2021. FINDINGS: Brain: Cerebral volume within normal limits. Chronic infarct at the left parietal convexity present. Few additional chronic lacunar infarcts about the subcortical frontal lobes. Minor chronic microvascular ischemic disease. No acute intracranial hemorrhage. No acute large vessel territory infarct. No mass lesion, mass effect or midline shift. No hydrocephalus or extra-axial fluid collection. Vascular: No abnormal hyperdense vessel. Skull: Scalp soft tissues demonstrate no acute finding. Calvarium intact. Sinuses/Orbits: Globes and orbital soft tissues within normal limits. Paranasal sinuses are clear. No mastoid effusion. Other: None. ASPECTS [facility] Stroke Program Early CT Score) - Ganglionic level infarction (caudate, lentiform nuclei, internal capsule, insula, M1-M3 cortex): 7 - Supraganglionic infarction (M4-M6 cortex): The Total score (0-10 with 10 being normal): 10 IMPRESSION: 1. No acute intracranial abnormality. 2. ASPECTS is 10. 3. Chronic cortical infarct at the left parietal lobe, with a few additional chronic lacunar infarcts at the  anterior frontal lobes. Underlying mild chronic microvascular ischemic disease. Overall, appearance is relatively stable as compared to MRI from 10/26/2021. Results were called by telephone at the time of interpretation on 05/20/2022 at 1:05 am to provider [facility] , who verbally acknowledged these results. Electronically Signed   By: Rise Mu M.D.   On: 05/20/2022 01:08    All questions were answered. The patient knows to call the clinic with any problems, questions or concerns. I spent 30 minutes in the care of this patient including H and P, review of records, counseling and coordination of care.  I once again tried to explain the risk of DVT/PE, cardiovascular event such as heart attack strokes with the JAK2 essential thrombocytosis and the importance of Hydrea as well as anticoagulation long-term.    Rachel Moulds, MD 05/31/2022 12:57 PM

## 2022-06-02 ENCOUNTER — Other Ambulatory Visit: Payer: Self-pay | Admitting: Family Medicine

## 2022-06-07 ENCOUNTER — Ambulatory Visit: Payer: Medicaid Other | Admitting: Plastic Surgery

## 2022-06-08 ENCOUNTER — Ambulatory Visit (INDEPENDENT_AMBULATORY_CARE_PROVIDER_SITE_OTHER): Payer: Medicaid Other | Admitting: Plastic Surgery

## 2022-06-08 ENCOUNTER — Encounter: Payer: Self-pay | Admitting: Plastic Surgery

## 2022-06-08 VITALS — BP 125/79 | HR 103 | Ht 60.0 in | Wt 165.8 lb

## 2022-06-08 DIAGNOSIS — N62 Hypertrophy of breast: Secondary | ICD-10-CM

## 2022-06-08 NOTE — Progress Notes (Signed)
Ms. Crissman returns today for discussion about her breast reduction.  Her hematologist is recommended perioperative Lovenox for her breast reduction due to her thrombocytosis.  I explained to Ms. Calistro that I was not comfortable with full Lovenox anticoagulation at the breast reduction and would cancel her surgery.  She, understandably, was not happy with this.  She will return to speak with her hematologist regarding her need for any type of medical intervention.  I do not believe this will change my opinion regarding her eligibility for surgery.  I have offered to refer her to one of the [facility] medical centers for evaluation.  She has declined at this time.  She did make with a comments about cutting her breasts so that she would have to have a breast reduction but I do not believe based on my conversation with her that this was a serious threat.  I will have her reevaluate her after her next hematology appointment.

## 2022-06-09 ENCOUNTER — Other Ambulatory Visit: Payer: Self-pay | Admitting: *Deleted

## 2022-06-27 ENCOUNTER — Inpatient Hospital Stay: Payer: Medicaid Other | Attending: Hematology and Oncology | Admitting: Adult Health

## 2022-06-27 ENCOUNTER — Telehealth: Payer: Self-pay | Admitting: *Deleted

## 2022-06-27 VITALS — BP 105/57 | HR 104 | Temp 99.0°F | Resp 18 | Wt 168.1 lb

## 2022-06-27 DIAGNOSIS — Z814 Family history of other substance abuse and dependence: Secondary | ICD-10-CM | POA: Insufficient documentation

## 2022-06-27 DIAGNOSIS — Z832 Family history of diseases of the blood and blood-forming organs and certain disorders involving the immune mechanism: Secondary | ICD-10-CM | POA: Insufficient documentation

## 2022-06-27 DIAGNOSIS — Z79899 Other long term (current) drug therapy: Secondary | ICD-10-CM | POA: Diagnosis not present

## 2022-06-27 DIAGNOSIS — Z8673 Personal history of transient ischemic attack (TIA), and cerebral infarction without residual deficits: Secondary | ICD-10-CM | POA: Diagnosis not present

## 2022-06-27 DIAGNOSIS — Z825 Family history of asthma and other chronic lower respiratory diseases: Secondary | ICD-10-CM | POA: Diagnosis not present

## 2022-06-27 DIAGNOSIS — R140 Abdominal distension (gaseous): Secondary | ICD-10-CM | POA: Diagnosis not present

## 2022-06-27 DIAGNOSIS — Z818 Family history of other mental and behavioral disorders: Secondary | ICD-10-CM | POA: Diagnosis not present

## 2022-06-27 DIAGNOSIS — Z841 Family history of disorders of kidney and ureter: Secondary | ICD-10-CM | POA: Insufficient documentation

## 2022-06-27 DIAGNOSIS — Z833 Family history of diabetes mellitus: Secondary | ICD-10-CM | POA: Diagnosis not present

## 2022-06-27 DIAGNOSIS — D473 Essential (hemorrhagic) thrombocythemia: Secondary | ICD-10-CM | POA: Insufficient documentation

## 2022-06-27 NOTE — Patient Instructions (Signed)
Essential Thrombocythemia  Essential thrombocythemia is a condition in which a person has too many platelets (thrombocytes) in the blood. Platelets are parts of blood that stick together and form a clot (thrombus) to help the body stop bleeding after an injury. This condition may also be called primary or essential thrombocytosis. Essential thrombocythemia happens when abnormal cells in the bone marrow (megakaryocytes) make too many platelets. What are the causes? The cause of this condition is not known. What are the signs or symptoms? This condition may not cause any symptoms. If you have symptoms, they may include: Blood clots. Weakness. Headache. Itching. Sweating or fever. Dizziness or confusion. Tingling or burning in your hands or feet. Symptoms may also include bleeding and an enlarged spleen. How is this diagnosed? This condition may be diagnosed based on: A physical exam. Your symptoms. Your medical history. Blood tests. A procedure to collect a sample of your bone marrow (bone marrow aspiration) for testing. How is this treated? If you do not have symptoms, you may not need treatment. Your health care provider may monitor your condition with regular blood tests. If you have symptoms, or if your platelet count is very high, you may be treated with: Aspirin or other medicines to thin your blood and help prevent blood clots. Medicines to reduce the number of platelets in your blood. A procedure to remove some platelets from your blood (plateletpheresis). During this procedure: Your health care provider will place an IV into one of your veins. The IV will be used to draw your blood into a machine that separates out the extra platelets. The blood with reduced platelets will be returned to your body. Follow these instructions at home: Medicines Take over-the-counter and prescription medicines only as told by your health care provider. If you are taking blood thinners: Talk  with your health care provider before you take any medicines that contain aspirin or NSAIDs, such as ibuprofen. These medicines increase your risk for dangerous bleeding. Take your medicine exactly as told, at the same time every day. Avoid activities that could cause injury or bruising, and follow instructions about how to prevent falls. Wear a medical alert bracelet or carry a card that lists what medicines you take. Tell all health care providers, including dental health care providers, about any medicines you are taking to prevent blood clots. General instructions Do not use any products that contain nicotine or tobacco. These products include cigarettes, chewing tobacco, and vaping devices, such as e-cigarettes. If you need help quitting, ask your health care provider. Ask your health care provider about managing or preventing high cholesterol, high blood pressure, and diabetes. These conditions can make essential thrombocythemia worse. Keep all follow-up visits. This is important. Contact a health care provider if: You have severe pain, and medicines do not help. You have problems taking your medicines to prevent blood clots. You have new pain, warmth, swelling, or redness in an arm or leg. This could mean you have a blood clot. You faint. You have unusual bruises. You have bloody or tarry stools. You have pink or bloody urine. Your menstrual periods are heavier than normal, if applicable. You have nosebleeds, bleeding gums, or other bleeding. Get help right away if:  You have chest pain. You have trouble breathing. You have any symptoms of a stroke. "BE FAST" is an easy way to remember the main warning signs of a stroke: B - Balance. Signs are dizziness, sudden trouble walking, or loss of balance. E - Eyes. Signs are  trouble seeing or a sudden change in vision. F - Face. Signs are sudden weakness or numbness of the face, or the face or eyelid drooping on one side. A - Arm. Signs are  weakness or numbness in an arm. This happens suddenly and usually on one side of the body. S - Speech. Signs are sudden trouble speaking, slurred speech, or trouble understanding what people say. T - Time. Time to call emergency services. Write down what time symptoms started. You have other signs of a stroke, such as: A sudden, severe headache with no known cause. Nausea or vomiting. Seizure. These symptoms may represent a serious problem that is an emergency. Do not wait to see if the symptoms will go away. Get medical help right away. Call your local emergency services (911 in the [place] Do not drive yourself to the hospital. Summary Essential thrombocythemia happens when abnormal cells in the bone marrow make too many platelets. If you have symptoms, or if your platelet count is very high, you may need treatment. Treatment can vary and may include medicines to thin the blood and prevent blood clots. Ask your health care provider about how to manage or prevent high cholesterol, high blood pressure, and diabetes. All of these can make essential thrombocythemia worse. Get help right away if you have any symptoms of stroke. This information is not intended to replace advice given to you by your health care provider. Make sure you discuss any questions you have with your health care provider. Document Revised: 07/12/2020 Document Reviewed: 07/12/2020 Elsevier Patient Education  2024 [employer].

## 2022-06-27 NOTE — Telephone Encounter (Signed)
This RN spoke with pt post pt declining to proceed with recommended therapy for known hx of blood clot with known lab proven clotting disorder at high risk for further incident that could impair or take her life.  This RN inquired if she has any additional questions or concerns per appt with LCC/NP.  Note pt verbally was adamant in stating "why do you want to prophecy into my life something that could happen but has not happened since I was discharged and have not been on medication".  This RN informed goal of this office is per medical review of her history and known abnormal labs - medically speaking she is at a higher risk for clot formation.  Our goal is to help her make an informed decision on the facts that we have- and that the decision is ultimately hers.  Patient again stated she did not like being told that something could happen that has not happened and having that thought in her head. She stated she feels God is taking care of her and does not like to have things said that could happen since God has been taking care of her without use of medications.  This RN asked if the patient felt she would like to speak to another MD about her diagnosis with pt replying "why- aren't they going to tell me to go on blood thinners too"  This RN discussed issue also is related to her wanting to pursue breast reduction surgery with pt stating "that issue has passed and we are not doing that anymore"  This RN informed her likely yes- but wanted to verify her issues were not related to the persons she was seeing.  She again reiterated that she does not like having things spoke into her future that have not happened.  She did state that only God and her mother Claris Che can tell her what to do.  Note pt was direct in her speech but not rude or nor threatening in her words or behavior with this RN.  This RN was accompanied by a 2nd RN- and kept the door open during the discussion.  This RN informed  her that if God or her mother talk with her and she has further questions to contact this office.  This RN with second RN showed pt exit with conversation ending directly but without confrontation.

## 2022-06-27 NOTE — Assessment & Plan Note (Addendum)
Jill Clarke is a 37 year old woman here today for follow-up of her essential thrombocythemia.  She has not been taking Eliquis as prescribed by Jill Clarke.  I reviewed with Jill Clarke her blood cells.  I shared that we have three main types of blood cells made from bone marrow.   White blood cells: These are immune cells.  Jill Clarke's are normal in number and appearance. Red blood cells: These are oxygen-carrying cells.  Jill Clarke's are slightly decreased to normal in number and normal in appearance. Platelets: These are clotting cells.  Jill Clarke's are increased in number, normal in appearance.   I then reviewed that Jill Clarke underwent JAK2 testing.  I shared that JAK2 is an acquired mutation that leads to overproduction of platelets in her bone marrow.  Due to this mutation, her platelets will always be elevated.    I reviewed with Jill Clarke that her elevated platelet count left uncontrolled can lead to additional complications with thrombosis.  Her additional risk factors for thrombosis include: BMI of 32.83, and her history of having a previous dural venous sinus thrombosis. I also reviewed that surgery also increases the risk of thrombosis.   Our goal in recommending treatment is to prevent this complication which can be dangerous, leading to heart attack or stroke, which can ultimately take her life which we do not want, especially coupled with surgery.  She said, "do you understand I can't have surgery because of y'all." I recommended that she consider seeing a different plastic surgeon who might be comfortable where Jill Clarke was not.    At this point in the visit, Jill Clarke's tone became aggressive and she asked me if I recommended a blood thinner.  I told her that I aligned with the recommendation in thinking for her safety.  She then began to question me in a condescending manner asking me why I think I know more than her.  I shared that may training has given me expertise on the topic and that  I want to provide recommendations that are best for her health.  She then was dismissive and stated that she knows her body and she doesn't need blood thinners if she doesn't have a clot.  She also demanded I share with her the blood type, which I did and it was O positive.  She said, "That tells [place] all we need to know".   I shared with Jill Clarke that I was uncomfortable with our exchange, and how it felt hostile.  She said she felt in danger because of the recommendations we gave her.  I let her know that I would get Jill Clarke and I left the room.    After reaching out to Jill Clarke to see the patient, Jill Clarke stated she did not feel safe seeing the patient either, due to previous interactions.  She recommended that the patient see another hematologist.  Mena Pauls RN spoke with patient (see her note) and then she left.   No f/u scheduled or recommended at this time.

## 2022-06-27 NOTE — Progress Notes (Signed)
[facility] Cancer Follow up:    Gilmore Laroche, FNP 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   Consulted with Dr. Al Pimple on 02/25/2021 for concern of thrombocytosis her platelet count was 721 on 06/20/2020 and 805 on 02/07/2021 Jak2 testing sent on 02/25/2021 and positive indicating Essential Thrombocythemia Met with Dr. Al Pimple on 03/17/2021 to review recommendations including Hydrea to reduce platelet production, or Eliquis to reduce her risk of VTE.  Patient educated in detail about her increased risk of VTE and how it can be fatal.  Patient opted to forego oral anticoagulation or Hydrea.    CURRENT THERAPY: None  INTERVAL HISTORY: Jill Clarke 37 y.o. female returns for f/u of her essential thrombocythemia.  She tells me that she is upset today because she was unable to have her breast reduction because of Dr. Remonia Richter recommendation for anticoagulation which she tells me she isn't going to do.  Takema stated that she got the impression from Dr. Al Pimple that she was going to make sure she wasn't able to undergo surgery because she was the only clinician who had a problem with it.     Patient Active Problem List   Diagnosis Date Noted   Essential thrombocythemia (HCC) 06/27/2022   Encounter for routine adult physical exam with abnormal findings 03/07/2022   Abdominal bloating with cramps 01/19/2022   Left-sided headache 11/04/2021   Cervical cancer screening 08/15/2021   Frequent headaches 07/25/2021   Gastroesophageal reflux disease 04/15/2020   Neurologic neglect syndrome    Hypotension    Cerebral vein thrombosis 02/28/2020   Hypokalemia 02/28/2020   Mastoiditis    Acute blood loss anemia    Seizures (HCC)    Cerebral thrombosis with cerebral infarction  02/13/2020   Middle cerebral artery embolism, left 02/13/2020   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  MEDICAL HISTORY: Past Medical History:  Diagnosis Date   Asthma    uses inhaler prn   Diabetes mellitus without complication (HCC)    Gestational diabetes   Nausea and vomiting in pregnancy 12/28/2010   Sickle cell trait (HCC)    Stroke (HCC)    Unspecified high-risk pregnancy 01/27/2011    SURGICAL HISTORY: Past Surgical History:  Procedure Laterality Date   IR ANGIO INTRA EXTRACRAN SEL COM CAROTID INNOMINATE UNI R MOD SED  02/13/2020   IR ANGIO INTRA EXTRACRAN SEL COM CAROTID INNOMINATE UNI R MOD SED  02/15/2020   IR ANGIO INTRA EXTRACRAN SEL INTERNAL CAROTID UNI L MOD SED  02/13/2020   IR ANGIO VERTEBRAL SEL VERTEBRAL UNI R MOD SED  02/13/2020   IR CT HEAD LTD  02/13/2020   IR CT HEAD LTD  02/15/2020   IR PTA VENOUS EXCEPT DIALYSIS CIRCUIT  02/15/2020   IR THROMBECT VENO MECH MOD SED  02/13/2020   IR THROMBECT VENO MECH REPT MOD SED  02/15/2020   RADIOLOGY WITH ANESTHESIA N/A 02/13/2020   Procedure: IR WITH ANESTHESIA;  Surgeon: Julieanne Cotton,  MD;  Location: [facility] OR;  Service: Radiology;  Laterality: N/A;   RADIOLOGY WITH ANESTHESIA N/A 02/15/2020   Procedure: IR WITH ANESTHESIA;  Surgeon: Radiologist, Medication, MD;  Location: [facility] OR;  Service: Radiology;  Laterality: N/A;   TONSILLECTOMY     WISDOM TOOTH EXTRACTION      SOCIAL HISTORY: Social History   Socioeconomic History   Marital status: Single    Spouse name: Not on file   Number of children: 2   Years of education: Not on file    Highest education level: Not on file  Occupational History   Not on file  Tobacco Use   Smoking status: Former   Smokeless tobacco: Never  Substance and Sexual Activity   Alcohol use: No   Drug use: No   Sexual activity: Yes    Birth control/protection: None, Condom, Abstinence    Comment: h/o implant, depo  Other Topics Concern   Not on file  Social History Narrative   ** Merged History Encounter **       Lives with mother and her 2 kids Left Handed None    Social Determinants of Health   Financial Resource Strain: Not on file  Food Insecurity: Not on file  Transportation Needs: Not on file  Physical Activity: Not on file  Stress: Not on file  Social Connections: Not on file  Intimate Partner Violence: Not on file    FAMILY HISTORY: Family History  Problem Relation Age of Onset   Sickle cell trait Mother    Depression Mother    Asthma Father    Drug abuse Father        heroin abuse   Kidney disease Father        due to heroin abuse   Diabetes Maternal Grandmother    Sleep apnea Neg Hx    Snoring Neg Hx        PHYSICAL EXAMINATION   Onc Performance Status - 06/27/22 1405       ECOG Perf Status   ECOG Perf Status Fully active, able to carry on all pre-disease performance without restriction      KPS SCALE   KPS % SCORE Normal, no compliants, no evidence of disease             Vitals:   06/27/22 1404  BP: (!) 105/57  Pulse: (!) 104  Resp: 18  Temp: 99 F (37.2 C)  SpO2: 100%    Deferred   LABORATORY DATA:  CBC    Component Value Date/Time   WBC 10.4 05/20/2022 0050   RBC 3.73 (L) 05/20/2022 0050   HGB 12.2 05/20/2022 0056   HGB 11.8 07/25/2021 1250   HCT 36.0 05/20/2022 0056   HCT 35.2 07/25/2021 1250   PLT 720 (H) 05/20/2022 0050   PLT 793 (H) 07/25/2021 1250   MCV 93.8 05/20/2022 0050   MCV 91 07/25/2021 1250   MCH 30.0 05/20/2022 0050   MCHC 32.0 05/20/2022 0050   RDW 13.2 05/20/2022 0050   RDW 12.3 07/25/2021 1250    LYMPHSABS 3.5 05/20/2022 0050   LYMPHSABS 2.3 07/25/2021 1250   MONOABS 1.0 05/20/2022 0050   EOSABS 0.3 05/20/2022 0050   EOSABS 0.3 07/25/2021 1250   BASOSABS 0.1 05/20/2022 0050   BASOSABS 0.0 07/25/2021 1250    CMP     Component Value Date/Time   NA 141 05/20/2022 0056   NA 140 07/25/2021 1250   K 3.6 05/20/2022 0056   CL 103 05/20/2022 0056  CO2 22 05/20/2022 0050   GLUCOSE 93 05/20/2022 0056   BUN 14 05/20/2022 0056   BUN 10 07/25/2021 1250   CREATININE 0.60 05/20/2022 0056   CREATININE 0.44 (L) 01/30/2011 1147   CALCIUM 8.8 (L) 05/20/2022 0050   PROT 7.2 05/20/2022 0050   PROT 7.2 07/25/2021 1250   ALBUMIN 3.8 05/20/2022 0050   ALBUMIN 4.2 07/25/2021 1250   AST 13 (L) 05/20/2022 0050   ALT 12 05/20/2022 0050   ALKPHOS 70 05/20/2022 0050   BILITOT 0.5 05/20/2022 0050   BILITOT 0.4 07/25/2021 1250   GFRNONAA >60 05/20/2022 0050   GFRAA 60 (L) 12/01/2016 1806       ASSESSMENT and THERAPY PLAN:   Essential thrombocythemia (HCC) Khepri is a 37 year old woman here today for follow-up of her essential thrombocythemia.  She has not been taking Eliquis as prescribed by Dr. Al Pimple.  I reviewed with Kristy-Lee her blood cells.  I shared that we have three main types of blood cells made from bone marrow.   White blood cells: These are immune cells.  Shaylynne's are normal in number and appearance. Red blood cells: These are oxygen-carrying cells.  Nautica's are slightly decreased to normal in number and normal in appearance. Platelets: These are clotting cells.  Sarabelle's are increased in number, normal in appearance.   I then reviewed that Shyah underwent JAK2 testing.  I shared that JAK2 is an acquired mutation that leads to overproduction of platelets in her bone marrow.  Due to this mutation, her platelets will always be elevated.    I reviewed with Akiva that her elevated platelet count left uncontrolled can lead to additional complications with  thrombosis.  Her additional risk factors for thrombosis include: BMI of 32.83, and her history of having a previous dural venous sinus thrombosis. I also reviewed that surgery also increases the risk of thrombosis.   Our goal in recommending treatment is to prevent this complication which can be dangerous, leading to heart attack or stroke, which can ultimately take her life which we do not want, especially coupled with surgery.  She said, "do you understand I can't have surgery because of y'all." I recommended that she consider seeing a different plastic surgeon who might be comfortable where Dr. Ladona Ridgel was not.    At this point in the visit, Margree's tone became aggressive and she asked me if I recommended a blood thinner.  I told her that I aligned with the recommendation in thinking for her safety.  She then began to question me in a condescending manner asking me why I think I know more than her.  I shared that may training has given me expertise on the topic and that I want to provide recommendations that are best for her health.  She then was dismissive and stated that she knows her body and she doesn't need blood thinners if she doesn't have a clot.  She also demanded I share with her the blood type, which I did and it was O positive.  She said, "That tells [place] all we need to know".   I shared with Areion that I was uncomfortable with our exchange, and how it felt hostile.  She said she felt in danger because of the recommendations we gave her.  I let her know that I would get Dr. Al Pimple and I left the room.    After reaching out to Dr. Al Pimple to see the patient, Dr. Al Pimple stated she did not feel safe seeing the patient  either, due to previous interactions.  She recommended that the patient see another hematologist.  Mena Pauls RN spoke with patient (see her note) and then she left.   No f/u scheduled or recommended at this time.     All questions were answered. The patient knows to call the  clinic with any problems, questions or concerns. We can certainly see the patient much sooner if necessary.  Total encounter time:60 minutes*in face-to-face visit time, chart review, lab review, care coordination, order entry, and documentation of the encounter time.  Lillard Anes, NP 06/27/22 3:41 PM Medical Oncology and Hematology [facility] [address] [place], [place] [address] Tel. [phone]    Fax. [phone]  *Total Encounter Time as defined by the Centers for Medicare and Medicaid Services includes, in addition to the face-to-face time of a patient visit (documented in the note above) non-face-to-face time: obtaining and reviewing outside history, ordering and reviewing medications, tests or procedures, care coordination (communications with other health care professionals or caregivers) and documentation in the medical record.

## 2022-07-12 ENCOUNTER — Ambulatory Visit: Payer: Medicaid Other | Admitting: Physician Assistant

## 2023-01-29 ENCOUNTER — Telehealth (INDEPENDENT_AMBULATORY_CARE_PROVIDER_SITE_OTHER): Payer: Medicaid Other | Admitting: Family Medicine

## 2023-01-29 ENCOUNTER — Other Ambulatory Visit (HOSPITAL_COMMUNITY): Payer: Self-pay

## 2023-01-29 ENCOUNTER — Encounter: Payer: Self-pay | Admitting: Family Medicine

## 2023-01-29 VITALS — Ht 62.0 in | Wt 171.0 lb

## 2023-01-29 DIAGNOSIS — N62 Hypertrophy of breast: Secondary | ICD-10-CM | POA: Diagnosis not present

## 2023-01-29 DIAGNOSIS — R140 Abdominal distension (gaseous): Secondary | ICD-10-CM | POA: Diagnosis not present

## 2023-01-29 DIAGNOSIS — R109 Unspecified abdominal pain: Secondary | ICD-10-CM

## 2023-01-29 MED ORDER — DICYCLOMINE HCL 10 MG PO CAPS
10.0000 mg | ORAL_CAPSULE | Freq: Three times a day (TID) | ORAL | 1 refills | Status: DC
Start: 2023-01-29 — End: 2023-03-27
  Filled 2023-01-29: qty 30, 10d supply, fill #0

## 2023-01-29 NOTE — Progress Notes (Signed)
 Virtual Visit via Video Note  I connected with Jill Clarke on 01/29/23 at  3:20 PM EST by a video enabled telemedicine application and verified that I am speaking with the correct person using two identifiers.  Patient Location: Home Provider Location: Office/Clinic  I discussed the limitations, risks, security, and privacy concerns of performing an evaluation and management service by video and the availability of in person appointments. I also discussed with the patient that there may be a patient responsible charge related to this service. The patient expressed understanding and agreed to proceed.  Subjective: PCP: Bryton Romagnoli, FNP  Chief Complaint  Patient presents with   Bloated    Pt reports sx of feeling bloated and feels like she is holding weight since last visit.    HPI The patient complains of abdominal bloating recurrence. She was last seen for this complaint on 01/19/2022, and a referral was placed to [facility] but the patient reports that she did not hear from the referral office and therefore denies follow-up. She also reports not taking dicyclomine  10 mg as prescribed because the medication could not be picked up at the pharmacy it was sent to. The patient complains of abdominal bloating after eating and denies symptoms such as fever, blood in her stools, unexplained weight loss, vomiting, or nausea. She has requested a second opinion and would like another referral to surgery.    ROS: Per HPI  Current Outpatient Medications:    albuterol  (VENTOLIN  HFA) 108 (90 Base) MCG/ACT inhaler, Inhale 2 puffs into the lungs every 6 (six) hours as needed for wheezing or shortness of breath., Disp: 18 g, Rfl: 2   apixaban  (ELIQUIS ) 5 MG TABS tablet, Take 1 tablet (5 mg total) by mouth 2 (two) times daily., Disp: 60 tablet, Rfl: 2   aspirin  EC 81 MG tablet, Take 1 tablet (81 mg total) by mouth daily. Swallow whole., Disp: 30 tablet, Rfl: 11   ferrous sulfate  325 (65 FE) MG EC  tablet, Take 1 tablet (325 mg total) by mouth in the morning and at bedtime., Disp: 60 tablet, Rfl: 3   pantoprazole  (PROTONIX ) 20 MG tablet, Take 1 tablet (20 mg total) by mouth daily., Disp: 30 tablet, Rfl: 0   Vitamin D , Ergocalciferol , (DRISDOL ) 1.25 MG (50000 UNIT) CAPS capsule, Take 1 capsule (50,000 Units total) by mouth every 7 (seven) days., Disp: 5 capsule, Rfl: 1   dicyclomine  (BENTYL ) 10 MG capsule, Take 1 capsule (10 mg total) by mouth 3 (three) times daily before meals., Disp: 30 capsule, Rfl: 1  Observations/Objective: Today's Vitals   01/29/23 1515  Weight: 171 lb (77.6 kg)  Height: 5' 2 (1.575 m)   Physical Exam  Assessment and Plan: Abdominal bloating with cramps -     Ambulatory referral to Gastroenterology -     Dicyclomine  HCl; Take 1 capsule (10 mg total) by mouth 3 (three) times daily before meals.  Dispense: 30 capsule; Refill: 1  Macromastia -     Ambulatory referral to Plastic Surgery  Please avoid foods that are  Fermentable Oligosaccharides: Wheat, barley, rye, onion, leek, white part of spring onion, garlic, shallots, artichokes, beetroot, fennel, peas, chicory, pistachio, cashews, legumes, lentils, and chickpeas Disaccharides:Lactose, Milk, custard, ice cream, and yogurt Monosaccharides: Apples, pears, mangoes, cherries, watermelon, asparagus, sugar snap peas, honey, high-fructose corn syrup Polysols:Apples, pears, apricots, cherries, nectarines, peaches, plums, watermelon, mushrooms, cauliflower, artificially sweetened chewing gum and confectionery.   Follow Up Instructions: No follow-ups on file.   I discussed the  assessment and treatment plan with the patient. The patient was provided an opportunity to ask questions, and all were answered. The patient agreed with the plan and demonstrated an understanding of the instructions.   The patient was advised to call back or seek an in-person evaluation if the symptoms worsen or if the condition fails to  improve as anticipated.  The above assessment and management plan was discussed with the patient. The patient verbalized understanding of and has agreed to the management plan.   Liam Cammarata, FNP

## 2023-02-08 ENCOUNTER — Other Ambulatory Visit (HOSPITAL_COMMUNITY): Payer: Self-pay

## 2023-02-21 ENCOUNTER — Institutional Professional Consult (permissible substitution): Payer: Medicaid Other | Admitting: Plastic Surgery

## 2023-03-07 ENCOUNTER — Encounter (INDEPENDENT_AMBULATORY_CARE_PROVIDER_SITE_OTHER): Payer: Self-pay | Admitting: *Deleted

## 2023-03-08 ENCOUNTER — Ambulatory Visit: Payer: Medicaid Other | Admitting: Gastroenterology

## 2023-03-15 ENCOUNTER — Ambulatory Visit: Payer: Medicaid Other | Admitting: Gastroenterology

## 2023-03-22 ENCOUNTER — Ambulatory Visit: Payer: Medicaid Other | Admitting: Plastic Surgery

## 2023-03-22 VITALS — BP 110/78 | HR 121

## 2023-03-22 DIAGNOSIS — N62 Hypertrophy of breast: Secondary | ICD-10-CM | POA: Diagnosis not present

## 2023-03-22 DIAGNOSIS — Z719 Counseling, unspecified: Secondary | ICD-10-CM

## 2023-03-23 NOTE — Progress Notes (Signed)
 Jill Clarke returns today to discuss breast reduction.  I have seen her in the past and while she is certainly an acceptable candidate for a breast reduction from the standpoint of the size of her breast I have told her due to her ongoing platelet issues and her need for chronic anticoagulation that she is not a candidate for a breast reduction here.  I have strongly recommended that if she feels that she must have a breast reduction that she be [facility] the surgery.  She will speak to her primary care provider regarding referral to [facility], [facility] or [facility].  Of note when she saw me last time her insurance denied coverage for a breast reduction stating that this is not a covered benefit with her insurance.

## 2023-03-27 ENCOUNTER — Telehealth: Payer: Self-pay

## 2023-03-27 ENCOUNTER — Telehealth: Payer: Medicaid Other | Admitting: Gastroenterology

## 2023-03-27 ENCOUNTER — Other Ambulatory Visit (HOSPITAL_COMMUNITY): Payer: Self-pay

## 2023-03-27 ENCOUNTER — Encounter: Payer: Self-pay | Admitting: Gastroenterology

## 2023-03-27 VITALS — Ht 62.0 in | Wt 171.0 lb

## 2023-03-27 DIAGNOSIS — R140 Abdominal distension (gaseous): Secondary | ICD-10-CM | POA: Diagnosis not present

## 2023-03-27 DIAGNOSIS — K219 Gastro-esophageal reflux disease without esophagitis: Secondary | ICD-10-CM

## 2023-03-27 DIAGNOSIS — K625 Hemorrhage of anus and rectum: Secondary | ICD-10-CM

## 2023-03-27 DIAGNOSIS — D649 Anemia, unspecified: Secondary | ICD-10-CM

## 2023-03-27 DIAGNOSIS — K5900 Constipation, unspecified: Secondary | ICD-10-CM | POA: Diagnosis not present

## 2023-03-27 MED ORDER — OMEPRAZOLE 20 MG PO CPDR
20.0000 mg | DELAYED_RELEASE_CAPSULE | Freq: Every day | ORAL | 1 refills | Status: AC
  Filled 2023-03-27: qty 90, 90d supply, fill #0

## 2023-03-27 NOTE — Patient Instructions (Addendum)
 For reflux: Start omeprazole 20 mg once daily, 30 minutes prior to breakfast or on empty stomach. Follow a GERD diet:  Avoid fried, fatty, greasy, spicy, citrus foods. Avoid caffeine and carbonated beverages. Avoid chocolate. Try eating 4-6 small meals a day rather than 3 large meals. Do not eat within 3 hours of laying down. Prop head of bed up on wood or bricks to create a 6 inch incline.   For hemorrhoids/rectal bleeding; -Use Preparation H over-the-counter twice daily for 5 days -Try to limit straining, limit toilet time to no more than 5 minutes at a time.  For constipation: Take 1 Dulcolax daily for 3 days. After taking the Dulcolax, start MiraLAX 17 g (1 capful) once daily in 8 ounces of water Start daily fiber supplementation with Metamucil 1 tablespoon daily in 8 ounces of water Increase water intake High-fiber diet  For bloating: -Please have labs performed at [facility] -Avoid gas-producing foods (eg, garlic, cabbage, legumes, onions, broccoli, brussel sprouts, beans, wheat, and potatoes).   Follow-up in 2-3 weeks for rectal exam.  It was a pleasure to see you today. I want to create trusting relationships with patients. If you receive a survey regarding your visit,  I greatly appreciate you taking time to fill this out on paper or through your MyChart. I value your feedback.  Brooke Bonito, MSN, FNP-BC, AGACNP-BC [facility]

## 2023-03-27 NOTE — Telephone Encounter (Signed)
 Jill Clarke, you are scheduled for a virtual visit with your provider today.  Just as we do with appointments in the office, we must obtain your consent to participate.  Your consent will be active for this visit and any virtual visit you may have with one of our providers in the next 365 days.  If you have a MyChart account, I can also send a copy of this consent to you electronically.  All virtual visits are billed to your insurance company just like a traditional visit in the office.  As this is a virtual visit, video technology does not allow for your provider to perform a traditional examination.  This may limit your provider's ability to fully assess your condition.  If your provider identifies any concerns that need to be evaluated in person or the need to arrange testing such as labs, EKG, etc, we will make arrangements to do so.  Although advances in technology are sophisticated, we cannot ensure that it will always work on either your end or our end.  If the connection with a video visit is poor, we may have to switch to a telephone visit.  With either a video or telephone visit, we are not always able to ensure that we have a secure connection.   I need to obtain your verbal consent now.   Are you willing to proceed with your visit today? yes

## 2023-03-27 NOTE — Progress Notes (Signed)
 Primary Care Physician:  Jill Laroche, FNP  Primary Gastroenterologist: Jill Friends. Rourk, MD  Patient Location: Home Reason for Visit: new patient - boating and alternating bowel habits Provider Location:   Persons present on the virtual encounter, with roles: Patient - Jill Clarke; Provider - Jill Bonito, NP   Total time (minutes) spent on medical discussion: 18 minutes  Virtual Visit Encounter Note Visit is conducted virtually and was requested by patient.   I connected with Jill Clarke on 03/27/23 at  2:30 PM EST by video and verified that I am speaking with the correct person using two identifiers.   I discussed the limitations, risks, security and privacy concerns of performing an evaluation and management service by video and the availability of in person appointments. I also discussed with the patient that there may be a patient responsible charge related to this service. The patient expressed understanding and agreed to proceed.  Chief Complaint  Patient presents with   New Patient (Initial Visit)    Referred for trapped gas and bloating. Goes from diarrhea to constipation. Pt thinks she is backed up. Last BM this am (a little bit)    History of Present Illness: Jill Clarke is a 38 y.o. female with a history of asthma, gestational diabetes, stroke? Presenting today at the request of Jill Clarke for evaluation of abdominal bloating and alternating bowel habits.   Reported to PCP on 01/29/23 that she was feeling bloated and holding more weight than usual. Not taking dicyclomine as previously prescribed. Has bloating after eating and denied weight loss, N/V, melena, brbpr.   Today:  Has bene feeling constipated for a while but just been dealing with it. She was able to use the bathroom today but it takes a lot of effort. Bananas have been helping with her constipation. Feeling bloated all the time and holding lots of gas. She does admit to loving  onions and garlic and has always eaten it. Stools are soft but just not able to get it out. Does not fele like she can   Feels like she is bloated all the time and no release even if she drinks water - has been dealing with it.   Recently when she uses the bathroom she has seen some blood and that concerned her. Yesterday she had some blood and then today just a smear and felt like she felt a boil or something solid and at the opening and felt it on the outside.   To her it looks like a boil on her anus.   With her bloating she has also been having reflux as well. Barely eats meat. Does not eat red meat. Not able to eat salad.  In the past she ate a lot of fried foods but eats more baked and boiled foods now. Eats pasta and chicken.   She states she was supposed to be on iron but currently not taking it and has been taking the vitamin D.    Medications Current Meds  Medication Sig   ferrous sulfate 325 (65 FE) MG EC tablet Take 1 tablet (325 mg total) by mouth in the morning and at bedtime.   omeprazole (PRILOSEC) 20 MG capsule Take 1 capsule (20 mg total) by mouth daily.   Vitamin D, Ergocalciferol, (DRISDOL) 1.25 MG (50000 UNIT) CAPS capsule Take 1 capsule (50,000 Units total) by mouth every 7 (seven) days.     History Past Medical History:  Diagnosis Date   Asthma  uses inhaler prn   Diabetes mellitus without complication (HCC)    Gestational diabetes   Nausea and vomiting in pregnancy 12/28/2010   Sickle cell trait (HCC)    Stroke (HCC)    Unspecified high-risk pregnancy 01/27/2011    Past Surgical History:  Procedure Laterality Date   IR ANGIO INTRA EXTRACRAN SEL COM CAROTID INNOMINATE UNI R MOD SED  02/13/2020   IR ANGIO INTRA EXTRACRAN SEL COM CAROTID INNOMINATE UNI R MOD SED  02/15/2020   IR ANGIO INTRA EXTRACRAN SEL INTERNAL CAROTID UNI L MOD SED  02/13/2020   IR ANGIO VERTEBRAL SEL VERTEBRAL UNI R MOD SED  02/13/2020   IR CT HEAD LTD  02/13/2020   IR CT HEAD LTD   02/15/2020   IR PTA VENOUS EXCEPT DIALYSIS CIRCUIT  02/15/2020   IR THROMBECT VENO MECH MOD SED  02/13/2020   IR THROMBECT VENO MECH REPT MOD SED  02/15/2020   RADIOLOGY WITH ANESTHESIA N/A 02/13/2020   Procedure: IR WITH ANESTHESIA;  Surgeon: Julieanne Cotton, MD;  Location: [facility] OR;  Service: Radiology;  Laterality: N/A;   RADIOLOGY WITH ANESTHESIA N/A 02/15/2020   Procedure: IR WITH ANESTHESIA;  Surgeon: Radiologist, Medication, MD;  Location: [facility] OR;  Service: Radiology;  Laterality: N/A;   TONSILLECTOMY     WISDOM TOOTH EXTRACTION      Family History  Problem Relation Age of Onset   Sickle cell trait Mother    Depression Mother    Asthma Father    Drug abuse Father        heroin abuse   Kidney disease Father        due to heroin abuse   Diabetes Maternal Grandmother    Sleep apnea Neg Hx    Snoring Neg Hx     Social History   Socioeconomic History   Marital status: Single    Spouse name: Not on file   Number of children: 2   Years of education: Not on file   Highest education level: Not on file  Occupational History   Not on file  Tobacco Use   Smoking status: Former   Smokeless tobacco: Never  Substance and Sexual Activity   Alcohol use: No   Drug use: No   Sexual activity: Yes    Birth control/protection: None, Condom, Abstinence    Comment: h/o implant, depo  Other Topics Concern   Not on file  Social History Narrative   ** Merged History Encounter **       Lives with mother and her 2 kids Left Handed None    Social Drivers of Health   Financial Resource Strain: Not on file  Food Insecurity: Not on file  Transportation Needs: Not on file  Physical Activity: Not on file  Stress: Not on file  Social Connections: Unknown (06/04/2021)   Received from [facility], [facility]   Social Network    Social Network: Not on file      Review of Systems: Gen: Denies fever, chills, anorexia. Denies fatigue, weakness, weight loss.  CV: Denies chest pain,  palpitations, syncope, peripheral edema, and claudication. Resp: Denies dyspnea at rest, cough, wheezing, coughing up blood, and pleurisy. GI: see HPI Derm: Denies rash, itching, dry skin Psych: Denies depression, anxiety, memory loss, confusion. No homicidal or suicidal ideation.  Heme: Denies bruising, bleeding, and enlarged lymph nodes.  Observations/Objective: No distress. Alert and oriented. Pleasant. Well nourished. Normal mood and affect. Unable to perform complete physical exam due to video encounter.  Assessment:  Abdominal bloating: Constant feeling of like she is holding onto gas and feeling bloated all the time.  She does admit to eating lots of onions and garlic and has been doing this long-term without issue in the past.  Also admitted to feeling constipated more recently as noted below.  Sometimes even water makes her feel very full and bloated.  Has not take anything over-the-counter, just has been dealing with her symptoms.  Discussed dietary avoidance today.  Discussed bowel regimen today as noted below.  Can consider further workup if ongoing bloating despite better control of constipation and reflux.  As noted below, checking for celiac.  GERD: Reported some reflux symptoms along with her bloating.  Has stopped eating meats, does not eat red meat.  States she is not able to eat any roughage including salads.  In the past she ate lots of fried foods however more recently has been baking and boiling her foods, admits to lots of [place] and chicken.  Discussed continuing PPI for now, checking labs as noted below.  Constipation: Given reports of constipation with the need to strain, recommended increasing water intake and starting MiraLAX daily after 3 days of Dulcolax.  Recommended high-fiber diet and supplementation with daily fiber.  If ongoing issues despite MiraLAX, may need to consider evaluation for pelvic dyssynergia given stools are soft just having difficulty with  defecation.  Rectal bleeding, anemia: Has reported some intermittent blood in her stool that was concerning to her.  Day of visit she reported just a smear.  At times feeling there is a boil or something at the opening of her anal canal.  Reports anemia, was told to take iron supplementation.  Last hemoglobin 11.2 in April 2024.  No prior EGD or colonoscopy on file.  For now we will try Preparation H for hemorrhoids as well as recommended bowel regimen for constipation and continuing low-dose PPI.  Checking labs given some mild fatigue as well (CBC, CMP, TSH, celiac panel, and iron panel).  Pending lab results may need to discuss EGD and/or colonoscopy for evaluation of mild anemia and her rectal bleeding.  Recommended office visit for rectal exam.  Plan:  Omeprazole 20 mg daily. CBC, CMP, iron panel, TSH, celiac panel Avoid, gas producing foods GERD diet High-fiber diet, supplement with Metamucil once daily. Preparation H twice daily for 5 days Dulcolax once daily for 3 days then start MiraLAX 17 g  (1 capful) daily in 8 ounces of water. Increase daily water intake Follow-up in 2-3 weeks for rectal exam  Follow Up Instructions:  I discussed the assessment and treatment plan with the patient. The patient was provided an opportunity to ask questions and all were answered. The patient agreed with the plan and demonstrated an understanding of the instructions.   The patient was advised to call back or seek an in-person evaluation if the symptoms worsen or if the condition fails to improve as anticipated.    Jill Bonito, MSN, APRN, FNP-BC, AGACNP-BC [facility]

## 2023-03-28 ENCOUNTER — Encounter: Payer: Self-pay | Admitting: Family Medicine

## 2023-04-04 ENCOUNTER — Telehealth: Payer: Self-pay | Admitting: Gastroenterology

## 2023-04-04 NOTE — Telephone Encounter (Signed)
 Error

## 2023-04-06 ENCOUNTER — Other Ambulatory Visit (HOSPITAL_COMMUNITY): Payer: Self-pay

## 2023-04-07 ENCOUNTER — Other Ambulatory Visit: Payer: Self-pay | Admitting: Family Medicine

## 2023-04-07 DIAGNOSIS — Z111 Encounter for screening for respiratory tuberculosis: Secondary | ICD-10-CM

## 2023-04-07 NOTE — Telephone Encounter (Signed)
 Please schedule a nurse visit for the patient to receive the flu and Tdap vaccines. Orders have been placed for the QuantiFERON-TB Gold Plus test to screen for TB.

## 2023-04-09 ENCOUNTER — Ambulatory Visit

## 2023-04-09 DIAGNOSIS — Z23 Encounter for immunization: Secondary | ICD-10-CM | POA: Diagnosis not present

## 2023-04-09 NOTE — Telephone Encounter (Signed)
 Patient returned call would like to get flu shot today.  This is the only day due to her work schedule to get it done. Sent a teams message out asking if okay never heard back from any nurse. Patient scheduled for 03.17 at 2 pm.

## 2023-04-10 ENCOUNTER — Ambulatory Visit

## 2023-04-19 ENCOUNTER — Ambulatory Visit: Admitting: Gastroenterology

## 2023-04-19 ENCOUNTER — Encounter: Payer: Self-pay | Admitting: Gastroenterology

## 2023-04-19 NOTE — Progress Notes (Deleted)
 GI Office Note    Referring Provider: Gilmore Laroche, FNP Primary Care Physician:  Gilmore Laroche, FNP Primary Gastroenterologist: *** Date:  04/19/2023  ID:  [name], [name] [birth year], MRN [id number]   Chief Complaint   No chief complaint on file.   History of Present Illness  Jill Clarke is a 38 y.o. female with a history of asthma, gestational diabetes,?  Stroke, and constipation presenting today with complaint of ***  Reported to PCP on 01/29/23 that she was feeling bloated and holding more weight than usual. Not taking dicyclomine as previously prescribed. Has bloating after eating and denied weight loss, N/V, melena, brbpr.   Last office visit virtually 03/27/23.  Reported constant constipation.  Using a lot of effort/straining for bowel movements.  Bananas to help her.  Feeling bloated and holding lots of gas.  Admits to living garlic and onions as well.  Does not get any relief even with drinking water.  Has seen some BRBPR.  Reports feeling like a boil or something solid at the opening of her anal canal.  Also noted some reflux, barely eats any meat and does not eat red meat at all.  Recently more so eating baked and boiled foods.  Had not yet been taking iron but was taking vitamin D.  Advised omeprazole 20 mg once daily.  Check CBC, CMP, iron panel, TSH, celiac panel.  Advised a high-fiber diet and supplement with Metamucil.  Use Preparation H twice daily for 5 days for hemorrhoids.  Advise Dulcolax once daily for 3 days and then start MiraLAX daily.  She was advised to follow-up in the office for rectal exam    Today:    Wt Readings from Last 3 Encounters:  03/27/23 171 lb (77.6 kg)  01/29/23 171 lb (77.6 kg)  06/27/22 168 lb 1.6 oz (76.2 kg)    Current Outpatient Medications  Medication Sig Dispense Refill   albuterol (VENTOLIN HFA) 108 (90 Base) MCG/ACT inhaler Inhale 2 puffs into the lungs every 6 (six) hours as needed for wheezing or shortness of  breath. (Patient not taking: Reported on 03/27/2023) 18 g 2   ferrous sulfate 325 (65 FE) MG EC tablet Take 1 tablet (325 mg total) by mouth in the morning and at bedtime. 60 tablet 3   omeprazole (PRILOSEC) 20 MG capsule Take 1 capsule (20 mg total) by mouth daily. 90 capsule 1   Vitamin D, Ergocalciferol, (DRISDOL) 1.25 MG (50000 UNIT) CAPS capsule Take 1 capsule (50,000 Units total) by mouth every 7 (seven) days. 5 capsule 1   No current facility-administered medications for this visit.    Past Medical History:  Diagnosis Date   Asthma    uses inhaler prn   Diabetes mellitus without complication (HCC)    Gestational diabetes   Nausea and vomiting in pregnancy 12/28/2010   Sickle cell trait (HCC)    Stroke (HCC)    Unspecified high-risk pregnancy 01/27/2011    Past Surgical History:  Procedure Laterality Date   IR ANGIO INTRA EXTRACRAN SEL COM CAROTID INNOMINATE UNI R MOD SED  02/13/2020   IR ANGIO INTRA EXTRACRAN SEL COM CAROTID INNOMINATE UNI R MOD SED  02/15/2020   IR ANGIO INTRA EXTRACRAN SEL INTERNAL CAROTID UNI L MOD SED  02/13/2020   IR ANGIO VERTEBRAL SEL VERTEBRAL UNI R MOD SED  02/13/2020   IR CT HEAD LTD  02/13/2020   IR CT HEAD LTD  02/15/2020   IR PTA VENOUS EXCEPT DIALYSIS CIRCUIT  02/15/2020  IR THROMBECT VENO MECH MOD SED  02/13/2020   IR THROMBECT VENO MECH REPT MOD SED  02/15/2020   RADIOLOGY WITH ANESTHESIA N/A 02/13/2020   Procedure: IR WITH ANESTHESIA;  Surgeon: Julieanne Cotton, MD;  Location: [facility] OR;  Service: Radiology;  Laterality: N/A;   RADIOLOGY WITH ANESTHESIA N/A 02/15/2020   Procedure: IR WITH ANESTHESIA;  Surgeon: Radiologist, Medication, MD;  Location: [facility] OR;  Service: Radiology;  Laterality: N/A;   TONSILLECTOMY     WISDOM TOOTH EXTRACTION      Family History  Problem Relation Age of Onset   Sickle cell trait Mother    Depression Mother    Asthma Father    Drug abuse Father        heroin abuse   Kidney disease Father        due to heroin abuse    Diabetes Maternal Grandmother    Sleep apnea Neg Hx    Snoring Neg Hx     Allergies as of 04/19/2023   (No Known Allergies)    Social History   Socioeconomic History   Marital status: Single    Spouse name: Not on file   Number of children: 2   Years of education: Not on file   Highest education level: Not on file  Occupational History   Not on file  Tobacco Use   Smoking status: Former   Smokeless tobacco: Never  Substance and Sexual Activity   Alcohol use: No   Drug use: No   Sexual activity: Yes    Birth control/protection: None, Condom, Abstinence    Comment: h/o implant, depo  Other Topics Concern   Not on file  Social History Narrative   ** Merged History Encounter **       Lives with mother and her 2 kids Left Handed None    Social Drivers of Health   Financial Resource Strain: Not on file  Food Insecurity: Not on file  Transportation Needs: Not on file  Physical Activity: Not on file  Stress: Not on file  Social Connections: Unknown (06/04/2021)   Received from [facility], [facility]   Social Network    Social Network: Not on file     Review of Systems   Gen: Denies fever, chills, anorexia. Denies fatigue, weakness, weight loss.  CV: Denies chest pain, palpitations, syncope, peripheral edema, and claudication. Resp: Denies dyspnea at rest, cough, wheezing, coughing up blood, and pleurisy. GI: See HPI Derm: Denies rash, itching, dry skin Psych: Denies depression, anxiety, memory loss, confusion. No homicidal or suicidal ideation.  Heme: Denies bruising, bleeding, and enlarged lymph nodes.  Physical Exam   LMP 03/07/2023   General:   Alert and oriented. No distress noted. Pleasant and cooperative.  Head:  Normocephalic and atraumatic. Eyes:  Conjuctiva clear without scleral icterus. Mouth:  Oral mucosa pink and moist. Good dentition. No lesions. Lungs:  Clear to auscultation bilaterally. No wheezes, rales, or rhonchi. No distress.   Heart:  S1, S2 present without murmurs appreciated.  Abdomen:  +BS, soft, non-tender and non-distended. No rebound or guarding. No HSM or masses noted. Rectal: *** Msk:  Symmetrical without gross deformities. Normal posture. Extremities:  Without edema. Neurologic:  Alert and  oriented x4 Psych:  Alert and cooperative. Normal mood and affect.  Assessment  Jill Clarke is a 38 y.o. female with a history of *** presenting today with    PLAN   ***     Brooke Bonito, MSN, FNP-BC, AGACNP-BC [facility]

## 2023-07-20 ENCOUNTER — Encounter (HOSPITAL_COMMUNITY): Payer: Self-pay | Admitting: Interventional Radiology
